# Patient Record
Sex: Female | Born: 1935
Health system: Southern US, Community
[De-identification: ages and names within clinical notes are randomized; demographics above are authoritative.]

## PROBLEM LIST (undated history)

## (undated) ENCOUNTER — Emergency Department (HOSPITAL_COMMUNITY): Admission: EM | Payer: Medicare Other | Source: Home / Self Care

## (undated) DIAGNOSIS — Z950 Presence of cardiac pacemaker: Secondary | ICD-10-CM

## (undated) DIAGNOSIS — I447 Left bundle-branch block, unspecified: Secondary | ICD-10-CM

## (undated) DIAGNOSIS — C50919 Malignant neoplasm of unspecified site of unspecified female breast: Secondary | ICD-10-CM

## (undated) DIAGNOSIS — I8390 Asymptomatic varicose veins of unspecified lower extremity: Secondary | ICD-10-CM

## (undated) DIAGNOSIS — H9190 Unspecified hearing loss, unspecified ear: Secondary | ICD-10-CM

## (undated) DIAGNOSIS — L0390 Cellulitis, unspecified: Secondary | ICD-10-CM

## (undated) DIAGNOSIS — R197 Diarrhea, unspecified: Secondary | ICD-10-CM

## (undated) DIAGNOSIS — E785 Hyperlipidemia, unspecified: Secondary | ICD-10-CM

## (undated) DIAGNOSIS — I509 Heart failure, unspecified: Secondary | ICD-10-CM

## (undated) DIAGNOSIS — I255 Ischemic cardiomyopathy: Secondary | ICD-10-CM

## (undated) DIAGNOSIS — I10 Essential (primary) hypertension: Secondary | ICD-10-CM

## (undated) DIAGNOSIS — R194 Change in bowel habit: Secondary | ICD-10-CM

## (undated) DIAGNOSIS — K635 Polyp of colon: Secondary | ICD-10-CM

## (undated) DIAGNOSIS — T82198A Other mechanical complication of other cardiac electronic device, initial encounter: Secondary | ICD-10-CM

## (undated) DIAGNOSIS — Z95810 Presence of automatic (implantable) cardiac defibrillator: Secondary | ICD-10-CM

## (undated) HISTORY — PX: COLONOSCOPY: SHX174

## (undated) HISTORY — DX: Malignant neoplasm of unspecified site of unspecified female breast: C50.919

## (undated) HISTORY — DX: Hyperlipidemia, unspecified: E78.5

## (undated) HISTORY — DX: Polyp of colon: K63.5

## (undated) HISTORY — DX: Presence of automatic (implantable) cardiac defibrillator: Z95.810

## (undated) HISTORY — DX: Cellulitis, unspecified: L03.90

## (undated) HISTORY — PX: ABDOMINAL HYSTERECTOMY: SHX81

## (undated) HISTORY — DX: Other mechanical complication of other cardiac electronic device, initial encounter: T82.198A

## (undated) HISTORY — PX: CHOLECYSTECTOMY: SHX55

## (undated) HISTORY — PX: TONSILLECTOMY AND ADENOIDECTOMY: SUR1326

## (undated) HISTORY — PX: OTHER SURGICAL HISTORY: SHX169

## (undated) HISTORY — DX: Diarrhea, unspecified: R19.7

## (undated) HISTORY — DX: Change in bowel habit: R19.4

## (undated) HISTORY — DX: Left bundle-branch block, unspecified: I44.7

## (undated) HISTORY — DX: Essential (primary) hypertension: I10

## (undated) HISTORY — DX: Ischemic cardiomyopathy: I25.5

---

## 1998-03-17 ENCOUNTER — Other Ambulatory Visit: Admission: RE | Admit: 1998-03-17 | Discharge: 1998-03-17 | Payer: Self-pay | Admitting: Cardiology

## 1998-10-03 ENCOUNTER — Ambulatory Visit (HOSPITAL_COMMUNITY): Admission: RE | Admit: 1998-10-03 | Discharge: 1998-10-03 | Payer: Self-pay | Admitting: Ophthalmology

## 1999-08-24 ENCOUNTER — Encounter (HOSPITAL_COMMUNITY): Admission: RE | Admit: 1999-08-24 | Discharge: 1999-11-22 | Payer: Self-pay | Admitting: Cardiology

## 1999-11-23 ENCOUNTER — Encounter (HOSPITAL_COMMUNITY): Admission: RE | Admit: 1999-11-23 | Discharge: 2000-02-21 | Payer: Self-pay | Admitting: Cardiology

## 2000-02-22 ENCOUNTER — Encounter (HOSPITAL_COMMUNITY): Admission: RE | Admit: 2000-02-22 | Discharge: 2000-05-22 | Payer: Self-pay | Admitting: Cardiology

## 2000-05-23 ENCOUNTER — Encounter (HOSPITAL_COMMUNITY): Admission: RE | Admit: 2000-05-23 | Discharge: 2000-08-21 | Payer: Self-pay | Admitting: Cardiology

## 2000-08-22 ENCOUNTER — Encounter (HOSPITAL_COMMUNITY): Admission: RE | Admit: 2000-08-22 | Discharge: 2000-11-20 | Payer: Self-pay | Admitting: Cardiology

## 2000-11-21 ENCOUNTER — Encounter (HOSPITAL_COMMUNITY): Admission: RE | Admit: 2000-11-21 | Discharge: 2001-02-19 | Payer: Self-pay | Admitting: Cardiology

## 2001-02-20 ENCOUNTER — Encounter (HOSPITAL_COMMUNITY): Admission: RE | Admit: 2001-02-20 | Discharge: 2001-05-21 | Payer: Self-pay | Admitting: Cardiology

## 2001-03-29 ENCOUNTER — Encounter: Admission: RE | Admit: 2001-03-29 | Discharge: 2001-06-27 | Payer: Self-pay | Admitting: Internal Medicine

## 2001-05-22 ENCOUNTER — Encounter (HOSPITAL_COMMUNITY): Admission: RE | Admit: 2001-05-22 | Discharge: 2001-08-20 | Payer: Self-pay | Admitting: Cardiology

## 2001-09-01 ENCOUNTER — Encounter (HOSPITAL_COMMUNITY): Admission: RE | Admit: 2001-09-01 | Discharge: 2001-11-30 | Payer: Self-pay | Admitting: Cardiology

## 2001-11-06 ENCOUNTER — Ambulatory Visit (HOSPITAL_COMMUNITY): Admission: RE | Admit: 2001-11-06 | Discharge: 2001-11-06 | Payer: Self-pay | Admitting: Ophthalmology

## 2001-11-06 ENCOUNTER — Encounter: Payer: Self-pay | Admitting: Ophthalmology

## 2001-12-02 ENCOUNTER — Encounter (HOSPITAL_COMMUNITY): Admission: RE | Admit: 2001-12-02 | Discharge: 2002-03-02 | Payer: Self-pay | Admitting: Cardiology

## 2002-03-03 ENCOUNTER — Encounter (HOSPITAL_COMMUNITY): Admission: RE | Admit: 2002-03-03 | Discharge: 2002-06-01 | Payer: Self-pay | Admitting: Cardiology

## 2002-08-07 ENCOUNTER — Ambulatory Visit (HOSPITAL_COMMUNITY): Admission: RE | Admit: 2002-08-07 | Discharge: 2002-08-08 | Payer: Self-pay | Admitting: Cardiology

## 2002-08-08 ENCOUNTER — Encounter: Payer: Self-pay | Admitting: Cardiology

## 2002-12-03 ENCOUNTER — Encounter: Payer: Self-pay | Admitting: Cardiology

## 2002-12-03 ENCOUNTER — Inpatient Hospital Stay (HOSPITAL_COMMUNITY): Admission: AD | Admit: 2002-12-03 | Discharge: 2002-12-12 | Payer: Self-pay | Admitting: Cardiology

## 2002-12-05 ENCOUNTER — Encounter: Payer: Self-pay | Admitting: Cardiology

## 2002-12-06 ENCOUNTER — Encounter: Payer: Self-pay | Admitting: Cardiology

## 2002-12-10 ENCOUNTER — Encounter: Payer: Self-pay | Admitting: Cardiology

## 2003-02-03 ENCOUNTER — Encounter: Payer: Self-pay | Admitting: Internal Medicine

## 2003-02-03 ENCOUNTER — Inpatient Hospital Stay (HOSPITAL_COMMUNITY): Admission: EM | Admit: 2003-02-03 | Discharge: 2003-02-05 | Payer: Self-pay | Admitting: Emergency Medicine

## 2004-10-06 ENCOUNTER — Ambulatory Visit: Payer: Self-pay | Admitting: Internal Medicine

## 2004-11-01 DIAGNOSIS — I428 Other cardiomyopathies: Secondary | ICD-10-CM

## 2004-11-01 HISTORY — DX: Other cardiomyopathies: I42.8

## 2004-11-11 ENCOUNTER — Inpatient Hospital Stay (HOSPITAL_BASED_OUTPATIENT_CLINIC_OR_DEPARTMENT_OTHER): Admission: RE | Admit: 2004-11-11 | Discharge: 2004-11-11 | Payer: Self-pay | Admitting: Cardiology

## 2004-11-25 ENCOUNTER — Ambulatory Visit: Payer: Self-pay | Admitting: Internal Medicine

## 2004-12-03 ENCOUNTER — Ambulatory Visit (HOSPITAL_COMMUNITY): Admission: RE | Admit: 2004-12-03 | Discharge: 2004-12-03 | Payer: Self-pay | Admitting: Internal Medicine

## 2004-12-22 ENCOUNTER — Ambulatory Visit: Payer: Self-pay | Admitting: Internal Medicine

## 2004-12-28 ENCOUNTER — Inpatient Hospital Stay (HOSPITAL_COMMUNITY): Admission: RE | Admit: 2004-12-28 | Discharge: 2004-12-29 | Payer: Self-pay | Admitting: Internal Medicine

## 2004-12-28 ENCOUNTER — Ambulatory Visit: Payer: Self-pay | Admitting: Internal Medicine

## 2004-12-30 ENCOUNTER — Ambulatory Visit: Payer: Self-pay

## 2005-01-01 ENCOUNTER — Ambulatory Visit: Payer: Self-pay | Admitting: Cardiology

## 2005-01-11 ENCOUNTER — Ambulatory Visit: Payer: Self-pay | Admitting: Internal Medicine

## 2005-01-11 ENCOUNTER — Ambulatory Visit: Payer: Self-pay

## 2005-02-03 ENCOUNTER — Ambulatory Visit: Payer: Self-pay

## 2005-02-16 ENCOUNTER — Ambulatory Visit: Payer: Self-pay | Admitting: Internal Medicine

## 2005-03-24 ENCOUNTER — Ambulatory Visit: Payer: Self-pay | Admitting: Internal Medicine

## 2005-04-30 ENCOUNTER — Ambulatory Visit: Payer: Self-pay | Admitting: Internal Medicine

## 2005-08-05 ENCOUNTER — Encounter (INDEPENDENT_AMBULATORY_CARE_PROVIDER_SITE_OTHER): Payer: Self-pay | Admitting: Specialist

## 2005-08-05 ENCOUNTER — Ambulatory Visit (HOSPITAL_COMMUNITY): Admission: RE | Admit: 2005-08-05 | Discharge: 2005-08-05 | Payer: Self-pay | Admitting: Gastroenterology

## 2005-12-10 ENCOUNTER — Ambulatory Visit: Payer: Self-pay | Admitting: Internal Medicine

## 2005-12-20 ENCOUNTER — Ambulatory Visit: Payer: Self-pay

## 2005-12-27 ENCOUNTER — Ambulatory Visit: Payer: Self-pay | Admitting: Internal Medicine

## 2005-12-27 ENCOUNTER — Inpatient Hospital Stay (HOSPITAL_COMMUNITY): Admission: RE | Admit: 2005-12-27 | Discharge: 2005-12-27 | Payer: Self-pay | Admitting: Internal Medicine

## 2006-01-05 ENCOUNTER — Ambulatory Visit: Payer: Self-pay

## 2006-05-10 ENCOUNTER — Ambulatory Visit: Payer: Self-pay | Admitting: Internal Medicine

## 2007-05-02 ENCOUNTER — Ambulatory Visit: Payer: Self-pay | Admitting: Internal Medicine

## 2007-08-10 ENCOUNTER — Ambulatory Visit: Payer: Self-pay | Admitting: Internal Medicine

## 2008-04-12 ENCOUNTER — Encounter: Payer: Self-pay | Admitting: Cardiology

## 2008-04-19 ENCOUNTER — Ambulatory Visit: Payer: Self-pay | Admitting: Internal Medicine

## 2008-04-19 LAB — CONVERTED CEMR LAB
BUN: 24 mg/dL — ABNORMAL HIGH (ref 6–23)
Basophils Relative: 0.3 % (ref 0.0–1.0)
CO2: 27 meq/L (ref 19–32)
Creatinine, Ser: 0.8 mg/dL (ref 0.4–1.2)
Eosinophils Relative: 3.8 % (ref 0.0–5.0)
GFR calc Af Amer: 89 mL/min
GFR calc non Af Amer: 74 mL/min
Glucose, Bld: 149 mg/dL — ABNORMAL HIGH (ref 70–99)
HCT: 37 % (ref 36.0–46.0)
Hemoglobin: 13 g/dL (ref 12.0–15.0)
MCHC: 35 g/dL (ref 30.0–36.0)
MCV: 92.7 fL (ref 78.0–100.0)
Monocytes Absolute: 0.4 10*3/uL (ref 0.1–1.0)
Neutrophils Relative %: 67.7 % (ref 43.0–77.0)
Platelets: 210 10*3/uL (ref 150–400)
aPTT: 29.3 s (ref 21.7–29.8)

## 2008-04-30 ENCOUNTER — Ambulatory Visit (HOSPITAL_COMMUNITY): Admission: AD | Admit: 2008-04-30 | Discharge: 2008-04-30 | Payer: Self-pay | Admitting: Internal Medicine

## 2008-04-30 ENCOUNTER — Ambulatory Visit: Payer: Self-pay | Admitting: Internal Medicine

## 2008-05-22 ENCOUNTER — Ambulatory Visit: Payer: Self-pay

## 2008-08-22 ENCOUNTER — Ambulatory Visit: Payer: Self-pay | Admitting: Internal Medicine

## 2008-08-22 HISTORY — PX: OTHER SURGICAL HISTORY: SHX169

## 2009-03-18 ENCOUNTER — Encounter: Payer: Self-pay | Admitting: Internal Medicine

## 2010-08-17 ENCOUNTER — Ambulatory Visit: Payer: Self-pay | Admitting: Cardiology

## 2010-11-20 ENCOUNTER — Ambulatory Visit: Payer: Self-pay | Admitting: Cardiology

## 2010-12-01 ENCOUNTER — Telehealth (INDEPENDENT_AMBULATORY_CARE_PROVIDER_SITE_OTHER): Payer: Self-pay | Admitting: *Deleted

## 2010-12-01 ENCOUNTER — Ambulatory Visit
Admission: RE | Admit: 2010-12-01 | Discharge: 2010-12-01 | Payer: Self-pay | Source: Home / Self Care | Attending: Internal Medicine | Admitting: Internal Medicine

## 2010-12-01 DIAGNOSIS — I5022 Chronic systolic (congestive) heart failure: Secondary | ICD-10-CM | POA: Insufficient documentation

## 2010-12-01 DIAGNOSIS — I428 Other cardiomyopathies: Secondary | ICD-10-CM | POA: Insufficient documentation

## 2010-12-09 NOTE — Progress Notes (Signed)
  Phone Note Call from Patient   Caller: Patient Call For: NURSE Summary of Call: PT CALLED AND CONFIRMED DOSAGE OF AMLODIPINE 5 MG 1 once daily AND STATED OTHER MEDS WERE LISTED CORRECTLY Initial call taken by: Scherrie Bateman, LPN,  December 01, 2010 10:42 AM

## 2010-12-09 NOTE — Assessment & Plan Note (Signed)
Summary: icd check/medtronic   Visit Type:  Follow-up  CC:  no complaints.  History of Present Illness:   [name]  is seen in followup for nonischemic cardiomyopathy complicated by congestive heart failure. She is status post CRT-D implantation was further compromised by a 6949-lead which failed prompting [place] to pirate  her previously implanted pacemaker lead  . This was done in 2007  She has had a modest change in her breathing, needing to take deep breaths and with a lump in her throat.    The patient denies SOB, chest pain, edema or palpitations   Current Medications (verified): 1)  Janumet 50-500 Mg Tabs (Sitagliptin-Metformin Hcl) .Marland Kitchen.. 1 Tab Twice Daily 2)  Klor-Con 10 10 Meq Cr-Tabs (Potassium Chloride) .... As Needed 3)  Lipitor 20 Mg Tabs (Atorvastatin Calcium) .... Daily 4)  Furosemide 20 Mg Tabs (Furosemide) .... As Needed 5)  Diovan 320 Mg Tabs (Valsartan) .... Daily 6)  Metoprolol Succinate 100 Mg Xr24h-Tab (Metoprolol Succinate) .Marland Kitchen.. 1 Tab By Mouth Twice Daily 7)  Amlodipine Besylate 5 Mg Tabs (Amlodipine Besylate) .... Daily 8)  Gemfibrozil 600 Mg Tabs (Gemfibrozil) .Marland Kitchen.. 1 Tab By Mouth Daily 9)  Mag-Tab Sr 84 Mg ( ) Cr-Tabs (Magnesium Lactate) .... Daily 10)  Aspirin 325 Mg Tabs (Aspirin) .... As Needed 11)  Centrum Silver  Tabs (Multiple Vitamins-Minerals) .... Daily 12)  Hyoscyamine Sulfate 0.125 Mg/73ml Elix (Hyoscyamine Sulfate) .... As Needed 13)  Cholestyramine  Powd (Cholestyramine) .... As Needed 14)  B12 .... Daily  Allergies (verified): 1)  ! Coreg 2)  ! * Antibiotics  Past History:  Past Medical History: Last updated: 12/05/2010 Ischemic Cardiomyopathy Functioning biventricular ICD Diabetes Hypertension Dyslipidemia Ulcerative Colitis Meniere's disease Chronic left bundle branch block  Past Surgical History: Last updated: Dec 05, 2010 Cardiac Catheterization Pacemaker Abdominal Hysterectomy T&A  Family History: Last updated:  12-05-2010 Father: died at age 60 with emphysema Mother: died at age 71 with cancer  Social History: Last updated: 12/05/10 Widowed, has 2 children Tobacco Use - Former. stopped smoking in 1970  Risk Factors: Smoking Status: quit (December 05, 2010)  Vital Signs:  Patient profile:   77 year old female Height:      65 inches Weight:      138.50 pounds BMI:     23.13 Pulse rate:   83 / minute BP sitting:   148 / 81  (right arm) Cuff size:   regular  Vitals Entered By: Micki Riley CNA (December 01, 2010 9:39 AM)  Physical Exam  General:  The patient was alert and oriented in no acute distress. HEENT Normal.  Neck veins were flat, carotids were brisk.  Lungs were clear.  Heart sounds were regular without murmurs or gallops.  Abdomen was soft with active bowel sounds. There is no clubbing cyanosis or edema. Skin Warm and dry     ICD Specifications Following MD:  Sherryl Manges, MD     Referring MD:  Roger Shelter, MD ICD Vendor:  Medtronic     ICD Model Number:  [id number]     ICD Serial Number:  [id number] ICD DOI:  04/30/2008     ICD Implanting MD:  Sherryl Manges, MD  Lead 1:    Location: RA     DOI: 08/07/2002     Model #: [id number]     Serial #: [id number]     Status: active Lead 2:    Location: RV     DOI: 12/28/2004     Model #: [id number]     Serial #:  [id number]     Status: active Lead 3:    Location: RV     DOI: 08/07/2002     Model #: 4470     Serial #: [id number]     Status: active Lead 4:    Location: LV     DOI: 08/07/2002     Model #: [id number]     Serial #: [id number]     Status: active  Indications::  NICM   ICD Follow Up Battery Voltage:  3.06 V     Charge Time:  9.3 seconds     Underlying rhythm:  SR @ 74   ICD Device Measurements Atrium:  Amplitude: 1.6 mV, Impedance: 552 ohms, Threshold: 1.0 V at 0.4 msec Right Ventricle:  Amplitude: 17.6 mV, Impedance: 408 ohms, Threshold: 1.0 V at 0.6 msec Left Ventricle:  Impedance: 456 ohms, Threshold: 1.0 V at 0.4 msec  Episodes MS  Episodes:  0     Shock:  0     ATP:  0     Nonsustained:  8     Atrial Therapies:  0 Atrial Pacing:  10%     Ventricular Pacing:  99.9%  Brady Parameters Mode DDDR     Lower Rate Limit:  60     Upper Rate Limit 120 PAV 130     Sensed AV Delay:  110  Tachy Zones VF:  >200     VT:  200-250     VT1:  130-200     Next Remote Date:  03/04/2011     Next Cardiology Appt Due:  11/02/2011 Tech Comments:  8 NST EPISODES.  NORMAL DEVICE FUNCTION.  CHANGED RV OUTPUT FROM 3.0 TO 2.5 V AND TURNED ON 1:1 SVT.  CHANGED MAX LEAD IMPEDANCES FOR LIA. OPTIVOL STABLE. DEMONSTRATED TONES FOR PT. CARELINK 03-04-11 AND ROV IN 12 MTHS W/SK. Vella Kohler  December 01, 2010 9:53 AM  Impression & Recommendations:  Problem # 1:  CARDIOMYOPATHY, PRIMARY, DILATED (ICD-425.4)  stable on current medications  Her updated medication list for this problem includes:    Furosemide 20 Mg Tabs (Furosemide) .Marland Kitchen... As needed    Diovan 320 Mg Tabs (Valsartan) .Marland Kitchen... Daily    Metoprolol Succinate 100 Mg Xr24h-tab (Metoprolol succinate) .Marland Kitchen... 1 tab by mouth twice daily    Amlodipine Besylate 5 Mg Tabs (Amlodipine besylate) .Marland Kitchen... Daily    Aspirin 325 Mg Tabs (Aspirin) .Marland Kitchen... As needed  Problem # 2:  SYSTOLIC HEART FAILURE, CHRONIC (ICD-428.22)  continue current medications; she is advised to let us know if the breathing issues get worse. There is a small increase in her optivol reading but has not crossed threshold. We will resume her p.r.n. diuretic in the event that it does so.  Her updated medication list for this problem includes:    Furosemide 20 Mg Tabs (Furosemide) .Marland Kitchen... As needed    Diovan 320 Mg Tabs (Valsartan) .Marland Kitchen... Daily    Metoprolol Succinate 100 Mg Xr24h-tab (Metoprolol succinate) .Marland Kitchen... 1 tab by mouth twice daily    Amlodipine Besylate 5 Mg Tabs (Amlodipine besylate) .Marland Kitchen... Daily    Aspirin 325 Mg Tabs (Aspirin) .Marland Kitchen... As needed  Problem # 3:  MEDTRONIC CRT (ICD-V45.02) Device parameters and data were reviewed and  no changes were made  Problem # 4:  6949 WITH PIRATED PM R/S LEAD; RETAINED HV COILS (ICD-996.04) she is reminded to alert [place] if her device starts beeping. The lead integrity alert will advise [place] if she has structural issues with the retained portiona of  high voltage system of her 6949-lead.  Patient Instructions: 1)  Your physician wants you to follow-up in:  YEAR WITH DR Graciela Husbands AND 03/04/11 WITH Melburn Hake You will receive a reminder letter in the mail two months in advance. If you don't receive a letter, please call our office to schedule the follow-up appointment.

## 2011-03-04 ENCOUNTER — Ambulatory Visit (INDEPENDENT_AMBULATORY_CARE_PROVIDER_SITE_OTHER): Payer: Medicare PPO | Admitting: *Deleted

## 2011-03-04 DIAGNOSIS — I5022 Chronic systolic (congestive) heart failure: Secondary | ICD-10-CM

## 2011-03-04 DIAGNOSIS — I428 Other cardiomyopathies: Secondary | ICD-10-CM

## 2011-03-07 ENCOUNTER — Other Ambulatory Visit: Payer: Self-pay

## 2011-03-07 ENCOUNTER — Encounter: Payer: Self-pay | Admitting: *Deleted

## 2011-03-11 NOTE — Progress Notes (Signed)
icd remote w/icm  

## 2011-03-16 NOTE — Letter (Signed)
April 19, 2008    Colleen Can. Deborah Chalk, M.D.  [address], [address]  [place], [place] [address]   RE:  Grace Robinson, Grace Robinson  MRN:  [id number]  /  DOB:  07-Nov-1925   Dear Duffy Rhody,   It was a pleasure to see Romonda Parker at your request as she has reached  ERI of her previously implanted ICD.   As you know, she has a remote history of a pacemaker implanted about 4-5  years ago.  She then underwent CRTD upgrade in 2003 with the insertion  of a 6949 Medtronic lead.  We then ended up having failure of the rate  sensed portion and I do not know that we appreciated this early on in  6949 history and we revised her system pivoting her previously implanted  right ventricular rate sense lead and she has done well with that since  then.  She remains class II.  She is able to get around and do most of  what she wants.  She has little bit of limitations of fatigue and  shortness of breath.  As you recall, she has a nonischemic myopathy.  She also has diabetes and hypertension.   Her medication list is legion and includes:  1. Lipitor 20.  2. Diovan 320.  3. Amlodipine 10.  4. Metoprolol succinate 100 b.i.d.  5. Metformin 500 b.i.d.  6. Furosemide.  7. Gemfibrozil.  8. Asacol.  9. Aspirin.   ALLERGIES:  VANCOMYCIN.   On examination, she is a stately elderly female appearing her stated age  of 72.  Her blood pressure is 135/65, her pulse is 68, her weight is  133, which is down a few pounds.  Her HEENT exam demonstrates no icterus  or xanthoma.  The neck veins were flat.  The carotids were brisk and  full bilaterally without bruits.  The back was without kyphosis or  scoliosis.  Lungs were clear.  Heart sounds were regular without murmurs  or gallops.  The abdomen was soft with active bowel sounds.  Extremities  were without edema.  Neurological exam was grossly normal.  The skin was  warm and dry.   Interrogation of her previously implanted Medtronic ICD demonstrates  presence of the  aforementioned Guidant leads of 4469 in the atrium and  4470 in the ventricle.  She has a 4193 CRT lead and a Medtronic InSync  Sentry [id number].  Her battery voltage is 2.62.   IMPRESSION:  1. Nonischemic cardiomyopathy.  2. Chronic systolic congestive heart failure.  3. Status post cardiac resynchronization therapy device and      defibrillator for the above with:      a.     Originally implanted dual-chamber pacemaker.      b.     Implantation of cardiac resynchronization therapy device and       defibrillator.      c.     Fracture of the rate sensed portion of the 6949 lead with       pivoting of the previously implanted RV pacing lead.      d.     Elective replacement indicator.  4. Hypertension.  5. Diabetes.  6. VANCOMYCIN - Redman reaction.   Weyman Croon, Ms. Abplanalp has reached ERI on her CRTD.  We have discussed the  potential benefits as well as potential risks of the device generator  explantation and reimplantation.  She understands these risks including  death, perforation, and the possibility of infection and is willing to  proceed.  We will plan at the time of device generator replacement to  extricate some of the scar tissue based on some information that was  presented at the [facility] meetings this last year.  She understands the  potential risk of infection may be as high as 45%.    Sincerely,      Duke Salvia, MD, [facility]  Electronically Signed    SCK/MedQ  DD: 04/19/2008  DT: 04/19/2008  Job #: [id number]

## 2011-03-16 NOTE — Assessment & Plan Note (Signed)
[facility]                         ELECTROPHYSIOLOGY OFFICE NOTE   CYNDIA, DEGRAFF                       MRN:          [id number]  DATE:05/02/2007                            DOB:          [birth year]    HISTORY:  Ms. Nilson is seen.  She is status post CRT implantation for  non-ischemic heart disease.  She continues to feel very well without  complaints of chest pain or shortness of breath.  She is able to do her  activities of daily living.   CURRENT MEDICATIONS:  1. Toprol 150 mg.  2. Amlodipine 2.5 mg.  3. Metformin 100 mg b.i.d.  4. Diovan 320 mg.  5. Lipitor.   PHYSICAL EXAMINATION:  VITAL SIGNS:  Blood pressure 139/79, pulse 62.  LUNGS:  Clear.  HEART:  Sounds regular.  EXTREMITIES:  Without edema.   Interrogation of her Medtronic Centry ICD demonstrates a P-wave of 1.7  with impedance of 496, threshold 1 volt at 0.2.  The R-wave 20.2,  impedance 360, threshold of 1.5 at 0.3.  LV impedance was 392, threshold  of 1 volt at 0.2.  Battery voltage 2.9.  There are no inter-current  therapies.  There are a number of non-sustained episodes.  She is 100%  ventricularly paced.   IMPRESSION:  1. Non-ischemic cardiomyopathy.  2. Class 3, now class 2 congestive heart failure.  3. Status post CRT implantable cardioverter defibrillator for number      one.  4. Exit block related to previously-implanted defibrillator rate sense      portion with pirating of her previously-implanted pacemaker lead.  5. Is #6949 lead.   Ms. Miles is doing well.  We have demonstrated for her, her tones for  alarm, in case her [phone] has a problem.  We will see her again in one  year's time.  She will continue trans-telephonic monitoring in the  interim.     Duke Salvia, MD, [facility]  Electronically Signed    SCK/MedQ  DD: 05/02/2007  DT: 05/02/2007  Job #: [id number]   cc:   Colleen Can. Deborah Chalk, M.D.

## 2011-03-16 NOTE — Assessment & Plan Note (Signed)
[facility]                         ELECTROPHYSIOLOGY OFFICE NOTE   TERESINA, BUGAJ                       MRN:          [id number]  DATE:08/22/2008                            DOB:          Nov 09, 1925    [name] is seen following CRT defibrillator generator replacement  accomplished in June.  She does have a pirated previously implanted  ventricular paced sense lead as her 6949 lead had problems about 3 years  ago.   She has no complaints of chest pain or shortness of breath.   Her medications are unchanged, except for the intercurrent addition of  med Carafate.   PHYSICAL EXAMINATION:  VITAL SIGNS:  Her blood pressure is 120/70, her  pulse was 70, and weight was 131, stable.  LUNGS:  Clear.  NECK:  Veins were flat.  HEART:  Sounds were regular.  Device pocket was well healed.  EXTREMITIES:  No edema.   Interrogation of her Medtronic device demonstrates a P-wave of 1.9 with  impedance of 40, the threshold 1 volt at 0.4 in the RA and LV, the RV  impedance was 384, the threshold 0.5 at 0.4, the LV impedance was 416,  and the R-wave was 19.  Battery voltage is 3.2.  There is no  intercurrent episodes.  There is 2 episodes of nonsustained ventricular  tachycardia identified.  No therapies were delivered.   IMPRESSION:  1. Nonischemic cardiomyopathy.  2. Congestive heart failure - chronic - systolic.  3. Status post cardiac resynchronization therapy device and      defibrillator for the above with recent generator replacement.  4. Previous pirating of the rate sense lead for the 6949 lead.   [name] is stable.  We will see her again at the request of Dr.  Reyes Robinson and Grace Robinson.  No followup will be scheduled.     Duke Salvia, MD, [facility]  Electronically Signed    SCK/MedQ  DD: 08/22/2008  DT: 08/22/2008  Job #: [id number]   cc:   Grace Robinson. Grace Robinson, M.D.

## 2011-03-19 NOTE — Op Note (Signed)
[place]  Patient:    Grace Robinson, Grace Robinson Visit Number: [id number] MRN: [id number]          Service Type: DSU Location: [facility] 2857 01 Attending Physician:  Ivor Messier Dictated by:   Guadelupe Sabin, M.D. Proc. Date: 11/07/01 Admit Date:  11/06/2001 Discharge Date: 11/06/2001   CC:         Gordy Savers, M.D. [facility] Deborah Chalk, M.D.   Operative Report  PREOPERATIVE DIAGNOSIS:  Senile nuclear cataract, right eye.  POSTOPERATIVE DIAGNOSIS:  Senile nuclear cataract, right eye.  OPERATIONS: 1. Planned extracapsular cataract extraction. 2. Phacoemulsification. 3. Primary insertion of posterior chamber intraocular lens implant.  SURGEON:  Guadelupe Sabin, M.D.  ASSISTANT:  Nurse.  ANESTHESIA:  Local 4% Xylocaine and 0.75 Marcaine.  Anesthesia standby required.  The patient was given sodium pentothal intravenously during the period of retrobulbar injection.  DESCRIPTION OF PROCEDURE:  After the patient was prepped and draped, a lid speculum was inserted in the left eye.  The eye was turned downward and a superior rectus traction suture placed.  Schiotz tonometry was recorded at 5-6 scale units with a 5.5 g weight.  A peritomy was performed adjacent to the limbus from the 11 to 1 oclock position.  The corneoscleral junction was cleaned and a corneoscleral groove made with a 45 degree Superblade.  The anterior chamber was then entered with a 2.5 mm diamond keratome at the 12 oclock position and the 15 degree blade at the 2:30 position.  Using a bent 26 gauge needle on a Healon syringe, a circular capsulorrhexis was begun and then completed with the Grabow forceps.  Hydrodissection and hydrodelineation were performed using 1% Xylocaine.  The 30 degree phacoemulsification tip was then inserted with slow controlled emulsification of the lens nucleus.  Total ultrasonic time 44 seconds.  Average power level 16%.  Total  amount of fluid 75 cc.  Following removal of the nucleus, the residual cortex was aspirated with the irrigation-aspiration tip.  The posterior capsule appeared intact with a brilliant red fundus reflex.  It was therefore elected to insert an [employer] Opt[employer] [id number] silicone three-piece posterior chamber intraocular lens implant with UV absorber, diopter strength +20.00.  This was inserted with the McDonald forceps into the anterior chamber and then centered into the capsular bag using the [facility] lens rotator.  The lens appeared to be well centered.  The Healon which had been using during the procedure was then aspirated and replaced with balanced salt solution and Miochol ophthalmic solution.  The operative incisions appeared to be self-sealing and no sutures were required.  Maxitrol ointment was instilled in the conjunctival cul-de-sac and a light patch and protective shield applied.  The duration of the procedure and anesthesia administration was 45 minutes.  The patient tolerated the procedure well in general and left the operating room for the recovery room in good condition. Dictated by:   Guadelupe Sabin, M.D. Attending Physician:  Ivor Messier DD:  11/07/01 TD:  11/07/01 Job: [id number]

## 2011-03-19 NOTE — H&P (Signed)
[place]  Patient:    Grace Robinson, Grace Robinson Visit Number: [id number] MRN: [id number]          Service Type: DSU Location: [facility] 2857 01 Attending Physician:  Ivor Messier Dictated by:   Guadelupe Sabin, M.D. Admit Date:  11/06/2001 Discharge Date: 11/06/2001   CC:         Gordy Savers, M.D. [facility]. Deborah Chalk, M.D.   History and Physical  HISTORY OF PRESENT ILLNESS:  This was a planned outpatient readmission of this 77 year old white female admitted for cataract implant surgery of the right eye.  This patient has noted deterioration of vision due to progressive cataract formation in both eyes.  She was previously admitted on October 03, 1989, and had an uncomplicated cataract implant procedure performed on the left eye. Recently, however, the patient has deteriorated in the right eye to 20/70 with best correction and the patient has elected to proceed with similar surgery of the right eye.  PAST MEDICAL HISTORY:  The patient is under the care of Gordy Savers, M.D., for her general medical health and Colleen Can. Deborah Chalk, M.D., for cardiology.  She has chronic congestive heart failure which is under control at the present time.  The patient also has Menieres disease with some residual vertigo.  The patient is a widow.  She lives alone.  She is a nonsmoker.  She is diabetic, non-insulin-dependent type, which was diagnosed in May of 2002.  CURRENT MEDICATIONS: 1. Aspirin for one week which she has discontinued for one week prior to    admission. 2. Lanoxin. 3. Zestril. 4. GlucoVance. 5. Lipitor. 6. Vitamins.  REVIEW OF SYSTEMS:  No current cardiorespiratory complaints.  PHYSICAL EXAMINATION:  Vital signs as recorded on admission, temperature 97.8 degrees, pulse 71, respirations 18, and blood pressure 137/73.  GENERAL APPEARANCE:  The patient is a pleasant, well-nourished, well-developed, white female in no  acute ocular distress.  HEENT:  Eyes:  Visual acuity as noted above.  Applanation tonometry 14 mm in each eye.  On external ocular and slit lamp examination, the eyes are white and clear with a clear cornea, deep and clear anterior chamber.  A nuclear sclerotic cataract is present in the right eye and a well-centered, clear posterior chamber intraocular lens implant in the left eye with an open posterior capsule following YAG laser capsulotomy.  Detailed fundus examination with indirect ophthalmoscopy reveals a clear vitreous and attached retina.  The blood vessels show no diabetic retinopathy, but the macula and optic nerve are normal.  CHEST:  Lungs clear to auscultation and percussion.  HEART:  Normal sinus rhythm.  No cardiomegaly.  No murmurs.  ABDOMEN:  Negative.  EXTREMITIES:  Negative.  ADMISSION DIAGNOSES: 1. Senile cataract, right eye. 2. Pseudophakia, left eye.  SURGICAL PLAN:  Cataract implant surgery, right eye, under local anesthesia. Dictated by:   Guadelupe Sabin, M.D. Attending Physician:  Ivor Messier DD:  11/07/01 TD:  11/07/01 Job: [id number]

## 2011-03-19 NOTE — Op Note (Signed)
NAMEDENELDA, AKERLEY                ACCOUNT NO.:  [id number]   MEDICAL RECORD NO.:  [id number]          PATIENT TYPE:  AMB   LOCATION:  ENDO                         FACILITY:  [facility]   PHYSICIAN:  James L. Malon Kindle., M.D.DATE OF BIRTH:  09-Jun-1926   DATE OF PROCEDURE:  08/05/2005  DATE OF DISCHARGE:                                 OPERATIVE REPORT   PROCEDURE:  Colonoscopy with biopsy.   MEDICATIONS:  Fentanyl 65 mcg, Versed 6 mg IV.   SCOPE:  Olympus pediatric colonoscope.   INDICATIONS:  A 77 year old woman with no previous history of colonic  screening.  She has had persistent diarrhea. This is to evaluate her colon.   DESCRIPTION OF PROCEDURE:  The procedure had been explained to the patient  and consent obtained.  She has an implantable defibrillator.  The  defibrillator was disabled by the repair prior to the procedure.  The scope  was inserted and advanced.  She had a long, tortuous colon.  The prep was  excellent.  Using multiple position changes including some supine and right  lateral decubitus positions and abdominal pressure, we were finally able to  reach the cecum.  The appendiceal orifice was seen and identified.  The  scope was withdrawn and the colon carefully examined. The mucosal pattern  throughout was totally normal with no ulceration and no gross colitis.  No  polyps were seen.  There was no diverticulosis.  She did have a quite long,  tortuous colon.  The scope was withdrawn. The patient tolerated the  procedure well. There were no immediate complications.   ASSESSMENT:  Diarrhea, probably functional. 674.5.   PLAN:  Will need to check pathology to rule out microscopic colitis.  Will  see back in the office in 2-4 weeks.  Go ahead and turn on the defibrillator  at this time.           ______________________________  Llana Aliment Malon Kindle., M.D.     Waldron Session  D:  08/05/2005  T:  08/05/2005  Job:  [id number]   cc:   Colleen Can. Deborah Chalk, M.D.  Fax:  [phone]

## 2011-03-19 NOTE — H&P (Signed)
NAME:  Grace Robinson, Grace Robinson                          ACCOUNT NO.:  [id number]   MEDICAL RECORD NO.:  [id number]                   PATIENT TYPE:  EMS   LOCATION:  ED                                   FACILITY:  [facility]   PHYSICIAN:  Valetta Mole. Swords, M.D. [facility]           DATE OF BIRTH:  08-11-1926   DATE OF ADMISSION:  02/03/2003  DATE OF DISCHARGE:                                HISTORY & PHYSICAL   CHIEF COMPLAINT:  Facial pain.   HISTORY OF PRESENT ILLNESS:  The patient is a 77 year old female with a  complicated medical history.  Three days ago she noted a sore area on her  left scalp, tenderness increased over the next few days.  She noticed a  pimple on her forehead yesterday, was [facility] and  apparently treated with Keflex.  Overnight the area of erythema and pain has  increased.  She went back to the Urgent Care Center and was referred to me  and I asked her to come to the emergency department.  She denies any fevers  or chills.  She denies any nasal pain, any eye pain.  She denies any change  in vision.  She denies any jaw pain, rhinorrhea, or cough.   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  She is on long-term prednisone because of that.  She is also a  type 2 diabetic.  She also has a history of pericarditis related to her  viral cardiomyopathy.   PAST SURGICAL HISTORY:  Includes cholecystectomy, T&A, permanent pacemaker  placement, mastoid surgery (some sort of shunt was placed into the inner  ear.   FAMILY HISTORY:  Noncontributory.   SOCIAL HISTORY:  She lives alone.  She is a nonsmoker, does not drink  alcohol.   CURRENT MEDICATIONS:  1. Prednisone 10 mg p.o. daily.  2. Toprol-XL 25 mg p.o. daily.  3. Lanoxin 0.125 mg p.o. daily (exact dose not quite known).  4. Gemfibrozil 600 mg p.o. b.i.d.  5. Glucovance 5/500 mg one p.o. b.i.d.  6. Aspirin one daily.  7. Diovan 150 mg p.o. daily.   REVIEW OF  SYSTEMS:  She has noted some facial swelling extending to the  periorbital area along with erythema.  She denies any other complaints in  the review of systems including chest pain, shortness of breath, PND,  abdominal pain.   PHYSICAL EXAMINATION:  VITAL SIGNS:  Temperature 98, pulse 70, respirations  14.  GENERAL:  She appears a well-developed, thin white female in no acute  distress.  HEENT:  Atraumatic, normocephalic.  She has a boil upper left forehead.  There is some spontaneous purulent drainage from the boil.  There is an area  fluctuant that appears approximately 2 cm around the boil.  Erythema extends  from mid scalp down to bilateral periorbital areas.  NECK:  Supple without lymphadenopathy,  thyromegaly, jugular venous  distention, or carotid bruits.  CHEST:  Clear to auscultation.  CARDIAC:  S1 & S2 are regular rate without gallop.  She does have a 2/6  holosystolic murmur at the apex.  ABDOMEN:  Active bowel sounds, soft, nontender.  There is no  hepatosplenomegaly.  EXTREMITIES:  There is no clubbing, cyanosis, or edema.  NEUROLOGICAL:  She is alert and oriented without any motor or sensory  deficits.   LABORATORY DATA:  None.   ASSESSMENT AND PLAN:  1. Complicated patient with recent onset boil left forehead refractory to 24     hours of oral antibiotics.  Area of erythema is much worse and I am     concerned that there is some fluctuance.  At the bedside, I did stick a     needle into the area after it was prepped and draped in a sterile     fashion.  Approximately 1 mL of purulent material is obtained and that     was sent for a culture in a culture tube.  She clearly needs further     evaluation.  I have talked with Carolan Shiver, M.D. of ENT.  He is going     to come see the patient.  She needs broad-spectrum antibiotics and I will     start those.  I am also concerned for the rapid spread of this cellulitis     and will obtain a CT of the head along with  paranasal sinuses.  2. Multiple other medical problems including long-term steroid use.  I will     increase prednisone to 20 mg p.o. daily.                                               Bruce Rexene Edison Swords, M.D. [facility]    BHS/MEDQ  D:  02/03/2003  T:  02/03/2003  Job:  [id number]   cc:   Gordy Savers, M.D. [facility]

## 2011-03-19 NOTE — Op Note (Signed)
NAME:  Grace Robinson, Grace Robinson                          ACCOUNT NO.:  [id number]   MEDICAL RECORD NO.:  [id number]                   PATIENT TYPE:  OIB   LOCATION:  2030                                 FACILITY:  [facility]   PHYSICIAN:  Colleen Can. Deborah Chalk, M.D.            DATE OF BIRTH:  [birth year]   DATE OF PROCEDURE:  08/07/2002  DATE OF DISCHARGE:  08/08/2002                                 OPERATIVE REPORT   INDICATIONS FOR PROCEDURE:  Left ventricular dysfunction, congestive heart  failure, wide QRS configuration.   PROCEDURE:  Implantation of a biventricular pacemaker with atrial,  ventricular and left ventricular coronary sinus lead under fluoroscopy.   DESCRIPTION OF PROCEDURE:  The left subclavicular area was prepped and  draped.  A subcutaneous pocket was created.  Three  punctures were made into the subclavian vein medial to the first rib.  Using  7 French Cook introducer, the right ventricular lead was introduced.  The  right ventricular lead was a Guidant, model 4470 active fixation lead,  serial [id number].  The coronary sinus catheter was then introduced and  positioned. We then returned and placed the right atrial lead. The right  atrial lead was a Guidant atrial active fixation lead, model 4469, serial  [id number].  We then returned to place the left ventricular lead.  Initially we had a lateral vein that appeared to be excellent in quality and  charter.  This location was felt to be superior.  We were able to pass guide  wires across this but the lead was unable to pass a particular point  approximately 5 cm from the termination of the vein into the coronary sinus  proper.  Using the guide wire that was in position, we passed a 2.5 x 20 mm  Maverick balloon with inflation up to 20 atmospheres. However, this did not  satisfactorily open the severe focal stenotic location.  As we attempted to  pass guide wires thereafter, it was noted that the vein had dissected with  thrombosis and was probably occluded.  We returned to a higher lateral vein.  Fortunately, we were able to pass the guide wire and the lead through this  vein. The positioning of the left ventricular lead required an excessive  amount of time considering multiple attempts at passing the lead into this  first lateral vein.  However, the second lateral vein was satisfactory and  thresholds were excellent and position was felt to be satisfactory as well.  The threshold were recorded an atrial lead at 0.9 volts to capture at 1.5 mA  current, 0.5 msec pulse width. Impedence were 670 ohms and P waves were 2.8  mV.  The right ventricle threshold 1.1 volts to capture, 1.4 mA current at  0.5 msec pulse width. Impedence was 909 ohms and R waves were 25.3 mV. The  left ventricular lead is 0.5 volts to capture at 0.9 mA current with 0.5  msec pulse width. Impedence was 548 ohms and R waves were 30 mV.  All the  leads were sutures in place and the wound was flushed with kanamycin  solution. The leads were connected to a Medtronic InSync 3 pulse generator,  model I078015, serial [id number].  The lead was sutured in place, the wound  was closed with 2.0 and subsequently 5-0 Dexon and Steri-Strips were  applied.  The patient tolerated the procedure well.  Total length of the  procedure four hours.                                                  Colleen Can. Deborah Chalk, M.D.    SNT/MEDQ  D:  08/07/2002  T:  08/09/2002  Job:  [id number]

## 2011-03-19 NOTE — Op Note (Signed)
NAMECATHIE, Grace Robinson NO.:  [id number]   MEDICAL RECORD NO.:  [id number]          PATIENT TYPE:  INP   LOCATION:  2899                         FACILITY:  [facility]   PHYSICIAN:  Duke Salvia, M.D.  DATE OF BIRTH:  01-11-1926   DATE OF PROCEDURE:  12/27/2005  DATE OF DISCHARGE:                                 OPERATIVE REPORT   PREOPERATIVE DIAGNOSIS:  Failure to rate sense portion of the previously  implanted defibrillator lead.   POSTOPERATIVE DIAGNOSIS:  Failure to rate sense portion of the previously  implanted defibrillator lead.   PROCEDURE PERFORMED:  Incorporated previously implanted RV lead that had  been capped at the time of ICD lead implantation into the defibrillator  system with intraoperative defibrillation threshold testing.   DESCRIPTION OF PROCEDURE:  Following the obtaining of informed consent, the  patient was brought to the electrophysiology laboratory and placed on the  fluoroscopic table in supine position.  In anticipation of the need of new  lead insertion, contrast venography was venography was undertaken of the  left subclavian vein demonstrating likely total occlusion of the left  subclavian venous system based on the significant collateralization noted  across the proximal and mid subclavian and innominate veins.  Thereafter  lidocaine was infiltrated along the line of the previous incision.  An  incision was made and carried down to the layer of the defibrillator pocket  using sharp dissection.  The pocket was opened, hemostasis was obtained. The  previously implanted defibrillator was explanted.  The RV portion of the  previously implanted Medtronic 69 49 cm defibrillator lead was removed from  the device.  The previously implanted guidance, model 4470, serial number  [id number] previously implanted RV lead that was part of the patient's pacemaker  system that had been capped about a year ago was uncapped and interrogated.  The R wave  was 30 mV with a pacing impedance of 457 ohms and threshold of  0.6 V at 0.5 msec and current at threshold was 1.5 mA.  With these  acceptable parameters recorded, this lead then attached to Medtronic InSync  Sentry 7299 ICD serial number [id number].  This was a previously implanted  CRT ICD from 2/06.  Through the device, the bipolar P wave was 1.6 mV with  pacing impedance of 536 ohms. The R wave was 20.3 mV with pacing impedance  of 400 ohms.  The LV impedance was 400 ohms.  RV threshold was 1 V at 0.4.  The high voltage impedance was 51 ohms.  The proximal coil impedance was 66  ohms.   At this point a 1 joule test shock was undertaken.  It demonstrates the  integrity of the shocking system.  The impedance was 51 ohms.   Hereafter ventricular fibrillation was induced via the T-wave shock.  After  a total duration of about 7 seconds, a 15 joule shock was delivered through  a measured resistance of 43 ohms terminating ventricular fibrillation and  restoring sinus rhythm.  At this point the device was implanted.  The pocket  was copiously irrigated with antibiotic  containing saline solution.  Hemostasis was assured.  There was some oozing along the incision and so  Surgicel was placed at the superior aspect of the wound and the wound was  then closed in three layers in normal fashion. The  wound was washed, dried and a benzoin and Steri-Strip dressing was applied  thereafter.  Sponge, needle and instrument counts were correct at the end of  the procedure according to staff.  The patient tolerated the procedure  without apparent complication.           ______________________________  Duke Salvia, M.D.     SCK/MEDQ  D:  12/27/2005  T:  12/27/2005  Job:  [id number]   cc:   Colleen Can. Deborah Chalk, M.D.  Fax: [phone]   [place] pacemaker cl   [facility]   Electrophys lab

## 2011-03-19 NOTE — Discharge Summary (Signed)
NAME:  Grace Robinson, Grace Robinson                          ACCOUNT NO.:  [id number]   MEDICAL RECORD NO.:  [id number]                   PATIENT TYPE:  INP   LOCATION:  0446                                 FACILITY:  [facility]   PHYSICIAN:  Rene Paci, M.D. [facility]          DATE OF BIRTH:  Jul 27, 1926   DATE OF ADMISSION:  02/03/2003  DATE OF DISCHARGE:  02/05/2003                                 DISCHARGE SUMMARY   DISCHARGE DIAGNOSES:  1. Facial cellulitis secondary to forehead abscess, status post irrigation     and debridement, improved.  2. History of viral cardiomyopathy/pericarditis, currently on steroids.  3. Type 2 diabetes.   DISCHARGE MEDICATIONS:  1. Cipro 750 mg p.o. b.i.d. x3 weeks.  2. Prednisone 10 mg p.o. daily.  3. Toprol XL 25 mg p.o. daily.  4. Lanoxin 0.25 mg p.o. daily.  5. Lopid 600 mg p.o. b.i.d.  6. Glucovance 5/500 p.o. b.i.d.  7. Aspirin p.o. daily.  8. Diovan 150 mg p.o. daily.   FOLLOWUP:  1. Scheduled with Dr. Dorma Russell of [facility] for later this week on 02/07/03     at 3 p.m., to re-evaluate the I&D site and continued progression on oral     antibiotics.  2. She is also to contact her primary care physician, Dr. Amador Cunas, for     routine followup as needed.   HISTORY OF PRESENT ILLNESS:  The patient is a 77 year old white female who  presented to the emergency department the day of admission secondary to  swelling and redness starting across her face.  She had been well until  three days prior when she noticed a painful boil over her left  forehead/scalp near the hairline.  It progressed over the following day, and  she was seen at urgent care on the Sunday one day prior to admission and  given an oral prednisone for Keflex t.i.d.  She took four tablets, and she  did not notice improvement and she sought evaluation in the emergency room.  Upon evaluation, it was felt the patient needed IV antibiotics as she had  failed outpatient oral therapy.  ENT  consult was also obtained.   HOSPITAL COURSE:  #1 -  FACIAL CELLULITIS SECONDARY TO ABSCESS:  The patient  was begun on IV Zosyn.  Her ENT consult with Dr. Dorma Russell was obtained.  He  performed a local I&D and placed a drain in this area for overnight  observation.  The patient's swelling and erythema quickly resolved on this  treatment.  After 36 hours, the drain was discontinued.  The patient was  instructed on how to maintain dressing changes.  Her IV antibiotics were  changed to oral Cipro.  The patient is now medically stable for discharge.  She is to continue the oral antibiotics for three weeks, and has followup as  scheduled above with ENT.  A head CT scan was done and showed no  intracranial  extension or sinus source for the original abscess.  #2 -  HISTORY OF RECENT PERICARDITIS/CARDIOMYOPATHY:  The patient was on  prednisone for this condition.  She was given a stress dose of twice her  usual prednisone.  After two days, she was returned to her usual prednisone  dosing.  No chest pain or other cardiac issues were addressed during this  hospitalization.  #3 -  TYPE 2 DIABETES:  While the patient was on increased dose of steroids,  she had slight exacerbation of her  CBG's.  She was maintained on sliding scale insulin in addition to her usual  medications.  Now at discharge, the patient is being returned to her usual  prednisone dosage and her usual return of diabetic control will return upon  discharge.                                               Rene Paci, M.D. [facility]    VL/MEDQ  D:  02/05/2003  T:  02/05/2003  Job:  [id number]   cc:   Carolan Shiver, M.D.  [address]  [place]  [place] [address]  Fax: [phone]   Gordy Savers, M.D. [facility]

## 2011-03-19 NOTE — H&P (Signed)
NAMEVALMAI, VANDENBERGHE                ACCOUNT NO.:  [id number]   MEDICAL RECORD NO.:  [id number]          PATIENT TYPE:  AMB   LOCATION:                               FACILITY:  [facility]   PHYSICIAN:  Colleen Can. Deborah Chalk, M.D.DATE OF BIRTH:  July 06, 1926   DATE OF ADMISSION:  11/11/2004  DATE OF DISCHARGE:                                HISTORY & PHYSICAL   CHIEF COMPLAINT:  Shortness of breath with exertion.   HISTORY OF PRESENT ILLNESS:  The patient is a very pleasant 77 year old  white female, who has multiple medical problems.  She has had a known non-  ischemic cardiomyopathy that dates back to 47.  She does have a  biventricular pacemaker in place, currently in the VVI mode that has been  implanted since October 2003.  She has had a recent Cardiolite study as part  of a follow-up evaluation for the possible referral for an upgrade to a  biventricular defibrillator unit.  She underwent an adenosine Cardiolite  study in November 2005, which showed an ejection fraction of 19%.  There was  global hypokinesia with reduced LV ejection fraction.  There was no ischemia  noted.  She has subsequently been referred for consideration for upgrade to  a biventricular/defibrillator pacemaker.  There has been concern for the  possibility of coronary ischemia.  Her last cardiac catheterization dates  back to 11 years ago, to 87.  Clinically she has had no complaints of  chest pain, but she is now referred for an elective cardiac catheterization.   PAST MEDICAL HISTORY:  1.  Cardiomyopathy, non-ischemic, diagnosed in 1994.  At that time she had      normal coronaries documented.  She has been intolerant to COREG in the      past.  2.  Left bundle branch block.  3.  Hypertensive heart disease.  4.  Hyperlipidemia.  5.  Diabetes, diagnosed in 2002.  6.  Functioning biventricular pacemaker implanted in October 2003.  7.  Past history of pericardial effusion that was associated with atrial  fibrillation in February 2004.  8.  Past history of cellulitis, requiring a hospitalization in May 2004.   ALLERGIES:  No known drug allergies.  Intolerant to COREG in the past.   CURRENT MEDICATIONS:  1.  Diovan 160 mg daily.  2.  Aspirin daily.  3.  Metformin 1000 mg b.i.d.  4.  Lipitor 10 mg daily.  5.  Toprol XL 25 mg daily.  6.  Glipizide 5 mg, 1/2 tab daily.  7.  Lopid 600 mg b.i.d.   PAST SURGICAL HISTORY:  1.  Childbirth.  2.  Total abdominal hysterectomy.  3.  Previous tonsillectomy.   FAMILY HISTORY:  Father died at age 66 with emphysema.  Mother died at age  55 with cancer.   SOCIAL HISTORY:  She is a retired Engineer, site.  She has a remote history  of tobacco use.  There is no alcohol use.   REVIEW OF SYSTEMS:  The review of systems is basically as noted above.  She  has had no complaints of chest  pain.  She does have shortness of breath if  she over-exerts or walks up a hill or incline.  Her blood pressure readings  at home have only been 140-150 systolic, but she is not sure if her cuff is  actually accurate.  She states her blood sugars have been basically  satisfactory.  She has had no lightheaded, no dizziness.  No chest pain  syndrome.  Otherwise the review of systems is as noted above and otherwise  unremarkable.   PHYSICAL EXAMINATION:  GENERAL:  She is a very pleasant female, who appears  younger than her stated age.  VITAL SIGNS:  Blood pressure 150/80 sitting, 150/90 standing, weight 136  pounds, heart rate 104, respirations 18.  She is afebrile.  SKIN:  Is warm and dry.  Color is unremarkable.  LUNGS:  Basically clear.  HEART:  A regular rhythm.  ABDOMEN:  Soft, positive bowel sounds, nontender.  EXTREMITIES:  Without edema.  NEUROLOGIC:  Intact.  No gross focal deficits.   LABORATORY DATA:  An electrocardiogram shows a paced rhythm.   PT and PTT are unremarkable.  CBC is normal.  Chemistries are normal.   OVERALL IMPRESSION:  1.  Shortness  of breath on exertion.  2.  Known severe left ventricular dysfunction.  3.  Past history of remote atrial fibrillation.  4.  Remote history of pericardial effusion.  5.  Diabetes mellitus.  6.  Hyperlipidemia.  7.  Non-ischemic cardiomyopathy with the last cardiac catheterization dating      back to 1994.  She is COREG intolerant.  8.  Functioning biventricular pacemaker.   PLAN:  We will proceed on with a repeat cardiac catheterization, to rule out  the possibility of ischemia.  Consideration will then be made for her to  proceed on with an upgrade to a biventricular/defibrillator unit.  The full  procedure has been explained to the patient, as well as to her son,  including the risks and benefits, and she is willing to proceed on  Wednesday, November 11, 2004.       ________________________________________  Sharlee Blew, N.P.  ___________________________________________  Colleen Can. Deborah Chalk, M.D.    LC/MEDQ  D:  11/09/2004  T:  11/09/2004  Job:  [id number]   cc:   Gordy Savers, M.D. [facility]   Duke Salvia, M.D.   Gwen Pounds, MD  Fax: [phone]

## 2011-03-19 NOTE — Assessment & Plan Note (Signed)
[facility]                           ELECTROPHYSIOLOGY OFFICE NOTE   DREAMA, KUNA                       MRN:          [id number]  DATE:05/10/2006                            DOB:          13-Oct-1926    REFERRING PHYSICIAN:  Duke Salvia   Grace Robinson is seen following device generator revision with incorporation  of her previously implanted pacemaker lead after she developed exit block of  her defibrillator lead in the context of her CRT.  She is doing quite well.  She was having a variety of spells prior to this where she felt like her  heart was in her throat and she could not breathe.  This has abated since  the device was revised.  I wonder whether it was related to left ventricular  pacing.  On examination her blood pressure is elevated at 166/83 not  withstanding her legion medicines.  Pulse is 74.  Lungs were clear.  Heart  sounds were regular.  The device pocket was well healed with a little bit of  atrophy on the superior aspect of the pocket.   Interrogation of her Medtronic Sentry device demonstrates a P-wave of 1.9  with an impedance of 528, threshold of 1 volt at 0.2.  The R-wave was 20.2  with impedance of 408, a threshold of 1 volt at 0.3.  The LV impedance was  460 with a threshold of 1 volt at 0.2.  Device was reprogrammed to maximize  longevity.   IMPRESSION:  1.  Non-ischemic cardiomyopathy.  2.  Class III congestive failure, now in class II.  3.  Status post CRT for the above.   Grace Robinson is stable.  Will plan to see her again in one year's time.  Her  other follow-up will be with Dr. Reyes Ivan at Dr. Ronnald Nian office.                                   Duke Salvia, MD, [facility]   SCK/MedQ  DD:  05/10/2006  DT:  05/10/2006  Job #:  [id number]

## 2011-03-19 NOTE — Op Note (Signed)
Grace Robinson, MIGNOGNA NO.:  [id number]   MEDICAL RECORD NO.:  [id number]          PATIENT TYPE:  INP   LOCATION:  4743                         FACILITY:  [facility]   PHYSICIAN:  Duke Salvia, M.D.  DATE OF BIRTH:  10-Jun-1926   DATE OF PROCEDURE:  12/28/2004  DATE OF DISCHARGE:                                 OPERATIVE REPORT   PREOPERATIVE DIAGNOSIS:  Nonischemic cardiomyopathy, previously implanted  CRT pacemaker, with persistent depression of left ventricular function, now  for defibrillator upgrade.   POSTOPERATIVE DIAGNOSIS:  Nonischemic cardiomyopathy, previously implanted  CRT pacemaker, with persistent depression of left ventricular function, now  for defibrillator upgrade.   PROCEDURE:  Dual-chamber pacemaker explantation, pocket revision, ICD  implantation, with intraoperative defibrillation threshold testing.   Following obtaining informed consent, the patient was brought to the  electrophysiology laboratory and placed on the fluoroscopic table in the  supine position.  After routine prep and drape of the left upper chest,  lidocaine was infiltrated over the line of the previous incision and carried  down to the pacemaker pocket using sharp dissection.  The pocket was only  half opened to allow access without exposing the device and the leads to the  external environment.   At this point, attention was turned to getting access to the extrathoracic  left subclavian vein which was accomplished without difficulty.  A glide  wire was placed.  However, there was not enough body over this wire to allow  for the passage of the [employer] sheath.  The 7 French sheath dilator was  then placed, and this passed easily.  We then placed a 7 French sheath, but  the Medtronic 6949, 65 cm dual coil active fixation defibrillator lead, with  serial [id number] lead would not pass past the innominate/IVC junction as  was partially anticipated.  Because of this, a long 7  [place] introducer  sheath was placed which passed easily past this stricture into the SVC and  through this was then passed the same Medtronic lead.  It was manipulated  into the right ventricular apex, where the bipolar R wave was 15.3  millivolts, with a pacing impedance of 849 ohms and a threshold of 0.5 volts  at 0.5 msec, current threshold of 0.8 MA, and there was no diaphragmatic  pacing at 10 volts.   The pocket was then opened to expose the previously implanted pacemaker.  The pacemaker was explanted, and the leads were freed up from the fibrous  tissue which had encased them below the pulse generator.  The pocket was  expanded to house a larger defibrillator.  The leads were then attached to a  Medtronic InSync Sentry [id number] ICD, serial number [id number].  Through the  device, the bipolar P wave was 1.7 millivolts, with a pacing impedance of  432 ohms, a threshold of 1 volt at 0.3 msec.  The R wave was 19 millivolts,  with a pacing impedance of 984 ohms, with a threshold of 1.5 volts at 0.2  msec, and 2 volts at 0.1 msec, and the LV impedance was 416.  It was an LV  unipolar lead.  The previously implanted atrial lead was a Guidant 4469  passive lead, serial no. [id number].  The previously implanted ventricular lead  was kept.   The leads and the pulse generator were then placed in the pocket, and DFT  testing was undertaken.  Ventricular fibrillation was induced through a T  wave shock.  After a total duration of 6 seconds, a 15.2-joule shock was  delivered through a measured impedance of 50 ohms, terminating ventricular  fibrillation and restoring sinus rhythm.  After a wait of five to six  minutes, ventricular fibrillation was reinduced via a T wave shock.  After a  total duration of 6 seconds, a 15.3-joule shock was delivered through a  measured resistance of 49 ohms, terminating ventricular fibrillation,  restoring sinus rhythm.  At this point, the device was implanted.  The   pocket was copiously irrigated with antibiotic-containing saline solution.  Hemostasis was ensured.  Surgicel was placed in the loose fatty tissue at  the new floor of the pocket as well as along the new ceiling of the pocket.  The leads and the pulse generator were placed in the pocket and were secured  to the prepectoral fascia.  The wound was then closed in three layers in the  normal fashion.  The wound was washed, dried, and Benzoin and Steri-Strip  dressing was applied.  Needle counts, sponge counts, and instrument counts  were correct at the end of the procedure, according to the staff.  The  patient tolerated the procedure without apparent complication.      SCK/MEDQ  D:  12/28/2004  T:  12/28/2004  Job:  [id number]   cc:   Colleen Can. Deborah Chalk, M.D.  Fax: [phone]   Electrophysiology Laboratory   [facility]

## 2011-03-19 NOTE — Discharge Summary (Signed)
   NAME:  Grace Robinson, Grace Robinson                          ACCOUNT NO.:  [id number]   MEDICAL RECORD NO.:  [id number]                   PATIENT TYPE:  OIB   LOCATION:  2030                                 FACILITY:  [facility]   PHYSICIAN:  Colleen Can. Deborah Chalk, M.D.            DATE OF BIRTH:  05-10-1926   DATE OF ADMISSION:  08/07/2002  DATE OF DISCHARGE:  08/08/2002                                 DISCHARGE SUMMARY   PRIMARY DISCHARGE DIAGNOSIS:  Class II congestive heart failure with  subsequent implantation of a biventricular DDDR [facility] 11914 pacemaker,  Serial Number [id number]).   SECONDARY DISCHARGE DIAGNOSES:  1. Cardiomyopathy.  2. Non-insulin-dependent diabetes mellitus.  3. Left bundle branch block.  4. Hyperlipidemia.  5. Hypertension.   HISTORY OF PRESENT ILLNESS:  The patient is a very pleasant 77 year old  female who has Class III congestive heart failure in the setting of severe  LV dysfunction and left bundle branch block.  She was referred on for  elective implantation of a biventricular pacemaker unit.  She has had no  frank syncope.  She has had weakness and fatigue with exertion.   Please see History and Physical for further patient presentation and  profile.   LABORATORY DATA:  On admission, CBC was normal.  Chemistries were  satisfactory.  PT and PTT were unremarkable.   HOSPITAL COURSE:  The patient was admitted from the short-stay area in order  to undergo implantation of a biventricular DDDR [facility] 86578 pacemaker,  Serial Number [id number].  The overall procedure was tolerated well.  Post  procedure, she was transferred to 2000, and today, on 08/08/2002, she is  doing well without complaints.  She is up and ambulatory.  Vital signs are  stable.  Physical exam was unremarkable.  She is currently pacing on  telemetry, and her pacemaker site itself is stable.  She is felt to be a  stable candidate for discharge today.   CONDITION ON DISCHARGE:  Improved.   DISCHARGE MEDICATIONS:  The patient will resume all of her previous  medicines.  She may use Tylenol for discomfort.    DISCHARGE INSTRUCTIONS:  Extensive written instructions are given regarding  pacemaker care.  We will see her back in the office in approximately 10 to  12 days for wound check, and she is to call if any problems would arise in  the interim.     Juanell Fairly C. Earl Gala, N.P.                 Colleen Can. Deborah Chalk, M.D.    LCO/MEDQ  D:  08/08/2002  T:  08/10/2002  Job:  [id number]   cc:   Gordy Savers, M.D. [facility]

## 2011-03-19 NOTE — Discharge Summary (Signed)
NAMEELIKA, Grace Robinson NO.:  [id number]   MEDICAL RECORD NO.:  [id number]          PATIENT TYPE:  INP   LOCATION:  4743                         FACILITY:  [facility]   PHYSICIAN:  Doylene Canning. Ladona Ridgel, M.D.  DATE OF BIRTH:  Mar 03, 1926   DATE OF ADMISSION:  12/28/2004  DATE OF DISCHARGE:  12/29/2004                           DISCHARGE SUMMARY - REFERRING   SUMMARY OF HISTORY:  Grace Robinson is a 77 year old female with a history of  dilated cardiomyopathy.  Catheterization on November 11, 2004, showed known  obstructive coronary artery disease with an EF of 20%, global hypokinesis  with a history of CHF, class II, left bundle branch block.  She underwent  Medtronic pacemaker on August 07, 2002.  She is being admitted for ICD.   PAST MEDICAL HISTORY:  1.  Meniere's disease.  2.  Hypertension.  3.  Dyslipidemia.  4.  Diabetes.   LABORATORY DATA:  Chest x-ray on December 28, 2004, showed mild airway  thickening suggesting bronchitis or reactive airway disease.  Preadmission  H&H was 13.7 and 39.9, normal indices, platelets 264, wbc 5.0.  PTT 27.3, PT  12.8.  Sodium 141, potassium 3.7, BUN 15, creatinine 0.9, glucose 149.   HOSPITAL COURSE:  Grace Robinson had a successful upgrade with a Medtronic  InSync Sentry ICD by Dr. Graciela Husbands on December 28, 2004.  Post procedure, Grace Robinson checked on the patient and noted that patient experienced a reaction  to IV vancomycin in the EP lab.  He described this as a Redman's syndrome  aborted with IV Benadryl and the patient slept all afternoon.  Her IV  vancomycin was changed to IV Ancef and was given a prescription for Keflex  for five days.  On December 29, 2004, Dr. Ladona Ridgel reviewed and after  interrogation, felt that the patient could be discharged home.   DISCHARGE DIAGNOSES:  1.  Status post implantation of Medtronic implantable cardioverter      defibrillator.  2.  History of nonischemic cardiomyopathy with ejection fraction 20%,  global      hypokinesis, left ventricular dyssynergy with left bundle branch block.      History as previously.   DISPOSITION:  She is discharged home.   DISCHARGE MEDICATIONS:  1.  Lipitor 10 mg q.h.s.  2.  Toprol XL 25 mg daily.  3.  Diovan 160 daily.  4.  Gemfibrozil 40 mg b.i.d.  5.  Coated aspirin 325 daily.  6.  She was asked to hold Metformin 1 g b.i.d. for the next two days.  7.  She was given a new prescription for Keflex 500 mg q.6h. x5 days.  8.  Tylenol for discomfort.   She was given an activity sheet in regards to progressive activities and  advised no showers for one week, to resume showers on Monday, January 04, 2005.  Maintain low salt, fat, cholesterol, diabetic diet.  If she has any problems  with her incisions she was asked to call.  She will be seen in the ICD  clinic on Monday, January 11, 2005, at 9 a.m. and Dr. Graciela Husbands in  approximately  three months and the office will call her with that appointment.      EW/MEDQ  D:  12/29/2004  T:  12/29/2004  Job:  [id number]   cc:   Colleen Can. Deborah Chalk, M.D.  Fax: [phone]   Gwen Pounds, MD  Fax: [phone]   Gordy Savers, M.D. [facility]

## 2011-03-19 NOTE — H&P (Signed)
NAMESHAMRA, Grace Robinson NO.:  [id number]   MEDICAL RECORD NO.:  [id number]          PATIENT TYPE:  INP   LOCATION:  2899                         FACILITY:  [facility]   PHYSICIAN:  Grace Robinson, M.D.  DATE OF BIRTH:  1926/03/15   DATE OF ADMISSION:  12/28/2004  DATE OF DISCHARGE:                                HISTORY & PHYSICAL   CARDIOLOGIST:  Dr. Roger Robinson.   ELECTROPHYSIOLOGIST:  Dr. Sherryl Robinson   PRIMARY CARE PHYSICIAN:  Dr. Timothy Robinson   ALLERGIES:  The patient has no known drug allergies.   PRESENTING CIRCUMSTANCE:  I am here for an ICD.   HISTORY OF PRESENT ILLNESS:  Grace Robinson is a 77 year old female with a known  history of non-ischemic cardiomyopathy.  She underwent left heart  catheterization November 11, 2004 at the urging of Dr. Deborah Robinson primarily  because her last catheterization was 10 years ago and consideration was  being given to implantation of cardioverter defibrillator.  Studies showed  essentially non-obstructive coronary artery disease with global hypokinesis,  ejection fraction 20%.  The patient's functional congestive heart failure  status is class II.  She sees no significant abridgment of her activities of  daily living.   PAST MEDICAL HISTORY:  1.  Meniere's disease.  2.  Dyslipidemia.  3.  Diabetes.   PAST SURGICAL HISTORY:  1.  Tonsillectomy.  2.  Hysterectomy.  3.  Status post cholecystectomy.  4.  Mastoid surgery in the setting of Meniere's disease.   Patient does have cardiac risk factors which include diabetes.  The patient  is currently just on Metformin.  She has been able to, by regulating diet,  discontinue Glyburide.   Patient gives no prior history of myocardial infarction, seizure, GI bleed,  pulmonary embolism, deep venous thrombosis, or stroke.  The patient did have  biventricular pacemaker placed about three years ago, a Medtronic.   MEDICATIONS:  1.  Lipitor 10 mg daily at bedtime.  2.  Toprol XL 25 mg  daily.  3.  Diovan 160 mg daily.  4.  Metformin 1 g b.i.d. currently on hold.  5.  Gemfibrozil 600 mg b.i.d.  6.  Aspirin 325 mg daily.   SOCIAL HISTORY:  The patient lives in [place] alone with my dog.  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  She does not  smoke.  Does not partake of alcoholic beverages and has no recreational drug  history.   FAMILY HISTORY:  Not solicited this admission.   REVIEW OF SYSTEMS:  The patient is not having fevers, chills, night sweats.  No abrupt or chronic weight change, either loss or gain.  HEENT:  No nasal  discharge.  No epistaxis.  Has had some mild sinusitis lately per patient.  No hoarseness.  She does have vertiginous symptoms when she is flat at times  but these are transient.  She is somewhat hard of hearing.  INTEGUMENT:  No  ulcerations or lesions or rashes.  CARDIOPULMONARY:  Patient does not  exhibit chest pain, shortness of breath, dyspnea on  exertion.  No orthopnea  or paroxysmal nocturnal dyspnea.  She says that after standing all day she  will have mild edema.  Before pacemaker was placed she would note that her  heart was skipping.  She has no history of presyncope or syncope.  No  claudication.  No wheezing.  UROGENITAL:  This system was not discussed.  NEURO/PSYCHIATRIC:  No ongoing weakness, numbness, depression, or anxiety.  GI:  Patient has no complaint of heartburn, gastroesophageal reflux.  No  history of bright red blood per rectum or melena.  No current experience of  nausea, vomiting, or diarrhea.  ENDOCRINE:  The patient does have diabetes  as mentioned above, somewhat diet controlled.  Her serum glucose with  laboratory studies done prior to this was 149.  She has no history of  thyroid disease.  All other systems are negative.   PHYSICAL EXAMINATION:  VITAL SIGNS:  Patient is afebrile.  Vital signs are  stable.  GENERAL:  Alert and oriented.  Extremely articulate and cheery patient.  HEENT:   Eyes:  Pupils are equal, round, reactive.  Extraocular movements are  intact.  Sclerae are clear.  Nares without discharge.  NECK:  Supple.  There are no carotid bruits auscultated.  No jugular venous  distention.  No cervical lymphadenopathy.  HEART:  Regular rate and rhythm.  S1, S2 auscultated.  No murmur.  LUNGS:  Clear to auscultation bilaterally.  ABDOMEN:  Soft, nondistended.  Bowel sounds are present.  Abdominal aorta is  non-pulsatile.  EXTREMITIES:  No evidence of gross edema, clubbing, cyanosis.  MUSCULOSKELETAL:  The patient exhibits no joint deformities or effusions.  NEUROLOGIC:  The patient exhibits no neurologic deficits.   LABORATORIES:  PT 12.8, INR 12.1, PTT 27.3.  Complete blood count:  White  cells are 5, hemoglobin 13.7, hematocrit 39.9, platelets 264.  Serum  electrolytes:  Sodium 141, potassium 3.7, chloride 104, carbonate 28, BUN  15, creatinine 0.9, glucose 149.   IMPRESSION:  1.  Admitted February 27 for cardioverter defibrillator implantation.  2.  Non-ischemic cardiomyopathy, ejection fraction 20% by catheterization      November 11, 2004.  3.  Class II congestive heart failure.  4.  Meniere's disease.  5.  Dyslipidemia.  6.  Diabetes mellitus.  7.  Past surgical history includes tonsillectomy, hysterectomy, status post      cholecystectomy, status post mastoid surgery.   PLAN:  Implantation of cardioverter defibrillator by Dr. Graciela Husbands.      GM/MEDQ  D:  12/28/2004  T:  12/28/2004  Job:  [id number]

## 2011-03-19 NOTE — Discharge Summary (Signed)
NAME:  Grace Robinson, Grace Robinson                          ACCOUNT NO.:  [id number]   MEDICAL RECORD NO.:  [id number]                   PATIENT TYPE:  INP   LOCATION:  3741                                 FACILITY:  [facility]   PHYSICIAN:  Colleen Can. Deborah Chalk, M.D.            DATE OF BIRTH:  08/26/1926   DATE OF ADMISSION:  12/03/2002  DATE OF DISCHARGE:  12/12/2002                                 DISCHARGE SUMMARY   DISCHARGE DIAGNOSES:  1. Pericardial effusion with subsequent improvement.  2. Acute sinusitis.  3. Paroxysmal atrial fibrillation.  4. Cardiomyopathy with severe left ventricular dysfunction.  5. Non-insulin dependent diabetes mellitus, currently with elevated     hemoglobin A1C.  6. History of left bundle branch block.  7. Hyperlipidemia.  8. History of hypertension.  9. Functioning biventricular Medtronic pacemaker.   HISTORY OF PRESENT ILLNESS:  The patient is a very pleasant, 77 year old  female who has multiple medical problems.  She presented to the office with  significant weakness and fatigue on the day of admission.  She was seen with  complaints of significant phlegm production as well as presyncope.  She was  not eating.  She continued to have bloody rhinorrhea.  She was subsequently  admitted to the hospital for further evaluation.  Please see the dictated  History and Physical for further patient presentation and profile.   LABORATORY DATA AND X-RAY FINDINGS:  White count 9000, hematocrit 33.  INR  was elevated at 1.6.  Digoxin level was 0.7.  Blood sugar on admission was  noted to be 490.   ASSESSMENT:  The patient was admitted to telemetry.  She was initially  hydrated with IV fluids.  Complete labs were checked.  We proceeded on with  CT scan of the chest as well as initiation of IV antibiotics.  Internal  medicine consultation was called for as well.   PROCEDURES:  1. Limited computed tomography of the sinuses showed small air fluid level     in the right  maxillary sinus consistent with acute sinusitis.  2. Computed tomography scan of the chest showed a small pericardial effusion     with cardiomegaly and left ventricular enlargement with no evidence of     acute pulmonary process.  3. Two-dimensional echocardiogram performed on December 06, 2002, continues     to demonstrate significant pericardial effusion.  A followup study was     performed on February 10, which shows improvement with only a trivial     amount of effusion remaining.   HOSPITAL COURSE:  The patient was admitted to telemetry.  She was placed on  IV hydration as well as IV antibiotics.  She did spike a fever on the night  of admission to 101.8.  She was also noted to have runs of atrial  fibrillation which was new.  She was subsequently started on subcutaneous  Lovenox with rate control management.  Extensive  evaluation occurred for  evaluation of the pericardial effusion.  A PPD was negative.  Sedimentation  rate was noted to be grossly elevated.  ANA was negative.  She remained  somewhat weak and washed out throughout the early portion of her  hospitalization.  She was noted to have a rub on cardiac exam consistent  with pericardial effusion.  This was felt to be probably viral in etiology.   By December 07, 2002, she was not as short of breath as she previously had  been.  She was back in sinus rhythm.  She was monitored over the course of  the weekend with continued therapy.  She was subsequently switched over to  oral antibiotics.  By December 11, 2002, the patient was doing considerably  better.  Followup 2D echocardiogram showed considerable improvement.  She  was noted to have an elevated hemoglobin A1C at 8.3 which will need to be  evaluated and treated appropriately on an outpatient basis.  She has had  some short runs of atrial fibrillation and is now tolerating low-dose beta-  blocker therapy.   Today, on December 12, 2002, she is doing well.  Her cardiac  standpoint has  greatly improved.  She currently has no rub noted on auscultation.  Her  rhythm has remained stable with beta-blocker and digoxin therapy.  She is  felt to be a stable candidate for discharge today for further evaluation to  occur on outpatient basis.   CONDITION ON DISCHARGE:  Improved.   DISCHARGE MEDICATIONS:  1. Toprol XL 25 mg a day.  2. Diovan 160 mg a day.  3. Lanoxin 0.25 mg daily.  4. Aspirin daily.  5. Lopid 600 mg b.i.d.  6. Glucovance 5/500 two times a day.  7. Lipitor 10 mg q.d.  8. Vioxx 25 mg x2 weeks.  9. Augmentin 875 mg b.i.d. x1 one week.   ACTIVITY:  As tolerated.   SPECIAL INSTRUCTIONS:  We will have her check her blood sugars one to two  times each day and record those.   FOLLOW UP:  We will have her follow up with Dr. Amador Cunas in one to two  weeks.  We will do a followup echocardiogram in our office on Tuesday, at 1  p.m.  Otherwise, we will plan on seeing her back in the office in  approximately 7-10 days and she is to call to schedule this appointment.     Juanell Fairly C. Earl Gala, N.P.                 Colleen Can. Deborah Chalk, M.D.    LCO/MEDQ  D:  12/12/2002  T:  12/12/2002  Job:  [id number]   cc:   Gordy Savers, M.D. [facility]  [address] [place]  [place] [address]  Fax: [phone]

## 2011-03-19 NOTE — H&P (Signed)
NAME:  Grace Robinson, Grace Robinson                          ACCOUNT NO.:  [id number]   MEDICAL RECORD NO.:  [id number]                   PATIENT TYPE:  INP   LOCATION:  3735                                 FACILITY:  [facility]   PHYSICIAN:  Colleen Can. Deborah Chalk, M.D.            DATE OF BIRTH:  1926-05-16   DATE OF ADMISSION:  12/03/2002  DATE OF DISCHARGE:                                HISTORY & PHYSICAL   CHIEF COMPLAINT:  Significant weakness and fatigue with associated shortness  of breath.   HISTORY OF PRESENT ILLNESS:  The patient is a 77 year old female who has  multiple medical problems which include nonischemic cardiomyopathy. She has  had previous biventricular pacemaker implanted in October 2003. Earlier this  year she was started on low dose ARB therapy, which was tolerated without  problems. She developed a sinus infection towards the mid part of January  and at that time was complaining of coughing up green sputum as well as  significant headache. She was treated with Zithromax. She was subsequently  seen back in the office one week ago with chest pain and had a small  pericardial effusion documented by 2-D echocardiogram. She continued to have  significant phlegm production. She was started on Tequin as well as Vioxx  and given Tussionex for a cough suppressant.   She presents back to our office for a follow-up visit today on 12/03/02. She  has basically continued to deteriorate with outpatient therapy. She has  become progressively weak. She is quite light-headed upon standing. She is  tremulous. She is not eating. She has been drinking a considerable amount of  juice and notes that she has been thirsty. She has had no appetite. She has  not been able to check her blood sugars since she ran out of strips. She  continues to cough with occasional production of green sputum that is now  blood tinged. She continues to have bloody rhinorrhea as well. She was seen  in the office. She  actually was unable to drive for safety reasons as well  as for further stabilization. She was admitted to the hospital for further  evaluation.   PAST MEDICAL HISTORY:  1. Cardiomyopathy with severe LV dysfunction with previous cardiac     catheterization dating back to 1994 revealing normal coronaries.  2. Non-insulin dependent diabetes.  3. Left bundle branch block.  4. Hyperlipidemia.  5. Hypertension.  6. History of Meniere's disease.  7. Recent implantation of a biventricular Medtronic pacemaker in October     2003.   ALLERGIES:  None.   CURRENT MEDICATIONS:  Diovan 160 mg a day, Lanoxin 0.25 daily, aspirin  daily, Lopid 600 b.i.d., Glucovance 2.5 b.i.d., vitamin E daily, Lipitor 10  mg a day, Vioxx 25 mg a day. She has just finished a seven day course of  Tequin 400 mg.   FAMILY HISTORY:  Noncontributory.   SOCIAL HISTORY:  She  is a widow. She lives alone. There is no smoking, no  alcohol use.   REVIEW OF SYMPTOMS:  As stated above and is otherwise unremarkable.   PHYSICAL EXAMINATION:  GENERAL:  She is quite pale and tremulous. She is  able to answer appropriately and does demonstrate some mild dyspnea with  even short sentences.  VITAL SIGNS:  Her weight is down to 120 pounds. Blood pressure 140/70  sitting, 140/80 standing, heart rate 76, respirations 22, O2 saturation on  room air was 94%.  SKIN:  Warm and dry. Her color was extremely sallow and pale.  LUNGS:  Basically clear to auscultate.  CARDIAC:  Regular rhythm. There was no rub that could be appreciated.  ABDOMEN:  Soft. Positive bowel sounds. Nontender.  EXTREMITIES:  Without edema.  NEUROLOGIC:  Intact. There are no gross focal deficits.   LABORATORY DATA:  Pending.   OVERALL IMPRESSION:  1. Significant weakness and fatigue.  2. Shortness of breath.  3. Presyncope.  4. Recent URI.  5. Recently documented small pericardial effusion currently on Vioxx.  6. Non-insulin dependent diabetes.  7.  Nonischemic cardiomyopathy.  8. Functioning biventricular pacemaker.   PLAN:  She will be admitted to the hospital. Will hydrate with IV fluids.  Will check complete lab work. Will proceed on with CT scan of the chest as  well as the initiation of IV antibiotics. The further plan is per Dr.  Ronnald Nian discretion.      Juanell Fairly C. Earl Gala, N.P.                 Colleen Can. Deborah Chalk, M.D.    LCO/MEDQ  D:  12/03/2002  T:  12/03/2002  Job:  [id number]   cc:   Gypsy Balsam  [address]  [place] [address]  Fax: [phone]

## 2011-03-19 NOTE — H&P (Signed)
NAME:  Grace Robinson, Grace Robinson NO.:  [id number]   MEDICAL RECORD NO.:  [id number]                    PATIENT TYPE:   LOCATION:                                       FACILITY:   PHYSICIAN:  Colleen Can. Deborah Chalk, M.D.            DATE OF BIRTH:  01-17-1926   DATE OF ADMISSION:  08/07/2002  DATE OF DISCHARGE:                                HISTORY & PHYSICAL   CHIEF COMPLAINT:  Exertional fatigue.   HISTORY OF PRESENT ILLNESS:  The patient is a very pleasant 77 year old  white female who has had long-standing history of cardiomyopathy in the  setting of a left bundle branch block. She has most recently undergone 2-D  echocardiogram which showed left ventricular dysfunction with global  hypokinesis which was left ventricular dyssynergy as well as anteroseptal  hypokinesia. There was also documented 2+ mitral regurgitation. Ejection  fraction was estimated to be 20 to 25%. She does have known left bundle  branch block. These findings would suggest that she would benefit from  resynchronization therapy with biventricular pacemaker implantation. From a  clinical standpoint, she does have weakness and fatigue with exertion. She  has had no chest pain. There has been no frank syncope.   PAST MEDICAL HISTORY:  1. History of cardiomyopathy with prior history of cardiac catheterization     in 1994 revealing normal coronaries with a dilated cardiomyopathy and     left ventricular dysfunction.  2. Noninsulin-dependent diabetes.  3. Left bundle branch block.  4. Hyperlipidemia.  5. Hypertension, currently controlled.  6. Previous cataract surgery.  7. Compensated congestive heart failure.  8. Past history of Meniere's' disease with residual vertigo.   ALLERGIES:  None.   CURRENT MEDICATIONS:  1. Lasix 40 mg p.r.n.  2. Lanoxin 0.25 mg daily.  3. Zestril 20 mg a day.  4. Potassium 10 mEq p.r.n. with Lasix.  5. Aspirin daily.  6. Lopid 600 b.i.d.  7. Mag-Tab  p.r.n.  8. Glucovance 2.5 b.i.d.  9. Vitamin E, multivitamin daily.  10.      Lipitor 10 mg a day.   FAMILY HISTORY:  Noncontributory.   SOCIAL HISTORY:  She is a widow. She lives alone. She is a nonsmoker.   REVIEW OF SYMPTOMS:  As stated above and is otherwise unremarkable.   PHYSICAL EXAMINATION:  GENERAL:  She is a very pleasant female who appears  younger than her stated age. Weight is 127 pounds, blood pressure 140/80  sitting-130/86 standing, heart rate is 88 and regular, respirations 18. She  is afebrile.  SKIN:  Warm and dry. Color is unremarkable.  NECK:  Supple.  LUNGS:  Clear.  HEART:  Shows a regular rhythm. There was no S3 that could be appreciated.  ABDOMEN:  Soft, positive bowel sounds, nontender without masses.  EXTREMITIES:  Show no evidence of edema.  NEUROLOGIC:  Appears to be intact. There are no gross  focal deficits.   LABORATORY DATA:  Pending.   IMPRESSION:  1. Cardiomyopathy with severe LV dysfunction.  2. Left bundle branch block.  3. Hypertension.  4. Diabetes.  5. Hyperlipidemia.   PLAN:  Will proceed on with implantation of a biventricular pacemaker  system. The procedure has been discussed in full detail, and she is willing  to proceed.     Juanell Fairly C. Earl Gala, N.P.                 Colleen Can. Deborah Chalk, M.D.    LCO/MEDQ  D:  07/30/2002  T:  08/01/2002  Job:  [id number]   cc:   Gordy Savers, M.D. [facility]

## 2011-03-19 NOTE — Cardiovascular Report (Signed)
Grace Robinson, Grace Robinson                ACCOUNT NO.:  [id number]   MEDICAL RECORD NO.:  [id number]          PATIENT TYPE:  OIB   LOCATION:  6501                         FACILITY:  [facility]   PHYSICIAN:  Colleen Can. Deborah Chalk, M.D.DATE OF BIRTH:  [birth year]   DATE OF PROCEDURE:  11/11/2004  DATE OF DISCHARGE:                              CARDIAC CATHETERIZATION   HISTORY:  Ms. Blatt is a 77 year old female with known congestive  cardiomyopathy.  It has been nonischemic many years ago at the time of  catheterization.  She subsequently has had a biventricular pacer placed  and, because of markedly reduced ejection fraction, she is now referred for  consideration for an ICD placement.   PROCEDURE:  Left heart catheterization with selective coronary angiography,  left ventricular angiography, biplane left ventricular angiography.   TYPE AND SITE OF INJECTION:  Percutaneous right femoral artery.   CATHETERS:  4-French Judkins right and left coronary catheters, 4-French  pigtail, ventriculogram catheter.   CONTRAST MATERIAL:  Omnipaque.   MEDICATIONS GIVEN PRIOR TO PROCEDURE:  Valium 10 mg p.o.   MEDICATIONS GIVEN DURING PROCEDURE:  Versed 2 mg IV.   COMMENTS:  The patient tolerated the procedure well, she was nice and  sedated with the Versed.   HEMODYNAMIC DATA:  The aortic pressure was 141/72, LV was 147/7-9.  There  was not felt to be any aortic valve gradient.   ANGIOGRAPHIC DATA:  Left ventricular angiograms were performed in the RAO  and LAO projections.  There was left ventricular enlargement and global  hypokinesia present.  There did not appear to be any significant mitral  regurgitation.  It appeared that contractility was reasonable well  synchronized in spite of what appeared to be a rather high position on the  anterior wall of the left ventricular lead relative to the ideal location on  the posterolateral wall.  In spite of this, it appeared that synchronization  was  satisfactory but there remained global left ventricular dysfunction.  The ejection fraction was estimated to be 20%.   1.  Right coronary artery.  The right coronary artery is a large dominant      vessel.  It is somewhat tortuous but essentially normal throughout the      main portion of the right coronary artery.  There is a distal branch of      the posterior descending vessel that seemingly fills late and certainly      is narrowed and would involve some small distal vessel disease.  This      would be approximately at the level of a less than 1-mm vessel.  The      right coronary artery does supply a large distribution.  2.  Left main coronary artery is normal.  3.  Left circumflex.  The left circumflex is relatively large and tortuous,      bifurcating the obtuse marginal vessel.  There are minor irregularities      in this vessel.  There is 30% narrowing and a very proximal small to      moderate sized obtuse marginal.  4.  Left anterior descending.  The left anterior descending is a reasonably      large vessel that extends to and around the apex.  There is a large      second diagonal vessel.  There is 20-30% narrowing in the distal      diagonal vessel.  There is no obstructive disease in the left anterior      descending.   OVERALL IMPRESSION:  1.  Severe left ventricular dysfunction with global hypokinesia with grossly-      appearing satisfactory resynchronization.  2.  Minimal coronary atherosclerosis with distal vessel disease in a small      branch of the distal right coronary artery, 20-30% narrowing in a high      obtuse marginal, and 20-30% narrowing in a second diagonal vessel.   DISCUSSION:  These findings are all consistent with a nonischemic  cardiomyopathy.  We will proceed on with ICD implantation.       SNT/MEDQ  D:  11/11/2004  T:  11/11/2004  Job:  [id number]   cc:   Duke Salvia, M.D.   Gordy Savers, M.D. [facility]   Gwen Pounds, MD  Fax:  [phone]

## 2011-04-02 ENCOUNTER — Telehealth: Payer: Self-pay | Admitting: Cardiology

## 2011-04-02 ENCOUNTER — Telehealth: Payer: Self-pay | Admitting: Internal Medicine

## 2011-04-02 NOTE — Telephone Encounter (Signed)
Called in wanting to speak about several things one of which was her last Defib reading. Please call back. I have pulled the chart.

## 2011-04-02 NOTE — Telephone Encounter (Signed)
Pt is former pt of Dr. Deborah Chalk.  Sent first transmission to our group 2-3 weeks ago.  She was having problem with phone lines at that time.  She wants to make sure this was received.  Cable un hooked  All lines and she is not sure if they were hooked up correctly.

## 2011-04-02 NOTE — Telephone Encounter (Signed)
Transmission received, patient aware. 

## 2011-04-02 NOTE — Telephone Encounter (Signed)
Pt would like to know if we received last defib check.  RN informed pt to f/u with Dr. Odessa Fleming office and was given his number.

## 2011-04-08 ENCOUNTER — Encounter: Payer: Self-pay | Admitting: *Deleted

## 2011-04-08 ENCOUNTER — Encounter: Payer: Self-pay | Admitting: Cardiology

## 2011-04-10 ENCOUNTER — Other Ambulatory Visit: Payer: Self-pay | Admitting: Cardiology

## 2011-04-12 ENCOUNTER — Other Ambulatory Visit: Payer: Self-pay | Admitting: *Deleted

## 2011-04-12 MED ORDER — GEMFIBROZIL 600 MG PO TABS: 600.0000 mg | ORAL_TABLET | Freq: Two times a day (BID) | ORAL | Status: DC

## 2011-04-12 NOTE — Telephone Encounter (Signed)
Fax received from pharmacy. Refill completed. Jodette Zaevion Parke RN  

## 2011-04-13 ENCOUNTER — Other Ambulatory Visit: Payer: Self-pay | Admitting: *Deleted

## 2011-04-13 NOTE — Telephone Encounter (Signed)
Error, duplicate order Alfonso Ramus RN

## 2011-05-13 ENCOUNTER — Encounter: Payer: Self-pay | Admitting: Cardiology

## 2011-05-19 ENCOUNTER — Encounter: Payer: Self-pay | Admitting: Cardiology

## 2011-05-19 ENCOUNTER — Ambulatory Visit (INDEPENDENT_AMBULATORY_CARE_PROVIDER_SITE_OTHER): Payer: Medicare PPO | Admitting: Cardiology

## 2011-05-19 DIAGNOSIS — I10 Essential (primary) hypertension: Secondary | ICD-10-CM | POA: Insufficient documentation

## 2011-05-19 DIAGNOSIS — I428 Other cardiomyopathies: Secondary | ICD-10-CM

## 2011-05-19 NOTE — Progress Notes (Signed)
HPI The patient presents for followup of her nonischemic cardiomyopathy. I reviewed all of her records from the tensor was made available. I reviewed multiple records including a catheterization and echo from the hospital. She's had a nonischemic cardiomyopathy with an ejection fraction of 25% in the past. This was documented in 2004 in 2006. However, by 2009 her ejection fraction was 55-60%. This was after ICD/CRT-D and revisions and aggressive medical therapy. She is now going to be followed in this office. She previously saw Dr. Deborah Chalk.  The patient denies any new symptoms such as chest discomfort, neck or arm discomfort. There has been no new shortness of breath, PND or orthopnea. There have been no reported palpitations, presyncope or syncope.  She is active and exercises.  She does have fatigue but her sleep is interrupted.    Allergies  Allergen Reactions  . Carvedilol     Current Outpatient Prescriptions  Medication Sig Dispense Refill  . amLODipine (NORVASC) 5 MG tablet Take 5 mg by mouth daily.        Marland Kitchen aspirin 325 MG tablet Take 325 mg by mouth as needed.        Marland Kitchen atorvastatin (LIPITOR) 20 MG tablet Take 20 mg by mouth daily.        . furosemide (LASIX) 20 MG tablet Take 20 mg by mouth as needed.        Marland Kitchen gemfibrozil (LOPID) 600 MG tablet Take 1 tablet (600 mg total) by mouth 2 (two) times daily.  60 tablet  5  . hyoscyamine (LEVSIN) 0.125 MG/5ML ELIX Take 125 mcg by mouth as needed.        . metoprolol (TOPROL-XL) 100 MG 24 hr tablet Take 100 mg by mouth 2 (two) times daily.        . Multiple Vitamins-Minerals (CENTRUM SILVER PO) Take by mouth daily.        . potassium chloride (KLOR-CON) 10 MEQ CR tablet Take 10 mEq by mouth as needed.        . sitaGLIPtan-metformin (JANUMET) 50-500 MG per tablet Take 1 tablet by mouth 2 (two) times daily.        . valsartan (DIOVAN) 320 MG tablet Take 320 mg by mouth daily.          Past Medical History  Diagnosis Date  . Ischemic  cardiomyopathy   . Biventricular ICD (implantable cardiac defibrillator) in place   . Diabetes mellitus   . HTN (hypertension)   . Dyslipidemia   . Ulcerative colitis   . Meniere's disease   . LBBB (left bundle branch block)     chronic    Past Surgical History  Procedure Date  . Cardiac catheterization   . Pacemaker insertion     Medtronic, remote- no.   . Abdominal hysterectomy   . Tonsillectomy and adenoidectomy     ROS:  As stated in the HPI and negative for all other systems.  PHYSICAL EXAM BP 162/88  Pulse 73  Resp 16  Ht 5\' 6"  (1.676 m)  Wt 132 lb (59.875 kg)  BMI 21.31 kg/m2 GENERAL:  Well appearing HEENT:  Pupils equal round and reactive, fundi not visualized, oral mucosa unremarkable NECK:  No jugular venous distention, waveform within normal limits, carotid upstroke brisk and symmetric, no bruits, no thyromegaly LYMPHATICS:  No cervical, inguinal adenopathy LUNGS:  Clear to auscultation bilaterally BACK:  No CVA tenderness CHEST:  ICD HEART:  PMI not displaced or sustained,S1 and S2 within normal limits, no S3, no S4,  no clicks, no rubs, no murmurs ABD:  Flat, positive bowel sounds normal in frequency in pitch, no bruits, no rebound, no guarding, no midline pulsatile mass, no hepatomegaly, no splenomegaly EXT:  2 plus pulses throughout, no edema, no cyanosis no clubbing SKIN:  No rashes no nodules NEURO:  Cranial nerves II through XII grossly intact, motor grossly intact throughout PSYCH:  Cognitively intact, oriented to person place and time  EKG:  NSR with vent pacing  ASSESSMENT AND PLAN

## 2011-05-19 NOTE — Patient Instructions (Signed)
Please continue all medications as ordered Follow up with Dr Antoine Poche in 06/2012

## 2011-05-19 NOTE — Assessment & Plan Note (Signed)
Interestingly her ejection fraction was normal when last assessed in 2010. It had been 25%. She has absolutely no symptoms. She should continue the meds as listed. I don't think further imaging is indicated.

## 2011-05-19 NOTE — Assessment & Plan Note (Signed)
The blood pressure continues to be high. I have instructed the patient to record a blood pressure diary and recording this. This will be presented for my review and pending these results I will make further suggestions about changes in therapy for optimal blood pressure control.  

## 2011-06-08 ENCOUNTER — Other Ambulatory Visit: Payer: Self-pay | Admitting: Dermatology

## 2011-06-10 ENCOUNTER — Encounter: Payer: Self-pay | Admitting: Internal Medicine

## 2011-06-10 ENCOUNTER — Other Ambulatory Visit: Payer: Self-pay | Admitting: Internal Medicine

## 2011-06-10 ENCOUNTER — Ambulatory Visit (INDEPENDENT_AMBULATORY_CARE_PROVIDER_SITE_OTHER): Payer: Medicare PPO | Admitting: *Deleted

## 2011-06-10 DIAGNOSIS — I5022 Chronic systolic (congestive) heart failure: Secondary | ICD-10-CM

## 2011-06-10 DIAGNOSIS — I428 Other cardiomyopathies: Secondary | ICD-10-CM

## 2011-06-10 LAB — REMOTE ICD DEVICE
ATRIAL PACING ICD: 12.03 pct
BATTERY VOLTAGE: 3.02 V
CHARGE TIME: 9.519 s
FVT: 0
LV LEAD IMPEDENCE ICD: 408 Ohm
PACEART VT: 0
RV LEAD IMPEDENCE ICD: 376 Ohm
TOT-0001: 1
TOT-0002: 0
TZAT-0001FASTVT: 1
TZAT-0002ATACH: NEGATIVE
TZAT-0002ATACH: NEGATIVE
TZAT-0004FASTVT: 8
TZAT-0004SLOWVT: 8
TZAT-0005SLOWVT: 88 pct
TZAT-0011SLOWVT: 10 ms
TZAT-0012ATACH: 150 ms
TZAT-0012FASTVT: 200 ms
TZAT-0012SLOWVT: 200 ms
TZAT-0013SLOWVT: 2
TZAT-0018ATACH: NEGATIVE
TZAT-0018SLOWVT: NEGATIVE
TZAT-0019ATACH: 6 V
TZAT-0019FASTVT: 8 V
TZAT-0019SLOWVT: 8 V
TZAT-0019SLOWVT: 8 V
TZAT-0020SLOWVT: 1.5 ms
TZON-0003ATACH: 350 ms
TZON-0003SLOWVT: 460 ms
TZON-0003VSLOWVT: 460 ms
TZON-0004SLOWVT: 16
TZON-0004VSLOWVT: 20
TZON-0005SLOWVT: 12
TZST-0001ATACH: 5
TZST-0001ATACH: 6
TZST-0001FASTVT: 3
TZST-0001FASTVT: 4
TZST-0001FASTVT: 6
TZST-0001SLOWVT: 3
TZST-0001SLOWVT: 6
TZST-0002ATACH: NEGATIVE
TZST-0003FASTVT: 35 J
TZST-0003FASTVT: 35 J
TZST-0003SLOWVT: 15 J
TZST-0003SLOWVT: 35 J
VENTRICULAR PACING ICD: 99.97 pct
VF: 0

## 2011-06-14 NOTE — Progress Notes (Signed)
ICD remote with ICM 

## 2011-06-23 ENCOUNTER — Encounter: Payer: Self-pay | Admitting: *Deleted

## 2011-07-19 ENCOUNTER — Telehealth: Payer: Self-pay | Admitting: *Deleted

## 2011-07-19 NOTE — Telephone Encounter (Signed)
Pt calling stating that her heart is beating so fast she thinks it is choking her. Pt said last time she had this choking feeling it was a problem with one of her leads. Pt said BP seems normal, put her pulse is all over the place, going down to the 40's. Pt said she has had these symptoms for 2 weeks and kept thinking it would just go away but it is really getting to be a problem for pt. Pt would like to be seen today or tomorrow to see MD. Please return pt call to discuss further.

## 2011-07-19 NOTE — Telephone Encounter (Signed)
I spoke with the patient. She states that for the last 2 weeks she has been noticing that she feels like she is choking. She states that when she has been feeling this way, she has checked her pulse and been getting readings in the 40's. She states that she has had problems with a lead in the past and she is very concerned. She will come in the morning at 8:30am for an appointment with Dr. Graciela Husbands.

## 2011-07-20 ENCOUNTER — Encounter: Payer: Self-pay | Admitting: Internal Medicine

## 2011-07-20 ENCOUNTER — Ambulatory Visit (INDEPENDENT_AMBULATORY_CARE_PROVIDER_SITE_OTHER): Payer: Medicare PPO | Admitting: Internal Medicine

## 2011-07-20 DIAGNOSIS — I428 Other cardiomyopathies: Secondary | ICD-10-CM

## 2011-07-20 DIAGNOSIS — I509 Heart failure, unspecified: Secondary | ICD-10-CM

## 2011-07-20 DIAGNOSIS — R002 Palpitations: Secondary | ICD-10-CM

## 2011-07-20 DIAGNOSIS — I10 Essential (primary) hypertension: Secondary | ICD-10-CM

## 2011-07-20 LAB — BASIC METABOLIC PANEL
CO2: 23 mEq/L (ref 19–32)
Glucose, Bld: 161 mg/dL — ABNORMAL HIGH (ref 70–99)
Sodium: 142 mEq/L (ref 135–145)

## 2011-07-20 LAB — TSH: TSH: 1.23 u[IU]/mL (ref 0.35–5.50)

## 2011-07-20 MED ORDER — FUROSEMIDE 20 MG PO TABS: ORAL_TABLET | ORAL | Status: DC

## 2011-07-20 MED ORDER — POTASSIUM CHLORIDE ER 10 MEQ PO TBCR: EXTENDED_RELEASE_TABLET | ORAL | Status: DC

## 2011-07-21 NOTE — Assessment & Plan Note (Addendum)
[facility]                        ELECTROPHYSIOLOGY OFFICE NOTE  RICHARD, HOLZ                       MRN:          [id number] DATE:07/20/2011                            DOB:          09/26/1926   HISTORY OF PRESENT ILLNESS:  Ms. Brophy is seen as an add-on today because of feeling of choking in her throat.  She has also noted that her blood pressure has been elevated.  When she recorded her blood pressure at home, her heart rates have been in the 40s.  She has a history of nonischemic cardiomyopathy with multiple device procedures.  As it is true today, the computer systems are not available for information recall.  She describes a nonischemic cardiomyopathy with initially pacemaker implanted, subsequent upgrade to a defibrillator and then CRT.  MEDICATIONS:  Amlodipine, aspirin, Lipitor, Lasix, Lopid, potassium, Toprol, Janumet, and Diovan.  I should note her Lasix is p.r.n.  PHYSICAL EXAMINATION:  VITAL SIGNS:  Her blood pressure is 161/83, her pulse is 75, and weight is 130. NECK:  Veins were flat. LUNGS:  Clear. HEART:  Sounds were regular without murmurs or gallops. ABDOMEN:  Soft. EXTREMITIES:  No edema. SKIN:  Warm and dry.  INTERROGATION:  Interrogation of her Concerto Medtronic device demonstrated a couple of episodes of nonsustained ventricular tachycardia.  Her detection is 18 intervals.  On device interrogation, ventricular pacing was associated with the recreation of her symptoms.  IMPRESSION: 1. Palpitations, likely related to ventricular tachycardia/premature     ventricular contractions and probably in the context of bigeminy. 2. Nonischemic cardiomyopathy. 3. Previously implanted CRT-D. 4. Hypertension.  PLAN:  We will plan to use Holter monitor to clarify the mechanism of her palpitations.  We will check potassium and magnesium level to see if there is an underlying electrical milleau that has prompted the  change in her situation.  We will also check her TSH.  We will increase her Lasix from p.r.n. to every other day to help with her blood pressure.  We will decrease her aspirin from 325 to 81 mg daily.  We will see her again as scheduled.    Duke Salvia, MD, [facility] Electronically Signed   SCK/MedQ  DD: 07/20/2011  DT: 07/20/2011  Job #: [id number]

## 2011-07-22 ENCOUNTER — Other Ambulatory Visit: Payer: Self-pay | Admitting: Cardiology

## 2011-07-26 ENCOUNTER — Encounter (INDEPENDENT_AMBULATORY_CARE_PROVIDER_SITE_OTHER): Payer: Medicare PPO

## 2011-07-26 DIAGNOSIS — R002 Palpitations: Secondary | ICD-10-CM

## 2011-07-27 ENCOUNTER — Telehealth: Payer: Self-pay | Admitting: Internal Medicine

## 2011-07-27 NOTE — Progress Notes (Signed)
dictated

## 2011-07-27 NOTE — Telephone Encounter (Signed)
Pt calling wanting to know results of pt lab work. Please return pt call to discuss further. Pt will in the office tomorrow to remove Holter monitor and pt didn't know if someone could discuss lab results with her tomorrow. Please return pt call.

## 2011-07-27 NOTE — Telephone Encounter (Signed)
The patient is aware of her results.  

## 2011-08-02 HISTORY — PX: CARDIAC CATHETERIZATION: SHX172

## 2011-08-05 ENCOUNTER — Telehealth: Payer: Self-pay | Admitting: *Deleted

## 2011-08-05 NOTE — Telephone Encounter (Signed)
I called the patient earlier this afternoon to let her know about her holter results & Dr. Odessa Fleming recommendations that we could d/c beta blockers and start calcium channel blockers. In talking with the patient, she is already on amlodipine 5mg  once daily. I reviewed this with Dr. Graciela Husbands and he states the next step would be antiarrhythmics. He states that since her symptoms are more of a nuisance to her than anything, he would not recommend antiarrhythmics at this time due to the risk vs. Benefit with these drugs. The patient was leaving her house tonight at 5:45pm to go to the funeral home. I will ask the message nurse to call her back tomorrow to followup on this since Dr. Graciela Husbands did not get to review until around 6pm. I have left her holter report on the message cart.

## 2011-08-06 ENCOUNTER — Telehealth: Payer: Self-pay | Admitting: *Deleted

## 2011-08-06 NOTE — Telephone Encounter (Signed)
10/5--phoned Grace Robinson and advised her that dr Graciela Husbands would rather her stay on present med regime as starting antiarrthymic may be more risk than benefit--pt agrees and states she comprehends--nt

## 2011-08-20 ENCOUNTER — Encounter: Payer: Self-pay | Admitting: Internal Medicine

## 2011-09-09 ENCOUNTER — Encounter: Payer: Medicare PPO | Admitting: *Deleted

## 2011-10-21 ENCOUNTER — Encounter: Payer: Self-pay | Admitting: Internal Medicine

## 2011-10-21 ENCOUNTER — Other Ambulatory Visit: Payer: Self-pay | Admitting: Internal Medicine

## 2011-10-21 ENCOUNTER — Ambulatory Visit (INDEPENDENT_AMBULATORY_CARE_PROVIDER_SITE_OTHER): Payer: Medicare HMO | Admitting: *Deleted

## 2011-10-21 DIAGNOSIS — I5022 Chronic systolic (congestive) heart failure: Secondary | ICD-10-CM

## 2011-10-21 DIAGNOSIS — I428 Other cardiomyopathies: Secondary | ICD-10-CM

## 2011-10-28 LAB — REMOTE ICD DEVICE
ATRIAL PACING ICD: 17.15 pct
BAMS-0001: 170 {beats}/min
BRDY-0003LV: 120 {beats}/min
CHARGE TIME: 9.519 s
FVT: 0
LV LEAD IMPEDENCE ICD: 400 Ohm
TOT-0001: 1
TOT-0002: 0
TZAT-0001ATACH: 1
TZAT-0001FASTVT: 1
TZAT-0002ATACH: NEGATIVE
TZAT-0004FASTVT: 8
TZAT-0004SLOWVT: 8
TZAT-0005SLOWVT: 88 pct
TZAT-0011FASTVT: 10 ms
TZAT-0011SLOWVT: 10 ms
TZAT-0011SLOWVT: 10 ms
TZAT-0012ATACH: 150 ms
TZAT-0012ATACH: 150 ms
TZAT-0012FASTVT: 200 ms
TZAT-0012SLOWVT: 200 ms
TZAT-0013SLOWVT: 2
TZAT-0018ATACH: NEGATIVE
TZAT-0018ATACH: NEGATIVE
TZAT-0018SLOWVT: NEGATIVE
TZAT-0019ATACH: 6 V
TZAT-0019ATACH: 6 V
TZAT-0019SLOWVT: 8 V
TZAT-0020FASTVT: 1.5 ms
TZAT-0020SLOWVT: 1.5 ms
TZON-0003ATACH: 350 ms
TZON-0003FASTVT: 240 ms
TZON-0003SLOWVT: 460 ms
TZON-0003VSLOWVT: 460 ms
TZON-0005SLOWVT: 12
TZST-0001ATACH: 4
TZST-0001ATACH: 6
TZST-0001FASTVT: 4
TZST-0001FASTVT: 5
TZST-0001SLOWVT: 6
TZST-0002ATACH: NEGATIVE
TZST-0002ATACH: NEGATIVE
TZST-0003FASTVT: 35 J
TZST-0003FASTVT: 35 J
TZST-0003FASTVT: 35 J
TZST-0003SLOWVT: 15 J
TZST-0003SLOWVT: 35 J
VENTRICULAR PACING ICD: 99.63 pct
VF: 0

## 2011-10-29 LAB — REMOTE ICD DEVICE
ATRIAL PACING ICD: 17.15 pct
BAMS-0001: 170 {beats}/min
BRDY-0002LV: 60 {beats}/min
DEV-0020ICD: NEGATIVE
FVT: 0
LV LEAD THRESHOLD: 1 V
PACEART VT: 0
TOT-0001: 1
TOT-0002: 0
TOT-0006: 20090630000000
TZAT-0001ATACH: 1
TZAT-0001ATACH: 2
TZAT-0001ATACH: 3
TZAT-0002ATACH: NEGATIVE
TZAT-0004SLOWVT: 8
TZAT-0005SLOWVT: 88 pct
TZAT-0011FASTVT: 10 ms
TZAT-0012ATACH: 150 ms
TZAT-0012ATACH: 150 ms
TZAT-0012FASTVT: 200 ms
TZAT-0012SLOWVT: 200 ms
TZAT-0013FASTVT: 1
TZAT-0013SLOWVT: 2
TZAT-0018ATACH: NEGATIVE
TZAT-0019ATACH: 6 V
TZAT-0019FASTVT: 8 V
TZAT-0019SLOWVT: 8 V
TZAT-0019SLOWVT: 8 V
TZAT-0020ATACH: 1.5 ms
TZAT-0020ATACH: 1.5 ms
TZAT-0020FASTVT: 1.5 ms
TZAT-0020SLOWVT: 1.5 ms
TZON-0003ATACH: 350 ms
TZON-0003SLOWVT: 460 ms
TZON-0003VSLOWVT: 460 ms
TZON-0005SLOWVT: 12
TZST-0001ATACH: 4
TZST-0001ATACH: 6
TZST-0001FASTVT: 2
TZST-0001FASTVT: 3
TZST-0001FASTVT: 4
TZST-0001FASTVT: 5
TZST-0001SLOWVT: 3
TZST-0001SLOWVT: 4
TZST-0001SLOWVT: 5
TZST-0002ATACH: NEGATIVE
TZST-0003FASTVT: 35 J
TZST-0003FASTVT: 35 J
TZST-0003SLOWVT: 15 J
TZST-0003SLOWVT: 35 J
VENTRICULAR PACING ICD: 99.63 pct
VF: 0

## 2011-10-29 NOTE — Progress Notes (Signed)
ICD remote with ICM 

## 2011-11-17 ENCOUNTER — Encounter: Payer: Self-pay | Admitting: *Deleted

## 2011-11-23 ENCOUNTER — Other Ambulatory Visit: Payer: Self-pay

## 2011-11-23 MED ORDER — GEMFIBROZIL 600 MG PO TABS: 600.0000 mg | ORAL_TABLET | Freq: Two times a day (BID) | ORAL | Status: DC

## 2011-11-23 NOTE — Telephone Encounter (Signed)
..   Requested Prescriptions   Signed Prescriptions Disp Refills  . gemfibrozil (LOPID) 600 MG tablet 60 tablet 5    Sig: Take 1 tablet (600 mg total) by mouth 2 (two) times daily.    Authorizing Provider: Rollene Rotunda    Ordering User: Christella Hartigan, Jhene Westmoreland Judie Petit

## 2011-12-06 ENCOUNTER — Encounter: Payer: Self-pay | Admitting: Internal Medicine

## 2011-12-08 ENCOUNTER — Ambulatory Visit (INDEPENDENT_AMBULATORY_CARE_PROVIDER_SITE_OTHER): Payer: Medicare Other | Admitting: Internal Medicine

## 2011-12-08 ENCOUNTER — Encounter: Payer: Self-pay | Admitting: Internal Medicine

## 2011-12-08 DIAGNOSIS — I10 Essential (primary) hypertension: Secondary | ICD-10-CM

## 2011-12-08 DIAGNOSIS — I5022 Chronic systolic (congestive) heart failure: Secondary | ICD-10-CM

## 2011-12-08 DIAGNOSIS — Z95810 Presence of automatic (implantable) cardiac defibrillator: Secondary | ICD-10-CM

## 2011-12-08 DIAGNOSIS — R609 Edema, unspecified: Secondary | ICD-10-CM

## 2011-12-08 DIAGNOSIS — I471 Supraventricular tachycardia: Secondary | ICD-10-CM | POA: Insufficient documentation

## 2011-12-08 DIAGNOSIS — I428 Other cardiomyopathies: Secondary | ICD-10-CM

## 2011-12-08 DIAGNOSIS — I498 Other specified cardiac arrhythmias: Secondary | ICD-10-CM

## 2011-12-08 LAB — ICD DEVICE OBSERVATION
AL AMPLITUDE: 1.7463 mv
AL IMPEDENCE ICD: 544 Ohm
AL THRESHOLD: 1 V
BATTERY VOLTAGE: 2.96 V
CHARGE TIME: 9.819 s
LV LEAD THRESHOLD: 1 V
PACEART VT: 1
RV LEAD IMPEDENCE ICD: 392 Ohm
RV LEAD THRESHOLD: 1 V
TOT-0002: 0
TZAT-0001ATACH: 1
TZAT-0001FASTVT: 1
TZAT-0001SLOWVT: 1
TZAT-0002ATACH: NEGATIVE
TZAT-0002ATACH: NEGATIVE
TZAT-0005SLOWVT: 88 pct
TZAT-0005SLOWVT: 91 pct
TZAT-0011FASTVT: 10 ms
TZAT-0011SLOWVT: 10 ms
TZAT-0011SLOWVT: 10 ms
TZAT-0012FASTVT: 200 ms
TZAT-0012SLOWVT: 200 ms
TZAT-0013SLOWVT: 2
TZAT-0018ATACH: NEGATIVE
TZAT-0018ATACH: NEGATIVE
TZAT-0018FASTVT: NEGATIVE
TZAT-0018SLOWVT: NEGATIVE
TZAT-0019ATACH: 6 V
TZAT-0019ATACH: 6 V
TZAT-0020ATACH: 1.5 ms
TZAT-0020ATACH: 1.5 ms
TZAT-0020FASTVT: 1.5 ms
TZAT-0020SLOWVT: 1.5 ms
TZAT-0020SLOWVT: 1.5 ms
TZON-0003VSLOWVT: 460 ms
TZON-0004SLOWVT: 16
TZST-0001ATACH: 4
TZST-0001ATACH: 5
TZST-0001ATACH: 6
TZST-0001FASTVT: 2
TZST-0001FASTVT: 6
TZST-0002ATACH: NEGATIVE
TZST-0002ATACH: NEGATIVE
TZST-0003FASTVT: 25 J
TZST-0003FASTVT: 35 J
TZST-0003FASTVT: 35 J
TZST-0003FASTVT: 35 J
TZST-0003SLOWVT: 15 J
TZST-0003SLOWVT: 35 J
TZST-0003SLOWVT: 35 J
TZST-0003SLOWVT: 35 J

## 2011-12-08 NOTE — Progress Notes (Signed)
   HPI  Grace Robinson is a 78 y.o. female is seen in followup for nonischemic cardiomyopathy complicated by congestive heart failure. She is status post CRT-D implantation was further compromised by a 6949-lead which failed prompting [place] to pirate her previously implanted pacemaker lead . This was done in 2007  The patient denies chest pain, shortness of breath, nocturnal dyspnea, orthopnea   There have been no palpitations, lightheadedness or syncope. She does have peripheral edema that is dependent in progressive throughout the day.    Past Medical History  Diagnosis Date  . Ischemic cardiomyopathy   . Biventricular ICD (implantable cardiac defibrillator) in place   . Diabetes mellitus   . HTN (hypertension)   . Dyslipidemia   . Ulcerative colitis   . Meniere's disease   . LBBB (left bundle branch block)     chronic  . Colon polyps   . Cellulitis     Past Surgical History  Procedure Date  . Cardiac catheterization 08/2011  . Pacemaker insertion     Medtronic, remote- no.   . Abdominal hysterectomy   . Tonsillectomy and adenoidectomy   . Ep study 08/22/08  . Abdominal hysterectomy   . Mastoid sugery     in the setting of Meniere's disease  . Pericardial effusion 12/2002    Current Outpatient Prescriptions  Medication Sig Dispense Refill  . amLODipine (NORVASC) 5 MG tablet Take 5 mg by mouth daily.        Marland Kitchen aspirin EC 81 MG tablet Take 1 tablet (81 mg total) by mouth daily.      Marland Kitchen atorvastatin (LIPITOR) 20 MG tablet Take 20 mg by mouth daily.        . furosemide (LASIX) 20 MG tablet Take one tablet by mouth every other day as directed.  30 tablet  6  . gemfibrozil (LOPID) 600 MG tablet Take 1 tablet (600 mg total) by mouth 2 (two) times daily.  60 tablet  5  . hyoscyamine (LEVSIN) 0.125 MG/5ML ELIX Take 125 mcg by mouth as needed.        . metoprolol (TOPROL-XL) 100 MG 24 hr tablet Take 100 mg by mouth 2 (two) times daily.        . Multiple Vitamins-Minerals (CENTRUM  SILVER PO) Take by mouth daily.        . potassium chloride (K-DUR) 10 MEQ tablet Take one tablet by mouth every other day as directed.  30 tablet  6  . sitaGLIPtan-metformin (JANUMET) 50-500 MG per tablet Take 1 tablet by mouth 2 (two) times daily.        . valsartan (DIOVAN) 320 MG tablet Take 320 mg by mouth daily.          Allergies  Allergen Reactions  . Carvedilol     Review of Systems negative except from HPI and PMH  Physical Exam BP 142/68  Pulse 69  Ht 5' 6.5" (1.689 m)  Wt 131 lb 6.4 oz (59.603 kg)  BMI 20.89 kg/m2 Well developed and well nourished in no acute distress HENT normal E scleral and icterus clear Neck Supple JVP flat; carotids brisk and full Clear to ausculation Regular rate and rhythm, no murmurs gallops or rub Soft with active bowel sounds No clubbing cyanosis none Edema Alert and oriented, grossly normal motor and sensory function Skin Warm and Dry   Assessment and  Plan

## 2011-12-08 NOTE — Assessment & Plan Note (Signed)
Adequately controlled at this point concern is withdrawal of amlodipine

## 2011-12-08 NOTE — Patient Instructions (Signed)
Remote monitoring is used to monitor your Pacemaker of ICD from home. This monitoring reduces the number of office visits required to check your device to one time per year. It allows [place] to keep an eye on the functioning of your device to ensure it is working properly. You are scheduled for a device check from home on Mar 09, 2012. You may send your transmission at any time that day. If you have a wireless device, the transmission will be sent automatically. After your physician reviews your transmission, you will receive a postcard with your next transmission date.

## 2011-12-08 NOTE — Assessment & Plan Note (Signed)
This may be related to heart failure; may also be related to amlodipine. We will undertake a two-week exclusion trial and she'll let us know how her edema does

## 2011-12-08 NOTE — Assessment & Plan Note (Signed)
The patient's device was interrogated.  The information was reviewed. No changes were made in the programming.    

## 2011-12-08 NOTE — Assessment & Plan Note (Signed)
As above.

## 2011-12-08 NOTE — Assessment & Plan Note (Signed)
Stable as above 

## 2011-12-08 NOTE — Assessment & Plan Note (Signed)
follow

## 2011-12-28 ENCOUNTER — Other Ambulatory Visit: Payer: Self-pay | Admitting: *Deleted

## 2011-12-28 MED ORDER — METOPROLOL SUCCINATE ER 100 MG PO TB24: 100.0000 mg | ORAL_TABLET | Freq: Two times a day (BID) | ORAL | Status: DC

## 2012-01-27 ENCOUNTER — Encounter: Payer: Medicare PPO | Admitting: *Deleted

## 2012-03-09 ENCOUNTER — Encounter: Payer: Self-pay | Admitting: Internal Medicine

## 2012-03-09 ENCOUNTER — Ambulatory Visit (INDEPENDENT_AMBULATORY_CARE_PROVIDER_SITE_OTHER): Payer: Medicare Other | Admitting: *Deleted

## 2012-03-09 DIAGNOSIS — I5022 Chronic systolic (congestive) heart failure: Secondary | ICD-10-CM

## 2012-03-09 DIAGNOSIS — I428 Other cardiomyopathies: Secondary | ICD-10-CM

## 2012-03-09 LAB — REMOTE ICD DEVICE
AL AMPLITUDE: 1.2 mv
ATRIAL PACING ICD: 11.91 pct
BATTERY VOLTAGE: 2.93 V
BRDY-0002LV: 60 {beats}/min
BRDY-0004LV: 120 {beats}/min
LV LEAD IMPEDENCE ICD: 416 Ohm
LV LEAD THRESHOLD: 1 V
RV LEAD IMPEDENCE ICD: 368 Ohm
TZAT-0001ATACH: 2
TZAT-0001SLOWVT: 1
TZAT-0002ATACH: NEGATIVE
TZAT-0002ATACH: NEGATIVE
TZAT-0004SLOWVT: 8
TZAT-0005SLOWVT: 91 pct
TZAT-0011FASTVT: 10 ms
TZAT-0011SLOWVT: 10 ms
TZAT-0012FASTVT: 200 ms
TZAT-0012SLOWVT: 200 ms
TZAT-0013FASTVT: 1
TZAT-0013SLOWVT: 2
TZAT-0018ATACH: NEGATIVE
TZAT-0018ATACH: NEGATIVE
TZAT-0019ATACH: 6 V
TZAT-0019ATACH: 6 V
TZAT-0019FASTVT: 8 V
TZAT-0020ATACH: 1.5 ms
TZAT-0020ATACH: 1.5 ms
TZAT-0020SLOWVT: 1.5 ms
TZAT-0020SLOWVT: 1.5 ms
TZON-0003ATACH: 350 ms
TZON-0004SLOWVT: 16
TZST-0001ATACH: 4
TZST-0001ATACH: 5
TZST-0001ATACH: 6
TZST-0001FASTVT: 5
TZST-0001FASTVT: 6
TZST-0001SLOWVT: 3
TZST-0001SLOWVT: 4
TZST-0002ATACH: NEGATIVE
TZST-0002ATACH: NEGATIVE
TZST-0003FASTVT: 35 J
TZST-0003FASTVT: 35 J
TZST-0003SLOWVT: 15 J
TZST-0003SLOWVT: 35 J
TZST-0003SLOWVT: 35 J
TZST-0003SLOWVT: 35 J

## 2012-03-17 ENCOUNTER — Encounter: Payer: Self-pay | Admitting: *Deleted

## 2012-03-24 NOTE — Progress Notes (Signed)
ICD remote with ICM 

## 2012-04-06 ENCOUNTER — Ambulatory Visit: Payer: Medicare Other | Attending: Internal Medicine | Admitting: *Deleted

## 2012-04-06 DIAGNOSIS — M6281 Muscle weakness (generalized): Secondary | ICD-10-CM | POA: Insufficient documentation

## 2012-04-06 DIAGNOSIS — R269 Unspecified abnormalities of gait and mobility: Secondary | ICD-10-CM | POA: Insufficient documentation

## 2012-04-06 DIAGNOSIS — IMO0001 Reserved for inherently not codable concepts without codable children: Secondary | ICD-10-CM | POA: Insufficient documentation

## 2012-04-07 ENCOUNTER — Encounter: Payer: Self-pay | Admitting: Cardiology

## 2012-04-12 ENCOUNTER — Ambulatory Visit: Payer: Medicare Other | Admitting: Physical Therapy

## 2012-04-13 ENCOUNTER — Ambulatory Visit (INDEPENDENT_AMBULATORY_CARE_PROVIDER_SITE_OTHER): Payer: Medicare Other | Admitting: *Deleted

## 2012-04-13 DIAGNOSIS — I5022 Chronic systolic (congestive) heart failure: Secondary | ICD-10-CM

## 2012-04-14 ENCOUNTER — Ambulatory Visit: Payer: Medicare Other | Admitting: Rehabilitative and Restorative Service Providers"

## 2012-04-17 ENCOUNTER — Ambulatory Visit: Payer: Medicare Other | Admitting: Physical Therapy

## 2012-04-19 ENCOUNTER — Ambulatory Visit: Payer: Medicare Other | Admitting: Physical Therapy

## 2012-04-24 ENCOUNTER — Encounter: Payer: Medicare Other | Admitting: Physical Therapy

## 2012-04-24 ENCOUNTER — Ambulatory Visit: Payer: Medicare Other | Admitting: Physical Therapy

## 2012-04-26 ENCOUNTER — Encounter: Payer: Medicare Other | Admitting: Physical Therapy

## 2012-04-28 ENCOUNTER — Ambulatory Visit: Payer: Medicare Other | Admitting: Physical Therapy

## 2012-05-01 ENCOUNTER — Encounter: Payer: Self-pay | Admitting: *Deleted

## 2012-05-01 ENCOUNTER — Ambulatory Visit: Payer: Medicare Other | Attending: Internal Medicine | Admitting: Physical Therapy

## 2012-05-01 DIAGNOSIS — R269 Unspecified abnormalities of gait and mobility: Secondary | ICD-10-CM | POA: Insufficient documentation

## 2012-05-01 DIAGNOSIS — IMO0001 Reserved for inherently not codable concepts without codable children: Secondary | ICD-10-CM | POA: Insufficient documentation

## 2012-05-01 DIAGNOSIS — M6281 Muscle weakness (generalized): Secondary | ICD-10-CM | POA: Insufficient documentation

## 2012-05-03 ENCOUNTER — Ambulatory Visit: Payer: Medicare Other | Admitting: Physical Therapy

## 2012-05-08 ENCOUNTER — Telehealth: Payer: Self-pay | Admitting: Internal Medicine

## 2012-05-08 ENCOUNTER — Ambulatory Visit: Payer: Medicare Other | Admitting: Physical Therapy

## 2012-05-08 NOTE — Telephone Encounter (Signed)
Please return call to patient at [phone] regarding trouble shooting remote device

## 2012-05-09 NOTE — Telephone Encounter (Signed)
New Problem:    Patient is calling back again wishing to speak with someone about the letter she received.  Please call back, please feel free to leave a message.

## 2012-05-09 NOTE — Telephone Encounter (Signed)
Spoke w/pt in regards to transmission. Pt to try and send transmission today and will show SK for optivol.

## 2012-05-10 ENCOUNTER — Ambulatory Visit: Payer: Medicare Other | Admitting: Physical Therapy

## 2012-05-15 ENCOUNTER — Encounter: Payer: Medicare Other | Admitting: Physical Therapy

## 2012-05-17 ENCOUNTER — Encounter: Payer: Medicare Other | Admitting: Physical Therapy

## 2012-05-22 ENCOUNTER — Ambulatory Visit: Payer: Medicare Other | Admitting: Physical Therapy

## 2012-05-24 ENCOUNTER — Ambulatory Visit: Payer: Medicare Other | Admitting: Physical Therapy

## 2012-05-29 ENCOUNTER — Encounter: Payer: Medicare Other | Admitting: Physical Therapy

## 2012-05-31 ENCOUNTER — Encounter: Payer: Medicare Other | Admitting: Physical Therapy

## 2012-06-05 ENCOUNTER — Ambulatory Visit: Payer: Medicare Other | Admitting: Physical Therapy

## 2012-06-07 ENCOUNTER — Ambulatory Visit: Payer: Medicare Other | Admitting: Physical Therapy

## 2012-06-08 ENCOUNTER — Ambulatory Visit (INDEPENDENT_AMBULATORY_CARE_PROVIDER_SITE_OTHER): Payer: Medicare Other | Admitting: Cardiology

## 2012-06-08 ENCOUNTER — Encounter: Payer: Self-pay | Admitting: Cardiology

## 2012-06-08 VITALS — BP 160/80 | HR 70 | Ht 66.0 in | Wt 129.8 lb

## 2012-06-08 DIAGNOSIS — I5022 Chronic systolic (congestive) heart failure: Secondary | ICD-10-CM

## 2012-06-08 DIAGNOSIS — I10 Essential (primary) hypertension: Secondary | ICD-10-CM

## 2012-06-08 MED ORDER — AMLODIPINE BESYLATE 2.5 MG PO TABS: 2.5000 mg | ORAL_TABLET | Freq: Every day | ORAL | Status: AC

## 2012-06-08 NOTE — Progress Notes (Signed)
HPI The patient presents for followup of her nonischemic cardiomyopathy. Since I last saw her she has done well.  She did have balance issues and has been in the balance clinic.  The patient denies any new symptoms such as chest discomfort, neck or arm discomfort. There has been no new shortness of breath, PND or orthopnea. There have been no reported palpitations, presyncope or syncope.  She did have some leg swelling when she saw Dr. Graciela Husbands earlier this year.  He stopped her Norvasc and increased the Lasix which she is now taking twice daily.  Her swelling is improved. However, her BP has been up by her report.  Allergies  Allergen Reactions  . Carvedilol   . Sudafed (Pseudoephedrine)   . Vancomycin     Current Outpatient Prescriptions  Medication Sig Dispense Refill  . aspirin EC 81 MG tablet Take 1 tablet (81 mg total) by mouth daily.      Marland Kitchen atorvastatin (LIPITOR) 20 MG tablet Take 20 mg by mouth daily.        . furosemide (LASIX) 20 MG tablet Take one tablet by mouth every other day as directed.  30 tablet  6  . gemfibrozil (LOPID) 600 MG tablet Take 1 tablet (600 mg total) by mouth 2 (two) times daily.  60 tablet  5  . hyoscyamine (LEVSIN) 0.125 MG/5ML ELIX Take 125 mcg by mouth as needed.        . mesalamine (ASACOL) 400 MG EC tablet Take 400 mg by mouth daily. 32 TABS      . mesalamine (LIALDA) 1.2 G EC tablet Take 1,200 mg by mouth daily with breakfast. 2 TAB      . metoprolol succinate (TOPROL-XL) 100 MG 24 hr tablet Take 1 tablet (100 mg total) by mouth 2 (two) times daily.  60 tablet  6  . potassium chloride (K-DUR) 10 MEQ tablet Take one tablet by mouth every other day as directed.  30 tablet  6  . Probiotic Product (PROBIOTIC DAILY PO) Take by mouth daily.      . sitaGLIPtan-metformin (JANUMET) 50-500 MG per tablet Take 1 tablet by mouth 2 (two) times daily.        . valsartan (DIOVAN) 320 MG tablet Take 320 mg by mouth daily.          Past Medical History  Diagnosis Date    . Ischemic cardiomyopathy   . Biventricular ICD (implantable cardiac defibrillator) in place   . Diabetes mellitus   . HTN (hypertension)   . Dyslipidemia   . Ulcerative colitis   . Meniere's disease   . LBBB (left bundle branch block)     chronic  . Colon polyps   . Cellulitis     Past Surgical History  Procedure Date  . Cardiac catheterization 08/2011  . Pacemaker insertion     Medtronic, remote- no.   . Abdominal hysterectomy   . Tonsillectomy and adenoidectomy   . Ep study 08/22/08  . Abdominal hysterectomy   . Mastoid sugery     in the setting of Meniere's disease  . Pericardial effusion 12/2002    ROS:  As stated in the HPI and negative for all other systems.  PHYSICAL EXAM BP 160/80  Pulse 70  Ht 5\' 6"  (1.676 m)  Wt 129 lb 12.8 oz (58.877 kg)  BMI 20.95 kg/m2 GENERAL:  Well appearing HEENT:  Pupils equal round and reactive, fundi not visualized, oral mucosa unremarkable NECK:  No jugular venous distention, waveform within  normal limits, carotid upstroke brisk and symmetric, no bruits, no thyromegaly LYMPHATICS:  No cervical, inguinal adenopathy LUNGS:  Clear to auscultation bilaterally BACK:  No CVA tenderness CHEST:  ICD HEART:  PMI not displaced or sustained,S1 and S2 within normal limits, no S3, no S4, no clicks, no rubs, no murmurs ABD:  Flat, positive bowel sounds normal in frequency in pitch, no bruits, no rebound, no guarding, no midline pulsatile mass, no hepatomegaly, no splenomegaly EXT:  2 plus pulses throughout, no edema, no cyanosis no clubbing SKIN:  No rashes no nodules NEURO:  Cranial nerves II through XII grossly intact, motor grossly intact throughout PSYCH:  Cognitively intact, oriented to person place and time  EKG:  NSR with vent pacing rate70  06/08/2012  ASSESSMENT AND PLAN  CARDIOMYOPATHY, PRIMARY, DILATED -  Interestingly her ejection fraction was normal when last assessed in 2010. It had been 25%. She has absolutely no symptoms.  She should continue the meds as listed. I don't think further imaging is indicated.  She will continue the meds as listed.  HTN (hypertension) -  I would like to restart her amlodipine at a lower dose as her systolics are above 140s consistently. She'll let me know if her swelling return.

## 2012-06-08 NOTE — Patient Instructions (Addendum)
Please start Amlodipine 2.5 mg daily  Continue all other medications as listed  Follow up in 1 year with Dr Antoine Poche.  You will receive a letter in the mail 2 months before you are due.  Please call us when you receive this letter to schedule your follow up appointment.

## 2012-06-15 ENCOUNTER — Ambulatory Visit (INDEPENDENT_AMBULATORY_CARE_PROVIDER_SITE_OTHER): Payer: Medicare Other | Admitting: *Deleted

## 2012-06-15 DIAGNOSIS — I428 Other cardiomyopathies: Secondary | ICD-10-CM

## 2012-06-15 DIAGNOSIS — I5022 Chronic systolic (congestive) heart failure: Secondary | ICD-10-CM

## 2012-06-16 LAB — REMOTE ICD DEVICE
AL AMPLITUDE: 1.9166 mv
AL IMPEDENCE ICD: 520 Ohm
BAMS-0001: 170 {beats}/min
BATTERY VOLTAGE: 2.86 V
BRDY-0002LV: 60 {beats}/min
DEV-0020ICD: NEGATIVE
LV LEAD IMPEDENCE ICD: 440 Ohm
LV LEAD THRESHOLD: 1 V
RV LEAD IMPEDENCE ICD: 408 Ohm
TOT-0002: 0
TOT-0006: 20090630000000
TZAT-0001ATACH: 1
TZAT-0002ATACH: NEGATIVE
TZAT-0002ATACH: NEGATIVE
TZAT-0005SLOWVT: 88 pct
TZAT-0005SLOWVT: 91 pct
TZAT-0011FASTVT: 10 ms
TZAT-0011SLOWVT: 10 ms
TZAT-0011SLOWVT: 10 ms
TZAT-0012FASTVT: 200 ms
TZAT-0012SLOWVT: 200 ms
TZAT-0012SLOWVT: 200 ms
TZAT-0013FASTVT: 1
TZAT-0013SLOWVT: 2
TZAT-0013SLOWVT: 2
TZAT-0018ATACH: NEGATIVE
TZAT-0018SLOWVT: NEGATIVE
TZAT-0018SLOWVT: NEGATIVE
TZAT-0019ATACH: 6 V
TZAT-0019ATACH: 6 V
TZAT-0019FASTVT: 8 V
TZAT-0020ATACH: 1.5 ms
TZON-0003SLOWVT: 460 ms
TZON-0004SLOWVT: 16
TZON-0005SLOWVT: 12
TZST-0001ATACH: 5
TZST-0001FASTVT: 3
TZST-0001SLOWVT: 4
TZST-0001SLOWVT: 6
TZST-0002ATACH: NEGATIVE
TZST-0003FASTVT: 25 J
TZST-0003FASTVT: 35 J
TZST-0003FASTVT: 35 J
TZST-0003SLOWVT: 35 J
VENTRICULAR PACING ICD: 99.98 pct
VF: 0

## 2012-07-04 ENCOUNTER — Encounter: Payer: Self-pay | Admitting: *Deleted

## 2012-07-05 ENCOUNTER — Encounter: Payer: Self-pay | Admitting: Internal Medicine

## 2012-08-22 ENCOUNTER — Other Ambulatory Visit: Payer: Self-pay | Admitting: Internal Medicine

## 2012-09-06 ENCOUNTER — Other Ambulatory Visit: Payer: Self-pay | Admitting: Cardiology

## 2012-09-18 ENCOUNTER — Ambulatory Visit (INDEPENDENT_AMBULATORY_CARE_PROVIDER_SITE_OTHER): Payer: Medicare Other | Admitting: *Deleted

## 2012-09-18 ENCOUNTER — Encounter: Payer: Self-pay | Admitting: Internal Medicine

## 2012-09-18 DIAGNOSIS — Z95810 Presence of automatic (implantable) cardiac defibrillator: Secondary | ICD-10-CM

## 2012-09-18 DIAGNOSIS — I428 Other cardiomyopathies: Secondary | ICD-10-CM

## 2012-09-18 DIAGNOSIS — I5022 Chronic systolic (congestive) heart failure: Secondary | ICD-10-CM

## 2012-09-22 LAB — REMOTE ICD DEVICE
BATTERY VOLTAGE: 2.79 V
DEV-0020ICD: NEGATIVE
LV LEAD IMPEDENCE ICD: 424 Ohm
LV LEAD THRESHOLD: 1 V
PACEART VT: 1
TOT-0006: 20090630000000
VENTRICULAR PACING ICD: 99.98 pct
VF: 0

## 2012-10-11 ENCOUNTER — Encounter: Payer: Self-pay | Admitting: *Deleted

## 2012-12-08 ENCOUNTER — Encounter: Payer: Medicare Other | Admitting: Internal Medicine

## 2012-12-13 ENCOUNTER — Ambulatory Visit (INDEPENDENT_AMBULATORY_CARE_PROVIDER_SITE_OTHER): Payer: Medicare Other | Admitting: Internal Medicine

## 2012-12-13 ENCOUNTER — Encounter: Payer: Self-pay | Admitting: Internal Medicine

## 2012-12-13 VITALS — BP 159/70 | HR 72 | Ht 66.5 in | Wt 130.8 lb

## 2012-12-13 DIAGNOSIS — Z95810 Presence of automatic (implantable) cardiac defibrillator: Secondary | ICD-10-CM

## 2012-12-13 DIAGNOSIS — I428 Other cardiomyopathies: Secondary | ICD-10-CM

## 2012-12-13 DIAGNOSIS — I472 Ventricular tachycardia, unspecified: Secondary | ICD-10-CM | POA: Insufficient documentation

## 2012-12-13 DIAGNOSIS — I471 Supraventricular tachycardia: Secondary | ICD-10-CM

## 2012-12-13 DIAGNOSIS — I5022 Chronic systolic (congestive) heart failure: Secondary | ICD-10-CM

## 2012-12-13 DIAGNOSIS — I4729 Other ventricular tachycardia: Secondary | ICD-10-CM | POA: Insufficient documentation

## 2012-12-13 DIAGNOSIS — I498 Other specified cardiac arrhythmias: Secondary | ICD-10-CM

## 2012-12-13 DIAGNOSIS — R609 Edema, unspecified: Secondary | ICD-10-CM

## 2012-12-13 LAB — ICD DEVICE OBSERVATION
AL AMPLITUDE: 1.4 mv
AL IMPEDENCE ICD: 512 Ohm
ATRIAL PACING ICD: 15.88 pct
BAMS-0001: 170 {beats}/min
CHARGE TIME: 11.02 s
FVT: 0
RV LEAD AMPLITUDE: 18.4 mv
RV LEAD IMPEDENCE ICD: 408 Ohm
RV LEAD THRESHOLD: 1 V
TOT-0001: 1
TOT-0002: 0
TZAT-0001ATACH: 1
TZAT-0001ATACH: 3
TZAT-0001SLOWVT: 1
TZAT-0001SLOWVT: 2
TZAT-0002ATACH: NEGATIVE
TZAT-0002ATACH: NEGATIVE
TZAT-0005SLOWVT: 88 pct
TZAT-0005SLOWVT: 91 pct
TZAT-0011FASTVT: 10 ms
TZAT-0012ATACH: 150 ms
TZAT-0012FASTVT: 200 ms
TZAT-0013SLOWVT: 2
TZAT-0013SLOWVT: 2
TZAT-0018ATACH: NEGATIVE
TZAT-0018ATACH: NEGATIVE
TZAT-0018FASTVT: NEGATIVE
TZAT-0018SLOWVT: NEGATIVE
TZAT-0018SLOWVT: NEGATIVE
TZAT-0019ATACH: 6 V
TZAT-0019ATACH: 6 V
TZAT-0019FASTVT: 8 V
TZAT-0019SLOWVT: 8 V
TZAT-0020FASTVT: 1.5 ms
TZON-0003FASTVT: 240 ms
TZON-0004SLOWVT: 32
TZON-0005SLOWVT: 12
TZST-0001ATACH: 5
TZST-0001ATACH: 6
TZST-0001FASTVT: 3
TZST-0001FASTVT: 4
TZST-0001FASTVT: 6
TZST-0001SLOWVT: 3
TZST-0001SLOWVT: 4
TZST-0001SLOWVT: 6
TZST-0002ATACH: NEGATIVE
TZST-0002ATACH: NEGATIVE
TZST-0003FASTVT: 35 J
TZST-0003FASTVT: 35 J
TZST-0003SLOWVT: 35 J

## 2012-12-13 NOTE — Assessment & Plan Note (Signed)
The patient's device was interrogated.  The information was reviewed. No changes were made in the programming.   We reviewed the optivol data demonstrating decreasing intrathoracic resistance concordant with her worsening symptoms and suggestive of congestive heart failure

## 2012-12-13 NOTE — Progress Notes (Signed)
HPI  Grace Robinson is a 79 y.o. female is seen in followup for nonischemic cardiomyopathy complicated by congestive heart failure. She is status post CRT-D implantation was further compromised by a 6949-lead which failed prompting [place] to pirate her previously implanted pacemaker lead . This was done in 2007 there have been intercurrent normalization of LV functionwith an echo 2009 demonstrating normal left ventricular function   Over the last 3-4 months she has had more problems with fatigue shortness of breath peripheral edema and nocturia. He's had no chest pain. She was noted at that time to have a blood sugar of over 600 hemoglobin A1c over 12. When the latter was rechecked in December it was over 7.   Past Medical History  Diagnosis Date  . Ischemic cardiomyopathy   . Biventricular ICD (implantable cardiac defibrillator) in place   . Diabetes mellitus   . HTN (hypertension)   . Dyslipidemia   . Ulcerative colitis   . Meniere's disease   . LBBB (left bundle branch block)     chronic  . Colon polyps   . Cellulitis     Past Surgical History  Procedure Laterality Date  . Cardiac catheterization  08/2011  . Pacemaker insertion      Medtronic, remote- no.   . Abdominal hysterectomy    . Tonsillectomy and adenoidectomy    . Ep study  08/22/08  . Abdominal hysterectomy    . Mastoid sugery      in the setting of Meniere's disease  . Pericardial effusion  12/2002    Current Outpatient Prescriptions  Medication Sig Dispense Refill  . amLODipine (NORVASC) 2.5 MG tablet Take 1 tablet (2.5 mg total) by mouth daily.  30 tablet  11  . aspirin EC 81 MG tablet Take 1 tablet (81 mg total) by mouth daily.      Marland Kitchen atorvastatin (LIPITOR) 20 MG tablet Take 20 mg by mouth daily.        . furosemide (LASIX) 20 MG tablet Take one tablet by mouth every other day as directed.  30 tablet  6  . gemfibrozil (LOPID) 600 MG tablet Take 1 tablet (600 mg total) by mouth 2 (two) times daily.  60  tablet  5  . hyoscyamine (LEVSIN) 0.125 MG/5ML ELIX Take 125 mcg by mouth as needed.        . insulin lispro protamine-insulin lispro (HUMALOG 75/25) (75-25) 100 UNIT/ML SUSP Inject 15 Units into the skin 2 (two) times daily.      Marland Kitchen KLOR-CON M10 10 MEQ tablet TAKE 1 TABLET EVERY OTHER DAY AS DIRECTED  30 tablet  1  . mesalamine (ASACOL) 400 MG EC tablet Take 400 mg by mouth daily. 32 TABS      . mesalamine (LIALDA) 1.2 G EC tablet Take 1,200 mg by mouth daily with breakfast. 2 TAB      . metoprolol succinate (TOPROL-XL) 100 MG 24 hr tablet TAKE 1 TABLET (100 MG TOTAL) BY MOUTH 2 (TWO) TIMES DAILY.  60 tablet  6  . Probiotic Product (PROBIOTIC DAILY PO) Take by mouth daily.      . sitaGLIPtan-metformin (JANUMET) 50-500 MG per tablet Take 1 tablet by mouth 2 (two) times daily.        . valsartan (DIOVAN) 320 MG tablet Take 320 mg by mouth daily.        . [DISCONTINUED] potassium chloride (K-DUR) 10 MEQ tablet Take one tablet by mouth every other day as directed.  30 tablet  6  No current facility-administered medications for this visit.    Allergies  Allergen Reactions  . Carvedilol   . Sudafed (Pseudoephedrine)   . Vancomycin     Review of Systems negative except from HPI and PMH  Physical Exam BP 159/70  Pulse 72  Ht 5' 6.5" (1.689 m)  Wt 130 lb 12.8 oz (59.33 kg)  BMI 20.8 kg/m2 Well developed and well nourished in no acute distress HENT normal E scleral and icterus clear Neck Supple JVP8-10 carotids brisk and full Clear to ausculation Regular rate and rhythm,  No murmurs noted\ Soft with active bowel sounds No clubbing cyanosis 1-2+ of Edema Alert and oriented, grossly normal motor and sensory function Skin Warm and Dry   Assessment and  Plan

## 2012-12-13 NOTE — Patient Instructions (Signed)
Your physician has recommended you make the following change in your medication:  1) take lasix (furosemide) 40 mg once daily alternating with 20 mg once daily.  Your physician has requested that you have an echocardiogram. Echocardiography is a painless test that uses sound waves to create images of your heart. It provides your doctor with information about the size and shape of your heart and how well your heart's chambers and valves are working. This procedure takes approximately one hour. There are no restrictions for this procedure.  Remote monitoring is used to monitor your Pacemaker of ICD from home. This monitoring reduces the number of office visits required to check your device to one time per year. It allows [place] to keep an eye on the functioning of your device to ensure it is working properly. You are scheduled for a device check from home on 01/08/13. You may send your transmission at any time that day. If you have a wireless device, the transmission will be sent automatically. After your physician reviews your transmission, you will receive a postcard with your next transmission date.   Your physician wants you to follow-up in: 6 months with Dr. Graciela Husbands. You will receive a reminder letter in the mail two months in advance. If you don't receive a letter, please call our office to schedule the follow-up appointment.

## 2012-12-13 NOTE — Assessment & Plan Note (Signed)
She has had some intercurrent supraventricular tachycardia terminated by antitachycardia pacing

## 2012-12-13 NOTE — Assessment & Plan Note (Signed)
As above.

## 2012-12-13 NOTE — Assessment & Plan Note (Signed)
ventricularTachycardia appropriately treated with antitachycardia pacing

## 2012-12-13 NOTE — Assessment & Plan Note (Signed)
We will reassess left ventricular function. Medications for blood pressure may be performed by this, i.e. The use of Aldactone. Her hemodynamic implanted monitor demonstrates increasing intrathoracic fluid so we will increase her Lasix from 20 every other day to 40/20 until she sees Dr. Timothy Lasso next week. He will be getting blood work at that time. I will defer to him the longer term diuretics but would expect that 20 mg daily hopefully will be sufficient

## 2012-12-19 ENCOUNTER — Other Ambulatory Visit (HOSPITAL_COMMUNITY): Payer: Self-pay | Admitting: Internal Medicine

## 2012-12-19 DIAGNOSIS — I509 Heart failure, unspecified: Secondary | ICD-10-CM

## 2012-12-19 DIAGNOSIS — I428 Other cardiomyopathies: Secondary | ICD-10-CM

## 2012-12-19 DIAGNOSIS — R609 Edema, unspecified: Secondary | ICD-10-CM

## 2012-12-20 ENCOUNTER — Other Ambulatory Visit: Payer: Self-pay

## 2012-12-20 ENCOUNTER — Ambulatory Visit (HOSPITAL_COMMUNITY): Payer: Medicare Other | Attending: Internal Medicine | Admitting: Radiology

## 2012-12-20 DIAGNOSIS — I509 Heart failure, unspecified: Secondary | ICD-10-CM | POA: Insufficient documentation

## 2012-12-20 DIAGNOSIS — E119 Type 2 diabetes mellitus without complications: Secondary | ICD-10-CM | POA: Insufficient documentation

## 2012-12-20 DIAGNOSIS — I428 Other cardiomyopathies: Secondary | ICD-10-CM | POA: Insufficient documentation

## 2012-12-20 DIAGNOSIS — R000 Tachycardia, unspecified: Secondary | ICD-10-CM | POA: Insufficient documentation

## 2012-12-20 DIAGNOSIS — R609 Edema, unspecified: Secondary | ICD-10-CM | POA: Insufficient documentation

## 2012-12-20 DIAGNOSIS — E785 Hyperlipidemia, unspecified: Secondary | ICD-10-CM | POA: Insufficient documentation

## 2012-12-20 DIAGNOSIS — I080 Rheumatic disorders of both mitral and aortic valves: Secondary | ICD-10-CM | POA: Insufficient documentation

## 2012-12-20 DIAGNOSIS — I10 Essential (primary) hypertension: Secondary | ICD-10-CM | POA: Insufficient documentation

## 2012-12-20 NOTE — Progress Notes (Signed)
Echocardiogram performed.  

## 2012-12-28 ENCOUNTER — Telehealth: Payer: Self-pay | Admitting: Internal Medicine

## 2012-12-28 NOTE — Telephone Encounter (Signed)
New problem   Pt had Echocardiogram 12/20/12 and need to know her results.

## 2012-12-28 NOTE — Telephone Encounter (Signed)
Spoke with pt and reviewed echo results with her.  

## 2013-01-04 ENCOUNTER — Other Ambulatory Visit: Payer: Self-pay | Admitting: Internal Medicine

## 2013-01-08 ENCOUNTER — Ambulatory Visit (INDEPENDENT_AMBULATORY_CARE_PROVIDER_SITE_OTHER): Payer: Medicare Other | Admitting: *Deleted

## 2013-01-08 ENCOUNTER — Encounter: Payer: Self-pay | Admitting: Internal Medicine

## 2013-01-08 ENCOUNTER — Other Ambulatory Visit: Payer: Self-pay

## 2013-01-08 DIAGNOSIS — I5022 Chronic systolic (congestive) heart failure: Secondary | ICD-10-CM

## 2013-01-08 DIAGNOSIS — Z95810 Presence of automatic (implantable) cardiac defibrillator: Secondary | ICD-10-CM

## 2013-01-21 LAB — REMOTE ICD DEVICE
AL IMPEDENCE ICD: 504 Ohm
BATTERY VOLTAGE: 2.66 V
BRDY-0002LV: 60 {beats}/min
BRDY-0003LV: 120 {beats}/min
BRDY-0004LV: 120 {beats}/min
FVT: 0
PACEART VT: 0
TOT-0002: 0
TZAT-0001FASTVT: 1
TZAT-0001SLOWVT: 1
TZAT-0001SLOWVT: 2
TZAT-0002ATACH: NEGATIVE
TZAT-0002ATACH: NEGATIVE
TZAT-0005SLOWVT: 88 pct
TZAT-0005SLOWVT: 91 pct
TZAT-0012ATACH: 150 ms
TZAT-0012ATACH: 150 ms
TZAT-0013SLOWVT: 2
TZAT-0013SLOWVT: 2
TZAT-0018FASTVT: NEGATIVE
TZAT-0018SLOWVT: NEGATIVE
TZAT-0018SLOWVT: NEGATIVE
TZAT-0019ATACH: 6 V
TZAT-0019FASTVT: 8 V
TZAT-0019SLOWVT: 8 V
TZAT-0020ATACH: 1.5 ms
TZAT-0020ATACH: 1.5 ms
TZAT-0020ATACH: 1.5 ms
TZAT-0020FASTVT: 1.5 ms
TZAT-0020SLOWVT: 1.5 ms
TZAT-0020SLOWVT: 1.5 ms
TZON-0003ATACH: 350 ms
TZON-0004SLOWVT: 32
TZON-0005SLOWVT: 12
TZST-0001ATACH: 4
TZST-0001FASTVT: 4
TZST-0001FASTVT: 6
TZST-0001SLOWVT: 3
TZST-0001SLOWVT: 5
TZST-0001SLOWVT: 6
TZST-0002ATACH: NEGATIVE
TZST-0003FASTVT: 25 J
TZST-0003FASTVT: 35 J
TZST-0003FASTVT: 35 J
TZST-0003SLOWVT: 35 J
TZST-0003SLOWVT: 35 J
VENTRICULAR PACING ICD: 99.92 pct

## 2013-02-01 ENCOUNTER — Encounter: Payer: Self-pay | Admitting: *Deleted

## 2013-03-12 ENCOUNTER — Encounter: Payer: Self-pay | Admitting: Internal Medicine

## 2013-03-12 ENCOUNTER — Ambulatory Visit (INDEPENDENT_AMBULATORY_CARE_PROVIDER_SITE_OTHER): Payer: Medicare Other | Admitting: *Deleted

## 2013-03-12 DIAGNOSIS — I5022 Chronic systolic (congestive) heart failure: Secondary | ICD-10-CM

## 2013-03-12 DIAGNOSIS — I428 Other cardiomyopathies: Secondary | ICD-10-CM

## 2013-03-12 DIAGNOSIS — Z95810 Presence of automatic (implantable) cardiac defibrillator: Secondary | ICD-10-CM

## 2013-03-19 LAB — REMOTE ICD DEVICE
AL AMPLITUDE: 1.3 mv
BAMS-0001: 170 {beats}/min
BATTERY VOLTAGE: 2.64 V
BRDY-0002LV: 60 {beats}/min
BRDY-0003LV: 120 {beats}/min
DEV-0020ICD: NEGATIVE
FVT: 0
LV LEAD THRESHOLD: 1 V
RV LEAD IMPEDENCE ICD: 416 Ohm
TOT-0006: 20090630000000
TZAT-0001FASTVT: 1
TZAT-0002ATACH: NEGATIVE
TZAT-0011SLOWVT: 10 ms
TZAT-0011SLOWVT: 10 ms
TZAT-0012ATACH: 150 ms
TZAT-0012ATACH: 150 ms
TZAT-0012FASTVT: 200 ms
TZAT-0012SLOWVT: 200 ms
TZAT-0012SLOWVT: 200 ms
TZAT-0013FASTVT: 1
TZAT-0013SLOWVT: 2
TZAT-0013SLOWVT: 2
TZAT-0018FASTVT: NEGATIVE
TZAT-0018SLOWVT: NEGATIVE
TZAT-0018SLOWVT: NEGATIVE
TZAT-0019ATACH: 6 V
TZAT-0019ATACH: 6 V
TZAT-0019SLOWVT: 8 V
TZAT-0019SLOWVT: 8 V
TZAT-0020ATACH: 1.5 ms
TZAT-0020ATACH: 1.5 ms
TZAT-0020FASTVT: 1.5 ms
TZAT-0020SLOWVT: 1.5 ms
TZAT-0020SLOWVT: 1.5 ms
TZON-0003ATACH: 350 ms
TZON-0003FASTVT: 240 ms
TZON-0003SLOWVT: 460 ms
TZON-0004SLOWVT: 32
TZON-0005SLOWVT: 12
TZST-0001ATACH: 4
TZST-0001ATACH: 5
TZST-0001FASTVT: 2
TZST-0001FASTVT: 4
TZST-0001FASTVT: 5
TZST-0001SLOWVT: 4
TZST-0002ATACH: NEGATIVE
TZST-0002ATACH: NEGATIVE
TZST-0003FASTVT: 35 J
TZST-0003FASTVT: 35 J
TZST-0003FASTVT: 35 J
TZST-0003SLOWVT: 15 J
TZST-0003SLOWVT: 35 J
TZST-0003SLOWVT: 35 J
VF: 0

## 2013-04-05 ENCOUNTER — Other Ambulatory Visit: Payer: Self-pay | Admitting: *Deleted

## 2013-04-05 MED ORDER — POTASSIUM CHLORIDE CRYS ER 10 MEQ PO TBCR: EXTENDED_RELEASE_TABLET | ORAL | Status: DC

## 2013-04-10 ENCOUNTER — Encounter: Payer: Self-pay | Admitting: *Deleted

## 2013-05-30 ENCOUNTER — Other Ambulatory Visit: Payer: Self-pay | Admitting: Cardiology

## 2013-06-11 ENCOUNTER — Encounter: Payer: Medicare Other | Admitting: *Deleted

## 2013-06-15 ENCOUNTER — Encounter: Payer: Self-pay | Admitting: *Deleted

## 2013-06-22 ENCOUNTER — Ambulatory Visit (INDEPENDENT_AMBULATORY_CARE_PROVIDER_SITE_OTHER): Payer: Medicare Other | Admitting: *Deleted

## 2013-06-22 DIAGNOSIS — I4729 Other ventricular tachycardia: Secondary | ICD-10-CM

## 2013-06-22 DIAGNOSIS — Z95810 Presence of automatic (implantable) cardiac defibrillator: Secondary | ICD-10-CM

## 2013-06-22 DIAGNOSIS — I472 Ventricular tachycardia: Secondary | ICD-10-CM

## 2013-06-27 LAB — REMOTE ICD DEVICE
AL AMPLITUDE: 1.2 mv
BAMS-0001: 170 {beats}/min
CHARGE TIME: 12.001 s
DEV-0020ICD: NEGATIVE
FVT: 0
LV LEAD IMPEDENCE ICD: 416 Ohm
LV LEAD THRESHOLD: 1 V
RV LEAD IMPEDENCE ICD: 400 Ohm
TOT-0001: 1
TOT-0002: 0
TOT-0006: 20090630000000
TZAT-0001ATACH: 3
TZAT-0001SLOWVT: 2
TZAT-0002ATACH: NEGATIVE
TZAT-0002ATACH: NEGATIVE
TZAT-0002ATACH: NEGATIVE
TZAT-0004FASTVT: 8
TZAT-0005FASTVT: 88 pct
TZAT-0005SLOWVT: 88 pct
TZAT-0011FASTVT: 10 ms
TZAT-0013FASTVT: 1
TZAT-0018ATACH: NEGATIVE
TZAT-0018FASTVT: NEGATIVE
TZAT-0018SLOWVT: NEGATIVE
TZAT-0018SLOWVT: NEGATIVE
TZAT-0019ATACH: 6 V
TZAT-0019ATACH: 6 V
TZAT-0019ATACH: 6 V
TZAT-0019SLOWVT: 8 V
TZAT-0019SLOWVT: 8 V
TZAT-0020SLOWVT: 1.5 ms
TZON-0003FASTVT: 240 ms
TZON-0003SLOWVT: 460 ms
TZON-0004SLOWVT: 32
TZON-0004VSLOWVT: 20
TZST-0001ATACH: 5
TZST-0001ATACH: 6
TZST-0001FASTVT: 3
TZST-0001FASTVT: 5
TZST-0001SLOWVT: 3
TZST-0002ATACH: NEGATIVE
TZST-0002ATACH: NEGATIVE
TZST-0003FASTVT: 35 J
TZST-0003FASTVT: 35 J
TZST-0003FASTVT: 35 J
TZST-0003SLOWVT: 35 J

## 2013-07-09 ENCOUNTER — Encounter: Payer: Self-pay | Admitting: Internal Medicine

## 2013-07-09 ENCOUNTER — Ambulatory Visit (INDEPENDENT_AMBULATORY_CARE_PROVIDER_SITE_OTHER): Payer: Medicare Other | Admitting: Internal Medicine

## 2013-07-09 VITALS — BP 175/79 | HR 71 | Ht 67.0 in | Wt 132.8 lb

## 2013-07-09 DIAGNOSIS — I428 Other cardiomyopathies: Secondary | ICD-10-CM

## 2013-07-09 DIAGNOSIS — I472 Ventricular tachycardia: Secondary | ICD-10-CM

## 2013-07-09 DIAGNOSIS — W19XXXA Unspecified fall, initial encounter: Secondary | ICD-10-CM

## 2013-07-09 DIAGNOSIS — Z95810 Presence of automatic (implantable) cardiac defibrillator: Secondary | ICD-10-CM

## 2013-07-09 DIAGNOSIS — I4729 Other ventricular tachycardia: Secondary | ICD-10-CM

## 2013-07-09 NOTE — Assessment & Plan Note (Signed)
No intercurrent ventricular tachycardia 

## 2013-07-09 NOTE — Assessment & Plan Note (Addendum)
The patient's device has reached ERI. We have discussed replacement of the device and its 2  components, i.e. CRT and high-voltage therapy. The former is clearly indicated. The latter we will pursue; this is her preference. This is I think what appropriate given the fact that she has had ventricular tachycardia ATP We have reviewed the benefits and risks of generator replacement.  These include but are not limited to lead fracture and infection.  The patient understands, agrees and is willing to proceed.    She also has a 6949 lead-in and high-voltage port. We discussed the potential for this fracture. Given the fact that she has a functioning pace sense lead, we'll not adding of the lead at the present time

## 2013-07-09 NOTE — Assessment & Plan Note (Signed)
Continue current medications. 

## 2013-07-09 NOTE — Patient Instructions (Addendum)
HAPPY BIRTHDAY!!!  Your physician has recommended that you have a pacemaker generator change on Aug 22, 2013.  We will send the instruction sheet to you in the mail for more information.  Your physician recommends that you continue on your current medications as directed. Please refer to the Current Medication list given to you today.

## 2013-07-09 NOTE — Progress Notes (Signed)
Patient Care Team: Gwen Pounds, MD as PCP - General (Internal Medicine)   HPI  Grace Robinson is a 79 y.o. female  is seen in followup for nonischemic cardiomyopathy complicated by congestive heart failure. She is status post CRT-D implantation was further compromised by a 6949-lead which failed prompting [place] to pirate her previously implanted pacemaker lead . This was done in 2007 there have been intercurrent normalization of LV functionwith an echo 2014 demonstrating near normal left ventricular function and mild AV valvular regurgitation  The patient denies chest pain, shortness of breath, nocturnal dyspnea, orthopnea or peripheral edema.  There have been no palpitations, lightheadedness or syncope.   She fell down yesterday and has a lot of pain in her hip     Past Medical History  Diagnosis Date  . Ischemic cardiomyopathy   . Biventricular ICD (implantable cardiac defibrillator) in place   . Diabetes mellitus   . HTN (hypertension)   . Dyslipidemia   . Ulcerative colitis   . Meniere's disease   . LBBB (left bundle branch block)     chronic  . Colon polyps   . Cellulitis     Past Surgical History  Procedure Laterality Date  . Cardiac catheterization  08/2011  . Pacemaker insertion      Medtronic, remote- no.   . Abdominal hysterectomy    . Tonsillectomy and adenoidectomy    . Ep study  08/22/08  . Abdominal hysterectomy    . Mastoid sugery      in the setting of Meniere's disease  . Pericardial effusion  12/2002    Current Outpatient Prescriptions  Medication Sig Dispense Refill  . aspirin EC 81 MG tablet Take 1 tablet (81 mg total) by mouth daily.      Marland Kitchen atorvastatin (LIPITOR) 20 MG tablet Take 20 mg by mouth daily.        . furosemide (LASIX) 20 MG tablet Take lasix (furosemide) 40 mg once daily alternating with 20 mg once daily.  45 tablet  4  . gemfibrozil (LOPID) 600 MG tablet Take 1 tablet (600 mg total) by mouth 2 (two) times daily.  60 tablet  5  .  losartan (COZAAR) 100 MG tablet Take 100 mg by mouth daily.      . potassium chloride (KLOR-CON M10) 10 MEQ tablet TAKE 1 TABLET EVERY OTHER DAY AS DIRECTED  30 tablet  1  . [DISCONTINUED] potassium chloride (K-DUR) 10 MEQ tablet Take one tablet by mouth every other day as directed.  30 tablet  6   No current facility-administered medications for this visit.    Allergies  Allergen Reactions  . Carvedilol   . Sudafed [Pseudoephedrine]   . Vancomycin     Review of Systems negative except from HPI and PMH  Physical Exam BP 175/79  Pulse 71  Ht 5\' 7"  (1.702 m)  Wt 132 lb 12.8 oz (60.238 kg)  BMI 20.79 kg/m2 Well developed and well nourished in no acute distress HENT normal E scleral and icterus clear Neck Supple JVP flat; carotids brisk and full Clear to ausculation The patient's device was interrogated.  The information was reviewed. No changes were made in the programming there is superficial atrophy.   Regular rate and rhythm, no murmurs gallops or rub Soft with active bowel sounds No clubbing cyanosis none Edema Alert and oriented, grossly normal motor and sensory function Skin Warm and Dry Walking with a limp  ECG demonstrates P. synchronous pacing with a left bundle branch  morphology  Assessment and  Plan

## 2013-07-10 DIAGNOSIS — W19XXXA Unspecified fall, initial encounter: Secondary | ICD-10-CM | POA: Insufficient documentation

## 2013-07-10 NOTE — Assessment & Plan Note (Signed)
Have asked her to followup with PCP if not better within the next few days

## 2013-07-13 ENCOUNTER — Other Ambulatory Visit: Payer: Self-pay | Admitting: *Deleted

## 2013-07-13 ENCOUNTER — Encounter: Payer: Self-pay | Admitting: *Deleted

## 2013-07-13 DIAGNOSIS — I428 Other cardiomyopathies: Secondary | ICD-10-CM

## 2013-07-30 ENCOUNTER — Telehealth: Payer: Self-pay | Admitting: Internal Medicine

## 2013-07-30 NOTE — Telephone Encounter (Signed)
New Problem  Will have surgery on October 22nd // was put on a new medication ( invokana 100 mg every other day) wanted to be sure that this medication was ok to take. Please advise

## 2013-07-30 NOTE — Telephone Encounter (Signed)
Pt informed this new med ok with other meds per Orma Flaming D. Pt told that she will also hold it the morning of her ICD battery change, Pt verbalized understanding.

## 2013-08-01 ENCOUNTER — Encounter: Payer: Self-pay | Admitting: Internal Medicine

## 2013-08-03 ENCOUNTER — Ambulatory Visit (INDEPENDENT_AMBULATORY_CARE_PROVIDER_SITE_OTHER): Payer: Medicare Other | Admitting: Cardiology

## 2013-08-03 ENCOUNTER — Encounter: Payer: Self-pay | Admitting: Cardiology

## 2013-08-03 VITALS — BP 140/80 | HR 74 | Ht 66.0 in | Wt 129.0 lb

## 2013-08-03 DIAGNOSIS — I471 Supraventricular tachycardia: Secondary | ICD-10-CM

## 2013-08-03 DIAGNOSIS — I472 Ventricular tachycardia: Secondary | ICD-10-CM

## 2013-08-03 DIAGNOSIS — I10 Essential (primary) hypertension: Secondary | ICD-10-CM

## 2013-08-03 DIAGNOSIS — I498 Other specified cardiac arrhythmias: Secondary | ICD-10-CM

## 2013-08-03 DIAGNOSIS — I4729 Other ventricular tachycardia: Secondary | ICD-10-CM

## 2013-08-03 DIAGNOSIS — I428 Other cardiomyopathies: Secondary | ICD-10-CM

## 2013-08-03 NOTE — Progress Notes (Signed)
HPI The patient presents for followup of her nonischemic cardiomyopathy. Since I last saw her she has done well.  She still has balance issues.  The patient denies any new symptoms such as chest discomfort, neck or arm discomfort. There has been no new shortness of breath, PND or orthopnea. There have been no reported palpitations, presyncope or syncope.  She is due to have her device replaced as it is ERI.  Dr. Graciela Husbands is planning to do this later this month.   Of note she does do her light housekeeping.  Allergies  Allergen Reactions  . Carvedilol   . Sudafed [Pseudoephedrine]   . Vancomycin     Current Outpatient Prescriptions  Medication Sig Dispense Refill  . aspirin EC 81 MG tablet Take 1 tablet (81 mg total) by mouth daily.      Marland Kitchen atorvastatin (LIPITOR) 20 MG tablet Take 20 mg by mouth daily.        . Canagliflozin (INVOKANA) 100 MG TABS Take by mouth.      . furosemide (LASIX) 20 MG tablet Take lasix (furosemide) 40 mg once daily alternating with 20 mg once daily.  45 tablet  4  . gemfibrozil (LOPID) 600 MG tablet Take 1 tablet (600 mg total) by mouth 2 (two) times daily.  60 tablet  5  . JANUVIA 100 MG tablet Take 1 tablet by mouth daily.      Marland Kitchen losartan (COZAAR) 100 MG tablet Take 100 mg by mouth daily.      . metoprolol succinate (TOPROL-XL) 100 MG 24 hr tablet Take 1 tablet by mouth 2 (two) times daily.      . potassium chloride (KLOR-CON M10) 10 MEQ tablet TAKE 1 TABLET EVERY OTHER DAY AS DIRECTED  30 tablet  1  . [DISCONTINUED] potassium chloride (K-DUR) 10 MEQ tablet Take one tablet by mouth every other day as directed.  30 tablet  6   No current facility-administered medications for this visit.    Past Medical History  Diagnosis Date  . Ischemic cardiomyopathy   . Biventricular ICD (implantable cardiac defibrillator) in place   . Diabetes mellitus   . HTN (hypertension)   . Dyslipidemia   . Ulcerative colitis   . Meniere's disease   . LBBB (left bundle branch  block)     chronic  . Colon polyps   . Cellulitis     Past Surgical History  Procedure Laterality Date  . Cardiac catheterization  08/2011  . Pacemaker insertion      Medtronic, remote- no.   . Abdominal hysterectomy    . Tonsillectomy and adenoidectomy    . Ep study  08/22/08  . Abdominal hysterectomy    . Mastoid sugery      in the setting of Meniere's disease  . Pericardial effusion  12/2002    ROS:  As stated in the HPI and negative for all other systems.  PHYSICAL EXAM BP 140/80  Pulse 74  Ht 5\' 6"  (1.676 m)  Wt 129 lb (58.514 kg)  BMI 20.83 kg/m2 GENERAL:  Well appearing NECK:  No jugular venous distention, waveform within normal limits, carotid upstroke brisk and symmetric, no bruits, no thyromegaly LUNGS:  Clear to auscultation bilaterally BACK:  No CVA tenderness CHEST:  ICD HEART:  PMI not displaced or sustained,S1 and S2 within normal limits, no S3, no S4, no clicks, no rubs, no murmurs ABD:  Flat, positive bowel sounds normal in frequency in pitch, no bruits, no rebound, no guarding, no midline  pulsatile mass, no hepatomegaly, no splenomegaly EXT:  2 plus pulses throughout, no edema, no cyanosis no clubbing  ASSESSMENT AND PLAN  CARDIOMYOPATHY, PRIMARY, DILATED -  Her EF was 55% in February. She has no symptoms. No change in therapy is indicated.  She will remain on the current dose of Lasix.  HTN (hypertension) -  Her blood pressure is well controlled.   No change in therapy is indicated.

## 2013-08-03 NOTE — Patient Instructions (Addendum)
The current medical regimen is effective;  continue present plan and medications.  Follow up in April 2015 with Norma Fredrickson, NP.

## 2013-08-13 ENCOUNTER — Encounter (INDEPENDENT_AMBULATORY_CARE_PROVIDER_SITE_OTHER): Payer: Self-pay

## 2013-08-13 ENCOUNTER — Other Ambulatory Visit (INDEPENDENT_AMBULATORY_CARE_PROVIDER_SITE_OTHER): Payer: Medicare Other

## 2013-08-13 DIAGNOSIS — I428 Other cardiomyopathies: Secondary | ICD-10-CM

## 2013-08-13 LAB — CBC WITH DIFFERENTIAL/PLATELET
Basophils Relative: 0.4 % (ref 0.0–3.0)
Eosinophils Relative: 3.2 % (ref 0.0–5.0)
HCT: 36.8 % (ref 36.0–46.0)
Hemoglobin: 12.5 g/dL (ref 12.0–15.0)
Lymphs Abs: 0.8 10*3/uL (ref 0.7–4.0)
MCV: 91.5 fl (ref 78.0–100.0)
Monocytes Absolute: 0.4 10*3/uL (ref 0.1–1.0)
Monocytes Relative: 9 % (ref 3.0–12.0)
Neutro Abs: 2.9 10*3/uL (ref 1.4–7.7)
Platelets: 150 10*3/uL (ref 150.0–400.0)
RDW: 14.1 % (ref 11.5–14.6)
WBC: 4.3 10*3/uL — ABNORMAL LOW (ref 4.5–10.5)

## 2013-08-13 LAB — BASIC METABOLIC PANEL
BUN: 20 mg/dL (ref 6–23)
CO2: 24 mEq/L (ref 19–32)
Chloride: 105 mEq/L (ref 96–112)
GFR: 63.23 mL/min (ref >60.00)
Glucose, Bld: 249 mg/dL — ABNORMAL HIGH (ref 70–99)
Potassium: 3.9 mEq/L (ref 3.5–5.1)
Sodium: 141 mEq/L (ref 135–145)

## 2013-08-15 ENCOUNTER — Telehealth: Payer: Self-pay | Admitting: Internal Medicine

## 2013-08-15 NOTE — Telephone Encounter (Signed)
Message copied by Burnell Blanks on Wed Aug 15, 2013  9:59 AM ------      Message from: Duke Salvia      Created: Tue Aug 14, 2013  2:31 PM       Please Inform Patient that mild abnormality is stable      Thanks       ------

## 2013-08-15 NOTE — Telephone Encounter (Signed)
Advised patient of lab results  

## 2013-08-15 NOTE — Telephone Encounter (Signed)
New message    Having surgery wed---someone from [facility] called her and told her to call Dr Odessa Fleming office and speak to a triage nurse. Please call before 10 or after 12:00.

## 2013-08-21 ENCOUNTER — Encounter (HOSPITAL_COMMUNITY): Payer: Self-pay | Admitting: Pharmacy Technician

## 2013-08-21 MED ORDER — CEFAZOLIN SODIUM-DEXTROSE 2-3 GM-% IV SOLR
2.0000 g | INTRAVENOUS | Status: DC
  Filled 2013-08-21: qty 50

## 2013-08-21 MED ORDER — SODIUM CHLORIDE 0.9 % IR SOLN
80.0000 mg | Status: DC
  Filled 2013-08-21: qty 2

## 2013-08-22 ENCOUNTER — Encounter (HOSPITAL_COMMUNITY)
Admission: RE | Disposition: A | Discharge status: Home / Self Care | Payer: Self-pay | Source: Ambulatory Visit | Attending: Internal Medicine

## 2013-08-22 ENCOUNTER — Ambulatory Visit (HOSPITAL_COMMUNITY)
Admission: RE | Admit: 2013-08-22 | Discharge: 2013-08-22 | Disposition: A | Discharge status: Home / Self Care | Payer: Medicare Other | Source: Ambulatory Visit | Attending: Internal Medicine | Admitting: Internal Medicine

## 2013-08-22 DIAGNOSIS — E785 Hyperlipidemia, unspecified: Secondary | ICD-10-CM | POA: Insufficient documentation

## 2013-08-22 DIAGNOSIS — K5190 Ulcerative colitis, unspecified, without complications: Secondary | ICD-10-CM | POA: Insufficient documentation

## 2013-08-22 DIAGNOSIS — Z860100 Personal history of colon polyps, unspecified: Secondary | ICD-10-CM | POA: Insufficient documentation

## 2013-08-22 DIAGNOSIS — T82198A Other mechanical complication of other cardiac electronic device, initial encounter: Secondary | ICD-10-CM

## 2013-08-22 DIAGNOSIS — I2589 Other forms of chronic ischemic heart disease: Secondary | ICD-10-CM | POA: Insufficient documentation

## 2013-08-22 DIAGNOSIS — Z4502 Encounter for adjustment and management of automatic implantable cardiac defibrillator: Secondary | ICD-10-CM | POA: Insufficient documentation

## 2013-08-22 DIAGNOSIS — I509 Heart failure, unspecified: Secondary | ICD-10-CM | POA: Insufficient documentation

## 2013-08-22 DIAGNOSIS — E119 Type 2 diabetes mellitus without complications: Secondary | ICD-10-CM | POA: Insufficient documentation

## 2013-08-22 DIAGNOSIS — Z01810 Encounter for preprocedural cardiovascular examination: Secondary | ICD-10-CM | POA: Insufficient documentation

## 2013-08-22 DIAGNOSIS — I428 Other cardiomyopathies: Secondary | ICD-10-CM

## 2013-08-22 DIAGNOSIS — I10 Essential (primary) hypertension: Secondary | ICD-10-CM | POA: Insufficient documentation

## 2013-08-22 DIAGNOSIS — Z79899 Other long term (current) drug therapy: Secondary | ICD-10-CM | POA: Insufficient documentation

## 2013-08-22 HISTORY — PX: IMPLANTABLE CARDIOVERTER DEFIBRILLATOR GENERATOR CHANGE: SHX5474

## 2013-08-22 LAB — GLUCOSE, CAPILLARY
Glucose-Capillary: 212 mg/dL — ABNORMAL HIGH (ref 70–99)
Glucose-Capillary: 183 mg/dL — ABNORMAL HIGH (ref 70–99)

## 2013-08-22 LAB — SURGICAL PCR SCREEN
MRSA, PCR: NEGATIVE
Staphylococcus aureus: NEGATIVE

## 2013-08-22 SURGERY — IMPLANTABLE CARDIOVERTER DEFIBRILLATOR GENERATOR CHANGE
Anesthesia: LOCAL

## 2013-08-22 MED ORDER — FENTANYL CITRATE 0.05 MG/ML IJ SOLN
INTRAMUSCULAR | Status: AC
  Filled 2013-08-22: qty 2

## 2013-08-22 MED ORDER — SODIUM CHLORIDE 0.9 % IV SOLN: INTRAVENOUS | Status: AC

## 2013-08-22 MED ORDER — LIDOCAINE HCL (PF) 1 % IJ SOLN
INTRAMUSCULAR | Status: AC
  Filled 2013-08-22: qty 60

## 2013-08-22 MED ORDER — MUPIROCIN 2 % EX OINT
TOPICAL_OINTMENT | CUTANEOUS | Status: AC
  Filled 2013-08-22: qty 22

## 2013-08-22 MED ORDER — CHLORHEXIDINE GLUCONATE 4 % EX LIQD: 60.0000 mL | Freq: Once | CUTANEOUS | Status: DC

## 2013-08-22 MED ORDER — MUPIROCIN 2 % EX OINT
TOPICAL_OINTMENT | Freq: Two times a day (BID) | CUTANEOUS | Status: DC
  Administered 2013-08-22: 1 via NASAL
  Filled 2013-08-22: qty 22

## 2013-08-22 MED ORDER — MIDAZOLAM HCL 5 MG/5ML IJ SOLN
INTRAMUSCULAR | Status: AC
  Filled 2013-08-22: qty 5

## 2013-08-22 MED ORDER — SODIUM CHLORIDE 0.9 % IV SOLN
INTRAVENOUS | Status: DC
  Administered 2013-08-22: 07:00:00 via INTRAVENOUS

## 2013-08-22 MED ORDER — ONDANSETRON HCL 4 MG/2ML IJ SOLN: 4.0000 mg | Freq: Four times a day (QID) | INTRAMUSCULAR | Status: DC | PRN

## 2013-08-22 MED ORDER — ACETAMINOPHEN 325 MG PO TABS
325.0000 mg | ORAL_TABLET | ORAL | Status: DC | PRN
  Filled 2013-08-22: qty 2

## 2013-08-22 NOTE — CV Procedure (Signed)
Preoperative diagnosis  NICM VT previous ICD-CRT now at [facility], prior failed 6949 R/S leas Postoperative diagnosis same/ insulation violation of LV lead  Procedure: Generator replacement  Lead repair  Following informed consent the patient was brought to the electrophysiology laboratory in place of the fluoroscopic table in the supine position after routine prep and drape lidocaine was infiltrated in the region of the previous incision and carried down to later the device pocket using sharp dissection and electrocautery. The pocket was opened the device was freed up and was explanted.  Interrogation of the previously implanted ICD ventricular lead guidant RV   demonstrated an R wave of >30  millivolts., and impedance of 510 ohms, and a pacing threshold of 0.8 volts at 0.6 msec.     Interrogation of the previously implanted Left ventricular lead Medtronic 4193  demonstrated an R wave of n/a  millivolts., and impedance of 516 ohms, and a pacing threshold of 0.5 volts at 0.5 msec.    The previously implanted atrial lead .bsx 4469 demonstrated a P-wave amplitude of 1.8 milllivolts  and impedance of   511ohms, and a pacing threshold of   volts at 0.5@ 0.65milliseconds.  The leads were inspected. Repair was needed. The leads were then attached to a Medtronic  pulse generator, serial number [id number].    Through the device the P-wave amplitude  Was  1.6 milllivolts and impedance of  456 ohms, and a pacing threshold of 0.75 volts at @ 0.4 milliseconds; the ICD ventricular lead demonstrated an R wave of >20  millivolts., and impedance of 456 ohms, and a pacing threshold of 0.5 volts at 0.6 msec and the  Left ventricular lead demonstrated an  impedance of 456 ohms, and a pacing threshold of 0.5 volts at 0.4 msec.    High voltage impedances were 65/23 ohms  The pocket was irrigated with antibiotic containing saline solution hemostasis was assured and the leads and the device were placed in the pocket. The  wound was then closed in2  layers in normal fashion. Dermabond was placed  The patient tolerated the procedure without apparent complication.  DFT testing was not  performed  Sherryl Manges   \

## 2013-08-22 NOTE — Interval H&P Note (Signed)
ICD Criteria  Current LVEF (within 6 months):50%  NYHA Functional Classification: Class II  Heart Failure History:  Yes, Duration of heart failure since onset is > 9 months  Non-Ischemic Dilated Cardiomyopathy History:  Yes, timeframe is > 9 months  Atrial Fibrillation/Atrial Flutter:  No.  Ventricular Tachycardia History:  No.  Cardiac Arrest History:  No  History of Syndromes with Risk of Sudden Death:  No.  Previous ICD:  Yes, ICD Type:  CRT-D, Reason for ICD:  Primary prevention.  20%  Electrophysiology Study: No.  Anticoagulation Therapy:  Patient is NOT on anticoagulation therapy.   Beta Blocker Therapy:  No, Reason not on Beta Blocker therapy: intolerant  Ace Inhibitor/ARB Therapy:  Yes. History and Physical Interval Note:  08/22/2013 7:30 AM  Grace Robinson  has presented today for surgery, with the diagnosis of cm  The various methods of treatment have been discussed with the patient and family. After consideration of risks, benefits and other options for treatment, the patient has consented to  Procedure(s): IMPLANTABLE CARDIOVERTER DEFIBRILLATOR GENERATOR CHANGE (N/A) as a surgical intervention .  The patient's history has been reviewed, patient examined, no change in status, stable for surgery.  I have reviewed the patient's chart and labs.  Questions were answered to the patient's satisfaction.     Sherryl Manges

## 2013-08-22 NOTE — H&P (Signed)
Patient Care Team: Gwen Pounds, MD as PCP - General (Internal Medicine)   HPI  Grace Robinson is a 79 y.o. female Admitted for CRTD generator replacement  She has  nonischemic cardiomyopathy complicated by congestive heart failure. She is status post CRT-D implantation was further compromised by a 6949-lead which failed prompting [place] to pirate her previously implanted pacemaker lead . This was done in 2007 there have been intercurrent normalization of LV function  with an echo 2014 demonstrating near normal left ventricular function and mild AV valvular regurgitation   The patient denies chest pain, shortness of breath, nocturnal dyspnea, orthopnea or peripheral edema. There have been no palpitations, lightheadedness or syncope.       Past Medical History  Diagnosis Date  . Ischemic cardiomyopathy   . Biventricular ICD (implantable cardiac defibrillator) in place   . Diabetes mellitus   . HTN (hypertension)   . Dyslipidemia   . Ulcerative colitis   . Meniere's disease   . LBBB (left bundle branch block)     chronic  . Colon polyps   . Cellulitis     Past Surgical History  Procedure Laterality Date  . Cardiac catheterization  08/2011  . Pacemaker insertion      Medtronic, remote- no.   . Abdominal hysterectomy    . Tonsillectomy and adenoidectomy    . Ep study  08/22/08  . Abdominal hysterectomy    . Mastoid sugery      in the setting of Meniere's disease  . Pericardial effusion  12/2002    Current Facility-Administered Medications  Medication Dose Route Frequency Provider Last Rate Last Dose  . 0.9 %  sodium chloride infusion   Intravenous Continuous Duke Salvia, MD 50 mL/hr at 08/22/13 [phone]    . ceFAZolin (ANCEF) IVPB 2 g/50 mL premix  2 g Intravenous On Call Duke Salvia, MD      . chlorhexidine (HIBICLENS) 4 % liquid 4 application  60 mL Topical Once Duke Salvia, MD      . gentamicin (GARAMYCIN) 80 mg in sodium chloride irrigation 0.9 % 500 mL  irrigation  80 mg Irrigation On Call Duke Salvia, MD      . mupirocin ointment (BACTROBAN) 2 %   Nasal BID Duke Salvia, MD   1 application at 08/22/13 9604    Allergies  Allergen Reactions  . Carvedilol   . Sudafed [Pseudoephedrine]     Makes skin feel like its crawling  . Vancomycin     Review of Systems negative except from HPI and PMH  Physical Exam BP 174/75  Pulse 70  Temp(Src) 97.8 F (36.6 C) (Oral)  Resp 18  Ht 5' 6.5" (1.689 m)  Wt 130 lb (58.968 kg)  BMI 20.67 kg/m2  SpO2 100% Well developed and well nourished in no acute distress HENT normal E scleral and icterus clear Neck Supple JVP flat; carotids brisk and full Clear to ausculation Device pocket well healed; without hematoma or erythema.  There is no tethering Regular rate and rhythm, no murmurs gallops or rub Soft with active bowel sounds No clubbing cyanosis none Edema Alert and oriented, grossly normal motor and sensory function Skin Warm and Dry   Assessment and  Plan CRT-D at ERI  For replacement NICM-resolved  AUc appropriate to replace We have reviewed the benefits and risks of generator replacement.  These include but are not limited to lead fracture and infection.  The patient understands, agrees  and is willing to proceed.

## 2013-09-03 ENCOUNTER — Ambulatory Visit: Payer: Medicare Other

## 2013-09-04 ENCOUNTER — Ambulatory Visit (INDEPENDENT_AMBULATORY_CARE_PROVIDER_SITE_OTHER): Payer: Medicare Other | Admitting: *Deleted

## 2013-09-04 DIAGNOSIS — I472 Ventricular tachycardia: Secondary | ICD-10-CM

## 2013-09-04 DIAGNOSIS — I4729 Other ventricular tachycardia: Secondary | ICD-10-CM

## 2013-09-04 DIAGNOSIS — I428 Other cardiomyopathies: Secondary | ICD-10-CM

## 2013-09-04 LAB — ICD DEVICE OBSERVATION
AL IMPEDENCE ICD: 475 Ohm
LV LEAD IMPEDENCE ICD: 475 Ohm
LV LEAD THRESHOLD: 0.75 V
RV LEAD AMPLITUDE: 20 mv
TZON-0003SLOWVT: 461.5 ms

## 2013-09-04 NOTE — Progress Notes (Signed)
Wound check appointment. Steri-strips removed. Wound without redness or edema. Incision edges approximated, wound well healed. Normal device function. Thresholds, sensing, and impedances consistent with implant measurements. Device programmed at 3.5V for extra safety margin until 3 month visit. Histogram distribution appropriate for patient and level of activity. No mode switches or ventricular arrhythmias noted. Patient educated about wound care, arm mobility, lifting restrictions, shock plan. [facility] 12/06/13.

## 2013-10-04 ENCOUNTER — Other Ambulatory Visit: Payer: Self-pay | Admitting: Internal Medicine

## 2013-10-11 ENCOUNTER — Encounter: Payer: Self-pay | Admitting: Cardiology

## 2013-12-05 ENCOUNTER — Encounter: Payer: Self-pay | Admitting: Internal Medicine

## 2013-12-06 ENCOUNTER — Ambulatory Visit (INDEPENDENT_AMBULATORY_CARE_PROVIDER_SITE_OTHER): Payer: Medicare Other | Admitting: *Deleted

## 2013-12-06 DIAGNOSIS — I428 Other cardiomyopathies: Secondary | ICD-10-CM

## 2013-12-06 LAB — MDC_IDC_ENUM_SESS_TYPE_REMOTE
Lead Channel Impedance Value: 456 Ohm
Lead Channel Impedance Value: 475 Ohm
Lead Channel Pacing Threshold Amplitude: 0.75 V
Lead Channel Pacing Threshold Pulse Width: 0.4 ms
Lead Channel Sensing Intrinsic Amplitude: 20 mV
Lead Channel Setting Pacing Amplitude: 2 V
Lead Channel Setting Pacing Amplitude: 2.5 V
Lead Channel Setting Pacing Pulse Width: 0.4 ms
Lead Channel Setting Sensing Sensitivity: 0.3 mV
MDC IDC MSMT LEADCHNL LV PACING THRESHOLD AMPLITUDE: 0.75 V
MDC IDC MSMT LEADCHNL LV PACING THRESHOLD PULSEWIDTH: 0.4 ms
MDC IDC MSMT LEADCHNL RA IMPEDANCE VALUE: 456 Ohm
MDC IDC MSMT LEADCHNL RA IMPEDANCE VALUE: 475 Ohm
MDC IDC MSMT LEADCHNL RA SENSING INTR AMPL: 1.3 mV
MDC IDC SET LEADCHNL LV PACING AMPLITUDE: 2.25 V
MDC IDC SET LEADCHNL LV PACING PULSEWIDTH: 0.4 ms
MDC IDC SET ZONE DETECTION INTERVAL: 300 ms
MDC IDC SET ZONE DETECTION INTERVAL: 350 ms
MDC IDC STAT BRADY AP VP PERCENT: 25.8 %
MDC IDC STAT BRADY AP VS PERCENT: 0.3 %
MDC IDC STAT BRADY AS VP PERCENT: 72.8 %
MDC IDC STAT BRADY AS VS PERCENT: 1.1 %
Zone Setting Detection Interval: 240 ms
Zone Setting Detection Interval: 450 ms
Zone Setting Detection Interval: 460 ms

## 2013-12-20 ENCOUNTER — Encounter: Payer: Medicare Other | Admitting: Internal Medicine

## 2013-12-26 ENCOUNTER — Encounter: Payer: Self-pay | Admitting: *Deleted

## 2014-01-10 ENCOUNTER — Encounter: Payer: Self-pay | Admitting: Internal Medicine

## 2014-01-17 ENCOUNTER — Encounter: Payer: Self-pay | Admitting: Internal Medicine

## 2014-01-17 ENCOUNTER — Ambulatory Visit (INDEPENDENT_AMBULATORY_CARE_PROVIDER_SITE_OTHER): Payer: Medicare Other | Admitting: Internal Medicine

## 2014-01-17 VITALS — BP 166/86 | HR 70 | Ht 66.0 in | Wt 132.0 lb

## 2014-01-17 DIAGNOSIS — I428 Other cardiomyopathies: Secondary | ICD-10-CM

## 2014-01-17 DIAGNOSIS — T82198A Other mechanical complication of other cardiac electronic device, initial encounter: Secondary | ICD-10-CM

## 2014-01-17 DIAGNOSIS — I472 Ventricular tachycardia, unspecified: Secondary | ICD-10-CM

## 2014-01-17 DIAGNOSIS — I4729 Other ventricular tachycardia: Secondary | ICD-10-CM

## 2014-01-17 DIAGNOSIS — Z95810 Presence of automatic (implantable) cardiac defibrillator: Secondary | ICD-10-CM

## 2014-01-17 HISTORY — DX: Other mechanical complication of other cardiac electronic device, initial encounter: T82.198A

## 2014-01-17 LAB — MDC_IDC_ENUM_SESS_TYPE_INCLINIC
Battery Remaining Longevity: 105 mo
Battery Voltage: 3.03 V
Brady Statistic AP VS Percent: 0.34 %
Brady Statistic RA Percent Paced: 26.33 %
Brady Statistic RV Percent Paced: 2.67 %
Date Time Interrogation Session: 20150319125343
HIGH POWER IMPEDANCE MEASURED VALUE: 78 Ohm
HighPow Impedance: 171 Ohm
HighPow Impedance: 53 Ohm
Lead Channel Impedance Value: 4047 Ohm
Lead Channel Impedance Value: 475 Ohm
Lead Channel Impedance Value: 475 Ohm
Lead Channel Impedance Value: 475 Ohm
Lead Channel Pacing Threshold Amplitude: 0.625 V
Lead Channel Pacing Threshold Pulse Width: 0.4 ms
Lead Channel Sensing Intrinsic Amplitude: 27.125 mV
Lead Channel Sensing Intrinsic Amplitude: 27.125 mV
Lead Channel Setting Pacing Amplitude: 2.5 V
Lead Channel Setting Sensing Sensitivity: 0.6 mV
MDC IDC MSMT LEADCHNL LV IMPEDANCE VALUE: 4047 Ohm
MDC IDC MSMT LEADCHNL LV PACING THRESHOLD AMPLITUDE: 0.75 V
MDC IDC MSMT LEADCHNL LV PACING THRESHOLD PULSEWIDTH: 0.4 ms
MDC IDC MSMT LEADCHNL RA PACING THRESHOLD PULSEWIDTH: 0.4 ms
MDC IDC MSMT LEADCHNL RA SENSING INTR AMPL: 1.375 mV
MDC IDC MSMT LEADCHNL RA SENSING INTR AMPL: 1.375 mV
MDC IDC MSMT LEADCHNL RV PACING THRESHOLD AMPLITUDE: 1.125 V
MDC IDC SET LEADCHNL LV PACING AMPLITUDE: 2.25 V
MDC IDC SET LEADCHNL LV PACING PULSEWIDTH: 0.4 ms
MDC IDC SET LEADCHNL RA PACING AMPLITUDE: 2 V
MDC IDC SET LEADCHNL RV PACING PULSEWIDTH: 0.4 ms
MDC IDC SET ZONE DETECTION INTERVAL: 300 ms
MDC IDC SET ZONE DETECTION INTERVAL: 460 ms
MDC IDC STAT BRADY AP VP PERCENT: 25.99 %
MDC IDC STAT BRADY AS VP PERCENT: 72.64 %
MDC IDC STAT BRADY AS VS PERCENT: 1.02 %
Zone Setting Detection Interval: 240 ms
Zone Setting Detection Interval: 350 ms
Zone Setting Detection Interval: 450 ms

## 2014-01-17 MED ORDER — FUROSEMIDE 20 MG PO TABS
ORAL_TABLET | ORAL | Status: DC
Start: 2014-01-17 — End: 2014-01-17

## 2014-01-17 MED ORDER — FUROSEMIDE 20 MG PO TABS: ORAL_TABLET | ORAL | Status: DC

## 2014-01-17 NOTE — Progress Notes (Signed)
Patient Care Team: Precious Reel, MD as PCP - General (Internal Medicine)   HPI  Grace Robinson is a 80 y.o. female is seen in followup for nonischemic cardiomyopathy complicated by congestive heart failure. She is status post CRT-D implantation was further compromised by a 6949-lead which failed prompting [place] to pirate her previously implanted pacemaker lead . This was done in 2007   There have been intercurrent normalization of LV functionwith an echo 2014 demonstrating near normal left ventricular function and mild AV valvular regurgitation   She underwent generator replacement 0ct 2014; she's had some tingling over the incision which is somewhat better but still persists. There has been no erythema.  The patient denies chest pain, There have been no palpitations, lightheadedness or syncope.  She has a cold recently with some edema as well as some dyspnea. She is trying to modulate her diuretic without getting impact.     Past Medical History  Diagnosis Date  . Ischemic cardiomyopathy   . Biventricular ICD (implantable cardiac defibrillator) in place   . Diabetes mellitus   . HTN (hypertension)   . Dyslipidemia   . Ulcerative colitis   . Meniere's disease   . LBBB (left bundle branch block)     chronic  . Colon polyps   . Cellulitis     Past Surgical History  Procedure Laterality Date  . Cardiac catheterization  08/2011  . Pacemaker insertion      Medtronic, remote- no.   . Abdominal hysterectomy    . Tonsillectomy and adenoidectomy    . Ep study  08/22/08  . Abdominal hysterectomy    . Mastoid sugery      in the setting of Meniere's disease  . Pericardial effusion  12/2002    Current Outpatient Prescriptions  Medication Sig Dispense Refill  . aspirin EC 81 MG tablet Take 1 tablet (81 mg total) by mouth daily.      Marland Kitchen atorvastatin (LIPITOR) 20 MG tablet Take 20 mg by mouth daily.        . furosemide (LASIX) 20 MG tablet Take 20 mg by mouth daily.       Marland Kitchen  gemfibrozil (LOPID) 600 MG tablet Take 600 mg by mouth 2 (two) times daily before a meal.      . insulin lispro (HUMALOG) 100 UNIT/ML injection Inject 10 Units into the skin 2 (two) times daily.      Marland Kitchen losartan (COZAAR) 100 MG tablet Take 100 mg by mouth daily.      . metoprolol succinate (TOPROL-XL) 100 MG 24 hr tablet Take 1 tablet by mouth 2 (two) times daily.      . naproxen sodium (ANAPROX) 220 MG tablet Take 220 mg by mouth 2 (two) times daily as needed (for knee pain).      . potassium chloride (K-DUR) 10 MEQ tablet Take 10 mEq by mouth daily.       . sitaGLIPtin (JANUVIA) 100 MG tablet Take 100 mg by mouth daily.      . vitamin C (ASCORBIC ACID) 500 MG tablet Take 1,000 mg by mouth daily.       No current facility-administered medications for this visit.    Allergies  Allergen Reactions  . Carvedilol   . Sudafed [Pseudoephedrine]     Makes skin feel like its crawling  . Vancomycin     Review of Systems negative except from HPI and PMH  Physical Exam BP 166/86  Pulse 70  Ht 5'  6" (1.676 m)  Wt 132 lb (59.875 kg)  BMI 21.32 kg/m2 Well developed and well nourished in no acute distress HENT normal E scleral and icterus clear Neck Supple JVP flat; carotids brisk and full Clear to ausculation Device pocket well healed; without hematoma or erythema.  There is no tethering  Regular rate and rhythm, no murmurs gallops or rub Soft with active bowel sounds No clubbing cyanosis Trace Edema Alert and oriented, grossly normal motor and sensory function Skin Warm and Dry    Assessment and  Plan  HFpEF  CRT-D  The patient's device was interrogated and the information was fully reviewed.  The device was reprogrammed to ventricular sensitivity  TWave Oversensing   HTN  Ischemic cardiomyopathy  Ventricular Tachycardia  No intercurrent Ventricular tachycardia   We will increase her diuretics to 20 alternating 40. She is to see could see in 2 weeks and we will assess her  metabolic profile at bedtime and see what we had made sufficient tach on her fluid status. Her optimal index is elevated today although not strikingly.  With the T-wave oversensing we have reprogrammed ventricular sensitivity 0.3--0.6 mV contacted Medtronic to see if there are alternative.  Hypertension is not controlled we'll check it again in a couple of weeks with the higher dose diuretics

## 2014-01-17 NOTE — Patient Instructions (Addendum)
Your physician has recommended you make the following change in your medication:  1) Change Lasix to alternate dosages - take 20 mg one day, take 40 mg next day, then keep repeating this patten.   Remote monitoring is used to monitor your Pacemaker of ICD from home. This monitoring reduces the number of office visits required to check your device to one time per year. It allows [place] to keep an eye on the functioning of your device to ensure it is working properly. You are scheduled for a device check from home on 04/22/14. You may send your transmission at any time that day. If you have a wireless device, the transmission will be sent automatically. After your physician reviews your transmission, you will receive a postcard with your next transmission date.  Your physician wants you to follow-up in: 6 months with Dr. Caryl Comes.  You will receive a reminder letter in the mail two months in advance. If you don't receive a letter, please call our office to schedule the follow-up appointment.

## 2014-01-24 ENCOUNTER — Encounter: Payer: Self-pay | Admitting: Internal Medicine

## 2014-01-30 ENCOUNTER — Ambulatory Visit (INDEPENDENT_AMBULATORY_CARE_PROVIDER_SITE_OTHER): Payer: Medicare Other | Admitting: Nurse Practitioner

## 2014-01-30 ENCOUNTER — Encounter: Payer: Self-pay | Admitting: Nurse Practitioner

## 2014-01-30 VITALS — BP 120/70 | HR 84 | Ht 66.0 in | Wt 131.1 lb

## 2014-01-30 DIAGNOSIS — I428 Other cardiomyopathies: Secondary | ICD-10-CM

## 2014-01-30 LAB — BASIC METABOLIC PANEL
BUN: 22 mg/dL (ref 6–23)
CO2: 28 mEq/L (ref 19–32)
Calcium: 9.1 mg/dL (ref 8.4–10.5)
Chloride: 104 mEq/L (ref 96–112)
Creatinine, Ser: 0.9 mg/dL (ref 0.4–1.2)
GFR: 65.68 mL/min (ref >60.00)
Glucose, Bld: 103 mg/dL — ABNORMAL HIGH (ref 70–99)
Potassium: 3.4 mEq/L — ABNORMAL LOW (ref 3.5–5.1)
Sodium: 140 mEq/L (ref 135–145)

## 2014-01-30 NOTE — Progress Notes (Signed)
Grace Robinson Date of Birth: [birth year] Medical Record #[id number]  History of Present Illness: Grace Robinson is seen back today for a follow up visit. Seen for Dr. Percival Spanish and Dr. Caryl Comes. She is an 80 year old female with a nonischemic CM with remote EF down to 25% that improved with aggressive medical therapy and CRT-D. She has CRT-D in place. Last generator change in 2014. Other issues include HLD, IDDM, ulcerative colitis, LBBB, Meniere's disease and advanced age. Last echo showed a normal EF in October of 2014.  She was last seen by Dr. Percival Spanish in October. Saw Dr. Caryl Comes just 2 weeks ago - BP was up. Diuretics were increased. Optivol index was elevated but not strikingly elevated.   Comes back today. Here alone. I have not seen her in several years. She is doing well. No chest pain. Not short of breath. No falls. Remains very active. No more swelling. Weight is stable. BP looks good. She is quite happy with how she is doing.   Current Outpatient Prescriptions  Medication Sig Dispense Refill  . aspirin EC 81 MG tablet Take 1 tablet (81 mg total) by mouth daily.      Marland Kitchen atorvastatin (LIPITOR) 20 MG tablet Take 20 mg by mouth daily.        . Biotin 1 MG CAPS Take 1,000 mg by mouth daily.      . furosemide (LASIX) 20 MG tablet Alternate taking 20 mg one day, take 40 mg the next day - repeating this pattern.  45 tablet  6  . gemfibrozil (LOPID) 600 MG tablet Take 600 mg by mouth 2 (two) times daily before a meal.      . insulin lispro (HUMALOG) 100 UNIT/ML injection Inject 10 Units into the skin 2 (two) times daily.      Marland Kitchen losartan (COZAAR) 100 MG tablet Take 100 mg by mouth daily.      . metoprolol succinate (TOPROL-XL) 100 MG 24 hr tablet Take 1 tablet by mouth 2 (two) times daily.      . Multiple Vitamins-Minerals (CENTRUM SILVER ADULT 50+ PO) Take by mouth daily.      . naproxen sodium (ANAPROX) 220 MG tablet Take 220 mg by mouth 2 (two) times daily as needed (for knee pain).      . potassium  chloride (K-DUR) 10 MEQ tablet Take 10 mEq by mouth daily.       . sitaGLIPtin (JANUVIA) 100 MG tablet Take 100 mg by mouth daily.      . vitamin C (ASCORBIC ACID) 500 MG tablet Take 1,000 mg by mouth daily.       No current facility-administered medications for this visit.    Allergies  Allergen Reactions  . Carvedilol   . Sudafed [Pseudoephedrine]     Makes skin feel like its crawling  . Vancomycin     Past Medical History  Diagnosis Date  . Ischemic cardiomyopathy   . Biventricular ICD (implantable cardiac defibrillator) in place   . Diabetes mellitus   . HTN (hypertension)   . Dyslipidemia   . Ulcerative colitis   . Meniere's disease   . LBBB (left bundle branch block)     chronic  . Colon polyps   . Cellulitis   . T wave oversensing 01/17/2014    Past Surgical History  Procedure Laterality Date  . Cardiac catheterization  08/2011  . Pacemaker insertion      Medtronic, remote- no.   . Abdominal hysterectomy    . Tonsillectomy  and adenoidectomy    . Ep study  08/22/08  . Abdominal hysterectomy    . Mastoid sugery      in the setting of Meniere's disease  . Pericardial effusion  12/2002    History  Smoking status  . Former Smoker  Smokeless tobacco  . Not on file    Comment: quit in 1970    History  Alcohol Use No    Family History  Problem Relation Age of Onset  . Emphysema Father 17  . Cancer Mother 16    Review of Systems: The review of systems is per the HPI.  All other systems were reviewed and are negative.  Physical Exam: BP 120/70  Pulse 84  Ht 5\' 6"  (1.676 m)  Wt 131 lb 1.9 oz (59.476 kg)  BMI 21.17 kg/m2  SpO2 100% Patient is very pleasant and in no acute distress. Skin is warm and dry. Color is normal.  HEENT is unremarkable. Normocephalic/atraumatic. PERRL. Sclera are nonicteric. Neck is supple. No masses. No JVD. Lungs are clear. Cardiac exam shows a regular rate and rhythm. Abdomen is soft. Extremities are without edema. Gait and  ROM are intact. No gross neurologic deficits noted.  Wt Readings from Last 3 Encounters:  01/30/14 131 lb 1.9 oz (59.476 kg)  01/17/14 132 lb (59.875 kg)  08/22/13 130 lb (58.968 kg)     LABORATORY DATA: BMET is pending  Lab Results  Component Value Date   WBC 4.3* 08/13/2013   HGB 12.5 08/13/2013   HCT 36.8 08/13/2013   PLT 150.0 08/13/2013   GLUCOSE 249* 08/13/2013   NA 141 08/13/2013   K 3.9 08/13/2013   CL 105 08/13/2013   CREATININE 0.9 08/13/2013   BUN 20 08/13/2013   CO2 24 08/13/2013   TSH 1.23 07/20/2011   INR 1.1* 08/13/2013   Echo Study Conclusions from February 2014  - Left ventricle: The cavity size was normal. Wall thickness was normal. Systolic function was normal. The estimated ejection fraction was in the range of 50% to 55%. Wall motion was normal; there were no regional wall motion abnormalities. Doppler parameters are consistent with abnormal left ventricular relaxation (grade 1 diastolic dysfunction). - Aortic valve: Mild regurgitation. - Mitral valve: Mild regurgitation. - Atrial septum: There was an atrial septal aneurysm.    Assessment / Plan: 1. NICM - with normalization of her EF - 50 to 55% by echo in February of 2014 - she is doing great clinically. I will see her back in 6 months. Recheck BMET today.   2. Underlying CRT-D - followed by Dr. Caryl Comes  3. HTN - BP is great. No change with her current regimen.   Patient is agreeable to this plan and will call if any problems develop in the interim.   Burtis Junes, RN, [facility] [address] [place], [place]  [id number] [phone]

## 2014-01-30 NOTE — Patient Instructions (Signed)
I think you are doing well  We will check lab today  I will see you in 6 months  Keep up the good work!  Call the [place] office at [phone] if you have any questions, problems or concerns.

## 2014-01-31 ENCOUNTER — Other Ambulatory Visit: Payer: Self-pay | Admitting: Internal Medicine

## 2014-01-31 ENCOUNTER — Other Ambulatory Visit: Payer: Self-pay | Admitting: *Deleted

## 2014-01-31 DIAGNOSIS — E876 Hypokalemia: Secondary | ICD-10-CM

## 2014-01-31 MED ORDER — POTASSIUM CHLORIDE ER 10 MEQ PO TBCR: 10.0000 meq | EXTENDED_RELEASE_TABLET | Freq: Two times a day (BID) | ORAL | Status: DC

## 2014-02-18 ENCOUNTER — Encounter: Payer: Self-pay | Admitting: *Deleted

## 2014-02-18 ENCOUNTER — Ambulatory Visit (INDEPENDENT_AMBULATORY_CARE_PROVIDER_SITE_OTHER): Payer: Medicare Other | Admitting: *Deleted

## 2014-02-18 DIAGNOSIS — I428 Other cardiomyopathies: Secondary | ICD-10-CM

## 2014-02-18 DIAGNOSIS — Z95810 Presence of automatic (implantable) cardiac defibrillator: Secondary | ICD-10-CM

## 2014-02-18 NOTE — Progress Notes (Signed)
EPIC Encounter for ICM Monitoring  Patient Name: Grace Robinson is a 80 y.o. female Date: 02/18/2014 Primary Care Physican: Precious Reel, MD Primary Cardiologist: Hochrein/ Cecille Rubin Gerhardt-NP Electrophysiologist: Allred Dry Weight: 129 lbs  Bi-V pacing: 98.3%   In the past month, have you:  1. Gained more than 2 pounds in a day or more than 5 pounds in a week? no  2. Had changes in your medications (with verification of current medications)? No (Dr. Caryl Comes started the patient on lasix 20 mg every other day alternating with 40 mg every other day on 01/17/14).  3. Had more shortness of breath than is usual for you? no  4. Limited your activity because of shortness of breath? no  5. Not been able to sleep because of shortness of breath? no  6. Had increased swelling in your feet or ankles? No. The patient does report dependent edema during the day that resolves during the night.   7. Had symptoms of dehydration (dizziness, dry mouth, increased thirst, decreased urine output) no  8. Had changes in sodium restriction? no  9. Been compliant with medication? Yes   ICM trend:   Follow-up plan: ICM clinic phone appointment: 03/21/14 (due for a repeat BMP this week for [facility]).  Copy of note sent to patient's primary care physician, primary cardiologist, and device following physician.  Emily Filbert, RN, BSN 02/18/2014 10:28 AM

## 2014-02-21 ENCOUNTER — Other Ambulatory Visit (INDEPENDENT_AMBULATORY_CARE_PROVIDER_SITE_OTHER): Payer: Medicare Other

## 2014-02-21 DIAGNOSIS — E876 Hypokalemia: Secondary | ICD-10-CM

## 2014-02-21 LAB — BASIC METABOLIC PANEL
BUN: 20 mg/dL (ref 6–23)
CO2: 29 mEq/L (ref 19–32)
Calcium: 9.1 mg/dL (ref 8.4–10.5)
Chloride: 103 mEq/L (ref 96–112)
Creatinine, Ser: 0.9 mg/dL (ref 0.4–1.2)
GFR: 66.56 mL/min (ref >60.00)
Glucose, Bld: 176 mg/dL — ABNORMAL HIGH (ref 70–99)
Potassium: 3.7 mEq/L (ref 3.5–5.1)
Sodium: 140 mEq/L (ref 135–145)

## 2014-02-22 ENCOUNTER — Telehealth: Payer: Self-pay | Admitting: Nurse Practitioner

## 2014-02-22 NOTE — Telephone Encounter (Signed)
Pt is aware of lab results.

## 2014-02-22 NOTE — Telephone Encounter (Signed)
New problem   Pt stated she received a call to call our office back.

## 2014-03-21 ENCOUNTER — Ambulatory Visit (INDEPENDENT_AMBULATORY_CARE_PROVIDER_SITE_OTHER): Payer: Medicare Other | Admitting: *Deleted

## 2014-03-21 DIAGNOSIS — Z95810 Presence of automatic (implantable) cardiac defibrillator: Secondary | ICD-10-CM

## 2014-03-21 DIAGNOSIS — I428 Other cardiomyopathies: Secondary | ICD-10-CM

## 2014-03-28 ENCOUNTER — Encounter: Payer: Self-pay | Admitting: *Deleted

## 2014-03-28 NOTE — Progress Notes (Signed)
EPIC Encounter for ICM Monitoring  Patient Name: Grace Robinson is a 80 y.o. female Date: 03/21/2014 Primary Care Physican: Precious Reel, MD Primary Cardiologist: Hochrein/ Truitt Merle- NP Electrophysiologist: Faustino Congress Weight: 129 lbs  Bi-V pacing: 98.5%       In the past month, have you:  1. Gained more than 2 pounds in a day or more than 5 pounds in a week? no  2. Had changes in your medications (with verification of current medications)? no  3. Had more shortness of breath than is usual for you? no  4. Limited your activity because of shortness of breath? no  5. Not been able to sleep because of shortness of breath? no  6. Had increased swelling in your feet or ankles? no  7. Had symptoms of dehydration (dizziness, dry mouth, increased thirst, decreased urine output) no  8. Had changes in sodium restriction? no  9. Been compliant with medication? Yes   ICM trend:   Follow-up plan: ICM clinic phone appointment: 04/22/14 (full transmission)  Copy of note sent to patient's primary care physician, primary cardiologist, and device following physician.  Emily Filbert, RN, BSN 03/28/2014 5:49 PM

## 2014-04-22 ENCOUNTER — Encounter: Payer: Self-pay | Admitting: *Deleted

## 2014-04-22 ENCOUNTER — Ambulatory Visit (INDEPENDENT_AMBULATORY_CARE_PROVIDER_SITE_OTHER): Payer: Medicare Other | Admitting: *Deleted

## 2014-04-22 ENCOUNTER — Encounter: Payer: Self-pay | Admitting: Internal Medicine

## 2014-04-22 DIAGNOSIS — I428 Other cardiomyopathies: Secondary | ICD-10-CM

## 2014-04-22 DIAGNOSIS — Z95810 Presence of automatic (implantable) cardiac defibrillator: Secondary | ICD-10-CM

## 2014-04-22 LAB — MDC_IDC_ENUM_SESS_TYPE_REMOTE
Battery Voltage: 3 V
Brady Statistic RA Percent Paced: 28.18 %
Brady Statistic RV Percent Paced: 0.52 %
Date Time Interrogation Session: 20150622101605
HighPow Impedance: 190 Ohm
HighPow Impedance: 47 Ohm
HighPow Impedance: 58 Ohm
Lead Channel Impedance Value: 4047 Ohm
Lead Channel Impedance Value: 418 Ohm
Lead Channel Impedance Value: 456 Ohm
Lead Channel Impedance Value: 456 Ohm
Lead Channel Pacing Threshold Amplitude: 0.625 V
Lead Channel Pacing Threshold Pulse Width: 0.4 ms
Lead Channel Pacing Threshold Pulse Width: 0.4 ms
Lead Channel Sensing Intrinsic Amplitude: 1.25 mV
Lead Channel Sensing Intrinsic Amplitude: 1.25 mV
Lead Channel Sensing Intrinsic Amplitude: 26.125 mV
Lead Channel Sensing Intrinsic Amplitude: 26.125 mV
Lead Channel Setting Pacing Amplitude: 2.25 V
Lead Channel Setting Pacing Pulse Width: 0.4 ms
MDC IDC MSMT BATTERY REMAINING LONGEVITY: 101 mo
MDC IDC MSMT LEADCHNL LV IMPEDANCE VALUE: 4047 Ohm
MDC IDC MSMT LEADCHNL LV PACING THRESHOLD AMPLITUDE: 0.625 V
MDC IDC MSMT LEADCHNL RA PACING THRESHOLD PULSEWIDTH: 0.4 ms
MDC IDC MSMT LEADCHNL RV PACING THRESHOLD AMPLITUDE: 1 V
MDC IDC SET LEADCHNL LV PACING PULSEWIDTH: 0.4 ms
MDC IDC SET LEADCHNL RA PACING AMPLITUDE: 2 V
MDC IDC SET LEADCHNL RV PACING AMPLITUDE: 2.5 V
MDC IDC SET LEADCHNL RV SENSING SENSITIVITY: 0.6 mV
MDC IDC SET ZONE DETECTION INTERVAL: 300 ms
MDC IDC STAT BRADY AP VP PERCENT: 27.8 %
MDC IDC STAT BRADY AP VS PERCENT: 0.37 %
MDC IDC STAT BRADY AS VP PERCENT: 70.79 %
MDC IDC STAT BRADY AS VS PERCENT: 1.03 %
Zone Setting Detection Interval: 240 ms
Zone Setting Detection Interval: 350 ms
Zone Setting Detection Interval: 450 ms
Zone Setting Detection Interval: 460 ms

## 2014-04-22 NOTE — Progress Notes (Signed)
EPIC Encounter for ICM Monitoring  Patient Name: Grace Robinson is a 80 y.o. female Date: 04/22/2014 Primary Care Physican: Precious Reel, MD Primary Cardiologist: Hochrein/ Truitt Merle, NP Electrophysiologist: Faustino Congress Weight: 129 lbs  Bi-V pacing: 98.4%       In the past month, have you:  1. Gained more than 2 pounds in a day or more than 5 pounds in a week? no  2. Had changes in your medications (with verification of current medications)? no  3. Had more shortness of breath than is usual for you? no  4. Limited your activity because of shortness of breath? no  5. Not been able to sleep because of shortness of breath? no  6. Had increased swelling in your feet or ankles? no  7. Had symptoms of dehydration (dizziness, dry mouth, increased thirst, decreased urine output) no  8. Had changes in sodium restriction? no  9. Been compliant with medication? Yes  ** The patient states she did have to have a hole made in her eardrum with an injection of prednisone directly in to the ear for her Meniere's disease, but otherwise, she is doing quite well **   ICM trend:   Follow-up plan: ICM clinic phone appointment: 05/23/14. The patient will be leaving for the beach on Saturday. I did advise that even though we are closely watching her fluid readings to make sure she is not retaining fluid, she should also stay carefully hydrated with the heat.  Copy of note sent to patient's primary care physician, primary cardiologist, and device following physician.  Alvis Lemmings, RN, BSN 04/22/2014 9:30 AM

## 2014-04-22 NOTE — Progress Notes (Signed)
Remote ICD transmission.   

## 2014-04-23 ENCOUNTER — Other Ambulatory Visit: Payer: Self-pay | Admitting: Internal Medicine

## 2014-05-07 ENCOUNTER — Encounter: Payer: Self-pay | Admitting: Cardiology

## 2014-05-23 ENCOUNTER — Encounter: Payer: Self-pay | Admitting: *Deleted

## 2014-05-23 ENCOUNTER — Ambulatory Visit (INDEPENDENT_AMBULATORY_CARE_PROVIDER_SITE_OTHER): Payer: Medicare Other | Admitting: *Deleted

## 2014-05-23 DIAGNOSIS — Z95810 Presence of automatic (implantable) cardiac defibrillator: Secondary | ICD-10-CM

## 2014-05-23 DIAGNOSIS — I428 Other cardiomyopathies: Secondary | ICD-10-CM

## 2014-05-23 NOTE — Progress Notes (Signed)
EPIC Encounter for ICM Monitoring  Patient Name: Grace Robinson is a 80 y.o. female Date: 05/23/2014 Primary Care Physican: Precious Reel, MD Primary Cardiologist: Hochrein/ Truitt Merle, NP Electrophysiologist: Faustino Congress Weight: 129 lbs  Bi-V pacing: 98.4%       In the past month, have you:  1. Gained more than 2 pounds in a day or more than 5 pounds in a week? no  2. Had changes in your medications (with verification of current medications)? no  3. Had more shortness of breath than is usual for you? no  4. Limited your activity because of shortness of breath? no  5. Not been able to sleep because of shortness of breath? no  6. Had increased swelling in your feet or ankles? no  7. Had symptoms of dehydration (dizziness, dry mouth, increased thirst, decreased urine output) no  8. Had changes in sodium restriction? no  9. Been compliant with medication? Yes   ICM trend:   Follow-up plan: ICM clinic phone appointment: 08/12/14. The patient has follow up scheduled with Dr. Caryl Comes on 07/09/14. I have advised the patient to call if she develops any symptoms prior to her appointment with Dr. Caryl Comes.  Copy of note sent to patient's primary care physician, primary cardiologist, and device following physician.  Alvis Lemmings, RN, BSN 05/23/2014 12:40 PM

## 2014-07-09 ENCOUNTER — Encounter: Payer: Self-pay | Admitting: Internal Medicine

## 2014-07-09 ENCOUNTER — Ambulatory Visit (INDEPENDENT_AMBULATORY_CARE_PROVIDER_SITE_OTHER): Payer: Medicare Other | Admitting: Internal Medicine

## 2014-07-09 ENCOUNTER — Ambulatory Visit: Payer: Medicare Other | Admitting: Nurse Practitioner

## 2014-07-09 VITALS — BP 180/74 | HR 87 | Ht 66.5 in | Wt 137.8 lb

## 2014-07-09 DIAGNOSIS — I471 Supraventricular tachycardia: Secondary | ICD-10-CM

## 2014-07-09 DIAGNOSIS — I428 Other cardiomyopathies: Secondary | ICD-10-CM

## 2014-07-09 DIAGNOSIS — I5022 Chronic systolic (congestive) heart failure: Secondary | ICD-10-CM

## 2014-07-09 DIAGNOSIS — I472 Ventricular tachycardia: Secondary | ICD-10-CM

## 2014-07-09 DIAGNOSIS — I4729 Other ventricular tachycardia: Secondary | ICD-10-CM

## 2014-07-09 DIAGNOSIS — I498 Other specified cardiac arrhythmias: Secondary | ICD-10-CM

## 2014-07-09 DIAGNOSIS — Z95810 Presence of automatic (implantable) cardiac defibrillator: Secondary | ICD-10-CM

## 2014-07-09 LAB — MDC_IDC_ENUM_SESS_TYPE_INCLINIC
Battery Remaining Longevity: 104 mo
Brady Statistic AP VS Percent: 0.3 %
Brady Statistic AS VS Percent: 1.08 %
Brady Statistic RA Percent Paced: 23.24 %
Brady Statistic RV Percent Paced: 0.65 %
Date Time Interrogation Session: 20150908152842
HighPow Impedance: 190 Ohm
HighPow Impedance: 48 Ohm
HighPow Impedance: 67 Ohm
Lead Channel Impedance Value: 4047 Ohm
Lead Channel Impedance Value: 475 Ohm
Lead Channel Pacing Threshold Amplitude: 0.625 V
Lead Channel Pacing Threshold Amplitude: 1.125 V
Lead Channel Sensing Intrinsic Amplitude: 1.5 mV
Lead Channel Sensing Intrinsic Amplitude: 25.625 mV
Lead Channel Setting Pacing Amplitude: 1.75 V
Lead Channel Setting Pacing Amplitude: 2.5 V
Lead Channel Setting Sensing Sensitivity: 0.6 mV
MDC IDC MSMT BATTERY VOLTAGE: 3.01 V
MDC IDC MSMT LEADCHNL LV IMPEDANCE VALUE: 4047 Ohm
MDC IDC MSMT LEADCHNL LV IMPEDANCE VALUE: 456 Ohm
MDC IDC MSMT LEADCHNL LV PACING THRESHOLD AMPLITUDE: 0.75 V
MDC IDC MSMT LEADCHNL LV PACING THRESHOLD PULSEWIDTH: 0.4 ms
MDC IDC MSMT LEADCHNL RA PACING THRESHOLD PULSEWIDTH: 0.4 ms
MDC IDC MSMT LEADCHNL RA SENSING INTR AMPL: 1.25 mV
MDC IDC MSMT LEADCHNL RV IMPEDANCE VALUE: 456 Ohm
MDC IDC MSMT LEADCHNL RV PACING THRESHOLD PULSEWIDTH: 0.4 ms
MDC IDC MSMT LEADCHNL RV SENSING INTR AMPL: 30.5 mV
MDC IDC SET LEADCHNL LV PACING PULSEWIDTH: 0.4 ms
MDC IDC SET LEADCHNL RA PACING AMPLITUDE: 2 V
MDC IDC SET LEADCHNL RV PACING PULSEWIDTH: 0.4 ms
MDC IDC SET ZONE DETECTION INTERVAL: 460 ms
MDC IDC STAT BRADY AP VP PERCENT: 22.93 %
MDC IDC STAT BRADY AS VP PERCENT: 75.68 %
Zone Setting Detection Interval: 240 ms
Zone Setting Detection Interval: 300 ms
Zone Setting Detection Interval: 350 ms
Zone Setting Detection Interval: 450 ms

## 2014-07-09 NOTE — Progress Notes (Signed)
Patient Care Team: Precious Reel, MD as PCP - General (Internal Medicine)   HPI  Grace Robinson is a 80 y.o. female is seen in followup for nonischemic cardiomyopathy complicated by congestive heart failure. She is status post CRT-D implantation was further compromised by a 6949-lead which failed prompting [place] to pirate her previously implanted pacemaker lead . This was done in 2007  There have been intercurrent normalization of LV functionwith an echo 2014 demonstrating near normal left ventricular function and mild AV valvular regurgitation  She underwent generator replacement 0ct 2014; she's had some tingling over the incision which is somewhat better but still persists. There has been no erythema.  The patient denies chest pain, There have been no palpitations, lightheadedness or syncop2e  She has noted that there has been increasing fatigue of late. This is not really company by shortness of breath but is accompanied by edema. There has been no chest discomfort.   Past Medical History  Diagnosis Date  . Ischemic cardiomyopathy   . Biventricular ICD (implantable cardiac defibrillator) in place   . Diabetes mellitus   . HTN (hypertension)   . Dyslipidemia   . Ulcerative colitis   . Meniere's disease   . LBBB (left bundle branch block)     chronic  . Colon polyps   . Cellulitis   . T wave oversensing 01/17/2014    Past Surgical History  Procedure Laterality Date  . Cardiac catheterization  08/2011  . Pacemaker insertion      Medtronic, remote- no.   . Abdominal hysterectomy    . Tonsillectomy and adenoidectomy    . Ep study  08/22/08  . Abdominal hysterectomy    . Mastoid sugery      in the setting of Meniere's disease  . Pericardial effusion  12/2002    Current Outpatient Prescriptions  Medication Sig Dispense Refill  . aspirin EC 81 MG tablet Take 1 tablet (81 mg total) by mouth daily.      Marland Kitchen atorvastatin (LIPITOR) 20 MG tablet Take 20 mg by mouth daily.         . Biotin 1 MG CAPS Take 1,000 mg by mouth daily.      . furosemide (LASIX) 20 MG tablet Alternate taking 20 mg one day, take 40 mg the next day - repeating this pattern.  45 tablet  6  . gemfibrozil (LOPID) 600 MG tablet Take 600 mg by mouth 2 (two) times daily before a meal.      . insulin lispro (HUMALOG) 100 UNIT/ML injection Inject 10 Units into the skin 2 (two) times daily.      Marland Kitchen KLOR-CON M10 10 MEQ tablet TAKE 1 TABLET (10 MEQ TOTAL) BY MOUTH 2 (TWO) TIMES DAILY.  60 tablet  3  . losartan (COZAAR) 100 MG tablet Take 100 mg by mouth daily.      . metoprolol succinate (TOPROL-XL) 100 MG 24 hr tablet Take 1 tablet by mouth 2 (two) times daily.      . Multiple Vitamins-Minerals (CENTRUM SILVER ADULT 50+ PO) Take by mouth daily.      . naproxen sodium (ANAPROX) 220 MG tablet Take 220 mg by mouth 2 (two) times daily as needed (for knee pain).      Marland Kitchen sitaGLIPtin (JANUVIA) 100 MG tablet Take 100 mg by mouth daily.       No current facility-administered medications for this visit.    Allergies  Allergen Reactions  . Carvedilol   .  Sudafed [Pseudoephedrine]     Makes skin feel like its crawling  . Vancomycin     Review of Systems negative except from HPI and PMH  Physical Exam BP 180/74  Pulse 87  Ht 5' 6.5" (1.689 m)  Wt 137 lb 12.8 oz (62.506 kg)  BMI 21.91 kg/m2 Well developed and well nourished in no acute distress HENT normal E scleral and icterus clear Neck Supple JVP 7-8; carotids brisk and full Clear to ausculation  Regular rate and rhythm, no murmurs gallops or rub Soft with active bowel sounds No clubbing cyanosis tr Edema Alert and oriented, grossly normal motor and sensory function Skin Warm and Dry  ECG demonstrates AV pacing  Assessment and  Plan  Nonischemic cardiomyopathy  Congestive heart failure ? HFpEF  CRT-D-Medtronic  Hypertension-poorly controlled  Mrs. Milana Obey is complaining of increasing fatigue of late. Cardiac perspective who suggested  hypertension may be contributing, I would have to worry about rate current deterioration of LV systolic function as well as the fact that there is a somewhat right she is on her heart excursion from her device.  For now I will ask her to increase her furosemide from 20--40 daily. She will concomitantly increased potassium.  She is to see Dr. Virgina Jock next week. If her blood pressure still elevated, might be reasonable to try her on amlodipine.  We'll obtain a 2-D echo. Will defer for it now reprogrammed her device

## 2014-07-09 NOTE — Patient Instructions (Addendum)
Your physician has recommended you make the following change in your medication:  1) INCREASE Furosemide to 40 mg daily  Your physician has requested that you have an echocardiogram. Echocardiography is a painless test that uses sound waves to create images of your heart. It provides your doctor with information about the size and shape of your heart and how well your heart's chambers and valves are working. This procedure takes approximately one hour. There are no restrictions for this procedure.  Remote monitoring is used to monitor your ICD from home. This monitoring reduces the number of office visits required to check your device to one time per year. It allows [place] to keep an eye on the functioning of your device to ensure it is working properly. You are scheduled for a device check from home on 10-10-2014. You may send your transmission at any time that day. If you have a wireless device, the transmission will be sent automatically. After your physician reviews your transmission, you will receive a postcard with your next transmission date.  Your physician recommends that you schedule a follow-up appointment in: 12 months with Dr.Klein

## 2014-07-10 ENCOUNTER — Ambulatory Visit (HOSPITAL_COMMUNITY): Payer: Medicare Other | Attending: Cardiology

## 2014-07-10 DIAGNOSIS — I359 Nonrheumatic aortic valve disorder, unspecified: Secondary | ICD-10-CM | POA: Diagnosis not present

## 2014-07-10 DIAGNOSIS — L0291 Cutaneous abscess, unspecified: Secondary | ICD-10-CM | POA: Insufficient documentation

## 2014-07-10 DIAGNOSIS — E785 Hyperlipidemia, unspecified: Secondary | ICD-10-CM | POA: Diagnosis not present

## 2014-07-10 DIAGNOSIS — H8109 Meniere's disease, unspecified ear: Secondary | ICD-10-CM | POA: Diagnosis not present

## 2014-07-10 DIAGNOSIS — I379 Nonrheumatic pulmonary valve disorder, unspecified: Secondary | ICD-10-CM | POA: Diagnosis not present

## 2014-07-10 DIAGNOSIS — I447 Left bundle-branch block, unspecified: Secondary | ICD-10-CM | POA: Diagnosis not present

## 2014-07-10 DIAGNOSIS — I428 Other cardiomyopathies: Secondary | ICD-10-CM | POA: Diagnosis present

## 2014-07-10 DIAGNOSIS — I5022 Chronic systolic (congestive) heart failure: Secondary | ICD-10-CM

## 2014-07-10 DIAGNOSIS — I471 Supraventricular tachycardia: Secondary | ICD-10-CM

## 2014-07-10 DIAGNOSIS — L0390 Cellulitis, unspecified: Secondary | ICD-10-CM

## 2014-07-10 DIAGNOSIS — E119 Type 2 diabetes mellitus without complications: Secondary | ICD-10-CM | POA: Insufficient documentation

## 2014-07-10 DIAGNOSIS — I10 Essential (primary) hypertension: Secondary | ICD-10-CM | POA: Diagnosis not present

## 2014-07-10 NOTE — Progress Notes (Signed)
2D Echo completed. 07/10/2014

## 2014-08-12 ENCOUNTER — Encounter: Payer: Self-pay | Admitting: *Deleted

## 2014-08-12 ENCOUNTER — Ambulatory Visit (INDEPENDENT_AMBULATORY_CARE_PROVIDER_SITE_OTHER): Payer: Medicare Other | Admitting: *Deleted

## 2014-08-12 DIAGNOSIS — I428 Other cardiomyopathies: Secondary | ICD-10-CM

## 2014-08-12 DIAGNOSIS — I429 Cardiomyopathy, unspecified: Secondary | ICD-10-CM

## 2014-08-12 DIAGNOSIS — Z95810 Presence of automatic (implantable) cardiac defibrillator: Secondary | ICD-10-CM

## 2014-08-12 NOTE — Progress Notes (Signed)
EPIC Encounter for ICM Monitoring  Patient Name: Grace Robinson is a 80 y.o. female Date: 08/12/2014 Primary Care Physican: Precious Reel, MD Primary Cardiologist: Hochrein/ Truitt Merle, NP Electrophysiologist: Faustino Congress Weight: 134 lbs       In the past month, have you:  1. Gained more than 2 pounds in a day or more than 5 pounds in a week? no  2. Had changes in your medications (with verification of current medications)? Yes. The patient saw Dr. Caryl Comes on 07/09/14 and lasix was increased to 40 mg daily and potassium increased to 20 meq daily.  3. Had more shortness of breath than is usual for you? no  4. Limited your activity because of shortness of breath? no  5. Not been able to sleep because of shortness of breath? no  6. Had increased swelling in your feet or ankles? Yes. The patient has had swelling to her lower extremities. She reported this to Dr. Caryl Comes at her last office visit, prompting the change in her lasix dose and a repeat echo. She has her echo done on 9/9 and her EF was 50-55%. Swelling does improve over night. The patient has support stockings at home and she will try wearing these during the day.   7. Had symptoms of dehydration (dizziness, dry mouth, increased thirst, decreased urine output) no  8. Had changes in sodium restriction? no  9. Been compliant with medication? Yes   ICM trend:   Follow-up plan: ICM clinic phone appointment: 09/12/14.  Copy of note sent to patient's primary care physician, primary cardiologist, and device following physician.  Alvis Lemmings, RN, BSN 08/12/2014 11:33 AM

## 2014-09-12 ENCOUNTER — Ambulatory Visit (INDEPENDENT_AMBULATORY_CARE_PROVIDER_SITE_OTHER): Payer: Medicare Other | Admitting: *Deleted

## 2014-09-12 DIAGNOSIS — Z95810 Presence of automatic (implantable) cardiac defibrillator: Secondary | ICD-10-CM

## 2014-09-12 DIAGNOSIS — I428 Other cardiomyopathies: Secondary | ICD-10-CM

## 2014-09-12 DIAGNOSIS — I429 Cardiomyopathy, unspecified: Secondary | ICD-10-CM

## 2014-09-13 ENCOUNTER — Encounter: Payer: Self-pay | Admitting: *Deleted

## 2014-09-13 NOTE — Progress Notes (Signed)
EPIC Encounter for ICM Monitoring  Patient Name: Grace Robinson is a 80 y.o. female Date: 09/13/2014 Primary Care Physican: Precious Reel, MD Primary Cardiologist: Hilbert Corrigan, NP Electrophysiologist: Faustino Congress Weight: 134 lbs       In the past month, have you:  1. Gained more than 2 pounds in a day or more than 5 pounds in a week? no  2. Had changes in your medications (with verification of current medications)? no  3. Had more shortness of breath than is usual for you? no  4. Limited your activity because of shortness of breath? no  5. Not been able to sleep because of shortness of breath? no  6. Had increased swelling in your feet or ankles? no  7. Had symptoms of dehydration (dizziness, dry mouth, increased thirst, decreased urine output) no  8. Had changes in sodium restriction? no  9. Been compliant with medication? Yes   ICM trend:   Follow-up plan: ICM clinic phone appointment: 10/17/14  Copy of note sent to patient's primary care physician, primary cardiologist, and device following physician.  Alvis Lemmings, RN, BSN 09/13/2014 5:23 PM

## 2014-10-02 ENCOUNTER — Ambulatory Visit (INDEPENDENT_AMBULATORY_CARE_PROVIDER_SITE_OTHER): Payer: Medicare Other | Admitting: Nurse Practitioner

## 2014-10-02 ENCOUNTER — Ambulatory Visit (INDEPENDENT_AMBULATORY_CARE_PROVIDER_SITE_OTHER): Payer: Medicare Other | Admitting: *Deleted

## 2014-10-02 VITALS — BP 150/80 | HR 85 | Ht 66.5 in | Wt 136.4 lb

## 2014-10-02 DIAGNOSIS — I428 Other cardiomyopathies: Secondary | ICD-10-CM

## 2014-10-02 DIAGNOSIS — Z23 Encounter for immunization: Secondary | ICD-10-CM

## 2014-10-02 DIAGNOSIS — I5022 Chronic systolic (congestive) heart failure: Secondary | ICD-10-CM

## 2014-10-02 DIAGNOSIS — I429 Cardiomyopathy, unspecified: Secondary | ICD-10-CM

## 2014-10-02 DIAGNOSIS — I10 Essential (primary) hypertension: Secondary | ICD-10-CM

## 2014-10-02 NOTE — Progress Notes (Signed)
Janeth Rase Maser Date of Birth: Nov 25, 1925 Medical Record #[id number]  History of Present Illness: Kadince is seen back today for a follow up visit. This is a 3 month check. Seen for Dr. Percival Spanish and Dr. Caryl Comes. She is an 80 year old female with a nonischemic CM with remote EF down to 25% that improved with aggressive medical therapy and CRT-D. She has CRT-D in place. Last generator change in 2014. Other issues include HLD, IDDM, ulcerative colitis, LBBB, Meniere's disease and advanced age. Last echo showed a normal EF in October of 2014.  She was last seen by Dr. Caryl Comes back in September - BP was up and her Lasix was increased along with potassium. Was to follow up with PCP. Echo updated.   Comes back today. Here alone. She has not chest pain. Not short of breath. She has been more fatigued. Does not "feel good". Lots of joint stiffness. Hoarse. She has had lots of stress with a daughter that she is estranged from. Still trying to deal with an alcoholic husband even though he has been deceased for 39 years. Very tearful. Admits she is sad.   Current Outpatient Prescriptions  Medication Sig Dispense Refill  . aspirin EC 81 MG tablet Take 1 tablet (81 mg total) by mouth daily.    Marland Kitchen atorvastatin (LIPITOR) 20 MG tablet Take 20 mg by mouth daily.      . Biotin 1 MG CAPS Take 1,000 mg by mouth daily.    . furosemide (LASIX) 20 MG tablet Alternate taking 20 mg one day, take 40 mg the next day - repeating this pattern. 45 tablet 6  . gemfibrozil (LOPID) 600 MG tablet Take 600 mg by mouth 2 (two) times daily before a meal.    . insulin lispro (HUMALOG) 100 UNIT/ML injection Inject 10 Units into the skin 2 (two) times daily.    Marland Kitchen KLOR-CON M10 10 MEQ tablet TAKE 1 TABLET (10 MEQ TOTAL) BY MOUTH 2 (TWO) TIMES DAILY. 60 tablet 3  . losartan (COZAAR) 100 MG tablet Take 100 mg by mouth daily.    . metoprolol succinate (TOPROL-XL) 100 MG 24 hr tablet Take 1 tablet by mouth 2 (two) times daily.    . Multiple  Vitamins-Minerals (CENTRUM SILVER ADULT 50+ PO) Take by mouth daily.    . naproxen sodium (ANAPROX) 220 MG tablet Take 220 mg by mouth 2 (two) times daily as needed (for knee pain).    Marland Kitchen sitaGLIPtin (JANUVIA) 100 MG tablet Take 100 mg by mouth daily.     No current facility-administered medications for this visit.    Allergies  Allergen Reactions  . Carvedilol   . Sudafed [Pseudoephedrine]     Makes skin feel like its crawling  . Vancomycin     Past Medical History  Diagnosis Date  . Ischemic cardiomyopathy   . Biventricular ICD (implantable cardiac defibrillator) in place   . Diabetes mellitus   . HTN (hypertension)   . Dyslipidemia   . Ulcerative colitis   . Meniere's disease   . LBBB (left bundle branch block)     chronic  . Colon polyps   . Cellulitis   . T wave oversensing 01/17/2014    Past Surgical History  Procedure Laterality Date  . Cardiac catheterization  08/2011  . Pacemaker insertion      Medtronic, remote- no.   . Abdominal hysterectomy    . Tonsillectomy and adenoidectomy    . Ep study  08/22/08  . Abdominal hysterectomy    .  Mastoid sugery      in the setting of Meniere's disease  . Pericardial effusion  12/2002    History  Smoking status  . Former Smoker  Smokeless tobacco  . Not on file    Comment: quit in 1970    History  Alcohol Use No    Family History  Problem Relation Age of Onset  . Emphysema Father 46  . Cancer Mother 33    Review of Systems: The review of systems is per the HPI.  All other systems were reviewed and are negative.  Physical Exam: BP 150/80 mmHg  Pulse 85  Ht 5' 6.5" (1.689 m)  Wt 136 lb 6.4 oz (61.871 kg)  BMI 21.69 kg/m2  SpO2 99% Patient is very pleasant and in no acute distress. Skin is warm and dry. Color is normal.  HEENT is unremarkable. Normocephalic/atraumatic. PERRL. Sclera are nonicteric. Neck is supple. No masses. No JVD. Lungs are clear. Cardiac exam shows a regular rate and rhythm. Abdomen is  soft. Extremities are without edema. Gait and ROM are intact. No gross neurologic deficits noted.  Wt Readings from Last 3 Encounters:  10/02/14 136 lb 6.4 oz (61.871 kg)  07/09/14 137 lb 12.8 oz (62.506 kg)  01/30/14 131 lb 1.9 oz (59.476 kg)    LABORATORY DATA/PROCEDURES:  Lab Results  Component Value Date   WBC 4.3* 08/13/2013   HGB 12.5 08/13/2013   HCT 36.8 08/13/2013   PLT 150.0 08/13/2013   GLUCOSE 176* 02/21/2014   NA 140 02/21/2014   K 3.7 02/21/2014   CL 103 02/21/2014   CREATININE 0.9 02/21/2014   BUN 20 02/21/2014   CO2 29 02/21/2014   TSH 1.23 07/20/2011   INR 1.1* 08/13/2013    BNP (last 3 results) No results for input(s): PROBNP in the last 8760 hours.  Echo Study Conclusions from September 2015  - Left ventricle: The cavity size was normal. Systolic function was normal. The estimated ejection fraction was in the range of 50% to 55%. There was an increased relative contribution of atrial contraction to ventricular filling. Doppler parameters are consistent with abnormal left ventricular relaxation (grade 1 diastolic dysfunction). - Ventricular septum: Septal motion showed paradox. These changes are consistent with right ventricular pacing. - Aortic valve: There was mild regurgitation. - Mitral valve: There is a calcified mobile density in the LV cavity most consistent with calcified redundant chordae tendinae. This was present on echo in 2014. There was mild regurgitation. - Pulmonic valve: There was trivial regurgitation.  Assessment / Plan: 1. NICM - with normalization of her EF - 50 to 55% by echo from September of 2015 - her cardiac status is ok.   2. Underlying CRT-D - followed by Dr. Caryl Comes  3. HTN - BP is up but she is pretty upset today - seeing PCP tomorrow.   4. Significant stress - discussed at length today.   I will see her back in 4 months. She is seeing PCP tomorrow. Flu shot today.   Patient is agreeable to this  plan and will call if any problems develop in the interim.   Burtis Junes, RN, [facility] [address] [place], [place]  [phone]

## 2014-10-02 NOTE — Patient Instructions (Signed)
Stay on your current medicines  See me in 4 months  Call the [place] office at [phone] if you have any questions, problems or concerns.

## 2014-10-10 ENCOUNTER — Encounter (HOSPITAL_COMMUNITY): Payer: Self-pay | Admitting: Internal Medicine

## 2014-10-17 ENCOUNTER — Ambulatory Visit (INDEPENDENT_AMBULATORY_CARE_PROVIDER_SITE_OTHER): Payer: Medicare Other | Admitting: *Deleted

## 2014-10-17 ENCOUNTER — Encounter: Payer: Self-pay | Admitting: *Deleted

## 2014-10-17 DIAGNOSIS — I428 Other cardiomyopathies: Secondary | ICD-10-CM

## 2014-10-17 DIAGNOSIS — I429 Cardiomyopathy, unspecified: Secondary | ICD-10-CM

## 2014-10-17 DIAGNOSIS — Z95810 Presence of automatic (implantable) cardiac defibrillator: Secondary | ICD-10-CM

## 2014-10-17 NOTE — Progress Notes (Signed)
EPIC Encounter for ICM Monitoring  Patient Name: Grace Robinson is a 80 y.o. female Date: 10/17/2014 Primary Care Physican: Precious Reel, MD Primary Cardiologist: Hochrein/ Truitt Merle, NP Electrophysiologist: Faustino Congress Weight: 134 lbs       In the past month, have you:  1. Gained more than 2 pounds in a day or more than 5 pounds in a week? no  2. Had changes in your medications (with verification of current medications)? no  3. Had more shortness of breath than is usual for you? no  4. Limited your activity because of shortness of breath? no  5. Not been able to sleep because of shortness of breath? no  6. Had increased swelling in your feet or ankles? no  7. Had symptoms of dehydration (dizziness, dry mouth, increased thirst, decreased urine output) no  8. Had changes in sodium restriction? no  9. Been compliant with medication? Yes   ICM trend:   Follow-up plan: ICM clinic phone appointment: 12/30/14. The patient is doing well. She will be going out of town around 11/17/14 to visit family in [place]. She hopes to be gone for about 3-4 weeks. I will follow back up with her in February. I encouraged her to call with any concerns. She voices understanding.   Copy of note sent to patient's primary care physician, primary cardiologist, and device following physician.  Alvis Lemmings, RN, BSN 10/17/2014 2:03 PM

## 2014-11-13 ENCOUNTER — Other Ambulatory Visit: Payer: Self-pay

## 2014-11-13 ENCOUNTER — Other Ambulatory Visit: Payer: Self-pay | Admitting: Cardiology

## 2014-12-30 ENCOUNTER — Ambulatory Visit (INDEPENDENT_AMBULATORY_CARE_PROVIDER_SITE_OTHER): Payer: Medicare Other | Admitting: *Deleted

## 2014-12-30 ENCOUNTER — Telehealth: Payer: Self-pay | Admitting: *Deleted

## 2014-12-30 DIAGNOSIS — I429 Cardiomyopathy, unspecified: Secondary | ICD-10-CM

## 2014-12-30 DIAGNOSIS — I428 Other cardiomyopathies: Secondary | ICD-10-CM

## 2014-12-30 DIAGNOSIS — Z95810 Presence of automatic (implantable) cardiac defibrillator: Secondary | ICD-10-CM

## 2014-12-30 NOTE — Telephone Encounter (Signed)
ICM transmission received. I left a message for the patient to call. 

## 2015-01-01 NOTE — Progress Notes (Signed)
EPIC Encounter for ICM Monitoring  Patient Name: Grace Robinson is a 81 y.o. female Date: 01/01/2015 Primary Care Physican: Precious Reel, MD Primary Cardiologist: Hochrein/ Truitt Merle, NP Electrophysiologist: Faustino Congress Weight: 134 lbs       In the past month, have you:  1. Gained more than 2 pounds in a day or more than 5 pounds in a week? no  2. Had changes in your medications (with verification of current medications)? no  3. Had more shortness of breath than is usual for you? no  4. Limited your activity because of shortness of breath? no  5. Not been able to sleep because of shortness of breath? no  6. Had increased swelling in your feet or ankles? no  7. Had symptoms of dehydration (dizziness, dry mouth, increased thirst, decreased urine output) no  8. Had changes in sodium restriction? no  9. Been compliant with medication? Yes   ICM trend:   Follow-up plan: ICM clinic phone appointment: 02/03/15. The patient has been doing well. She spent the last 5 weeks in [place] with her family. She was very active and enjoyed the warm weather!  Copy of note sent to patient's primary care physician, primary cardiologist, and device following physician.  Alvis Lemmings, RN, BSN 01/01/2015 3:31 PM

## 2015-01-01 NOTE — Telephone Encounter (Signed)
I spoke with the patient. 

## 2015-01-07 ENCOUNTER — Encounter: Payer: Self-pay | Admitting: *Deleted

## 2015-02-03 ENCOUNTER — Ambulatory Visit (INDEPENDENT_AMBULATORY_CARE_PROVIDER_SITE_OTHER): Payer: Medicare Other | Admitting: *Deleted

## 2015-02-03 DIAGNOSIS — I428 Other cardiomyopathies: Secondary | ICD-10-CM | POA: Diagnosis not present

## 2015-02-03 DIAGNOSIS — Z95810 Presence of automatic (implantable) cardiac defibrillator: Secondary | ICD-10-CM | POA: Diagnosis not present

## 2015-02-03 DIAGNOSIS — I429 Cardiomyopathy, unspecified: Secondary | ICD-10-CM

## 2015-02-05 ENCOUNTER — Telehealth: Payer: Self-pay | Admitting: *Deleted

## 2015-02-05 NOTE — Telephone Encounter (Signed)
ICM transmission received. I left a message for the patient to call. 

## 2015-02-06 ENCOUNTER — Encounter: Payer: Self-pay | Admitting: *Deleted

## 2015-02-06 NOTE — Progress Notes (Signed)
EPIC Encounter for ICM Monitoring  Patient Name: Grace Robinson is a 81 y.o. female Date: 02/06/2015 Primary Care Physican: Precious Reel, MD Primary Cardiologist: Hochrein/ Truitt Merle, NP Electrophysiologist: Faustino Congress Weight: 134 lbs       In the past month, have you:  1. Gained more than 2 pounds in a day or more than 5 pounds in a week? no  2. Had changes in your medications (with verification of current medications)? no  3. Had more shortness of breath than is usual for you? no  4. Limited your activity because of shortness of breath? no  5. Not been able to sleep because of shortness of breath? no  6. Had increased swelling in your feet or ankles? no  7. Had symptoms of dehydration (dizziness, dry mouth, increased thirst, decreased urine output) no  8. Had changes in sodium restriction? no  9. Been compliant with medication? Yes   ICM trend:   Follow-up plan: ICM clinic phone appointment : 03/10/15. The patient is doing well at this time. No changes made today.   Copy of note sent to patient's primary care physician, primary cardiologist, and device following physician.  Alvis Lemmings, RN, BSN 02/06/2015 5:40 PM

## 2015-02-06 NOTE — Telephone Encounter (Signed)
I spoke with the patient. 

## 2015-02-18 ENCOUNTER — Ambulatory Visit: Payer: Medicare Other | Admitting: Nurse Practitioner

## 2015-03-10 ENCOUNTER — Encounter: Payer: Self-pay | Admitting: Internal Medicine

## 2015-03-10 ENCOUNTER — Ambulatory Visit (INDEPENDENT_AMBULATORY_CARE_PROVIDER_SITE_OTHER): Payer: Medicare Other | Admitting: *Deleted

## 2015-03-10 DIAGNOSIS — I429 Cardiomyopathy, unspecified: Secondary | ICD-10-CM

## 2015-03-10 DIAGNOSIS — Z95810 Presence of automatic (implantable) cardiac defibrillator: Secondary | ICD-10-CM

## 2015-03-10 DIAGNOSIS — I428 Other cardiomyopathies: Secondary | ICD-10-CM

## 2015-03-10 NOTE — Progress Notes (Signed)
Remote ICD transmission.   

## 2015-03-12 ENCOUNTER — Encounter: Payer: Self-pay | Admitting: *Deleted

## 2015-03-12 LAB — CUP PACEART REMOTE DEVICE CHECK
Battery Remaining Longevity: 96 mo
Battery Voltage: 3 V
Brady Statistic AP VP Percent: 29.91 %
Brady Statistic RA Percent Paced: 30.27 %
Brady Statistic RV Percent Paced: 0.23 %
HighPow Impedance: 54 Ohm
HighPow Impedance: 73 Ohm
Lead Channel Impedance Value: 4047 Ohm
Lead Channel Impedance Value: 4047 Ohm
Lead Channel Impedance Value: 456 Ohm
Lead Channel Impedance Value: 475 Ohm
Lead Channel Impedance Value: 513 Ohm
Lead Channel Pacing Threshold Amplitude: 0.75 V
Lead Channel Pacing Threshold Pulse Width: 0.4 ms
Lead Channel Sensing Intrinsic Amplitude: 30.125 mV
Lead Channel Setting Pacing Amplitude: 2 V
Lead Channel Setting Pacing Amplitude: 2.5 V
Lead Channel Setting Pacing Pulse Width: 0.4 ms
Lead Channel Setting Pacing Pulse Width: 0.4 ms
Lead Channel Setting Sensing Sensitivity: 0.6 mV
MDC IDC MSMT LEADCHNL LV PACING THRESHOLD AMPLITUDE: 0.875 V
MDC IDC MSMT LEADCHNL LV PACING THRESHOLD PULSEWIDTH: 0.4 ms
MDC IDC MSMT LEADCHNL RA PACING THRESHOLD AMPLITUDE: 0.75 V
MDC IDC MSMT LEADCHNL RA PACING THRESHOLD PULSEWIDTH: 0.4 ms
MDC IDC MSMT LEADCHNL RA SENSING INTR AMPL: 1.375 mV
MDC IDC MSMT LEADCHNL RV IMPEDANCE VALUE: 361 Ohm
MDC IDC SESS DTM: 20160509175238
MDC IDC SET LEADCHNL RA PACING AMPLITUDE: 2 V
MDC IDC SET ZONE DETECTION INTERVAL: 300 ms
MDC IDC STAT BRADY AP VS PERCENT: 0.36 %
MDC IDC STAT BRADY AS VP PERCENT: 68.75 %
MDC IDC STAT BRADY AS VS PERCENT: 0.98 %
Zone Setting Detection Interval: 240 ms
Zone Setting Detection Interval: 350 ms
Zone Setting Detection Interval: 450 ms
Zone Setting Detection Interval: 460 ms

## 2015-03-12 NOTE — Addendum Note (Signed)
Addended by: Alvis Lemmings C on: 03/12/2015 12:52 PM   Modules accepted: Level of Service

## 2015-03-12 NOTE — Progress Notes (Signed)
EPIC Encounter for ICM Monitoring  Patient Name: Grace Robinson is a 81 y.o. female Date: 03/12/2015 Primary Care Physican: Precious Reel, MD Primary Cardiologist: Hochrein/ Truitt Merle, NP Electrophysiologist: Faustino Congress Weight: 134 lbs       In the past month, have you:  1. Gained more than 2 pounds in a day or more than 5 pounds in a week? Uncertain- she has not weighed recently.  2. Had changes in your medications (with verification of current medications)? no  3. Had more shortness of breath than is usual for you? no  4. Limited your activity because of shortness of breath? no  5. Not been able to sleep because of shortness of breath? no  6. Had increased swelling in your feet or ankles? no  7. Had symptoms of dehydration (dizziness, dry mouth, increased thirst, decreased urine output) no  8. Had changes in sodium restriction? no  9. Been compliant with medication? Yes   ICM trend:   Follow-up plan: ICM clinic phone appointment: 04/17/15 No changes made today. The patient states she will be leaving to fly to [place] tomorrow to see her 3rd grandchild receive their Masters degree!!  Copy of note sent to patient's primary care physician, primary cardiologist, and device following physician.  Alvis Lemmings, RN, BSN 03/12/2015 12:49 PM

## 2015-03-19 ENCOUNTER — Encounter: Payer: Self-pay | Admitting: Nurse Practitioner

## 2015-03-19 ENCOUNTER — Ambulatory Visit (INDEPENDENT_AMBULATORY_CARE_PROVIDER_SITE_OTHER): Payer: Medicare Other | Admitting: Nurse Practitioner

## 2015-03-19 VITALS — BP 180/100 | HR 87 | Ht 66.5 in | Wt 136.1 lb

## 2015-03-19 DIAGNOSIS — I10 Essential (primary) hypertension: Secondary | ICD-10-CM | POA: Diagnosis not present

## 2015-03-19 DIAGNOSIS — I428 Other cardiomyopathies: Secondary | ICD-10-CM

## 2015-03-19 DIAGNOSIS — Z95810 Presence of automatic (implantable) cardiac defibrillator: Secondary | ICD-10-CM

## 2015-03-19 DIAGNOSIS — I429 Cardiomyopathy, unspecified: Secondary | ICD-10-CM | POA: Diagnosis not present

## 2015-03-19 NOTE — Progress Notes (Signed)
CARDIOLOGY OFFICE NOTE  Date:  03/19/2015    Grace Robinson Date of Birth: 04/10/1926 Medical Record #[id number]  PCP:  Precious Reel, MD  Cardiologist:  [place]  Chief Complaint  Patient presents with  . FU for NICM    Seen for Dr. Caryl Comes & Hochrein    History of Present Illness: Grace Robinson is a 81 y.o. female who presents today for a follow up visit. Seen for Dr. Percival Spanish and Dr. Caryl Comes. She has an nonischemic CM with remote EF down to 25% that improved with aggressive medical therapy and CRT-D. Last generator change in 2014. Other issues include HLD, IDDM, ulcerative colitis, LBBB, Meniere's disease and advanced age. Last echo showed a normal EF in October of 2014.  She was last seen by Dr. Caryl Comes back in September of 2015 - BP was up and her Lasix was increased along with potassium. Was to follow up with PCP. Echo updated.   I last saw her in December - she was doing ok from our standpoint but having other medical issues and lots of family stress.   Comes back today. Here alone. She is doing well. Having more issues with her balance. She is not dizzy - just loses her balance. Trying to get into a study at [facility] for balance. No chest pain. Not short of breath. Weight stable. Has just traveled to see her youngest grand daughter graduate from [employer]. She has not taken her medicines today. She rushed to get here. BP has been much better at home.   Past Medical History  Diagnosis Date  . Ischemic cardiomyopathy   . Biventricular ICD (implantable cardiac defibrillator) in place   . Diabetes mellitus   . HTN (hypertension)   . Dyslipidemia   . Ulcerative colitis   . Meniere's disease   . LBBB (left bundle branch block)     chronic  . Colon polyps   . Cellulitis   . T wave oversensing 01/17/2014    Past Surgical History  Procedure Laterality Date  . Cardiac catheterization  08/2011  . Pacemaker insertion      Medtronic, remote- no.   . Abdominal  hysterectomy    . Tonsillectomy and adenoidectomy    . Ep study  08/22/08  . Abdominal hysterectomy    . Mastoid sugery      in the setting of Meniere's disease  . Pericardial effusion  12/2002  . Implantable cardioverter defibrillator generator change N/A 08/22/2013    Procedure: IMPLANTABLE CARDIOVERTER DEFIBRILLATOR GENERATOR CHANGE;  Surgeon: Deboraha Sprang, MD;  Location: [facility] CATH LAB;  Service: Cardiovascular;  Laterality: N/A;     Medications: Current Outpatient Prescriptions  Medication Sig Dispense Refill  . aspirin EC 81 MG tablet Take 1 tablet (81 mg total) by mouth daily.    Marland Kitchen atorvastatin (LIPITOR) 20 MG tablet Take 20 mg by mouth daily.      . Biotin 1 MG CAPS Take 1,000 mg by mouth daily.    . furosemide (LASIX) 20 MG tablet Alternate taking 20 mg one day, take 40 mg the next day - repeating this pattern. 45 tablet 6  . gemfibrozil (LOPID) 600 MG tablet Take 600 mg by mouth 2 (two) times daily before a meal.    . insulin lispro (HUMALOG) 100 UNIT/ML injection Inject 10 Units into the skin 2 (two) times daily.    Marland Kitchen KLOR-CON M10 10 MEQ tablet TAKE 1 TABLET (10 MEQ TOTAL) BY MOUTH 2 (TWO)  TIMES DAILY. 60 tablet 1  . losartan (COZAAR) 100 MG tablet Take 100 mg by mouth daily.    . metoprolol succinate (TOPROL-XL) 100 MG 24 hr tablet Take 1 tablet by mouth 2 (two) times daily.    . Multiple Vitamins-Minerals (CENTRUM SILVER ADULT 50+ PO) Take by mouth daily.    . naproxen sodium (ANAPROX) 220 MG tablet Take 220 mg by mouth 2 (two) times daily as needed (for knee pain).    Marland Kitchen sitaGLIPtin (JANUVIA) 100 MG tablet Take 100 mg by mouth daily.     No current facility-administered medications for this visit.    Allergies: Allergies  Allergen Reactions  . Carvedilol   . Sudafed [Pseudoephedrine]     Makes skin feel like its crawling  . Vancomycin     Social History: The patient  reports that she has quit smoking. She does not have any smokeless tobacco history on file. She  reports that she does not drink alcohol or use illicit drugs.   Family History: The patient's family history includes Cancer (age of onset: 12) in her mother; Emphysema (age of onset: 1) in her father.   Review of Systems: Please see the history of present illness.   Otherwise, the review of systems is positive for joint stiffness.   All other systems are reviewed and negative.   Physical Exam: VS:  BP 180/100 mmHg  Pulse 87  Ht 5' 6.5" (1.689 m)  Wt 136 lb 1.9 oz (61.744 kg)  BMI 21.64 kg/m2  SpO2 97% .  BMI Body mass index is 21.64 kg/(m^2).  Wt Readings from Last 3 Encounters:  03/19/15 136 lb 1.9 oz (61.744 kg)  10/02/14 136 lb 6.4 oz (61.871 kg)  07/09/14 137 lb 12.8 oz (62.506 kg)   Repeat BP is 160/90 by me.  General: Pleasant. She looks younger than her stated age. Well developed, well nourished and in no acute distress.  HEENT: Normal. Neck: Supple, no JVD, carotid bruits, or masses noted.  Cardiac: Regular rate and rhythm. No murmurs, rubs, or gallops. No edema.  Respiratory:  Lungs are clear to auscultation bilaterally with normal work of breathing.  GI: Soft and nontender.  MS: No deformity or atrophy. Gait and ROM intact. Skin: Warm and dry. Color is normal.  Neuro:  Strength and sensation are intact and no gross focal deficits noted.  Psych: Alert, appropriate and with normal affect.   LABORATORY DATA:  EKG:  EKG is not ordered today.  Lab Results  Component Value Date   WBC 4.3* 08/13/2013   HGB 12.5 08/13/2013   HCT 36.8 08/13/2013   PLT 150.0 08/13/2013   GLUCOSE 176* 02/21/2014   NA 140 02/21/2014   K 3.7 02/21/2014   CL 103 02/21/2014   CREATININE 0.9 02/21/2014   BUN 20 02/21/2014   CO2 29 02/21/2014   TSH 1.23 07/20/2011   INR 1.1* 08/13/2013    BNP (last 3 results) No results for input(s): BNP in the last 8760 hours.  ProBNP (last 3 results) No results for input(s): PROBNP in the last 8760 hours.   Other Studies Reviewed  Today:  Echo Study Conclusions from September 2015  - Left ventricle: The cavity size was normal. Systolic function was normal. The estimated ejection fraction was in the range of 50% to 55%. There was an increased relative contribution of atrial contraction to ventricular filling. Doppler parameters are consistent with abnormal left ventricular relaxation (grade 1 diastolic dysfunction). - Ventricular septum: Septal motion showed paradox. These  changes are consistent with right ventricular pacing. - Aortic valve: There was mild regurgitation. - Mitral valve: There is a calcified mobile density in the LV cavity most consistent with calcified redundant chordae tendinae. This was present on echo in 2014. There was mild regurgitation. - Pulmonic valve: There was trivial regurgitation.  Assessment / Plan: 1. NICM - with normalization of her EF - 50 to 55% by echo from September of 2015 - her cardiac status is ok. She is doing well clinically.   2. Underlying CRT-D - followed by Dr. Caryl Comes - will see her in September  3. HTN - BP is up - she has had better control as an outpatient. She has not had her medicines today - she will continue to monitor at home for me.   4. Significant stress - discussed previously.  Current medicines are reviewed with the patient today.  The patient does not have concerns regarding medicines other than what has been noted above.  The following changes have been made:  See above.  Labs/ tests ordered today include:   No orders of the defined types were placed in this encounter.     Disposition:   FU with me in 6 months.   Patient is agreeable to this plan and will call if any problems develop in the interim.   Signed: Burtis Junes, RN, ANP-C 03/19/2015 9:26 AM  [facility] [address] [place], [place]  [address] Phone: [phone] Fax: [phone]

## 2015-03-19 NOTE — Patient Instructions (Addendum)
We will be checking the following labs today - NONE   Medication Instructions:    Continue with your current medicines.     Testing/Procedures To Be Arranged:  N/A  Follow-Up:   I will see you back in 6 months  Needs recall with Dr. Caryl Comes for September    Other Special Instructions:   N/A  Call the [place] office at [phone] if you have any questions, problems or concerns.

## 2015-03-24 ENCOUNTER — Encounter: Payer: Self-pay | Admitting: Cardiology

## 2015-03-28 ENCOUNTER — Other Ambulatory Visit: Payer: Self-pay | Admitting: *Deleted

## 2015-03-28 ENCOUNTER — Telehealth: Payer: Self-pay | Admitting: Nurse Practitioner

## 2015-03-28 MED ORDER — AMLODIPINE BESYLATE 2.5 MG PO TABS: 2.5000 mg | ORAL_TABLET | Freq: Every day | ORAL | Status: DC

## 2015-03-28 NOTE — Telephone Encounter (Signed)
I would like to see BP a little lower.   Would she add Norvasc 2.5 mg each day?  Continue to monitor.

## 2015-03-28 NOTE — Telephone Encounter (Signed)
New message    Patient calling was advise call back regarding her blood pressure x 3 days.     Patient feels her blood pressure is fine    Pt c/o BP issue: STAT if pt c/o blurred vision, one-sided weakness or slurred speech  1. What are your last 5 BP readings? 5/26 167/82 , 5/25 169/83,  5/24 171/80   2. Are you having any other symptoms (ex. Dizziness, headache, blurred vision, passed out)? No   3. What is your BP issue? Doing fine.

## 2015-03-28 NOTE — Telephone Encounter (Signed)
S/w pt is agreeable to plan will start Norvasc ( 2.5 mg ) daily sent in today to pharmacy requested by pt.  [facility] [address].

## 2015-04-11 ENCOUNTER — Other Ambulatory Visit: Payer: Self-pay | Admitting: Cardiology

## 2015-04-17 ENCOUNTER — Encounter: Payer: Self-pay | Admitting: *Deleted

## 2015-04-17 ENCOUNTER — Ambulatory Visit (INDEPENDENT_AMBULATORY_CARE_PROVIDER_SITE_OTHER): Payer: Medicare Other | Admitting: *Deleted

## 2015-04-17 DIAGNOSIS — Z95810 Presence of automatic (implantable) cardiac defibrillator: Secondary | ICD-10-CM | POA: Diagnosis not present

## 2015-04-17 DIAGNOSIS — I420 Dilated cardiomyopathy: Secondary | ICD-10-CM

## 2015-04-17 NOTE — Progress Notes (Signed)
EPIC Encounter for ICM Monitoring  Patient Name: Grace Robinson is a 81 y.o. female Date: 04/17/2015 Primary Care Physican: Precious Reel, MD Primary Cardiologist: Hochrein/ Truitt Merle, NP Electrophysiologist: Faustino Congress Weight: 134 lbs       In the past month, have you:  1. Gained more than 2 pounds in a day or more than 5 pounds in a week? no  2. Had changes in your medications (with verification of current medications)? no  3. Had more shortness of breath than is usual for you? no  4. Limited your activity because of shortness of breath? no  5. Not been able to sleep because of shortness of breath? no  6. Had increased swelling in your feet or ankles? no  7. Had symptoms of dehydration (dizziness, dry mouth, increased thirst, decreased urine output) no  8. Had changes in sodium restriction? no  9. Been compliant with medication? Yes   ICM trend:   Follow-up plan: ICM clinic phone appointment: 05/22/15. The patient is doing well from a cardiac standpoint. She is currently taking lasix 20 mg once every other day alternating with 40 mg once every other day. Optivol readings slightly above baseline- with high heat today, I have advised her to take lasix 20 mg tomorrow (was due for 40 mg), 20 mg on Saturday, and resume normal dosing on Sunday. She verbalizes understanding. I have also advised her to stay hydrated with the hot temperatures.   Copy of note sent to patient's primary care physician, primary cardiologist, and device following physician.  Alvis Lemmings, RN, BSN 04/17/2015 1:48 PM

## 2015-05-04 ENCOUNTER — Encounter (HOSPITAL_COMMUNITY): Payer: Self-pay | Admitting: Emergency Medicine

## 2015-05-04 ENCOUNTER — Emergency Department (HOSPITAL_COMMUNITY)
Admission: EM | Admit: 2015-05-04 | Discharge: 2015-05-04 | Disposition: A | Discharge status: Home / Self Care | Payer: Medicare Other | Attending: Emergency Medicine | Admitting: Emergency Medicine

## 2015-05-04 DIAGNOSIS — I8391 Asymptomatic varicose veins of right lower extremity: Secondary | ICD-10-CM

## 2015-05-04 DIAGNOSIS — Z9889 Other specified postprocedural states: Secondary | ICD-10-CM | POA: Diagnosis not present

## 2015-05-04 DIAGNOSIS — Z86010 Personal history of colonic polyps: Secondary | ICD-10-CM | POA: Diagnosis not present

## 2015-05-04 DIAGNOSIS — Z95810 Presence of automatic (implantable) cardiac defibrillator: Secondary | ICD-10-CM | POA: Insufficient documentation

## 2015-05-04 DIAGNOSIS — Z794 Long term (current) use of insulin: Secondary | ICD-10-CM | POA: Diagnosis not present

## 2015-05-04 DIAGNOSIS — Z872 Personal history of diseases of the skin and subcutaneous tissue: Secondary | ICD-10-CM | POA: Insufficient documentation

## 2015-05-04 DIAGNOSIS — E785 Hyperlipidemia, unspecified: Secondary | ICD-10-CM | POA: Insufficient documentation

## 2015-05-04 DIAGNOSIS — I10 Essential (primary) hypertension: Secondary | ICD-10-CM | POA: Diagnosis not present

## 2015-05-04 DIAGNOSIS — D689 Coagulation defect, unspecified: Secondary | ICD-10-CM | POA: Diagnosis present

## 2015-05-04 DIAGNOSIS — Z87891 Personal history of nicotine dependence: Secondary | ICD-10-CM | POA: Insufficient documentation

## 2015-05-04 DIAGNOSIS — Z79899 Other long term (current) drug therapy: Secondary | ICD-10-CM | POA: Insufficient documentation

## 2015-05-04 DIAGNOSIS — E119 Type 2 diabetes mellitus without complications: Secondary | ICD-10-CM | POA: Insufficient documentation

## 2015-05-04 DIAGNOSIS — Z7982 Long term (current) use of aspirin: Secondary | ICD-10-CM | POA: Insufficient documentation

## 2015-05-04 DIAGNOSIS — Z8669 Personal history of other diseases of the nervous system and sense organs: Secondary | ICD-10-CM | POA: Insufficient documentation

## 2015-05-04 DIAGNOSIS — Z8719 Personal history of other diseases of the digestive system: Secondary | ICD-10-CM | POA: Diagnosis not present

## 2015-05-04 LAB — I-STAT CHEM 8, ED
BUN: 18 mg/dL (ref 6–20)
CALCIUM ION: 1.08 mmol/L — AB (ref 1.13–1.30)
CHLORIDE: 100 mmol/L — AB (ref 101–111)
CREATININE: 0.9 mg/dL (ref 0.44–1.00)
Glucose, Bld: 388 mg/dL — ABNORMAL HIGH (ref 65–99)
HCT: 32 % — ABNORMAL LOW (ref 36.0–46.0)
Hemoglobin: 10.9 g/dL — ABNORMAL LOW (ref 12.0–15.0)
Potassium: 3.7 mmol/L (ref 3.5–5.1)
Sodium: 138 mmol/L (ref 135–145)
TCO2: 21 mmol/L (ref 0–100)

## 2015-05-04 LAB — CBC
HCT: 31.7 % — ABNORMAL LOW (ref 36.0–46.0)
HEMOGLOBIN: 10.8 g/dL — AB (ref 12.0–15.0)
MCH: 31 pg (ref 26.0–34.0)
MCHC: 34.1 g/dL (ref 30.0–36.0)
MCV: 91.1 fL (ref 78.0–100.0)
Platelets: 149 10*3/uL — ABNORMAL LOW (ref 150–400)
RBC: 3.48 MIL/uL — ABNORMAL LOW (ref 3.87–5.11)
RDW: 13.3 % (ref 11.5–15.5)
WBC: 5.2 10*3/uL (ref 4.0–10.5)

## 2015-05-04 MED ORDER — LIDOCAINE-EPINEPHRINE 2 %-1:100000 IJ SOLN
20.0000 mL | Freq: Once | INTRAMUSCULAR | Status: DC
  Filled 2015-05-04: qty 20

## 2015-05-04 MED ORDER — SODIUM CHLORIDE 0.9 % IV BOLUS (SEPSIS)
1000.0000 mL | Freq: Once | INTRAVENOUS | Status: AC
  Administered 2015-05-04: 1000 mL via INTRAVENOUS

## 2015-05-04 MED ORDER — LIDOCAINE-EPINEPHRINE (PF) 2 %-1:200000 IJ SOLN
20.0000 mL | Freq: Once | INTRAMUSCULAR | Status: AC
  Administered 2015-05-04: 20 mL via INTRADERMAL
  Filled 2015-05-04: qty 20

## 2015-05-04 NOTE — ED Notes (Signed)
Noticed bleeding from right anterior leg varicose vein that began approx 0900 today.  Bleeding currently controlled by pressure dressing.

## 2015-05-04 NOTE — ED Provider Notes (Signed)
CSN: [id number]     Arrival date & time 05/04/15  1245 History   First MD Initiated Contact with Patient 05/04/15 1246     No chief complaint on file.    (Consider location/radiation/quality/duration/timing/severity/associated sxs/prior Treatment) [name] presenting with general illness.  Illness Location:  R ankle Quality:  Bleeding Severity:  Moderate Onset quality:  Gradual Duration:  1 hour Timing:  Constant Progression:  Unchanged Chronicity:  New Context:  Spontaneously had varicose vein on her R ankle pop Relieved by:  Nothing Worsened by:  Nothing Associated symptoms: no cough, no diarrhea, no fever and no shortness of breath     Past Medical History  Diagnosis Date  . Ischemic cardiomyopathy   . Biventricular ICD (implantable cardiac defibrillator) in place   . Diabetes mellitus   . HTN (hypertension)   . Dyslipidemia   . Ulcerative colitis   . Meniere's disease   . LBBB (left bundle branch block)     chronic  . Colon polyps   . Cellulitis   . T wave oversensing 01/17/2014   Past Surgical History  Procedure Laterality Date  . Cardiac catheterization  08/2011  . Pacemaker insertion      Medtronic, remote- no.   . Abdominal hysterectomy    . Tonsillectomy and adenoidectomy    . Ep study  08/22/08  . Abdominal hysterectomy    . Mastoid sugery      in the setting of Meniere's disease  . Pericardial effusion  12/2002  . Implantable cardioverter defibrillator generator change N/A 08/22/2013    Procedure: IMPLANTABLE CARDIOVERTER DEFIBRILLATOR GENERATOR CHANGE;  Surgeon: Deboraha Sprang, MD;  Location: [facility] CATH LAB;  Service: Cardiovascular;  Laterality: N/A;   Family History  Problem Relation Age of Onset  . Emphysema Father 57  . Cancer Mother 28   History  Substance Use Topics  . Smoking status: Former Research scientist (life sciences)  . Smokeless tobacco: Not on file     Comment: quit in 1970  . Alcohol Use: No   OB History    No data available      Review of Systems  Constitutional: Negative for fever and chills.  Respiratory: Negative for cough and shortness of breath.   Gastrointestinal: Negative for diarrhea.  All other systems reviewed and are negative.     Allergies  Carvedilol; Sudafed; and Vancomycin  Home Medications   Prior to Admission medications   Medication Sig Start Date End Date Taking? Authorizing Provider  amLODipine (NORVASC) 2.5 MG tablet Take 1 tablet (2.5 mg total) by mouth daily. 03/28/15   Burtis Junes, NP  aspirin EC 81 MG tablet Take 1 tablet (81 mg total) by mouth daily. 07/20/11   Deboraha Sprang, MD  atorvastatin (LIPITOR) 20 MG tablet Take 20 mg by mouth daily.      Historical Provider, MD  Biotin 1 MG CAPS Take 1,000 mg by mouth daily.    Historical Provider, MD  furosemide (LASIX) 20 MG tablet Alternate taking 20 mg one day, take 40 mg the next day - repeating this pattern. 01/17/14   Deboraha Sprang, MD  gemfibrozil (LOPID) 600 MG tablet Take 600 mg by mouth 2 (two) times daily before a meal.    Historical Provider, MD  insulin lispro (HUMALOG) 100 UNIT/ML injection Inject 10 Units into the skin 2 (two) times daily.    Historical Provider, MD  KLOR-CON M10 10 MEQ tablet TAKE 1 TABLET (10 MEQ TOTAL) BY MOUTH 2 (TWO)  TIMES DAILY. 04/11/15   Minus Breeding, MD  losartan (COZAAR) 100 MG tablet Take 100 mg by mouth daily.    Historical Provider, MD  metoprolol succinate (TOPROL-XL) 100 MG 24 hr tablet Take 1 tablet by mouth 2 (two) times daily. 07/04/13   Historical Provider, MD  Multiple Vitamins-Minerals (CENTRUM SILVER ADULT 50+ PO) Take by mouth daily.    Historical Provider, MD  naproxen sodium (ANAPROX) 220 MG tablet Take 220 mg by mouth 2 (two) times daily as needed (for knee pain).    Historical Provider, MD  sitaGLIPtin (JANUVIA) 100 MG tablet Take 100 mg by mouth daily.    Historical Provider, MD   There were no vitals taken for this visit. Physical Exam  Constitutional: She is oriented to  person, place, and time. She appears well-developed and well-nourished. No distress.  HENT:  Head: Normocephalic and atraumatic.  Mouth/Throat: Oropharynx is clear and moist.  Eyes: EOM are normal. Pupils are equal, round, and reactive to light.  Neck: Normal range of motion. Neck supple.  Cardiovascular: Normal rate and regular rhythm.  Exam reveals no friction rub.   No murmur heard. Pulmonary/Chest: Effort normal and breath sounds normal. No respiratory distress. She has no wheezes. She has no rales.  Abdominal: Soft. She exhibits no distension. There is no tenderness. There is no rebound.  Musculoskeletal: Normal range of motion. She exhibits no edema.       Feet:  Neurological: She is alert and oriented to person, place, and time.  Skin: She is not diaphoretic.  Nursing note and vitals reviewed.   ED Course  Wound repair Date/Time: 05/04/2015 4:17 PM Performed by: Evelina Bucy Authorized by: Evelina Bucy Consent: Verbal consent obtained. Comments: R lower leg with bleeding varicose vein.  IV tourniquet placed proximal to wound 2 cc of 1% lidocaine with epi applied to wound 1 3-0 ethilon suture placed - figure of 8 stitch   (including critical care time) Labs Review Labs Reviewed - No data to display  Imaging Review No results found.   EKG Interpretation None      MDM   Final diagnoses:  Varicose veins of right lower extremity    An 81 year-old female with history of CHF not on blood thinners presents with a varicose vein bleeding from her right anterior ankle. She was walking across the floor and then began to feel wetness. She did not have any trauma. It was wrapped hemostatic provided by fire services here. She has good pulses distally. Will anesthetize and put in a figure-of-eight suture. She's stating she is mildly dizzy. She is fairly anxious about the whole situation. We'll check a hemoglobin. No CP, SOB, N/V. Hgb 10.9, lower than 2 years ago. Doesn't  require transfusion today. BP mildly low, given fluids, no further dizziness, just feeling tired. Stable for discharge.      Evelina Bucy, MD 05/04/15 [phone]

## 2015-05-04 NOTE — ED Notes (Signed)
Pt blood spurting from right anterior foot medially once dressing removed.  Direct pressure held by physician, then EDT.  Dr Mingo Amber placed suture to stop venous bleeding.  Distal pulse present following procedure.

## 2015-05-04 NOTE — Discharge Instructions (Signed)
Varicose Veins Varicose veins are veins that have become enlarged and twisted. CAUSES This condition is the result of valves in the veins not working properly. Valves in the veins help return blood from the leg to the heart. If these valves are damaged, blood flows backwards and backs up into the veins in the leg near the skin. This causes the veins to become larger. People who are on their feet a lot, who are pregnant, or who are overweight are more likely to develop varicose veins. SYMPTOMS   Bulging, twisted-appearing, bluish veins, most commonly found on the legs.  Leg pain or a feeling of heaviness. These symptoms may be worse at the end of the day.  Leg swelling.  Skin color changes. DIAGNOSIS  Varicose veins can usually be diagnosed with an exam of your legs by your caregiver. He or she may recommend an ultrasound of your leg veins. TREATMENT  Most varicose veins can be treated at home.However, other treatments are available for people who have persistent symptoms or who want to treat the cosmetic appearance of the varicose veins. These include:  Laser treatment of very small varicose veins.  Medicine that is shot (injected) into the vein. This medicine hardens the walls of the vein and closes off the vein. This treatment is called sclerotherapy. Afterwards, you may need to wear clothing or bandages that apply pressure.  Surgery. HOME CARE INSTRUCTIONS   Do not stand or sit in one position for long periods of time. Do not sit with your legs crossed. Rest with your legs raised during the day.  Wear elastic stockings or support hose. Do not wear other tight, encircling garments around the legs, pelvis, or waist.  Walk as much as possible to increase blood flow.  Raise the foot of your bed at night with 2-inch blocks.  If you get a cut in the skin over the vein and the vein bleeds, lie down with your leg raised and press on it with a clean cloth until the bleeding stops. Then  place a bandage (dressing) on the cut. See your caregiver if it continues to bleed or needs stitches. SEEK MEDICAL CARE IF:   The skin around your ankle starts to break down.  You have pain, redness, tenderness, or hard swelling developing in your leg over a vein.  You are uncomfortable due to leg pain. Document Released: 07/28/2005 Document Revised: 01/10/2012 Document Reviewed: 12/14/2010 ExitCare Patient Information 2015 [employer]. This information is not intended to replace advice given to you by your health care provider. Make sure you discuss any questions you have with your health care provider.  

## 2015-05-04 NOTE — ED Notes (Signed)
Pt ambulated in hall without any problems 

## 2015-05-22 ENCOUNTER — Encounter: Payer: Medicare Other | Admitting: *Deleted

## 2015-05-23 ENCOUNTER — Telehealth: Payer: Self-pay | Admitting: *Deleted

## 2015-05-23 NOTE — Telephone Encounter (Signed)
ICM transmission received. I left a message for the patient to call. 

## 2015-07-10 ENCOUNTER — Encounter: Payer: Self-pay | Admitting: Internal Medicine

## 2015-07-10 ENCOUNTER — Ambulatory Visit (INDEPENDENT_AMBULATORY_CARE_PROVIDER_SITE_OTHER): Payer: Medicare Other | Admitting: Internal Medicine

## 2015-07-10 VITALS — BP 138/86 | HR 81 | Ht 66.5 in | Wt 139.4 lb

## 2015-07-10 DIAGNOSIS — I509 Heart failure, unspecified: Secondary | ICD-10-CM

## 2015-07-10 DIAGNOSIS — I472 Ventricular tachycardia: Secondary | ICD-10-CM

## 2015-07-10 DIAGNOSIS — Z95810 Presence of automatic (implantable) cardiac defibrillator: Secondary | ICD-10-CM

## 2015-07-10 DIAGNOSIS — I420 Dilated cardiomyopathy: Secondary | ICD-10-CM

## 2015-07-10 DIAGNOSIS — I4729 Other ventricular tachycardia: Secondary | ICD-10-CM

## 2015-07-10 NOTE — Patient Instructions (Signed)
Medication Instructions: - no changes  Labwork: - none  Procedures/Testing: - none  Follow-Up: - Remote monitoring is used to monitor your Pacemaker of ICD from home. This monitoring reduces the number of office visits required to check your device to one time per year. It allows [place] to keep an eye on the functioning of your device to ensure it is working properly. You are scheduled for a device check from home on 08/11/15 (fluid check). You may send your transmission at any time that day. If you have a wireless device, the transmission will be sent automatically. After your physician reviews your transmission, you will receive a postcard with your next transmission date.  - Your physician wants you to follow-up in: 1 year with Dr. Caryl Comes. You will receive a reminder letter in the mail two months in advance. If you don't receive a letter, please call our office to schedule the follow-up appointment.  Any Additional Special Instructions Will Be Listed Below (If Applicable). - none

## 2015-07-10 NOTE — Progress Notes (Signed)
Patient Care Team: Shon Baton, MD as PCP - General (Internal Medicine)   HPI  Grace Robinson is a 81 y.o. female is seen in followup for nonischemic cardiomyopathy complicated by congestive heart failure. She is status post CRT-D implantation was further compromised by a 6949-lead which failed prompting [place] to pirate her previously implanted pacemaker lead . This was done in 2007  There have been intercurrent normalization of LV functionwith an echo 2014 demonstrating near normal left ventricular function and mild AV valvular regurgitation  this was confirmed 9/15;  She underwent generator replacement 0ct 2014; she's had some tingling over the incision which is somewhat better but still persists. There has been no erythema.   The patient denies chest pain, There have been no palpitations, lightheadedness or syncope  She has had 2 interval problems with bleeding 1 from a venous anomaly in her leg and the other in her ear.      Past Medical History  Diagnosis Date  . Ischemic cardiomyopathy   . Biventricular ICD (implantable cardiac defibrillator) in place   . Diabetes mellitus   . HTN (hypertension)   . Dyslipidemia   . Ulcerative colitis   . Meniere's disease   . LBBB (left bundle branch block)     chronic  . Colon polyps   . Cellulitis   . T wave oversensing 01/17/2014    Past Surgical History  Procedure Laterality Date  . Cardiac catheterization  08/2011  . Pacemaker insertion      Medtronic, remote- no.   . Abdominal hysterectomy    . Tonsillectomy and adenoidectomy    . Ep study  08/22/08  . Abdominal hysterectomy    . Mastoid sugery      in the setting of Meniere's disease  . Pericardial effusion  12/2002  . Implantable cardioverter defibrillator generator change N/A 08/22/2013    Procedure: IMPLANTABLE CARDIOVERTER DEFIBRILLATOR GENERATOR CHANGE;  Surgeon: Deboraha Sprang, MD;  Location: [facility] CATH LAB;  Service: Cardiovascular;  Laterality: N/A;  .  Cholecystectomy      Current Outpatient Prescriptions  Medication Sig Dispense Refill  . amLODipine (NORVASC) 2.5 MG tablet Take 1 tablet (2.5 mg total) by mouth daily. 30 tablet 6  . aspirin EC 81 MG tablet Take 1 tablet (81 mg total) by mouth daily.    Marland Kitchen atorvastatin (LIPITOR) 40 MG tablet Take 40 mg by mouth daily.  3  . Biotin 1 MG CAPS Take 1,000 mg by mouth daily.    . furosemide (LASIX) 20 MG tablet Alternate taking 20 mg one day, take 40 mg the next day - repeating this pattern. 45 tablet 6  . HUMALOG MIX 75/25 (75-25) 100 UNIT/ML SUSP injection Inject 10 Units into the skin 2 (two) times daily.  1  . KLOR-CON M10 10 MEQ tablet TAKE 1 TABLET (10 MEQ TOTAL) BY MOUTH 2 (TWO) TIMES DAILY. 60 tablet 3  . losartan (COZAAR) 100 MG tablet Take 100 mg by mouth daily.    . metoprolol succinate (TOPROL-XL) 100 MG 24 hr tablet Take 1 tablet by mouth 2 (two) times daily.    . Multiple Vitamins-Minerals (CENTRUM SILVER ADULT 50+ PO) Take by mouth daily.    . naproxen sodium (ANAPROX) 220 MG tablet Take 220 mg by mouth 2 (two) times daily as needed (for knee pain).    Marland Kitchen sitaGLIPtin (JANUVIA) 100 MG tablet Take 100 mg by mouth daily.     No current facility-administered medications for this visit.  Allergies  Allergen Reactions  . Carvedilol   . Sudafed [Pseudoephedrine]     Makes skin feel like its crawling  . Vancomycin     Review of Systems negative except from HPI and PMH  Physical Exam BP 138/86 mmHg  Pulse 81  Ht 5' 6.5" (1.689 m)  Wt 139 lb 6.4 oz (63.231 kg)  BMI 22.17 kg/m2 Well developed and well nourished in no acute distress HENT normal E scleral and icterus clear Neck Supple JVP flat; carotids brisk and full Clear to ausculation  Regular rate and rhythm, no murmurs gallops or rub Soft with active bowel sounds No clubbing cyanosis   Edema Alert and oriented, grossly normal motor and sensory function Skin Warm and Dry  ECG demonstrates AV pacing  Assessment  and  Plan  Nonischemic cardiomyopathy -resolved  Congestive heart failure ? HFpEF  CRT-D-Medtronic  hyperension   She has done exceedingly well. We'll continue her on her current medications.  Blood pressure is much better.  Euvolemic continue current meds

## 2015-07-14 LAB — CUP PACEART INCLINIC DEVICE CHECK
Battery Remaining Longevity: 92 mo
Battery Voltage: 3 V
Brady Statistic AP VP Percent: 23.99 %
Brady Statistic AS VP Percent: 74.63 %
Brady Statistic AS VS Percent: 1.07 %
Brady Statistic RA Percent Paced: 24.3 %
Date Time Interrogation Session: 20160908144703
HIGH POWER IMPEDANCE MEASURED VALUE: 72 Ohm
HighPow Impedance: 190 Ohm
HighPow Impedance: 53 Ohm
Lead Channel Impedance Value: 4047 Ohm
Lead Channel Impedance Value: 4047 Ohm
Lead Channel Pacing Threshold Amplitude: 0.75 V
Lead Channel Pacing Threshold Pulse Width: 0.4 ms
Lead Channel Sensing Intrinsic Amplitude: 1.625 mV
Lead Channel Setting Pacing Amplitude: 2 V
Lead Channel Setting Pacing Amplitude: 2.5 V
Lead Channel Setting Pacing Pulse Width: 0.4 ms
Lead Channel Setting Pacing Pulse Width: 0.4 ms
MDC IDC MSMT LEADCHNL LV IMPEDANCE VALUE: 513 Ohm
MDC IDC MSMT LEADCHNL LV PACING THRESHOLD AMPLITUDE: 1 V
MDC IDC MSMT LEADCHNL LV PACING THRESHOLD PULSEWIDTH: 0.4 ms
MDC IDC MSMT LEADCHNL RA IMPEDANCE VALUE: 475 Ohm
MDC IDC MSMT LEADCHNL RA PACING THRESHOLD PULSEWIDTH: 0.4 ms
MDC IDC MSMT LEADCHNL RV IMPEDANCE VALUE: 456 Ohm
MDC IDC MSMT LEADCHNL RV PACING THRESHOLD AMPLITUDE: 1 V
MDC IDC MSMT LEADCHNL RV SENSING INTR AMPL: 31.625 mV
MDC IDC SET LEADCHNL LV PACING AMPLITUDE: 2 V
MDC IDC SET LEADCHNL RV SENSING SENSITIVITY: 0.6 mV
MDC IDC SET ZONE DETECTION INTERVAL: 300 ms
MDC IDC SET ZONE DETECTION INTERVAL: 350 ms
MDC IDC SET ZONE DETECTION INTERVAL: 450 ms
MDC IDC SET ZONE DETECTION INTERVAL: 460 ms
MDC IDC STAT BRADY AP VS PERCENT: 0.31 %
MDC IDC STAT BRADY RV PERCENT PACED: 0.6 %
Zone Setting Detection Interval: 240 ms

## 2015-08-11 ENCOUNTER — Ambulatory Visit (INDEPENDENT_AMBULATORY_CARE_PROVIDER_SITE_OTHER): Payer: Medicare Other

## 2015-08-11 ENCOUNTER — Telehealth: Payer: Self-pay | Admitting: Cardiology

## 2015-08-11 DIAGNOSIS — Z95810 Presence of automatic (implantable) cardiac defibrillator: Secondary | ICD-10-CM | POA: Diagnosis not present

## 2015-08-11 NOTE — Telephone Encounter (Signed)
LMOVM reminding pt to send remote transmission.   

## 2015-08-12 NOTE — Progress Notes (Signed)
EPIC Encounter for ICM Monitoring  Patient Name: Grace Robinson is a 81 y.o. female Date: 08/12/2015 Primary Care Physican: Precious Reel, MD Primary Cardiologist: Hilbert Corrigan, NP Electrophysiologist: Faustino Congress Weight: Does not weigh at home.        In the past month, have you:  1. Gained more than 2 pounds in a day or more than 5 pounds in a week? no  2. Had changes in your medications (with verification of current medications)? no  3. Had more shortness of breath than is usual for you? no  4. Limited your activity because of shortness of breath? no  5. Not been able to sleep because of shortness of breath? no  6. Had increased swelling in your feet or ankles? no  7. Had symptoms of dehydration (dizziness, dry mouth, increased thirst, decreased urine output) no  8. Had changes in sodium restriction? no  9. Been compliant with medication? Yes   ICM trend:   Follow-up plan: ICM clinic phone appointment on 09/12/2015.  Patient has appointment with Truitt Merle on 09/02/2015.  Optivol impedance below baseline starting~ 08/05/2015 and patient stated she was on vacation for the last week.  She stated some days she did not take Lasix due to difficulty finding a bathroom [place].  She also ate out in restaurants more than normal and explained the sodium of restaurant foods are higher.  She arrived home from vacation yesterday and is getting back on track taking meds and eating better.  She is asymptomatic.  Education given to take meds as prescribed to decrease risk of fluid retention and weigh daily.  No changes today.    Copy of note sent to patient's primary care physician, primary cardiologist, and device following physician.  [name], RN, CCM 08/12/2015 8:57 AM

## 2015-08-24 ENCOUNTER — Other Ambulatory Visit: Payer: Self-pay | Admitting: Cardiology

## 2015-09-02 ENCOUNTER — Ambulatory Visit: Payer: Medicare Other | Admitting: Nurse Practitioner

## 2015-09-12 ENCOUNTER — Ambulatory Visit (INDEPENDENT_AMBULATORY_CARE_PROVIDER_SITE_OTHER): Payer: Medicare Other

## 2015-09-12 ENCOUNTER — Telehealth: Payer: Self-pay

## 2015-09-12 DIAGNOSIS — I255 Ischemic cardiomyopathy: Secondary | ICD-10-CM

## 2015-09-12 DIAGNOSIS — Z95810 Presence of automatic (implantable) cardiac defibrillator: Secondary | ICD-10-CM | POA: Diagnosis not present

## 2015-09-12 NOTE — Telephone Encounter (Signed)
ICM transmission received.  Attempted patient call and left message for return call.  

## 2015-09-16 NOTE — Progress Notes (Signed)
EPIC Encounter for ICM Monitoring  Patient Name: Grace Robinson is a 81 y.o. female Date: 09/16/2015 Primary Care Physican: Precious Reel, MD Primary Cardiologist: Hilbert Corrigan, NP Electrophysiologist: Faustino Congress Weight: Does not weigh at home       Bi-V Pacing 97.9%  In the past month, have you:  1. Gained more than 2 pounds in a day or more than 5 pounds in a week? no  2. Had changes in your medications (with verification of current medications)? no  3. Had more shortness of breath than is usual for you? no  4. Limited your activity because of shortness of breath? no  5. Not been able to sleep because of shortness of breath? no  6. Had increased swelling in your feet or ankles? no  7. Had symptoms of dehydration (dizziness, dry mouth, increased thirst, decreased urine output) no  8. Had changes in sodium restriction? no  9. Been compliant with medication? Yes   ICM trend:  09/12/2015   Follow-up plan: ICM clinic phone appointment 10/20/2015. Appointment with Truitt Merle, NP on 09/23/2015.   Optivol thoracic impedance trending along baseline.  She denied any HF symptoms and doing well.  She will be traveling to [place] around 10/23/2015 to visit granddaughter for Christmas.  Encouraged her to call the office is she experiences any HF symptoms.    Copy of note sent to patient's primary care physician, primary cardiologist, and device following physician.  [name], RN, CCM 09/16/2015 8:54 AM

## 2015-09-18 NOTE — Telephone Encounter (Signed)
Spoke with patient.

## 2015-09-23 ENCOUNTER — Encounter: Payer: Self-pay | Admitting: Nurse Practitioner

## 2015-09-23 ENCOUNTER — Ambulatory Visit (INDEPENDENT_AMBULATORY_CARE_PROVIDER_SITE_OTHER): Payer: Medicare Other | Admitting: Nurse Practitioner

## 2015-09-23 VITALS — BP 150/88 | HR 73 | Ht 66.5 in | Wt 139.0 lb

## 2015-09-23 DIAGNOSIS — I429 Cardiomyopathy, unspecified: Secondary | ICD-10-CM

## 2015-09-23 DIAGNOSIS — I10 Essential (primary) hypertension: Secondary | ICD-10-CM

## 2015-09-23 DIAGNOSIS — I5022 Chronic systolic (congestive) heart failure: Secondary | ICD-10-CM | POA: Diagnosis not present

## 2015-09-23 DIAGNOSIS — I428 Other cardiomyopathies: Secondary | ICD-10-CM

## 2015-09-23 LAB — BASIC METABOLIC PANEL
BUN: 23 mg/dL (ref 7–25)
CO2: 25 mmol/L (ref 20–31)
Calcium: 9.6 mg/dL (ref 8.6–10.4)
Chloride: 101 mmol/L (ref 98–110)
Creat: 0.76 mg/dL (ref 0.60–0.88)
Glucose, Bld: 190 mg/dL — ABNORMAL HIGH (ref 65–99)
Potassium: 4.2 mmol/L (ref 3.5–5.3)
Sodium: 138 mmol/L (ref 135–146)

## 2015-09-23 LAB — CBC
HCT: 37.8 % (ref 36.0–46.0)
Hemoglobin: 12.3 g/dL (ref 12.0–15.0)
MCH: 29.2 pg (ref 26.0–34.0)
MCHC: 32.5 g/dL (ref 30.0–36.0)
MCV: 89.8 fL (ref 78.0–100.0)
MPV: 11 fL (ref 8.6–12.4)
Platelets: 162 10*3/uL (ref 150–400)
RBC: 4.21 MIL/uL (ref 3.87–5.11)
RDW: 15.5 % (ref 11.5–15.5)
WBC: 6.6 10*3/uL (ref 4.0–10.5)

## 2015-09-23 NOTE — Patient Instructions (Addendum)
We will be checking the following labs today - BMET and CBC   Medication Instructions:    Continue with your current medicines.     Testing/Procedures To Be Arranged:  N/A  Follow-Up:   See me in 6 months    Other Special Instructions:   Can try some Tylenol Arthritis for your knees    If you need a refill on your cardiac medications before your next appointment, please call your pharmacy.   Call the [place] office at [phone] if you have any questions, problems or concerns.

## 2015-09-23 NOTE — Progress Notes (Signed)
CARDIOLOGY OFFICE NOTE  Date:  09/23/2015    Grace Robinson Date of Birth: May 08, 1926 Medical Record [id number]  PCP:  Precious Reel, MD  Cardiologist:  Klein/Hochrein    Chief Complaint  Patient presents with  . Congestive Heart Failure    2 month check - seen for Dr. Tylene Fantasia    History of Present Illness: Grace Robinson is a 81 y.o. female who presents today for a follow up visit. Seen for Dr. Percival Spanish and Dr. Caryl Comes. She has an nonischemic CM with remote EF down to 25% that has improved with aggressive medical therapy and CRT-D. Last generator change in 2014. Other issues include HLD, IDDM, ulcerative colitis, LBBB, Meniere's disease and advanced age. Last echo showed a normal EF in September of 2015 (EF 50 to 55%).   Saw Dr. Caryl Comes back in September - she was doing well. I last saw her in May - she was having more issues then with balance.   Comes back today. Here alone. She is doing well. She remains pretty active - did a lot of things yesterday and now a little tired today. Having more knee issues - using Aleve - getting worse. She was tripped in an airport a few years ago and landed on her knees. She says her heart is doing ok. Not short of breath. No chest pain. Does have some stinging at times over her device but that typically occurs for her. No exertional symptoms. Did have a spontaneous rupture of a varicose vein - said she bled out a quart - went to the ER - now being seen at [place] and having laser surgery on her legs. She is using support stockings. No shocks. Cardiac status seems stable.   Past Medical History  Diagnosis Date  . Ischemic cardiomyopathy   . Biventricular ICD (implantable cardiac defibrillator) in place   . Diabetes mellitus   . HTN (hypertension)   . Dyslipidemia   . Ulcerative colitis   . Meniere's disease   . LBBB (left bundle branch block)     chronic  . Colon polyps   . Cellulitis   . T wave oversensing 01/17/2014    Past  Surgical History  Procedure Laterality Date  . Cardiac catheterization  08/2011  . Pacemaker insertion      Medtronic, remote- no.   . Abdominal hysterectomy    . Tonsillectomy and adenoidectomy    . Ep study  08/22/08  . Abdominal hysterectomy    . Mastoid sugery      in the setting of Meniere's disease  . Pericardial effusion  12/2002  . Implantable cardioverter defibrillator generator change N/A 08/22/2013    Procedure: IMPLANTABLE CARDIOVERTER DEFIBRILLATOR GENERATOR CHANGE;  Surgeon: Deboraha Sprang, MD;  Location: [facility] CATH LAB;  Service: Cardiovascular;  Laterality: N/A;  . Cholecystectomy       Medications: Current Outpatient Prescriptions  Medication Sig Dispense Refill  . amLODipine (NORVASC) 2.5 MG tablet Take 1 tablet (2.5 mg total) by mouth daily. 30 tablet 6  . aspirin EC 81 MG tablet Take 1 tablet (81 mg total) by mouth daily.    Marland Kitchen atorvastatin (LIPITOR) 40 MG tablet Take 40 mg by mouth daily.  3  . Biotin 1 MG CAPS Take 1,000 mg by mouth daily.    . furosemide (LASIX) 20 MG tablet Alternate taking 20 mg one day, take 40 mg the next day - repeating this pattern. 45 tablet 6  . HUMALOG MIX 75/25 (75-25)  100 UNIT/ML SUSP injection Inject 15 Units into the skin 2 (two) times daily.   1  . KLOR-CON M10 10 MEQ tablet TAKE 1 TABLET (10 MEQ TOTAL) BY MOUTH 2 (TWO) TIMES DAILY. 60 tablet 3  . losartan (COZAAR) 100 MG tablet Take 100 mg by mouth daily.    . metoprolol succinate (TOPROL-XL) 100 MG 24 hr tablet Take 1 tablet by mouth 2 (two) times daily.    . Multiple Vitamins-Minerals (CENTRUM SILVER ADULT 50+ PO) Take by mouth daily.    . naproxen sodium (ANAPROX) 220 MG tablet Take 220 mg by mouth 2 (two) times daily as needed (for knee pain).    Marland Kitchen sitaGLIPtin (JANUVIA) 100 MG tablet Take 100 mg by mouth daily.     No current facility-administered medications for this visit.    Allergies: Allergies  Allergen Reactions  . Carvedilol   . Sudafed [Pseudoephedrine]     Makes  skin feel like its crawling  . Vancomycin     Social History: The patient  reports that she has quit smoking. She does not have any smokeless tobacco history on file. She reports that she does not drink alcohol or use illicit drugs.   Family History: The patient's family history includes Cancer (age of onset: 28) in her mother; Emphysema (age of onset: 38) in her father.   Review of Systems: Please see the history of present illness.   Otherwise, the review of systems is positive for none.   All other systems are reviewed and negative.   Physical Exam: VS:  BP 150/88 mmHg  Pulse 73  Ht 5' 6.5" (1.689 m)  Wt 139 lb (63.05 kg)  BMI 22.10 kg/m2  SpO2 98% .  BMI Body mass index is 22.1 kg/(m^2).  Wt Readings from Last 3 Encounters:  09/23/15 139 lb (63.05 kg)  07/10/15 139 lb 6.4 oz (63.231 kg)  05/04/15 135 lb (61.236 kg)    General: Pleasant. Well developed, well nourished and in no acute distress.  HEENT: Normal. Neck: Supple, no JVD, carotid bruits, or masses noted.  Cardiac: Regular rate and rhythm. No murmurs, rubs, or gallops. No edema.  Respiratory:  Lungs are clear to auscultation bilaterally with normal work of breathing.  GI: Soft and nontender.  MS: No deformity or atrophy. Gait and ROM intact. Skin: Warm and dry. Color is normal.  Neuro:  Strength and sensation are intact and no gross focal deficits noted.  Psych: Alert, appropriate and with normal affect.   LABORATORY DATA:  EKG:  EKG is not ordered today.  Lab Results  Component Value Date   WBC 5.2 05/04/2015   HGB 10.9* 05/04/2015   HCT 32.0* 05/04/2015   PLT 149* 05/04/2015   GLUCOSE 388* 05/04/2015   NA 138 05/04/2015   K 3.7 05/04/2015   CL 100* 05/04/2015   CREATININE 0.90 05/04/2015   BUN 18 05/04/2015   CO2 29 02/21/2014   TSH 1.23 07/20/2011   INR 1.1* 08/13/2013    BNP (last 3 results) No results for input(s): BNP in the last 8760 hours.  ProBNP (last 3 results) No results for  input(s): PROBNP in the last 8760 hours.   Other Studies Reviewed Today:  Echo Study Conclusions from September 2015  - Left ventricle: The cavity size was normal. Systolic function was normal. The estimated ejection fraction was in the range of 50% to 55%. There was an increased relative contribution of atrial contraction to ventricular filling. Doppler parameters are consistent with abnormal left  ventricular relaxation (grade 1 diastolic dysfunction). - Ventricular septum: Septal motion showed paradox. These changes are consistent with right ventricular pacing. - Aortic valve: There was mild regurgitation. - Mitral valve: There is a calcified mobile density in the LV cavity most consistent with calcified redundant chordae tendinae. This was present on echo in 2014. There was mild regurgitation. - Pulmonic valve: There was trivial regurgitation.  Assessment / Plan: 1. NICM - with normalization of her EF - 50 to 55% by echo from September of 2015 - her cardiac status is ok. She is doing well clinically from our standpoint.   2. Underlying CRT-D - followed by Dr. Caryl Comes   3. HTN - BP better at home - she has tended to have higher blood pressures in the office.   4. Varicose veins - recheck her lab today.   5. Advancing age.  6. Knee pain - I suggested Tylenol arthritis and not NSAID routinely.   Current medicines are reviewed with the patient today.  The patient does not have concerns regarding medicines other than what has been noted above.  The following changes have been made:  See above.  Labs/ tests ordered today include:    Orders Placed This Encounter  Procedures  . Basic metabolic panel  . CBC     Disposition:   FU with me in 6 months.   Patient is agreeable to this plan and will call if any problems develop in the interim.   Signed: Burtis Junes, RN, ANP-C 09/23/2015 12:05 PM  [facility] [address] [place], [place]  [address] Phone: [phone] Fax: [phone]

## 2015-10-20 ENCOUNTER — Ambulatory Visit (INDEPENDENT_AMBULATORY_CARE_PROVIDER_SITE_OTHER): Payer: Medicare Other | Admitting: *Deleted

## 2015-10-20 DIAGNOSIS — Z95810 Presence of automatic (implantable) cardiac defibrillator: Secondary | ICD-10-CM

## 2015-10-20 DIAGNOSIS — I428 Other cardiomyopathies: Secondary | ICD-10-CM

## 2015-10-20 DIAGNOSIS — I429 Cardiomyopathy, unspecified: Secondary | ICD-10-CM | POA: Diagnosis not present

## 2015-10-20 NOTE — Progress Notes (Signed)
Remote ICD transmission.   

## 2015-10-21 NOTE — Progress Notes (Signed)
EPIC Encounter for ICM Monitoring  Patient Name: Grace Robinson is a 81 y.o. female Date: 10/21/2015 Primary Care Physican: Precious Reel, MD Primary Cardiologist: Hilbert Corrigan, NP Electrophysiologist: Faustino Congress Weight: Does not weigh at home.        In the past month, have you:  1. Gained more than 2 pounds in a day or more than 5 pounds in a week? Unknown  2. Had changes in your medications (with verification of current medications)? no  3. Had more shortness of breath than is usual for you? no  4. Limited your activity because of shortness of breath? no  5. Not been able to sleep because of shortness of breath? no  6. Had increased swelling in your feet or ankles? no  7. Had symptoms of dehydration (dizziness, dry mouth, increased thirst, decreased urine output) no  8. Had changes in sodium restriction? no  9. Been compliant with medication? Yes   ICM trend:   Follow-up plan: ICM clinic phone appointment on 11/25/2015.  Optivol thoracic impedance below baseline since 10/13/2015 to 10/20/2015 suggesting fluid retention.  She denied any HF symptoms and reported she may have missed the 2nd dosages of her Lasix during that time. Reminded her of the importance of taking all the dosages.  She stated she will be flying on Friday and may have to wait and take the medication when she lands.  Reminded her to try to choose low sodium foods.  Encouraged her to call if she begins to have any HF symptoms.  No changes today.   Copy of note sent to patient's primary care physician, primary cardiologist, and device following physician.  [name], RN, CCM 10/21/2015 3:14 PM

## 2015-10-22 LAB — CUP PACEART REMOTE DEVICE CHECK
Brady Statistic AP VS Percent: 0.37 %
Brady Statistic AS VP Percent: 69.23 %
Brady Statistic AS VS Percent: 1.01 %
Brady Statistic RA Percent Paced: 29.75 %
Brady Statistic RV Percent Paced: 0.52 %
Date Time Interrogation Session: 20161219052204
HIGH POWER IMPEDANCE MEASURED VALUE: 75 Ohm
HighPow Impedance: 56 Ohm
Implantable Lead Implant Date: 20031007
Implantable Lead Implant Date: 20060227
Implantable Lead Location: 753858
Implantable Lead Location: 753860
Implantable Lead Model: 4193
Implantable Lead Model: 4469
Implantable Lead Serial Number: 361236
Lead Channel Impedance Value: 361 Ohm
Lead Channel Impedance Value: 4047 Ohm
Lead Channel Impedance Value: 475 Ohm
Lead Channel Impedance Value: 532 Ohm
Lead Channel Pacing Threshold Amplitude: 0.875 V
Lead Channel Pacing Threshold Amplitude: 0.875 V
Lead Channel Pacing Threshold Pulse Width: 0.4 ms
Lead Channel Pacing Threshold Pulse Width: 0.4 ms
Lead Channel Pacing Threshold Pulse Width: 0.4 ms
Lead Channel Sensing Intrinsic Amplitude: 1.5 mV
Lead Channel Sensing Intrinsic Amplitude: 29.625 mV
Lead Channel Sensing Intrinsic Amplitude: 29.625 mV
Lead Channel Setting Pacing Amplitude: 2 V
Lead Channel Setting Pacing Pulse Width: 0.4 ms
Lead Channel Setting Sensing Sensitivity: 0.6 mV
MDC IDC LEAD IMPLANT DT: 20031007
MDC IDC LEAD IMPLANT DT: 20031007
MDC IDC LEAD LOCATION: 753859
MDC IDC LEAD LOCATION: 753860
MDC IDC LEAD MODEL: 4470
MDC IDC LEAD MODEL: 6949
MDC IDC LEAD SERIAL: 337988
MDC IDC MSMT BATTERY REMAINING LONGEVITY: 89 mo
MDC IDC MSMT BATTERY VOLTAGE: 2.98 V
MDC IDC MSMT LEADCHNL LV IMPEDANCE VALUE: 4047 Ohm
MDC IDC MSMT LEADCHNL RA IMPEDANCE VALUE: 475 Ohm
MDC IDC MSMT LEADCHNL RA PACING THRESHOLD AMPLITUDE: 0.75 V
MDC IDC MSMT LEADCHNL RA SENSING INTR AMPL: 1.5 mV
MDC IDC SET LEADCHNL RA PACING AMPLITUDE: 2 V
MDC IDC STAT BRADY AP VP PERCENT: 29.38 %

## 2015-10-23 ENCOUNTER — Encounter: Payer: Self-pay | Admitting: Cardiology

## 2015-11-25 ENCOUNTER — Ambulatory Visit (INDEPENDENT_AMBULATORY_CARE_PROVIDER_SITE_OTHER): Payer: Medicare Other

## 2015-11-25 DIAGNOSIS — Z95810 Presence of automatic (implantable) cardiac defibrillator: Secondary | ICD-10-CM

## 2015-11-25 DIAGNOSIS — I5022 Chronic systolic (congestive) heart failure: Secondary | ICD-10-CM

## 2015-11-26 NOTE — Progress Notes (Signed)
EPIC Encounter for ICM Monitoring  Patient Name: Grace Robinson is a 82 y.o. female Date: 11/26/2015 Primary Care Physican: Precious Reel, MD Primary Cardiologist: Hilbert Corrigan, NP Electrophysiologist: Allred Dry Weight: Unknown  Bi-V Pacing 98.3%       In the past month, have you:  1. Gained more than 2 pounds in a day or more than 5 pounds in a week? unknown  2. Had changes in your medications (with verification of current medications)? no  3. Had more shortness of breath than is usual for you? no  4. Limited your activity because of shortness of breath? no  5. Not been able to sleep because of shortness of breath? no  6. Had increased swelling in your feet or ankles? no  7. Had symptoms of dehydration (dizziness, dry mouth, increased thirst, decreased urine output) no  8. Had changes in sodium restriction? no  9. Been compliant with medication? Yes   ICM trend: 3 month view for 11/25/2015    ICM trend: 1 year view for 11/25/2015   Follow-up plan: ICM clinic phone appointment on 12/29/2015.  Optivol thoracic impedance trending along reference line since 11/08/2015.  She denied any HF symptoms and stated she is doing fine at this time.  No changes today.   Copy of note sent to patient's primary care physician, primary cardiologist, and device following physician.  [name], RN, CCM 11/26/2015 1:25 PM

## 2015-12-09 DIAGNOSIS — H90A21 Sensorineural hearing loss, unilateral, right ear, with restricted hearing on the contralateral side: Secondary | ICD-10-CM | POA: Diagnosis not present

## 2015-12-09 DIAGNOSIS — H90A22 Sensorineural hearing loss, unilateral, left ear, with restricted hearing on the contralateral side: Secondary | ICD-10-CM | POA: Diagnosis not present

## 2015-12-09 DIAGNOSIS — Z974 Presence of external hearing-aid: Secondary | ICD-10-CM | POA: Diagnosis not present

## 2015-12-09 DIAGNOSIS — H8102 Meniere's disease, left ear: Secondary | ICD-10-CM | POA: Diagnosis not present

## 2015-12-17 DIAGNOSIS — H5203 Hypermetropia, bilateral: Secondary | ICD-10-CM | POA: Diagnosis not present

## 2015-12-17 DIAGNOSIS — H04123 Dry eye syndrome of bilateral lacrimal glands: Secondary | ICD-10-CM | POA: Diagnosis not present

## 2015-12-17 DIAGNOSIS — H52203 Unspecified astigmatism, bilateral: Secondary | ICD-10-CM | POA: Diagnosis not present

## 2015-12-17 DIAGNOSIS — Z961 Presence of intraocular lens: Secondary | ICD-10-CM | POA: Diagnosis not present

## 2015-12-17 DIAGNOSIS — E119 Type 2 diabetes mellitus without complications: Secondary | ICD-10-CM | POA: Diagnosis not present

## 2015-12-24 DIAGNOSIS — M25562 Pain in left knee: Secondary | ICD-10-CM | POA: Diagnosis not present

## 2015-12-29 ENCOUNTER — Ambulatory Visit (INDEPENDENT_AMBULATORY_CARE_PROVIDER_SITE_OTHER): Payer: Medicare Other

## 2015-12-29 DIAGNOSIS — Z95810 Presence of automatic (implantable) cardiac defibrillator: Secondary | ICD-10-CM

## 2015-12-29 DIAGNOSIS — I5022 Chronic systolic (congestive) heart failure: Secondary | ICD-10-CM | POA: Diagnosis not present

## 2016-01-01 NOTE — Progress Notes (Signed)
EPIC Encounter for ICM Monitoring  Patient Name: Grace Robinson is a 82 y.o. female Date: 01/01/2016 Primary Care Physican: Precious Reel, MD Primary Cardiologist: Hilbert Corrigan, NP Electrophysiologist: Faustino Congress Weight: Does not weigh at home  Bi-V Pacing 98.4%       In the past month, have you:  1. Gained more than 2 pounds in a day or more than 5 pounds in a week? Unsure  2. Had changes in your medications (with verification of current medications)? no  3. Had more shortness of breath than is usual for you? no  4. Limited your activity because of shortness of breath? no  5. Not been able to sleep because of shortness of breath? no  6. Had increased swelling in your feet or ankles? no  7. Had symptoms of dehydration (dizziness, dry mouth, increased thirst, decreased urine output) no  8. Had changes in sodium restriction? no  9. Been compliant with medication? Yes   ICM trend: 3 month view for 12/29/2015   ICM trend: 1 year view for 12/29/2015   Follow-up plan: ICM clinic phone appointment on 02/02/2016.  Optivol thoracic impedance above reference line 12/27/2015 to 12/28/2015 suggesting dryness.  Encouraged her to drink adequate amount of fluids.  She stated she is feeling good and denied any problems at this time.   No changes today.     Copy of note sent to patient's primary care physician, primary cardiologist, and device following physician.  [name], RN, CCM 01/01/2016 10:13 AM

## 2016-01-06 ENCOUNTER — Encounter: Payer: Self-pay | Admitting: Nurse Practitioner

## 2016-02-02 ENCOUNTER — Ambulatory Visit (INDEPENDENT_AMBULATORY_CARE_PROVIDER_SITE_OTHER): Payer: Medicare Other | Admitting: *Deleted

## 2016-02-02 ENCOUNTER — Telehealth: Payer: Self-pay

## 2016-02-02 DIAGNOSIS — I5022 Chronic systolic (congestive) heart failure: Secondary | ICD-10-CM

## 2016-02-02 DIAGNOSIS — I429 Cardiomyopathy, unspecified: Secondary | ICD-10-CM

## 2016-02-02 DIAGNOSIS — Z95810 Presence of automatic (implantable) cardiac defibrillator: Secondary | ICD-10-CM

## 2016-02-02 DIAGNOSIS — I428 Other cardiomyopathies: Secondary | ICD-10-CM

## 2016-02-02 NOTE — Progress Notes (Signed)
EPIC Encounter for ICM Monitoring  Patient Name: Grace Robinson is a 82 y.o. female Date: 02/02/2016 Primary Care Physican: Precious Reel, MD Primary Cardiologist: Hilbert Corrigan, NP Electrophysiologist: Faustino Congress Weight: Does not weigh   Bi-V Pacing 98.4%      In the past month, have you:  1. Gained more than 2 pounds in a day or more than 5 pounds in a week? no  2. Had changes in your medications (with verification of current medications)? no  3. Had more shortness of breath than is usual for you? no  4. Limited your activity because of shortness of breath? no  5. Not been able to sleep because of shortness of breath? no  6. Had increased swelling in your feet or ankles? no  7. Had symptoms of dehydration (dizziness, dry mouth, increased thirst, decreased urine output) no  8. Had changes in sodium restriction? no  9. Been compliant with medication? Yes   ICM trend: 3 month view for 02/02/2016   ICM trend: 1 year view for 4/3/20174   Follow-up plan: ICM clinic phone appointment on 03/05/2016.  Thoracic impedance trending along reference line suggesting stable fluid levels.  Patient denied fluid symptoms.   She stated she is having problems with one of her legs after having laser surgery for varicose veins.  She has a follow up appointment to check the leg.  Encouraged to call for any fluid symptoms.  No changes today.    [name], RN, CCM 02/02/2016 12:42 PM

## 2016-02-02 NOTE — Progress Notes (Signed)
Remote ICD transmission.   

## 2016-02-02 NOTE — Telephone Encounter (Signed)
Remote ICM transmission received.  Attempted patient call and left message for return call.   

## 2016-02-03 DIAGNOSIS — E1051 Type 1 diabetes mellitus with diabetic peripheral angiopathy without gangrene: Secondary | ICD-10-CM | POA: Diagnosis not present

## 2016-02-03 DIAGNOSIS — L603 Nail dystrophy: Secondary | ICD-10-CM | POA: Diagnosis not present

## 2016-02-03 DIAGNOSIS — I739 Peripheral vascular disease, unspecified: Secondary | ICD-10-CM | POA: Diagnosis not present

## 2016-02-19 DIAGNOSIS — M25562 Pain in left knee: Secondary | ICD-10-CM | POA: Diagnosis not present

## 2016-03-05 ENCOUNTER — Ambulatory Visit (INDEPENDENT_AMBULATORY_CARE_PROVIDER_SITE_OTHER): Payer: Medicare Other

## 2016-03-05 DIAGNOSIS — Z794 Long term (current) use of insulin: Secondary | ICD-10-CM | POA: Diagnosis not present

## 2016-03-05 DIAGNOSIS — E784 Other hyperlipidemia: Secondary | ICD-10-CM | POA: Diagnosis not present

## 2016-03-05 DIAGNOSIS — E1122 Type 2 diabetes mellitus with diabetic chronic kidney disease: Secondary | ICD-10-CM | POA: Diagnosis not present

## 2016-03-05 DIAGNOSIS — I130 Hypertensive heart and chronic kidney disease with heart failure and stage 1 through stage 4 chronic kidney disease, or unspecified chronic kidney disease: Secondary | ICD-10-CM | POA: Diagnosis not present

## 2016-03-05 DIAGNOSIS — I420 Dilated cardiomyopathy: Secondary | ICD-10-CM | POA: Diagnosis not present

## 2016-03-05 DIAGNOSIS — Z95810 Presence of automatic (implantable) cardiac defibrillator: Secondary | ICD-10-CM | POA: Diagnosis not present

## 2016-03-05 DIAGNOSIS — I5022 Chronic systolic (congestive) heart failure: Secondary | ICD-10-CM

## 2016-03-05 DIAGNOSIS — Z6822 Body mass index (BMI) 22.0-22.9, adult: Secondary | ICD-10-CM | POA: Diagnosis not present

## 2016-03-05 DIAGNOSIS — Z950 Presence of cardiac pacemaker: Secondary | ICD-10-CM | POA: Diagnosis not present

## 2016-03-05 DIAGNOSIS — I10 Essential (primary) hypertension: Secondary | ICD-10-CM | POA: Diagnosis not present

## 2016-03-05 LAB — CUP PACEART REMOTE DEVICE CHECK
Brady Statistic AP VP Percent: 34.83 %
Brady Statistic AP VS Percent: 0.48 %
Brady Statistic AS VP Percent: 63.74 %
Brady Statistic AS VS Percent: 0.95 %
Brady Statistic RA Percent Paced: 35.31 %
Brady Statistic RV Percent Paced: 0.58 %
Date Time Interrogation Session: 20170403041703
HighPow Impedance: 54 Ohm
HighPow Impedance: 71 Ohm
Implantable Lead Implant Date: 20031007
Implantable Lead Implant Date: 20031007
Implantable Lead Implant Date: 20060227
Implantable Lead Location: 753858
Implantable Lead Location: 753859
Implantable Lead Location: 753860
Implantable Lead Model: 4193
Implantable Lead Model: 4470
Implantable Lead Model: 6949
Implantable Lead Serial Number: 337988
Lead Channel Impedance Value: 342 Ohm
Lead Channel Impedance Value: 4047 Ohm
Lead Channel Impedance Value: 4047 Ohm
Lead Channel Impedance Value: 456 Ohm
Lead Channel Impedance Value: 475 Ohm
Lead Channel Impedance Value: 513 Ohm
Lead Channel Pacing Threshold Amplitude: 0.75 V
Lead Channel Pacing Threshold Amplitude: 1 V
Lead Channel Pacing Threshold Amplitude: 1 V
Lead Channel Pacing Threshold Pulse Width: 0.4 ms
Lead Channel Pacing Threshold Pulse Width: 0.4 ms
Lead Channel Sensing Intrinsic Amplitude: 1.25 mV
Lead Channel Sensing Intrinsic Amplitude: 1.25 mV
Lead Channel Setting Pacing Amplitude: 2 V
Lead Channel Setting Pacing Amplitude: 2 V
Lead Channel Setting Sensing Sensitivity: 0.6 mV
MDC IDC LEAD IMPLANT DT: 20031007
MDC IDC LEAD LOCATION: 753860
MDC IDC LEAD MODEL: 4469
MDC IDC LEAD SERIAL: 361236
MDC IDC MSMT BATTERY REMAINING LONGEVITY: 83 mo
MDC IDC MSMT BATTERY VOLTAGE: 2.99 V
MDC IDC MSMT LEADCHNL RV PACING THRESHOLD PULSEWIDTH: 0.4 ms
MDC IDC MSMT LEADCHNL RV SENSING INTR AMPL: 28.375 mV
MDC IDC MSMT LEADCHNL RV SENSING INTR AMPL: 28.375 mV
MDC IDC SET LEADCHNL LV PACING PULSEWIDTH: 0.4 ms

## 2016-03-05 NOTE — Progress Notes (Signed)
EPIC Encounter for ICM Monitoring  Patient Name: Grace Robinson is a 82 y.o. female Date: 03/05/2016 Primary Care Physican: Precious Reel, MD Primary Cardiologist: Hilbert Corrigan, NP Electrophysiologist: Faustino Congress Weight: unknown   Bi-V Pacing 98.4%       In the past month, have you:  1. Gained more than 2 pounds in a day or more than 5 pounds in a week? unknown  2. Had changes in your medications (with verification of current medications)? no  3. Had more shortness of breath than is usual for you? no  4. Limited your activity because of shortness of breath? no  5. Not been able to sleep because of shortness of breath? no  6. Had increased swelling in your feet, ankles, legs or stomach area? no  7. Had symptoms of dehydration (dizziness, dry mouth, increased thirst, decreased urine output) no  8. Had changes in sodium restriction? no  9. Been compliant with medication? Yes  ICM trend: 3 month view for 03/05/2016  ICM trend: 1 year view for 03/05/2016   Follow-up plan: ICM clinic phone appointment 04/08/2016.  Office appointment with Truitt Merle, NP on 03/24/2016.  FLUID LEVELS: Corvue thoracic impedance trending along baseline suggesting stable fluid levels.   SYMPTOMS:  None.  Denied any symptoms such as weight gain, SOB and/or lower extremity swelling. Encouraged to call for any fluid symptoms.  No changes today.     [name], RN, CCM 03/05/2016 3:09 PM

## 2016-03-10 ENCOUNTER — Encounter: Payer: Self-pay | Admitting: Cardiology

## 2016-03-11 DIAGNOSIS — M11262 Other chondrocalcinosis, left knee: Secondary | ICD-10-CM | POA: Diagnosis not present

## 2016-03-11 DIAGNOSIS — M25562 Pain in left knee: Secondary | ICD-10-CM | POA: Diagnosis not present

## 2016-03-17 ENCOUNTER — Ambulatory Visit: Payer: Medicare Other | Admitting: Nurse Practitioner

## 2016-03-24 ENCOUNTER — Encounter: Payer: Self-pay | Admitting: Nurse Practitioner

## 2016-03-24 ENCOUNTER — Ambulatory Visit (INDEPENDENT_AMBULATORY_CARE_PROVIDER_SITE_OTHER): Payer: Medicare Other | Admitting: Nurse Practitioner

## 2016-03-24 VITALS — BP 110/60 | HR 72 | Ht 66.5 in | Wt 139.0 lb

## 2016-03-24 DIAGNOSIS — I429 Cardiomyopathy, unspecified: Secondary | ICD-10-CM

## 2016-03-24 DIAGNOSIS — I5022 Chronic systolic (congestive) heart failure: Secondary | ICD-10-CM | POA: Diagnosis not present

## 2016-03-24 DIAGNOSIS — I10 Essential (primary) hypertension: Secondary | ICD-10-CM | POA: Diagnosis not present

## 2016-03-24 DIAGNOSIS — I428 Other cardiomyopathies: Secondary | ICD-10-CM

## 2016-03-24 NOTE — Patient Instructions (Addendum)
We will be checking the following labs today - NONE   Medication Instructions:    Continue with your current medicines.     Testing/Procedures To Be Arranged:  N/A  Follow-Up:   See Dr. Caryl Comes in September  See me back in December    Other Special Instructions:   N/A    If you need a refill on your cardiac medications before your next appointment, please call your pharmacy.   Call the [place] office at [phone] if you have any questions, problems or concerns.

## 2016-03-24 NOTE — Progress Notes (Addendum)
CARDIOLOGY OFFICE NOTE  Date:  03/24/2016    Grace Robinson Date of Birth: 10/03/1926 Medical Record [id number]  PCP:  Precious Reel, MD  Cardiologist:  Hochrein/Klein    Chief Complaint  Patient presents with  . Congestive Heart Failure  . Cardiomyopathy  . Hyperlipidemia    Seen for Dr. Caryl Comes    History of Present Illness: Grace Robinson is a 82 y.o. female who presents today for a follow up visit. Seen for Dr. Percival Spanish and Dr. Caryl Comes. She has an nonischemic CM with remote EF down to 25% that has improved with aggressive medical therapy and CRT-D. Last generator change in 2014. Other issues include HLD, IDDM, ulcerative colitis, LBBB, Meniere's disease and advanced age. Last echo showed a normal EF in September of 2015 (EF 50 to 55%).   Saw Dr. Caryl Comes back in September - she was doing well. I last saw her in November and she was doing ok from our standpoint.   Comes back today. Here alone. She is doing well. She is mostly limited by knee pain - she [place] the airport [place] 4 [place] - has just started bothering her in the past few months - has had a few shots of cortisone - this has made her A1C go over 10. No falls but she is having more railings placed at her home. No chest pain. Breathing is good. She feels ok on her medicines. Weight is stable. Followed closely by PCP.   Past Medical History  Diagnosis Date  . Ischemic cardiomyopathy   . Biventricular ICD (implantable cardiac defibrillator) in place   . Diabetes mellitus   . HTN (hypertension)   . Dyslipidemia   . Ulcerative colitis   . Meniere's disease   . LBBB (left bundle branch block)     chronic  . Colon polyps   . Cellulitis   . T wave oversensing 01/17/2014    Past Surgical History  Procedure Laterality Date  . Cardiac catheterization  08/2011  . Pacemaker insertion      Medtronic, remote- no.   . Abdominal hysterectomy    . Tonsillectomy and adenoidectomy    . Ep study  08/22/08    . Abdominal hysterectomy    . Mastoid sugery      in the setting of Meniere's disease  . Pericardial effusion  12/2002  . Implantable cardioverter defibrillator generator change N/A 08/22/2013    Procedure: IMPLANTABLE CARDIOVERTER DEFIBRILLATOR GENERATOR CHANGE;  Surgeon: Deboraha Sprang, MD;  Location: [facility] CATH LAB;  Service: Cardiovascular;  Laterality: N/A;  . Cholecystectomy       Medications: Current Outpatient Prescriptions  Medication Sig Dispense Refill  . amLODipine (NORVASC) 5 MG tablet Take 5 mg by mouth daily. (5mg  ) four days a week (10mg  ) three days a week    . aspirin EC 81 MG tablet Take 1 tablet (81 mg total) by mouth daily.    Marland Kitchen atorvastatin (LIPITOR) 40 MG tablet Take 40 mg by mouth daily.  3  . Biotin 1 MG CAPS Take 1,000 mg by mouth daily.    . Cholecalciferol (VITAMIN D3) 1000 units CAPS Take 2 capsules by mouth daily.    . furosemide (LASIX) 20 MG tablet Alternate taking 20 mg one day, take 40 mg the next day - repeating this pattern. 45 tablet 6  . losartan (COZAAR) 100 MG tablet Take 100 mg by mouth daily.    . metoprolol succinate (TOPROL-XL) 100  MG 24 hr tablet Take 1 tablet by mouth 2 (two) times daily.    . potassium chloride (K-DUR) 10 MEQ tablet Take 10 mEq by mouth daily.    . sitaGLIPtin (JANUVIA) 100 MG tablet Take 100 mg by mouth daily.    Marland Kitchen HUMALOG MIX 75/25 (75-25) 100 UNIT/ML SUSP injection Inject 15 Units into the skin 2 (two) times daily with a meal. Over 200 take (45 units) break up in three part to take 3 times daily  1   No current facility-administered medications for this visit.    Allergies: Allergies  Allergen Reactions  . Carvedilol   . Sudafed [Pseudoephedrine]     Makes skin feel like its crawling  . Vancomycin     Social History: The patient  reports that she has quit smoking. She does not have any smokeless tobacco history on file. She reports that she does not drink alcohol or use illicit drugs.   Family History: The  patient's family history includes Cancer (age of onset: 61) in her mother; Emphysema (age of onset: 41) in her father.   Review of Systems: Please see the history of present illness.   Otherwise, the review of systems is positive for none.   All other systems are reviewed and negative.   Physical Exam: VS:  BP 110/60 mmHg  Pulse 72  Ht 5' 6.5" (1.689 m)  Wt 139 lb (63.05 kg)  BMI 22.10 kg/m2 .  BMI Body mass index is 22.1 kg/(m^2).  Wt Readings from Last 3 Encounters:  03/24/16 139 lb (63.05 kg)  09/23/15 139 lb (63.05 kg)  07/10/15 139 lb 6.4 oz (63.231 kg)    General: Pleasant. She looks younger than her stated age. She is alert and in no acute distress.  HEENT: Normal. Neck: Supple, no JVD, carotid bruits, or masses noted.  Cardiac: Regular rate and rhythm. Heart tones are distant.  No edema.  Respiratory:  Lungs are clear to auscultation bilaterally with normal work of breathing.  GI: Soft and nontender.  MS: No deformity or atrophy. Gait and ROM intact. Skin: Warm and dry. Color is normal.  Neuro:  Strength and sensation are intact and no gross focal deficits noted.  Psych: Alert, appropriate and with normal affect.   LABORATORY DATA:  EKG:  EKG is not ordered today.  Lab Results  Component Value Date   WBC 6.6 09/23/2015   HGB 12.3 09/23/2015   HCT 37.8 09/23/2015   PLT 162 09/23/2015   GLUCOSE 190* 09/23/2015   NA 138 09/23/2015   K 4.2 09/23/2015   CL 101 09/23/2015   CREATININE 0.76 09/23/2015   BUN 23 09/23/2015   CO2 25 09/23/2015   TSH 1.23 07/20/2011   INR 1.1* 08/13/2013    BNP (last 3 results) No results for input(s): BNP in the last 8760 hours.  ProBNP (last 3 results) No results for input(s): PROBNP in the last 8760 hours.   Other Studies Reviewed Today:  Echo Study Conclusions from September 2015  - Left ventricle: The cavity size was normal. Systolic function was normal. The estimated ejection fraction was in the range of 50% to  55%. There was an increased relative contribution of atrial contraction to ventricular filling. Doppler parameters are consistent with abnormal left ventricular relaxation (grade 1 diastolic dysfunction). - Ventricular septum: Septal motion showed paradox. These changes are consistent with right ventricular pacing. - Aortic valve: There was mild regurgitation. - Mitral valve: There is a calcified mobile density in the LV  cavity most consistent with calcified redundant chordae tendinae. This was present on echo in 2014. There was mild regurgitation. - Pulmonic valve: There was trivial regurgitation.  Assessment / Plan: 1. NICM - with normalization of her EF - 50 to 55% by echo from September of 2015 - her cardiac status is ok. She continues to do well clinically from our standpoint.   2. Underlying CRT-D - followed by Dr. Caryl Comes - to see him in September for device check.   3. HTN - BP looks good on her current regimen.   4. Advanced age - turning 76 later this year - she does remarkably well. Encouraged her to stay safe and active.   Current medicines are reviewed with the patient today.  The patient does not have concerns regarding medicines other than what has been noted above.  The following changes have been made:  See above.  Labs/ tests ordered today include:   No orders of the defined types were placed in this encounter.     Disposition:   FU with me in 6 months.    Patient is agreeable to this plan and will call if any problems develop in the interim.   Signed: Burtis Junes, RN, ANP-C 03/24/2016 3:20 PM  [facility] [address] [place], [place]  [address] Phone: [phone] Fax: [phone]        Addendum: 04/05/2016 Received request for medical clearance for left knee arthroscopy - date to be determined by Dr. Berenice Primas.   She is felt to be an acceptable candidate for this procedure. She does have  BiV/ICD in place (Medtronic).   Will be available as needed.   Burtis Junes, RN, [facility] [address] [place], [place]  [address] [phone]

## 2016-03-26 DIAGNOSIS — E1140 Type 2 diabetes mellitus with diabetic neuropathy, unspecified: Secondary | ICD-10-CM | POA: Diagnosis not present

## 2016-03-30 DIAGNOSIS — M25562 Pain in left knee: Secondary | ICD-10-CM | POA: Diagnosis not present

## 2016-04-05 NOTE — Addendum Note (Signed)
Addended by: Burtis Junes on: 04/05/2016 12:45 PM   Modules accepted: Miquel Dunn

## 2016-04-06 ENCOUNTER — Telehealth: Payer: Self-pay | Admitting: Internal Medicine

## 2016-04-06 NOTE — Telephone Encounter (Signed)
Follow-up       The office had faxed over a surgery form on June 1st and the office is re-faxing the form today   Request for surgical clearance:  1. What type of surgery is being performed? Lt knee surgery  When is this surgery scheduled? Pending  Are there any medications that need to be held prior to surgery and how long? The office did not provide information  2. Name of physician performing surgery? Dr. Dorna Leitz  3. What is your office phone and fax number? Fax number [phone]/office number [phone]

## 2016-04-06 NOTE — Telephone Encounter (Signed)
Clearance form not received from 6/1. I did receive the form faxed today. The patient last saw Dr. Caryl Comes in 07/2015. She just saw Truitt Merle, NP- 03/24/16. Form placed in [place] box for her to review clearance.

## 2016-04-07 NOTE — Telephone Encounter (Signed)
This clearance was faxed over to Dr. Berenice Primas office on June 5

## 2016-04-08 ENCOUNTER — Ambulatory Visit (INDEPENDENT_AMBULATORY_CARE_PROVIDER_SITE_OTHER): Payer: Medicare Other

## 2016-04-08 DIAGNOSIS — Z95810 Presence of automatic (implantable) cardiac defibrillator: Secondary | ICD-10-CM

## 2016-04-08 DIAGNOSIS — I5022 Chronic systolic (congestive) heart failure: Secondary | ICD-10-CM

## 2016-04-08 NOTE — Progress Notes (Signed)
EPIC Encounter for ICM Monitoring  Patient Name: Grace Robinson is a 82 y.o. female Date: 04/08/2016 Primary Care Physican: Precious Reel, MD Primary Cardiologist: Hilbert Corrigan, NP Electrophysiologist: Faustino Congress Weight: unknown  Bi-V Pacing 98.2%      In the past month, have you:  1. Gained more than 2 pounds in a day or more than 5 pounds in a week? no  2. Had changes in your medications (with verification of current medications)? no  3. Had more shortness of breath than is usual for you? no  4. Limited your activity because of shortness of breath? no  5. Not been able to sleep because of shortness of breath? no  6. Had increased swelling in your feet, ankles, legs or stomach area? no  7. Had symptoms of dehydration (dizziness, dry mouth, increased thirst, decreased urine output) no  8. Had changes in sodium restriction? no  9. Been compliant with medication? Yes  ICM trend: 3 month view for 04/08/2016  ICM trend: 1 year view for 04/08/2016   Follow-up plan: ICM clinic phone appointment 05/13/2016.    FLUID LEVELS: Optivol thoracic impedance trending along baseline suggesting stable fluid levels.    SYMPTOMS:  She is have knee arthroscopy in the next week.  Denied any symptoms such as weight gain of 3 pounds overnight or 5 pounds within a week, SOB and/or lower extremity swelling.  Encouraged to call for any fluid symptoms.    RECOMMENDATIONS: No changes today.     [name], RN, CCM 04/08/2016 12:31 PM

## 2016-04-09 NOTE — Telephone Encounter (Signed)
Follow-up     The surgical center for the pt is calling the faxed back clearance form did not state how long the pt needed to be off the Aspirin, so Elmyra Ricks is asking if you would put something in "Epic" so she can let the "Surgical Center" know. Elmyra Ricks stated usually to be off Aspirin for 3 days prior for procedure     The pt is having the procedure on June 14th.

## 2016-04-09 NOTE — Telephone Encounter (Signed)
Per lori gerhardt np, pt is clear to be off aspirin for 3 days prior to procedure.

## 2016-04-12 ENCOUNTER — Telehealth: Payer: Self-pay | Admitting: Internal Medicine

## 2016-04-12 DIAGNOSIS — E1122 Type 2 diabetes mellitus with diabetic chronic kidney disease: Secondary | ICD-10-CM | POA: Diagnosis not present

## 2016-04-12 NOTE — Telephone Encounter (Signed)
Follow-up     The office is calling back concerning the call on Friday for the pt to be off their Aspirin prior to surgery 3 days prior to procedure. The office states no-one called or faxed anything back.  The pt is scheduled on June 14 th.

## 2016-04-12 NOTE — Telephone Encounter (Signed)
Cecille Rubin was handling this clearance. Will forward to her for review. There is no phone call documented in her chart from Friday.

## 2016-04-12 NOTE — Telephone Encounter (Signed)
There is a telephone note listed in pt's chart that was noted on 6/6 that pt is suppose to be off ASA 3 days prior to surgery.  Elmyra Ricks from [facility] [phone] stated that is all she needed and reviewed the telephone note in epic. I stated appreciation.

## 2016-04-13 ENCOUNTER — Encounter (HOSPITAL_COMMUNITY): Payer: Self-pay | Admitting: *Deleted

## 2016-04-13 ENCOUNTER — Other Ambulatory Visit: Payer: Self-pay | Admitting: Orthopedic Surgery

## 2016-04-13 NOTE — Progress Notes (Signed)
Grace Robinson denies chest pain or shortness of breath.  "I feel wonderful, jut this knee is a problem."  Grace Robinson reports that CBG was 175 this am and that CBG is never less than 70.  Patient does not have any clear juice or soda in the house.  I instructed her to call Pre- OP and I gave her the number.

## 2016-04-14 ENCOUNTER — Encounter (HOSPITAL_COMMUNITY)
Admission: RE | Disposition: A | Discharge status: Home / Self Care | Payer: Self-pay | Source: Ambulatory Visit | Attending: Orthopedic Surgery

## 2016-04-14 ENCOUNTER — Ambulatory Visit (HOSPITAL_COMMUNITY): Payer: Medicare Other | Admitting: Certified Registered"

## 2016-04-14 ENCOUNTER — Encounter (HOSPITAL_COMMUNITY): Payer: Self-pay | Admitting: Certified Registered"

## 2016-04-14 ENCOUNTER — Ambulatory Visit (HOSPITAL_COMMUNITY)
Admission: RE | Admit: 2016-04-14 | Discharge: 2016-04-14 | Disposition: A | Discharge status: Home / Self Care | Payer: Medicare Other | Source: Ambulatory Visit | Attending: Orthopedic Surgery | Admitting: Orthopedic Surgery

## 2016-04-14 DIAGNOSIS — S83282A Other tear of lateral meniscus, current injury, left knee, initial encounter: Secondary | ICD-10-CM | POA: Diagnosis not present

## 2016-04-14 DIAGNOSIS — I509 Heart failure, unspecified: Secondary | ICD-10-CM | POA: Diagnosis not present

## 2016-04-14 DIAGNOSIS — M23322 Other meniscus derangements, posterior horn of medial meniscus, left knee: Secondary | ICD-10-CM | POA: Diagnosis present

## 2016-04-14 DIAGNOSIS — M6752 Plica syndrome, left knee: Secondary | ICD-10-CM | POA: Insufficient documentation

## 2016-04-14 DIAGNOSIS — S83242A Other tear of medial meniscus, current injury, left knee, initial encounter: Secondary | ICD-10-CM | POA: Diagnosis not present

## 2016-04-14 DIAGNOSIS — M23362 Other meniscus derangements, other lateral meniscus, left knee: Secondary | ICD-10-CM | POA: Diagnosis not present

## 2016-04-14 HISTORY — DX: Asymptomatic varicose veins of unspecified lower extremity: I83.90

## 2016-04-14 HISTORY — DX: Unspecified hearing loss, unspecified ear: H91.90

## 2016-04-14 HISTORY — DX: Heart failure, unspecified: I50.9

## 2016-04-14 HISTORY — PX: KNEE ARTHROSCOPY: SHX127

## 2016-04-14 LAB — BASIC METABOLIC PANEL
Anion gap: 9 (ref 5–15)
BUN: 11 mg/dL (ref 6–20)
CO2: 26 mmol/L (ref 22–32)
Calcium: 9.3 mg/dL (ref 8.9–10.3)
Chloride: 105 mmol/L (ref 101–111)
Creatinine, Ser: 0.81 mg/dL (ref 0.44–1.00)
GFR calc Af Amer: >60 mL/min (ref >60)
GFR calc non Af Amer: >60 mL/min (ref >60)
GLUCOSE: 174 mg/dL — AB (ref 65–99)
Potassium: 3.4 mmol/L — ABNORMAL LOW (ref 3.5–5.1)
Sodium: 140 mmol/L (ref 135–145)

## 2016-04-14 LAB — CBC
HCT: 38.4 % (ref 36.0–46.0)
HEMOGLOBIN: 12.3 g/dL (ref 12.0–15.0)
MCH: 30.7 pg (ref 26.0–34.0)
MCHC: 32 g/dL (ref 30.0–36.0)
MCV: 95.8 fL (ref 78.0–100.0)
Platelets: 149 10*3/uL — ABNORMAL LOW (ref 150–400)
RBC: 4.01 MIL/uL (ref 3.87–5.11)
RDW: 13.7 % (ref 11.5–15.5)
WBC: 6.3 10*3/uL (ref 4.0–10.5)

## 2016-04-14 LAB — GLUCOSE, CAPILLARY
Glucose-Capillary: 175 mg/dL — ABNORMAL HIGH (ref 65–99)
Glucose-Capillary: 205 mg/dL — ABNORMAL HIGH (ref 65–99)

## 2016-04-14 SURGERY — ARTHROSCOPY, KNEE
Anesthesia: General | Site: Knee | Laterality: Left

## 2016-04-14 MED ORDER — OXYCODONE HCL 5 MG PO TABS
ORAL_TABLET | ORAL | Status: AC
  Administered 2016-04-14: 5 mg
  Filled 2016-04-14: qty 1

## 2016-04-14 MED ORDER — ONDANSETRON HCL 4 MG/2ML IJ SOLN: 4.0000 mg | Freq: Four times a day (QID) | INTRAMUSCULAR | Status: DC | PRN

## 2016-04-14 MED ORDER — BUPIVACAINE HCL (PF) 0.5 % IJ SOLN
INTRAMUSCULAR | Status: DC | PRN
  Administered 2016-04-14: 20 mL

## 2016-04-14 MED ORDER — PROPOFOL 10 MG/ML IV BOLUS
INTRAVENOUS | Status: AC
  Filled 2016-04-14: qty 20

## 2016-04-14 MED ORDER — PROPOFOL 10 MG/ML IV BOLUS
INTRAVENOUS | Status: DC | PRN
  Administered 2016-04-14: 120 mg via INTRAVENOUS

## 2016-04-14 MED ORDER — FENTANYL CITRATE (PF) 250 MCG/5ML IJ SOLN
INTRAMUSCULAR | Status: DC | PRN
  Administered 2016-04-14 (×2): 50 ug via INTRAVENOUS

## 2016-04-14 MED ORDER — OXYCODONE HCL 5 MG/5ML PO SOLN: 5.0000 mg | Freq: Once | ORAL | Status: AC | PRN

## 2016-04-14 MED ORDER — FENTANYL CITRATE (PF) 250 MCG/5ML IJ SOLN
INTRAMUSCULAR | Status: AC
Start: 2016-04-14 — End: 2016-04-14
  Filled 2016-04-14: qty 5

## 2016-04-14 MED ORDER — CEFAZOLIN SODIUM-DEXTROSE 2-4 GM/100ML-% IV SOLN
2.0000 g | INTRAVENOUS | Status: AC
  Administered 2016-04-14: 2 g via INTRAVENOUS

## 2016-04-14 MED ORDER — HYDROCODONE-ACETAMINOPHEN 5-325 MG PO TABS: 1.0000 | ORAL_TABLET | Freq: Four times a day (QID) | ORAL | Status: DC | PRN

## 2016-04-14 MED ORDER — BUPIVACAINE HCL (PF) 0.5 % IJ SOLN
INTRAMUSCULAR | Status: AC
  Filled 2016-04-14: qty 30

## 2016-04-14 MED ORDER — LIDOCAINE 2% (20 MG/ML) 5 ML SYRINGE
INTRAMUSCULAR | Status: DC | PRN
  Administered 2016-04-14: 60 mg via INTRAVENOUS

## 2016-04-14 MED ORDER — LACTATED RINGERS IV SOLN
INTRAVENOUS | Status: DC | PRN
  Administered 2016-04-14: 08:00:00 via INTRAVENOUS

## 2016-04-14 MED ORDER — OXYCODONE HCL 5 MG PO TABS
5.0000 mg | ORAL_TABLET | Freq: Once | ORAL | Status: AC | PRN
  Administered 2016-04-14: 5 mg via ORAL

## 2016-04-14 MED ORDER — FENTANYL CITRATE (PF) 100 MCG/2ML IJ SOLN
25.0000 ug | INTRAMUSCULAR | Status: DC | PRN
  Administered 2016-04-14: 25 ug via INTRAVENOUS

## 2016-04-14 MED ORDER — EPINEPHRINE HCL 1 MG/ML IJ SOLN
INTRAMUSCULAR | Status: DC | PRN
  Administered 2016-04-14: 1 mg

## 2016-04-14 MED ORDER — CEFAZOLIN SODIUM-DEXTROSE 2-4 GM/100ML-% IV SOLN
INTRAVENOUS | Status: AC
  Filled 2016-04-14: qty 100

## 2016-04-14 MED ORDER — CHLORHEXIDINE GLUCONATE 4 % EX LIQD
60.0000 mL | Freq: Once | CUTANEOUS | Status: DC
Start: 2016-04-14 — End: 2016-04-14

## 2016-04-14 MED ORDER — EPINEPHRINE HCL 1 MG/ML IJ SOLN
INTRAMUSCULAR | Status: AC
  Filled 2016-04-14: qty 1

## 2016-04-14 MED ORDER — FENTANYL CITRATE (PF) 100 MCG/2ML IJ SOLN
INTRAMUSCULAR | Status: DC
Start: 2016-04-14 — End: 2016-04-14
  Filled 2016-04-14: qty 2

## 2016-04-14 MED ORDER — SODIUM CHLORIDE 0.9 % IR SOLN
Status: DC | PRN
  Administered 2016-04-14: 3000 mL

## 2016-04-14 MED ORDER — BUPIVACAINE HCL (PF) 0.25 % IJ SOLN
INTRAMUSCULAR | Status: AC
  Filled 2016-04-14: qty 30

## 2016-04-14 SURGICAL SUPPLY — 37 items
BANDAGE ELASTIC 6 VELCRO ST LF (GAUZE/BANDAGES/DRESSINGS) ×2 IMPLANT
BLADE CUDA 5.5 (BLADE) IMPLANT
BLADE CUTTER GATOR 3.5 (BLADE) IMPLANT
BLADE GREAT WHITE 4.2 (BLADE) ×2 IMPLANT
BNDG GAUZE ELAST 4 BULKY (GAUZE/BANDAGES/DRESSINGS) ×1 IMPLANT
CUFF TOURNIQUET SINGLE 34IN LL (TOURNIQUET CUFF) IMPLANT
CUFF TOURNIQUET SINGLE 44IN (TOURNIQUET CUFF) IMPLANT
DRAPE ARTHROSCOPY W/POUCH 114 (DRAPES) ×2 IMPLANT
DRAPE PROXIMA HALF (DRAPES) ×2 IMPLANT
DRAPE U-SHAPE 47X51 STRL (DRAPES) ×2 IMPLANT
DRSG EMULSION OIL 3X3 NADH (GAUZE/BANDAGES/DRESSINGS) ×2 IMPLANT
DRSG PAD ABDOMINAL 8X10 ST (GAUZE/BANDAGES/DRESSINGS) ×1 IMPLANT
DURAPREP 26ML APPLICATOR (WOUND CARE) ×2 IMPLANT
FILTER STRAW FLUID ASPIR (MISCELLANEOUS) ×2 IMPLANT
GAUZE SPONGE 4X4 12PLY STRL (GAUZE/BANDAGES/DRESSINGS) ×2 IMPLANT
GLOVE BIOGEL PI IND STRL 8 (GLOVE) ×2 IMPLANT
GLOVE BIOGEL PI INDICATOR 8 (GLOVE) ×2
GLOVE ECLIPSE 7.5 STRL STRAW (GLOVE) ×4 IMPLANT
GOWN STRL REUS W/ TWL LRG LVL3 (GOWN DISPOSABLE) ×1 IMPLANT
GOWN STRL REUS W/ TWL XL LVL3 (GOWN DISPOSABLE) ×2 IMPLANT
GOWN STRL REUS W/TWL LRG LVL3 (GOWN DISPOSABLE) ×2
GOWN STRL REUS W/TWL XL LVL3 (GOWN DISPOSABLE) ×4
KIT BASIN OR (CUSTOM PROCEDURE TRAY) ×2 IMPLANT
KIT ROOM TURNOVER OR (KITS) ×2 IMPLANT
NDL 18GX1X1/2 (RX/OR ONLY) (NEEDLE) ×1 IMPLANT
NEEDLE 18GX1X1/2 (RX/OR ONLY) (NEEDLE) IMPLANT
PACK ARTHROSCOPY DSU (CUSTOM PROCEDURE TRAY) ×2 IMPLANT
PAD ARMBOARD 7.5X6 YLW CONV (MISCELLANEOUS) ×3 IMPLANT
PAD CAST 4YDX4 CTTN HI CHSV (CAST SUPPLIES) ×2 IMPLANT
PADDING CAST COTTON 4X4 STRL (CAST SUPPLIES)
SET ARTHROSCOPY TUBING (MISCELLANEOUS) ×2
SET ARTHROSCOPY TUBING LN (MISCELLANEOUS) ×1 IMPLANT
SUT ETHILON 4 0 PS 2 18 (SUTURE) ×1 IMPLANT
SYR 5ML LL (SYRINGE) ×2 IMPLANT
TOWEL OR 17X24 6PK STRL BLUE (TOWEL DISPOSABLE) ×2 IMPLANT
TOWEL OR 17X26 10 PK STRL BLUE (TOWEL DISPOSABLE) ×2 IMPLANT
WRAP KNEE MAXI GEL POST OP (GAUZE/BANDAGES/DRESSINGS) ×2 IMPLANT

## 2016-04-14 NOTE — Anesthesia Preprocedure Evaluation (Signed)
Anesthesia Evaluation  Patient identified by MRN, date of birth, ID band Patient awake    Reviewed: Allergy & Precautions, H&P , NPO status , Patient's Chart, lab work & pertinent test results  Airway Mallampati: II   Neck ROM: full    Dental   Pulmonary former smoker,    breath sounds clear to auscultation       Cardiovascular hypertension, +CHF  + dysrhythmias + Cardiac Defibrillator  Rhythm:regular Rate:Normal  Cleared for surgery by cardiology.  EF has normalized based on the most recent TTE (EF 50%).   Neuro/Psych    GI/Hepatic   Endo/Other  diabetes, Type 2  Renal/GU      Musculoskeletal   Abdominal   Peds  Hematology   Anesthesia Other Findings   Reproductive/Obstetrics                             Anesthesia Physical Anesthesia Plan  ASA: III  Anesthesia Plan: General   Post-op Pain Management:    Induction: Intravenous  Airway Management Planned: LMA  Additional Equipment:   Intra-op Plan:   Post-operative Plan:   Informed Consent: I have reviewed the patients History and Physical, chart, labs and discussed the procedure including the risks, benefits and alternatives for the proposed anesthesia with the patient or authorized representative who has indicated his/her understanding and acceptance.     Plan Discussed with: CRNA, Anesthesiologist and Surgeon  Anesthesia Plan Comments:         Anesthesia Quick Evaluation

## 2016-04-14 NOTE — Brief Op Note (Signed)
04/14/2016  9:04 AM  PATIENT:  Grace Robinson  82 y.o. female  PRE-OPERATIVE DIAGNOSIS:  LEFT KNEE MEDIAL MENISCUS TEAR  POST-OPERATIVE DIAGNOSIS:  LEFT KNEE MEDIAL MENISCUS TEAR  PROCEDURE:  Procedure(s): LEFT KNEE ARTHROSCOPY, PARTIAL MEDIAL AND PARTIAL LATERAL MENISCECTOMY, MEDIAL PLICA (Left)  SURGEON:  Surgeon(s) and Role:    * Dorna Leitz, MD - Primary  PHYSICIAN ASSISTANT:   ASSISTANTS: bethune   ANESTHESIA:   general  EBL:     BLOOD ADMINISTERED:none  DRAINS: none   LOCAL MEDICATIONS USED:  MARCAINE     SPECIMEN:  No Specimen  DISPOSITION OF SPECIMEN:  N/A  COUNTS:  YES  TOURNIQUET:  * No tourniquets in log *  DICTATION: .Other Dictation: Dictation Number [id number]  PLAN OF CARE: Discharge to home after PACU  PATIENT DISPOSITION:  PACU - hemodynamically stable.   Delay start of Pharmacological VTE agent (>24hrs) due to surgical blood loss or risk of bleeding: no

## 2016-04-14 NOTE — Op Note (Signed)
NAMELIOR, BURBANK NO.:  [id number]  MEDICAL RECORD NO.:  [id number]  LOCATION:  [facility]                         FACILITY:  [facility]  PHYSICIAN:  Alta Corning, M.D.   DATE OF BIRTH:  Aug 31, 1926  DATE OF PROCEDURE:  04/14/2016 DATE OF DISCHARGE:                              OPERATIVE REPORT   PREOPERATIVE DIAGNOSIS:  Medial meniscal tear.  POSTOPERATIVE DIAGNOSES: 1. Medial meniscal tear. 2. Lateral meniscal tear. 3. Large medial shelf plica.  PROCEDURES: 1. Partial medial and partial lateral meniscectomies on the medial     side.  It was probably 25% of the posterior horn of the medial     meniscus on the lateral side with a small radial tear. 2. Medial plica excision of a large medial draping plica.  SURGEON:  Alta Corning, M.D.  ASSISTANT:  Gary Fleet, P.A.  ANESTHESIA:  General.  BRIEF HISTORY:  Grace Robinson deep in an 82 year old female with a long history of significant complaints of left knee pain.  She has been treated conservatively for prolonged period of time and after failure of conservative care, she was taken to the operating room for left knee arthroscopy.  DESCRIPTION OF PROCEDURE:  The patient was taken to the operating room. After adequate anesthesia was obtained with general anesthetic, the patient was placed supine on the operating room table.  Left leg was prepped and draped in usual sterile fashion.  Following this, routine arthroscopic examination of the left knee was evaluated.  There was a large draping medial shelf plica, which blocked access into the medial compartment.  We had to make a portal line and resect the medial plica to gain access into the medial compartment.  Once this was resected, we could see the patellofemoral joint had no significant area of chondromalacia.  Medial femoral condyle had mild chondromalacia, but there was a large flap on the posterior horn of the medial meniscus. This was debrided back to a  smooth and stable rim.  The ACL was normal. Attention was turned lateral.  There was a small radial tear, which was debrided.  Lateral femoral condyle had significant chondromalacia.  At this point, the knee was copiously and thoroughly lavaged and suctioned dry.  Final check was made.  Medial meniscus was now stable and no evidence of instability.  At this point, the knee was irrigated, suctioned dry.  The portals were closed with a bandage.  Sterile compressive dressing was applied after 20 mL of 0.25% Marcaine was instilled in the knee for postoperative anesthesia.     Alta Corning, M.D.     Grace Robinson  D:  04/14/2016  T:  04/14/2016  Job:  [id number]

## 2016-04-14 NOTE — Transfer of Care (Signed)
Immediate Anesthesia Transfer of Care Note  Patient: Grace Robinson  Procedure(s) Performed: Procedure(s): LEFT KNEE ARTHROSCOPY, PARTIAL MEDIAL AND PARTIAL LATERAL MENISCECTOMY, MEDIAL PLICA (Left)  Patient Location: PACU  Anesthesia Type:General  Level of Consciousness: awake, alert  and oriented  Airway & Oxygen Therapy: Patient Spontanous Breathing  Post-op Assessment: Report given to RN and Post -op Vital signs reviewed and stable  Post vital signs: Reviewed and stable  Last Vitals:  Filed Vitals:   04/14/16 0710  BP: 171/72  Pulse: 66  Temp: 36.9 C  Resp: 20    Last Pain: There were no vitals filed for this visit.    Patients Stated Pain Goal: 3 ([phone])  Complications: No apparent anesthesia complications

## 2016-04-14 NOTE — H&P (Signed)
PREOPERATIVE H&P  Chief Complaint: left knee pain  HPI: Grace Robinson is a 82 y.o. female who presents for evaluation of  left knee pain. It has been present for several months and has been worsening. She has catching, locking, and grabbing pain.  She has difficulty getting in and out of a car.She has failed conservative measures. Pain is rated as moderate.  Past Medical History  Diagnosis Date  . Ischemic cardiomyopathy   . Biventricular ICD (implantable cardiac defibrillator) in place   . HTN (hypertension)   . Dyslipidemia   . Ulcerative colitis   . Meniere's disease   . LBBB (left bundle branch block)     chronic  . Colon polyps   . Cellulitis   . T wave oversensing 01/17/2014  . Diabetes mellitus     Type II  . CHF (congestive heart failure) ([place])   . Varicose veins     4 lazer  treatment each leg  . HOH (hard of hearing)     left   Past Surgical History  Procedure Laterality Date  . Cardiac catheterization  08/2011  . Pacemaker insertion      Medtronic, remote- no.   . Abdominal hysterectomy    . Tonsillectomy and adenoidectomy    . Ep study  08/22/08  . Abdominal hysterectomy    . Mastoid sugery      in the setting of Meniere's disease  . Pericardial effusion  12/2002  . Implantable cardioverter defibrillator generator change N/A 08/22/2013    Procedure: IMPLANTABLE CARDIOVERTER DEFIBRILLATOR GENERATOR CHANGE;  Surgeon: Deboraha Sprang, MD;  Location: [facility] CATH LAB;  Service: Cardiovascular;  Laterality: N/A;  . Cholecystectomy    . Colonoscopy     Social History   Social History  . Marital Status: Single    Spouse Name: N/A  . Number of Children: N/A  . Years of Education: N/A   Social History Main Topics  . Smoking status: Former Smoker -- 1 years  . Smokeless tobacco: None     Comment: quit in 1970  . Alcohol Use: No  . Drug Use: No  . Sexual Activity: Not Currently   Other Topics Concern  . None   Social History Narrative   Widowed, has 2  children.    Family History  Problem Relation Age of Onset  . Emphysema Father 18  . Cancer Mother 11   Allergies  Allergen Reactions  . Carvedilol   . Sudafed [Pseudoephedrine]     Makes skin feel like its crawling  . Vancomycin    Prior to Admission medications   Medication Sig Start Date End Date Taking? Authorizing Provider  amLODipine (NORVASC) 5 MG tablet Take 5-10 mg by mouth daily. (5mg  ) four days a week (10mg  ) three days a week   Yes Historical Provider, MD  aspirin EC 81 MG tablet Take 1 tablet (81 mg total) by mouth daily. 07/20/11  Yes Deboraha Sprang, MD  atorvastatin (LIPITOR) 40 MG tablet Take 40 mg by mouth daily. 04/21/15  Yes Historical Provider, MD  Biotin 1 MG CAPS Take 1,000 mg by mouth daily.   Yes Historical Provider, MD  Cholecalciferol (VITAMIN D3) 1000 units CAPS Take 2 capsules by mouth daily.   Yes Historical Provider, MD  furosemide (LASIX) 20 MG tablet Alternate taking 20 mg one day, take 40 mg the next day - repeating this pattern. 01/17/14  Yes Deboraha Sprang, MD  HUMALOG MIX 75/25 (75-25) 100 UNIT/ML SUSP injection  Inject 15 Units into the skin 2 (two) times daily with a meal. Over 200 take (45 units) break up in three part to take 3 times daily 04/21/15  Yes Historical Provider, MD  losartan (COZAAR) 100 MG tablet Take 100 mg by mouth daily.   Yes Historical Provider, MD  metoprolol succinate (TOPROL-XL) 100 MG 24 hr tablet Take 1 tablet by mouth 2 (two) times daily. 07/04/13  Yes Historical Provider, MD  potassium chloride (K-DUR) 10 MEQ tablet Take 10 mEq by mouth daily.   Yes Historical Provider, MD  sitaGLIPtin (JANUVIA) 100 MG tablet Take 100 mg by mouth daily.   Yes Historical Provider, MD     Positive ROS: none  All other systems have been reviewed and were otherwise negative with the exception of those mentioned in the HPI and as above.  Physical Exam: There were no vitals filed for this visit.  General: Alert, no acute  distress Cardiovascular: No pedal edema Respiratory: No cyanosis, no use of accessory musculature GI: No organomegaly, abdomen is soft and non-tender Skin: No lesions in the area of chief complaint Neurologic: Sensation intact distally Psychiatric: Patient is competent for consent with normal mood and affect Lymphatic: No axillary or cervical lymphadenopathy  MUSCULOSKELETAL: left knee: Positive McMurray.  No instability.  Trace effusion.  Significant pain with flexion extension and palpation over the medial joint line.  MRI: MRI shows medial meniscal tear and moderate chondromalacia.  Assessment/Plan: LEFT KNEE MEDIAL MENISCUS TEAR Plan for Procedure(s): ARTHROSCOPY KNEE  The risks benefits and alternatives were discussed with the patient including but not limited to the risks of nonoperative treatment, versus surgical intervention including infection, bleeding, nerve injury, malunion, nonunion, hardware prominence, hardware failure, need for hardware removal, blood clots, cardiopulmonary complications, morbidity, mortality, among others, and they were willing to proceed.  Predicted outcome is good, although there will be at least a six to nine month expected recovery.  Mackay Hanauer L, MD 04/14/2016 7:05 AM

## 2016-04-14 NOTE — Discharge Instructions (Signed)
POST-OP KNEE ARTHROSCOPY INSTRUCTIONS  °Dr. John Graves/Jim Kathlean Cinco PA-C ° °Pain °You will be expected to have a moderate amount of pain in the affected knee for approximately two weeks. However, the first two days will be the most severe pain. A prescription has been provided to take as needed for the pain. The pain can be reduced by applying ice packs to the knee for the first 1-2 weeks post surgery. Also, keeping the leg elevated on pillows will help alleviate the pain. If you develop any acute pain or swelling in your calf muscle, please call the doctor. ° °Activity °It is preferred that you stay at bed rest for approximately 24 hours. However, you may go to the bathroom with help. Weight bearing as tolerated. You may begin the knee exercises the day of surgery. Discontinue crutches as the knee pain resolves. ° °Dressing °Keep the dressing dry. If the ace bandage should wrinkle or roll up, this can be rewrapped to prevent ridges in the bandage. You may remove all dressings in 48 hours,  apply bandaids to each wound. You may shower on the 4th day after surgery but no tub bath. ° °Symptoms to report to your doctor °Extreme pain °Extreme swelling °Temperature above 101 degrees °Change in the feeling, color, or movement of your toes °Redness, heat, or swelling at your incision ° °Exercise °If is preferred that as soon as possible you try to do a straight leg raise without bending the knee and concentrate on bringing the heel of your foot off the bed up to approximately 45 degrees and hold for the count of 10 seconds. Repeat this at least 10 times three or four times per day. Additional exercises are provided below. ° °You are encouraged to bend the knee as tolerated. ° °Follow-Up °Call to schedule a follow-up appointment in 5-7 days. Our office # is [phone]. ° °POST-OP EXERCISES ° °Short Arc Quads ° °1. Lie on back with legs straight. Place towel roll under thigh, just above knee. °2. Tighten thigh muscles to  straighten knee and lift heel off bed. °3. Hold for slow count of five, then lower. °4. Do three sets of ten ° ° ° °Straight Leg Raises ° °1. Lie on back with operative leg straight and other leg bent. °2. Keeping operative leg completely straight, slowly lift operative leg so foot is 5 inches off bed. °3. Hold for slow count of five, then lower. °4. Do three sets of ten. ° ° ° °DO BOTH EXERCISES 2 TIMES A DAY ° °Ankle Pumps ° °Work/move the operative ankle and foot up and down 10 times every hour while awake. °

## 2016-04-14 NOTE — Anesthesia Procedure Notes (Signed)
Procedure Name: LMA Insertion Date/Time: 04/14/2016 8:35 AM Performed by: Manuela Schwartz B Pre-anesthesia Checklist: Patient identified, Emergency Drugs available, Suction available, Patient being monitored and Timeout performed Patient Re-evaluated:Patient Re-evaluated prior to inductionOxygen Delivery Method: Circle system utilized Preoxygenation: Pre-oxygenation with 100% oxygen Intubation Type: IV induction LMA: LMA inserted LMA Size: 4.0 Number of attempts: 1 Placement Confirmation: positive ETCO2 and breath sounds checked- equal and bilateral Tube secured with: Tape Dental Injury: Teeth and Oropharynx as per pre-operative assessment

## 2016-04-15 ENCOUNTER — Encounter (HOSPITAL_COMMUNITY): Payer: Self-pay | Admitting: Orthopedic Surgery

## 2016-04-20 NOTE — Anesthesia Postprocedure Evaluation (Signed)
Anesthesia Post Note  Patient: Grace Robinson  Procedure(s) Performed: Procedure(s) (LRB): LEFT KNEE ARTHROSCOPY, PARTIAL MEDIAL AND PARTIAL LATERAL MENISCECTOMY, MEDIAL PLICA (Left)  Patient location during evaluation: PACU Anesthesia Type: General Level of consciousness: awake and alert and patient cooperative Pain management: pain level controlled Vital Signs Assessment: post-procedure vital signs reviewed and stable Respiratory status: spontaneous breathing and respiratory function stable Cardiovascular status: stable Anesthetic complications: no    Last Vitals:  Filed Vitals:   04/14/16 0939 04/14/16 0955  BP: 136/70 156/56  Pulse: 60 57  Temp:    Resp: 10 18    Last Pain:  Filed Vitals:   04/16/16 1406  PainSc: 8                  Jamiere Gulas S

## 2016-04-27 DIAGNOSIS — M25562 Pain in left knee: Secondary | ICD-10-CM | POA: Diagnosis not present

## 2016-04-27 DIAGNOSIS — M25662 Stiffness of left knee, not elsewhere classified: Secondary | ICD-10-CM | POA: Diagnosis not present

## 2016-04-28 DIAGNOSIS — M25562 Pain in left knee: Secondary | ICD-10-CM | POA: Diagnosis not present

## 2016-04-28 DIAGNOSIS — M25662 Stiffness of left knee, not elsewhere classified: Secondary | ICD-10-CM | POA: Diagnosis not present

## 2016-04-30 DIAGNOSIS — E1151 Type 2 diabetes mellitus with diabetic peripheral angiopathy without gangrene: Secondary | ICD-10-CM | POA: Diagnosis not present

## 2016-04-30 DIAGNOSIS — L84 Corns and callosities: Secondary | ICD-10-CM | POA: Diagnosis not present

## 2016-04-30 DIAGNOSIS — I739 Peripheral vascular disease, unspecified: Secondary | ICD-10-CM | POA: Diagnosis not present

## 2016-04-30 DIAGNOSIS — L603 Nail dystrophy: Secondary | ICD-10-CM | POA: Diagnosis not present

## 2016-05-03 DIAGNOSIS — M25662 Stiffness of left knee, not elsewhere classified: Secondary | ICD-10-CM | POA: Diagnosis not present

## 2016-05-03 DIAGNOSIS — M25562 Pain in left knee: Secondary | ICD-10-CM | POA: Diagnosis not present

## 2016-05-06 DIAGNOSIS — M25662 Stiffness of left knee, not elsewhere classified: Secondary | ICD-10-CM | POA: Diagnosis not present

## 2016-05-06 DIAGNOSIS — M25562 Pain in left knee: Secondary | ICD-10-CM | POA: Diagnosis not present

## 2016-05-10 DIAGNOSIS — M25562 Pain in left knee: Secondary | ICD-10-CM | POA: Diagnosis not present

## 2016-05-10 DIAGNOSIS — M25662 Stiffness of left knee, not elsewhere classified: Secondary | ICD-10-CM | POA: Diagnosis not present

## 2016-05-12 DIAGNOSIS — M25662 Stiffness of left knee, not elsewhere classified: Secondary | ICD-10-CM | POA: Diagnosis not present

## 2016-05-12 DIAGNOSIS — M25562 Pain in left knee: Secondary | ICD-10-CM | POA: Diagnosis not present

## 2016-05-13 ENCOUNTER — Ambulatory Visit (INDEPENDENT_AMBULATORY_CARE_PROVIDER_SITE_OTHER): Payer: Medicare Other | Admitting: *Deleted

## 2016-05-13 ENCOUNTER — Ambulatory Visit: Payer: Medicare Other

## 2016-05-13 DIAGNOSIS — I428 Other cardiomyopathies: Secondary | ICD-10-CM

## 2016-05-13 DIAGNOSIS — I5022 Chronic systolic (congestive) heart failure: Secondary | ICD-10-CM

## 2016-05-13 DIAGNOSIS — Z95810 Presence of automatic (implantable) cardiac defibrillator: Secondary | ICD-10-CM | POA: Diagnosis not present

## 2016-05-13 DIAGNOSIS — I429 Cardiomyopathy, unspecified: Secondary | ICD-10-CM | POA: Diagnosis not present

## 2016-05-13 NOTE — Progress Notes (Signed)
Remote ICD transmission.   

## 2016-05-14 LAB — CUP PACEART REMOTE DEVICE CHECK
Brady Statistic AP VS Percent: 0.4 %
Brady Statistic AS VS Percent: 1.05 %
Date Time Interrogation Session: 20170713071606
HIGH POWER IMPEDANCE MEASURED VALUE: 67 Ohm
HighPow Impedance: 52 Ohm
Implantable Lead Implant Date: 20031007
Implantable Lead Location: 753858
Implantable Lead Location: 753859
Implantable Lead Model: 4193
Implantable Lead Model: 4469
Implantable Lead Serial Number: 337988
Lead Channel Impedance Value: 342 Ohm
Lead Channel Impedance Value: 456 Ohm
Lead Channel Impedance Value: 456 Ohm
Lead Channel Impedance Value: 475 Ohm
Lead Channel Pacing Threshold Amplitude: 0.625 V
Lead Channel Pacing Threshold Pulse Width: 0.4 ms
Lead Channel Sensing Intrinsic Amplitude: 1.625 mV
Lead Channel Sensing Intrinsic Amplitude: 25.25 mV
MDC IDC LEAD IMPLANT DT: 20031007
MDC IDC LEAD IMPLANT DT: 20031007
MDC IDC LEAD IMPLANT DT: 20060227
MDC IDC LEAD LOCATION: 753860
MDC IDC LEAD LOCATION: 753860
MDC IDC LEAD MODEL: 4470
MDC IDC LEAD MODEL: 6949
MDC IDC LEAD SERIAL: 361236
MDC IDC MSMT BATTERY REMAINING LONGEVITY: 81 mo
MDC IDC MSMT BATTERY VOLTAGE: 2.99 V
MDC IDC MSMT LEADCHNL LV IMPEDANCE VALUE: 4047 Ohm
MDC IDC MSMT LEADCHNL LV IMPEDANCE VALUE: 4047 Ohm
MDC IDC MSMT LEADCHNL RA PACING THRESHOLD AMPLITUDE: 0.75 V
MDC IDC MSMT LEADCHNL RA PACING THRESHOLD PULSEWIDTH: 0.4 ms
MDC IDC MSMT LEADCHNL RA SENSING INTR AMPL: 1.625 mV
MDC IDC MSMT LEADCHNL RV PACING THRESHOLD AMPLITUDE: 1 V
MDC IDC MSMT LEADCHNL RV PACING THRESHOLD PULSEWIDTH: 0.4 ms
MDC IDC MSMT LEADCHNL RV SENSING INTR AMPL: 25.25 mV
MDC IDC SET LEADCHNL LV PACING AMPLITUDE: 1.75 V
MDC IDC SET LEADCHNL LV PACING PULSEWIDTH: 0.4 ms
MDC IDC SET LEADCHNL RA PACING AMPLITUDE: 2 V
MDC IDC SET LEADCHNL RV SENSING SENSITIVITY: 0.6 mV
MDC IDC STAT BRADY AP VP PERCENT: 24.24 %
MDC IDC STAT BRADY AS VP PERCENT: 74.31 %
MDC IDC STAT BRADY RA PERCENT PACED: 24.64 %
MDC IDC STAT BRADY RV PERCENT PACED: 0.36 %

## 2016-05-14 NOTE — Progress Notes (Addendum)
EPIC Encounter for ICM Monitoring  Patient Name: Grace Robinson is a 82 y.o. female Date: 05/14/2016 Primary Care Physican: Precious Reel, MD Primary Cardiologist: Truitt Merle, NP Electrophysiologist: Faustino Congress Weight: unknown Bi-V Pacing:  98.3%       Heart Failure questions reviewed, pt symptomatic with leg swelling following knee surgery on 04/14/2016.  She stated her foot was very swollen last night and unable to get her shoe on.    Thoracic impedence decreased 04/14/2016 to 05/13/2016 suggesting fluid accumulation.  Fluid accumulation stated at the time of knee surgery. She was prescribed oral prednisone after surgery and still taking it.   Recommendations: Take Lasix 40 mg daily x 4 days then return to prescribed dosage of alternating 40mg  every other day with 20 mg every other day.  Increase Potassium 10 meq to bid x 4 days and then return to prescribed dosage of 10 meq daily.   Advised to call if she has any dizziness, lightheadedness, weakness or if fluid symptoms persist.    ICM trend: 05/13/2016    Follow-up plan: ICM clinic phone appointment on 05/31/2016. Office appointment with Dr Caryl Comes 06/30/2016.  Copy of ICM check sent to device physician for review and any further recommendations.   [name], RN 05/14/2016 11:21 AM

## 2016-05-17 DIAGNOSIS — M25662 Stiffness of left knee, not elsewhere classified: Secondary | ICD-10-CM | POA: Diagnosis not present

## 2016-05-17 DIAGNOSIS — M25562 Pain in left knee: Secondary | ICD-10-CM | POA: Diagnosis not present

## 2016-05-19 ENCOUNTER — Encounter: Payer: Self-pay | Admitting: Cardiology

## 2016-05-19 DIAGNOSIS — M25562 Pain in left knee: Secondary | ICD-10-CM | POA: Diagnosis not present

## 2016-05-19 DIAGNOSIS — M25662 Stiffness of left knee, not elsewhere classified: Secondary | ICD-10-CM | POA: Diagnosis not present

## 2016-05-24 DIAGNOSIS — M25662 Stiffness of left knee, not elsewhere classified: Secondary | ICD-10-CM | POA: Diagnosis not present

## 2016-05-24 DIAGNOSIS — M25562 Pain in left knee: Secondary | ICD-10-CM | POA: Diagnosis not present

## 2016-05-31 ENCOUNTER — Telehealth: Payer: Self-pay

## 2016-05-31 ENCOUNTER — Ambulatory Visit (INDEPENDENT_AMBULATORY_CARE_PROVIDER_SITE_OTHER): Payer: Medicare Other

## 2016-05-31 DIAGNOSIS — Z95810 Presence of automatic (implantable) cardiac defibrillator: Secondary | ICD-10-CM

## 2016-05-31 DIAGNOSIS — I509 Heart failure, unspecified: Secondary | ICD-10-CM

## 2016-05-31 NOTE — Telephone Encounter (Signed)
Remote ICM transmission received.  Attempted patient call and left message for return call.   

## 2016-05-31 NOTE — Progress Notes (Signed)
EPIC Encounter for ICM Monitoring  Patient Name: Grace Robinson is a 82 y.o. female Date: 05/31/2016 Primary Care Physican: Precious Reel, MD Primary Cardiologist: Hilbert Corrigan, NP Electrophysiologist: Faustino Congress Weight: unknown Bi-V Pacing:  98.4%       Attempted patient call and unable to reach.  Transmission reviewed.   Repeat transmission on 06/09/2016.  Thoracic impedance decreased suggesting fluid accumulation 04/16/2016 to 05/11/2016 and fluid index > threshold.  Impedance below reference line 05/13/2016 to 05/31/2016 with 2 days at baseline.   ICM trend: 05/31/2016     Follow-up plan: ICM clinic phone appointment on 06/09/2016.  Copy of ICM check sent to device physician.   [name], RN 05/31/2016 11:26 AM

## 2016-06-03 ENCOUNTER — Telehealth: Payer: Self-pay | Admitting: Internal Medicine

## 2016-06-03 NOTE — Telephone Encounter (Signed)
F/u Message   Pt returning RN call. Please call back to disucss

## 2016-06-03 NOTE — Progress Notes (Signed)
Patient returned call.   Heart Failure questions reviewed, pt asymptomatic.  She is feeling well  Reviewed thoracic impedance which suggested some fluid accumulation but is close to baseline.    Recommendations: No changes.  Advised to monitor salt intake and limit to 2000 mg a day.  She stated she has been eating out more and explained restaurant food is very high in sodium.  Encouraged to call if she experiences any fluid symptoms.

## 2016-06-03 NOTE — Telephone Encounter (Signed)
Spoke with patient.

## 2016-06-09 ENCOUNTER — Ambulatory Visit (INDEPENDENT_AMBULATORY_CARE_PROVIDER_SITE_OTHER): Payer: Medicare Other

## 2016-06-09 DIAGNOSIS — I509 Heart failure, unspecified: Secondary | ICD-10-CM

## 2016-06-09 DIAGNOSIS — Z95810 Presence of automatic (implantable) cardiac defibrillator: Secondary | ICD-10-CM

## 2016-06-09 NOTE — Progress Notes (Signed)
EPIC Encounter for ICM Monitoring  Patient Name: Grace Robinson is a 82 y.o. female Date: 06/09/2016 Primary Care Physican: Precious Reel, MD Primary Cardiologist: Hilbert Corrigan, NP Electrophysiologist: Faustino Congress Weight: unknown Bi-V Pacing:  98.4%       Heart Failure questions reviewed, pt asymptomatic   Thoracic impedance abnormal suggesting fluid accumulation since 06/05/2016.  Recommendations:  No changes.  She stated she does not always follow low sodium diet.    ICM trend: 06/09/2016           Follow-up plan: ICM clinic phone appointment on 07/07/2016.  She reported she needs to reschedule office appointment that she has with Dr Caryl Comes on 06/30/2016.  Message sent to scheduler to call patient to reschedule.   Copy of ICM check sent to device physician.   [name], RN 06/09/2016 2:52 PM

## 2016-06-25 DIAGNOSIS — I130 Hypertensive heart and chronic kidney disease with heart failure and stage 1 through stage 4 chronic kidney disease, or unspecified chronic kidney disease: Secondary | ICD-10-CM | POA: Diagnosis not present

## 2016-06-25 DIAGNOSIS — Z794 Long term (current) use of insulin: Secondary | ICD-10-CM | POA: Diagnosis not present

## 2016-06-25 DIAGNOSIS — M25561 Pain in right knee: Secondary | ICD-10-CM | POA: Diagnosis not present

## 2016-06-25 DIAGNOSIS — Z950 Presence of cardiac pacemaker: Secondary | ICD-10-CM | POA: Diagnosis not present

## 2016-06-25 DIAGNOSIS — M25562 Pain in left knee: Secondary | ICD-10-CM | POA: Diagnosis not present

## 2016-06-25 DIAGNOSIS — Z6821 Body mass index (BMI) 21.0-21.9, adult: Secondary | ICD-10-CM | POA: Diagnosis not present

## 2016-06-25 DIAGNOSIS — E1122 Type 2 diabetes mellitus with diabetic chronic kidney disease: Secondary | ICD-10-CM | POA: Diagnosis not present

## 2016-06-25 DIAGNOSIS — Z1389 Encounter for screening for other disorder: Secondary | ICD-10-CM | POA: Diagnosis not present

## 2016-06-25 DIAGNOSIS — R6889 Other general symptoms and signs: Secondary | ICD-10-CM | POA: Diagnosis not present

## 2016-06-25 DIAGNOSIS — K529 Noninfective gastroenteritis and colitis, unspecified: Secondary | ICD-10-CM | POA: Diagnosis not present

## 2016-06-30 ENCOUNTER — Encounter: Payer: Medicare Other | Admitting: Internal Medicine

## 2016-07-07 ENCOUNTER — Ambulatory Visit (INDEPENDENT_AMBULATORY_CARE_PROVIDER_SITE_OTHER): Payer: Medicare Other

## 2016-07-07 DIAGNOSIS — Z95810 Presence of automatic (implantable) cardiac defibrillator: Secondary | ICD-10-CM

## 2016-07-07 DIAGNOSIS — I509 Heart failure, unspecified: Secondary | ICD-10-CM | POA: Diagnosis not present

## 2016-07-07 NOTE — Progress Notes (Signed)
EPIC Encounter for ICM Monitoring  Patient Name: Grace Robinson is a 82 y.o. female Date: 07/07/2016 Primary Care Physican: Precious Reel, MD Primary Cardiologist: Hilbert Corrigan, NP Electrophysiologist: Faustino Congress Weight: unknown Bi-V Pacing:  98.4%       Heart Failure questions reviewed, pt symptomatic with ankle/feet swelling.  She reported completion of labs at Dr Virgina Jock in the last week.    Thoracic impedance abnormal suggesting fluid accumulation.  Recommendations: Increase Furosemide to 40 mg daily x 4 days then return to prescribed dosage of Furosemide 20 mg - 2 tablets every other day alternating with 20 mg.  Advised to increase Potassium 10 mEq 3 tablets x 4 days and then return to prescribed dosage of 2 tablets daily.  Dr Virgina Jock increase her Potassium from 1 to 2 tablets in the past week.    Follow-up plan: ICM clinic phone appointment on 09/14/2016.  Office appointment with Dr Caryl Comes 08/11/2016.  Copy of ICM check sent to primary cardiologist and device physician.   ICM trend: 07/07/2016      Grace Billings, RN 07/07/2016 9:04 AM

## 2016-07-08 ENCOUNTER — Telehealth: Payer: Self-pay

## 2016-07-08 NOTE — Telephone Encounter (Signed)
Call to Dr Keane Police office and spoke with medical records.  Will fax latest lab results and provided fax number.

## 2016-07-15 ENCOUNTER — Ambulatory Visit (INDEPENDENT_AMBULATORY_CARE_PROVIDER_SITE_OTHER): Payer: Medicare Other

## 2016-07-15 ENCOUNTER — Telehealth: Payer: Self-pay

## 2016-07-15 DIAGNOSIS — Z95810 Presence of automatic (implantable) cardiac defibrillator: Secondary | ICD-10-CM

## 2016-07-15 DIAGNOSIS — I509 Heart failure, unspecified: Secondary | ICD-10-CM

## 2016-07-15 NOTE — Telephone Encounter (Signed)
Remote ICM transmission received.  Attempted patient call and left message for return call.   

## 2016-07-15 NOTE — Progress Notes (Signed)
EPIC Encounter for ICM Monitoring  Patient Name: Grace Robinson is a 82 y.o. female Date: 07/15/2016 Primary Care Physican: Precious Reel, MD Primary Cardiologist:Hochrein/Lori Servando Snare, NP Electrophysiologist: Faustino Congress Weight:  unknown Bi-V Pacing:  98.5%       Attempted ICM call and unable to reach.  Transmission reviewed.   Thoracic impedance returned to normal after increase in Furosemide x 3 days.  Follow-up plan: ICM clinic phone appointment on 09/14/2016.  Office appointment with Dr Caryl Comes 08/11/2016  Copy of ICM check sent to primary cardiologist and device physician.   ICM trend: 07/15/2016       [name], RN 07/15/2016 3:15 PM

## 2016-07-29 ENCOUNTER — Ambulatory Visit (INDEPENDENT_AMBULATORY_CARE_PROVIDER_SITE_OTHER): Payer: Medicare Other

## 2016-07-29 ENCOUNTER — Telehealth: Payer: Self-pay

## 2016-07-29 DIAGNOSIS — Z95810 Presence of automatic (implantable) cardiac defibrillator: Secondary | ICD-10-CM

## 2016-07-29 DIAGNOSIS — I509 Heart failure, unspecified: Secondary | ICD-10-CM

## 2016-07-29 NOTE — Telephone Encounter (Signed)
Received call from patient.  She reported leg and feet swelling for the last month even after doubling furosemide for the last several weeks.  She reported sometimes the legs are so swollen she cannot get her shoes on comfortably and leg look shiny.  Advise to send remote transmission.  See ICM note.

## 2016-07-29 NOTE — Telephone Encounter (Signed)
error 

## 2016-07-29 NOTE — Progress Notes (Signed)
EPIC Encounter for ICM Monitoring  Patient Name: Grace Robinson is a 82 y.o. female Date: 07/29/2016 Primary Care Physican: Precious Reel, MD Primary Cardiologist:Hochrein/Lori Servando Snare, NP Electrophysiologist: Caryl Comes Dry Weight: unknown Bi-V Pacing:  98.4%       Received call from patient.  She has been symptomatic with swelling in her legs.  She wakes up with them swollen and then gets progressively worse throughout the day.  Normally the swelling would resolve after sleeping but now they are always swollen.  She has increased the Furosemide by 1 to 2 tablets a day for the last 3-4 weeks and swelling has not improved.    Thoracic impedance abnormal suggesting fluid accumulation.  Recommendations:  Patient agreed to appointment with Truitt Merle, NP for 07/30/2016 at 8:00AM.  Will recheck fluid levels on 08/04/2016.    Follow-up plan: ICM clinic phone appointment on 08/04/2016.  Copy of ICM check sent to NP, primary cardiologist and device physician.   ICM trend: 07/29/2016       [name], RN 07/29/2016 11:44 AM

## 2016-07-30 ENCOUNTER — Encounter (INDEPENDENT_AMBULATORY_CARE_PROVIDER_SITE_OTHER): Payer: Self-pay

## 2016-07-30 ENCOUNTER — Encounter: Payer: Self-pay | Admitting: Nurse Practitioner

## 2016-07-30 ENCOUNTER — Ambulatory Visit (INDEPENDENT_AMBULATORY_CARE_PROVIDER_SITE_OTHER): Payer: Medicare Other | Admitting: Nurse Practitioner

## 2016-07-30 VITALS — BP 118/70 | HR 70 | Ht 66.5 in | Wt 138.8 lb

## 2016-07-30 DIAGNOSIS — I429 Cardiomyopathy, unspecified: Secondary | ICD-10-CM | POA: Diagnosis not present

## 2016-07-30 DIAGNOSIS — I739 Peripheral vascular disease, unspecified: Secondary | ICD-10-CM | POA: Diagnosis not present

## 2016-07-30 DIAGNOSIS — L84 Corns and callosities: Secondary | ICD-10-CM | POA: Diagnosis not present

## 2016-07-30 DIAGNOSIS — I428 Other cardiomyopathies: Secondary | ICD-10-CM

## 2016-07-30 DIAGNOSIS — L603 Nail dystrophy: Secondary | ICD-10-CM | POA: Diagnosis not present

## 2016-07-30 DIAGNOSIS — E1151 Type 2 diabetes mellitus with diabetic peripheral angiopathy without gangrene: Secondary | ICD-10-CM | POA: Diagnosis not present

## 2016-07-30 DIAGNOSIS — I5022 Chronic systolic (congestive) heart failure: Secondary | ICD-10-CM | POA: Diagnosis not present

## 2016-07-30 DIAGNOSIS — I509 Heart failure, unspecified: Secondary | ICD-10-CM

## 2016-07-30 DIAGNOSIS — Z95810 Presence of automatic (implantable) cardiac defibrillator: Secondary | ICD-10-CM | POA: Diagnosis not present

## 2016-07-30 LAB — CBC
HCT: 39.2 % (ref 35.0–45.0)
Hemoglobin: 13.1 g/dL (ref 11.7–15.5)
MCH: 30.8 pg (ref 27.0–33.0)
MCHC: 33.4 g/dL (ref 32.0–36.0)
MCV: 92 fL (ref 80.0–100.0)
MPV: 11.1 fL (ref 7.5–12.5)
Platelets: 156 10*3/uL (ref 140–400)
RBC: 4.26 MIL/uL (ref 3.80–5.10)
RDW: 13.9 % (ref 11.0–15.0)
WBC: 6.2 10*3/uL (ref 3.8–10.8)

## 2016-07-30 LAB — HEPATIC FUNCTION PANEL
ALT: 19 U/L (ref 6–29)
AST: 22 U/L (ref 10–35)
Albumin: 4.4 g/dL (ref 3.6–5.1)
Alkaline Phosphatase: 73 U/L (ref 33–130)
Bilirubin, Direct: 0.2 mg/dL (ref <=0.2)
Indirect Bilirubin: 0.6 mg/dL (ref 0.2–1.2)
Total Bilirubin: 0.8 mg/dL (ref 0.2–1.2)
Total Protein: 6.8 g/dL (ref 6.1–8.1)

## 2016-07-30 LAB — BASIC METABOLIC PANEL
BUN: 17 mg/dL (ref 7–25)
CO2: 27 mmol/L (ref 20–31)
Calcium: 9.3 mg/dL (ref 8.6–10.4)
Chloride: 103 mmol/L (ref 98–110)
Creat: 0.81 mg/dL (ref 0.60–0.88)
Glucose, Bld: 237 mg/dL — ABNORMAL HIGH (ref 65–99)
Potassium: 3.9 mmol/L (ref 3.5–5.3)
Sodium: 142 mmol/L (ref 135–146)

## 2016-07-30 LAB — BRAIN NATRIURETIC PEPTIDE: Brain Natriuretic Peptide: 119.8 pg/mL — ABNORMAL HIGH (ref <100)

## 2016-07-30 NOTE — Patient Instructions (Addendum)
We will be checking the following labs today - BMET, BNP, HPF and CBC   Medication Instructions:    Continue with your current medicines. BUT  I am cutting the Norvasc back to just 1 tablet every day  I am increasing the Lasix to 60 mg daily for one week and then may go back to 40 mg a day    Testing/Procedures To Be Arranged:  N/A  Follow-Up:   See me in about 10 days    Other Special Instructions:   Knee high support stockings - wear every day    If you need a refill on your cardiac medications before your next appointment, please call your pharmacy.   Call the [place] office at [phone] if you have any questions, problems or concerns.

## 2016-07-30 NOTE — Progress Notes (Signed)
CARDIOLOGY OFFICE NOTE  Date:  07/30/2016    Grace Robinson Date of Birth: 01/09/1926 Medical Record [id number]  PCP:  Precious Reel, MD  Cardiologist:  Servando Snare & Hochrein/Klein    Chief Complaint  Patient presents with  . Congestive Heart Failure  . Edema    Work in visit - seen for Dr. Lorelee Cover    History of Present Illness: Grace Robinson is a 82 y.o. female who presents today for a work in visit. Seen for Dr. Percival Spanish and Dr. Caryl Comes.   She has an nonischemic CM with remote EF down to 25% that has improved with aggressive medical therapy and CRT-D. Last generator change in 2014. Other issues include HLD, IDDM, ulcerative colitis, LBBB, Meniere's disease and advanced age. Last echo back in 07/2014 showed her EF had normalized at 50 to 55%.   I last saw her back in May - she was doing well - but limited by knee pain from remote fall/accident - [place] had [place]pped [place] in the airport. Cardiac status was ok.   Called earlier this week with swelling - had doubled her Lasix with no response - transmitted her device - noted fluid accumulation - thus added to my schedule for today.   Comes back today. Here alone. She notes she has had more swelling over the past several weeks - really tries to restrict her salt. On more Norvasc - alternating doses. Hard to get her shoes on. Breathing seems about the same. Her weight is stable. No chest pain. She is on 40 mg of Lasix.    Past Medical History:  Diagnosis Date  . Biventricular ICD (implantable cardiac defibrillator) in place   . Cellulitis   . CHF (congestive heart failure) ([place])   . Colon polyps   . Diabetes mellitus    Type II  . Dyslipidemia   . HOH (hard of hearing)    left  . HTN (hypertension)   . Ischemic cardiomyopathy   . LBBB (left bundle branch block)    chronic  . Meniere's disease   . T wave oversensing 01/17/2014  . Ulcerative colitis   . Varicose veins    4 lazer  treatment each leg    Past Surgical  History:  Procedure Laterality Date  . ABDOMINAL HYSTERECTOMY    . ABDOMINAL HYSTERECTOMY    . CARDIAC CATHETERIZATION  08/2011  . CHOLECYSTECTOMY    . COLONOSCOPY    . EP Study  08/22/08  . IMPLANTABLE CARDIOVERTER DEFIBRILLATOR GENERATOR CHANGE N/A 08/22/2013   Procedure: IMPLANTABLE CARDIOVERTER DEFIBRILLATOR GENERATOR CHANGE;  Surgeon: Deboraha Sprang, MD;  [facility] C[facility] LAB;  Service: Cardiovascular;  Laterality: N/A;  . KNEE ARTHROSCOPY Left 04/14/2016   Procedure: LEFT KNEE ARTHROSCOPY, PARTIAL MEDIAL AND PARTIAL LATERAL MENISCECTOMY, MEDIAL PLICA;  Surgeon: Dorna Leitz, MD;  Location: [place];  Service: Orthopedics;  Laterality: Left;  . mastoid sugery     in the setting of Meniere's disease  . PACEMAKER INSERTION     Medtronic, remote- no.   . pericardial effusion  12/2002  . TONSILLECTOMY AND ADENOIDECTOMY       Medications: Current Outpatient Prescriptions  Medication Sig Dispense Refill  . amLODipine (NORVASC) 5 MG tablet Take 5 mg by mouth daily.    Marland Kitchen aspirin EC 81 MG tablet Take 1 tablet (81 mg total) by mouth daily.    Marland Kitchen atorvastatin (LIPITOR) 40 MG tablet Take 40 mg by mouth daily.  3  . B Complex-C-Folic Acid (EQL  SUPER B COMPLEX/VITAMIN C PO) Take 1 tablet by mouth daily.    . Biotin (BIOTIN 5000) 5 MG CAPS Take 1 capsule by mouth daily.    Marland Kitchen CALCIUM-MAG-VIT C-VIT D PO Take 1 tablet by mouth daily.    . furosemide (LASIX) 40 MG tablet Take 40 mg by mouth daily. 60 mg for one week - then back to 40 mg a day    . HUMALOG MIX 75/25 (75-25) 100 UNIT/ML SUSP injection Inject 15 Units into the skin 2 (two) times daily with a meal. Over 200 take (45 units) break up in three part to take 3 times daily  1  . losartan (COZAAR) 100 MG tablet Take 100 mg by mouth daily.    . metoprolol succinate (TOPROL-XL) 100 MG 24 hr tablet Take 1 tablet by mouth 2 (two) times daily.    . potassium chloride (K-DUR) 10 MEQ tablet Take 10 mEq by mouth. 2 tablets daily (20 mEq total)    .  sitaGLIPtin (JANUVIA) 100 MG tablet Take 100 mg by mouth daily.     No current facility-administered medications for this visit.     Allergies: Allergies  Allergen Reactions  . Carvedilol Other (See Comments)  . Vancomycin Other (See Comments)  . Sudafed [Pseudoephedrine] Other (See Comments)    Makes skin feel like its crawling    Social History: The patient  reports that she has quit smoking. She quit after 1.00 year of use. She does not have any smokeless tobacco history on file. She reports that she does not drink alcohol or use drugs.   Family History: The patient's family history includes Cancer (age of onset: 62) in her mother; Emphysema (age of onset: 46) in her father.   Review of Systems: Please see the history of present illness.   Otherwise, the review of systems is positive for none.   All other systems are reviewed and negative.   Physical Exam: VS:  BP 118/70   Pulse 70   Ht 5' 6.5" (1.689 m)   Wt 138 lb 12.8 oz (63 kg)   SpO2 100% Comment: at rest  BMI 22.07 kg/m  .  BMI Body mass index is 22.07 kg/m.  Wt Readings from Last 3 Encounters:  07/30/16 138 lb 12.8 oz (63 kg)  04/14/16 139 lb (63 kg)  03/24/16 139 lb (63 kg)    General: Pleasant. Elderly female who is alert and in no acute distress.   HEENT: Normal.  Neck: Supple, no JVD, carotid bruits, or masses noted.  Cardiac: Regular rate and rhythm. No murmurs, rubs, or gallops. +1 edema.  Respiratory:  Lungs are clear to auscultation bilaterally with normal work of breathing.  GI: Soft and nontender.  MS: No deformity or atrophy. Gait and ROM intact.  Skin: Warm and dry. Color is normal.  Neuro:  Strength and sensation are intact and no gross focal deficits noted.  Psych: Alert, appropriate and with normal affect.   LABORATORY DATA:  EKG:  EKG is not ordered today.  Lab Results  Component Value Date   WBC 6.3 04/14/2016   HGB 12.3 04/14/2016   HCT 38.4 04/14/2016   PLT 149 (L) 04/14/2016    GLUCOSE 174 (H) 04/14/2016   NA 140 04/14/2016   K 3.4 (L) 04/14/2016   CL 105 04/14/2016   CREATININE 0.81 04/14/2016   BUN 11 04/14/2016   CO2 26 04/14/2016   TSH 1.23 07/20/2011   INR 1.1 (H) 08/13/2013    BNP (last  3 results) No results for input(s): BNP in the last 8760 hours.  ProBNP (last 3 results) No results for input(s): PROBNP in the last 8760 hours.   Other Studies Reviewed Today:  Echo Study Conclusions from 07/2014  - Left ventricle: The cavity size was normal. Systolic function was normal. The estimated ejection fraction was in the range of 50% to 55%. There was an increased relative contribution of atrial contraction to ventricular filling. Doppler parameters are consistent with abnormal left ventricular relaxation (grade 1 diastolic dysfunction). - Ventricular septum: Septal motion showed paradox. These changes are consistent with right ventricular pacing. - Aortic valve: There was mild regurgitation. - Mitral valve: There is a calcified mobile density in the LV cavity most consistent with calcified redundant chordae tendinae. This was present on echo in 2014. There was mild regurgitation. - Pulmonic valve: There was trivial regurgitation.  Assessment / Plan: 1. Volume overload - she has more swelling but weight is stable. Check lab today to include BNP. Cutting Norvasc back to one every day. Increase Lasix to 60 mg for one week - then back to 40 mg a day. Support stockings. May need to consider updating her echo but will see how she responds with medicine changes first.   2. NICM - with normalization of her EF - 50 to 55% by echo from September of 2015   3. Underlying CRT-D - followed by Dr. Caryl Comes  4. HTN - BP looks good on her current regimen. Cutting Norvasc back today  5. Advanced age - overall, she does remarkably well.   Current medicines are reviewed with the patient today.  The patient does not have concerns regarding medicines  other than what has been noted above.  The following changes have been made:  See above.  Labs/ tests ordered today include:    Orders Placed This Encounter  Procedures  . Basic metabolic panel  . CBC  . Hepatic function panel  . Brain natriuretic peptide     Disposition:   FU with me in about 10 days.   Patient is agreeable to this plan and will call if any problems develop in the interim.   Signed: Burtis Junes, RN, ANP-C 07/30/2016 8:28 AM  [facility] [address] [place], [place]  [address] Phone: [phone] Fax: [phone]

## 2016-08-04 ENCOUNTER — Ambulatory Visit (INDEPENDENT_AMBULATORY_CARE_PROVIDER_SITE_OTHER): Payer: Medicare Other

## 2016-08-04 ENCOUNTER — Telehealth: Payer: Self-pay

## 2016-08-04 DIAGNOSIS — Z95810 Presence of automatic (implantable) cardiac defibrillator: Secondary | ICD-10-CM

## 2016-08-04 DIAGNOSIS — I509 Heart failure, unspecified: Secondary | ICD-10-CM

## 2016-08-04 NOTE — Telephone Encounter (Signed)
Remote ICM transmission received.  Attempted patient call and left message to return call.   

## 2016-08-04 NOTE — Progress Notes (Signed)
EPIC Encounter for ICM Monitoring  Patient Name: Grace Robinson is a 82 y.o. female Date: 08/04/2016 Primary Care Physican: Precious Reel, MD Primary Cardiologist:Hochrein/Lori Servando Snare, NP Electrophysiologist: Caryl Comes Dry Weight: unknown Bi-V Pacing:  98.4%       Attempted ICM call and unable to reach.  Transmission reviewed.   Thoracic impedance returned to normal after Lasix increase to 60 mg for one week as recommended 07/30/2016.  Follow-up plan: ICM clinic phone appointment on 08/10/2016 to recheck fluid levels.  Copy of ICM check sent to primary cardiologist and device physician.   ICM trend: 08/04/2016       Grace Billings, RN 08/04/2016 2:40 PM

## 2016-08-06 NOTE — Progress Notes (Signed)
Patient returned call.  She stated the leg and feet swelling have improved but her feet are still puffy when getting out of bed in the mornings.  As the day progresses so does the swelling in her legs.  She c/o  bilateral knee pain and which has been ongoing for the last couple of months.  She stated she feels very fatigued. Novasc was decreased to one a day at the office visit with Truitt Merle, NP on 07/30/2016 to see if that would decrease the leg swelling but patient does not think it has made much of a difference.   Reviewed transmission and fluid levels appears normal.  She has a follow up appointment with Truitt Merle, NP on 08/11/2016.   She stated the appointment with Dr Caryl Comes was canceled but she is unsure why.  Advised the appointment with Dr Caryl Comes is in January.

## 2016-08-10 ENCOUNTER — Ambulatory Visit: Payer: Medicare Other | Admitting: Nurse Practitioner

## 2016-08-10 ENCOUNTER — Ambulatory Visit (INDEPENDENT_AMBULATORY_CARE_PROVIDER_SITE_OTHER): Payer: Medicare Other

## 2016-08-10 DIAGNOSIS — I509 Heart failure, unspecified: Secondary | ICD-10-CM

## 2016-08-10 DIAGNOSIS — Z95810 Presence of automatic (implantable) cardiac defibrillator: Secondary | ICD-10-CM

## 2016-08-10 NOTE — Progress Notes (Signed)
CARDIOLOGY OFFICE NOTE  Date:  08/11/2016    Grace Robinson Date of Birth: [birth year] Medical Record [id number]  PCP:  Precious Reel, MD  Cardiologist:  [place]    Chief Complaint  Patient presents with  . Edema    Follow up visit - seen for Dr. Tylene Fantasia    History of Present Illness: Grace Robinson is a 82 y.o. female who presents today for a follow up visit. Seen for Dr. Percival Spanish and Dr. Caryl Comes.   She has an nonischemic CM with remote EF down to 25% that has improved with aggressive medical therapy and CRT-D. Last generator change in 2014. Other issues include HLD, IDDM, ulcerative colitis, LBBB, Meniere's disease and advanced age. Last echo back in 07/2014 showed her EF had normalized at 50 to 55%.   I last saw her back in May - she was doing well - but limited by knee pain from remote fall/accident - someone had tripped her in the airport. Cardiac status was ok.   I saw her about 2 weeks ago for a work in visit - having more swelling - was on more Norvasc. I changed her medicines around. BNP not all that impressive.   Comes back today. Here alone. She has stopped her Norvasc altogether - did this last Thursday - her swelling is now totally resolved. She says she did not like how it made her feel. BP now up some here today. She has not been checking her BP at home. She has been running around today and feels like it is because she thought she was going to be late. No chest pain. Breathing is good. She does feel like she is more fatigued than she should be. She is now 57.   Past Medical History:  Diagnosis Date  . Biventricular ICD (implantable cardiac defibrillator) in place   . Cellulitis   . CHF (congestive heart failure) ([place])   . Colon polyps   . Diabetes mellitus    Type II  . Dyslipidemia   . HOH (hard of hearing)    left  . HTN (hypertension)   . Ischemic cardiomyopathy   . LBBB (left bundle branch block)    chronic  . Meniere's  disease   . T wave oversensing 01/17/2014  . Ulcerative colitis   . Varicose veins    4 lazer  treatment each leg    Past Surgical History:  Procedure Laterality Date  . ABDOMINAL HYSTERECTOMY    . ABDOMINAL HYSTERECTOMY    . CARDIAC CATHETERIZATION  08/2011  . CHOLECYSTECTOMY    . COLONOSCOPY    . EP Study  08/22/08  . IMPLANTABLE CARDIOVERTER DEFIBRILLATOR GENERATOR CHANGE N/A 08/22/2013   Procedure: IMPLANTABLE CARDIOVERTER DEFIBRILLATOR GENERATOR CHANGE;  Surgeon: Deboraha Sprang, MD;  Location: [facility] CATH LAB;  Service: Cardiovascular;  Laterality: N/A;  . KNEE ARTHROSCOPY Left 04/14/2016   Procedure: LEFT KNEE ARTHROSCOPY, PARTIAL MEDIAL AND PARTIAL LATERAL MENISCECTOMY, MEDIAL PLICA;  Surgeon: Dorna Leitz, MD;  Location: [place];  Service: Orthopedics;  Laterality: Left;  . mastoid sugery     in the setting of Meniere's disease  . PACEMAKER INSERTION     Medtronic, remote- no.   . pericardial effusion  12/2002  . TONSILLECTOMY AND ADENOIDECTOMY       Medications: Current Outpatient Prescriptions  Medication Sig Dispense Refill  . aspirin EC 81 MG tablet Take 1 tablet (81 mg total) by mouth daily.    Marland Kitchen atorvastatin (LIPITOR) 40  MG tablet Take 40 mg by mouth daily.  3  . B Complex-C-Folic Acid (EQL SUPER B COMPLEX/VITAMIN C PO) Take 1 tablet by mouth daily.    . Biotin (BIOTIN 5000) 5 MG CAPS Take 1 capsule by mouth daily.    Marland Kitchen CALCIUM-MAG-VIT C-VIT D PO Take 1 tablet by mouth daily.    . furosemide (LASIX) 40 MG tablet Take 40 mg by mouth daily. 60 mg for one week - then back to 40 mg a day    . HUMALOG MIX 75/25 (75-25) 100 UNIT/ML SUSP injection Inject 15 Units into the skin 2 (two) times daily with a meal. Over 200 take (45 units) break up in three part to take 3 times daily  1  . losartan (COZAAR) 100 MG tablet Take 100 mg by mouth daily.    . metoprolol succinate (TOPROL-XL) 100 MG 24 hr tablet Take 1 tablet by mouth 2 (two) times daily.    . potassium chloride (K-DUR) 10  MEQ tablet Take 10 mEq by mouth. 2 tablets daily (20 mEq total)    . sitaGLIPtin (JANUVIA) 100 MG tablet Take 100 mg by mouth daily.    Marland Kitchen amLODipine (NORVASC) 5 MG tablet Take 5 mg by mouth daily.     No current facility-administered medications for this visit.     Allergies: Allergies  Allergen Reactions  . Carvedilol Other (See Comments)  . Vancomycin Other (See Comments)  . Sudafed [Pseudoephedrine] Other (See Comments)    Makes skin feel like its crawling    Social History: The patient  reports that she has quit smoking. She quit after 1.00 year of use. She does not have any smokeless tobacco history on file. She reports that she does not drink alcohol or use drugs.   Family History: The patient's family history includes Cancer (age of onset: 85) in her mother; Emphysema (age of onset: 51) in her father.   Review of Systems: Please see the history of present illness.   Otherwise, the review of systems is positive for none.   All other systems are reviewed and negative.   Physical Exam: VS:  BP (!) 160/90   Pulse 86   Ht 5' 6.5" (1.689 m)   Wt 136 lb (61.7 kg)   SpO2 100% Comment: at rest  BMI 21.62 kg/m  .  BMI Body mass index is 21.62 kg/m.  Wt Readings from Last 3 Encounters:  08/11/16 136 lb (61.7 kg)  07/30/16 138 lb 12.8 oz (63 kg)  04/14/16 139 lb (63 kg)    General: Pleasant. Well developed, well nourished and in no acute distress. Weight is down 2 pounds.  She looks younger than her stated age.  HEENT: Normal.  Neck: Supple, no JVD, carotid bruits, or masses noted.  Cardiac: Regular rate and rhythm. No murmurs, rubs, or gallops. No edema. She has her support stockings in place. Respiratory:  Lungs are clear to auscultation bilaterally with normal work of breathing.  GI: Soft and nontender.  MS: No deformity or atrophy. Gait and ROM intact.  Skin: Warm and dry. Color is normal.  Neuro:  Strength and sensation are intact and no gross focal deficits noted.    Psych: Alert, appropriate and with normal affect.   LABORATORY DATA:  EKG:  EKG is not ordered today.  Lab Results  Component Value Date   WBC 6.2 07/30/2016   HGB 13.1 07/30/2016   HCT 39.2 07/30/2016   PLT 156 07/30/2016   GLUCOSE 237 (H) 07/30/2016  ALT 19 07/30/2016   AST 22 07/30/2016   NA 142 07/30/2016   K 3.9 07/30/2016   CL 103 07/30/2016   CREATININE 0.81 07/30/2016   BUN 17 07/30/2016   CO2 27 07/30/2016   TSH 1.23 07/20/2011   INR 1.1 (H) 08/13/2013    BNP (last 3 results)  Recent Labs  07/30/16 0841  BNP 119.8*    ProBNP (last 3 results) No results for input(s): PROBNP in the last 8760 hours.   Other Studies Reviewed Today:  Echo Study Conclusions from 07/2014  - Left ventricle: The cavity size was normal. Systolic function was normal. The estimated ejection fraction was in the range of 50% to 55%. There was an increased relative contribution of atrial contraction to ventricular filling. Doppler parameters are consistent with abnormal left ventricular relaxation (grade 1 diastolic dysfunction). - Ventricular septum: Septal motion showed paradox. These changes are consistent with right ventricular pacing. - Aortic valve: There was mild regurgitation. - Mitral valve: There is a calcified mobile density in the LV cavity most consistent with calcified redundant chordae tendinae. This was present on echo in 2014. There was mild regurgitation. - Pulmonic valve: There was trivial regurgitation.  Assessment / Plan: 1. Volume overload - this has now resolved with cessation of her Norvasc.   2. NICM - with normalization of her EF - 50 to 55% by echo from September of 2015 - will hold on repeating her echo for now.   3. Underlying CRT-D - followed by Dr. Caryl Comes  4. HTN - BP now up - recheck is 160/80 by me. She is agreeable to checking at home and calling me with an update in 2 weeks. May need to consider adding low dose Aldactone  and stopping her potassium  5. Advanced age - overall, she does remarkably well.   Current medicines are reviewed with the patient today.  The patient does not have concerns regarding medicines other than what has been noted above.  The following changes have been made:  See above.  Labs/ tests ordered today include:   No orders of the defined types were placed in this encounter.    Disposition:   FU with me as planned.    Patient is agreeable to this plan and will call if any problems develop in the interim.   Signed: Burtis Junes, RN, ANP-C 08/11/2016 3:46 PM  [facility] [address] [place], [place]  [address] Phone: [phone] Fax: [phone]

## 2016-08-10 NOTE — Progress Notes (Addendum)
EPIC Encounter for ICM Monitoring  Patient Name: Grace Robinson is a 82 y.o. female Date: 08/10/2016 Primary Care Physican: Precious Reel, MD Primary Cardiologist: Truitt Merle, NP Electrophysiologist: Faustino Congress Weight:    unknown Bi-V Pacing:  98.4%       Heart Failure questions reviewed, pt reported leg, feet and ankle swelling are very minimal now and she is feeling much better.  She stated she stopped taking Amlodopine and thinks that is what was making her legs hurt and swell.  Truitt Merle, NP had decreased frequency of Amlodopine at last office visit but patient decided to stop it all together.  She will discuss with Cecille Rubin at [place] office visit  Thoracic impedance still slightly abnormal suggesting fluid accumulation.    Recommendations: No changes.  She has office visit with Truitt Merle, NP tomorrow, 08/11/2016 at 3:30 pm.    Follow-up plan: ICM clinic phone appointment on 09/14/2016.  Copy of ICM check sent to primary cardiologist and device physician.   ICM trend: 08/10/2016       [name], RN 08/10/2016 10:52 AM

## 2016-08-11 ENCOUNTER — Encounter (INDEPENDENT_AMBULATORY_CARE_PROVIDER_SITE_OTHER): Payer: Self-pay

## 2016-08-11 ENCOUNTER — Encounter: Payer: Medicare Other | Admitting: Internal Medicine

## 2016-08-11 ENCOUNTER — Encounter: Payer: Self-pay | Admitting: Nurse Practitioner

## 2016-08-11 ENCOUNTER — Ambulatory Visit (INDEPENDENT_AMBULATORY_CARE_PROVIDER_SITE_OTHER): Payer: Medicare Other | Admitting: Nurse Practitioner

## 2016-08-11 VITALS — BP 160/90 | HR 86 | Ht 66.5 in | Wt 136.0 lb

## 2016-08-11 DIAGNOSIS — I10 Essential (primary) hypertension: Secondary | ICD-10-CM

## 2016-08-11 DIAGNOSIS — I5022 Chronic systolic (congestive) heart failure: Secondary | ICD-10-CM

## 2016-08-11 NOTE — Patient Instructions (Addendum)
We will be checking the following labs today - NONE   Medication Instructions:    Continue with your current medicines.     Testing/Procedures To Be Arranged:  N/A  Follow-Up:   See me as planned in December    Other Special Instructions:   Monitor your blood pressure at home - call us with an update in 2 weeks    If you need a refill on your cardiac medications before your next appointment, please call your pharmacy.   Call the [place] office at [phone] if you have any questions, problems or concerns.

## 2016-08-25 ENCOUNTER — Telehealth: Payer: Self-pay | Admitting: *Deleted

## 2016-08-25 ENCOUNTER — Other Ambulatory Visit: Payer: Self-pay | Admitting: *Deleted

## 2016-08-25 ENCOUNTER — Encounter: Payer: Self-pay | Admitting: Physician Assistant

## 2016-08-25 DIAGNOSIS — M1711 Unilateral primary osteoarthritis, right knee: Secondary | ICD-10-CM | POA: Diagnosis not present

## 2016-08-25 DIAGNOSIS — S46912A Strain of unspecified muscle, fascia and tendon at shoulder and upper arm level, left arm, initial encounter: Secondary | ICD-10-CM | POA: Diagnosis not present

## 2016-08-25 DIAGNOSIS — K580 Irritable bowel syndrome with diarrhea: Secondary | ICD-10-CM

## 2016-08-25 NOTE — Telephone Encounter (Signed)
LVM to monitor bp for two more weeks and call with update and a referral was placed in system. Call back if any questions.

## 2016-08-25 NOTE — Telephone Encounter (Signed)
Let's just monitor for a little longer.   Call me with update in 2 weeks.

## 2016-08-25 NOTE — Telephone Encounter (Signed)
S/w pt would like a GI referral for IBS with diarrhea pt has had for two weeks. Referral placed in system and added to comments pt to see Nicoletta Ba, PA, per Truitt Merle, NP.  Also, pt called in to report bp readings:  Oct 8  146/82 /no meds Pt was sick than; Oct 13  150/79 w/ meds Oct 14 142/70  Oct 15 135/72 no med Oct 17 141/99 w/meds Oct 18 140/74 Oct 21 149 82 no med Oct 22 163/81 w/med Oct 23 178/86 w/med Oct 24 157/73 w/med Oct 25 189/72   Will send to [place] to [place].

## 2016-08-27 ENCOUNTER — Encounter (HOSPITAL_COMMUNITY): Payer: Self-pay | Admitting: Emergency Medicine

## 2016-08-27 ENCOUNTER — Emergency Department (HOSPITAL_COMMUNITY): Payer: Medicare Other

## 2016-08-27 ENCOUNTER — Emergency Department (HOSPITAL_COMMUNITY)
Admission: EM | Admit: 2016-08-27 | Discharge: 2016-08-27 | Disposition: A | Discharge status: Home / Self Care | Payer: Medicare Other | Attending: Emergency Medicine | Admitting: Emergency Medicine

## 2016-08-27 DIAGNOSIS — E119 Type 2 diabetes mellitus without complications: Secondary | ICD-10-CM | POA: Insufficient documentation

## 2016-08-27 DIAGNOSIS — M542 Cervicalgia: Secondary | ICD-10-CM | POA: Diagnosis not present

## 2016-08-27 DIAGNOSIS — M436 Torticollis: Secondary | ICD-10-CM | POA: Insufficient documentation

## 2016-08-27 DIAGNOSIS — Z79899 Other long term (current) drug therapy: Secondary | ICD-10-CM | POA: Diagnosis not present

## 2016-08-27 DIAGNOSIS — I110 Hypertensive heart disease with heart failure: Secondary | ICD-10-CM | POA: Diagnosis not present

## 2016-08-27 DIAGNOSIS — Z87891 Personal history of nicotine dependence: Secondary | ICD-10-CM | POA: Insufficient documentation

## 2016-08-27 DIAGNOSIS — I509 Heart failure, unspecified: Secondary | ICD-10-CM | POA: Diagnosis not present

## 2016-08-27 DIAGNOSIS — Z7982 Long term (current) use of aspirin: Secondary | ICD-10-CM | POA: Diagnosis not present

## 2016-08-27 MED ORDER — MORPHINE SULFATE (PF) 2 MG/ML IV SOLN: 4.0000 mg | Freq: Once | INTRAVENOUS | Status: DC

## 2016-08-27 MED ORDER — HYDROCODONE-ACETAMINOPHEN 5-325 MG PO TABS
1.0000 | ORAL_TABLET | Freq: Once | ORAL | Status: AC
  Administered 2016-08-27: 1 via ORAL
  Filled 2016-08-27: qty 1

## 2016-08-27 MED ORDER — DIAZEPAM 5 MG/ML IJ SOLN
5.0000 mg | Freq: Once | INTRAMUSCULAR | Status: DC
  Filled 2016-08-27: qty 2

## 2016-08-27 MED ORDER — DIAZEPAM 5 MG/ML IJ SOLN
5.0000 mg | Freq: Once | INTRAMUSCULAR | Status: AC
  Administered 2016-08-27: 5 mg via INTRAMUSCULAR

## 2016-08-27 MED ORDER — DIAZEPAM 5 MG PO TABS: 2.5000 mg | ORAL_TABLET | Freq: Two times a day (BID) | ORAL | 0 refills | Status: DC

## 2016-08-27 MED ORDER — DIAZEPAM 5 MG/ML IJ SOLN: 2.5000 mg | Freq: Once | INTRAMUSCULAR | Status: DC

## 2016-08-27 NOTE — ED Triage Notes (Signed)
Patient reports pain in neck which began Tuesday morning.  Reports that the pain radiates up her neck.  States that she was seen at urgent care and given robaxin which she takes at night without relief.

## 2016-08-27 NOTE — Discharge Instructions (Signed)
Please use heat on urinary. She may take the Valium as needed for muscle spasms. This will make you sleepy. Please do not drive with this medication. You can follow-up with her primary care doctor if symptoms persist. Please return to the ED if your symptoms worsen or for any other reason.

## 2016-08-27 NOTE — ED Notes (Signed)
Bed: WTR9 Expected date:  Expected time:  Means of arrival:  Comments: 

## 2016-08-27 NOTE — ED Provider Notes (Signed)
[place] DEPT Provider Note   CSN: [id number] Arrival date & time: 08/27/16  0915     History   Chief Complaint Chief Complaint  Patient presents with  . Neck Pain    HPI Grace Robinson is a 82 y.o. female.  82 year old Caucasian female past medical history significant for hypertension, diabetes, CHF presents to the ED today with neck pain. Patient states that she woke up Tuesday morning with what she thought was a crick in her neck. States she went to urgent care yesterday and they gave her a muscle relaxer. She states she's been taking this at night without any relief. Patient states the pain are spasms that are intermittent, but come every 30 seconds. Patient has not tried anything else for the pain. Patient states that the pain was on the left side of her neck and now has spread to the right side and down into her upper back. Patient states moving makes the pain worse. Nothing made the pain better. She denies any fever, chills, headache, vision changes, chest pain, shortness of breath, abdominal pain, nausea, emesis, urinary symptoms, change in bowel habits, numbness/tingling.  Patient states she had a similar episode of this a few years ago in her lower back that she takes muscle relaxers help. States this pain is worse.      Past Medical History:  Diagnosis Date  . Biventricular ICD (implantable cardiac defibrillator) in place   . Cellulitis   . CHF (congestive heart failure) ([place])   . Colon polyps   . Diabetes mellitus    Type II  . Dyslipidemia   . HOH (hard of hearing)    left  . HTN (hypertension)   . Ischemic cardiomyopathy   . LBBB (left bundle branch block)    chronic  . Meniere's disease   . T wave oversensing 01/17/2014  . Ulcerative colitis   . Varicose veins    4 lazer  treatment each leg    Patient Active Problem List   Diagnosis Date Noted  . Degenerative tear of posterior horn of medial meniscus of left knee 04/14/2016  . Tear of lateral  meniscus of left knee 04/14/2016  . Synovial plica of left knee [id number]  . T wave oversensing 01/17/2014  . Fall 07/10/2013  . Paroxysmal ventricular tachycardia ([place]) 12/13/2012  . Biventricular implantable cardioverter-defibrillator -Medtronic 12/08/2011  . Atrial tachycardia ([place]) 12/08/2011  . HTN (hypertension) 05/19/2011  . CARDIOMYOPATHY, PRIMARY, DILATED 12/01/2010    Past Surgical History:  Procedure Laterality Date  . ABDOMINAL HYSTERECTOMY    . ABDOMINAL HYSTERECTOMY    . CARDIAC CATHETERIZATION  08/2011  . CHOLECYSTECTOMY    . COLONOSCOPY    . EP Study  08/22/08  . IMPLANTABLE CARDIOVERTER DEFIBRILLATOR GENERATOR CHANGE N/A 08/22/2013   Procedure: IMPLANTABLE CARDIOVERTER DEFIBRILLATOR GENERATOR CHANGE;  Surgeon: Deboraha Sprang, MD;  Location[facility] LAB;  Service: Cardiovascular;  Laterality: N/A;  . KNEE ARTHROSCOPY Left 04/14/2016   Procedure: LEFT KNEE ARTHROSCOPY, PARTIAL MEDIAL AND PARTIAL LATERAL MENISCECTOMY, MEDIAL PLICA;  Surgeon: Dorna Leitz, MD;  Location: [place];  Service: Orthopedics;  Laterality: Left;  . mastoid sugery     in the setting of Meniere's disease  . PACEMAKER INSERTION     Medtronic, remote- no.   . pericardial effusion  12/2002  . TONSILLECTOMY AND ADENOIDECTOMY      OB History    No data available       Home Medications    Prior to Admission medications  Medication Sig Start Date End Date Taking? Authorizing Provider  aspirin EC 81 MG tablet Take 1 tablet (81 mg total) by mouth daily. 07/20/11   Deboraha Sprang, MD  atorvastatin (LIPITOR) 40 MG tablet Take 40 mg by mouth daily. 04/21/15   Historical Provider, MD  B Complex-C-Folic Acid (EQL SUPER B COMPLEX/VITAMIN C PO) Take 1 tablet by mouth daily.    Historical Provider, MD  Biotin (BIOTIN 5000) 5 MG CAPS Take 1 capsule by mouth daily.    Historical Provider, MD  CALCIUM-MAG-VIT C-VIT D PO Take 1 tablet by mouth daily.    Historical Provider, MD  furosemide (LASIX) 40 MG tablet  Take 40 mg by mouth daily. 60 mg for one week - then back to 40 mg a day    Historical Provider, MD  HUMALOG MIX 75/25 (75-25) 100 UNIT/ML SUSP injection Inject 15 Units into the skin 2 (two) times daily with a meal. Over 200 take (45 units) break up in three part to take 3 times daily 04/21/15   Historical Provider, MD  losartan (COZAAR) 100 MG tablet Take 100 mg by mouth daily.    Historical Provider, MD  metoprolol succinate (TOPROL-XL) 100 MG 24 hr tablet Take 1 tablet by mouth 2 (two) times daily. 07/04/13   Historical Provider, MD  potassium chloride (K-DUR) 10 MEQ tablet Take 10 mEq by mouth. 2 tablets daily (20 mEq total)    Historical Provider, MD  sitaGLIPtin (JANUVIA) 100 MG tablet Take 100 mg by mouth daily.    Historical Provider, MD    Family History Family History  Problem Relation Age of Onset  . Emphysema Father 85  . Cancer Mother 58    Social History Social History  Substance Use Topics  . Smoking status: Former Smoker    Years: 1.00  . Smokeless tobacco: Never Used     Comment: quit in 1970  . Alcohol use No     Allergies   Carvedilol; Vancomycin; and Sudafed [pseudoephedrine]   Review of Systems Review of Systems  Constitutional: Negative for chills and fever.  HENT: Negative for congestion and rhinorrhea.   Eyes: Negative for visual disturbance.  Respiratory: Negative for cough and shortness of breath.   Cardiovascular: Negative for chest pain and palpitations.  Gastrointestinal: Negative for abdominal pain, diarrhea, nausea and vomiting.  Musculoskeletal: Positive for neck pain and neck stiffness. Negative for gait problem.  Skin: Negative.   Neurological: Negative for dizziness, syncope, weakness, light-headedness, numbness and headaches.  All other systems reviewed and are negative.    Physical Exam Updated Vital Signs BP 175/84 (BP Location: Right Arm)   Pulse 93   Temp 97.3 F (36.3 C) (Oral)   Resp 15   Ht 5' 6.5" (1.689 m)   Wt 61.2 kg    SpO2 100%   BMI 21.46 kg/m   Physical Exam  Constitutional: She is oriented to person, place, and time. She appears well-developed and well-nourished. No distress.  HENT:  Head: Normocephalic and atraumatic.  Right Ear: Tympanic membrane, external ear and ear canal normal.  Left Ear: Tympanic membrane, external ear and ear canal normal.  Nose: Nose normal.  Mouth/Throat: Uvula is midline, oropharynx is clear and moist and mucous membranes are normal.  Eyes: EOM are normal. Pupils are equal, round, and reactive to light. Right eye exhibits no discharge. Left eye exhibits no discharge. No scleral icterus.  Neck: Neck supple. Muscular tenderness present. No spinous process tenderness present. No neck rigidity.  Significant tenderness  to palpation of the bilateral neck muscle that radiates down into the upper trapezius. She has no midline tenderness. Patient has limited range of motion of her neck due to the spasms. Patient is tilting her head to the right to help with the pain. Active spasms noted.   Pulmonary/Chest: No respiratory distress.  Musculoskeletal: Normal range of motion.  Lymphadenopathy:    She has no cervical adenopathy.  Neurological: She is alert and oriented to person, place, and time. GCS eye subscore is 4. GCS verbal subscore is 5. GCS motor subscore is 6.  The patient is alert, attentive, and oriented x 3. Speech is clear. Cranial nerve II-VII grossly intact. Negative pronator drift. Sensation intact. Strength 5/5 in all extremities. Reflexes 2+ and symmetric at biceps, triceps, knees, and ankles. Rapid alternating movement and fine finger movements intact. Romberg is absent. Posture and gait normal.   Skin: Skin is warm and dry. Capillary refill takes less than 2 seconds. No pallor.  Nursing note and vitals reviewed.    ED Treatments / Results  Labs (all labs ordered are listed, but only abnormal results are displayed) Labs Reviewed - No data to display  EKG  EKG  Interpretation None       Radiology No results found.  Procedures Procedures (including critical care time)  Medications Ordered in ED Medications  HYDROcodone-acetaminophen (NORCO/VICODIN) 5-325 MG per tablet 1 tablet (1 tablet Oral Given 08/27/16 1215)  diazepam (VALIUM) injection 5 mg (5 mg Intramuscular Given 08/27/16 1216)     Initial Impression / Assessment and Plan / ED Course  I have reviewed the triage vital signs and the nursing notes.  Pertinent labs & imaging results that were available during my care of the patient were reviewed by me and considered in my medical decision making (see chart for details).  Clinical Course  Patient presented for right sided neck spasm and pain. She has been trying robaxin at home with little relief. She was given IV valium and norco in ED with significant improvement. She was also given heating packs that helped. Due to history of cancer will get Ct scan of cervical spine. CT without acute changes. Chronic changes noted and patient informed to follow up with PCP. Patient was given rx for valium to take as needed for spasm. She was encouraged to follow up with PCP on Monday if symptoms persists. Patient was seen and examined by Dr. Alvino Chapel who agrees with the above plan. Patient is hemodynamically stable and will be discharged home in NAD with stable vs. Patient verbalizes understanding with plan. Strict return precautions given.  Final Clinical Impressions(s) / ED Diagnoses   Final diagnoses:  Torticollis    New Prescriptions Discharge Medication List as of 08/27/2016  2:03 PM    START taking these medications   Details  diazepam (VALIUM) 5 MG tablet Take 0.5 tablets (2.5 mg total) by mouth 2 (two) times daily., Starting Fri 08/27/2016, Print         Doristine Devoid, PA-C 08/29/16 2040    Davonna Belling, MD 09/01/16 [phone]

## 2016-08-30 DIAGNOSIS — E1122 Type 2 diabetes mellitus with diabetic chronic kidney disease: Secondary | ICD-10-CM | POA: Diagnosis not present

## 2016-09-06 ENCOUNTER — Ambulatory Visit (INDEPENDENT_AMBULATORY_CARE_PROVIDER_SITE_OTHER): Payer: Medicare Other | Admitting: Physician Assistant

## 2016-09-06 ENCOUNTER — Other Ambulatory Visit (INDEPENDENT_AMBULATORY_CARE_PROVIDER_SITE_OTHER): Payer: Medicare Other

## 2016-09-06 ENCOUNTER — Encounter (INDEPENDENT_AMBULATORY_CARE_PROVIDER_SITE_OTHER): Payer: Self-pay

## 2016-09-06 ENCOUNTER — Encounter: Payer: Self-pay | Admitting: Physician Assistant

## 2016-09-06 VITALS — BP 114/74 | HR 76 | Ht 66.5 in | Wt 131.1 lb

## 2016-09-06 DIAGNOSIS — R197 Diarrhea, unspecified: Secondary | ICD-10-CM

## 2016-09-06 DIAGNOSIS — R159 Full incontinence of feces: Secondary | ICD-10-CM

## 2016-09-06 DIAGNOSIS — R152 Fecal urgency: Secondary | ICD-10-CM | POA: Diagnosis not present

## 2016-09-06 LAB — CBC WITH DIFFERENTIAL/PLATELET
BASOS ABS: 0 10*3/uL (ref 0.0–0.1)
Basophils Relative: 0.4 % (ref 0.0–3.0)
EOS PCT: 2.9 % (ref 0.0–5.0)
Eosinophils Absolute: 0.2 10*3/uL (ref 0.0–0.7)
HCT: 39.7 % (ref 36.0–46.0)
Hemoglobin: 13.6 g/dL (ref 12.0–15.0)
LYMPHS ABS: 1.3 10*3/uL (ref 0.7–4.0)
Lymphocytes Relative: 22.3 % (ref 12.0–46.0)
MCHC: 34.2 g/dL (ref 30.0–36.0)
MCV: 91 fl (ref 78.0–100.0)
MONO ABS: 0.6 10*3/uL (ref 0.1–1.0)
Monocytes Relative: 10.4 % (ref 3.0–12.0)
NEUTROS PCT: 64 % (ref 43.0–77.0)
Neutro Abs: 3.7 10*3/uL (ref 1.4–7.7)
Platelets: 220 10*3/uL (ref 150.0–400.0)
RBC: 4.36 Mil/uL (ref 3.87–5.11)
RDW: 13.9 % (ref 11.5–15.5)
WBC: 5.8 10*3/uL (ref 4.0–10.5)

## 2016-09-06 LAB — C-REACTIVE PROTEIN: CRP: 0.1 mg/dL — AB (ref 0.5–20.0)

## 2016-09-06 LAB — SEDIMENTATION RATE: SED RATE: 48 mm/h — AB (ref 0–30)

## 2016-09-06 MED ORDER — DICYCLOMINE HCL 10 MG PO CAPS: ORAL_CAPSULE | ORAL | 6 refills | Status: DC

## 2016-09-06 MED ORDER — SACCHAROMYCES BOULARDII 250 MG PO CAPS: 250.0000 mg | ORAL_CAPSULE | Freq: Two times a day (BID) | ORAL | 1 refills | Status: DC

## 2016-09-06 NOTE — Patient Instructions (Addendum)
Please go to the basement level to have your labs drawn and stool study.  We sent prescriptions to [facility] [address]  1. Florastor probiotic.  2. Bentyl ( Dicyclomine) 10 mg.

## 2016-09-06 NOTE — Progress Notes (Signed)
Subjective:    Patient ID: Grace Robinson, female    DOB: 1925-12-11, 82 y.o.   MRN: [id number]  HPI Grace Robinson is a very nice 82 year old white female, new to GI today referred by Truitt Merle NP/cardiology for evaluation of chronic diarrhea. Patient states that she had a colonoscopy about 15 years ago per Dr. Oletta Lamas and that this was a negative exam. She remembers having diarrhea as far back as 15 years ago. Other medical problems include congestive heart failure, nonischemic cardiomyopathy with last EF measured at 50-55%. She is status post pacemaker/ICD. Also has history of adult-onset diabetes mellitus, and Mnire's disease. Patient says she has had long-term issues with episodic diarrhea and urgency. She has had periodic episodes of incontinence. Over the past 3 weeks she has had an ongoing episode of diarrhea and has not had any normal stools. He says it seems to be a little bit better over the past few days since she started eating a high-fiber cereal. She is lactose intolerant and does not consume lactose-containing products. Over the past month or so she feels that she's lost 89 pounds. Appetite is fine no nausea or vomiting no complaints of abdominal pain or cramping no fever or chills. She says she gets extreme urgency at times, has absolutely no warning and then incontinence. She says generally is that these episodes start with a normal bowel movement followed in quick succession by several episodes of progressively more loose stool and then she gets into the diarrhea which generally will last for a few days but this last episode again over 3 weeks. No new medications no recent antibiotics. She has tried Kaopectate Imodium Pepto-Bismol all without much benefit and was given a course of metronidazole 1 week by her PCP Dr. Virgina Jock again without any benefit. She says she does use Imodium if she no she's going out that sometimes even this doesn't work.  Review of Systems Pertinent positive and  negative review of systems were noted in the above HPI section.  All other review of systems was otherwise negative.  Outpatient Encounter Prescriptions as of 09/06/2016  Medication Sig  . acetaminophen (TYLENOL) 650 MG CR tablet Take 650 mg by mouth as needed for pain.  Marland Kitchen aspirin EC 81 MG tablet Take 1 tablet (81 mg total) by mouth daily.  Marland Kitchen atorvastatin (LIPITOR) 40 MG tablet Take 40 mg by mouth daily.  . B Complex-C-Folic Acid (EQL SUPER B COMPLEX/VITAMIN C PO) Take 1 tablet by mouth daily.  . Biotin (BIOTIN 5000) 5 MG CAPS Take 1 capsule by mouth daily.  Marland Kitchen CALCIUM-MAG-VIT C-VIT D PO Take 1 tablet by mouth daily.  . furosemide (LASIX) 40 MG tablet Take 40 mg by mouth daily. 60 mg for one week - then back to 40 mg a day  . HUMALOG MIX 75/25 (75-25) 100 UNIT/ML SUSP injection Inject 15 Units into the skin 2 (two) times daily with a meal. Over 200 take (45 units) break up in three part to take 3 times daily  . losartan (COZAAR) 100 MG tablet Take 100 mg by mouth daily.  . metoprolol succinate (TOPROL-XL) 100 MG 24 hr tablet Take 1 tablet by mouth 2 (two) times daily.  . potassium chloride (K-DUR) 10 MEQ tablet Take 10 mEq by mouth. 2 tablets daily (20 mEq total)  . sitaGLIPtin (JANUVIA) 100 MG tablet Take 100 mg by mouth daily.  Marland Kitchen dicyclomine (BENTYL) 10 MG capsule Take 1 tab 30 min before meals  . saccharomyces boulardii (FLORASTOR) 250  MG capsule Take 1 capsule (250 mg total) by mouth 2 (two) times daily.  . [DISCONTINUED] diazepam (VALIUM) 5 MG tablet Take 0.5 tablets (2.5 mg total) by mouth 2 (two) times daily.   No facility-administered encounter medications on file as of 09/06/2016.    Allergies  Allergen Reactions  . Carvedilol Other (See Comments)  . Vancomycin Other (See Comments)  . Sudafed [Pseudoephedrine] Other (See Comments)    Makes skin feel like its crawling   Patient Active Problem List   Diagnosis Date Noted  . Degenerative tear of posterior horn of medial meniscus of  left knee 04/14/2016  . Tear of lateral meniscus of left knee 04/14/2016  . Synovial plica of left knee [id number]  . T wave oversensing 01/17/2014  . Fall 07/10/2013  . Paroxysmal ventricular tachycardia ([place]) 12/13/2012  . Biventricular implantable cardioverter-defibrillator -Medtronic 12/08/2011  . Atrial tachycardia ([place]) 12/08/2011  . HTN (hypertension) 05/19/2011  . CARDIOMYOPATHY, PRIMARY, DILATED 12/01/2010   Social History   Social History  . Marital status: Single    Spouse name: N/A  . Number of children: 2  . Years of education: N/A   Occupational History  . retired    Social History Main Topics  . Smoking status: Former Smoker    Years: 1.00  . Smokeless tobacco: Never Used     Comment: quit in 1970  . Alcohol use No  . Drug use: No  . Sexual activity: Not Currently    Birth control/ protection: Post-menopausal   Other Topics Concern  . Not on file   Social History Narrative   Widowed, has 2 children.     Ms. Razza family history includes Breast cancer in her mother; Emphysema (age of onset: 14) in her father; Heart disease in her father; Lung cancer in her mother.      Objective:    Vitals:   09/06/16 0951  BP: 114/74  Pulse: 76    Physical Exam well-developed elderly white female in no acute distress, very pleasant blood pressure 114/74 pulse 76, Height 5 foot 6 weight 131 BMI of 20.8. HEENT; nontraumatic, cephalic EOMI PERRLA sclera anicteric, Cardiovascular; regular rate and rhythm with S1-S2 no murmur or gallop, Pulmonary; clear bilaterally, Abdomen ;soft nontender nondistended bowel sounds are active there is no palpable mass or hepatosplenomegaly, Rectal; exam not done, Ext; no clubbing cyanosis or edema skin warm and dry, Neuropsych; mood and affect appropriate       Assessment & Plan:   #6 82 year old white female with chronic intermittent diarrhea-most consistent with IBS/D with a prolonged episode of diarrhea over the past 3  weeks Consider infectious etiology, consider underlying microscopic colitis, consider diabetic this role neuropathy #2 congestive heart failure # 3 status post pacemaker ICD #4 adult onset diabetes mellitus  Plan; we'll check CBC with differential, CRP, sedimentation rate, GI pathogen panel, stool for lactoferrin, fecal elastase, Start Florastor  by mouth twice a day Start Bentyl 10 mg one half hour before meals She may continue to use Imodium on a when necessary basis, especially prior to going out to eat etc. Patient will need follow-up with Dr. Silverio Decamp or myself in about a month.  Mehran Guderian Genia Harold PA-C 09/06/2016   Cc: Shon Baton, MD

## 2016-09-07 ENCOUNTER — Other Ambulatory Visit: Payer: Medicare Other

## 2016-09-07 DIAGNOSIS — R159 Full incontinence of feces: Secondary | ICD-10-CM | POA: Diagnosis not present

## 2016-09-07 DIAGNOSIS — R197 Diarrhea, unspecified: Secondary | ICD-10-CM | POA: Diagnosis not present

## 2016-09-07 DIAGNOSIS — R152 Fecal urgency: Secondary | ICD-10-CM | POA: Diagnosis not present

## 2016-09-07 NOTE — Progress Notes (Signed)
Reviewed and agree with documentation and assessment and plan. K. Veena Runell Kovich , MD   

## 2016-09-08 ENCOUNTER — Telehealth: Payer: Self-pay | Admitting: Nurse Practitioner

## 2016-09-08 LAB — FECAL LACTOFERRIN, QUANT: LACTOFERRIN: NEGATIVE

## 2016-09-08 NOTE — Telephone Encounter (Signed)
Grace Robinson is wanting to speak with you about her blood pressure . Please call

## 2016-09-08 NOTE — Telephone Encounter (Signed)
Pt calling in BP's as follows:  Oct 26  141/72 Oct 27  132/82 Oct 28  161/78 Oct 29  153/77 Oct 30  149/99 Oct 31  152/81  Nov 1  160/80 Nov 3  163/80 Nov 4  140/73 Nov 5  159/73 Nov 6  114/74 Nov 7  140/73 Nov 8  142/76  Will send to [place] to advise

## 2016-09-08 NOTE — Telephone Encounter (Signed)
Continue with her current regimen. No changes for now with her medicines.

## 2016-09-09 LAB — GASTROINTESTINAL PATHOGEN PANEL PCR
C. difficile Tox A/B, PCR: NOT DETECTED
CRYPTOSPORIDIUM, PCR: NOT DETECTED
Campylobacter, PCR: NOT DETECTED
E coli (ETEC) LT/ST PCR: NOT DETECTED
E coli (STEC) stx1/stx2, PCR: NOT DETECTED
E coli 0157, PCR: NOT DETECTED
GIARDIA LAMBLIA, PCR: NOT DETECTED
NOROVIRUS, PCR: NOT DETECTED
ROTAVIRUS, PCR: NOT DETECTED
Salmonella, PCR: NOT DETECTED
Shigella, PCR: NOT DETECTED

## 2016-09-13 ENCOUNTER — Telehealth: Payer: Self-pay | Admitting: Physician Assistant

## 2016-09-13 NOTE — Telephone Encounter (Signed)
Patient reports her bowel movements are unformed loose, but not watery. She is on Florastor BID and Bentyl 10 mg ac meals. Any other changes she could make. We did talk about Imodium PRN outings. She wants formed stool.

## 2016-09-14 ENCOUNTER — Ambulatory Visit (INDEPENDENT_AMBULATORY_CARE_PROVIDER_SITE_OTHER): Payer: Medicare Other

## 2016-09-14 DIAGNOSIS — I5022 Chronic systolic (congestive) heart failure: Secondary | ICD-10-CM

## 2016-09-14 DIAGNOSIS — Z95810 Presence of automatic (implantable) cardiac defibrillator: Secondary | ICD-10-CM | POA: Diagnosis not present

## 2016-09-14 NOTE — Telephone Encounter (Signed)
We could try questran 4 gm once daily in juice or water - needs to be at least 2 hours away from any of her meds.. Then keep her follow up appt which I believe is with Nandigam

## 2016-09-14 NOTE — Progress Notes (Signed)
EPIC Encounter for ICM Monitoring  Patient Name: Grace Robinson is a 82 y.o. female Date: 09/14/2016 Primary Care Physican: Precious Reel, MD Primary Cardiologist: Truitt Merle, NP Electrophysiologist: Faustino Congress Weight:130 lbs Bi-V Pacing: 98.4%                                               Heart Failure questions reviewed, pt asymptomatic   Thoracic impedance normal   Recommendations:  No changes.  Advised to limit salt intake to 2000 mg daily.  Encouraged to call for fluid symptoms and provided ICM number.    Follow-up plan: ICM clinic phone appointment on 10/15/2016.  Office visit with Truitt Merle, NP on 10/13/2016  Copy of ICM check sent to primary cardiologist and device physician.   ICM trend: 09/14/2016      [name], RN 09/14/2016 12:35 PM

## 2016-09-15 ENCOUNTER — Other Ambulatory Visit: Payer: Self-pay

## 2016-09-15 DIAGNOSIS — L603 Nail dystrophy: Secondary | ICD-10-CM | POA: Diagnosis not present

## 2016-09-15 DIAGNOSIS — I739 Peripheral vascular disease, unspecified: Secondary | ICD-10-CM | POA: Diagnosis not present

## 2016-09-15 DIAGNOSIS — L84 Corns and callosities: Secondary | ICD-10-CM | POA: Diagnosis not present

## 2016-09-15 DIAGNOSIS — E1151 Type 2 diabetes mellitus with diabetic peripheral angiopathy without gangrene: Secondary | ICD-10-CM | POA: Diagnosis not present

## 2016-09-15 MED ORDER — CHOLESTYRAMINE 4 G PO PACK: 4.0000 g | PACK | Freq: Every day | ORAL | 12 refills | Status: DC

## 2016-09-15 NOTE — Telephone Encounter (Signed)
Spoke with the patient and discussed. She agrees with this plan. Explained how to take the medication and what it's original use was.

## 2016-09-16 LAB — PANCREATIC ELASTASE, FECAL: Pancreatic Elastase-1, Stool: 276 mcg/g

## 2016-10-06 DIAGNOSIS — Z23 Encounter for immunization: Secondary | ICD-10-CM | POA: Diagnosis not present

## 2016-10-12 NOTE — Progress Notes (Signed)
CARDIOLOGY OFFICE NOTE  Date:  10/13/2016    [name] Date of Birth: [birth year] Medical Record [id number]  PCP:  Precious Reel, MD  Cardiologist:  [facility]och[facility]   Chief Complaint  Patient presents with  . Hypertension  . Cardiomyopathy    Follow up visit - seen for Dr. Tylene Fantasia    History of Present Illness: AUBRIE Robinson is a 82 y.o. female who presents today for a follow up visit. Seen for Dr. Percival Spanish and Dr. Caryl Comes.   She has an nonischemic CM with remote EF down to 25% that has improved with aggressive medical therapy and CRT-D. Last generator change in 2014. Other issues include HLD, IDDM, ulcerative colitis, LBBB, Meniere's disease and advanced age. Last echo back in 07/2014 showed her EF had normalized at 50 to 55%.   I last saw her back in May - she was doing well - but limited by knee pain from remote fall/accident - someone had [place]pped [place] in the airport. Cardiac status was ok.   I saw her back in September - having more swelling - was on more Norvasc. I changed her medicines around. BNP not all that impressive. Swelling resolved. Doing much better on return visit back in October. BP was up at her last visit but did trend back down with monitoring at home.   Comes back today. Here alone. Very happy with her visit with [name] with GI. Diarrhea totally resolved. She can be more active and social now without fear. Asking about follow up with GI. Cardiac status is ok. BP is good. No chest pain. Breathing is good. She will tire easily at times but stops and rests and does well.   Past Medical History:  Diagnosis Date  . Biventricular ICD (implantable cardiac defibrillator) in place   . Cellulitis   . Change in bowel habits   . CHF (congestive heart failure) ([place])   . Colon polyps   . Diabetes mellitus    Type II  . Diarrhea   . Dyslipidemia   . HOH (hard of hearing)    left  . HTN (hypertension)   . Ischemic cardiomyopathy   .  LBBB (left bundle branch block)    chronic  . Meniere's disease   . T wave oversensing 01/17/2014  . Ulcerative colitis   . Varicose veins    4 lazer  treatment each leg    Past Surgical History:  Procedure Laterality Date  . ABDOMINAL HYSTERECTOMY    . ABDOMINAL HYSTERECTOMY    . CARDIAC CATHETERIZATION  08/2011  . CHOLECYSTECTOMY    . COLONOSCOPY    . EP Study  08/22/08  . IMPLANTABLE CARDIOVERTER DEFIBRILLATOR GENERATOR CHANGE N/A 08/22/2013   Procedure: IMPLANTABLE CARDIOVERTER DEFIBRILLATOR GENERATOR CHANGE;  Surgeon: Deboraha Sprang, MD;  Location: [facility] CATH LAB;  Service: Cardiovascular;  Laterality: N/A;  . KNEE ARTHROSCOPY Left 04/14/2016   Procedure: LEFT KNEE ARTHROSCOPY, PARTIAL MEDIAL AND PARTIAL LATERAL MENISCECTOMY, MEDIAL PLICA;  Surgeon: Dorna Leitz, MD;  Location: [place];  Service: Orthopedics;  Laterality: Left;  . mastoid sugery     in the setting of Meniere's disease  . PACEMAKER INSERTION     Medtronic, remote- no.   . pericardial effusion  12/2002  . TONSILLECTOMY AND ADENOIDECTOMY       Medications: Current Outpatient Prescriptions  Medication Sig Dispense Refill  . acetaminophen (TYLENOL) 650 MG CR tablet Take 650 mg by mouth as needed for pain.    Marland Kitchen  aspirin EC 81 MG tablet Take 1 tablet (81 mg total) by mouth daily.    Marland Kitchen atorvastatin (LIPITOR) 40 MG tablet Take 40 mg by mouth daily.  3  . B Complex-C-Folic Acid (EQL SUPER B COMPLEX/VITAMIN C PO) Take 1 tablet by mouth daily.    . Biotin (BIOTIN 5000) 5 MG CAPS Take 1 capsule by mouth daily.    Marland Kitchen CALCIUM-MAG-VIT C-VIT D PO Take 1 tablet by mouth daily.    . cholestyramine (QUESTRAN) 4 g packet Take 1 packet (4 g total) by mouth daily. Do not take within 2 hours of other medication 60 each 12  . dicyclomine (BENTYL) 10 MG capsule Take 1 tab 30 min before meals 90 capsule 6  . furosemide (LASIX) 40 MG tablet Take 40 mg by mouth daily. 60 mg for one week - then back to 40 mg a day    . HUMALOG MIX 75/25  (75-25) 100 UNIT/ML SUSP injection Inject 15 Units into the skin 2 (two) times daily with a meal. Over 200 take (45 units) break up in three part to take 3 times daily  1  . losartan (COZAAR) 100 MG tablet Take 100 mg by mouth daily.    . metoprolol succinate (TOPROL-XL) 100 MG 24 hr tablet Take 1 tablet by mouth 2 (two) times daily.    . potassium chloride (K-DUR) 10 MEQ tablet Take 10 mEq by mouth. 2 tablets daily (20 mEq total)    . saccharomyces boulardii (FLORASTOR) 250 MG capsule Take 1 capsule (250 mg total) by mouth 2 (two) times daily. 60 capsule 1  . sitaGLIPtin (JANUVIA) 100 MG tablet Take 100 mg by mouth daily.     No current facility-administered medications for this visit.     Allergies: Allergies  Allergen Reactions  . Carvedilol Other (See Comments)  . Vancomycin Other (See Comments)  . Sudafed [Pseudoephedrine] Other (See Comments)    Makes skin feel like its crawling    Social History: The patient  reports that she has quit smoking. She quit after 1.00 year of use. She has never used smokeless tobacco. She reports that she does not drink alcohol or use drugs.   Family History: The patient's family history includes Breast cancer in her mother; Emphysema (age of onset: 75) in her father; Heart disease in her father; Lung cancer in her mother.   Review of Systems: Please see the history of present illness.   Otherwise, the review of systems is positive for none.   All other systems are reviewed and negative.   Physical Exam: VS:  BP 140/80   Pulse 73   Ht 5' 6.5" (1.689 m)   Wt 132 lb 6.4 oz (60.1 kg)   SpO2 99%   BMI 21.05 kg/m  .  BMI Body mass index is 21.05 kg/m.  Wt Readings from Last 3 Encounters:  10/13/16 132 lb 6.4 oz (60.1 kg)  09/06/16 131 lb 2 oz (59.5 kg)  08/27/16 135 lb (61.2 kg)    General: Pleasant. Well developed, well nourished and in no acute distress. She looks younger than her stated age.   HEENT: Normal.  Neck: Supple, no JVD,  carotid bruits, or masses noted.  Cardiac: Regular rate and rhythm. No murmurs, rubs, or gallops. No edema.  Respiratory:  Lungs are clear to auscultation bilaterally with normal work of breathing.  GI: Soft and nontender.  MS: No deformity or atrophy. Gait and ROM intact.  Skin: Warm and dry. Color is normal.  Neuro:  Strength and sensation are intact and no gross focal deficits noted.  Psych: Alert, appropriate and with normal affect.   LABORATORY DATA:  EKG:  EKG is not ordered today.  Lab Results  Component Value Date   WBC 5.8 09/06/2016   HGB 13.6 09/06/2016   HCT 39.7 09/06/2016   PLT 220.0 09/06/2016   GLUCOSE 237 (H) 07/30/2016   ALT 19 07/30/2016   AST 22 07/30/2016   NA 142 07/30/2016   K 3.9 07/30/2016   CL 103 07/30/2016   CREATININE 0.81 07/30/2016   BUN 17 07/30/2016   CO2 27 07/30/2016   TSH 1.23 07/20/2011   INR 1.1 (H) 08/13/2013    BNP (last 3 results)  Recent Labs  07/30/16 0841  BNP 119.8*    ProBNP (last 3 results) No results for input(s): PROBNP in the last 8760 hours.   Other Studies Reviewed Today:  Echo Study Conclusions from 07/2014  - Left ventricle: The cavity size was normal. Systolic function was normal. The estimated ejection fraction was in the range of 50% to 55%. There was an increased relative contribution of atrial contraction to ventricular filling. Doppler parameters are consistent with abnormal left ventricular relaxation (grade 1 diastolic dysfunction). - Ventricular septum: Septal motion showed paradox. These changes are consistent with right ventricular pacing. - Aortic valve: There was mild regurgitation. - Mitral valve: There is a calcified mobile density in the LV cavity most consistent with calcified redundant chordae tendinae. This was present on echo in 2014. There was mild regurgitation. - Pulmonic valve: There was trivial regurgitation.  Assessment / Plan: 1. Volume overload - this has  now resolved with cessation of her Norvasc.   2. NICM - with normalization of her EF - 50 to 55% by echo from September of 2015 - would continue to hold on repeating her echo for now. She is doing very well from our standpoint.   3. Underlying CRT-D - followed by Dr. Caryl Comes  4. HTN -  BP ok at this time. No change with her current regimen.   5. Advanced age - overall, she does remarkably well.   Current medicines are reviewed with the patient today.  The patient does not have concerns regarding medicines other than what has been noted above.  The following changes have been made:  See above.  Labs/ tests ordered today include:   No orders of the defined types were placed in this encounter.     Disposition:   FU with me in 4 months.   Patient is agreeable to this plan and will call if any problems develop in the interim.   Signed: Burtis Junes, RN, ANP-C 10/13/2016 2:05 PM  [facility] [address] [place], [place]  [address] Phone: [phone] Fax: [phone]

## 2016-10-13 ENCOUNTER — Encounter (INDEPENDENT_AMBULATORY_CARE_PROVIDER_SITE_OTHER): Payer: Self-pay

## 2016-10-13 ENCOUNTER — Encounter: Payer: Self-pay | Admitting: Nurse Practitioner

## 2016-10-13 ENCOUNTER — Ambulatory Visit (INDEPENDENT_AMBULATORY_CARE_PROVIDER_SITE_OTHER): Payer: Medicare Other | Admitting: Nurse Practitioner

## 2016-10-13 VITALS — BP 140/80 | HR 73 | Ht 66.5 in | Wt 132.4 lb

## 2016-10-13 DIAGNOSIS — Z95810 Presence of automatic (implantable) cardiac defibrillator: Secondary | ICD-10-CM

## 2016-10-13 DIAGNOSIS — I10 Essential (primary) hypertension: Secondary | ICD-10-CM | POA: Diagnosis not present

## 2016-10-13 DIAGNOSIS — I5022 Chronic systolic (congestive) heart failure: Secondary | ICD-10-CM | POA: Diagnosis not present

## 2016-10-13 NOTE — Patient Instructions (Addendum)
We will be checking the following labs today - NONE   Medication Instructions:    Continue with your current medicines.     Testing/Procedures To Be Arranged:  N/A  Follow-Up:   See me in 4 months    Other Special Instructions:   N/A    If you need a refill on your cardiac medications before your next appointment, please call your pharmacy.   Call the [facility] office at [phone] if you have any questions, problems or concerns.      

## 2016-10-15 ENCOUNTER — Ambulatory Visit (INDEPENDENT_AMBULATORY_CARE_PROVIDER_SITE_OTHER): Payer: Medicare Other

## 2016-10-15 DIAGNOSIS — I5022 Chronic systolic (congestive) heart failure: Secondary | ICD-10-CM

## 2016-10-15 DIAGNOSIS — Z95810 Presence of automatic (implantable) cardiac defibrillator: Secondary | ICD-10-CM | POA: Diagnosis not present

## 2016-10-15 NOTE — Progress Notes (Signed)
EPIC Encounter for ICM Monitoring  Patient Name: Grace Robinson is a 82 y.o. female Date: 10/15/2016 Primary Care Physican: Precious Reel, MD Primary Cardiologist: Truitt Merle, NP Electrophysiologist: Faustino Congress Weight:130 lbs Bi-V Pacing: 98.5%       Heart Failure questions reviewed, pt asymptomatic   Thoracic impedance abnormal suggesting fluid accumulation.  Recommendations:  She takes Furosemide 40 mg every other day alternating with 20 mg and Potassium 10 meq 1 tablet bid.  Recommended to increase Furosemide 40 mg to 1 tablet daily for x 5 days and Potassium 10 meq 2 tablets in am and 1 tablet in pm x 5 days.  Return to prior dosages of Furosemide and Potassium after 5th day.    Follow-up plan: ICM clinic phone appointment on 10/21/2016 to recheck fluid levels.  Office appointment with Dr Caryl Comes 11/16/2016.    Copy of ICM check sent to primary cardiologist and device physician.   ICM trend: 10/15/2016        [name], RN 10/15/2016 4:26 PM

## 2016-10-21 ENCOUNTER — Telehealth: Payer: Self-pay | Admitting: Cardiology

## 2016-10-21 ENCOUNTER — Ambulatory Visit (INDEPENDENT_AMBULATORY_CARE_PROVIDER_SITE_OTHER): Payer: Medicare Other

## 2016-10-21 DIAGNOSIS — I5022 Chronic systolic (congestive) heart failure: Secondary | ICD-10-CM

## 2016-10-21 DIAGNOSIS — Z95810 Presence of automatic (implantable) cardiac defibrillator: Secondary | ICD-10-CM

## 2016-10-21 NOTE — Progress Notes (Signed)
EPIC Encounter for ICM Monitoring  Patient Name: Grace Robinson is a 82 y.o. female Date: 10/21/2016 Primary Care Physican: Precious Reel, MD Primary Cardiologist: Truitt Merle, NP Electrophysiologist: Faustino Congress Weight:130 lbs Bi-V Pacing: 98.5%          Heart Failure questions reviewed, pt asymptomatic   Thoracic impedance returned to normal after taking increase in Furosemide as recommended 12/15.  Recommendations: No changes.  Reinforced to limit low salt food choices to 2000 mg day and limiting fluid intake to < 2 liters per day. Encouraged to call for fluid symptoms.    Follow-up plan: ICM clinic phone appointment on 12/17/2016.  Office appt with Dr Caryl Comes 11/16/2016.  Copy of ICM check sent to device physician.   ICM trend: 10/22/2016       [name], RN 10/21/2016 6:01 PM

## 2016-10-21 NOTE — Telephone Encounter (Signed)
Spoke with pt and reminded pt of remote transmission that is due today. Pt verbalized understanding.   

## 2016-10-28 ENCOUNTER — Encounter: Payer: Self-pay | Admitting: Physician Assistant

## 2016-10-28 ENCOUNTER — Ambulatory Visit (INDEPENDENT_AMBULATORY_CARE_PROVIDER_SITE_OTHER): Payer: Medicare Other | Admitting: Physician Assistant

## 2016-10-28 VITALS — BP 126/70 | HR 68 | Ht 66.5 in | Wt 132.4 lb

## 2016-10-28 DIAGNOSIS — K580 Irritable bowel syndrome with diarrhea: Secondary | ICD-10-CM | POA: Diagnosis not present

## 2016-10-28 MED ORDER — SACCHAROMYCES BOULARDII 250 MG PO CAPS: ORAL_CAPSULE | ORAL | 11 refills | Status: DC

## 2016-10-28 MED ORDER — DICYCLOMINE HCL 10 MG PO CAPS: ORAL_CAPSULE | ORAL | 11 refills | Status: DC

## 2016-10-28 NOTE — Patient Instructions (Addendum)
We sent refills to your pharmacy,  [facility] [place].  1. FLorastor  2. Bentyl ( Dicyclomine) 10 mg.   Follow up with [name] PA-C in one year or sooner if needed.

## 2016-10-28 NOTE — Progress Notes (Signed)
Subjective:    Patient ID: Grace Robinson, female    DOB: 10/09/1926, 82 y.o.   MRN: [id number]  HPI Grace Robinson is a very nice 82 year old white female who was initially seen by myself on 09/06/2016 for chronic diarrhea. She comes back in today for follow-up. Patient had history of long-term issues with episodic diarrhea and fecal urgency, as well as periodic episodes of incontinence. At the time of that office visit she had been having ongoing problems with diarrhea for about 3 weeks. She had been given a course of metronidazole by her PCP without any improvement. She had had prior colonoscopy probably 15 years ago which was negative. GI pathogen panel was done which was negative, stool for lactoferrin negative fecal elastase negative. CRP was normal and sedimentation rate was elevated at 48. She was started on a course of Bentyl 10 mg before meals and Florastor once daily. She has done extremely well and is very pleased with her progress. She says she hasn't felt this well for years and is not having any episodes of diarrhea or urgency. He feels that her quality of life is much improved because she can go out and eat etc. She has no current complaints of abdominal pain or cramping and her appetite is good.  Review of Systems Pertinent positive and negative review of systems were noted in the above HPI section.  All other review of systems was otherwise negative.  Outpatient Encounter Prescriptions as of 10/28/2016  Medication Sig  . acetaminophen (TYLENOL) 650 MG CR tablet Take 650 mg by mouth as needed for pain.  Marland Kitchen aspirin EC 81 MG tablet Take 1 tablet (81 mg total) by mouth daily.  Marland Kitchen atorvastatin (LIPITOR) 40 MG tablet Take 40 mg by mouth daily.  . Biotin (BIOTIN 5000) 5 MG CAPS Take 1 capsule by mouth daily.  . cholestyramine (QUESTRAN) 4 g packet Take 1 packet (4 g total) by mouth daily. Do not take within 2 hours of other medication  . dicyclomine (BENTYL) 10 MG capsule Take 1 tab 30 min before  meals  . furosemide (LASIX) 40 MG tablet Take 40 mg by mouth daily. 20mg  tablets - pt takes two in the a.m. And one q p,m  . HUMALOG MIX 75/25 (75-25) 100 UNIT/ML SUSP injection Inject 15 Units into the skin 2 (two) times daily with a meal. Over 200 take (45 units) break up in three part to take 3 times daily  . losartan (COZAAR) 100 MG tablet Take 100 mg by mouth daily.  . metoprolol succinate (TOPROL-XL) 100 MG 24 hr tablet Take 1 tablet by mouth 2 (two) times daily.  . potassium chloride (K-DUR) 10 MEQ tablet Take 10 mEq by mouth. 3 tablets daily (30 mEq total)  . saccharomyces boulardii (FLORASTOR) 250 MG capsule Take 1 tab once daily.  . sitaGLIPtin (JANUVIA) 100 MG tablet Take 100 mg by mouth daily.  . [DISCONTINUED] dicyclomine (BENTYL) 10 MG capsule Take 1 tab 30 min before meals  . [DISCONTINUED] saccharomyces boulardii (FLORASTOR) 250 MG capsule Take 1 capsule (250 mg total) by mouth 2 (two) times daily.  . [DISCONTINUED] B Complex-C-Folic Acid (EQL SUPER B COMPLEX/VITAMIN C PO) Take 1 tablet by mouth daily.  . [DISCONTINUED] CALCIUM-MAG-VIT C-VIT D PO Take 1 tablet by mouth daily.   No facility-administered encounter medications on file as of 10/28/2016.    Allergies  Allergen Reactions  . Carvedilol Other (See Comments)  . Vancomycin Other (See Comments)  . Sudafed [Pseudoephedrine] Other (See  Comments)    Makes skin feel like its crawling   Patient Active Problem List   Diagnosis Date Noted  . Degenerative tear of posterior horn of medial meniscus of left knee 04/14/2016  . Tear of lateral meniscus of left knee 04/14/2016  . Synovial plica of left knee [id number]  . T wave oversensing 01/17/2014  . Fall 07/10/2013  . Paroxysmal ventricular tachycardia ([place]) 12/13/2012  . Biventricular implantable cardioverter-defibrillator -Medtronic 12/08/2011  . Atrial tachycardia ([place]) 12/08/2011  . HTN (hypertension) 05/19/2011  . CARDIOMYOPATHY, PRIMARY, DILATED 12/01/2010    Social History   Social History  . Marital status: Single    Spouse name: N/A  . Number of children: 2  . Years of education: N/A   Occupational History  . retired    Social History Main Topics  . Smoking status: Former Smoker    Years: 1.00  . Smokeless tobacco: Never Used     Comment: quit in 1970  . Alcohol use No  . Drug use: No  . Sexual activity: Not Currently    Birth control/ protection: Post-menopausal   Other Topics Concern  . Not on file   Social History Narrative   Widowed, has 2 children.     Ms. Brinkmann family history includes Breast cancer in her mother; Emphysema (age of onset: 48) in her father; Heart disease in her father; Lung cancer in her mother.      Objective:    Vitals:   10/28/16 1355  BP: 126/70  Pulse: 68    Physical Exam well-developed elderly white female in no acute distress, very pleasant blood pressure 126/70 pulse 68, BMI 21. HEENT nontraumatic normocephalic EOMI PERRLA sclera anicteric,, Extremities no clubbing cyanosis or edema skin warm dry, Neuropsych mood and affect appropriate not further examined today       Assessment & Plan:   #82 82 year old white female with chronic periodic diarrhea and fecal urgency. Symptoms are much improved and very well controlled with Bentyl and Florastor. I suspect she has IBS/D. Infectious workup negative. She did have a mildly elevated sedimentation rate and so also would consider an underlying microscopic colitis. #2 history of cardiomyopathy status post ICD #3 hypertension #4 adult-onset diabetes mellitus  Plan; patient is doing well so we'll continue Bentyl 10 mg 30 minutes before meals. She is reassured that she may stay on this medication Continue Florastor 1 by mouth daily Will plan to follow up on an as-needed basis. Due to her advanced age will not pursue colonoscopy at this time unless she develops refractory diarrhea.   [name] Genia Harold PA-C 10/28/2016   Cc: Shon Baton,  MD

## 2016-10-28 NOTE — Progress Notes (Signed)
Reviewed and agree with documentation and assessment and plan. K. Veena Nandigam , MD   

## 2016-11-09 DIAGNOSIS — I10 Essential (primary) hypertension: Secondary | ICD-10-CM | POA: Diagnosis not present

## 2016-11-09 DIAGNOSIS — M859 Disorder of bone density and structure, unspecified: Secondary | ICD-10-CM | POA: Diagnosis not present

## 2016-11-09 DIAGNOSIS — E1122 Type 2 diabetes mellitus with diabetic chronic kidney disease: Secondary | ICD-10-CM | POA: Diagnosis not present

## 2016-11-09 DIAGNOSIS — E784 Other hyperlipidemia: Secondary | ICD-10-CM | POA: Diagnosis not present

## 2016-11-16 ENCOUNTER — Ambulatory Visit (INDEPENDENT_AMBULATORY_CARE_PROVIDER_SITE_OTHER): Payer: Medicare Other | Admitting: Internal Medicine

## 2016-11-16 ENCOUNTER — Encounter: Payer: Self-pay | Admitting: Internal Medicine

## 2016-11-16 VITALS — BP 120/64 | HR 93 | Ht 66.0 in | Wt 131.8 lb

## 2016-11-16 DIAGNOSIS — Z794 Long term (current) use of insulin: Secondary | ICD-10-CM | POA: Diagnosis not present

## 2016-11-16 DIAGNOSIS — Z95810 Presence of automatic (implantable) cardiac defibrillator: Secondary | ICD-10-CM

## 2016-11-16 DIAGNOSIS — N182 Chronic kidney disease, stage 2 (mild): Secondary | ICD-10-CM | POA: Diagnosis not present

## 2016-11-16 DIAGNOSIS — I472 Ventricular tachycardia: Secondary | ICD-10-CM

## 2016-11-16 DIAGNOSIS — E1122 Type 2 diabetes mellitus with diabetic chronic kidney disease: Secondary | ICD-10-CM | POA: Diagnosis not present

## 2016-11-16 DIAGNOSIS — I4729 Other ventricular tachycardia: Secondary | ICD-10-CM

## 2016-11-16 DIAGNOSIS — I5022 Chronic systolic (congestive) heart failure: Secondary | ICD-10-CM

## 2016-11-16 DIAGNOSIS — Z950 Presence of cardiac pacemaker: Secondary | ICD-10-CM | POA: Diagnosis not present

## 2016-11-16 DIAGNOSIS — I428 Other cardiomyopathies: Secondary | ICD-10-CM

## 2016-11-16 DIAGNOSIS — I130 Hypertensive heart and chronic kidney disease with heart failure and stage 1 through stage 4 chronic kidney disease, or unspecified chronic kidney disease: Secondary | ICD-10-CM | POA: Diagnosis not present

## 2016-11-16 DIAGNOSIS — Z0000 Encounter for general adult medical examination without abnormal findings: Secondary | ICD-10-CM | POA: Diagnosis not present

## 2016-11-16 DIAGNOSIS — I2510 Atherosclerotic heart disease of native coronary artery without angina pectoris: Secondary | ICD-10-CM | POA: Diagnosis not present

## 2016-11-16 DIAGNOSIS — K5289 Other specified noninfective gastroenteritis and colitis: Secondary | ICD-10-CM | POA: Diagnosis not present

## 2016-11-16 DIAGNOSIS — Z6821 Body mass index (BMI) 21.0-21.9, adult: Secondary | ICD-10-CM | POA: Diagnosis not present

## 2016-11-16 DIAGNOSIS — I420 Dilated cardiomyopathy: Secondary | ICD-10-CM | POA: Diagnosis not present

## 2016-11-16 NOTE — Progress Notes (Signed)
D     Patient Care Team: Shon Baton, MD as PCP - General (Internal Medicine)   HPI  Grace Robinson is a 83 y.o. female is seen in followup for nonischemic cardiomyopathy complicated by congestive heart failure. She is status post CRT-D implantation was further compromised by a 6949-lead which failed prompting [place] to pirate her previously implanted pacemaker lead . This was done in 2007  There have been intercurrent normalization of LV functionwith an echo 2014 demonstrating near normal left ventricular function and mild AV valvular regurgitation  this was confirmed 9/15;  She underwent generator replacement 0ct 2014; she's had some tingling over the incision which is somewhat better but still persists. There has been no erythema.   The patient denies chest pain, There have been no palpitations, lightheadedness or syncope        Past Medical History:  Diagnosis Date  . Biventricular ICD (implantable cardiac defibrillator) in place   . Cellulitis   . Change in bowel habits   . CHF (congestive heart failure) ([place])   . Colon polyps   . Diabetes mellitus    Type II  . Diarrhea   . Dyslipidemia   . HOH (hard of hearing)    left  . HTN (hypertension)   . Ischemic cardiomyopathy   . LBBB (left bundle branch block)    chronic  . Meniere's disease   . T wave oversensing 01/17/2014  . Ulcerative colitis   . Varicose veins    4 lazer  treatment each leg    Past Surgical History:  Procedure Laterality Date  . ABDOMINAL HYSTERECTOMY    . ABDOMINAL HYSTERECTOMY    . CARDIAC CATHETERIZATION  08/2011  . CHOLECYSTECTOMY    . COLONOSCOPY    . EP Study  08/22/08  . IMPLANTABLE CARDIOVERTER DEFIBRILLATOR GENERATOR CHANGE N/A 08/22/2013   Procedure: IMPLANTABLE CARDIOVERTER DEFIBRILLATOR GENERATOR CHANGE;  Surgeon: Deboraha Sprang, MD;  [facility] C[facility] LAB;  Service: Cardiovascular;  Laterality: N/A;  . KNEE ARTHROSCOPY Left 04/14/2016   Procedure: LEFT KNEE ARTHROSCOPY,  PARTIAL MEDIAL AND PARTIAL LATERAL MENISCECTOMY, MEDIAL PLICA;  Surgeon: Dorna Leitz, MD;  Location: [place];  Service: Orthopedics;  Laterality: Left;  . mastoid sugery     in the setting of Meniere's disease  . PACEMAKER INSERTION     Medtronic, remote- no.   . pericardial effusion  12/2002  . TONSILLECTOMY AND ADENOIDECTOMY      Current Outpatient Prescriptions  Medication Sig Dispense Refill  . acetaminophen (TYLENOL) 650 MG CR tablet Take 650 mg by mouth as needed for pain.    Marland Kitchen aspirin EC 81 MG tablet Take 1 tablet (81 mg total) by mouth daily.    Marland Kitchen atorvastatin (LIPITOR) 40 MG tablet Take 40 mg by mouth daily.  3  . cholestyramine (QUESTRAN) 4 g packet Take 1 packet (4 g total) by mouth daily. Do not take within 2 hours of other medication 60 each 12  . dicyclomine (BENTYL) 10 MG capsule Take 1 tab 30 min before meals 90 capsule 11  . furosemide (LASIX) 40 MG tablet Take 40 mg by mouth daily. 20mg  tablets - pt takes two in the a.m. And one q p,m    . HUMALOG MIX 75/25 (75-25) 100 UNIT/ML SUSP injection Inject 15 Units into the skin 2 (two) times daily with a meal. Over 200 take (45 units) break up in three part to take 3 times daily  1  . losartan (COZAAR) 100 MG  tablet Take 100 mg by mouth daily.    . metoprolol succinate (TOPROL-XL) 100 MG 24 hr tablet Take 1 tablet by mouth 2 (two) times daily.    . potassium chloride (K-DUR) 10 MEQ tablet Take 10 mEq by mouth. 3 tablets daily (30 mEq total)    . saccharomyces boulardii (FLORASTOR) 250 MG capsule Take 1 tab once daily. 30 capsule 11  . sitaGLIPtin (JANUVIA) 100 MG tablet Take 100 mg by mouth daily.     No current facility-administered medications for this visit.     Allergies  Allergen Reactions  . Carvedilol Other (See Comments)  . Vancomycin Other (See Comments)  . Sudafed [Pseudoephedrine] Other (See Comments)    Makes skin feel like its crawling    Review of Systems negative except from HPI and PMH  Physical Exam BP  120/64   Pulse 93   Ht 5\' 6"  (1.676 m)   Wt 131 lb 12.8 oz (59.8 kg)   SpO2 98%   BMI 21.27 kg/m  Well developed and well nourished in no acute distress HENT normal E scleral and icterus clear Neck Supple JVP flat; carotids brisk and full Clear to ausculation  Regular rate and rhythm, no murmurs gallops or rub Soft with active bowel sounds No clubbing cyanosis   Edema Alert and oriented, grossly normal motor and sensory function Skin Warm and Dry  ECG demonstrates AV pacing  Assessment and  Plan  Nonischemic cardiomyopathy -resolved  Congestive heart failure ? HFpEF  CRT-D-Medtronic  hyperension   Ventricular tachycardia  2 episodes of intercurrent ventricular tachycardia treated with antitachycardia pacing  She has done exceedingly well. We'll continue her on her current medications.  Blood pressure is much better.  Euvolemic continue current meds  We'll get labs from Dr. Keane Police office

## 2016-11-16 NOTE — Patient Instructions (Signed)
Medication Instructions: - Your physician recommends that you continue on your current medications as directed. Please refer to the Current Medication list given to you today.  Labwork: - none ordered  Procedures/Testing: - none ordered  Follow-Up: - Remote monitoring is used to monitor your Pacemaker of ICD from home. This monitoring reduces the number of office visits required to check your device to one time per year. It allows [place] to keep an eye on the functioning of your device to ensure it is working properly. You are scheduled for a device check from home on 02/15/17. You may send your transmission at any time that day. If you have a wireless device, the transmission will be sent automatically. After your physician reviews your transmission, you will receive a postcard with your next transmission date.  - Your physician wants you to follow-up in: 1 year with Chanetta Marshall, NP for Dr. Caryl Comes. You will receive a reminder letter in the mail two months in advance. If you don't receive a letter, please call our office to schedule the follow-up appointment.  Any Additional Special Instructions Will Be Listed Below (If Applicable).     If you need a refill on your cardiac medications before your next appointment, please call your pharmacy.

## 2016-11-19 LAB — CUP PACEART INCLINIC DEVICE CHECK
Battery Remaining Longevity: 71 mo
Brady Statistic AP VS Percent: 0.37 %
Brady Statistic AS VS Percent: 1.04 %
Brady Statistic RV Percent Paced: 0.55 %
Date Time Interrogation Session: 20180116202222
HIGH POWER IMPEDANCE MEASURED VALUE: 54 Ohm
HighPow Impedance: 78 Ohm
Implantable Lead Implant Date: 20060227
Implantable Lead Location: 753859
Implantable Lead Location: 753860
Implantable Lead Model: 4193
Implantable Lead Model: 4469
Implantable Lead Model: 4470
Implantable Lead Serial Number: 337988
Lead Channel Impedance Value: 361 Ohm
Lead Channel Impedance Value: 4047 Ohm
Lead Channel Impedance Value: 475 Ohm
Lead Channel Pacing Threshold Amplitude: 0.75 V
Lead Channel Pacing Threshold Amplitude: 0.75 V
Lead Channel Pacing Threshold Pulse Width: 0.4 ms
Lead Channel Sensing Intrinsic Amplitude: 27.875 mV
Lead Channel Sensing Intrinsic Amplitude: 31.625 mV
Lead Channel Setting Pacing Amplitude: 2 V
Lead Channel Setting Pacing Amplitude: 2.5 V
Lead Channel Setting Pacing Pulse Width: 0.4 ms
Lead Channel Setting Pacing Pulse Width: 0.4 ms
MDC IDC LEAD IMPLANT DT: 20031007
MDC IDC LEAD IMPLANT DT: 20031007
MDC IDC LEAD IMPLANT DT: 20031007
MDC IDC LEAD LOCATION: 753858
MDC IDC LEAD LOCATION: 753860
MDC IDC LEAD SERIAL: 361236
MDC IDC MSMT BATTERY VOLTAGE: 2.99 V
MDC IDC MSMT LEADCHNL LV IMPEDANCE VALUE: 4047 Ohm
MDC IDC MSMT LEADCHNL LV IMPEDANCE VALUE: 513 Ohm
MDC IDC MSMT LEADCHNL RA IMPEDANCE VALUE: 475 Ohm
MDC IDC MSMT LEADCHNL RA PACING THRESHOLD AMPLITUDE: 0.75 V
MDC IDC MSMT LEADCHNL RA PACING THRESHOLD PULSEWIDTH: 0.4 ms
MDC IDC MSMT LEADCHNL RA SENSING INTR AMPL: 1.375 mV
MDC IDC MSMT LEADCHNL RA SENSING INTR AMPL: 2.125 mV
MDC IDC MSMT LEADCHNL RV PACING THRESHOLD PULSEWIDTH: 0.4 ms
MDC IDC PG IMPLANT DT: 20141022
MDC IDC SET LEADCHNL RA PACING AMPLITUDE: 2 V
MDC IDC SET LEADCHNL RV SENSING SENSITIVITY: 0.6 mV
MDC IDC STAT BRADY AP VP PERCENT: 26.76 %
MDC IDC STAT BRADY AS VP PERCENT: 71.82 %
MDC IDC STAT BRADY RA PERCENT PACED: 27.09 %

## 2016-11-22 DIAGNOSIS — I739 Peripheral vascular disease, unspecified: Secondary | ICD-10-CM | POA: Diagnosis not present

## 2016-11-22 DIAGNOSIS — L603 Nail dystrophy: Secondary | ICD-10-CM | POA: Diagnosis not present

## 2016-11-22 DIAGNOSIS — L84 Corns and callosities: Secondary | ICD-10-CM | POA: Diagnosis not present

## 2016-11-22 DIAGNOSIS — E1151 Type 2 diabetes mellitus with diabetic peripheral angiopathy without gangrene: Secondary | ICD-10-CM | POA: Diagnosis not present

## 2016-12-17 ENCOUNTER — Ambulatory Visit (INDEPENDENT_AMBULATORY_CARE_PROVIDER_SITE_OTHER): Payer: Medicare Other

## 2016-12-17 DIAGNOSIS — I5022 Chronic systolic (congestive) heart failure: Secondary | ICD-10-CM | POA: Diagnosis not present

## 2016-12-17 DIAGNOSIS — Z95810 Presence of automatic (implantable) cardiac defibrillator: Secondary | ICD-10-CM | POA: Diagnosis not present

## 2016-12-17 NOTE — Progress Notes (Signed)
EPIC Encounter for ICM Monitoring  Patient Name: CELYNA ONA is a 83 y.o. female Date: 12/17/2016 Primary Care Physican: Precious Reel, MD Primary Cardiologist: Truitt Merle, NP Electrophysiologist: Caryl Comes Dry Weight:130 lbs Bi-V Pacing: 98.4%      Heart Failure questions reviewed, pt asymptomatic.    Thoracic impedance normal.   Current prescribed dose of Furosemide 20 mg 2 tablets (40 mg total) and 1 tablet (20 mg) pm.  Recommendations: No changes. Reminded to limit dietary salt intake to 2000 mg/day and fluid intake to < 2 liters/day. Encouraged to call for fluid symptoms.  Follow-up plan: ICM clinic phone appointment on 01/17/2017.  Copy of ICM check sent to device physician.   3 month ICM trend: 12/17/2016   1 Year ICM trend:      [name], RN 12/17/2016 10:00 AM

## 2016-12-27 DIAGNOSIS — E1151 Type 2 diabetes mellitus with diabetic peripheral angiopathy without gangrene: Secondary | ICD-10-CM | POA: Diagnosis not present

## 2016-12-27 DIAGNOSIS — L603 Nail dystrophy: Secondary | ICD-10-CM | POA: Diagnosis not present

## 2016-12-27 DIAGNOSIS — I739 Peripheral vascular disease, unspecified: Secondary | ICD-10-CM | POA: Diagnosis not present

## 2017-01-17 ENCOUNTER — Ambulatory Visit (INDEPENDENT_AMBULATORY_CARE_PROVIDER_SITE_OTHER): Payer: Medicare Other

## 2017-01-17 DIAGNOSIS — I5022 Chronic systolic (congestive) heart failure: Secondary | ICD-10-CM

## 2017-01-17 DIAGNOSIS — Z95810 Presence of automatic (implantable) cardiac defibrillator: Secondary | ICD-10-CM

## 2017-01-17 NOTE — Progress Notes (Signed)
EPIC Encounter for ICM Monitoring  Patient Name: Grace Robinson is a 83 y.o. female Date: 01/17/2017 Primary Care Physican: Precious Reel, MD Primary Cardiologist: Truitt Merle, NP Electrophysiologist: Caryl Comes Dry Weight:130 lbs Bi-V Pacing: 98.4%      Heart Failure questions reviewed, pt asymptomatic.   Thoracic impedance normal.  Current prescribed dose of Furosemide 20 mg 2 tablets (40 mg total) and 1 tablet (20 mg) pm.  Potassium 10 mEq 3 tablets daily  Recommendations: No changes. Reminded to limit dietary salt intake to 2000 mg/day and fluid intake to < 2 liters/day. Encouraged to call for fluid symptoms.  Follow-up plan: ICM clinic phone appointment on 02/17/2017.  Office appointment with Truitt Merle, NP on 02/08/2017  Copy of ICM check sent to primary cardiologist and device physician.   3 month ICM trend: 01/17/2017   1 Year ICM trend:      [name], RN 01/17/2017 10:39 AM

## 2017-01-26 ENCOUNTER — Encounter: Payer: Self-pay | Admitting: Nurse Practitioner

## 2017-02-07 NOTE — Progress Notes (Signed)
CARDIOLOGY OFFICE NOTE  Date:  02/08/2017    Grace Robinson Date of Birth: 1926/05/04 Medical Record #[id number]  PCP:  Precious Reel, MD   Cardiologist:  Servando [facility] Hochrein[facility]Klein   Chief Complaint  Patient presents with  . Cardiomyopathy    Follow up visit - seen for Dr. Tylene Fantasia    History of Present Illness: Grace Robinson is a 83 y.o. female who presents today for a 4 month check. Seen for Dr. Percival Spanish and Dr. Caryl Comes. She is a former patient of Dr. Susa Simmonds. Primarily follows with me.   She has an nonischemic CM with remote EF down to 25% that has improved with aggressive medical therapy and CRT-D. Last generator change in 2014. Other issues include HLD, IDDM, ulcerative colitis, LBBB, Meniere's disease and advanced age. Last echo back in 07/2014 showed her EF had normalized at 50 to 55%.   I have seen her back several times. She has basically done pretty well from our standpoint. Back in September - having more swelling - was on more Norvasc. I changed her medicines around. BNP not all that impressive. Swelling resolved. Doing much better on return visit back in October. BP was up at her last visit but did trend back down with monitoring at home. I last saw her back in December. She had been to GI for chronic diarrhea and was happy with her treatment from [facility]. Saw Dr. Caryl Comes for her device check in January.   Comes back today. Here alone. She has been doing very well. She has been on trip to [place] since I saw her - she flew without any issue. Going to the beach in June for a family vacation. No chest pain. Breathing is good. Not dizzy. No more swelling in her feet. Her diarrhea is in check - she is very happy about this and this has allowed her to be more social. She has no real concerns today. Her dose of lasix is somewhat questionable - she will call back the actual dose she is taking.   Past Medical History:  Diagnosis Date  . Biventricular ICD (implantable  cardiac defibrillator) in place   . Cellulitis   . Change in bowel habits   . CHF (congestive heart failure) ([place])   . Colon polyps   . Diabetes mellitus    Type II  . Diarrhea   . Dyslipidemia   . HOH (hard of hearing)    left  . HTN (hypertension)   . Ischemic cardiomyopathy   . LBBB (left bundle branch block)    chronic  . Meniere's disease   . T wave oversensing 01/17/2014  . Ulcerative colitis   . Varicose veins    4 lazer  treatment each leg    Past Surgical History:  Procedure Laterality Date  . ABDOMINAL HYSTERECTOMY    . ABDOMINAL HYSTERECTOMY    . CARDIAC CATHETERIZATION  08/2011  . CHOLECYSTECTOMY    . COLONOSCOPY    . EP Study  08/22/08  . IMPLANTABLE CARDIOVERTER DEFIBRILLATOR GENERATOR CHANGE N/A 08/22/2013   Procedure: IMPLANTABLE CARDIOVERTER DEFIBRILLATOR GENERATOR CHANGE;  Surgeon: Deboraha Sprang, MD;  Location[facility] CATH LAB;  Service: Cardiovascular;  Laterality: N/A;  . KNEE ARTHROSCOPY Left 04/14/2016   Procedure: LEFT KNEE ARTHROSCOPY, PARTIAL MEDIAL AND PARTIAL LATERAL MENISCECTOMY, MEDIAL PLICA;  Surgeon: Dorna Leitz, MD;  Location: [place];  Service: Orthopedics;  Laterality: Left;  . mastoid sugery     in the setting of Meniere's disease  .  PACEMAKER INSERTION     Medtronic, remote- no.   . pericardial effusion  12/2002  . TONSILLECTOMY AND ADENOIDECTOMY       Medications: Current Outpatient Prescriptions  Medication Sig Dispense Refill  . acetaminophen (TYLENOL) 650 MG CR tablet Take 650 mg by mouth as needed for pain.    Marland Kitchen aspirin EC 81 MG tablet Take 1 tablet (81 mg total) by mouth daily.    Marland Kitchen atorvastatin (LIPITOR) 40 MG tablet Take 40 mg by mouth daily.  3  . cholestyramine (QUESTRAN) 4 g packet Take 1 packet (4 g total) by mouth daily. Do not take within 2 hours of other medication 60 each 12  . dicyclomine (BENTYL) 10 MG capsule Take 1 tab 30 min before meals 90 capsule 11  . furosemide (LASIX) 40 MG tablet Take 40 mg by mouth daily. 20mg   tablets - pt takes two in the a.m. And one q p,m    . HUMALOG MIX 75/25 (75-25) 100 UNIT/ML SUSP injection Inject 15 Units into the skin 2 (two) times daily with a meal. Over 200 take (45 units) break up in three part to take 3 times daily  1  . losartan (COZAAR) 100 MG tablet Take 1 tablet (100 mg total) by mouth daily. 90 tablet 3  . metoprolol succinate (TOPROL-XL) 100 MG 24 hr tablet Take 1 tablet by mouth 2 (two) times daily.    . potassium chloride (K-DUR) 10 MEQ tablet Take 10 mEq by mouth. 3 tablets daily (30 mEq total)    . saccharomyces boulardii (FLORASTOR) 250 MG capsule Take 1 tab once daily. 30 capsule 11  . sitaGLIPtin (JANUVIA) 100 MG tablet Take 100 mg by mouth daily.     No current facility-administered medications for this visit.     Allergies: Allergies  Allergen Reactions  . Carvedilol Other (See Comments)  . Vancomycin Other (See Comments)  . Sudafed [Pseudoephedrine] Other (See Comments)    Makes skin feel like its crawling    Social History: The patient  reports that she has quit smoking. She quit after 1.00 year of use. She has never used smokeless tobacco. She reports that she does not drink alcohol or use drugs.   Family History: The patient's family history includes Breast cancer in her mother; Emphysema (age of onset: 59) in her father; Heart disease in her father; Lung cancer in her mother.   Review of Systems: Please see the history of present illness.   Otherwise, the review of systems is positive for none.   All other systems are reviewed and negative.   Physical Exam: VS:  BP (!) 142/80   Pulse 82   Ht 5' 6.5" (1.689 m)   Wt 129 lb 6.4 oz (58.7 kg)   SpO2 96% Comment: at rest  BMI 20.57 kg/m  .  BMI Body mass index is 20.57 kg/m.  Wt Readings from Last 3 Encounters:  02/08/17 129 lb 6.4 oz (58.7 kg)  11/16/16 131 lb 12.8 oz (59.8 kg)  10/28/16 132 lb 6 oz (60 kg)    General: Pleasant. Elderly female who is alert and in no acute distress.  She looks younger than her stated age. Weight is down 2 pounds.   HEENT: Normal.  Neck: Supple, no JVD, carotid bruits, or masses noted.  Cardiac: Regular rate and rhythm. No murmurs, rubs, or gallops. No edema.  Respiratory:  Lungs are clear to auscultation bilaterally with normal work of breathing.  GI: Soft and nontender.  MS: No  deformity or atrophy. Gait and ROM intact.  Skin: Warm and dry. Color is normal.  Neuro:  Strength and sensation are intact and no gross focal deficits noted.  Psych: Alert, appropriate and with normal affect.   LABORATORY DATA:  EKG:  EKG is not ordered today.  Lab Results  Component Value Date   WBC 5.8 09/06/2016   HGB 13.6 09/06/2016   HCT 39.7 09/06/2016   PLT 220.0 09/06/2016   GLUCOSE 237 (H) 07/30/2016   ALT 19 07/30/2016   AST 22 07/30/2016   NA 142 07/30/2016   K 3.9 07/30/2016   CL 103 07/30/2016   CREATININE 0.81 07/30/2016   BUN 17 07/30/2016   CO2 27 07/30/2016   TSH 1.23 07/20/2011   INR 1.1 (H) 08/13/2013    BNP (last 3 results)  Recent Labs  07/30/16 0841  BNP 119.8*    ProBNP (last 3 results) No results for input(s): PROBNP in the last 8760 hours.   Other Studies Reviewed Today:  Echo Study Conclusions from 07/2014  - Left ventricle: The cavity size was normal. Systolic function was normal. The estimated ejection fraction was in the range of 50% to 55%. There was an increased relative contribution of atrial contraction to ventricular filling. Doppler parameters are consistent with abnormal left ventricular relaxation (grade 1 diastolic dysfunction). - Ventricular septum: Septal motion showed paradox. These changes are consistent with right ventricular pacing. - Aortic valve: There was mild regurgitation. - Mitral valve: There is a calcified mobile density in the LV cavity most consistent with calcified redundant chordae tendinae. This was present on echo in 2014. There was mild  regurgitation. - Pulmonic valve: There was trivial regurgitation.  Assessment / Plan: 1. Volume overload - this has now resolved with cessation of her Norvasc.   2. NICM - with normalization of her EF - 50 to 55% by echo from September of 2015 - would continue to hold on repeating her echo for now. She is doing very well from our standpoint. Reminded her about restricting her salt (going to [employer] after visit here).   3. Underlying CRT-D - followed by Dr. Caryl Comes  4. HTN -  BP ok at this time. No change with her current regimen.   5. Advanced age - overall, she does remarkably well.   Current medicines are reviewed with the patient today.  The patient does not have concerns regarding medicines other than what has been noted above.  The following changes have been made:  See above.  Labs/ tests ordered today include:   No orders of the defined types were placed in this encounter.    Disposition:   FU with me in 4 months.   Patient is agreeable to this plan and will call if any problems develop in the interim.   SignedTruitt Merle, NP  02/08/2017 1:50 PM  [facility] [address] [place], [place]  [address] Phone: [phone] Fax: [phone]

## 2017-02-08 ENCOUNTER — Other Ambulatory Visit: Payer: Self-pay | Admitting: *Deleted

## 2017-02-08 ENCOUNTER — Encounter (INDEPENDENT_AMBULATORY_CARE_PROVIDER_SITE_OTHER): Payer: Self-pay

## 2017-02-08 ENCOUNTER — Ambulatory Visit (INDEPENDENT_AMBULATORY_CARE_PROVIDER_SITE_OTHER): Payer: Medicare Other | Admitting: Nurse Practitioner

## 2017-02-08 ENCOUNTER — Encounter: Payer: Self-pay | Admitting: Nurse Practitioner

## 2017-02-08 VITALS — BP 142/80 | HR 82 | Ht 66.5 in | Wt 129.4 lb

## 2017-02-08 DIAGNOSIS — I428 Other cardiomyopathies: Secondary | ICD-10-CM

## 2017-02-08 DIAGNOSIS — I5022 Chronic systolic (congestive) heart failure: Secondary | ICD-10-CM | POA: Diagnosis not present

## 2017-02-08 DIAGNOSIS — Z95810 Presence of automatic (implantable) cardiac defibrillator: Secondary | ICD-10-CM

## 2017-02-08 DIAGNOSIS — I10 Essential (primary) hypertension: Secondary | ICD-10-CM | POA: Diagnosis not present

## 2017-02-08 MED ORDER — FUROSEMIDE 20 MG PO TABS: 20.0000 mg | ORAL_TABLET | Freq: Every day | ORAL | 11 refills | Status: DC

## 2017-02-08 MED ORDER — LOSARTAN POTASSIUM 100 MG PO TABS: 100.0000 mg | ORAL_TABLET | Freq: Every day | ORAL | 3 refills | Status: DC

## 2017-02-08 NOTE — Patient Instructions (Addendum)
We will be checking the following labs today - NONE   Medication Instructions:    Continue with your current medicines. BUT  Call me with the actual dosage of your Lasix (Furosemide)    Testing/Procedures To Be Arranged:  N/A  Follow-Up:   See me in 4 months    Other Special Instructions:   Watch the salt for me.   Have a good time at the beach.     If you need a refill on your cardiac medications before your next appointment, please call your pharmacy.   Call the [place] office at [phone] if you have any questions, problems or concerns.

## 2017-02-09 DIAGNOSIS — E1151 Type 2 diabetes mellitus with diabetic peripheral angiopathy without gangrene: Secondary | ICD-10-CM | POA: Diagnosis not present

## 2017-02-09 DIAGNOSIS — I739 Peripheral vascular disease, unspecified: Secondary | ICD-10-CM | POA: Diagnosis not present

## 2017-02-09 DIAGNOSIS — L603 Nail dystrophy: Secondary | ICD-10-CM | POA: Diagnosis not present

## 2017-02-14 DIAGNOSIS — D044 Carcinoma in situ of skin of scalp and neck: Secondary | ICD-10-CM | POA: Diagnosis not present

## 2017-02-14 DIAGNOSIS — D485 Neoplasm of uncertain behavior of skin: Secondary | ICD-10-CM | POA: Diagnosis not present

## 2017-02-14 DIAGNOSIS — Z85828 Personal history of other malignant neoplasm of skin: Secondary | ICD-10-CM | POA: Diagnosis not present

## 2017-02-14 DIAGNOSIS — L218 Other seborrheic dermatitis: Secondary | ICD-10-CM | POA: Diagnosis not present

## 2017-02-14 DIAGNOSIS — L570 Actinic keratosis: Secondary | ICD-10-CM | POA: Diagnosis not present

## 2017-02-17 ENCOUNTER — Ambulatory Visit (INDEPENDENT_AMBULATORY_CARE_PROVIDER_SITE_OTHER): Payer: Medicare Other | Admitting: *Deleted

## 2017-02-17 DIAGNOSIS — I428 Other cardiomyopathies: Secondary | ICD-10-CM

## 2017-02-17 DIAGNOSIS — Z95810 Presence of automatic (implantable) cardiac defibrillator: Secondary | ICD-10-CM

## 2017-02-17 DIAGNOSIS — I5022 Chronic systolic (congestive) heart failure: Secondary | ICD-10-CM

## 2017-02-17 NOTE — Progress Notes (Signed)
Remote ICD transmission.   

## 2017-02-18 ENCOUNTER — Encounter: Payer: Self-pay | Admitting: Cardiology

## 2017-02-18 LAB — CUP PACEART REMOTE DEVICE CHECK
Brady Statistic AP VP Percent: 27.16 %
Brady Statistic AP VS Percent: 0.36 %
Brady Statistic AS VP Percent: 71.45 %
Brady Statistic AS VS Percent: 1.03 %
Brady Statistic RV Percent Paced: 0.49 %
HIGH POWER IMPEDANCE MEASURED VALUE: 49 Ohm
HIGH POWER IMPEDANCE MEASURED VALUE: 67 Ohm
Implantable Lead Implant Date: 20031007
Implantable Lead Implant Date: 20031007
Implantable Lead Location: 753860
Implantable Lead Model: 4193
Implantable Lead Model: 4469
Implantable Lead Serial Number: 337988
Implantable Pulse Generator Implant Date: 20141022
Lead Channel Impedance Value: 304 Ohm
Lead Channel Impedance Value: 4047 Ohm
Lead Channel Impedance Value: 4047 Ohm
Lead Channel Impedance Value: 456 Ohm
Lead Channel Pacing Threshold Amplitude: 0.75 V
Lead Channel Pacing Threshold Amplitude: 0.75 V
Lead Channel Pacing Threshold Pulse Width: 0.4 ms
Lead Channel Pacing Threshold Pulse Width: 0.4 ms
Lead Channel Sensing Intrinsic Amplitude: 28.375 mV
Lead Channel Sensing Intrinsic Amplitude: 28.375 mV
Lead Channel Setting Pacing Amplitude: 1.75 V
Lead Channel Setting Pacing Amplitude: 2 V
Lead Channel Setting Pacing Amplitude: 2.5 V
Lead Channel Setting Pacing Pulse Width: 0.4 ms
Lead Channel Setting Sensing Sensitivity: 0.6 mV
MDC IDC LEAD IMPLANT DT: 20031007
MDC IDC LEAD IMPLANT DT: 20060227
MDC IDC LEAD LOCATION: 753858
MDC IDC LEAD LOCATION: 753859
MDC IDC LEAD LOCATION: 753860
MDC IDC LEAD SERIAL: 361236
MDC IDC MSMT BATTERY REMAINING LONGEVITY: 68 mo
MDC IDC MSMT BATTERY VOLTAGE: 2.99 V
MDC IDC MSMT LEADCHNL RA IMPEDANCE VALUE: 456 Ohm
MDC IDC MSMT LEADCHNL RA SENSING INTR AMPL: 1.375 mV
MDC IDC MSMT LEADCHNL RA SENSING INTR AMPL: 1.375 mV
MDC IDC MSMT LEADCHNL RV IMPEDANCE VALUE: 418 Ohm
MDC IDC MSMT LEADCHNL RV PACING THRESHOLD AMPLITUDE: 1 V
MDC IDC MSMT LEADCHNL RV PACING THRESHOLD PULSEWIDTH: 0.4 ms
MDC IDC SESS DTM: 20180419041703
MDC IDC SET LEADCHNL RV PACING PULSEWIDTH: 0.4 ms
MDC IDC STAT BRADY RA PERCENT PACED: 27.48 %

## 2017-02-18 NOTE — Progress Notes (Signed)
EPIC Encounter for ICM Monitoring  Patient Name: Grace Robinson is a 83 y.o. female Date: 02/18/2017 Primary Care Physican: Precious Reel, MD Primary Cardiologist: Truitt Merle, NP Electrophysiologist: Faustino Congress Weight:127  lbs Bi-V Pacing: 98.4%       Heart Failure questions reviewed, pt asymptomatic    Thoracic impedance abnormal suggesting fluid accumulation since 02/08/2017.  Current prescribed dose of Furosemide 1 tablet (20 mg total) by mouth daily. alternating (40 mg) every other day.  Potassium 10 mEq 3 tablets daily  Labs: 07/30/2017 Creatinine 0.81, BUN 17, Potassium 3.9, Sodium 142  Recommendations: Advised to take 2 tablets (40 mg) daily x 3 days and then return to 1 tablet (20 mg total) by mouth daily. alternating (40 mg) every other day.    Follow-up plan: ICM clinic phone appointment on 02/24/2017 to recheck fluid levels.    Copy of ICM check sent to primary cardiologist and device physician.   3 month ICM trend: 02/17/2017   1 Year ICM trend:      [name], RN 02/18/2017 12:55 PM

## 2017-02-24 ENCOUNTER — Telehealth: Payer: Self-pay | Admitting: Cardiology

## 2017-02-24 ENCOUNTER — Ambulatory Visit (INDEPENDENT_AMBULATORY_CARE_PROVIDER_SITE_OTHER): Payer: Self-pay

## 2017-02-24 DIAGNOSIS — I5022 Chronic systolic (congestive) heart failure: Secondary | ICD-10-CM

## 2017-02-24 DIAGNOSIS — Z95810 Presence of automatic (implantable) cardiac defibrillator: Secondary | ICD-10-CM

## 2017-02-24 NOTE — Progress Notes (Signed)
EPIC Encounter for ICM Monitoring  Patient Name: Grace Robinson is a 83 y.o. female Date: 02/24/2017 Primary Care Physican: Precious Reel, MD Primary Cardiologist: Truitt Merle, NP Electrophysiologist: Faustino Congress Weight:127  lbs Bi-V Pacing: 98.2%           Heart Failure questions reviewed, pt asymptomatic.   Thoracic impedance returned to normal after taking 3 days of increased Furosemide.  Current prescribed dose of Furosemide 1 tablet (20 mg total) by mouth daily. alternating (40 mg) every other day. Potassium 10 mEq 3 tablets daily  Labs: 07/30/2017 Creatinine 0.81, BUN 17, Potassium 3.9, Sodium 142  Recommendations: No changes.  Encouraged to call for fluid symptoms.  Follow-up plan: ICM clinic phone appointment on 03/24/2017.    Copy of ICM check sent to primary cardiologist and device physician.   3 month ICM trend: 02/24/2017   1 Year ICM trend:      [name], RN 02/24/2017 4:29 PM

## 2017-02-24 NOTE — Telephone Encounter (Signed)
LMOVM reminding pt to send remote transmission.   

## 2017-03-14 DIAGNOSIS — Z961 Presence of intraocular lens: Secondary | ICD-10-CM | POA: Diagnosis not present

## 2017-03-14 DIAGNOSIS — Z794 Long term (current) use of insulin: Secondary | ICD-10-CM | POA: Diagnosis not present

## 2017-03-14 DIAGNOSIS — E119 Type 2 diabetes mellitus without complications: Secondary | ICD-10-CM | POA: Diagnosis not present

## 2017-03-18 DIAGNOSIS — E1122 Type 2 diabetes mellitus with diabetic chronic kidney disease: Secondary | ICD-10-CM | POA: Diagnosis not present

## 2017-03-18 DIAGNOSIS — I130 Hypertensive heart and chronic kidney disease with heart failure and stage 1 through stage 4 chronic kidney disease, or unspecified chronic kidney disease: Secondary | ICD-10-CM | POA: Diagnosis not present

## 2017-03-18 DIAGNOSIS — Z794 Long term (current) use of insulin: Secondary | ICD-10-CM | POA: Diagnosis not present

## 2017-03-18 DIAGNOSIS — M25561 Pain in right knee: Secondary | ICD-10-CM | POA: Diagnosis not present

## 2017-03-24 ENCOUNTER — Ambulatory Visit (INDEPENDENT_AMBULATORY_CARE_PROVIDER_SITE_OTHER): Payer: Medicare Other

## 2017-03-24 DIAGNOSIS — Z95810 Presence of automatic (implantable) cardiac defibrillator: Secondary | ICD-10-CM | POA: Diagnosis not present

## 2017-03-24 DIAGNOSIS — I5022 Chronic systolic (congestive) heart failure: Secondary | ICD-10-CM

## 2017-03-24 NOTE — Progress Notes (Signed)
EPIC Encounter for ICM Monitoring  Patient Name: LAYTON TAPPAN is a 83 y.o. female Date: 03/24/2017 Primary Care Physican: Shon Baton, MD Primary Cardiologist: Truitt Merle, NP Electrophysiologist: Faustino Congress Weight:126 lbs Bi-V Pacing: 98.4%       Heart Failure questions reviewed, pt asymptomatic    Thoracic impedance normal.  Current prescribed dose of Furosemide 1 tablet (20 mg total) by mouth daily. alternating (40 mg) every other day.Potassium 10 mEq 3 tablets daily  Labs: 07/30/2017 Creatinine 0.81, BUN 17, Potassium 3.9, Sodium 142  Recommendations:  No changes. Discussed to limit salt intake to 2000 mg/day and fluid intake to < 2 liters/day.  Encouraged to call for fluid symptoms or use local ER for any urgent symptoms.  Follow-up plan: ICM clinic phone appointment on 04/26/2017.    Copy of ICM check sent to device physician.   3 month ICM trend: 03/24/2017   1 Year ICM trend:      [name], RN 03/24/2017 2:10 PM

## 2017-04-11 DIAGNOSIS — Z85828 Personal history of other malignant neoplasm of skin: Secondary | ICD-10-CM | POA: Diagnosis not present

## 2017-04-11 DIAGNOSIS — D485 Neoplasm of uncertain behavior of skin: Secondary | ICD-10-CM | POA: Diagnosis not present

## 2017-04-11 DIAGNOSIS — C44729 Squamous cell carcinoma of skin of left lower limb, including hip: Secondary | ICD-10-CM | POA: Diagnosis not present

## 2017-04-13 ENCOUNTER — Other Ambulatory Visit: Payer: Self-pay | Admitting: Physician Assistant

## 2017-04-14 ENCOUNTER — Telehealth: Payer: Self-pay | Admitting: *Deleted

## 2017-04-14 NOTE — Telephone Encounter (Signed)
Per Nicoletta Ba PA, OK to send prescription for Bentyl 10 mg . [name] last saw the patient on 10-28-2017.

## 2017-04-26 ENCOUNTER — Ambulatory Visit (INDEPENDENT_AMBULATORY_CARE_PROVIDER_SITE_OTHER): Payer: Medicare Other

## 2017-04-26 ENCOUNTER — Telehealth: Payer: Self-pay | Admitting: Cardiology

## 2017-04-26 DIAGNOSIS — I5022 Chronic systolic (congestive) heart failure: Secondary | ICD-10-CM | POA: Diagnosis not present

## 2017-04-26 DIAGNOSIS — Z95810 Presence of automatic (implantable) cardiac defibrillator: Secondary | ICD-10-CM | POA: Diagnosis not present

## 2017-04-26 NOTE — Progress Notes (Signed)
EPIC Encounter for ICM Monitoring  Patient Name: Grace Robinson is a 83 y.o. female Date: 04/26/2017 Primary Care Physican: Shon Baton, MD Primary Cardiologist: Truitt Merle, NP Electrophysiologist: Caryl Comes Dry Weight:126 lbs Bi-V Pacing: 98.4%      Heart Failure questions reviewed, pt asymptomatic.   Thoracic impedance normal  Prescribed dosage: Furosemide 1 tablet (20 mg total) by mouth daily. alternating (40 mg) every other day.Potassium 10 mEq 3 tablets daily  Labs: 07/30/2016 Creatinine 0.81, BUN 17, Potassium 3.9, Sodium 142  Recommendations: No changes.  Advised to limit salt intake to 2000 mg/day and fluid intake to < 2 liters/day.  Encouraged to call for fluid symptoms.  Follow-up plan: ICM clinic phone appointment on 05/27/2017.  Office appointment scheduled 06/15/2017 with Truitt Merle, NP.  Copy of ICM check sent to device physician.   3 month ICM trend: 04/26/2017   1 Year ICM trend:      [name], RN 04/26/2017 3:50 PM

## 2017-04-26 NOTE — Telephone Encounter (Signed)
Spoke with pt and reminded pt of remote transmission that is due today. Pt verbalized understanding.   

## 2017-05-02 DIAGNOSIS — Z85828 Personal history of other malignant neoplasm of skin: Secondary | ICD-10-CM | POA: Diagnosis not present

## 2017-05-02 DIAGNOSIS — C44729 Squamous cell carcinoma of skin of left lower limb, including hip: Secondary | ICD-10-CM | POA: Diagnosis not present

## 2017-05-18 DIAGNOSIS — I739 Peripheral vascular disease, unspecified: Secondary | ICD-10-CM | POA: Diagnosis not present

## 2017-05-18 DIAGNOSIS — L84 Corns and callosities: Secondary | ICD-10-CM | POA: Diagnosis not present

## 2017-05-18 DIAGNOSIS — E1151 Type 2 diabetes mellitus with diabetic peripheral angiopathy without gangrene: Secondary | ICD-10-CM | POA: Diagnosis not present

## 2017-05-18 DIAGNOSIS — L603 Nail dystrophy: Secondary | ICD-10-CM | POA: Diagnosis not present

## 2017-05-30 ENCOUNTER — Ambulatory Visit (INDEPENDENT_AMBULATORY_CARE_PROVIDER_SITE_OTHER): Payer: Medicare Other | Admitting: *Deleted

## 2017-05-30 DIAGNOSIS — Z95810 Presence of automatic (implantable) cardiac defibrillator: Secondary | ICD-10-CM | POA: Diagnosis not present

## 2017-05-30 DIAGNOSIS — I428 Other cardiomyopathies: Secondary | ICD-10-CM

## 2017-05-30 DIAGNOSIS — I5022 Chronic systolic (congestive) heart failure: Secondary | ICD-10-CM | POA: Diagnosis not present

## 2017-05-30 NOTE — Progress Notes (Signed)
Remote ICD transmission.   

## 2017-05-31 NOTE — Progress Notes (Signed)
EPIC Encounter for ICM Monitoring  Patient Name: Grace Robinson is a 83 y.o. female Date: 05/31/2017 Primary Care Physican: Shon Baton, MD Primary Cardiologist: Truitt Merle, NP Electrophysiologist: Faustino Congress Weight:Last office visit 126lbs Bi-V Pacing: 98.3%      Heart Failure questions reviewed, pt asymptomatic.   Thoracic impedance normal.  Prescribed dosage: Furosemide 1 tablet (20 mg total) by mouth daily. alternating (40 mg) every other day.Potassium 10 mEq 3 tablets daily  Labs: 07/30/2016 Creatinine 0.81, BUN 17, Potassium 3.9, Sodium 142  Recommendations: No changes.   Encouraged to call for fluid symptoms.  Follow-up plan: ICM clinic phone appointment on 07/01/2017.  Office appointment scheduled 06/15/2017 with Truitt Merle, NP.  Copy of ICM check sent to device physician.   3 month ICM trend: 05/31/2017   1 Year ICM trend:      [name], RN 05/31/2017 2:39 PM

## 2017-06-02 LAB — CUP PACEART REMOTE DEVICE CHECK
Battery Remaining Longevity: 63 mo
Battery Voltage: 2.98 V
Brady Statistic AP VS Percent: 0.31 %
Brady Statistic AS VP Percent: 78.42 %
Date Time Interrogation Session: 20180730073425
HIGH POWER IMPEDANCE MEASURED VALUE: 49 Ohm
HIGH POWER IMPEDANCE MEASURED VALUE: 65 Ohm
Implantable Lead Implant Date: 20031007
Implantable Lead Implant Date: 20031007
Implantable Lead Implant Date: 20060227
Implantable Lead Location: 753858
Implantable Lead Location: 753860
Implantable Lead Model: 4193
Implantable Lead Model: 4470
Implantable Pulse Generator Implant Date: 20141022
Lead Channel Impedance Value: 304 Ohm
Lead Channel Impedance Value: 456 Ohm
Lead Channel Impedance Value: 456 Ohm
Lead Channel Pacing Threshold Amplitude: 0.75 V
Lead Channel Pacing Threshold Amplitude: 0.875 V
Lead Channel Pacing Threshold Pulse Width: 0.4 ms
Lead Channel Pacing Threshold Pulse Width: 0.4 ms
Lead Channel Sensing Intrinsic Amplitude: 1.375 mV
Lead Channel Sensing Intrinsic Amplitude: 26.5 mV
Lead Channel Sensing Intrinsic Amplitude: 26.5 mV
Lead Channel Setting Pacing Amplitude: 1.75 V
Lead Channel Setting Pacing Amplitude: 2.5 V
Lead Channel Setting Pacing Pulse Width: 0.4 ms
MDC IDC LEAD IMPLANT DT: 20031007
MDC IDC LEAD LOCATION: 753859
MDC IDC LEAD LOCATION: 753860
MDC IDC LEAD SERIAL: 337988
MDC IDC LEAD SERIAL: 361236
MDC IDC MSMT LEADCHNL LV IMPEDANCE VALUE: 4047 Ohm
MDC IDC MSMT LEADCHNL LV IMPEDANCE VALUE: 4047 Ohm
MDC IDC MSMT LEADCHNL RA PACING THRESHOLD AMPLITUDE: 0.75 V
MDC IDC MSMT LEADCHNL RA PACING THRESHOLD PULSEWIDTH: 0.4 ms
MDC IDC MSMT LEADCHNL RA SENSING INTR AMPL: 1.375 mV
MDC IDC MSMT LEADCHNL RV IMPEDANCE VALUE: 418 Ohm
MDC IDC SET LEADCHNL RA PACING AMPLITUDE: 2 V
MDC IDC SET LEADCHNL RV PACING PULSEWIDTH: 0.4 ms
MDC IDC SET LEADCHNL RV SENSING SENSITIVITY: 0.6 mV
MDC IDC STAT BRADY AP VP PERCENT: 20.17 %
MDC IDC STAT BRADY AS VS PERCENT: 1.1 %
MDC IDC STAT BRADY RA PERCENT PACED: 20.45 %
MDC IDC STAT BRADY RV PERCENT PACED: 0.69 %

## 2017-06-03 ENCOUNTER — Encounter: Payer: Self-pay | Admitting: Cardiology

## 2017-06-14 ENCOUNTER — Telehealth: Payer: Self-pay | Admitting: Physician Assistant

## 2017-06-14 NOTE — Telephone Encounter (Signed)
Called the pharmacy and advised I made an error on the Bentyl script. The patient is to take 1 tab 30 min before meals, (TID).  #90 with 3 refills. Patient informed she can pick the script up at [facility], [address]

## 2017-06-15 ENCOUNTER — Ambulatory Visit (INDEPENDENT_AMBULATORY_CARE_PROVIDER_SITE_OTHER): Payer: Medicare Other | Admitting: Nurse Practitioner

## 2017-06-15 ENCOUNTER — Encounter: Payer: Self-pay | Admitting: Nurse Practitioner

## 2017-06-15 VITALS — BP 118/60 | HR 70 | Ht 66.5 in | Wt 132.2 lb

## 2017-06-15 DIAGNOSIS — I10 Essential (primary) hypertension: Secondary | ICD-10-CM | POA: Diagnosis not present

## 2017-06-15 DIAGNOSIS — I5022 Chronic systolic (congestive) heart failure: Secondary | ICD-10-CM

## 2017-06-15 DIAGNOSIS — I428 Other cardiomyopathies: Secondary | ICD-10-CM

## 2017-06-15 NOTE — Progress Notes (Signed)
CARDIOLOGY OFFICE NOTE  Date:  06/15/2017    Grace Robinson Date of Birth: Sep 17, 1926 Medical Record #[id number]  PCP:  Shon Baton, MD  Cardiologist:  [facility]   Chief Complaint  Patient presents with  . Cardiomyopathy    Follow up visit - seen for Dr. Caryl Comes    History of Present Illness: Grace Robinson is a 83 y.o. female who presents today for a follow up visit.  Seen for Dr. Percival Spanish and Dr. Caryl Comes. She is a former patient of Dr. Susa Simmonds. Primarily follows with me.   She has an nonischemic CM with remote EF down to 25% that has improved with aggressive medical therapy and CRT-D. Last generator change in 2014. Other issues include HLD, IDDM, ulcerative colitis, LBBB, Meniere's disease and advanced age. Last echo back in 07/2014 showed her EF had normalized at 50 to 55%.   I have seen her back several times. She has basically done pretty well from our standpoint. Back in September- having more swelling - was on more Norvasc. I changed her medicines around. BNP not all that impressive. Swelling resolved. Doing much better on return visit back in October. BP was up at her last visit but did trend back down with monitoring at home. I last saw her back in December. She had been to GI for chronic diarrhea and was happy with her treatment from [facility]. Saw Dr. Caryl Comes for her device check in January. I last saw her back in April and she was doing well. Still traveling. No real concerns.   Comes back today. Here alone. She is doing well. She has had lots of company for the past 10 days - she has been cooking and hosting. She is quite tired but other than that - she feels good. But she is doing well. Breathing is good. No chest pain. Had a skin cancer removed from her left leg - it is healing - not infected.  BP good at home. She remains very happy that her diarrhea is resolved. She has had some muscle spasms of the neck - this resolved with some left over medicine  from when she had it back in October. Seeing PCP soon with labs. Other labs from this year noted. Really has no concerns.   Past Medical History:  Diagnosis Date  . Biventricular ICD (implantable cardiac defibrillator) in place   . Cellulitis   . Change in bowel habits   . CHF (congestive heart failure) ([place])   . Colon polyps   . Diabetes mellitus    Type II  . Diarrhea   . Dyslipidemia   . HOH (hard of hearing)    left  . HTN (hypertension)   . Ischemic cardiomyopathy   . LBBB (left bundle branch block)    chronic  . Meniere's disease   . T wave oversensing 01/17/2014  . Ulcerative colitis   . Varicose veins    4 lazer  treatment each leg    Past Surgical History:  Procedure Laterality Date  . ABDOMINAL HYSTERECTOMY    . ABDOMINAL HYSTERECTOMY    . CARDIAC CATHETERIZATION  08/2011  . CHOLECYSTECTOMY    . COLONOSCOPY    . EP Study  08/22/08  . IMPLANTABLE CARDIOVERTER DEFIBRILLATOR GENERATOR CHANGE N/A 08/22/2013   Procedure: IMPLANTABLE CARDIOVERTER DEFIBRILLATOR GENERATOR CHANGE;  Surgeon: Deboraha Sprang, MD;  Location: [facility] CATH LAB;  Service: Cardiovascular;  Laterality: N/A;  . KNEE ARTHROSCOPY Left 04/14/2016   Procedure: LEFT  KNEE ARTHROSCOPY, PARTIAL MEDIAL AND PARTIAL LATERAL MENISCECTOMY, MEDIAL PLICA;  Surgeon: Dorna Leitz, MD;  Location: [facility];  Service: Orthopedics;  Laterality: Left;  . mastoid sugery     in the setting of Meniere's disease  . PACEMAKER INSERTION     Medtronic, remote- no.   . pericardial effusion  12/2002  . TONSILLECTOMY AND ADENOIDECTOMY       Medications: Current Meds  Medication Sig  . acetaminophen (TYLENOL) 650 MG CR tablet Take 650 mg by mouth as needed for pain.  Marland Kitchen aspirin EC 81 MG tablet Take 1 tablet (81 mg total) by mouth daily.  Marland Kitchen atorvastatin (LIPITOR) 40 MG tablet Take 40 mg by mouth daily.  . cholestyramine (QUESTRAN) 4 g packet Take 1 packet (4 g total) by mouth daily. Do not take within 2 hours of other medication  .  dicyclomine (BENTYL) 10 MG capsule Take 1 tab 30 min before meals  . furosemide (LASIX) 20 MG tablet Take 1 tablet (20 mg total) by mouth daily. alternating (40 mg) every other day.  Marland Kitchen HUMALOG MIX 75/25 (75-25) 100 UNIT/ML SUSP injection Inject 20 Units into the skin 2 (two) times daily with a meal. Over 200 take (45 units) break up in three part to take 3 times daily  . losartan (COZAAR) 100 MG tablet Take 1 tablet (100 mg total) by mouth daily.  . metoprolol succinate (TOPROL-XL) 100 MG 24 hr tablet Take 1 tablet by mouth 2 (two) times daily.  . potassium chloride (K-DUR) 10 MEQ tablet Take 10 mEq by mouth. 3 tablets daily (30 mEq total)  . sitaGLIPtin (JANUVIA) 100 MG tablet Take 100 mg by mouth daily.  . [DISCONTINUED] dicyclomine (BENTYL) 10 MG capsule TAKE ONE CAPSULE BY MOUTH ONCE DAILY 30  MINUTES  BEFORE  MEALS  . [DISCONTINUED] saccharomyces boulardii (FLORASTOR) 250 MG capsule Take 1 tab once daily.     Allergies: Allergies  Allergen Reactions  . Carvedilol Other (See Comments)  . Vancomycin Other (See Comments)  . Sudafed [Pseudoephedrine] Other (See Comments)    Makes skin feel like its crawling    Social History: The patient  reports that she has quit smoking. She quit after 1.00 year of use. She has never used smokeless tobacco. She reports that she does not drink alcohol or use drugs.   Family History: The patient's family history includes Breast cancer in her mother; Emphysema (age of onset: 38) in her father; Heart disease in her father; Lung cancer in her mother.   Review of Systems: Please see the history of present illness.   Otherwise, the review of systems is positive for none.   All other systems are reviewed and negative.   Physical Exam: VS:  BP 118/60 (BP Location: Left Arm, Patient Position: Sitting, Cuff Size: Normal)   Pulse 70   Ht 5' 6.5" (1.689 m)   Wt 132 lb 3.2 oz (60 kg)   SpO2 99%   BMI 21.02 kg/m  .  BMI Body mass index is 21.02 kg/m.  Wt  Readings from Last 3 Encounters:  06/15/17 132 lb 3.2 oz (60 kg)  02/08/17 129 lb 6.4 oz (58.7 kg)  11/16/16 131 lb 12.8 oz (59.8 kg)    General: Pleasant. She looks much younger than her stated age. She is alert and in no acute distress.   HEENT: Normal.  Neck: Supple, no JVD, carotid bruits, or masses noted.  Cardiac: Regular rate and rhythm. No murmurs, rubs, or gallops. No edema.  Respiratory:  Lungs are clear to auscultation bilaterally with normal work of breathing.  GI: Soft and nontender.  MS: No deformity or atrophy. Gait and ROM intact.  Skin: Warm and dry. Color is normal.  Neuro:  Strength and sensation are intact and no gross focal deficits noted.  Psych: Alert, appropriate and with normal affect.   LABORATORY DATA:  EKG:  EKG is not ordered today.  Lab Results  Component Value Date   WBC 5.8 09/06/2016   HGB 13.6 09/06/2016   HCT 39.7 09/06/2016   PLT 220.0 09/06/2016   GLUCOSE 237 (H) 07/30/2016   ALT 19 07/30/2016   AST 22 07/30/2016   NA 142 07/30/2016   K 3.9 07/30/2016   CL 103 07/30/2016   CREATININE 0.81 07/30/2016   BUN 17 07/30/2016   CO2 27 07/30/2016   TSH 1.23 07/20/2011   INR 1.1 (H) 08/13/2013       BNP (last 3 results)  Recent Labs  07/30/16 0841  BNP 119.8*    ProBNP (last 3 results) No results for input(s): PROBNP in the last 8760 hours.   Other Studies Reviewed Today:  Echo Study Conclusions from 07/2014  - Left ventricle: The cavity size was normal. Systolic function was normal. The estimated ejection fraction was in the range of 50% to 55%. There was an increased relative contribution of atrial contraction to ventricular filling. Doppler parameters are consistent with abnormal left ventricular relaxation (grade 1 diastolic dysfunction). - Ventricular septum: Septal motion showed paradox. These changes are consistent with right ventricular pacing. - Aortic valve: There was mild regurgitation. - Mitral  valve: There is a calcified mobile density in the LV cavity most consistent with calcified redundant chordae tendinae. This was present on echo in 2014. There was mild regurgitation. - Pulmonic valve: There was trivial regurgitation.  Assessment / Plan: 1. Volume overload - this has now resolved with cessation of her Norvasc.   2. NICM - with normalization of her EF - 50 to 55% by echo from September of 2015 - would continue tohold on repeating her echo for now. She is doing very well from our standpoint. Reminded her about restricting her salt (going to [employer] after visit here).   3. Underlying CRT-D - followed by Dr. Caryl Comes - to follow up with EP in January of 2019  4. HTN - BP looks great. No change with her current regimen.   5. Advanced age - overall, she does remarkably well.   6. Diarrhea - basically resolved.  7. Neck spasms   Current medicines are reviewed with the patient today.  The patient does not have concerns regarding medicines other than what has been noted above.  The following changes have been made:  See above.  Labs/ tests ordered today include:   No orders of the defined types were placed in this encounter.    Disposition:   FU with EP in 4 months - I will see back next spring unless she has issues in the interim.   Patient is agreeable to this plan and will call if any problems develop in the interim.   SignedTruitt Merle, NP  06/15/2017 2:07 PM  [facility] [address] [place], [place]  [address] Phone: [phone] Fax: [phone]

## 2017-06-15 NOTE — Patient Instructions (Addendum)
We will be checking the following labs today - NONE   Medication Instructions:    Continue with your current medicines.     Testing/Procedures To Be Arranged:  N/A  Follow-Up:   See me next April or May    Other Special Instructions:   N/A    If you need a refill on your cardiac medications before your next appointment, please call your pharmacy.   Call the [place] office at [phone] if you have any questions, problems or concerns.

## 2017-07-01 ENCOUNTER — Ambulatory Visit (INDEPENDENT_AMBULATORY_CARE_PROVIDER_SITE_OTHER): Payer: Medicare Other

## 2017-07-01 DIAGNOSIS — Z95810 Presence of automatic (implantable) cardiac defibrillator: Secondary | ICD-10-CM | POA: Diagnosis not present

## 2017-07-01 DIAGNOSIS — I5022 Chronic systolic (congestive) heart failure: Secondary | ICD-10-CM

## 2017-07-01 NOTE — Progress Notes (Signed)
EPIC Encounter for ICM Monitoring  Patient Name: Grace Robinson is a 83 y.o. female Date: 07/01/2017 Primary Care Physican: Shon Baton, MD Primary Cardiologist: Truitt Merle, NP Electrophysiologist: Faustino Congress Weight:Last office visit 132lbs Bi-V Pacing: 98.3%     Clinical Status Since 30-May-2017 VT-NS (>4 beats, >130 bpm) 3       Heart Failure questions reviewed, pt asymptomatic.   Thoracic impedance trending just below baseline normal.  Prescribed dosage: Furosemide 1 tablet (20 mg total) by mouth daily. alternating (40 mg) every other day.Potassium 10 mEq 3 tablets daily  Labs: 09/29/2017Creatinine 0.81, BUN 17, Potassium 3.9, Sodium 142  Recommendations: No changes.    Encouraged to call for fluid symptoms.  Follow-up plan: ICM clinic phone appointment on 08/08/2017.    Copy of ICM check sent to Dr. Caryl Comes.   3 month ICM trend: 07/01/2017   1 Year ICM trend:            [name], RN 07/01/2017 11:51 AM

## 2017-07-13 DIAGNOSIS — Z85828 Personal history of other malignant neoplasm of skin: Secondary | ICD-10-CM | POA: Diagnosis not present

## 2017-07-13 DIAGNOSIS — C4442 Squamous cell carcinoma of skin of scalp and neck: Secondary | ICD-10-CM | POA: Diagnosis not present

## 2017-07-13 DIAGNOSIS — D485 Neoplasm of uncertain behavior of skin: Secondary | ICD-10-CM | POA: Diagnosis not present

## 2017-07-19 DIAGNOSIS — I130 Hypertensive heart and chronic kidney disease with heart failure and stage 1 through stage 4 chronic kidney disease, or unspecified chronic kidney disease: Secondary | ICD-10-CM | POA: Diagnosis not present

## 2017-07-19 DIAGNOSIS — C4492 Squamous cell carcinoma of skin, unspecified: Secondary | ICD-10-CM | POA: Diagnosis not present

## 2017-07-19 DIAGNOSIS — M436 Torticollis: Secondary | ICD-10-CM | POA: Diagnosis not present

## 2017-07-19 DIAGNOSIS — E1122 Type 2 diabetes mellitus with diabetic chronic kidney disease: Secondary | ICD-10-CM | POA: Diagnosis not present

## 2017-07-27 DIAGNOSIS — C4442 Squamous cell carcinoma of skin of scalp and neck: Secondary | ICD-10-CM | POA: Diagnosis not present

## 2017-07-27 DIAGNOSIS — Z85828 Personal history of other malignant neoplasm of skin: Secondary | ICD-10-CM | POA: Diagnosis not present

## 2017-08-01 ENCOUNTER — Encounter: Payer: Self-pay | Admitting: *Deleted

## 2017-08-01 ENCOUNTER — Telehealth: Payer: Self-pay | Admitting: Physician Assistant

## 2017-08-01 NOTE — Telephone Encounter (Signed)
Advised. She agrees to this plan.

## 2017-08-01 NOTE — Telephone Encounter (Signed)
Having frequent urgent water bowel movements past 3 days. More than 4 daily. Becoming very incontinent with this. She is taking her Bentyl. Asks if she can take Imodium. Do you want stool studies first?

## 2017-08-01 NOTE — Telephone Encounter (Signed)
She has chronic periodic diarrhea- make sure she has refills on bentyl - she can take the bentyl 10 mg po QID , and yes of to use Imodium up to 4 daily prn for diarrhea - if no better in next 48 hours then she should come in for GI path panel

## 2017-08-08 ENCOUNTER — Ambulatory Visit (INDEPENDENT_AMBULATORY_CARE_PROVIDER_SITE_OTHER): Payer: Medicare Other

## 2017-08-08 ENCOUNTER — Telehealth: Payer: Self-pay

## 2017-08-08 DIAGNOSIS — I5022 Chronic systolic (congestive) heart failure: Secondary | ICD-10-CM

## 2017-08-08 DIAGNOSIS — Z95810 Presence of automatic (implantable) cardiac defibrillator: Secondary | ICD-10-CM

## 2017-08-08 NOTE — Progress Notes (Signed)
EPIC Encounter for ICM Monitoring  Patient Name: Grace Robinson is a 83 y.o. female Date: 08/08/2017 Primary Care Physican: Shon Baton, MD Primary Cardiologist: Truitt Merle, NP Electrophysiologist: Faustino Congress Weight:Last office visit 132lbs Bi-V Pacing: 98.3%  Since 01-Jul-2017 VT-NS (>4 beats, >130 bpm) --- 5      Heart Failure questions reviewed, pt asymptomatic.   Thoracic impedance abnormal suggesting fluid accumulation for the last few days.  Prescribed dosage: Furosemide 1 tablet (20 mg total) by mouth daily alternating (40 mg) every other day.Potassium 10 mEq 3 tablets daily  Labs: 09/29/2017Creatinine 0.81, BUN 17, Potassium 3.9, Sodium 142  Recommendations:  She reported drinking a lot of fluids in the last 3 days and advised to keep fluids limited to 64 oz daily.    Follow-up plan: ICM clinic phone appointment on 08/16/2017 to recheck fluid levels.  Copy of ICM check sent to Dr. Caryl Comes.   3 month ICM trend: 08/08/2017   1 Year ICM trend:      [name], RN 08/08/2017 3:29 PM

## 2017-08-08 NOTE — Telephone Encounter (Signed)
Spoke with pt and reminded pt of remote transmission that is due today. Pt verbalized understanding.   

## 2017-08-15 DIAGNOSIS — L603 Nail dystrophy: Secondary | ICD-10-CM | POA: Diagnosis not present

## 2017-08-15 DIAGNOSIS — E1151 Type 2 diabetes mellitus with diabetic peripheral angiopathy without gangrene: Secondary | ICD-10-CM | POA: Diagnosis not present

## 2017-08-15 DIAGNOSIS — I739 Peripheral vascular disease, unspecified: Secondary | ICD-10-CM | POA: Diagnosis not present

## 2017-08-15 DIAGNOSIS — L84 Corns and callosities: Secondary | ICD-10-CM | POA: Diagnosis not present

## 2017-08-16 ENCOUNTER — Ambulatory Visit (INDEPENDENT_AMBULATORY_CARE_PROVIDER_SITE_OTHER): Payer: Self-pay

## 2017-08-16 DIAGNOSIS — I5022 Chronic systolic (congestive) heart failure: Secondary | ICD-10-CM

## 2017-08-16 DIAGNOSIS — Z95810 Presence of automatic (implantable) cardiac defibrillator: Secondary | ICD-10-CM

## 2017-08-16 NOTE — Progress Notes (Signed)
EPIC Encounter for ICM Monitoring  Patient Name: Grace Robinson is a 83 y.o. female Date: 08/16/2017 Primary Care Physican: Shon Baton, MD Primary Cardiologist: Truitt Merle, NP Electrophysiologist: Faustino Congress Weight:Last office visit 132lbs Bi-V Pacing: 98.2%      Heart Failure questions reviewed, pt asymptomatic.   Thoracic impedance normal.  Prescribed dosage: Furosemide 1 tablet (20 mg total) by mouth daily alternating (40 mg) every other day.Potassium 10 mEq 3 tablets daily  Labs: 09/29/2017Creatinine 0.81, BUN 17, Potassium 3.9, Sodium 142  Recommendations: No changes.    Encouraged to call for fluid symptoms.  Follow-up plan: ICM clinic phone appointment on 09/08/2017.    Copy of ICM check sent to Dr. Caryl Comes.   3 month ICM trend: 08/16/2017   1 Year ICM trend:      [name], RN 08/16/2017 11:34 AM

## 2017-08-30 ENCOUNTER — Encounter: Payer: Self-pay | Admitting: Neurology

## 2017-09-08 ENCOUNTER — Ambulatory Visit (INDEPENDENT_AMBULATORY_CARE_PROVIDER_SITE_OTHER): Payer: Medicare Other | Admitting: *Deleted

## 2017-09-08 DIAGNOSIS — Z95810 Presence of automatic (implantable) cardiac defibrillator: Secondary | ICD-10-CM

## 2017-09-08 DIAGNOSIS — I428 Other cardiomyopathies: Secondary | ICD-10-CM

## 2017-09-08 DIAGNOSIS — I5022 Chronic systolic (congestive) heart failure: Secondary | ICD-10-CM

## 2017-09-08 NOTE — Progress Notes (Signed)
Remote ICD transmission.   

## 2017-09-08 NOTE — Progress Notes (Signed)
EPIC Encounter for ICM Monitoring  Patient Name: Grace Robinson is a 83 y.o. female Date: 09/08/2017 Primary Care Physican: Shon Baton, MD Primary Cardiologist: Truitt Merle, NP Electrophysiologist: Caryl Comes Dry Weight:130lbs Bi-V Pacing: 98.0%       Heart Failure questions reviewed, pt asymptomatic.   Optivol: Thoracic impedance normal.  Prescribed dosage: Furosemide 1 tablet (20 mg total) by mouth daily alternating (40 mg) every other day.Potassium 10 mEq 3 tablets daily  Labs: 09/29/2017Creatinine 0.81, BUN 17, Potassium 3.9, Sodium 142  Recommendations: No changes.   Encouraged to call for fluid symptoms.  Follow-up plan: ICM clinic phone appointment on 10/11/2017.    Copy of ICM check sent to Dr. Caryl Comes.   3 month ICM trend: 09/08/2017    1 Year ICM trend:       [name], RN 09/08/2017 4:32 PM

## 2017-09-09 ENCOUNTER — Encounter: Payer: Self-pay | Admitting: Cardiology

## 2017-09-13 LAB — CUP PACEART REMOTE DEVICE CHECK
Battery Remaining Longevity: 56 mo
Brady Statistic AP VS Percent: 0.42 %
Brady Statistic AS VP Percent: 70.74 %
Brady Statistic AS VS Percent: 1.04 %
Brady Statistic RA Percent Paced: 28.11 %
Brady Statistic RV Percent Paced: 0.41 %
HighPow Impedance: 51 Ohm
HighPow Impedance: 66 Ohm
Implantable Lead Implant Date: 20031007
Implantable Lead Implant Date: 20060227
Implantable Lead Location: 753859
Implantable Lead Location: 753860
Implantable Lead Model: 4193
Implantable Lead Model: 4469
Implantable Lead Model: 4470
Implantable Lead Model: 6949
Implantable Lead Serial Number: 337988
Lead Channel Impedance Value: 342 Ohm
Lead Channel Impedance Value: 4047 Ohm
Lead Channel Impedance Value: 456 Ohm
Lead Channel Impedance Value: 475 Ohm
Lead Channel Pacing Threshold Amplitude: 0.875 V
Lead Channel Pacing Threshold Amplitude: 1.125 V
Lead Channel Pacing Threshold Pulse Width: 0.4 ms
Lead Channel Pacing Threshold Pulse Width: 0.4 ms
Lead Channel Sensing Intrinsic Amplitude: 1.25 mV
Lead Channel Sensing Intrinsic Amplitude: 28.5 mV
Lead Channel Setting Pacing Amplitude: 2 V
Lead Channel Setting Pacing Amplitude: 2 V
Lead Channel Setting Pacing Amplitude: 2.5 V
Lead Channel Setting Pacing Pulse Width: 0.4 ms
MDC IDC LEAD IMPLANT DT: 20031007
MDC IDC LEAD IMPLANT DT: 20031007
MDC IDC LEAD LOCATION: 753858
MDC IDC LEAD LOCATION: 753860
MDC IDC LEAD SERIAL: 361236
MDC IDC MSMT BATTERY VOLTAGE: 2.98 V
MDC IDC MSMT LEADCHNL LV IMPEDANCE VALUE: 4047 Ohm
MDC IDC MSMT LEADCHNL LV PACING THRESHOLD PULSEWIDTH: 0.4 ms
MDC IDC MSMT LEADCHNL RA IMPEDANCE VALUE: 456 Ohm
MDC IDC MSMT LEADCHNL RA PACING THRESHOLD AMPLITUDE: 0.875 V
MDC IDC MSMT LEADCHNL RA SENSING INTR AMPL: 1.25 mV
MDC IDC MSMT LEADCHNL RV SENSING INTR AMPL: 28.5 mV
MDC IDC PG IMPLANT DT: 20141022
MDC IDC SESS DTM: 20181108072823
MDC IDC SET LEADCHNL LV PACING PULSEWIDTH: 0.4 ms
MDC IDC SET LEADCHNL RV SENSING SENSITIVITY: 0.6 mV
MDC IDC STAT BRADY AP VP PERCENT: 27.8 %

## 2017-09-15 NOTE — Progress Notes (Signed)
Grace Robinson was seen today in the movement disorders clinic for neurologic consultation at the request of Shon Baton, MD.  The consultation is for the evaluation of tremor.  The records that were made available to me were reviewed.  Tremor: Yes.     How long has it been going on? 1 year - 1.5 years  At rest or with activation?  Mostly at rest, with hand half way between pronation and supination.  Only in the L hand.  It has gotten a little more frequent but not a lot.  Only may happen one time every 2 days  Fam hx of tremor?  No.  Affected by caffeine:  unknown (1 cup tea per day; some caffeineted soda)  Affected by alcohol:  Does not drink alcohol  Affected by stress:  Yes.    Spills soup if on spoon:  No., she is R hand dominant  Spills glass of liquid if full:  No.  Specific Symptoms:  Voice: more "gravely" Sleep: sleeps well  Vivid Dreams:  No.  Acting out dreams:  No. Wet Pillows: No. Postural symptoms:  Yes.  , relates some to menieres  Falls?  No. Bradykinesia symptoms: difficulty getting out of a chair Loss of smell:  No. Loss of taste:  No. Urinary Incontinence:  No. but has urinary frequency Difficulty Swallowing:  No. Handwriting, micrographia: No. Depression:  No. Memory changes:  No. Hallucinations:  No.  visual distortions: No. N/V:  No. but does have chronic diarrhea with IBS Lightheaded:  No.  Syncope: No. Diplopia:  No. Dyskinesia:  No.  Neuroimaging of the brain has not previously been performed in the recent years.  She had a CT brain in 2004.  It was normal   ALLERGIES:   Allergies  Allergen Reactions  . Carvedilol Other (See Comments)  . Vancomycin Other (See Comments)  . Sudafed [Pseudoephedrine] Other (See Comments)    Makes skin feel like its crawling    CURRENT MEDICATIONS:  Outpatient Encounter Medications as of 09/19/2017  Medication Sig  . acetaminophen (TYLENOL) 650 MG CR tablet Take 650 mg by mouth as needed for pain.  Marland Kitchen  aspirin EC 81 MG tablet Take 1 tablet (81 mg total) by mouth daily.  Marland Kitchen atorvastatin (LIPITOR) 40 MG tablet Take 40 mg by mouth daily.  . cholecalciferol (VITAMIN D) 1000 units tablet Take 2,500 Units daily by mouth.  . cholestyramine (QUESTRAN) 4 g packet Take 1 packet (4 g total) by mouth daily. Do not take within 2 hours of other medication  . dicyclomine (BENTYL) 10 MG capsule Take 1 tab 30 min before meals  . furosemide (LASIX) 20 MG tablet Take 1 tablet (20 mg total) by mouth daily. alternating (40 mg) every other day.  Marland Kitchen HUMALOG MIX 75/25 (75-25) 100 UNIT/ML SUSP injection Inject 20 Units into the skin 2 (two) times daily with a meal. Over 200 take (45 units) break up in three part to take 3 times daily  . losartan (COZAAR) 100 MG tablet Take 1 tablet (100 mg total) by mouth daily.  . metoprolol succinate (TOPROL-XL) 100 MG 24 hr tablet Take 1 tablet by mouth 2 (two) times daily.  . mupirocin ointment (BACTROBAN) 2 %   . potassium chloride (K-DUR) 10 MEQ tablet Take 10 mEq by mouth. 3 tablets daily (30 mEq total)  . Probiotic Product (PROBIOTIC-10 PO) Take daily by mouth.  . sitaGLIPtin (JANUVIA) 100 MG tablet Take 100 mg by mouth daily.  . vitamin  B-12 (CYANOCOBALAMIN) 1000 MCG tablet Take 1,000 mcg daily by mouth.  . vitamin E 100 UNIT capsule Take daily by mouth.   No facility-administered encounter medications on file as of 09/19/2017.     PAST MEDICAL HISTORY:   Past Medical History:  Diagnosis Date  . Biventricular ICD (implantable cardiac defibrillator) in place   . Cellulitis   . Change in bowel habits   . CHF (congestive heart failure) ([place])   . Colon polyps   . Diabetes mellitus    Type II  . Diarrhea   . Dyslipidemia   . HOH (hard of hearing)    left  . HTN (hypertension)   . Ischemic cardiomyopathy   . LBBB (left bundle branch block)    chronic  . Meniere's disease   . T wave oversensing 01/17/2014  . Ulcerative colitis   . Varicose veins    4 lazer   treatment each leg    PAST SURGICAL HISTORY:   Past Surgical History:  Procedure Laterality Date  . ABDOMINAL HYSTERECTOMY    . CARDIAC CATHETERIZATION  08/2011  . CHOLECYSTECTOMY    . COLONOSCOPY    . EP Study  08/22/08  . IMPLANTABLE CARDIOVERTER DEFIBRILLATOR GENERATOR CHANGE N/A 08/22/2013   Performed by Deboraha Sprang, MD at [facility] CATH LAB  . LEFT KNEE ARTHROSCOPY, PARTIAL MEDIAL AND PARTIAL LATERAL MENISCECTOMY, MEDIAL PLICA Left 2/83/1517   Performed by Dorna Leitz, MD at [facility]  . mastoid sugery     in the setting of Meniere's disease  . PACEMAKER INSERTION     Medtronic, remote- no.   . pericardial effusion  12/2002  . TONSILLECTOMY AND ADENOIDECTOMY      SOCIAL HISTORY:   Social History   Socioeconomic History  . Marital status: Single    Spouse name: Not on file  . Number of children: 2  . Years of education: Not on file  . Highest education level: Not on file  Social Needs  . Financial resource strain: Not on file  . Food insecurity - worry: Not on file  . Food insecurity - inability: Not on file  . Transportation needs - medical: Not on file  . Transportation needs - non-medical: Not on file  Occupational History  . Occupation: retired  Tobacco Use  . Smoking status: Former Smoker    Years: 1.00  . Smokeless tobacco: Never Used  . Tobacco comment: quit in 1970  Substance and Sexual Activity  . Alcohol use: No  . Drug use: No  . Sexual activity: Not Currently    Birth control/protection: Post-menopausal  Other Topics Concern  . Not on file  Social History Narrative   Widowed, has 2 children.     FAMILY HISTORY:   Family Status  Relation Name Status  . Father  Deceased at age 82       emphysema  . Mother  Deceased at age 10       CANCER  . Child x2 Alive  . Neg Hx  (Not Specified)    ROS:  A complete 10 system review of systems was obtained and was unremarkable apart from what is mentioned above.  PHYSICAL EXAMINATION:    VITALS:     Vitals:   09/19/17 0901  BP: (!) 142/92  Pulse: 88  SpO2: 96%  Weight: 134 lb (60.8 kg)  Height: 5' 6.75" (1.695 m)    GEN:  The patient appears stated age and is in NAD. HEENT:  Normocephalic, atraumatic.  The  mucous membranes are moist. The superficial temporal arteries are without ropiness or tenderness. CV:  RRR Lungs:  CTAB Neck/HEME:  There are no carotid bruits bilaterally.  Neurological examination:  Orientation: The patient is alert and oriented x3. Fund of knowledge is appropriate.  Recent and remote memory are intact.  Attention and concentration are normal.    Able to name objects and repeat phrases. Cranial nerves: There is good facial symmetry. Pupils are equal round and reactive to light bilaterally. Fundoscopic exam is attempted but the disc margins are not well visualized bilaterally. Extraocular muscles are intact. The visual fields are full to confrontational testing. The speech is fluent and clear. Soft palate rises symmetrically and there is no tongue deviation. Hearing is intact to conversational tone. Sensation: Sensation is intact to light and pinprick throughout (facial, trunk, extremities). Vibration is intact at the bilateral big toe. There is no extinction with double simultaneous stimulation. There is no sensory dermatomal level identified. Motor: Strength is 5/5 in the bilateral upper and lower extremities.   Shoulder shrug is equal and symmetric.  There is no pronator drift. Deep tendon reflexes: Deep tendon reflexes are 2/4 at the bilateral biceps, triceps, brachioradialis, patella and achilles. Plantar responses are downgoing bilaterally.  Movement examination: Tone: There is normal tone in the bilateral upper extremities.  The tone in the lower extremities is normal.  Abnormal movements: there is no tremor at rest, even with distraction.  There is no postural tremor.  There is very little difficulty with archimedes spirals.  There is some mild tremor on  the L with pouring water.   Coordination:  There is no decremation with RAM's, with any form of RAMS, including alternating supination and pronation of the forearm, hand opening and closing, finger taps, heel taps and toe taps. Gait and Station: The patient has no difficulty arising out of a deep-seated chair without the use of the hands. The patient's stride length is normal with a slight wide based.    ASSESSMENT/PLAN:  1.   Essential Tremor.  -I reassured her that I saw no evidence of a neurodegenerative process.  She likely has asymmetric ET.  This can happen 30% of the time.  If not already done so, recommend TSH and chem be done (likely already been completed).   We discussed medications and surgical interventions.   Much greater than 50% of this visit was spent in counseling and coordinating care.  Total face to face time:  45 min

## 2017-09-19 ENCOUNTER — Ambulatory Visit: Payer: Medicare Other | Admitting: Neurology

## 2017-09-19 ENCOUNTER — Encounter: Payer: Self-pay | Admitting: Neurology

## 2017-09-19 VITALS — BP 142/92 | HR 88 | Ht 66.75 in | Wt 134.0 lb

## 2017-09-19 DIAGNOSIS — G250 Essential tremor: Secondary | ICD-10-CM | POA: Diagnosis not present

## 2017-09-20 ENCOUNTER — Other Ambulatory Visit: Payer: Self-pay | Admitting: Physician Assistant

## 2017-09-29 ENCOUNTER — Ambulatory Visit: Payer: Medicare Other | Admitting: Physician Assistant

## 2017-09-29 ENCOUNTER — Encounter: Payer: Self-pay | Admitting: Physician Assistant

## 2017-09-29 VITALS — BP 144/64 | HR 80 | Ht 66.5 in | Wt 136.8 lb

## 2017-09-29 DIAGNOSIS — K529 Noninfective gastroenteritis and colitis, unspecified: Secondary | ICD-10-CM | POA: Diagnosis not present

## 2017-09-29 DIAGNOSIS — K580 Irritable bowel syndrome with diarrhea: Secondary | ICD-10-CM

## 2017-09-29 MED ORDER — CHOLESTYRAMINE 4 G PO PACK: PACK | ORAL | 12 refills | Status: DC

## 2017-09-29 MED ORDER — DICYCLOMINE HCL 10 MG PO CAPS: ORAL_CAPSULE | ORAL | 3 refills | Status: DC

## 2017-09-29 NOTE — Patient Instructions (Addendum)
We have sent the following medications to your pharmacy for you to pick up at your convenience: [facility] [place]. 1. Questran packets 2. Bentyl ( Dicyclomine) 10 mg  Follow up with Nicoletta Ba PA as needed.   Happy you are doing well!! It was a pleasure to see you.

## 2017-09-29 NOTE — Progress Notes (Signed)
Reviewed and agree with documentation and assessment and plan. K. Veena Irine Heminger , MD   

## 2017-09-29 NOTE — Progress Notes (Signed)
Subjective:    Patient ID: ELLIANNA Robinson, female    DOB: 1926-05-21, 83 y.o.   MRN: [id number]  HPI Grace Robinson is a very nice 83 year old white female known to Dr. Silverio Decamp  and myself.  She was last seen in December 2017.  Patient is followed here for chronic diarrhea felt secondary to IBS D.  He has a very long history of episodic diarrhea and urgency. Other medical problems include a nonischemic cardiomyopathy, history of V. tach, status post AICD, hypertension and adult onset diabetes mellitus. Patient has been doing very well over the past year and says that starting on the medications for her GI symptoms has been "life changing".  She does very well taking Bentyl 10 mg p.o. 3 times daily, and has been using Questran 4 g daily.  She very occasionally has a day of diarrhea refractory to that regimen and will use Imodium with good success. Says generally she is having 1-2 formed bowel movements per day, she has no complaints of abdominal pain cramping bloating or distention.  Her appetite has been good and generally she has been feeling well.  She needs refills on meds.  Review of Systems Pertinent positive and negative review of systems were noted in the above HPI section.  All other review of systems was otherwise negative.   Allergies  Allergen Reactions  . Carvedilol Other (See Comments)  . Vancomycin Other (See Comments)  . Sudafed [Pseudoephedrine] Other (See Comments)    Makes skin feel like its crawling   Patient Active Problem List   Diagnosis Date Noted  . Degenerative tear of posterior horn of medial meniscus of left knee 04/14/2016  . Tear of lateral meniscus of left knee 04/14/2016  . Synovial plica of left knee 02/63/7858  . T wave oversensing 01/17/2014  . Fall 07/10/2013  . Paroxysmal ventricular tachycardia ([place]) 12/13/2012  . Biventricular implantable cardioverter-defibrillator -Medtronic 12/08/2011  . Atrial tachycardia ([place]) 12/08/2011  . HTN (hypertension)  05/19/2011  . CARDIOMYOPATHY, PRIMARY, DILATED 12/01/2010   Social History   Socioeconomic History  . Marital status: Single    Spouse name: Not on file  . Number of children: 2  . Years of education: Not on file  . Highest education level: Not on file  Social Needs  . Financial resource strain: Not on file  . Food insecurity - worry: Not on file  . Food insecurity - inability: Not on file  . Transportation needs - medical: Not on file  . Transportation needs - non-medical: Not on file  Occupational History  . 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 Smoking status: Former Smoker    Years: 1.00  . Smokeless tobacco: Never Used  . Tobacco comment: quit in 1970  Substance and Sexual Activity  . Alcohol use: No  . Drug use: No  . Sexual activity: Not Currently    Birth control/protection: Post-menopausal  Other Topics Concern  . Not on file  Social History Narrative   Widowed, has 2 children.     Grace Robinson family history includes Breast cancer in her mother; Emphysema (age of onset: 53) in her father; Heart disease in her father; Lung cancer in her mother.      Objective:    Vitals:   09/29/17 1034  BP: (!) 144/64  Pulse: 80    Physical Exam well-developed elderly white female in no acute distress, very pleasant.  Blood pressure 144/64, pulse 80, height 5 foot  6, BMI 21.7.  Not further examined today discussion only       Assessment & Plan:   #80 83 year old white female with long history of chronic diarrhea felt secondary to IBS D.  She is doing very well on her current regimen and comes in today for routine follow-up for med refills. 2.  Nonischemic cardiomyopathy 3.  History of V. tach status post defibrillator placement 3.  Hypertension 4.  Adult onset diabetes mellitus  Plan; Patient will continue her current regimen, will refill Bentyl 10 mg p.o. 3 times daily with 90-day supply Refill Questran 4 g in 6 ounces of water or  juice daily to be taken 2 hours away from other medications, she may take a second dose if needed. Imodium AD as needed Patient will follow up in 1 year, and happy to see her as needed if she is having any problems. Greater than 50% of visit spent in counseling and management of GI issues.  [facility] Genia Harold PA-C 09/29/2017   Cc: Shon Baton, MD

## 2017-10-05 DIAGNOSIS — Z23 Encounter for immunization: Secondary | ICD-10-CM | POA: Diagnosis not present

## 2017-10-11 ENCOUNTER — Ambulatory Visit (INDEPENDENT_AMBULATORY_CARE_PROVIDER_SITE_OTHER): Payer: Medicare Other

## 2017-10-11 DIAGNOSIS — Z95810 Presence of automatic (implantable) cardiac defibrillator: Secondary | ICD-10-CM | POA: Diagnosis not present

## 2017-10-11 DIAGNOSIS — I5022 Chronic systolic (congestive) heart failure: Secondary | ICD-10-CM

## 2017-10-13 NOTE — Progress Notes (Signed)
EPIC Encounter for ICM Monitoring  Patient Name: Grace Robinson is a 83 y.o. female Date: 10/13/2017 Primary Care Physican: Shon Baton, MD Primary Cardiologist: Truitt Merle, NP Electrophysiologist: Faustino Congress Weight:134lbs (at office visit) Bi-V Pacing: 98.5%       Heart Failure questions reviewed, pt asymptomatic.   Thoracic impedance abnormal suggesting fluid accumulation for the last 2 days.  She has been eating foods with high salt content than ususal.  Prescribed dosage: Furosemide 1 tablet (20 mg total) by mouth daily alternating (40 mg) every other day.Potassium 10 mEq 3 tablets daily  Labs: 09/29/2017Creatinine 0.81, BUN 17, Potassium 3.9, Sodium 142  Recommendations: No changes.  She will cut back on salt intake.   Encouraged to call for fluid symptoms.  Follow-up plan: ICM clinic phone appointment on 11/14/2017.  Office appointment scheduled 11/23/2017 with Chanetta Marshall, NP.  Copy of ICM check sent to Dr. Caryl Comes.   3 month ICM trend: 10/11/2017    1 Year ICM trend:       [name], RN 10/13/2017 3:24 PM

## 2017-11-10 DIAGNOSIS — I10 Essential (primary) hypertension: Secondary | ICD-10-CM | POA: Diagnosis not present

## 2017-11-10 DIAGNOSIS — E1122 Type 2 diabetes mellitus with diabetic chronic kidney disease: Secondary | ICD-10-CM | POA: Diagnosis not present

## 2017-11-10 DIAGNOSIS — M859 Disorder of bone density and structure, unspecified: Secondary | ICD-10-CM | POA: Diagnosis not present

## 2017-11-10 DIAGNOSIS — E7849 Other hyperlipidemia: Secondary | ICD-10-CM | POA: Diagnosis not present

## 2017-11-14 ENCOUNTER — Ambulatory Visit (INDEPENDENT_AMBULATORY_CARE_PROVIDER_SITE_OTHER): Payer: Medicare Other

## 2017-11-14 DIAGNOSIS — Z95810 Presence of automatic (implantable) cardiac defibrillator: Secondary | ICD-10-CM

## 2017-11-14 DIAGNOSIS — I5022 Chronic systolic (congestive) heart failure: Secondary | ICD-10-CM

## 2017-11-15 NOTE — Progress Notes (Signed)
EPIC Encounter for ICM Monitoring  Patient Name: VENNESSA AFFINITO is a 84 y.o. female Date: 11/15/2017 Primary Care Physican: Shon Baton, MD Primary Cardiologist: Truitt Merle, NP Electrophysiologist: Faustino Congress Weight:134lbs (at office visit) Bi-V Pacing: 98.4%       Heart Failure questions reviewed, pt asymptomatic.   Thoracic impedance slightly abnormal suggesting fluid accumulation but close to baseline.  Prescribed dosage: Furosemide 1 tablet (20 mg total) by mouth daily alternating (40 mg) every other day.Potassium 10 mEq 3 tablets daily  Labs: 09/29/2017Creatinine 0.81, BUN 17, Potassium 3.9, Sodium 142  Recommendations: No changes.    Encouraged to call for fluid symptoms.  Follow-up plan: ICM clinic phone appointment on 12/26/2017.  Office appointment scheduled 11/23/2017 with Chanetta Marshall, NP.  Copy of ICM check sent to Dr. Caryl Comes.   3 month ICM trend: 11/14/2017    1 Year ICM trend:       [name], RN 11/15/2017 11:26 AM

## 2017-11-16 DIAGNOSIS — L84 Corns and callosities: Secondary | ICD-10-CM | POA: Diagnosis not present

## 2017-11-16 DIAGNOSIS — I739 Peripheral vascular disease, unspecified: Secondary | ICD-10-CM | POA: Diagnosis not present

## 2017-11-16 DIAGNOSIS — E1151 Type 2 diabetes mellitus with diabetic peripheral angiopathy without gangrene: Secondary | ICD-10-CM | POA: Diagnosis not present

## 2017-11-16 DIAGNOSIS — L603 Nail dystrophy: Secondary | ICD-10-CM | POA: Diagnosis not present

## 2017-11-18 NOTE — Progress Notes (Deleted)
Electrophysiology Office Note Date: 11/18/2017  ID:  Grace Robinson, DOB Aug 18, 1926, MRN [id number]  PCP: Shon Baton, MD Primary Cardiologist: [place] Electrophysiologist: Caryl Comes  CC: Routine ICD follow-up  Grace Robinson is a 84 y.o. female seen today for Dr Caryl Comes.  She presents today for routine electrophysiology followup.  Since last being seen in our clinic, the patient reports doing very well. She denies chest pain, palpitations, dyspnea, PND, orthopnea, nausea, vomiting, dizziness, syncope, edema, weight gain, or early satiety.  She has not had ICD shocks.   Device History: MDT CRTTD implanted 2003 for CHF; gen change with new RV lead 2006; gen change 2009; gen change 2014 History of appropriate therapy: Yes - ATP History of AAD therapy: No   Past Medical History:  Diagnosis Date  . Biventricular ICD (implantable cardiac defibrillator) in place   . Cellulitis   . Change in bowel habits   . CHF (congestive heart failure) ([place])   . Colon polyps   . Diabetes mellitus    Type II  . Diarrhea   . Dyslipidemia   . HOH (hard of hearing)    left  . HTN (hypertension)   . Ischemic cardiomyopathy   . LBBB (left bundle branch block)    chronic  . Meniere's disease   . T wave oversensing 01/17/2014  . Ulcerative colitis   . Varicose veins    4 lazer  treatment each leg   Past Surgical History:  Procedure Laterality Date  . ABDOMINAL HYSTERECTOMY    . CARDIAC CATHETERIZATION  08/2011  . CHOLECYSTECTOMY    . COLONOSCOPY    . EP Study  08/22/08  . IMPLANTABLE CARDIOVERTER DEFIBRILLATOR GENERATOR CHANGE N/A 08/22/2013   Procedure: IMPLANTABLE CARDIOVERTER DEFIBRILLATOR GENERATOR CHANGE;  Surgeon: Deboraha Sprang, MD;  Location[facility] C[facility] LAB;  Service: Cardiovascular;  Laterality: N/A;  . KNEE ARTHROSCOPY Left 04/14/2016   Procedure: LEFT KNEE ARTHROSCOPY, PARTIAL MEDIAL AND PARTIAL LATERAL MENISCECTOMY, MEDIAL PLICA;  Surgeon: Dorna Leitz, MD;  Location: [facility];  Service:  Orthopedics;  Laterality: Left;  . mastoid sugery     in the setting of Meniere's disease  . TONSILLECTOMY AND ADENOIDECTOMY      Current Outpatient Medications  Medication Sig Dispense Refill  . acetaminophen (TYLENOL) 650 MG CR tablet Take 650 mg by mouth as needed for pain.    Marland Kitchen aspirin EC 81 MG tablet Take 1 tablet (81 mg total) by mouth daily.    Marland Kitchen atorvastatin (LIPITOR) 40 MG tablet Take 40 mg by mouth daily.  3  . cholecalciferol (VITAMIN D) 1000 units tablet Take 2,500 Units daily by mouth.    . cholestyramine (QUESTRAN) 4 g packet Take 1 dose daily, may take a second dose daily as needed. 45 each 12  . dicyclomine (BENTYL) 10 MG capsule Take 1 tab 30 min before meals 270 capsule 3  . furosemide (LASIX) 20 MG tablet Take 1 tablet (20 mg total) by mouth daily. alternating (40 mg) every other day. 60 tablet 11  . HUMALOG MIX 75/25 (75-25) 100 UNIT/ML SUSP injection Inject 15 Units into the skin 2 (two) times daily with a meal. Over 200 take (45 units) break up in three part to take 3 times daily  1  . losartan (COZAAR) 100 MG tablet Take 1 tablet (100 mg total) by mouth daily. 90 tablet 3  . metoprolol succinate (TOPROL-XL) 100 MG 24 hr tablet Take 1 tablet by mouth 2 (two) times daily.    Marland Kitchen  Probiotic Product (PROBIOTIC-10 PO) Take daily by mouth.    . sitaGLIPtin (JANUVIA) 100 MG tablet Take 100 mg by mouth daily.    . vitamin B-12 (CYANOCOBALAMIN) 1000 MCG tablet Take 1,000 mcg daily by mouth.    . vitamin E 100 UNIT capsule Take daily by mouth.     No current facility-administered medications for this visit.     Allergies:   Carvedilol; Vancomycin; and Sudafed [pseudoephedrine]   Social History: Social History   Socioeconomic History  . Marital status: Single    Spouse name: Not on file  . Number of children: 2  . Years of education: Not on file  . Highest education level: Not on file  Social Needs  . Financial resource strain: Not on file  . Food insecurity - worry:  Not on file  . Food insecurity - inability: Not on file  . Transportation needs - medical: Not on file  . Transportation needs - non-medical: Not on file  Occupational History  . 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 Smoking status: Former Smoker    Years: 1.00  . Smokeless tobacco: Never Used  . Tobacco comment: quit in 1970  Substance and Sexual Activity  . Alcohol use: No  . Drug use: No  . Sexual activity: Not Currently    Birth control/protection: Post-menopausal  Other Topics Concern  . Not on file  Social History Narrative   Widowed, has 2 children.     Family History: Family History  Problem Relation Age of Onset  . Emphysema Father 43  . Heart disease Father   . Breast cancer Mother   . Lung cancer Mother   . Colon cancer Neg Hx   . Rectal cancer Neg Hx   . Stomach cancer Neg Hx   . Liver cancer Neg Hx   . Esophageal cancer Neg Hx     Review of Systems: All other systems reviewed and are otherwise negative except as noted above.   Physical Exam: VS:  There were no vitals taken for this visit. , BMI There is no height or weight on file to calculate BMI.  GEN- The patient is well appearing, alert and oriented x 3 today.   HEENT: normocephalic, atraumatic; sclera clear, conjunctiva pink; hearing intact; oropharynx clear; neck supple, no JVP Lymph- no cervical lymphadenopathy Lungs- Clear to ausculation bilaterally, normal work of breathing.  No wheezes, rales, rhonchi Heart- Regular rate and rhythm, no murmurs, rubs or gallops, PMI not laterally displaced GI- soft, non-tender, non-distended, bowel sounds present, no hepatosplenomegaly Extremities- no clubbing, cyanosis, or edema; DP/PT/radial pulses 2+ bilaterally MS- no significant deformity or atrophy Skin- warm and dry, no rash or lesion; ICD pocket well healed Psych- euthymic mood, full affect Neuro- strength and sensation are intact  ICD interrogation- reviewed in  detail today,  See PACEART report  EKG:  EKG is not ordered today.  Recent Labs: No results found for requested labs within last 8760 hours.   Wt Readings from Last 3 Encounters:  09/29/17 136 lb 12.8 oz (62.1 kg)  09/19/17 134 lb (60.8 kg)  06/15/17 132 lb 3.2 oz (60 kg)     Other studies Reviewed: Additional studies/ records that were reviewed today include: Dr Caryl Comes and [place] office notes   Assessment and Plan:  1.  Chronic systolic dysfunction euvolemic today Stable on an appropriate medical regimen Normal ICD function See P[facility] Art report No changes today  2.  HTN  Stable No change required today  3.  Ventricular tachycardia No recent recurrence   Current medicines are reviewed at length with the patient today.   The patient does not have concerns regarding her medicines.  The following changes were made today:  none  Labs/ tests ordered today include: none No orders of the defined types were placed in this encounter.    Disposition:   Follow up with [facility], Dr Caryl Comes 1 year, Grace Robinson as scheduled     Signed, Chanetta Marshall, NP 11/18/2017 7:19 PM  [place] [address] [phone] (office) [phone] (fax)

## 2017-11-21 DIAGNOSIS — L821 Other seborrheic keratosis: Secondary | ICD-10-CM | POA: Diagnosis not present

## 2017-11-21 DIAGNOSIS — Z85828 Personal history of other malignant neoplasm of skin: Secondary | ICD-10-CM | POA: Diagnosis not present

## 2017-11-21 DIAGNOSIS — L820 Inflamed seborrheic keratosis: Secondary | ICD-10-CM | POA: Diagnosis not present

## 2017-11-21 DIAGNOSIS — D044 Carcinoma in situ of skin of scalp and neck: Secondary | ICD-10-CM | POA: Diagnosis not present

## 2017-11-21 DIAGNOSIS — D485 Neoplasm of uncertain behavior of skin: Secondary | ICD-10-CM | POA: Diagnosis not present

## 2017-11-22 DIAGNOSIS — R251 Tremor, unspecified: Secondary | ICD-10-CM | POA: Diagnosis not present

## 2017-11-22 DIAGNOSIS — Z0000 Encounter for general adult medical examination without abnormal findings: Secondary | ICD-10-CM | POA: Diagnosis not present

## 2017-11-22 DIAGNOSIS — C4492 Squamous cell carcinoma of skin, unspecified: Secondary | ICD-10-CM | POA: Diagnosis not present

## 2017-11-22 DIAGNOSIS — E1122 Type 2 diabetes mellitus with diabetic chronic kidney disease: Secondary | ICD-10-CM | POA: Diagnosis not present

## 2017-11-23 ENCOUNTER — Encounter: Payer: Medicare Other | Admitting: Nurse Practitioner

## 2017-11-29 DIAGNOSIS — Z1212 Encounter for screening for malignant neoplasm of rectum: Secondary | ICD-10-CM | POA: Diagnosis not present

## 2017-12-08 ENCOUNTER — Ambulatory Visit (INDEPENDENT_AMBULATORY_CARE_PROVIDER_SITE_OTHER): Payer: Medicare Other | Admitting: *Deleted

## 2017-12-08 ENCOUNTER — Telehealth: Payer: Self-pay | Admitting: Cardiology

## 2017-12-08 DIAGNOSIS — I428 Other cardiomyopathies: Secondary | ICD-10-CM | POA: Diagnosis not present

## 2017-12-08 LAB — CUP PACEART REMOTE DEVICE CHECK
Battery Remaining Longevity: 53 mo
Brady Statistic AS VP Percent: 75.43 %
Brady Statistic AS VS Percent: 1.07 %
Date Time Interrogation Session: 20190207195936
HIGH POWER IMPEDANCE MEASURED VALUE: 53 Ohm
HighPow Impedance: 76 Ohm
Implantable Lead Implant Date: 20031007
Implantable Lead Implant Date: 20060227
Implantable Lead Location: 753858
Implantable Lead Location: 753859
Implantable Lead Location: 753860
Implantable Lead Model: 4470
Implantable Lead Model: 6949
Implantable Lead Serial Number: 337988
Implantable Lead Serial Number: 361236
Lead Channel Impedance Value: 342 Ohm
Lead Channel Impedance Value: 475 Ohm
Lead Channel Pacing Threshold Amplitude: 0.75 V
Lead Channel Pacing Threshold Amplitude: 1.125 V
Lead Channel Pacing Threshold Pulse Width: 0.4 ms
Lead Channel Sensing Intrinsic Amplitude: 1.5 mV
Lead Channel Sensing Intrinsic Amplitude: 31.625 mV
Lead Channel Setting Pacing Amplitude: 2.5 V
MDC IDC LEAD IMPLANT DT: 20031007
MDC IDC LEAD IMPLANT DT: 20031007
MDC IDC LEAD LOCATION: 753860
MDC IDC MSMT BATTERY VOLTAGE: 2.97 V
MDC IDC MSMT LEADCHNL LV IMPEDANCE VALUE: 4047 Ohm
MDC IDC MSMT LEADCHNL LV IMPEDANCE VALUE: 4047 Ohm
MDC IDC MSMT LEADCHNL RA IMPEDANCE VALUE: 456 Ohm
MDC IDC MSMT LEADCHNL RA PACING THRESHOLD AMPLITUDE: 0.875 V
MDC IDC MSMT LEADCHNL RA PACING THRESHOLD PULSEWIDTH: 0.4 ms
MDC IDC MSMT LEADCHNL RA SENSING INTR AMPL: 1.5 mV
MDC IDC MSMT LEADCHNL RV IMPEDANCE VALUE: 456 Ohm
MDC IDC MSMT LEADCHNL RV PACING THRESHOLD PULSEWIDTH: 0.4 ms
MDC IDC MSMT LEADCHNL RV SENSING INTR AMPL: 31.625 mV
MDC IDC PG IMPLANT DT: 20141022
MDC IDC SET LEADCHNL LV PACING AMPLITUDE: 1.75 V
MDC IDC SET LEADCHNL LV PACING PULSEWIDTH: 0.4 ms
MDC IDC SET LEADCHNL RA PACING AMPLITUDE: 2 V
MDC IDC SET LEADCHNL RV PACING PULSEWIDTH: 0.4 ms
MDC IDC SET LEADCHNL RV SENSING SENSITIVITY: 0.6 mV
MDC IDC STAT BRADY AP VP PERCENT: 23.19 %
MDC IDC STAT BRADY AP VS PERCENT: 0.31 %
MDC IDC STAT BRADY RA PERCENT PACED: 23.45 %
MDC IDC STAT BRADY RV PERCENT PACED: 0.77 %

## 2017-12-08 NOTE — Telephone Encounter (Signed)
Spoke with pt and reminded pt of remote transmission that is due today. Pt verbalized understanding.   

## 2017-12-08 NOTE — Progress Notes (Signed)
Remote ICD transmission.   

## 2017-12-09 ENCOUNTER — Encounter (INDEPENDENT_AMBULATORY_CARE_PROVIDER_SITE_OTHER): Payer: Self-pay

## 2017-12-09 ENCOUNTER — Ambulatory Visit: Payer: Medicare Other | Admitting: Nurse Practitioner

## 2017-12-09 ENCOUNTER — Encounter: Payer: Self-pay | Admitting: Nurse Practitioner

## 2017-12-09 VITALS — BP 122/80 | HR 84 | Ht 66.5 in | Wt 128.0 lb

## 2017-12-09 DIAGNOSIS — I5022 Chronic systolic (congestive) heart failure: Secondary | ICD-10-CM | POA: Diagnosis not present

## 2017-12-09 DIAGNOSIS — I10 Essential (primary) hypertension: Secondary | ICD-10-CM

## 2017-12-09 DIAGNOSIS — I472 Ventricular tachycardia, unspecified: Secondary | ICD-10-CM

## 2017-12-09 NOTE — Progress Notes (Signed)
Electrophysiology Office Note Date: 12/09/2017  ID:  Skyrah, Krupp [birth year], MRN [id number]  PCP: Shon Baton, MD Primary Cardiologist: [place] Electrophysiologist: Caryl Comes  CC: Routine ICD follow-up  Grace Robinson is a 84 y.o. female seen today for Dr Caryl Comes.  She presents today for routine electrophysiology followup.  Since last being seen in our clinic, the patient reports doing very well. She has chronic dependent edema. She denies chest pain, palpitations, dyspnea, PND, orthopnea, nausea, vomiting, dizziness, syncope, weight gain, or early satiety.  She has not had ICD shocks.   Device History: MDT CRTD implanted 2003 for CHF; gen change with new RV lead 2006; gen change 2009; gen change 2014 History of appropriate therapy: Yes - ATP History of AAD therapy: No   Past Medical History:  Diagnosis Date  . Biventricular ICD (implantable cardiac defibrillator) in place   . Cellulitis   . Change in bowel habits   . CHF (congestive heart failure) ([place])   . Colon polyps   . Diabetes mellitus    Type II  . Diarrhea   . Dyslipidemia   . HOH (hard of hearing)    left  . HTN (hypertension)   . Ischemic cardiomyopathy   . LBBB (left bundle branch block)    chronic  . Meniere's disease   . T wave oversensing 01/17/2014  . Ulcerative colitis   . Varicose veins    4 lazer  treatment each leg   Past Surgical History:  Procedure Laterality Date  . ABDOMINAL HYSTERECTOMY    . CARDIAC CATHETERIZATION  08/2011  . CHOLECYSTECTOMY    . COLONOSCOPY    . EP Study  08/22/08  . IMPLANTABLE CARDIOVERTER DEFIBRILLATOR GENERATOR CHANGE N/A 08/22/2013   Procedure: IMPLANTABLE CARDIOVERTER DEFIBRILLATOR GENERATOR CHANGE;  Surgeon: Deboraha Sprang, MD;  Location: [facility] CATH LAB;  Service: Cardiovascular;  Laterality: N/A;  . KNEE ARTHROSCOPY Left 04/14/2016   Procedure: LEFT KNEE ARTHROSCOPY, PARTIAL MEDIAL AND PARTIAL LATERAL MENISCECTOMY, MEDIAL PLICA;  Surgeon: Dorna Leitz, MD;   Location: [place];  Service: Orthopedics;  Laterality: Left;  . mastoid sugery     in the setting of Meniere's disease  . TONSILLECTOMY AND ADENOIDECTOMY      Current Outpatient Medications  Medication Sig Dispense Refill  . acetaminophen (TYLENOL) 650 MG CR tablet Take 650 mg by mouth as needed for pain.    . Ascorbic Acid (VITAMIN C) 100 MG tablet Take 100 mg by mouth daily.    Marland Kitchen aspirin EC 81 MG tablet Take 1 tablet (81 mg total) by mouth daily.    Marland Kitchen atorvastatin (LIPITOR) 40 MG tablet Take 40 mg by mouth daily.  3  . cholecalciferol (VITAMIN D) 1000 units tablet Take 2,500 Units daily by mouth.    . cholestyramine (QUESTRAN) 4 g packet Take 1 dose daily, may take a second dose daily as needed. 45 each 12  . dicyclomine (BENTYL) 10 MG capsule Take 1 tab 30 min before meals 270 capsule 3  . furosemide (LASIX) 20 MG tablet Take 1 tablet (20 mg total) by mouth daily. alternating (40 mg) every other day. 60 tablet 11  . HUMALOG MIX 75/25 (75-25) 100 UNIT/ML SUSP injection Inject 22 Units into the skin 2 (two) times daily with a meal. Over 200 take (45 units) break up in three part to take 3 times daily  1  . losartan (COZAAR) 100 MG tablet Take 1 tablet (100 mg total) by mouth daily. 90 tablet 3  .  metoprolol succinate (TOPROL-XL) 100 MG 24 hr tablet Take 1 tablet by mouth 2 (two) times daily.    . potassium chloride (K-DUR) 10 MEQ tablet Take 10 mEq by mouth daily.    . Probiotic Product (PROBIOTIC-10 PO) Take daily by mouth.    . sitaGLIPtin (JANUVIA) 100 MG tablet Take 100 mg by mouth daily.    . vitamin B-12 (CYANOCOBALAMIN) 1000 MCG tablet Take 1,000 mcg daily by mouth.    . vitamin E 100 UNIT capsule Take daily by mouth.     No current facility-administered medications for this visit.     Allergies:   Carvedilol; Vancomycin; and Sudafed [pseudoephedrine]   Social History: Social History   Socioeconomic History  . Marital status: Single    Spouse name: Not on file  . Number of  children: 2  . Years of education: Not on file  . Highest education level: Not on file  Social Needs  . Financial resource strain: Not on file  . Food insecurity - worry: Not on file  . Food insecurity - inability: Not on file  . Transportation needs - medical: Not on file  . Transportation needs - non-medical: Not on file  Occupational History  . 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 Smoking status: Former Smoker    Years: 1.00  . Smokeless tobacco: Never Used  . Tobacco comment: quit in 1970  Substance and Sexual Activity  . Alcohol use: No  . Drug use: No  . Sexual activity: Not Currently    Birth control/protection: Post-menopausal  Other Topics Concern  . Not on file  Social History Narrative   Widowed, has 2 children.     Family History: Family History  Problem Relation Age of Onset  . Emphysema Father 34  . Heart disease Father   . Breast cancer Mother   . Lung cancer Mother   . Colon cancer Neg Hx   . Rectal cancer Neg Hx   . Stomach cancer Neg Hx   . Liver cancer Neg Hx   . Esophageal cancer Neg Hx     Review of Systems: All other systems reviewed and are otherwise negative except as noted above.   Physical Exam: VS:  BP 122/80   Pulse 84   Ht 5' 6.5" (1.689 m)   Wt 128 lb (58.1 kg)   BMI 20.35 kg/m  , BMI Body mass index is 20.35 kg/m.  GEN- The patient is elderly appearing, alert and oriented x 3 today.   HEENT: normocephalic, atraumatic; sclera clear, conjunctiva pink; hearing intact; oropharynx clear; neck supple  Lungs- Clear to ausculation bilaterally, normal work of breathing.  No wheezes, rales, rhonchi Heart- Regular rate and rhythm(paced) GI- soft, non-tender, non-distended, bowel sounds present  Extremities- no clubbing, cyanosis, or edema MS- no significant deformity or atrophy Skin- warm and dry, no rash or lesion; ICD pocket well healed Psych- euthymic mood, full affect Neuro- strength and sensation  are intact  ICD interrogation- reviewed in detail today,  See PACEART report  EKG:  EKG is not ordered today.  Recent Labs: No results found for requested labs within last 8760 hours.   Wt Readings from Last 3 Encounters:  12/09/17 128 lb (58.1 kg)  09/29/17 136 lb 12.8 oz (62.1 kg)  09/19/17 134 lb (60.8 kg)     Other studies Reviewed: Additional studies/ records that were reviewed today include: Dr Caryl Comes and [place] office notes   Assessment and  Plan:  1.  Chronic systolic dysfunction euvolemic today EF normalized post CRT Stable on an appropriate medical regimen Normal ICD function See Pace Art report No changes today  2.  HTN Stable No change required today  3.  Ventricular tachycardia No recent recurrence   Current medicines are reviewed at length with the patient today.   The patient does not have concerns regarding her medicines.  The following changes were made today:  none  Labs/ tests ordered today include: none Orders Placed This Encounter  Procedures  . EKG 12-Lead     Disposition:   Follow up with [facility], Dr Caryl Comes 1 year, Cecille Rubin as scheduled     Signed, Chanetta Marshall, NP 12/09/2017 3:13 PM  [place] [address] [place] [address] [phone] (office) [phone] (fax)

## 2017-12-09 NOTE — Patient Instructions (Addendum)
  Medication Instructions:   Your physician recommends that you continue on your current medications as directed. Please refer to the Current Medication list given to you today.   If you need a refill on your cardiac medications before your next appointment, please call your pharmacy.  Labwork: NONE ORDERED  TODAY    Testing/Procedures:  NONE ORDERED  TODAY    Follow-Up:  Your physician wants you to follow-up in: [place] will receive a reminder letter in the mail two months in advance. If you don't receive a letter, please call our office to schedule the follow-up appointment.   Remote monitoring is used to monitor your Pacemaker of ICD from home. This monitoring reduces the number of office visits required to check your device to one time per year. It allows [place] to keep an eye on the functioning of your device to ensure it is working properly. You are scheduled for a device check from home on .  03-09-2018 You may send your transmission at any time that day. If you have a wireless device, the transmission will be sent automatically. After your physician reviews your transmission, you will receive a postcard with your next transmission date.   Any Other Special Instructions Will Be Listed Below (If Applicable).                                                                                                                                      Melbeta BP FOR A COLD

## 2017-12-14 ENCOUNTER — Encounter: Payer: Self-pay | Admitting: Cardiology

## 2017-12-26 ENCOUNTER — Ambulatory Visit (INDEPENDENT_AMBULATORY_CARE_PROVIDER_SITE_OTHER): Payer: Self-pay

## 2017-12-26 ENCOUNTER — Telehealth: Payer: Self-pay

## 2017-12-26 DIAGNOSIS — I5022 Chronic systolic (congestive) heart failure: Secondary | ICD-10-CM

## 2017-12-26 DIAGNOSIS — Z95810 Presence of automatic (implantable) cardiac defibrillator: Secondary | ICD-10-CM

## 2017-12-26 NOTE — Telephone Encounter (Signed)
Remote ICM transmission received.  Attempted call to patient and left detailed message per DPR regarding transmission and next ICM scheduled for 01/09/2018.  Advised to return call for any fluid symptoms or questions.

## 2017-12-26 NOTE — Progress Notes (Signed)
EPIC Encounter for ICM Monitoring  Patient Name: Grace Robinson is a 84 y.o. female Date: 12/26/2017 Primary Care Physican: Shon Baton, MD Primary Cardiologist: Truitt Merle, NP Electrophysiologist: Faustino Congress Weight:Previouse weight 134lbs (at office visit) Bi-V Pacing: 98.4%        Attempted call to patient and unable to reach.  Left detailed message regarding transmission.  Transmission reviewed.    Thoracic impedance close to baseline normal.  Prescribed dosage: Furosemide 1 tablet (20 mg total) by mouth daily alternating (40 mg) every other day.Potassium 10 mEq 3 tablets daily  Labs: 09/29/2017Creatinine 0.81, BUN 17, Potassium 3.9, Sodium 142  Recommendations: Left voice mail with ICM number and encouraged to call if experiencing any fluid symptoms.  Follow-up plan: ICM clinic phone appointment on 01/09/2018.   Copy of ICM check sent to Dr. Caryl Comes.   3 month ICM trend: 12/26/2017    1 Year ICM trend:       [name], RN 12/26/2017 10:46 AM

## 2018-01-09 ENCOUNTER — Ambulatory Visit (INDEPENDENT_AMBULATORY_CARE_PROVIDER_SITE_OTHER): Payer: Medicare Other

## 2018-01-09 DIAGNOSIS — I5022 Chronic systolic (congestive) heart failure: Secondary | ICD-10-CM

## 2018-01-09 DIAGNOSIS — Z95810 Presence of automatic (implantable) cardiac defibrillator: Secondary | ICD-10-CM

## 2018-01-09 NOTE — Progress Notes (Signed)
EPIC Encounter for ICM Monitoring  Patient Name: Grace Robinson is a 84 y.o. female Date: 01/09/2018 Primary Care Physican: Grace Baton, MD Primary Cardiologist: Grace Merle, NP Electrophysiologist: Grace Robinson Weight: 134lbs (at office visit-she does not weigh at home) Bi-V Pacing: 98.3%       Heart Failure questions reviewed, pt asymptomatic.   Thoracic impedance normal.  Prescribed dosage: Furosemide 1 tablet (20 mg total) by mouth daily alternating (40 mg) every other day.Potassium 10 mEq 3 tablets daily  Labs: 09/29/2017Creatinine 0.81, BUN 17, Potassium 3.9, Sodium 142  Recommendations: No changes.   Encouraged to call for fluid symptoms.  Follow-up plan: ICM clinic phone appointment on 02/09/2018.  Office appointment scheduled 02/07/2018 with Grace Merle, NP.  Copy of ICM check sent to Dr. Caryl Robinson.   3 month ICM trend: 01/09/2018    1 Year ICM trend:       Grace Billings, RN 01/09/2018 10:35 AM

## 2018-01-20 DIAGNOSIS — E1122 Type 2 diabetes mellitus with diabetic chronic kidney disease: Secondary | ICD-10-CM | POA: Diagnosis not present

## 2018-01-23 ENCOUNTER — Telehealth: Payer: Self-pay | Admitting: Physician Assistant

## 2018-01-23 ENCOUNTER — Telehealth: Payer: Self-pay | Admitting: *Deleted

## 2018-01-23 NOTE — Telephone Encounter (Signed)
I informed the patient that Nicoletta Ba PA is not at all concerned about her being on this Dicyclomine 10 mg medication.  [name] has prescribed this for her in the past.  Also I called [facility].  They have a prescription from [facility], # 90 with refills on hold.  They cannot fill it until 02-04-2018.  The patient does have enough of this medication to get her through until April.  She thanked me for talking to [name] and checking this out with the pharmacy.

## 2018-01-24 NOTE — Telephone Encounter (Signed)
See Phone note from 01-23-2018 at 4:20 PM.

## 2018-02-02 DIAGNOSIS — E1151 Type 2 diabetes mellitus with diabetic peripheral angiopathy without gangrene: Secondary | ICD-10-CM | POA: Diagnosis not present

## 2018-02-02 DIAGNOSIS — L603 Nail dystrophy: Secondary | ICD-10-CM | POA: Diagnosis not present

## 2018-02-02 DIAGNOSIS — L84 Corns and callosities: Secondary | ICD-10-CM | POA: Diagnosis not present

## 2018-02-02 DIAGNOSIS — I739 Peripheral vascular disease, unspecified: Secondary | ICD-10-CM | POA: Diagnosis not present

## 2018-02-06 ENCOUNTER — Telehealth: Payer: Self-pay | Admitting: *Deleted

## 2018-02-06 NOTE — Telephone Encounter (Signed)
I advised the patient that the # 90 with 11 refills cost for her is $32.53 a month.  I explained that the insurance company wanted [place] to do a prior authorization. I told her that the other medications in this family of drugs are more expensive and she hasn't tried anything else, which is a question the insurance company would ask me.  She told me she was very satisfied with the Dicyclomine 10 mg, 1 tab with meals. It works well for her. She doesn't want to try another medication.  The cost is no problem. She asked me how many refills are there and I told her 11 and she said that is great. I told her the pharmacist is getting the prescription ready for her. She thanked me for taking care of this.

## 2018-02-07 ENCOUNTER — Encounter (INDEPENDENT_AMBULATORY_CARE_PROVIDER_SITE_OTHER): Payer: Self-pay

## 2018-02-07 ENCOUNTER — Encounter: Payer: Self-pay | Admitting: Nurse Practitioner

## 2018-02-07 ENCOUNTER — Ambulatory Visit: Payer: Medicare Other | Admitting: Nurse Practitioner

## 2018-02-07 VITALS — BP 140/80 | HR 78 | Ht 66.75 in | Wt 140.8 lb

## 2018-02-07 DIAGNOSIS — I5022 Chronic systolic (congestive) heart failure: Secondary | ICD-10-CM | POA: Diagnosis not present

## 2018-02-07 DIAGNOSIS — I10 Essential (primary) hypertension: Secondary | ICD-10-CM

## 2018-02-07 DIAGNOSIS — Z95810 Presence of automatic (implantable) cardiac defibrillator: Secondary | ICD-10-CM | POA: Diagnosis not present

## 2018-02-07 NOTE — Progress Notes (Signed)
CARDIOLOGY OFFICE NOTE  Date:  02/07/2018    Grace Robinson Date of Birth: 1926/05/29 Medical Record #[id number]  PCP:  Grace Baton, MD  Cardiologist:  Grace Robinson/Grace Robinson    Chief Complaint  Patient presents with  . Congestive Heart Failure    8 month check    History of Present Illness: Grace Robinson is a 84 y.o. female who presents today for a 8 month check. Seen for Grace Robinson and Grace Robinson. She is a former patient of Grace Robinson. Primarily follows with me.   She has an nonischemic CM with remote EF down to 25% that has improved with aggressive medical therapy and CRT-D. Last generator change in 2014. Other issues include HLD, IDDM, ulcerative colitis, LBBB, Meniere's disease and advanced age. Last echo back in 07/2014 showed her EF had normalized at 50 to 55%.   I have seen her back several times. She has basically done pretty well from our standpoint. Some occasional issues with swelling/elevated BP but for the most part has done ok. I sent her to see Grace Robinson with GI who helped her tremendously with her diarrhea.   Last seen by me back in August. Saw Grace Robinson in February.   Robinson back today. Here alone. She just looks amazing. Doing very well. She has not been as active due to the weather but feels good. No chest pain. Her breathing is good. She will have some swelling that progresses over the course of the day - goes down overnight. She had labs back in January with PCP - this looked good - KPN noted. She has had more issues with a skin cancer on the top of her head.   Past Medical History:  Diagnosis Date  . Biventricular ICD (implantable cardiac defibrillator) in place   . Cellulitis   . Change in bowel habits   . CHF (congestive heart failure) ([place])   . Colon polyps   . Diabetes mellitus    Type II  . Diarrhea   . Dyslipidemia   . HOH (hard of hearing)    left  . HTN (hypertension)   . Ischemic cardiomyopathy   . LBBB (left bundle branch block)    chronic  . Meniere's disease   . T wave oversensing 01/17/2014  . Ulcerative colitis   . Varicose veins    4 lazer  treatment each leg    Past Surgical History:  Procedure Laterality Date  . ABDOMINAL HYSTERECTOMY    . CARDIAC CATHETERIZATION  08/2011  . CHOLECYSTECTOMY    . COLONOSCOPY    . EP Study  08/22/08  . IMPLANTABLE CARDIOVERTER DEFIBRILLATOR GENERATOR CHANGE N/A 08/22/2013   Procedure: IMPLANTABLE CARDIOVERTER DEFIBRILLATOR GENERATOR CHANGE;  Surgeon: Grace Sprang, MD;  Location: [facility] CATH LAB;  Service: Cardiovascular;  Laterality: N/A;  . KNEE ARTHROSCOPY Left 04/14/2016   Procedure: LEFT KNEE ARTHROSCOPY, PARTIAL MEDIAL AND PARTIAL LATERAL MENISCECTOMY, MEDIAL PLICA;  Surgeon: Grace Leitz, MD;  Location: [place];  Service: Orthopedics;  Laterality: Left;  . mastoid sugery     in the setting of Meniere's disease  . TONSILLECTOMY AND ADENOIDECTOMY       Medications: Current Meds  Medication Sig  . acetaminophen (TYLENOL) 650 MG CR tablet Take 650 mg by mouth as needed for pain.  . Ascorbic Acid (VITAMIN C) 100 MG tablet Take 100 mg by mouth daily.  Marland Kitchen aspirin EC 81 MG tablet Take 1 tablet (81 mg total) by mouth daily.  Marland Kitchen atorvastatin (  LIPITOR) 40 MG tablet Take 40 mg by mouth daily.  . cholecalciferol (VITAMIN D) 1000 units tablet Take 2,500 Units daily by mouth.  . cholestyramine (QUESTRAN) 4 g packet Take 1 dose daily, may take a second dose daily as needed.  . dicyclomine (BENTYL) 10 MG capsule Take 1 tab 30 min before meals  . furosemide (LASIX) 20 MG tablet Take 1 tablet (20 mg total) by mouth daily. alternating (40 mg) every other day.  Marland Kitchen HUMALOG MIX 75/25 (75-25) 100 UNIT/ML SUSP injection Inject 22 Units into the skin 2 (two) times daily with a meal. Over 200 take (45 units) break up in three part to take 3 times daily  . metoprolol succinate (TOPROL-XL) 100 MG 24 hr tablet Take 1 tablet by mouth 2 (two) times daily.  Marland Kitchen olmesartan (BENICAR) 40 MG tablet Take 40 mg  by mouth daily.  . potassium chloride (K-DUR) 10 MEQ tablet Take 10 mEq by mouth daily.  . Probiotic Product (PROBIOTIC-10 PO) Take daily by mouth.  . sitaGLIPtin (JANUVIA) 100 MG tablet Take 100 mg by mouth daily.  . vitamin B-12 (CYANOCOBALAMIN) 1000 MCG tablet Take 1,000 mcg daily by mouth.  . vitamin E 100 UNIT capsule Take daily by mouth.  . [DISCONTINUED] losartan (COZAAR) 100 MG tablet Take 1 tablet (100 mg total) by mouth daily.     Allergies: Allergies  Allergen Reactions  . Carvedilol Other (See Comments)  . Vancomycin Other (See Comments)  . Sudafed [Pseudoephedrine] Other (See Comments)    Makes skin feel like its crawling    Social History: The patient  reports that she has quit smoking. She quit after 1.00 year of use. She has never used smokeless tobacco. She reports that she does not drink alcohol or use drugs.   Family History: The patient's family history includes Breast cancer in her mother; Emphysema (age of onset: 36) in her father; Heart disease in her father; Lung cancer in her mother.   Review of Systems: Please see the history of present illness.   Otherwise, the review of systems is positive for none.   All other systems are reviewed and negative.   Physical Exam: VS:  BP 140/80 (BP Location: Left Arm, Patient Position: Sitting, Cuff Size: Normal)   Pulse 78   Ht 5' 6.75" (1.695 m)   Wt 140 lb 12.8 oz (63.9 kg)   SpO2 98% Comment: at rest  BMI 22.22 kg/m  .  BMI Body mass index is 22.22 kg/m.  Wt Readings from Last 3 Encounters:  02/07/18 140 lb 12.8 oz (63.9 kg)  12/09/17 128 lb (58.1 kg)  09/29/17 136 lb 12.8 oz (62.1 kg)    General: Pleasant. Looks younger than her stated age. Alert and in no acute distress.   HEENT: Normal.  Neck: Supple, no JVD, carotid bruits, or masses noted.  Cardiac: Regular rate and rhythm. No murmurs, rubs, or gallops. No edema.  Respiratory:  Lungs are clear to auscultation bilaterally with normal work of  breathing.  GI: Soft and nontender.  MS: No deformity or atrophy. Gait and ROM intact.  Skin: Warm and dry. Color is normal.  Neuro:  Strength and sensation are intact and no gross focal deficits noted.  Psych: Alert, appropriate and with normal affect.   LABORATORY DATA:  EKG:  EKG is not ordered today.  Lab Results  Component Value Date   WBC 5.8 09/06/2016   HGB 13.6 09/06/2016   HCT 39.7 09/06/2016   PLT 220.0 09/06/2016  GLUCOSE 237 (H) 07/30/2016   ALT 19 07/30/2016   AST 22 07/30/2016   NA 142 07/30/2016   K 3.9 07/30/2016   CL 103 07/30/2016   CREATININE 0.81 07/30/2016   BUN 17 07/30/2016   CO2 27 07/30/2016   TSH 1.23 07/20/2011   INR 1.1 (H) 08/13/2013       BNP (last 3 results) No results for input(s): BNP in the last 8760 hours.  ProBNP (last 3 results) No results for input(s): PROBNP in the last 8760 hours.   Other Studies Reviewed Today:  Echo Study Conclusions from 07/2014  - Left ventricle: The cavity size was normal. Systolic function was normal. The estimated ejection fraction was in the range of 50% to 55%. There was an increased relative contribution of atrial contraction to ventricular filling. Doppler parameters are consistent with abnormal left ventricular relaxation (grade 1 diastolic dysfunction). - Ventricular septum: Septal motion showed paradox. These changes are consistent with right ventricular pacing. - Aortic valve: There was mild regurgitation. - Mitral valve: There is a calcified mobile density in the LV cavity most consistent with calcified redundant chordae tendinae. This was present on echo in 2014. There was mild regurgitation. - Pulmonic valve: There was trivial regurgitation.  Assessment / Plan: 1. NICM - with normalization of her EF - 50 to 55% by echo from September of 2015 - she is doing just great. No changes made today - did suggest support stockings.   3. Underlying CRT-D - followed by Dr.  Caryl Robinson - to follow up with EP in 12/2018  4. HTN - BP looks fine - no changes made today  5. Advanced age - overall, she continues to do remarkably well.   6. Diarrhea - basically resolved. Not discussed today.   7. Advanced age - encouraged her to stay safe. Stay active.   Current medicines are reviewed with the patient today.  The patient does not have concerns regarding medicines other than what has been noted above.  The following changes have been made:  See above.  Labs/ tests ordered today include:   No orders of the defined types were placed in this encounter.    Disposition:   FU with me in 6 months.   Patient is agreeable to this plan and will call if any problems develop in the interim.   SignedTruitt Merle, NP  02/07/2018 2:06 PM  [place] [facility] [address] [place], [place]  [address] Phone: [phone] Fax: [phone]

## 2018-02-07 NOTE — Patient Instructions (Addendum)
We will be checking the following labs today - NONE   Medication Instructions:    Continue with your current medicines.     Testing/Procedures To Be Arranged:  N/A  Follow-Up:   See me in 6 months    Other Special Instructions:   [facility] [phone] - Dr. Paulla Dolly    If you need a refill on your cardiac medications before your next appointment, please call your pharmacy.   Call the [place] office at [phone] if you have any questions, problems or concerns.

## 2018-02-09 ENCOUNTER — Ambulatory Visit (INDEPENDENT_AMBULATORY_CARE_PROVIDER_SITE_OTHER): Payer: Medicare Other

## 2018-02-09 DIAGNOSIS — I5022 Chronic systolic (congestive) heart failure: Secondary | ICD-10-CM | POA: Diagnosis not present

## 2018-02-09 DIAGNOSIS — Z95810 Presence of automatic (implantable) cardiac defibrillator: Secondary | ICD-10-CM

## 2018-02-10 ENCOUNTER — Telehealth: Payer: Self-pay

## 2018-02-10 NOTE — Progress Notes (Signed)
EPIC Encounter for ICM Monitoring  Patient Name: Grace Robinson is a 84 y.o. female Date: 02/10/2018 Primary Care Physican: Shon Baton, MD Primary Cardiologist: Truitt Merle, NP Electrophysiologist: Faustino Congress Weight:140lbs (at office visit 4/9-does not weigh at home) Bi-V Pacing: 98.4%        Attempted call to patient and unable to reach.  Left detailed message regarding transmission.  Transmission reviewed.    Thoracic impedance normal.  Prescribed dosage: Furosemide 1 tablet (20 mg total) by mouth daily alternating (40 mg) every other day.Potassium 10 mEq 3 tablets daily  Labs: 09/29/2017Creatinine 0.81, BUN 17, Potassium 3.9, Sodium 142  Recommendations: Left voice mail with ICM number and encouraged to call if experiencing any fluid symptoms.  Follow-up plan: ICM clinic phone appointment on 03/13/2018.    Copy of ICM check sent to Dr. Caryl Comes.   3 month ICM trend: 02/10/2018    1 Year ICM trend:       [name], RN 02/10/2018 8:52 AM

## 2018-02-10 NOTE — Telephone Encounter (Signed)
Remote ICM transmission received.  Attempted call to patient and left detailed message per DPR regarding transmission and next ICM scheduled for 03/13/2018.  Advised to return call for any fluid symptoms or questions.    

## 2018-02-13 ENCOUNTER — Ambulatory Visit: Payer: Medicare Other | Admitting: Nurse Practitioner

## 2018-02-14 ENCOUNTER — Ambulatory Visit: Payer: Medicare Other | Admitting: Nurse Practitioner

## 2018-03-13 ENCOUNTER — Ambulatory Visit (INDEPENDENT_AMBULATORY_CARE_PROVIDER_SITE_OTHER): Payer: Medicare Other | Admitting: *Deleted

## 2018-03-13 DIAGNOSIS — I5022 Chronic systolic (congestive) heart failure: Secondary | ICD-10-CM

## 2018-03-13 DIAGNOSIS — Z95810 Presence of automatic (implantable) cardiac defibrillator: Secondary | ICD-10-CM | POA: Diagnosis not present

## 2018-03-13 NOTE — Progress Notes (Signed)
EPIC Encounter for ICM Monitoring  Patient Name: Grace Robinson is a 84 y.o. female Date: 03/13/2018 Primary Care Physican: Shon Baton, MD Primary Cardiologist: Truitt Merle, NP Electrophysiologist: Faustino Congress Weight:140lbs (at office visit 4/9-does not weigh at home) Bi-V Pacing: 98.4%        Heart Failure questions reviewed, pt asymptomatic.    Thoracic impedance normal but was abnormal suggesting fluid accumulation from 02/15/2018 - 03/05/2018.  Prescribed dosage: Furosemide 1 tablet (20 mg total) by mouth daily alternating (40 mg) every other day.Potassium 10 mEq 3 tablets daily  Labs: 09/29/2017Creatinine 0.81, BUN 17, Potassium 3.9, Sodium 142  Recommendations: No changes.  Encouraged to call for fluid symptoms.  Follow-up plan: ICM clinic phone appointment on 04/13/2018.    Copy of ICM check sent to Dr. Caryl Comes.   3 month ICM trend: 03/13/2018    1 Year ICM trend:       [name], RN 03/13/2018 11:49 AM

## 2018-03-13 NOTE — Progress Notes (Signed)
Remote ICD transmission.   

## 2018-03-15 ENCOUNTER — Encounter: Payer: Self-pay | Admitting: Cardiology

## 2018-03-15 LAB — CUP PACEART REMOTE DEVICE CHECK
Battery Remaining Longevity: 45 mo
Battery Voltage: 2.97 V
Brady Statistic AS VP Percent: 72.42 %
Brady Statistic RA Percent Paced: 26.49 %
HIGH POWER IMPEDANCE MEASURED VALUE: 49 Ohm
HIGH POWER IMPEDANCE MEASURED VALUE: 67 Ohm
Implantable Lead Implant Date: 20031007
Implantable Lead Implant Date: 20031007
Implantable Lead Implant Date: 20060227
Implantable Lead Location: 753859
Implantable Lead Model: 4193
Implantable Lead Model: 4469
Implantable Lead Model: 4470
Implantable Pulse Generator Implant Date: 20141022
Lead Channel Impedance Value: 456 Ohm
Lead Channel Impedance Value: 475 Ohm
Lead Channel Impedance Value: 475 Ohm
Lead Channel Pacing Threshold Amplitude: 1.125 V
Lead Channel Pacing Threshold Pulse Width: 0.4 ms
Lead Channel Pacing Threshold Pulse Width: 0.4 ms
Lead Channel Sensing Intrinsic Amplitude: 31.25 mV
Lead Channel Sensing Intrinsic Amplitude: 31.25 mV
Lead Channel Setting Pacing Amplitude: 2 V
Lead Channel Setting Pacing Pulse Width: 0.4 ms
Lead Channel Setting Pacing Pulse Width: 0.4 ms
Lead Channel Setting Sensing Sensitivity: 0.6 mV
MDC IDC LEAD IMPLANT DT: 20031007
MDC IDC LEAD LOCATION: 753858
MDC IDC LEAD LOCATION: 753860
MDC IDC LEAD LOCATION: 753860
MDC IDC LEAD SERIAL: 337988
MDC IDC LEAD SERIAL: 361236
MDC IDC MSMT LEADCHNL LV IMPEDANCE VALUE: 4047 Ohm
MDC IDC MSMT LEADCHNL LV IMPEDANCE VALUE: 4047 Ohm
MDC IDC MSMT LEADCHNL LV PACING THRESHOLD AMPLITUDE: 0.875 V
MDC IDC MSMT LEADCHNL RA PACING THRESHOLD AMPLITUDE: 0.75 V
MDC IDC MSMT LEADCHNL RA PACING THRESHOLD PULSEWIDTH: 0.4 ms
MDC IDC MSMT LEADCHNL RA SENSING INTR AMPL: 1.25 mV
MDC IDC MSMT LEADCHNL RA SENSING INTR AMPL: 1.25 mV
MDC IDC MSMT LEADCHNL RV IMPEDANCE VALUE: 361 Ohm
MDC IDC SESS DTM: 20190513052503
MDC IDC SET LEADCHNL RA PACING AMPLITUDE: 2 V
MDC IDC SET LEADCHNL RV PACING AMPLITUDE: 2.5 V
MDC IDC STAT BRADY AP VP PERCENT: 26.17 %
MDC IDC STAT BRADY AP VS PERCENT: 0.35 %
MDC IDC STAT BRADY AS VS PERCENT: 1.06 %
MDC IDC STAT BRADY RV PERCENT PACED: 0.65 %

## 2018-03-22 DIAGNOSIS — D485 Neoplasm of uncertain behavior of skin: Secondary | ICD-10-CM | POA: Diagnosis not present

## 2018-03-22 DIAGNOSIS — Z85828 Personal history of other malignant neoplasm of skin: Secondary | ICD-10-CM | POA: Diagnosis not present

## 2018-03-22 DIAGNOSIS — L738 Other specified follicular disorders: Secondary | ICD-10-CM | POA: Diagnosis not present

## 2018-03-22 DIAGNOSIS — L570 Actinic keratosis: Secondary | ICD-10-CM | POA: Diagnosis not present

## 2018-03-30 DIAGNOSIS — C4492 Squamous cell carcinoma of skin, unspecified: Secondary | ICD-10-CM | POA: Diagnosis not present

## 2018-03-30 DIAGNOSIS — I420 Dilated cardiomyopathy: Secondary | ICD-10-CM | POA: Diagnosis not present

## 2018-03-30 DIAGNOSIS — E1122 Type 2 diabetes mellitus with diabetic chronic kidney disease: Secondary | ICD-10-CM | POA: Diagnosis not present

## 2018-03-30 DIAGNOSIS — G609 Hereditary and idiopathic neuropathy, unspecified: Secondary | ICD-10-CM | POA: Diagnosis not present

## 2018-04-13 ENCOUNTER — Ambulatory Visit (INDEPENDENT_AMBULATORY_CARE_PROVIDER_SITE_OTHER): Payer: Medicare Other

## 2018-04-13 DIAGNOSIS — Z95810 Presence of automatic (implantable) cardiac defibrillator: Secondary | ICD-10-CM | POA: Diagnosis not present

## 2018-04-13 DIAGNOSIS — I5022 Chronic systolic (congestive) heart failure: Secondary | ICD-10-CM

## 2018-04-13 NOTE — Progress Notes (Signed)
EPIC Encounter for ICM Monitoring  Patient Name: Grace Robinson is a 84 y.o. female Date: 04/13/2018 Primary Care Physican: Shon Baton, MD Primary Cardiologist: Truitt Merle, NP Electrophysiologist: Faustino Congress Weight:140lbs (at office visit 4/9-does not weigh at home) Bi-V Pacing: 98.4%      Heart Failure questions reviewed, pt asymptomatic.   Thoracic impedance normal.  Prescribed dosage: Furosemide 1 tablet (20 mg total) by mouth daily alternating (40 mg) every other day.Potassium 10 mEq 3 tablets daily  Labs: 09/29/2017Creatinine 0.81, BUN 17, Potassium 3.9, Sodium 142  Recommendations: No changes.   Encouraged to call for fluid symptoms.  Follow-up plan: ICM clinic phone appointment on 05/15/2018.  Office appointment scheduled 06/12/2018 with Dr. Caryl Comes and 08/08/2018 with Truitt Merle, NP.  Copy of ICM check sent to Dr. Caryl Comes.   3 month ICM trend: 04/13/2018    1 Year ICM trend:       [name], RN 04/13/2018 1:30 PM

## 2018-04-17 DIAGNOSIS — Z794 Long term (current) use of insulin: Secondary | ICD-10-CM | POA: Diagnosis not present

## 2018-04-17 DIAGNOSIS — E119 Type 2 diabetes mellitus without complications: Secondary | ICD-10-CM | POA: Diagnosis not present

## 2018-04-17 DIAGNOSIS — Z961 Presence of intraocular lens: Secondary | ICD-10-CM | POA: Diagnosis not present

## 2018-04-17 DIAGNOSIS — H5203 Hypermetropia, bilateral: Secondary | ICD-10-CM | POA: Diagnosis not present

## 2018-04-19 DIAGNOSIS — E1122 Type 2 diabetes mellitus with diabetic chronic kidney disease: Secondary | ICD-10-CM | POA: Diagnosis not present

## 2018-04-28 DIAGNOSIS — E1151 Type 2 diabetes mellitus with diabetic peripheral angiopathy without gangrene: Secondary | ICD-10-CM | POA: Diagnosis not present

## 2018-04-28 DIAGNOSIS — L84 Corns and callosities: Secondary | ICD-10-CM | POA: Diagnosis not present

## 2018-04-28 DIAGNOSIS — I739 Peripheral vascular disease, unspecified: Secondary | ICD-10-CM | POA: Diagnosis not present

## 2018-04-28 DIAGNOSIS — L603 Nail dystrophy: Secondary | ICD-10-CM | POA: Diagnosis not present

## 2018-05-15 ENCOUNTER — Ambulatory Visit (INDEPENDENT_AMBULATORY_CARE_PROVIDER_SITE_OTHER): Payer: Medicare Other

## 2018-05-15 DIAGNOSIS — Z95810 Presence of automatic (implantable) cardiac defibrillator: Secondary | ICD-10-CM

## 2018-05-15 DIAGNOSIS — I5022 Chronic systolic (congestive) heart failure: Secondary | ICD-10-CM | POA: Diagnosis not present

## 2018-05-15 NOTE — Progress Notes (Signed)
EPIC Encounter for ICM Monitoring  Patient Name: Grace Robinson is a 84 y.o. female Date: 05/15/2018 Primary Care Physican: Shon Baton, MD Primary Cardiologist: Truitt Merle, NP Electrophysiologist: Caryl Comes Dry Weight:140lbs Bi-V Pacing: 98.4%       Heart Failure questions reviewed, pt asymptomatic.   Thoracic impedance normal.  Prescribed dosage: Furosemide 1 tablet (20 mg total) by mouth daily alternating (40 mg) every other day.Potassium 10 mEq 3 tablets daily  Recommendations: No changes.  Encouraged to call for fluid symptoms.  Follow-up plan: ICM clinic phone appointment on 06/15/2018.    Copy of ICM check sent to Dr. Caryl Comes.   3 month ICM trend: 05/15/2018    1 Year ICM trend:       [name], RN 05/15/2018 2:45 PM

## 2018-06-15 ENCOUNTER — Ambulatory Visit (INDEPENDENT_AMBULATORY_CARE_PROVIDER_SITE_OTHER): Payer: Medicare Other | Admitting: *Deleted

## 2018-06-15 ENCOUNTER — Ambulatory Visit (INDEPENDENT_AMBULATORY_CARE_PROVIDER_SITE_OTHER): Payer: Medicare Other

## 2018-06-15 DIAGNOSIS — I428 Other cardiomyopathies: Secondary | ICD-10-CM | POA: Diagnosis not present

## 2018-06-15 DIAGNOSIS — Z95810 Presence of automatic (implantable) cardiac defibrillator: Secondary | ICD-10-CM | POA: Diagnosis not present

## 2018-06-15 DIAGNOSIS — I5022 Chronic systolic (congestive) heart failure: Secondary | ICD-10-CM | POA: Diagnosis not present

## 2018-06-15 NOTE — Progress Notes (Signed)
EPIC Encounter for ICM Monitoring  Patient Name: Grace Robinson is a 84 y.o. female Date: 06/15/2018 Primary Care Physican: Shon Baton, MD Primary Cardiologist: Truitt Merle, NP Electrophysiologist: Caryl Comes Dry Weight:140lbs Bi-V Pacing: 98.3%      Heart Failure questions reviewed, pt reported having in the last 3 weeks she has had episodes of an aching that builds in her chest for a minute and then goes away.  In the same time frame she had a couple of episodes a week of a choking feeling with the heeart beating in her throat followed by lightheadedness that lasts about 30-40 seconds.  She has not had any episodes in the past 5 days. She has had some stress lately and wonders if it can be that.   She does not think it is heart related.  She asked when would her next echocardiogram be done and advised her to discuss with Cecille Rubin at next 08/08/2018 visit.    Thoracic impedance normal.  Prescribed dosage: Furosemide 1 tablet (20 mg total) by mouth daily alternating (40 mg) every other day.Potassium 10 mEq 3 tablets daily  Recommendations: No changes.  Advised if she feels she needs an earlier appointment with Truitt Merle, NP to call the office or use ER if needed.   Encouraged to call for fluid symptoms.  Follow-up plan: ICM clinic phone appointment on 07/17/2018.  Office visit with Truitt Merle, NP 08/08/2018.  Copy of ICM check sent to Dr. Caryl Comes and Truitt Merle, NP.   3 month ICM trend: 06/15/2018    1 Year ICM trend:       [name], RN 06/15/2018 5:16 PM

## 2018-06-15 NOTE — Progress Notes (Signed)
Remote ICD transmission.   

## 2018-06-19 ENCOUNTER — Telehealth: Payer: Self-pay | Admitting: *Deleted

## 2018-06-19 NOTE — Telephone Encounter (Signed)
Pt calling in today regarding earlier message that was left.  Pt is doing fine, watching great grandchildren today, will keep appt in October,  if something changes pt has number and will call.  Will send to [place] to [place].

## 2018-06-19 NOTE — Telephone Encounter (Signed)
-----   Message from Burtis Junes, NP sent at 06/16/2018  3:21 PM EDT ----- Let's check on her and see if we need to move her OV up.Grace Robinson ----- Message ----- From: [name], RN Sent: 06/16/2018   3:18 PM EDT To: Burtis Junes, NP

## 2018-06-19 NOTE — Telephone Encounter (Signed)
Pt calling in today is doing fine watching great grandchildren and will keep appt in October.  Will call if needs sooner appt.

## 2018-06-19 NOTE — Telephone Encounter (Signed)
lvm to see if pt wants a sooner appt.

## 2018-06-22 DIAGNOSIS — L570 Actinic keratosis: Secondary | ICD-10-CM | POA: Diagnosis not present

## 2018-06-22 DIAGNOSIS — Z85828 Personal history of other malignant neoplasm of skin: Secondary | ICD-10-CM | POA: Diagnosis not present

## 2018-06-22 DIAGNOSIS — L821 Other seborrheic keratosis: Secondary | ICD-10-CM | POA: Diagnosis not present

## 2018-06-22 DIAGNOSIS — D485 Neoplasm of uncertain behavior of skin: Secondary | ICD-10-CM | POA: Diagnosis not present

## 2018-07-10 DIAGNOSIS — E1151 Type 2 diabetes mellitus with diabetic peripheral angiopathy without gangrene: Secondary | ICD-10-CM | POA: Diagnosis not present

## 2018-07-10 DIAGNOSIS — I739 Peripheral vascular disease, unspecified: Secondary | ICD-10-CM | POA: Diagnosis not present

## 2018-07-10 DIAGNOSIS — L84 Corns and callosities: Secondary | ICD-10-CM | POA: Diagnosis not present

## 2018-07-10 DIAGNOSIS — L603 Nail dystrophy: Secondary | ICD-10-CM | POA: Diagnosis not present

## 2018-07-17 ENCOUNTER — Telehealth: Payer: Self-pay | Admitting: *Deleted

## 2018-07-17 ENCOUNTER — Ambulatory Visit (INDEPENDENT_AMBULATORY_CARE_PROVIDER_SITE_OTHER): Payer: Medicare Other

## 2018-07-17 DIAGNOSIS — I5022 Chronic systolic (congestive) heart failure: Secondary | ICD-10-CM

## 2018-07-17 DIAGNOSIS — Z794 Long term (current) use of insulin: Secondary | ICD-10-CM | POA: Diagnosis not present

## 2018-07-17 DIAGNOSIS — E1140 Type 2 diabetes mellitus with diabetic neuropathy, unspecified: Secondary | ICD-10-CM | POA: Diagnosis not present

## 2018-07-17 DIAGNOSIS — Z95810 Presence of automatic (implantable) cardiac defibrillator: Secondary | ICD-10-CM | POA: Diagnosis not present

## 2018-07-17 DIAGNOSIS — E1122 Type 2 diabetes mellitus with diabetic chronic kidney disease: Secondary | ICD-10-CM | POA: Diagnosis not present

## 2018-07-17 DIAGNOSIS — R251 Tremor, unspecified: Secondary | ICD-10-CM | POA: Diagnosis not present

## 2018-07-17 NOTE — Telephone Encounter (Signed)
Grace Robinson denies feeling any symptoms at the time of the episode but admits that it was a Grace Robinson time ago and she has trouble remembering that far back. She looked on the calendar and realized that it was the night before she got her new car and she was anxious. I made her aware of [place] driving restrictions x 6 months. I will call her back with any recommendations from Dr. Caryl Comes. She is appreciative.

## 2018-07-17 NOTE — Progress Notes (Signed)
EPIC Encounter for ICM Monitoring  Patient Name: Grace Robinson is a 84 y.o. female Date: 07/17/2018 Primary Care Physican: Shon Baton, MD Primary Cardiologist: Truitt Merle, NP Electrophysiologist: Caryl Comes Dry Weight:140lbs Bi-V Pacing: 98.3%            Heart Failure questions reviewed, pt asymptomatic.   Thoracic impedance normal as far as fluid levels are concerned.  1 episode of pace terminated VT on 06/19/2018 and being addressed by device clinic nurse.   Prescribed: Furosemide 1 tablet (20 mg total) by mouth daily alternating (40 mg) every other day.Potassium 10 mEq 3 tablets daily  Recommendations: No changes.  Encouraged to call for fluid symptoms.  Follow-up plan: ICM clinic phone appointment on 08/17/2018.  Office visit with Truitt Merle, NP 08/08/2018.  Copy of ICM check sent to Dr. Caryl Comes and Truitt Merle, NP.   3 month ICM trend: 07/17/2018    1 Year ICM trend:       [name], RN 07/17/2018 12:17 PM

## 2018-07-17 NOTE — Telephone Encounter (Signed)
ICD remote transmission received this morning. VT with ATP on 06/19/18.   LMOM to return call to [facility].

## 2018-07-24 LAB — CUP PACEART REMOTE DEVICE CHECK
Battery Voltage: 2.97 V
Brady Statistic AP VS Percent: 0.35 %
Brady Statistic AS VP Percent: 71.42 %
Brady Statistic RA Percent Paced: 27.46 %
HIGH POWER IMPEDANCE MEASURED VALUE: 51 Ohm
HIGH POWER IMPEDANCE MEASURED VALUE: 69 Ohm
Implantable Lead Implant Date: 20031007
Implantable Lead Location: 753859
Implantable Lead Model: 4193
Implantable Lead Serial Number: 361236
Implantable Pulse Generator Implant Date: 20141022
Lead Channel Impedance Value: 342 Ohm
Lead Channel Impedance Value: 4047 Ohm
Lead Channel Impedance Value: 456 Ohm
Lead Channel Impedance Value: 475 Ohm
Lead Channel Pacing Threshold Pulse Width: 0.4 ms
Lead Channel Pacing Threshold Pulse Width: 0.4 ms
Lead Channel Pacing Threshold Pulse Width: 0.4 ms
Lead Channel Sensing Intrinsic Amplitude: 30.25 mV
Lead Channel Sensing Intrinsic Amplitude: 30.25 mV
Lead Channel Setting Pacing Amplitude: 1.75 V
Lead Channel Setting Pacing Amplitude: 2 V
Lead Channel Setting Pacing Pulse Width: 0.4 ms
Lead Channel Setting Pacing Pulse Width: 0.4 ms
MDC IDC LEAD IMPLANT DT: 20031007
MDC IDC LEAD IMPLANT DT: 20031007
MDC IDC LEAD IMPLANT DT: 20060227
MDC IDC LEAD LOCATION: 753858
MDC IDC LEAD LOCATION: 753860
MDC IDC LEAD LOCATION: 753860
MDC IDC LEAD SERIAL: 337988
MDC IDC MSMT BATTERY REMAINING LONGEVITY: 39 mo
MDC IDC MSMT LEADCHNL LV IMPEDANCE VALUE: 4047 Ohm
MDC IDC MSMT LEADCHNL LV PACING THRESHOLD AMPLITUDE: 0.75 V
MDC IDC MSMT LEADCHNL RA IMPEDANCE VALUE: 475 Ohm
MDC IDC MSMT LEADCHNL RA PACING THRESHOLD AMPLITUDE: 0.75 V
MDC IDC MSMT LEADCHNL RA SENSING INTR AMPL: 1.25 mV
MDC IDC MSMT LEADCHNL RA SENSING INTR AMPL: 1.25 mV
MDC IDC MSMT LEADCHNL RV PACING THRESHOLD AMPLITUDE: 1.125 V
MDC IDC SESS DTM: 20190815062825
MDC IDC SET LEADCHNL RV PACING AMPLITUDE: 2.5 V
MDC IDC SET LEADCHNL RV SENSING SENSITIVITY: 0.6 mV
MDC IDC STAT BRADY AP VP PERCENT: 27.18 %
MDC IDC STAT BRADY AS VS PERCENT: 1.05 %
MDC IDC STAT BRADY RV PERCENT PACED: 0.32 %

## 2018-07-25 ENCOUNTER — Encounter: Payer: Self-pay | Admitting: Nurse Practitioner

## 2018-08-08 ENCOUNTER — Ambulatory Visit: Payer: Medicare Other | Admitting: Nurse Practitioner

## 2018-08-08 ENCOUNTER — Encounter: Payer: Self-pay | Admitting: Nurse Practitioner

## 2018-08-08 VITALS — BP 148/90 | HR 68 | Ht 66.75 in | Wt 134.9 lb

## 2018-08-08 DIAGNOSIS — I428 Other cardiomyopathies: Secondary | ICD-10-CM

## 2018-08-08 DIAGNOSIS — Z95810 Presence of automatic (implantable) cardiac defibrillator: Secondary | ICD-10-CM | POA: Diagnosis not present

## 2018-08-08 DIAGNOSIS — I10 Essential (primary) hypertension: Secondary | ICD-10-CM

## 2018-08-08 DIAGNOSIS — I5022 Chronic systolic (congestive) heart failure: Secondary | ICD-10-CM

## 2018-08-08 NOTE — Progress Notes (Signed)
CARDIOLOGY OFFICE NOTE  Date:  08/08/2018    Grace Robinson Date of Birth: [birth year] Medical Record #[id number]  PCP:  Shon Baton, MD  Cardiologist:  [facility]Hochrein  Chief Complaint  Patient presents with  . Cardiomyopathy    Seen for Dr. Caryl Comes & Hochrein    History of Present Illness: Grace Robinson is a 84 y.o. female who presents today for a follow up visit. Seen for Dr. Percival Spanish and Dr. Caryl Comes. She is a former patient of Dr. Susa Simmonds. Primarily follows with me.   She has an nonischemic CM with remote EF down to 25% that has improved with aggressive medical therapy and CRT-D. Last generator change in 2014. Other issues include HLD, IDDM, ulcerative colitis, LBBB, Meniere's disease and advanced age. Last echo back in 07/2014 showed her EF had normalized at 50 to 55%.   I have seen her over the past several years. She has basically done pretty well from our standpoint. Some occasional issues with swelling/elevated BP but for the most part has done ok. I sent her to see [name] with GI who helped her tremendously with her diarrhea.   Last seen by me back in April and she was doing well.    Comes in today. Here alone. She turned 89 last month. . She did have a spell of VT which was treated with ATP appropriately. This was back in August. She does not remember anything out of the ordinary.  She has had no chest pain. Breathing is fine. She is not interested in having any testing to see if EF has changed. She feels like she is ok. She was told about no driving for 6 months - this was reiterated again today. Her balance is her main issue. No recent falls. She feels like she is doing ok. Has her physical in a few weeks coming up.   Past Medical History:  Diagnosis Date  . Biventricular ICD (implantable cardiac defibrillator) in place   . Cellulitis   . Change in bowel habits   . CHF (congestive heart failure) ([place])   . Colon polyps   . Diabetes mellitus    Type II  .  Diarrhea   . Dyslipidemia   . HOH (hard of hearing)    left  . HTN (hypertension)   . Ischemic cardiomyopathy   . LBBB (left bundle branch block)    chronic  . Meniere's disease   . T wave oversensing 01/17/2014  . Ulcerative colitis   . Varicose veins    4 lazer  treatment each leg    Past Surgical History:  Procedure Laterality Date  . ABDOMINAL HYSTERECTOMY    . CARDIAC CATHETERIZATION  08/2011  . CHOLECYSTECTOMY    . COLONOSCOPY    . EP Study  08/22/08  . IMPLANTABLE CARDIOVERTER DEFIBRILLATOR GENERATOR CHANGE N/A 08/22/2013   Procedure: IMPLANTABLE CARDIOVERTER DEFIBRILLATOR GENERATOR CHANGE;  Surgeon: Deboraha Sprang, MD;  Location: [facility] CATH LAB;  Service: Cardiovascular;  Laterality: N/A;  . KNEE ARTHROSCOPY Left 04/14/2016   Procedure: LEFT KNEE ARTHROSCOPY, PARTIAL MEDIAL AND PARTIAL LATERAL MENISCECTOMY, MEDIAL PLICA;  Surgeon: Dorna Leitz, MD;  Location: [facility];  Service: Orthopedics;  Laterality: Left;  . mastoid sugery     in the setting of Meniere's disease  . TONSILLECTOMY AND ADENOIDECTOMY       Medications: Current Meds  Medication Sig  . acetaminophen (TYLENOL) 650 MG CR tablet Take 650 mg by mouth as needed for pain.  Marland Kitchen  aspirin EC 81 MG tablet Take 1 tablet (81 mg total) by mouth daily.  Marland Kitchen atorvastatin (LIPITOR) 40 MG tablet Take 40 mg by mouth daily.  . cholestyramine (QUESTRAN) 4 g packet Take 1 dose daily, may take a second dose daily as needed.  . dicyclomine (BENTYL) 10 MG capsule Take 1 tab 30 min before meals  . furosemide (LASIX) 20 MG tablet Take 1 tablet (20 mg total) by mouth daily. alternating (40 mg) every other day.  Marland Kitchen HUMALOG MIX 75/25 (75-25) 100 UNIT/ML SUSP injection Inject 22 Units into the skin 2 (two) times daily with a meal.   . metoprolol succinate (TOPROL-XL) 100 MG 24 hr tablet Take 1 tablet by mouth 2 (two) times daily.  Marland Kitchen olmesartan (BENICAR) 40 MG tablet Take 40 mg by mouth daily.  . potassium chloride (K-DUR) 10 MEQ tablet Take 10  mEq by mouth daily.  . [DISCONTINUED] Ascorbic Acid (VITAMIN C) 100 MG tablet Take 100 mg by mouth daily.  . [DISCONTINUED] cholecalciferol (VITAMIN D) 1000 units tablet Take 2,500 Units daily by mouth.  . [DISCONTINUED] Probiotic Product (PROBIOTIC-10 PO) Take daily by mouth.  . [DISCONTINUED] sitaGLIPtin (JANUVIA) 100 MG tablet Take 100 mg by mouth daily.  . [DISCONTINUED] vitamin B-12 (CYANOCOBALAMIN) 1000 MCG tablet Take 1,000 mcg daily by mouth.  . [DISCONTINUED] vitamin E 100 UNIT capsule Take daily by mouth.     Allergies: Allergies  Allergen Reactions  . Carvedilol Other (See Comments)  . Vancomycin Other (See Comments)  . Sudafed [Pseudoephedrine] Other (See Comments)    Makes skin feel like its crawling    Social History: The patient  reports that she has quit smoking. She quit after 1.00 year of use. She has never used smokeless tobacco. She reports that she does not drink alcohol or use drugs.   Family History: The patient's family history includes Breast cancer in her mother; Emphysema (age of onset: 66) in her father; Heart disease in her father; Lung cancer in her mother.   Review of Systems: Please see the history of present illness.   Otherwise, the review of systems is positive for none.   All other systems are reviewed and negative.   Physical Exam: VS:  BP (!) 148/90   Pulse 68   Ht 5' 6.75" (1.695 m)   Wt 134 lb 14.2 oz (61.2 kg)   SpO2 98%   BMI 21.29 kg/m  .  BMI Body mass index is 21.29 kg/m.  Wt Readings from Last 3 Encounters:  08/08/18 134 lb 14.2 oz (61.2 kg)  02/07/18 140 lb 12.8 oz (63.9 kg)  12/09/17 128 lb (58.1 kg)    General: Pleasant. She looks younger than her stated age. She is alert and in no acute distress.   HEENT: Normal.  Neck: Supple, no JVD, carotid bruits, or masses noted.  Cardiac: Regular rate and rhythm. No murmurs, rubs, or gallops. No edema.  Respiratory:  Lungs are clear to auscultation bilaterally with normal work of  breathing.  GI: Soft and nontender.  MS: No deformity or atrophy. Gait and ROM intact.  Skin: Warm and dry. Color is normal.  Neuro:  Strength and sensation are intact and no gross focal deficits noted.  Psych: Alert, appropriate and with normal affect.   LABORATORY DATA:  EKG:  EKG is not ordered today.  Lab Results  Component Value Date   WBC 5.8 09/06/2016   HGB 13.6 09/06/2016   HCT 39.7 09/06/2016   PLT 220.0 09/06/2016  GLUCOSE 237 (H) 07/30/2016   ALT 19 07/30/2016   AST 22 07/30/2016   NA 142 07/30/2016   K 3.9 07/30/2016   CL 103 07/30/2016   CREATININE 0.81 07/30/2016   BUN 17 07/30/2016   CO2 27 07/30/2016   TSH 1.23 07/20/2011   INR 1.1 (H) 08/13/2013       BNP (last 3 results) No results for input(s): BNP in the last 8760 hours.  ProBNP (last 3 results) No results for input(s): PROBNP in the last 8760 hours.   Other Studies Reviewed Today:  Echo Study Conclusions from 07/2014  - Left ventricle: The cavity size was normal. Systolic function was normal. The estimated ejection fraction was in the range of 50% to 55%. There was an increased relative contribution of atrial contraction to ventricular filling. Doppler parameters are consistent with abnormal left ventricular relaxation (grade 1 diastolic dysfunction). - Ventricular septum: Septal motion showed paradox. These changes are consistent with right ventricular pacing. - Aortic valve: There was mild regurgitation. - Mitral valve: There is a calcified mobile density in the LV cavity most consistent with calcified redundant chordae tendinae. This was present on echo in 2014. There was mild regurgitation. - Pulmonic valve: There was trivial regurgitation.  Assessment / Plan:  1. NICM - she had recovery of her EF per echo back in 2015. She has not wished to have repeated. She is felt to be NYHA II/III at this time. She is happy with how she is doing. No changes made today.   2.  Underlying CRT-D - had VT back in August with appropriate ATP. [place] lab today. She does not wish for further testing at this time.   3. HTN - BP higher here today - she has much better control at home. She will continue to monitor.   4. Advanced age -still does surprisingly well. She has put her name on the [facility] at [facility].   Current medicines are reviewed with the patient today.  The patient does not have concerns regarding medicines other than what has been noted above.  The following changes have been made:  See above.  Labs/ tests ordered today include:    Orders Placed This Encounter  Procedures  . Basic metabolic panel  . Magnesium  . CBC     Disposition:   FU with me in April - she is seeing Dr. Caryl Comes in February of 2020.    Patient is agreeable to this plan and will call if any problems develop in the interim.   SignedTruitt Merle, NP  08/08/2018 2:23 PM  [facility] [address] [place], [place]  [address] Phone: [phone] Fax: [phone]

## 2018-08-08 NOTE — Patient Instructions (Signed)
We will be checking the following labs today - BMET, CBC, Mg  If you have labs (blood work) drawn today and your tests are completely normal, you will receive your results only by: Marland Kitchen MyChart Message (if you have MyChart) OR . A paper copy in the mail If you have any lab test that is abnormal or we need to change your treatment, we will call you to review the results.   Medication Instructions:    Continue with your current medicines.    If you need a refill on your cardiac medications before your next appointment, please call your pharmacy.     Testing/Procedures To Be Arranged:  N/A  Follow-Up:   See me in April  See Dr. Caryl Comes in February    At [facility], you and your health needs are our priority.  As part of our continuing mission to provide you with exceptional heart care, we have created designated Provider Care Teams.  These Care Teams include your primary Cardiologist (physician) and Advanced Practice Providers (APPs -  Physician Assistants and Nurse Practitioners) who all work together to provide you with the care you need, when you need it.  Special Instructions:  . None  Call the [facility] office at [phone] if you have any questions, problems or concerns.

## 2018-08-09 LAB — BASIC METABOLIC PANEL WITH GFR
BUN/Creatinine Ratio: 16 (ref 12–28)
BUN: 13 mg/dL (ref 10–36)
CO2: 22 mmol/L (ref 20–29)
Calcium: 9.4 mg/dL (ref 8.7–10.3)
Chloride: 106 mmol/L (ref 96–106)
Creatinine, Ser: 0.83 mg/dL (ref 0.57–1.00)
GFR calc Af Amer: 72 mL/min/1.73
GFR calc non Af Amer: 62 mL/min/1.73
Glucose: 112 mg/dL — ABNORMAL HIGH (ref 65–99)
Potassium: 3.8 mmol/L (ref 3.5–5.2)
Sodium: 146 mmol/L — ABNORMAL HIGH (ref 134–144)

## 2018-08-09 LAB — CBC
Hematocrit: 39 % (ref 34.0–46.6)
Hemoglobin: 13.2 g/dL (ref 11.1–15.9)
MCH: 31 pg (ref 26.6–33.0)
MCHC: 33.8 g/dL (ref 31.5–35.7)
MCV: 92 fL (ref 79–97)
Platelets: 152 10*3/uL (ref 150–450)
RBC: 4.26 x10E6/uL (ref 3.77–5.28)
RDW: 13.2 % (ref 12.3–15.4)
WBC: 6.3 10*3/uL (ref 3.4–10.8)

## 2018-08-09 LAB — MAGNESIUM: Magnesium: 2.1 mg/dL (ref 1.6–2.3)

## 2018-08-17 ENCOUNTER — Ambulatory Visit (INDEPENDENT_AMBULATORY_CARE_PROVIDER_SITE_OTHER): Payer: Medicare Other

## 2018-08-17 DIAGNOSIS — I5022 Chronic systolic (congestive) heart failure: Secondary | ICD-10-CM | POA: Diagnosis not present

## 2018-08-17 DIAGNOSIS — Z95810 Presence of automatic (implantable) cardiac defibrillator: Secondary | ICD-10-CM | POA: Diagnosis not present

## 2018-08-17 NOTE — Progress Notes (Signed)
EPIC Encounter for ICM Monitoring  Patient Name: SCHERRIE SENECA is a 84 y.o. female Date: 08/17/2018 Primary Care Physican: Shon Baton, MD Primary Cardiologist: Truitt Merle, NP Electrophysiologist: Caryl Comes Dry Weight:132lbs Bi-V Pacing: 98.1%      Heart Failure questions reviewed, pt asymptomatic.  She reported she has probably been drinking more fluids including low sodium vegetable juice.  Advised regular vegetable juice has a lot of sodium.     Thoracic impedance trending back to baseline.   Prescribed: Furosemide 1 tablet (20 mg total) by mouth daily alternating (40 mg) every other day.Potassium 10 mEq 3 tablets daily  Recommendations: No changes.    Encouraged to call for fluid symptoms.  Follow-up plan: ICM clinic phone appointment on 09/18/2018.     Copy of ICM check sent to Dr. Caryl Comes.   3 month ICM trend: 08/17/2018    1 Year ICM trend:       [name], RN 08/17/2018 4:53 PM

## 2018-08-29 NOTE — Progress Notes (Addendum)
Grace Robinson was seen today in the movement disorders clinic for neurologic c onsultation at the request of Shon Baton, MD.  The consultation is for the evaluation of tremor.  The records that were made available to me were reviewed.  Tremor: Yes.     How long has it been going on? 1 year - 1.5 years  At rest or with activation?  Mostly at rest, with hand half way between pronation and supination.  Only in the L hand.  It has gotten a little more frequent but not a lot.  Only may happen one time every 2 days  Fam hx of tremor?  No.  Affected by caffeine:  unknown (1 cup tea per day; some caffeineted soda)  Affected by alcohol:  Does not drink alcohol  Affected by stress:  Yes.    Spills soup if on spoon:  No., she is R hand dominant  Spills glass of liquid if full:  No.  Specific Symptoms:  Voice: more "gravely" Sleep: sleeps well  Vivid Dreams:  No.  Acting out dreams:  No. Wet Pillows: No. Postural symptoms:  Yes.  , relates some to menieres  Falls?  No. Bradykinesia symptoms: difficulty getting out of a chair Loss of smell:  No. Loss of taste:  No. Urinary Incontinence:  No. but has urinary frequency Difficulty Swallowing:  No. Handwriting, micrographia: No. Depression:  No. Memory changes:  No. Hallucinations:  No.  visual distortions: No. N/V:  No. but does have chronic diarrhea with IBS Lightheaded:  No.  Syncope: No. Diplopia:  No. Dyskinesia:  No.  Neuroimaging of the brain has not previously been performed in the recent years.  She had a CT brain in 2004.  It was normal  08/30/18 update: Patient is seen today in follow-up for essential tremor.  I have not seen her in almost a year.  Pt states that tremor is increasing, now both L and R hand.  Trouble getting backs on the earrings.  Cannot button buttons because of the tremor.  Able to eat soup.  Shakes more in AM than in the evening.  Shakes more with activation than at rest.  No coffee but drinks some tea/soda  and that doesn't change tremor.  Doesn't drink EtOH much.     ALLERGIES:   Allergies  Allergen Reactions  . Carvedilol Other (See Comments)  . Vancomycin Other (See Comments)  . Sudafed [Pseudoephedrine] Other (See Comments)    Makes skin feel like its crawling    CURRENT MEDICATIONS:  Outpatient Encounter Medications as of 08/30/2018  Medication Sig  . acetaminophen (TYLENOL) 650 MG CR tablet Take 650 mg by mouth as needed for pain.  Marland Kitchen aspirin EC 81 MG tablet Take 1 tablet (81 mg total) by mouth daily.  Marland Kitchen atorvastatin (LIPITOR) 40 MG tablet Take 40 mg by mouth daily.  . cholestyramine (QUESTRAN) 4 g packet Take 1 dose daily, may take a second dose daily as needed.  . dicyclomine (BENTYL) 10 MG capsule Take 1 tab 30 min before meals  . furosemide (LASIX) 20 MG tablet Take 1 tablet (20 mg total) by mouth daily. alternating (40 mg) every other day.  Marland Kitchen HUMALOG MIX 75/25 (75-25) 100 UNIT/ML SUSP injection Inject 22 Units into the skin 2 (two) times daily with a meal.   . metoprolol succinate (TOPROL-XL) 100 MG 24 hr tablet Take 1 tablet by mouth 2 (two) times daily.  Marland Kitchen olmesartan (BENICAR) 40 MG tablet Take 40 mg by  mouth daily.  . potassium chloride (K-DUR) 10 MEQ tablet Take 10 mEq by mouth daily.  . primidone (MYSOLINE) 50 MG tablet Take 1 tablet (50 mg total) by mouth at bedtime.   No facility-administered encounter medications on file as of 08/30/2018.     PAST MEDICAL HISTORY:   Past Medical History:  Diagnosis Date  . Biventricular ICD (implantable cardiac defibrillator) in place   . Cellulitis   . Change in bowel habits   . CHF (congestive heart failure) ([place])   . Colon polyps   . Diabetes mellitus    Type II  . Diarrhea   . Dyslipidemia   . HOH (hard of hearing)    left  . HTN (hypertension)   . Ischemic cardiomyopathy   . LBBB (left bundle branch block)    chronic  . Meniere's disease   . T wave oversensing 01/17/2014  . Ulcerative colitis   . Varicose veins      4 lazer  treatment each leg    PAST SURGICAL HISTORY:   Past Surgical History:  Procedure Laterality Date  . ABDOMINAL HYSTERECTOMY    . CARDIAC CATHETERIZATION  08/2011  . CHOLECYSTECTOMY    . COLONOSCOPY    . EP Study  08/22/08  . IMPLANTABLE CARDIOVERTER DEFIBRILLATOR GENERATOR CHANGE N/A 08/22/2013   Procedure: IMPLANTABLE CARDIOVERTER DEFIBRILLATOR GENERATOR CHANGE;  Surgeon: Deboraha Sprang, MD;  Location: [facility] CATH LAB;  Service: Cardiovascular;  Laterality: N/A;  . KNEE ARTHROSCOPY Left 04/14/2016   Procedure: LEFT KNEE ARTHROSCOPY, PARTIAL MEDIAL AND PARTIAL LATERAL MENISCECTOMY, MEDIAL PLICA;  Surgeon: Dorna Leitz, MD;  Location: [place];  Service: Orthopedics;  Laterality: Left;  . mastoid sugery     in the setting of Meniere's disease  . TONSILLECTOMY AND ADENOIDECTOMY      SOCIAL HISTORY:   Social History   Socioeconomic History  . Marital status: Single    Spouse name: Not on file  . Number of children: 2  . Years of education: Not on file  . Highest education level: Not on file  Occupational History  . 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 Financial resource strain: Not on file  . Food insecurity:    Worry: Not on file    Inability: Not on file  . Transportation needs:    Medical: Not on file    Non-medical: Not on file  Tobacco Use  . Smoking status: Former Smoker    Years: 1.00  . Smokeless tobacco: Never Used  . Tobacco comment: quit in 1970  Substance and Sexual Activity  . Alcohol use: No  . Drug use: No  . Sexual activity: Not Currently    Birth control/protection: Post-menopausal  Lifestyle  . Physical activity:    Days per week: Not on file    Minutes per session: Not on file  . Stress: Not on file  Relationships  . Social connections:    Talks on phone: Not on file    Gets together: Not on file    Attends religious service: Not on file    Active member of club or organization: Not on file    Attends  meetings of clubs or organizations: Not on file    Relationship status: Not on file  . Intimate partner violence:    Fear of current or ex partner: Not on file    Emotionally abused: Not on file    Physically abused: Not on file  Forced sexual activity: Not on file  Other Topics Concern  . Not on file  Social History Narrative   Widowed, has 2 children.     FAMILY HISTORY:   Family Status  Relation Name Status  . Father  Deceased at age 35       emphysema  . Mother  Deceased at age 2       CANCER  . Child x2 Alive  . Neg Hx  (Not Specified)    ROS:  Review of Systems  Constitutional: Negative.   HENT: Negative.   Eyes: Negative.   Respiratory: Negative.   Cardiovascular: Negative.   Gastrointestinal: Negative.   Genitourinary: Negative.   Musculoskeletal: Negative.   Skin: Negative.      PHYSICAL EXAMINATION:    VITALS:   Vitals:   08/30/18 1057  BP: (!) 144/80  Pulse: 86  SpO2: 98%  Weight: 134 lb (60.8 kg)  Height: 5\' 6"  (1.676 m)    GEN:  The patient appears stated age and is in NAD. HEENT:  Normocephalic, atraumatic.  The mucous membranes are moist. The superficial temporal arteries are without ropiness or tenderness. CV:  RRR Lungs:  CTAB Neck/HEME:  There are no carotid bruits bilaterally.  Neurological examination:  Orientation: The patient is alert and oriented x3. Cranial nerves: There is good facial symmetry. The speech is fluent and clear. Soft palate rises symmetrically and there is no tongue deviation. Hearing is intact to conversational tone. Sensation: Sensation is intact to light touch throughout Motor: Strength is 5/5 in the bilateral upper and lower extremities.   Shoulder shrug is equal and symmetric.  There is no pronator drift.  Movement examination: Tone: There is normal tone in the upper and lower extremities. Abnormal movements: No rest tremor.  No significant postural tremor.  She does have some intention tremor, slightly  worse when given a weight.  No significant trouble with Archimedes spirals.  Some mild trouble when pouring water from one glass to another. Coordination: She is slow with all forms of rapid alternating movements, but no significant decremation.   Gait and Station: The patient has no trouble arising out of the chair without the use of her hands.  She has just slightly wide-based, but otherwise stride length is good.    Chemistry      Component Value Date/Time   NA 146 (H) 08/08/2018 1435   K 3.8 08/08/2018 1435   CL 106 08/08/2018 1435   CO2 22 08/08/2018 1435   BUN 13 08/08/2018 1435   CREATININE 0.83 08/08/2018 1435   CREATININE 0.81 07/30/2016 0841      Component Value Date/Time   CALCIUM 9.4 08/08/2018 1435   ALKPHOS 73 07/30/2016 0841   AST 22 07/30/2016 0841   ALT 19 07/30/2016 0841   BILITOT 0.8 07/30/2016 0841     Lab Results  Component Value Date   TSH 1.23 07/20/2011   Lab Results  Component Value Date   WBC 6.3 08/08/2018   HGB 13.2 08/08/2018   HCT 39.0 08/08/2018   MCV 92 08/08/2018   PLT 152 08/08/2018   Addendum labs: Labs are received.  Labs were dated November 10, 2017.  Sodium was 141, potassium 4.0, chloride 108, CO2 26, BUN 18, creatinine 1.0.  White blood cells were 4.9, hemoglobin 12.7, hematocrit 38.4 and platelets 115.  TSH was 1.38  ASSESSMENT/PLAN:  1.   Essential Tremor.  -I reassured her again that she does not meet criteria for Parkinson's disease.  I  still think she has asymmetric essential tremor.  She would like to try medication.  Started her on low-dose primidone.  Discussed extensively risks, benefits, and side effects.  She asked about nature and pathophysiology and discussed this.  Gave her patient literature as well.  Discussed safety.    -We will try to get a copy of lab work including TSH from primary care.    -We will see the patient back in 4 months, sooner should new neurologic issues arise.  Much greater than 50% of this visit was  spent in counseling and coordinating care.  Total face to face time:  25 min

## 2018-08-30 ENCOUNTER — Other Ambulatory Visit: Payer: Self-pay

## 2018-08-30 ENCOUNTER — Encounter: Payer: Self-pay | Admitting: Neurology

## 2018-08-30 ENCOUNTER — Ambulatory Visit: Payer: Medicare Other | Admitting: Neurology

## 2018-08-30 VITALS — BP 144/80 | HR 86 | Ht 66.0 in | Wt 134.0 lb

## 2018-08-30 DIAGNOSIS — G250 Essential tremor: Secondary | ICD-10-CM | POA: Diagnosis not present

## 2018-08-30 MED ORDER — PRIMIDONE 50 MG PO TABS: 50.0000 mg | ORAL_TABLET | Freq: Every day | ORAL | 1 refills | Status: DC

## 2018-08-30 NOTE — Patient Instructions (Addendum)
Your diagnosis is essential tremor  Start primidone - 50 mg - 1/2 tablet at night for 4 nights and then take 1 tablet at night thereafter

## 2018-09-18 ENCOUNTER — Ambulatory Visit (INDEPENDENT_AMBULATORY_CARE_PROVIDER_SITE_OTHER): Payer: Medicare Other

## 2018-09-18 DIAGNOSIS — Z95810 Presence of automatic (implantable) cardiac defibrillator: Secondary | ICD-10-CM

## 2018-09-18 DIAGNOSIS — I5022 Chronic systolic (congestive) heart failure: Secondary | ICD-10-CM | POA: Diagnosis not present

## 2018-09-18 NOTE — Progress Notes (Signed)
EPIC Encounter for ICM Monitoring  Patient Name: Grace Robinson is a 84 y.o. female Date: 09/18/2018 Primary Care Physican: Shon Baton, MD Primary Cardiologist: Truitt Merle, NP Electrophysiologist: Vergie Living Pacing: 98.3%  Last Weight: 132lbs       Heart Failure questions reviewed, pt asymptomatic.  Leaving December 26th to go to [place] with her family for 10 days.    Thoracic impedance normal.   Prescribed: Furosemide 1 tablet (20 mg total) by mouth daily alternating (40 mg) every other day.Potassium 10 mEq 3 tablets daily  Recommendations: No changes.  Encouraged to call for fluid symptoms.  Follow-up plan: ICM clinic phone appointment on 10/19/2018.       Copy of ICM check sent to Dr. Caryl Comes.   3 month ICM trend: 09/18/2018    1 Year ICM trend:       Grace Billings, RN 09/18/2018 12:31 PM

## 2018-10-06 ENCOUNTER — Telehealth: Payer: Self-pay | Admitting: Physician Assistant

## 2018-10-06 ENCOUNTER — Telehealth: Payer: Self-pay | Admitting: Neurology

## 2018-10-06 NOTE — Telephone Encounter (Signed)
I will call patient in a couple weeks and see how she is doing, see if symptoms resolved, and confirm if she wants to keep follow up appt.

## 2018-10-06 NOTE — Telephone Encounter (Signed)
Okay.  She can cancel March f/u if she doesn't want to consider further tx.  I'm sorry she didn't call before this if she felt so bad!

## 2018-10-06 NOTE — Telephone Encounter (Signed)
Patient is calling in about primidone issues. She said that she cant hardly walk, balance is not good, upset stomach and is very sleepy. She wants to know how to get off the med. Please call her back at [phone]. Thanks!

## 2018-10-06 NOTE — Telephone Encounter (Signed)
Patient is instructed. She will let us know if she fails to improve.

## 2018-10-06 NOTE — Telephone Encounter (Signed)
Spoke with patient and she states symptoms started right after starting on medication in October. She kept thinking that it would get better, but it hasn't. She feels unsteady, nauseated, and sleepy. She is only taking Primidone 50 mg - one tablet at bedtime. I advised patient she can go ahead and stop medication. She was worried about seizures, but I assured her that was for large doses. She is not interested in starting anything else for tremors at this time. She is going to make sure these symptoms go away. She expressed appreciation.  Dr. Carles Collet Juluis Rainier.

## 2018-10-06 NOTE — Telephone Encounter (Signed)
Yes , she can take Imodium - take 2 now , then if needed , can take one this evening. If diarrhea continuing tomorrow - take 2 in am , then one q 6 hours as needed. Her episodes generally last a couple days.'

## 2018-10-06 NOTE — Telephone Encounter (Signed)
Patient calls today with concerns about recurrent diarrhea. She takes once daily Questran and 3 times daily Bently. She has had 2 water stools today. Diarrhea started yesterday. She is only eating starchy foods. She asks if she can take Imodium and when she should take it. She wants [name] to advise her. Thanks

## 2018-10-09 DIAGNOSIS — I739 Peripheral vascular disease, unspecified: Secondary | ICD-10-CM | POA: Diagnosis not present

## 2018-10-09 DIAGNOSIS — L603 Nail dystrophy: Secondary | ICD-10-CM | POA: Diagnosis not present

## 2018-10-09 DIAGNOSIS — L84 Corns and callosities: Secondary | ICD-10-CM | POA: Diagnosis not present

## 2018-10-09 DIAGNOSIS — E1151 Type 2 diabetes mellitus with diabetic peripheral angiopathy without gangrene: Secondary | ICD-10-CM | POA: Diagnosis not present

## 2018-10-10 ENCOUNTER — Other Ambulatory Visit: Payer: Self-pay | Admitting: *Deleted

## 2018-10-10 ENCOUNTER — Telehealth: Payer: Self-pay | Admitting: Physician Assistant

## 2018-10-10 MED ORDER — DICYCLOMINE HCL 10 MG PO CAPS: ORAL_CAPSULE | ORAL | 3 refills | Status: DC

## 2018-10-10 NOTE — Telephone Encounter (Signed)
Sent the Bentyl 10 mg to [facility].

## 2018-10-10 NOTE — Telephone Encounter (Signed)
Patient wants refills for dicyclomine sent to to [facility]

## 2018-10-17 ENCOUNTER — Telehealth: Payer: Self-pay | Admitting: Neurology

## 2018-10-17 NOTE — Telephone Encounter (Signed)
Spoke with patient. She states that when she called a couple weeks ago with multiple side effects from Primidone (upset stomach, balance issues, sleepiness) that she had a cold and sore throat and that her symptoms were elevated. She states all she feels is a little sleepiness on medication, but since stopping her tremors are excessively worse. She is asking to restart Primidone. She was on 50 mg at bedtime. I spoke about how miserable she states she was on medication, but she is insistent that she wants to be on medication again. Please advise.

## 2018-10-17 NOTE — Telephone Encounter (Signed)
Just 25 mg and hold it there this time and see how she does.  Make sure still has a f/u

## 2018-10-17 NOTE — Telephone Encounter (Signed)
Patient made aware. She will keep follow up in March.

## 2018-10-17 NOTE — Telephone Encounter (Signed)
Patient is calling in needing to speak with you about getting back on the primidone medication. Please call her back at [phone]. Thanks!

## 2018-10-19 ENCOUNTER — Ambulatory Visit (INDEPENDENT_AMBULATORY_CARE_PROVIDER_SITE_OTHER): Payer: Medicare Other

## 2018-10-19 DIAGNOSIS — I5022 Chronic systolic (congestive) heart failure: Secondary | ICD-10-CM | POA: Diagnosis not present

## 2018-10-19 DIAGNOSIS — Z95810 Presence of automatic (implantable) cardiac defibrillator: Secondary | ICD-10-CM | POA: Diagnosis not present

## 2018-10-20 NOTE — Progress Notes (Signed)
EPIC Encounter for ICM Monitoring  Patient Name: Grace Robinson is a 84 y.o. female Date: 10/20/2018 Primary Care Physican: Shon Baton, MD Primary Cardiologist: Truitt Merle, NP Electrophysiologist: Vergie Living Pacing: 98%  Last Weight: 132lbs                                                   Transmission reviewed Leaving December 26th to go to [facility] with her family for 10 days.    Thoracic impedance normal.   Prescribed: Furosemide 1 tablet (20 mg total) by mouth daily alternating (40 mg) every other day.Potassium 10 mEq 3 tablets daily  Recommendations: None  Follow-up plan: ICM clinic phone appointment on 11/23/2018.       Copy of ICM check sent to Dr. Caryl Comes.   3 month ICM trend: 10/19/2018    1 Year ICM trend:       [name], RN 10/20/2018 5:34 PM

## 2018-11-17 DIAGNOSIS — I10 Essential (primary) hypertension: Secondary | ICD-10-CM | POA: Diagnosis not present

## 2018-11-17 DIAGNOSIS — R82998 Other abnormal findings in urine: Secondary | ICD-10-CM | POA: Diagnosis not present

## 2018-11-17 DIAGNOSIS — E1122 Type 2 diabetes mellitus with diabetic chronic kidney disease: Secondary | ICD-10-CM | POA: Diagnosis not present

## 2018-11-17 DIAGNOSIS — M859 Disorder of bone density and structure, unspecified: Secondary | ICD-10-CM | POA: Diagnosis not present

## 2018-11-23 ENCOUNTER — Ambulatory Visit (INDEPENDENT_AMBULATORY_CARE_PROVIDER_SITE_OTHER): Payer: Medicare Other

## 2018-11-23 DIAGNOSIS — Z95810 Presence of automatic (implantable) cardiac defibrillator: Secondary | ICD-10-CM

## 2018-11-23 DIAGNOSIS — I5022 Chronic systolic (congestive) heart failure: Secondary | ICD-10-CM | POA: Diagnosis not present

## 2018-11-23 NOTE — Progress Notes (Signed)
EPIC Encounter for ICM Monitoring  Patient Name: Grace Robinson is a 85 y.o. female Date: 11/23/2018 Primary Care Physican: Shon Baton, MD Primary Cardiologist: Truitt Merle, NP Electrophysiologist: Vergie Living Pacing: 97.9% Last Weight:132lbs  Heart failure questions reviewed and she feels fine at this time.   Thoracic impedance normal but was abnormal from 11/07/2018 to 11/16/2018.   Prescribed:Furosemide 1 tablet (20 mg total) by mouth daily alternating (40 mg) every other day.Potassium 10 mEq 3 tablets daily  Recommendations: No changes.   Encouraged to call for fluid symptoms.  Follow-up plan: ICM clinic phone appointment on 12/26/2018.    Copy of ICM check sent to Dr. Caryl Comes.   3 month ICM trend: 11/23/2018    1 Year ICM trend:       [name], RN 11/23/2018 4:33 PM

## 2018-11-24 DIAGNOSIS — Z0000 Encounter for general adult medical examination without abnormal findings: Secondary | ICD-10-CM | POA: Diagnosis not present

## 2018-11-24 DIAGNOSIS — E1122 Type 2 diabetes mellitus with diabetic chronic kidney disease: Secondary | ICD-10-CM | POA: Diagnosis not present

## 2018-11-24 DIAGNOSIS — E1140 Type 2 diabetes mellitus with diabetic neuropathy, unspecified: Secondary | ICD-10-CM | POA: Diagnosis not present

## 2018-11-24 DIAGNOSIS — N182 Chronic kidney disease, stage 2 (mild): Secondary | ICD-10-CM | POA: Diagnosis not present

## 2018-12-18 ENCOUNTER — Ambulatory Visit: Payer: Medicare Other | Admitting: Nurse Practitioner

## 2018-12-18 ENCOUNTER — Ambulatory Visit: Payer: Medicare Other | Admitting: Physician Assistant

## 2018-12-18 ENCOUNTER — Encounter: Payer: Self-pay | Admitting: Physician Assistant

## 2018-12-18 VITALS — BP 170/70 | HR 74 | Ht 65.0 in | Wt 130.0 lb

## 2018-12-18 DIAGNOSIS — K580 Irritable bowel syndrome with diarrhea: Secondary | ICD-10-CM | POA: Diagnosis not present

## 2018-12-18 MED ORDER — CHOLESTYRAMINE 4 G PO PACK: PACK | ORAL | 11 refills | Status: DC

## 2018-12-18 MED ORDER — DICYCLOMINE HCL 10 MG PO CAPS: ORAL_CAPSULE | ORAL | 3 refills | Status: DC

## 2018-12-18 NOTE — Patient Instructions (Signed)
If you are age 83 or older, your body mass index should be between 23-30. Your Body mass index is 21.63 kg/m. If this is out of the aforementioned range listed, please consider follow up with your Primary Care Provider.  If you are age 14 or younger, your body mass index should be between 19-25. Your Body mass index is 21.63 kg/m. If this is out of the aformentioned range listed, please consider follow up with your Primary Care Provider.   We have sent the following medications to your pharmacy for you to pick up at your convenience: Questran Bentyl  Use Phayzme as needed. Use Imodium as needed - up to 4 daily.  Follow up as needed.  Thank you for choosing me and [facility].   [name], PA-C

## 2018-12-19 ENCOUNTER — Encounter: Payer: Self-pay | Admitting: Physician Assistant

## 2018-12-19 NOTE — Progress Notes (Signed)
Subjective:    Patient ID: Grace Robinson, female    DOB: 01-Jul-1926, 85 y.o.   MRN: [id number]  HPI Kadedra is a delightful 85 year old white female, established with Dr. Silverio Decamp, and known to myself.  She comes in today for 1 year follow-up, and also had a recent exacerbation of diarrhea. She has diagnosis of chronic IBS D.  She is currently being maintained on a regimen of Questran 4 g once daily, 2 hours away from other meds, and dicyclomine 10 mg p.o. 3 times daily.  She says she will occasionally skip a dose of dicyclomine if she has not had any diarrhea.  She uses Imodium on a as needed basis. Continues to feel that this regimen has been "a miracle" Most days she does very well and says the above regimen controls her symptoms very well.  She occasionally will have some gas and asks whether Phazyme AC is okay to use PRN. She had called in a couple of weeks ago with a 3 to 4-day episode of loose stools despite taking her meds.  We added extra Imodium and symptoms resolved. She says she is been anxious over the past couple of weeks because she is trying to locate 2 friends home and wants to qualify for independent living. Other medical issues include congestive heart failure, dilated cardiomyopathy with EF of 5 to 50% as of 2015, insulin-dependent diabetes mellitus, Mnire's disease, hypertension and history of paroxysmal V. tach.  She is status post defibrillator.  Review of Systems Pertinent positive and negative review of systems were noted in the above HPI section.  All other review of systems was otherwise negative.  Outpatient Encounter Medications as of 12/18/2018  Medication Sig  . acetaminophen (TYLENOL) 650 MG CR tablet Take 650 mg by mouth as needed for pain.  Marland Kitchen aspirin EC 81 MG tablet Take 1 tablet (81 mg total) by mouth daily.  Marland Kitchen atorvastatin (LIPITOR) 40 MG tablet Take 40 mg by mouth daily.  . cholestyramine (QUESTRAN) 4 g packet Take 1 dose daily, may take a second dose daily as  needed.  . dicyclomine (BENTYL) 10 MG capsule Take 1 tab 30 min before meals  . furosemide (LASIX) 20 MG tablet Take 1 tablet (20 mg total) by mouth daily. alternating (40 mg) every other day.  Marland Kitchen HUMALOG MIX 75/25 (75-25) 100 UNIT/ML SUSP injection Inject 22 Units into the skin 2 (two) times daily with a meal.   . metoprolol succinate (TOPROL-XL) 100 MG 24 hr tablet Take 1 tablet by mouth 2 (two) times daily.  Marland Kitchen olmesartan (BENICAR) 40 MG tablet Take 40 mg by mouth daily.  . potassium chloride (K-DUR) 10 MEQ tablet Take 10 mEq by mouth daily.  Marland Kitchen PRIMIDONE PO Take 25 mg by mouth at bedtime.  . [DISCONTINUED] cholestyramine (QUESTRAN) 4 g packet Take 1 dose daily, may take a second dose daily as needed.  . [DISCONTINUED] dicyclomine (BENTYL) 10 MG capsule Take 1 tab 30 min before meals  . [DISCONTINUED] primidone (MYSOLINE) 50 MG tablet Take 1 tablet (50 mg total) by mouth at bedtime.   No facility-administered encounter medications on file as of 12/18/2018.    Allergies  Allergen Reactions  . Carvedilol Other (See Comments)  . Vancomycin Other (See Comments)  . Sudafed [Pseudoephedrine] Other (See Comments)    Makes skin feel like its crawling   Patient Active Problem List   Diagnosis Date Noted  . Degenerative tear of posterior horn of medial meniscus of left knee 04/14/2016  .  Tear of lateral meniscus of left knee 04/14/2016  . Synovial plica of left knee 16/08/9603  . T wave oversensing 01/17/2014  . Fall 07/10/2013  . Paroxysmal ventricular tachycardia ([place]) 12/13/2012  . Biventricular implantable cardioverter-defibrillator -Medtronic 12/08/2011  . Atrial tachycardia ([place]) 12/08/2011  . HTN (hypertension) 05/19/2011  . CARDIOMYOPATHY, PRIMARY, DILATED 12/01/2010   Social History   Socioeconomic History  . Marital status: Single    Spouse name: Not on file  . Number of children: 2  . Years of education: Not on file  . Highest education level: Not on file  Occupational  History  . 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 Financial resource strain: Not on file  . Food insecurity:    Worry: Not on file    Inability: Not on file  . Transportation needs:    Medical: Not on file    Non-medical: Not on file  Tobacco Use  . Smoking status: Former Smoker    Years: 1.00  . Smokeless tobacco: Never Used  . Tobacco comment: quit in 1970  Substance and Sexual Activity  . Alcohol use: No  . Drug use: No  . Sexual activity: Not Currently    Birth control/protection: Post-menopausal  Lifestyle  . Physical activity:    Days per week: Not on file    Minutes per session: Not on file  . Stress: Not on file  Relationships  . Social connections:    Talks on phone: Not on file    Gets together: Not on file    Attends religious service: Not on file    Active member of club or organization: Not on file    Attends meetings of clubs or organizations: Not on file    Relationship status: Not on file  . Intimate partner violence:    Fear of current or ex partner: Not on file    Emotionally abused: Not on file    Physically abused: Not on file    Forced sexual activity: Not on file  Other Topics Concern  . Not on file  Social History Narrative   Widowed, has 2 children.     Ms. Leckrone family history includes Breast cancer in her mother; Emphysema (age of onset: 17) in her father; Heart disease in her father; Lung cancer in her mother.      Objective:    Vitals:   12/18/18 1428  BP: (!) 170/70  Pulse: 74    Physical Exam; well-developed elderly white female in no acute distress, very pleasant.  Height 5 foot 5, weight 130, BMI 21.6.  HEENT ;nontraumatic normocephalic EOMI PERRLA sclera anicteric oral mucosa moist, Cardiovascular; regular rate and rhythm with S1-S2 no murmur rub or gallop,, AICD in chest wall Pulmonary; clear bilaterally.  Abdomen ;soft, nondistended nontender bowel sounds are active no palpable mass or  hepatosplenomegaly, Rectal; exam not done, Extremities ;no clubbing cyanosis or edema skin warm and dry, Neuropsych ;alert and oriented, grossly nonfocal mood and affect appropriate       Assessment & Plan:   #40 85 year old white female with chronic IBS D who comes in today for routine follow-up.  Symptoms are well controlled with current regimen. #2 insulin-dependent diabetes mellitus #3.  Congestive heart failure #4.  Dilated cardiomyopathy with EF 50% 2015 #5.  Hypertension #6.  History of paroxysmal V. tach status post defibrillator  Plan; Continue Questran 4 g daily, which she generally takes in the early evening and she is  aware to take this at least 2 hours away from other meds. Refill dicyclomine 10 mg p.o. 3 times daily AC.  Patient has been on this and has tolerated very well. Continue OTC Imodium as needed. Phazyme AC meals as needed for gas Patient will follow-up with Dr. Elmore Guise myself on an as-needed basis   Cray Monnin Genia Harold PA-C 12/19/2018   Cc: Shon Baton, MD

## 2018-12-25 NOTE — Progress Notes (Signed)
Reviewed and agree with documentation and assessment and plan. K. Veena Chasya Zenz , MD   

## 2018-12-26 ENCOUNTER — Ambulatory Visit (INDEPENDENT_AMBULATORY_CARE_PROVIDER_SITE_OTHER): Payer: Medicare Other

## 2018-12-26 DIAGNOSIS — I5022 Chronic systolic (congestive) heart failure: Secondary | ICD-10-CM

## 2018-12-26 DIAGNOSIS — L570 Actinic keratosis: Secondary | ICD-10-CM | POA: Diagnosis not present

## 2018-12-26 DIAGNOSIS — Z95810 Presence of automatic (implantable) cardiac defibrillator: Secondary | ICD-10-CM

## 2018-12-26 DIAGNOSIS — L218 Other seborrheic dermatitis: Secondary | ICD-10-CM | POA: Diagnosis not present

## 2018-12-26 DIAGNOSIS — Z85828 Personal history of other malignant neoplasm of skin: Secondary | ICD-10-CM | POA: Diagnosis not present

## 2018-12-27 NOTE — Progress Notes (Signed)
EPIC Encounter for ICM Monitoring  Patient Name: Grace Robinson is a 85 y.o. female Date: 12/27/2018 Primary Care Physican: Shon Baton, MD Primary Cardiologist: Truitt Merle, NP Electrophysiologist: Vergie Living Pacing: 98.2% Last Weight:132lbs Today's Weight: 130 lbs  Heart failure questions reviewed and she is asymptomatic. She has not been consistent in taking 40 mg on the alternate days.    Thoracic impedance normal but was abnormal since 11/07/2018.   Prescribed:Furosemide 1 tablet (20 mg total) by mouth daily alternating (40 mg) every other day.Potassium 10 mEq 3 tablets daily  Labs: 08/08/2018 Creatinine 0.83, BUN 13, Potassium 3.8, Sodium 146, GFR 62-72  Recommendations:  Advised to take Furosemide 40 mg daily x 4 days and then return to alternating 20 mg every other day with 40 mg.  Advised to limit salt intake.   Follow-up plan: ICM clinic phone appointment on 01/01/2019 to recheck fluid levels.  Office visit scheduled with Truitt Merle, NP 02/12/2019  Copy of ICM check sent to Dr. Caryl Comes and Truitt Merle, NP for review and further recommendations if needed.    3 month ICM trend: 12/26/2018    1 Year ICM trend:       [name], RN 12/27/2018 8:33 AM

## 2019-01-01 ENCOUNTER — Encounter: Payer: Self-pay | Admitting: Internal Medicine

## 2019-01-01 ENCOUNTER — Ambulatory Visit (INDEPENDENT_AMBULATORY_CARE_PROVIDER_SITE_OTHER): Payer: Medicare Other

## 2019-01-01 DIAGNOSIS — I5022 Chronic systolic (congestive) heart failure: Secondary | ICD-10-CM

## 2019-01-01 DIAGNOSIS — Z95810 Presence of automatic (implantable) cardiac defibrillator: Secondary | ICD-10-CM

## 2019-01-01 NOTE — Progress Notes (Signed)
EPIC Encounter for ICM Monitoring  Patient Name: Grace Robinson is a 85 y.o. female Date: 01/01/2019 Primary Care Physican: Shon Baton, MD Primary Cardiologist: Truitt Merle, NP Electrophysiologist: Vergie Living Pacing: 97.8% Last Weight:132lbs Today's Weight: unknown  Clinical Status (26-Dec-2018 to 01-Jan-2019) Treated VT/VF 1 episode  Heart failure questions reviewed and she is asymptomatic. She is in the process of moving to [facility] after selling her house that was on the market for 2 days.  She said she is tired but otherwise feeling fine.  Grand daughter's wedding May 10th  Thoracic impedance returned to baseline normal.  Pace terminated VT episode addressed by Chanetta Marshall, NP.   Prescribed:Furosemide 1 tablet (20 mg total) by mouth daily alternating (40 mg) every other day.Potassium 10 mEq 3 tablets daily.  Labs: 08/08/2018 Creatinine 0.83, BUN 13, Potassium 3.8, Sodium 146, GFR 62-72  Recommendations:  Advised her to continue with alternating dose of Furosemide as prescribed.    Follow-up plan: ICM clinic phone appointment on3/30/2020. She has recall 12/09/2018 to make appt with Dr Caryl Comes and forgets to call for appt.  Advised will have scheduler call her to schedule appt. She prefers the appointment after 02/08/2019.  Office visit scheduled with Truitt Merle, NP 02/12/2019  Copy of ICM check sent to [place].    3 month ICM trend: 01/01/2019    1 Year ICM trend:       [name], RN 01/01/2019 3:40 PM

## 2019-01-01 NOTE — Progress Notes (Unsigned)
CareAlert for treated VT. Episode occurred 12/31/18 at 7:54AM. Called and spoke with patient, she was asymptomatic at the time. She is in the process of selling her home and moving to [facility]. Will route to Dr Caryl Comes for review.

## 2019-01-03 NOTE — Progress Notes (Signed)
Had an episode last summer;  With successful ATP in the absence of symptoms would do nothing  Thanks

## 2019-01-10 DIAGNOSIS — I739 Peripheral vascular disease, unspecified: Secondary | ICD-10-CM | POA: Diagnosis not present

## 2019-01-10 DIAGNOSIS — E1151 Type 2 diabetes mellitus with diabetic peripheral angiopathy without gangrene: Secondary | ICD-10-CM | POA: Diagnosis not present

## 2019-01-10 DIAGNOSIS — L84 Corns and callosities: Secondary | ICD-10-CM | POA: Diagnosis not present

## 2019-01-10 DIAGNOSIS — L603 Nail dystrophy: Secondary | ICD-10-CM | POA: Diagnosis not present

## 2019-01-22 ENCOUNTER — Telehealth: Payer: Self-pay | Admitting: Neurology

## 2019-01-22 NOTE — Telephone Encounter (Signed)
Grace Robinson spoke with patient on 01/19/2019 who states she is moving and will call back to r/s appt scheduled 01/29/2019 or schedule an e-visit. Will await call back from patient. Visit cancelled.

## 2019-01-29 ENCOUNTER — Ambulatory Visit (INDEPENDENT_AMBULATORY_CARE_PROVIDER_SITE_OTHER): Payer: Medicare Other

## 2019-01-29 ENCOUNTER — Other Ambulatory Visit: Payer: Self-pay

## 2019-01-29 ENCOUNTER — Ambulatory Visit: Payer: Medicare Other | Admitting: Neurology

## 2019-01-29 DIAGNOSIS — Z95810 Presence of automatic (implantable) cardiac defibrillator: Secondary | ICD-10-CM | POA: Diagnosis not present

## 2019-01-29 DIAGNOSIS — I5022 Chronic systolic (congestive) heart failure: Secondary | ICD-10-CM

## 2019-01-30 NOTE — Progress Notes (Signed)
EPIC Encounter for ICM Monitoring  Patient Name: Grace Robinson is a 85 y.o. female Date: 01/30/2019 Primary Care Physican: Shon Baton, MD Primary Cardiologist: Truitt Merle, NP Electrophysiologist: Vergie Living Pacing: 97.8% Last Weight:132lbs 01/30/2019 Weight: unknown   Heart failure questions reviewed and sheis asymptomatic. She is to move to [facility] in 1 week but she is concerned about moving during the COVID 19 virus.   Thoracic impedance normal.    Prescribed:Furosemide 1 tablet (20 mg total) by mouth daily alternating (40 mg) every other day.Potassium 10 mEq 3 tablets daily.  Labs: 08/08/2018 Creatinine 0.83, BUN 13, Potassium 3.8, Sodium 146, GFR 62-72  Recommendations: No changes and encouraged to call for fluid symptoms.  She is unsure if she will keep her land line when she moves.  She has a cell phone but rarely uses it.    Follow-up plan: ICM clinic phone appointment on5/01/2019.Office visit scheduled with Truitt Merle, NP 02/12/2019 and Dr Caryl Comes 03/01/2019.  Copy of ICM check sent to [place].  3 month ICM trend: 01/29/2019    1 Year ICM trend:       [name], RN 01/30/2019 8:57 AM

## 2019-02-06 ENCOUNTER — Telehealth: Payer: Self-pay | Admitting: Nurse Practitioner

## 2019-02-06 NOTE — Telephone Encounter (Signed)
Spoke with Grace Robinson this morning - noted she had moved her appointment and I wanted to check in and make sure she was doing ok. She has actually sold her house, she is moving to [facility] later this week - she will require quarantine due to moving to a facility. She is feeling well - has no cardiac complaints. Her visit has already been moved to August. Will be available as needed. She is hoping to keep both her land line phone and her mobile numbers.   Burtis Junes, RN, [place] [address] [place], [place]  [phone]

## 2019-02-12 ENCOUNTER — Ambulatory Visit: Payer: Medicare Other | Admitting: Nurse Practitioner

## 2019-03-01 ENCOUNTER — Encounter: Payer: Medicare Other | Admitting: Internal Medicine

## 2019-03-01 ENCOUNTER — Ambulatory Visit (INDEPENDENT_AMBULATORY_CARE_PROVIDER_SITE_OTHER): Payer: Medicare Other | Admitting: *Deleted

## 2019-03-01 DIAGNOSIS — I428 Other cardiomyopathies: Secondary | ICD-10-CM

## 2019-03-01 DIAGNOSIS — I5022 Chronic systolic (congestive) heart failure: Secondary | ICD-10-CM

## 2019-03-01 LAB — CUP PACEART REMOTE DEVICE CHECK
Battery Remaining Longevity: 32 mo
Battery Voltage: 2.95 V
Brady Statistic AP VP Percent: 59.93 %
Brady Statistic AP VS Percent: 0.93 %
Brady Statistic AS VP Percent: 38.54 %
Brady Statistic AS VS Percent: 0.6 %
Brady Statistic RA Percent Paced: 60.57 %
Brady Statistic RV Percent Paced: 0.21 %
Date Time Interrogation Session: 20200430042204
HighPow Impedance: 55 Ohm
HighPow Impedance: 77 Ohm
Implantable Lead Implant Date: 20031007
Implantable Lead Implant Date: 20031007
Implantable Lead Implant Date: 20031007
Implantable Lead Implant Date: 20060227
Implantable Lead Location: 753858
Implantable Lead Location: 753859
Implantable Lead Location: 753860
Implantable Lead Location: 753860
Implantable Lead Model: 4193
Implantable Lead Model: 4469
Implantable Lead Model: 4470
Implantable Lead Model: 6949
Implantable Lead Serial Number: 337988
Implantable Lead Serial Number: 361236
Implantable Pulse Generator Implant Date: 20141022
Lead Channel Impedance Value: 342 Ohm
Lead Channel Impedance Value: 4047 Ohm
Lead Channel Impedance Value: 4047 Ohm
Lead Channel Impedance Value: 456 Ohm
Lead Channel Impedance Value: 475 Ohm
Lead Channel Impedance Value: 475 Ohm
Lead Channel Pacing Threshold Amplitude: 0.75 V
Lead Channel Pacing Threshold Amplitude: 0.875 V
Lead Channel Pacing Threshold Amplitude: 1 V
Lead Channel Pacing Threshold Pulse Width: 0.4 ms
Lead Channel Pacing Threshold Pulse Width: 0.4 ms
Lead Channel Pacing Threshold Pulse Width: 0.4 ms
Lead Channel Sensing Intrinsic Amplitude: 1.125 mV
Lead Channel Sensing Intrinsic Amplitude: 28.375 mV
Lead Channel Setting Pacing Amplitude: 2 V
Lead Channel Setting Pacing Amplitude: 2 V
Lead Channel Setting Pacing Amplitude: 2.5 V
Lead Channel Setting Pacing Pulse Width: 0.4 ms
Lead Channel Setting Pacing Pulse Width: 0.4 ms
Lead Channel Setting Sensing Sensitivity: 0.6 mV

## 2019-03-02 ENCOUNTER — Other Ambulatory Visit: Payer: Self-pay

## 2019-03-05 ENCOUNTER — Ambulatory Visit (INDEPENDENT_AMBULATORY_CARE_PROVIDER_SITE_OTHER): Payer: Medicare Other

## 2019-03-05 ENCOUNTER — Telehealth: Payer: Self-pay

## 2019-03-05 ENCOUNTER — Other Ambulatory Visit: Payer: Self-pay

## 2019-03-05 DIAGNOSIS — I5022 Chronic systolic (congestive) heart failure: Secondary | ICD-10-CM

## 2019-03-05 DIAGNOSIS — Z95810 Presence of automatic (implantable) cardiac defibrillator: Secondary | ICD-10-CM | POA: Diagnosis not present

## 2019-03-05 NOTE — Progress Notes (Signed)
EPIC Encounter for ICM Monitoring  Patient Name: Grace Robinson is a 85 y.o. female Date: 03/05/2019 Primary Care Physican: Shon Baton, MD Primary Cardiologist: Truitt Merle, NP Electrophysiologist: Vergie Living Pacing: 98.2% Last Weight:132lbs 03/05/2019 Weight:unknown   Attempted call to patient and unable to reach.  Left message to return call. Transmission reviewed.   Optivol Thoracic impedanceabnormal suggesting fluid accumulation since 02/09/2019.  Prescribed:Furosemide 1 tablet (20 mg total) by mouth daily alternating (40 mg) every other day.Potassium 10 mEq 3 tablets daily.  Labs: 08/08/2018 Creatinine 0.83, BUN 13, Potassium 3.8, Sodium 146, GFR 62-72  Recommendations: Unable to reach.   Follow-up plan: ICM clinic phone appointment on5/09/2019 to recheck fluid levels.  Copy of ICM check sent to Dr.Klein and Truitt Merle, NP.  3 month ICM trend: 03/05/2019    1 Year ICM trend:       [name], RN 03/05/2019 1:59 PM

## 2019-03-05 NOTE — Progress Notes (Signed)
Patient returned call.  She is asymptomatic.  She has moved into [facility] and is now eating their meals which have more salt.  Her foods are limited since she is unable to get groceries for her apartment due to the [place] shopping limitations.  It is lonely for her since the facility is not having any activities due to [place].   Advised to do the best she can with limiting salt intake.  Advised to take Furosemide 40 mg daily x 3 days and then back to alternating 40 mg every other day with 20 mg every other day.

## 2019-03-05 NOTE — Telephone Encounter (Signed)
Remote ICM transmission received.  Attempted call to patient regarding ICM remote transmission and left message, per DPR, to return call.    

## 2019-03-05 NOTE — Progress Notes (Signed)
Noted  

## 2019-03-06 DIAGNOSIS — B351 Tinea unguium: Secondary | ICD-10-CM | POA: Diagnosis not present

## 2019-03-06 DIAGNOSIS — M79671 Pain in right foot: Secondary | ICD-10-CM | POA: Diagnosis not present

## 2019-03-06 DIAGNOSIS — Q6689 Other  specified congenital deformities of feet: Secondary | ICD-10-CM | POA: Diagnosis not present

## 2019-03-06 DIAGNOSIS — M79672 Pain in left foot: Secondary | ICD-10-CM | POA: Diagnosis not present

## 2019-03-09 NOTE — Progress Notes (Signed)
Remote ICD transmission.   

## 2019-03-12 ENCOUNTER — Other Ambulatory Visit: Payer: Self-pay

## 2019-03-12 ENCOUNTER — Ambulatory Visit (INDEPENDENT_AMBULATORY_CARE_PROVIDER_SITE_OTHER): Payer: Medicare Other

## 2019-03-12 DIAGNOSIS — Z95810 Presence of automatic (implantable) cardiac defibrillator: Secondary | ICD-10-CM

## 2019-03-12 DIAGNOSIS — I5022 Chronic systolic (congestive) heart failure: Secondary | ICD-10-CM

## 2019-03-13 NOTE — Progress Notes (Signed)
EPIC Encounter for ICM Monitoring  Patient Name: Grace Robinson is a 85 y.o. female Date: 03/13/2019 Primary Care Physican: Shon Baton, MD Primary Cardiologist: Truitt Merle, NP Electrophysiologist: Vergie Living Pacing: 98.2% Last Weight:132lbs 5/12/2020Weight:unknown   Transmission reviewed. Spoke with patient.  She said she is feeling fine and is asymptomatic.  Optivol Thoracic impedance returned to normal after taking Furosemide 40 mg daily x 3 days and then back to alternating 40 mg every other day with 20 mg every other day.    Prescribed:Furosemide 1 tablet (20 mg total) by mouth daily alternating (40 mg) every other day.Potassium 10 mEq 3 tablets daily.  Labs: 08/08/2018 Creatinine 0.83, BUN 13, Potassium 3.8, Sodium 146, GFR 62-72  Recommendations: Encouraged to call for any fluid symptoms.  Follow-up plan: ICM clinic phone appointment on6/06/2019.  Copy of ICM check sent to [place].   3 month ICM trend: 03/12/2019    1 Year ICM trend:       [name], RN 03/13/2019 4:15 PM

## 2019-03-27 DIAGNOSIS — N182 Chronic kidney disease, stage 2 (mild): Secondary | ICD-10-CM | POA: Diagnosis not present

## 2019-03-27 DIAGNOSIS — I130 Hypertensive heart and chronic kidney disease with heart failure and stage 1 through stage 4 chronic kidney disease, or unspecified chronic kidney disease: Secondary | ICD-10-CM | POA: Diagnosis not present

## 2019-03-27 DIAGNOSIS — E1122 Type 2 diabetes mellitus with diabetic chronic kidney disease: Secondary | ICD-10-CM | POA: Diagnosis not present

## 2019-03-27 DIAGNOSIS — I420 Dilated cardiomyopathy: Secondary | ICD-10-CM | POA: Diagnosis not present

## 2019-04-09 ENCOUNTER — Ambulatory Visit (INDEPENDENT_AMBULATORY_CARE_PROVIDER_SITE_OTHER): Payer: Medicare Other

## 2019-04-09 DIAGNOSIS — I5022 Chronic systolic (congestive) heart failure: Secondary | ICD-10-CM | POA: Diagnosis not present

## 2019-04-09 DIAGNOSIS — Z95810 Presence of automatic (implantable) cardiac defibrillator: Secondary | ICD-10-CM | POA: Diagnosis not present

## 2019-04-10 NOTE — Progress Notes (Signed)
EPIC Encounter for ICM Monitoring  Patient Name: JUSTA HATCHELL is a 85 y.o. female Date: 04/10/2019 Primary Care Physican: Shon Baton, MD Primary Cardiologist: Truitt Merle, NP Electrophysiologist: Vergie Living Pacing: 97.9% Last Weight:132lbs 6/9/2020Weight:131 lbs   Transmission reviewed. Spoke with patient.  She said she is feeling fine and is asymptomatic.  She is living in Assisted living facility that supplies her with meals which are not low sodium.  OptivolThoracic impedance abnormalfor the last 2 weeks.   Prescribed:Furosemide 20 mg take 1 tablet (20 mg total) by mouth daily alternating (40 mg) every other day.Potassium 10 mEq 3 tablets daily.  Labs: 08/08/2018 Creatinine 0.83, BUN 13, Potassium 3.8, Sodium 146, GFR 62-72  Recommendations:Advised to take Furosemide 40 mg daily x 3 days and then return to prescribed dosage.  Follow-up plan: ICM clinic phone appointment on6/10/2019 to recheck fluids.  Copy of ICM check sent to Dr.Klein and Truitt Merle, NP.   3 month ICM trend: 04/09/2019    1 Year ICM trend:       [name], RN 04/10/2019 11:38 AM

## 2019-04-10 NOTE — Progress Notes (Signed)
Noted  

## 2019-04-13 ENCOUNTER — Ambulatory Visit: Payer: Medicare Other

## 2019-04-13 DIAGNOSIS — I5022 Chronic systolic (congestive) heart failure: Secondary | ICD-10-CM

## 2019-04-13 DIAGNOSIS — Z95810 Presence of automatic (implantable) cardiac defibrillator: Secondary | ICD-10-CM

## 2019-04-13 NOTE — Progress Notes (Signed)
EPIC Encounter for ICM Monitoring  Patient Name: GWENDOLINE JUDY is a 85 y.o. female Date: 04/13/2019 Primary Care Physican: Shon Baton, MD Primary Cardiologist: Truitt Merle, NP Electrophysiologist: Vergie Living Pacing: 97.9% Last Weight:132lbs 6/9/2020Weight:131 lbs   Transmission reviewed. Spoke with patient. She said she is feeling fine and is asymptomatic.  She is living in Assisted living facility that supplies her with meals which are not low sodium.  OptivolThoracic impedancereturned close to normal.  Prescribed:Furosemide 20 mg take 1 tablet (20 mg total) by mouth daily alternating (40 mg) every other day.Potassium 10 mEq 3 tablets daily.  Labs: 08/08/2018 Creatinine 0.83, BUN 13, Potassium 3.8, Sodium 146, GFR 62-72  Recommendations:Advised to continue to limit salt intake  Follow-up plan: ICM clinic phone appointment on7/13/2020.  Copy of ICM check sent to [place].  3 month ICM trend: 04/13/2019    1 Year ICM trend:       [name], RN 04/13/2019 3:02 PM

## 2019-05-14 ENCOUNTER — Ambulatory Visit (INDEPENDENT_AMBULATORY_CARE_PROVIDER_SITE_OTHER): Payer: Medicare Other

## 2019-05-14 DIAGNOSIS — I5022 Chronic systolic (congestive) heart failure: Secondary | ICD-10-CM

## 2019-05-14 DIAGNOSIS — Z95810 Presence of automatic (implantable) cardiac defibrillator: Secondary | ICD-10-CM

## 2019-05-15 NOTE — Progress Notes (Signed)
EPIC Encounter for ICM Monitoring  Patient Name: DAPHANIE OQUENDO is a 85 y.o. female Date: 05/15/2019 Primary Care Physican: Shon Baton, MD Primary Cardiologist: Truitt Merle, NP Electrophysiologist: Vergie Living Pacing: 98.2% Last Weight:132lbs 6/9/2020Weight:131 lbs   Transmission reviewed. Spoke with patient. She said she is feeling fine and is asymptomatic.  OptivolThoracic impedancenormal.  Prescribed:Furosemide20 mg take1 tablet (20 mg total) by mouth daily alternating (40 mg) every other day.Potassium 10 mEq 3 tablets daily.  Labs: 08/08/2018 Creatinine 0.83, BUN 13, Potassium 3.8, Sodium 146, GFR 62-72  Recommendations:No changes and encouraged to call if experiencing any fluid symptoms.  Follow-up plan: ICM clinic phone appointment on9/21/2020.Office visit with Truitt Merle, NP on 06/11/2019 and Dr Caryl Comes on 06/18/2019.  Copy of ICM check sent to [place].  3 month ICM trend: 05/14/2019    1 Year ICM trend:       [name], RN 05/15/2019 4:50 PM

## 2019-05-31 ENCOUNTER — Ambulatory Visit (INDEPENDENT_AMBULATORY_CARE_PROVIDER_SITE_OTHER): Payer: Medicare Other | Admitting: *Deleted

## 2019-05-31 DIAGNOSIS — I5022 Chronic systolic (congestive) heart failure: Secondary | ICD-10-CM

## 2019-05-31 DIAGNOSIS — I428 Other cardiomyopathies: Secondary | ICD-10-CM

## 2019-05-31 LAB — CUP PACEART REMOTE DEVICE CHECK
Battery Remaining Longevity: 29 mo
Battery Voltage: 2.95 V
Brady Statistic AP VP Percent: 54.65 %
Brady Statistic AP VS Percent: 0.83 %
Brady Statistic AS VP Percent: 43.82 %
Brady Statistic AS VS Percent: 0.69 %
Brady Statistic RA Percent Paced: 55.3 %
Brady Statistic RV Percent Paced: 0.1 %
Date Time Interrogation Session: 20200730062603
HighPow Impedance: 52 Ohm
HighPow Impedance: 72 Ohm
Implantable Lead Implant Date: 20031007
Implantable Lead Implant Date: 20031007
Implantable Lead Implant Date: 20031007
Implantable Lead Implant Date: 20060227
Implantable Lead Location: 753858
Implantable Lead Location: 753859
Implantable Lead Location: 753860
Implantable Lead Location: 753860
Implantable Lead Model: 4193
Implantable Lead Model: 4469
Implantable Lead Model: 4470
Implantable Lead Model: 6949
Implantable Lead Serial Number: 337988
Implantable Lead Serial Number: 361236
Implantable Pulse Generator Implant Date: 20141022
Lead Channel Impedance Value: 342 Ohm
Lead Channel Impedance Value: 4047 Ohm
Lead Channel Impedance Value: 4047 Ohm
Lead Channel Impedance Value: 475 Ohm
Lead Channel Impedance Value: 475 Ohm
Lead Channel Impedance Value: 475 Ohm
Lead Channel Pacing Threshold Amplitude: 0.75 V
Lead Channel Pacing Threshold Amplitude: 0.875 V
Lead Channel Pacing Threshold Amplitude: 1.125 V
Lead Channel Pacing Threshold Pulse Width: 0.4 ms
Lead Channel Pacing Threshold Pulse Width: 0.4 ms
Lead Channel Pacing Threshold Pulse Width: 0.4 ms
Lead Channel Sensing Intrinsic Amplitude: 1.375 mV
Lead Channel Sensing Intrinsic Amplitude: 31.375 mV
Lead Channel Setting Pacing Amplitude: 2 V
Lead Channel Setting Pacing Amplitude: 2 V
Lead Channel Setting Pacing Amplitude: 2.5 V
Lead Channel Setting Pacing Pulse Width: 0.4 ms
Lead Channel Setting Pacing Pulse Width: 0.4 ms
Lead Channel Setting Sensing Sensitivity: 0.6 mV

## 2019-06-06 ENCOUNTER — Telehealth: Payer: Self-pay | Admitting: *Deleted

## 2019-06-06 NOTE — Telephone Encounter (Signed)
Noted  

## 2019-06-06 NOTE — Telephone Encounter (Signed)
Virtual Visit Pre-Appointment Phone Call  "(Name), I am calling you today to discuss your upcoming appointment. We are currently trying to limit exposure to the virus that causes COVID-19 by seeing patients at home rather than in the office."  1. "What is the BEST phone number to call the day of the visit?" - include this in appointment notes  2. "Do you have or have access to (through a family member/friend) a smartphone with video capability that we can use for your visit?" a. If yes - list this number in appt notes as "cell" (if different from BEST phone #) and list the appointment type as a VIDEO visit in appointment notes b. If no - list the appointment type as a PHONE visit in appointment notes  3. Confirm consent - "In the setting of the current Covid19 crisis, you are scheduled for a (phone) visit with your provider on (Sept 1) at (9:00 am).  Just as we do with many in-office visits, in order for you to participate in this visit, we must obtain consent.  If you'd like, I can send this to your mychart (if signed up) or email for you to review.  Otherwise, I can obtain your verbal consent now.  All virtual visits are billed to your insurance company just like a normal visit would be.  By agreeing to a virtual visit, we'd like you to understand that the technology does not allow for your provider to perform an examination, and thus may limit your provider's ability to fully assess your condition. If your provider identifies any concerns that need to be evaluated in person, we will make arrangements to do so.  Finally, though the technology is pretty good, we cannot assure that it will always work on either your or our end, and in the setting of a video visit, we may have to convert it to a phone-only visit.  In either situation, we cannot ensure that we have a secure connection.  Are you willing to proceed?" STAFF: Did the patient verbally acknowledge consent to telehealth visit? Document YES/NO  here: YES  4. Advise patient to be prepared - "Two hours prior to your appointment, go ahead and check your blood pressure, pulse, oxygen saturation, and your weight (if you have the equipment to check those) and write them all down. When your visit starts, your provider will ask you for this information. If you have an Apple Watch or Kardia device, please plan to have heart rate information ready on the day of your appointment. Please have a pen and paper handy nearby the day of the visit as well."  5. Give patient instructions for MyChart download to smartphone OR Doximity/Doxy.me as below if video visit (depending on what platform provider is using)  6. Inform patient they will receive a phone call 15 minutes prior to their appointment time (may be from unknown caller ID) so they should be prepared to answer    TELEPHONE CALL NOTE  Grace Robinson has been deemed a candidate for a follow-up tele-health visit to limit community exposure during the Covid-19 pandemic. I spoke with the patient via phone to ensure availability of phone/video source, confirm preferred email & phone number, and discuss instructions and expectations.  I reminded Grace Robinson to be prepared with any vital sign and/or heart rhythm information that could potentially be obtained via home monitoring, at the time of her visit. I reminded Grace Robinson to expect a phone call prior to  her visit.  Grace Robinson Grace Robinson 06/06/2019 4:48 PM   INSTRUCTIONS FOR DOWNLOADING THE MYCHART APP TO SMARTPHONE  - The patient must first make sure to have activated MyChart and know their login information - If Apple, go to [employer] and type in MyChart in the search bar and download the app. If Android, ask patient to go to [employer] and type in [place] in the search bar and download the app. The app is free but as with any other app downloads, their phone may require them to verify saved payment information or Apple/Android  password.  - The patient will need to then log into the app with their MyChart username and password, and select [facility] as their healthcare provider to link the account. When it is time for your visit, go to the MyChart app, find appointments, and click Begin Video Visit. Be sure to Select Allow for your device to access the Microphone and Camera for your visit. You will then be connected, and your provider will be with you shortly.  **If they have any issues connecting, or need assistance please contact MyChart service desk [phone])**  **If using a computer, in order to ensure the best quality for their visit they will need to use either of the following Internet Browsers: [employer], or Google Chrome**  IF USING DOXIMITY or DOXY.ME - The patient will receive a link just prior to their visit by text.     FULL LENGTH CONSENT FOR TELE-HEALTH VISIT   I hereby voluntarily request, consent and authorize [facility] and its employed or contracted physicians, physician assistants, nurse practitioners or other licensed health care professionals (the Practitioner), to provide me with telemedicine health care services (the "Services") as deemed necessary by the treating Practitioner. I acknowledge and consent to receive the Services by the Practitioner via telemedicine. I understand that the telemedicine visit will involve communicating with the Practitioner through live audiovisual communication technology and the disclosure of certain medical information by electronic transmission. I acknowledge that I have been given the opportunity to request an in-person assessment or other available alternative prior to the telemedicine visit and am voluntarily participating in the telemedicine visit.  I understand that I have the right to withhold or withdraw my consent to the use of telemedicine in the course of my care at any time, without affecting my right to future care or treatment,  and that the Practitioner or I may terminate the telemedicine visit at any time. I understand that I have the right to inspect all information obtained and/or recorded in the course of the telemedicine visit and may receive copies of available information for a reasonable fee.  I understand that some of the potential risks of receiving the Services via telemedicine include:  Marland Kitchen Delay or interruption in medical evaluation due to technological equipment failure or disruption; . Information transmitted may not be sufficient (e.g. poor resolution of images) to allow for appropriate medical decision making by the Practitioner; and/or  . In rare instances, security protocols could fail, causing a breach of personal health information.  Furthermore, I acknowledge that it is my responsibility to provide information about my medical history, conditions and care that is complete and accurate to the best of my ability. I acknowledge that Practitioner's advice, recommendations, and/or decision may be based on factors not within their control, such as incomplete or inaccurate data provided by me or distortions of diagnostic images or specimens that may result from electronic transmissions. I  understand that the practice of medicine is not an exact science and that Practitioner makes no warranties or guarantees regarding treatment outcomes. I acknowledge that I will receive a copy of this consent concurrently upon execution via email to the email address I last provided but may also request a printed copy by calling the office of [facility].    I understand that my insurance will be billed for this visit.   I have read or had this consent read to me. . I understand the contents of this consent, which adequately explains the benefits and risks of the Services being provided via telemedicine.  . I have been provided ample opportunity to ask questions regarding this consent and the Services and have had my questions  answered to my satisfaction. . I give my informed consent for the services to be provided through the use of telemedicine in my medical care  By participating in this telemedicine visit I agree to the above.

## 2019-06-06 NOTE — Telephone Encounter (Signed)
Pt calling in due to does not feel safe coming to office.  Made pt a telehealth visit for Sept 1.  Pt would like Cecille Rubin advise on 8/17 appt with Dr.Klein, can pt cancel.  Pt is doing well with no complaints. Will send to [place] to [place].

## 2019-06-08 ENCOUNTER — Encounter: Payer: Self-pay | Admitting: Cardiology

## 2019-06-08 NOTE — Progress Notes (Signed)
Remote ICD transmission.   

## 2019-06-11 ENCOUNTER — Ambulatory Visit: Payer: Medicare Other | Admitting: Nurse Practitioner

## 2019-06-11 NOTE — Telephone Encounter (Signed)
S/w pt does not feel comfortable coming to office for upcoming appt with Dr. Caryl Comes.  Appt changed in system to telehealth visit.  Staff message sent to Dr. Olin Pia nurse.

## 2019-06-18 ENCOUNTER — Encounter: Payer: Self-pay | Admitting: Internal Medicine

## 2019-06-18 ENCOUNTER — Other Ambulatory Visit: Payer: Self-pay

## 2019-06-18 ENCOUNTER — Telehealth (INDEPENDENT_AMBULATORY_CARE_PROVIDER_SITE_OTHER): Payer: Medicare Other | Admitting: Internal Medicine

## 2019-06-18 ENCOUNTER — Ambulatory Visit (INDEPENDENT_AMBULATORY_CARE_PROVIDER_SITE_OTHER): Payer: Medicare Other

## 2019-06-18 VITALS — Ht 65.0 in | Wt 130.0 lb

## 2019-06-18 DIAGNOSIS — I428 Other cardiomyopathies: Secondary | ICD-10-CM

## 2019-06-18 DIAGNOSIS — I5022 Chronic systolic (congestive) heart failure: Secondary | ICD-10-CM

## 2019-06-18 DIAGNOSIS — I472 Ventricular tachycardia, unspecified: Secondary | ICD-10-CM

## 2019-06-18 DIAGNOSIS — Z95810 Presence of automatic (implantable) cardiac defibrillator: Secondary | ICD-10-CM

## 2019-06-18 NOTE — Progress Notes (Signed)
Electrophysiology TeleHealth Note   Due to national recommendations of social distancing due to COVID 19, an audio/video telehealth visit is felt to be most appropriate for this patient at this time.  See MyChart message from today for the patient's consent to telehealth for [facility]   Date:  06/18/2019   ID:  Grace Robinson, DOB 01-25-1926, MRN [id number]  Location: patient's home  Provider location: [address], [place]  Evaluation Performed: Follow-up visit  PCP:  Shon Baton, MD  Cardiologist:  LG Electrophysiologist:  SK   Chief Complaint:   VT  History of Present Illness:    Grace Robinson is a 85 y.o. female who presents via audio/video conferencing for a telehealth visit today.  The patient did not have access to video technology/had technical difficulties with video requiring transitioning to audio format only (telephone).  All issues noted in this document were discussed and addressed.  No physical exam could be performed with this format.    Since last being seen in our clinic for resolved NICM and VT s/p CRT-D with interval ATP, the patient reports she moved into her friend's home [place] and has been isolated ever since  The patient denies chest pain, shortness of breath, nocturnal dyspnea, orthopnea or peripheral edema.  There have been no palpitations, lightheadedness or syncope.    Date Cr K Hgb  10/19 0.83 3.8 13.2         DATE TEST EF   6/09 Echo   55-65 %   9/15 Echo   55-65 %           The patient denies symptoms of fevers, chills, cough, or new SOB worrisome for COVID 19.    Past Medical History:  Diagnosis Date  . Biventricular ICD (implantable cardiac defibrillator) in place   . Cellulitis   . Change in bowel habits   . CHF (congestive heart failure) ([place])   . Colon polyps   . Diabetes mellitus    Type II  . Diarrhea   . Dyslipidemia   . HOH (hard of hearing)    left  . HTN (hypertension)   . Ischemic cardiomyopathy    . LBBB (left bundle branch block)    chronic  . Meniere's disease   . T wave oversensing 01/17/2014  . Ulcerative colitis   . Varicose veins    4 lazer  treatment each leg    Past Surgical History:  Procedure Laterality Date  . ABDOMINAL HYSTERECTOMY    . CARDIAC CATHETERIZATION  08/2011  . CHOLECYSTECTOMY    . COLONOSCOPY    . EP Study  08/22/08  . IMPLANTABLE CARDIOVERTER DEFIBRILLATOR GENERATOR CHANGE N/A 08/22/2013   Procedure: IMPLANTABLE CARDIOVERTER DEFIBRILLATOR GENERATOR CHANGE;  Surgeon: Deboraha Sprang, MD;  Location: [facility] CATH LAB;  Service: Cardiovascular;  Laterality: N/A;  . KNEE ARTHROSCOPY Left 04/14/2016   Procedure: LEFT KNEE ARTHROSCOPY, PARTIAL MEDIAL AND PARTIAL LATERAL MENISCECTOMY, MEDIAL PLICA;  Surgeon: Dorna Leitz, MD;  Location: [facility];  Service: Orthopedics;  Laterality: Left;  . mastoid sugery     in the setting of Meniere's disease  . TONSILLECTOMY AND ADENOIDECTOMY      Current Outpatient Medications  Medication Sig Dispense Refill  . aspirin EC 81 MG tablet Take 1 tablet (81 mg total) by mouth daily.    Marland Kitchen atorvastatin (LIPITOR) 40 MG tablet Take 40 mg by mouth daily.  3  . Biotin 2500 MCG CAPS Take 1 tablet by mouth  daily.    . Cholecalciferol (VITAMIN D-3) 25 MCG (1000 UT) CAPS Take 1 capsule by mouth daily.    Marland Kitchen dicyclomine (BENTYL) 10 MG capsule Take 1 tab 30 min before meals 270 capsule 3  . furosemide (LASIX) 20 MG tablet Take 1 tablet (20 mg total) by mouth daily. alternating (40 mg) every other day. 60 tablet 11  . HUMALOG MIX 75/25 (75-25) 100 UNIT/ML SUSP injection Inject 22 Units into the skin 2 (two) times daily with a meal.   1  . metoprolol succinate (TOPROL-XL) 100 MG 24 hr tablet Take 1 tablet by mouth 2 (two) times daily.    Marland Kitchen olmesartan (BENICAR) 40 MG tablet Take 40 mg by mouth daily.  3  . potassium chloride (K-DUR) 10 MEQ tablet Take 1 tablet by mouth 2 (two) times daily.    Marland Kitchen PRIMIDONE PO Take 12.5 mg by mouth at bedtime.     .  vitamin B-12 (CYANOCOBALAMIN) 1000 MCG tablet Take 1,000 mcg by mouth daily.     No current facility-administered medications for this visit.     Allergies:   Carvedilol, Vancomycin, and Sudafed [pseudoephedrine]   Social History:  The patient  reports that she has quit smoking. She quit after 1.00 year of use. She has never used smokeless tobacco. She reports that she does not drink alcohol or use drugs.   Family History:  The patient's   family history includes Breast cancer in her mother; Emphysema (age of onset: 14) in her father; Heart disease in her father; Lung cancer in her mother.   ROS:  Please see the history of present illness.   All other systems are personally reviewed and negative.    Exam:    Vital Signs:  Ht 5\' 5"  (1.651 m)   Wt 130 lb (59 kg)   BMI 21.63 kg/m     Labs/Other Tests and Data Reviewed:    Recent Labs: 08/08/2018: BUN 13; Creatinine, Ser 0.83; Hemoglobin 13.2; Magnesium 2.1; Platelets 152; Potassium 3.8; Sodium 146   Wt Readings from Last 3 Encounters:  06/18/19 130 lb (59 kg)  12/18/18 130 lb (59 kg)  08/30/18 134 lb (60.8 kg)     Other studies personally reviewed: Additional studies/ records that were reviewed today include: As above   R  Last device remote is reviewed from [facility] PDF dated 7/20 which reveals normal device function,   arrhythmias - none     ASSESSMENT & PLAN:    Nonischemic cardiomyopathy -resolved  Congestive heart failure ? HFpEF  CRT-D-[employer]  hyperension   Ventricular tachycardia  2019/20 Rx with ATP   VT as noted  No chf symptoms or fluid  Continue current meds    COVID 19 screen The patient denies symptoms of COVID 19 at this time.  The importance of social distancing was discussed today.  Follow-up:  12 m SK  LGerhardt 42m Next remote: As Scheduled  Current medicines are reviewed at length with the patient today.   The patient does not have concerns regarding her medicines.  The following  changes were made today:  none  Labs/ tests ordered today include:   No orders of the defined types were placed in this encounter.   Future tests ( post COVID )    thsPatient Risk:  after full review of this patients clinical status, I feel that they are at moderate risk at this time.  Today, I have spent 8 minutes with the patient with telehealth technology discussing the above.  Signed, Virl Axe, MD  06/18/2019 2:24 PM     [facility] [address] [place] [address] [phone] (office) [phone] (fax)

## 2019-06-19 NOTE — Progress Notes (Signed)
EPIC Encounter for ICM Monitoring  Patient Name: Grace Robinson is a 85 y.o. female Date: 06/19/2019 Primary Care Physican: Shon Baton, MD Primary Cardiologist: Truitt Merle, NP Electrophysiologist: Vergie Living Pacing: 98.2% 6/9/2020Weight:131 lbs   Attempted call to patient and unable to reach.  Left detailed message per DPR regarding transmission. Transmission reviewed.   OptivolThoracic impedancenormal.  Prescribed:Furosemide20 mg take1 tablet (20 mg total) by mouth daily alternating (40 mg) every other day.Potassium 10 mEq 1 tablet twice a day.  Labs: 08/08/2018 Creatinine 0.83, BUN 13, Potassium 3.8, Sodium 146, GFR 62-72  Recommendations: Left voice mail with ICM number and encouraged to call if experiencing any fluid symptoms.  Follow-up plan: ICM clinic phone appointment on10/03/2019.Office visit with Truitt Merle, NP on 07/03/2019.  Copy of ICM check sent to [place].   3 month ICM trend: 06/18/2019    1 Year ICM trend:       [name], RN 06/19/2019 2:55 PM

## 2019-06-28 ENCOUNTER — Telehealth: Payer: Self-pay | Admitting: *Deleted

## 2019-06-28 NOTE — Telephone Encounter (Signed)
lvm with instructions for upcoming telehealth visit.

## 2019-06-29 NOTE — Progress Notes (Signed)
Telehealth Visit     Virtual Visit via Video Note   This visit type was conducted due to national recommendations for restrictions regarding the COVID-19 Pandemic (e.g. social distancing) in an effort to limit this patient's exposure and mitigate transmission in our community.  Due to her co-morbid illnesses, this patient is at least at moderate risk for complications without adequate follow up.  This format is felt to be most appropriate for this patient at this time.  All issues noted in this document were discussed and addressed.  A limited physical exam was performed with this format.  Please refer to the patient's chart for her consent to telehealth for [facility].   Evaluation Performed:  Follow-up visit  This visit type was conducted due to national recommendations for restrictions regarding the COVID-19 Pandemic (e.g. social distancing).  This format is felt to be most appropriate for this patient at this time.  All issues noted in this document were discussed and addressed.  No physical exam was performed (except for noted visual exam findings with Video Visits).  Please refer to the patient's chart (MyChart message for video visits and phone note for telephone visits) for the patient's consent to telehealth for [facility].  Date:  07/03/2019   ID:  Grace Robinson, DOB November 08, 1925, MRN [id number]  Patient Location:  Home  Provider location:   Home  PCP:  Shon Baton, MD  Cardiologist:  Servando Snare & No primary care provider on file.  Electrophysiologist:  None   Chief Complaint:  Follow up   History of Present Illness:    Grace Robinson is a 85 y.o. female who presents via audio/video conferencing for a telehealth visit today.  Seen for Dr. Percival Spanish and Dr. Caryl Comes. She is a former patient of Dr. Susa Simmonds. Primarily follows with me.   She has an nonischemic CM with remote EF down to 25% that has improved with aggressive medical therapy and CRT-D. Last generator change in 2014.  Other issues include HLD, IDDM, ulcerative colitis, LBBB, Meniere's disease and advanced age. Last echo back in 07/2014 showed her EF had normalized at 50 to 55%.   I have seen her over the past several years. She has basically done pretty well from our standpoint.Some occasional issues with swelling/elevated BP but for the most part has done ok. I sent her to see [name] with GI who helped her tremendously with her diarrhea.   I last saw her in October - she was making plans to move to [facility]. She had had a spell of VT that was treated with ATP back last August. She was not interested in having any testing to see if EF has changed. She was to not drive for 6 months.   She had a telehealth visit back in August with Dr. Caryl Comes - was doing ok.   The patient does not have symptoms concerning for COVID-19 infection (fever, chills, cough, or new shortness of breath).   Seen today by a telephone conversation. She has consented for this visit. She moved to [facility] just as the shut down occurred. She is still very isolated. She cannot have her family come in yet - this has been quite hard for her. She was not able to take her dog - this has been very hard for her - she misses "[name]" so much. She is able to go out and walk around the grounds. She is reading a lot. She denies any chest pain. Her breathing is stable. She  will take extra Lasix as needed. Overall, she feels like she is doing the best she can. She is still driving a little.   Past Medical History:  Diagnosis Date  . Biventricular ICD (implantable cardiac defibrillator) in place   . Cellulitis   . Change in bowel habits   . CHF (congestive heart failure) ([place])   . Colon polyps   . Diabetes mellitus    Type II  . Diarrhea   . Dyslipidemia   . HOH (hard of hearing)    left  . HTN (hypertension)   . Ischemic cardiomyopathy   . LBBB (left bundle branch block)    chronic  . Meniere's disease   . T wave oversensing 01/17/2014  .  Ulcerative colitis   . Varicose veins    4 lazer  treatment each leg   Past Surgical History:  Procedure Laterality Date  . ABDOMINAL HYSTERECTOMY    . CARDIAC CATHETERIZATION  08/2011  . CHOLECYSTECTOMY    . COLONOSCOPY    . EP Study  08/22/08  . IMPLANTABLE CARDIOVERTER DEFIBRILLATOR GENERATOR CHANGE N/A 08/22/2013   Procedure: IMPLANTABLE CARDIOVERTER DEFIBRILLATOR GENERATOR CHANGE;  Surgeon: Deboraha Sprang, MD;  Location: [facility] CATH LAB;  Service: Cardiovascular;  Laterality: N/A;  . KNEE ARTHROSCOPY Left 04/14/2016   Procedure: LEFT KNEE ARTHROSCOPY, PARTIAL MEDIAL AND PARTIAL LATERAL MENISCECTOMY, MEDIAL PLICA;  Surgeon: Dorna Leitz, MD;  Location: [facility];  Service: Orthopedics;  Laterality: Left;  . mastoid sugery     in the setting of Meniere's disease  . TONSILLECTOMY AND ADENOIDECTOMY       Current Meds  Medication Sig  . aspirin EC 81 MG tablet Take 1 tablet (81 mg total) by mouth daily.  Marland Kitchen atorvastatin (LIPITOR) 40 MG tablet Take 40 mg by mouth daily.  . Biotin 5 MG CAPS Take 1 capsule by mouth daily.  . Cholecalciferol (VITAMIN D-3) 25 MCG (1000 UT) CAPS Take 1 capsule by mouth daily.  Marland Kitchen dicyclomine (BENTYL) 10 MG capsule Take 1 tab 30 min before meals  . furosemide (LASIX) 20 MG tablet Take 1 tablet (20 mg total) by mouth daily. alternating (40 mg) every other day.  Marland Kitchen HUMALOG MIX 75/25 (75-25) 100 UNIT/ML SUSP injection Inject 22 Units into the skin 2 (two) times daily with a meal.   . metoprolol succinate (TOPROL-XL) 100 MG 24 hr tablet Take 1 tablet by mouth 2 (two) times daily.  Marland Kitchen olmesartan (BENICAR) 40 MG tablet Take 40 mg by mouth daily.  . potassium chloride (K-DUR) 10 MEQ tablet Take 1 tablet by mouth 2 (two) times daily.  Marland Kitchen PRIMIDONE PO Take 12.5 mg by mouth at bedtime.   . vitamin B-12 (CYANOCOBALAMIN) 1000 MCG tablet Take 1,000 mcg by mouth daily.  . [DISCONTINUED] Biotin 2500 MCG CAPS Take 1 tablet by mouth daily.     Allergies:   Carvedilol, Vancomycin, and  Sudafed [pseudoephedrine]   Social History   Tobacco Use  . Smoking status: Former Smoker    Years: 1.00  . Smokeless tobacco: Never Used  . Tobacco comment: quit in 1970  Substance Use Topics  . Alcohol use: No  . Drug use: No     Family Hx: The patient's family history includes Breast cancer in her mother; Emphysema (age of onset: 32) in her father; Heart disease in her father; Lung cancer in her mother. There is no history of Colon cancer, Rectal cancer, Stomach cancer, Liver cancer, or Esophageal cancer.  ROS:   Please see the  history of present illness.   All other systems reviewed are negative.    Objective:    Vital Signs:  BP (!) 143/92   Pulse 62   Wt 133 lb (60.3 kg)   BMI 22.13 kg/m    Wt Readings from Last 3 Encounters:  07/03/19 133 lb (60.3 kg)  06/18/19 130 lb (59 kg)  12/18/18 130 lb (59 kg)    Alert female in no acute distress. She sounds good. Not short of breath with conversation.    Labs/Other Tests and Data Reviewed:    Lab Results  Component Value Date   WBC 6.3 08/08/2018   HGB 13.2 08/08/2018   HCT 39.0 08/08/2018   PLT 152 08/08/2018   GLUCOSE 112 (H) 08/08/2018   ALT 19 07/30/2016   AST 22 07/30/2016   NA 146 (H) 08/08/2018   K 3.8 08/08/2018   CL 106 08/08/2018   CREATININE 0.83 08/08/2018   BUN 13 08/08/2018   CO2 22 08/08/2018   TSH 1.23 07/20/2011   INR 1.1 (H) 08/13/2013        BNP (last 3 results) No results for input(s): BNP in the last 8760 hours.  ProBNP (last 3 results) No results for input(s): PROBNP in the last 8760 hours.    Prior CV studies:    The following studies were reviewed today:  Echo Study Conclusions from 07/2014  - Left ventricle: The cavity size was normal. Systolic function was normal. The estimated ejection fraction was in the range of 50% to 55%. There was an increased relative contribution of atrial contraction to ventricular filling. Doppler parameters are consistent with  abnormal left ventricular relaxation (grade 1 diastolic dysfunction). - Ventricular septum: Septal motion showed paradox. These changes are consistent with right ventricular pacing. - Aortic valve: There was mild regurgitation. - Mitral valve: There is a calcified mobile density in the LV cavity most consistent with calcified redundant chordae tendinae. This was present on echo in 2014. There was mild regurgitation. - Pulmonic valve: There was trivial regurgitation.     ASSESSMENT & PLAN:     1. NICM - she had recovery of her EF per echo back in 2015. We have followed her clinically since this time. She is doing well despite the shutdown/stay at home/lockdown situation. She does need lab - she is going to try and get thru Dr. Virgina Jock since he has a drive thru option for her. No changes made today.   2. Underlying CRT-D - she had recent telehealth visit with Dr. Caryl Comes earlier this year.   3. HTN - BP is fair and given her age, I have left her on her current regimen.   4. Advanced age - turning 43 next week.   5. COVID-19 Education: The signs and symptoms of COVID-19 were discussed with the patient and how to seek care for testing (follow up with PCP or arrange E-visit).  The importance of social distancing, staying at home, hand hygiene and wearing a mask when out in public were discussed today.  Patient Risk:   After full review of this patient's clinical status, I feel that they are at least moderate risk at this time.  Time:   Today, I have spent 12 minutes with the patient with telehealth technology discussing the above issues.     Medication Adjustments/Labs and Tests Ordered: Current medicines are reviewed at length with the patient today.  Concerns regarding medicines are outlined above.   Tests Ordered: No orders of the defined types  were placed in this encounter.   Medication Changes: No orders of the defined types were placed in this encounter.    Disposition:  FU with me in 4 months.   Patient is agreeable to this plan and will call if any problems develop in the interim.   [name], NP  07/03/2019 10:03 AM    [place]

## 2019-07-03 ENCOUNTER — Encounter: Payer: Self-pay | Admitting: Nurse Practitioner

## 2019-07-03 ENCOUNTER — Telehealth (INDEPENDENT_AMBULATORY_CARE_PROVIDER_SITE_OTHER): Payer: Medicare Other | Admitting: Nurse Practitioner

## 2019-07-03 ENCOUNTER — Other Ambulatory Visit: Payer: Self-pay

## 2019-07-03 VITALS — BP 143/92 | HR 62 | Wt 133.0 lb

## 2019-07-03 DIAGNOSIS — Z95810 Presence of automatic (implantable) cardiac defibrillator: Secondary | ICD-10-CM

## 2019-07-03 DIAGNOSIS — I5022 Chronic systolic (congestive) heart failure: Secondary | ICD-10-CM | POA: Diagnosis not present

## 2019-07-03 DIAGNOSIS — I428 Other cardiomyopathies: Secondary | ICD-10-CM

## 2019-07-03 DIAGNOSIS — I10 Essential (primary) hypertension: Secondary | ICD-10-CM

## 2019-07-03 DIAGNOSIS — Z7189 Other specified counseling: Secondary | ICD-10-CM

## 2019-07-03 NOTE — Patient Instructions (Addendum)
After Visit Summary:  We will be checking the following labs today - NONE  See if you can get your labs done thru the drive thru at Dr. Keane Police and ask him to send me a copy.   Medication Instructions:    Continue with your current medicines.    If you need a refill on your cardiac medications before your next appointment, please call your pharmacy.     Testing/Procedures To Be Arranged:  N/A  Follow-Up:   See me in 4 months - will try for in the office but can change to a phone visit if needed.     At [facility], you and your health needs are our priority.  As part of our continuing mission to provide you with exceptional heart care, we have created designated Provider Care Teams.  These Care Teams include your primary Cardiologist (physician) and Advanced Practice Providers (APPs -  Physician Assistants and Nurse Practitioners) who all work together to provide you with the care you need, when you need it.  Special Instructions:  . Stay safe, stay home, wash your hands for at least 20 seconds and wear a mask when out in public.  . It was good to talk with you today.  Marland Kitchen Happy Birthday!! . Keep your spirits up!   Call the [place] office at [phone] if you have any questions, problems or concerns.

## 2019-07-04 DIAGNOSIS — E1122 Type 2 diabetes mellitus with diabetic chronic kidney disease: Secondary | ICD-10-CM | POA: Diagnosis not present

## 2019-07-04 DIAGNOSIS — M859 Disorder of bone density and structure, unspecified: Secondary | ICD-10-CM | POA: Diagnosis not present

## 2019-07-23 ENCOUNTER — Encounter: Payer: Medicare Other | Admitting: *Deleted

## 2019-07-24 DIAGNOSIS — I420 Dilated cardiomyopathy: Secondary | ICD-10-CM | POA: Diagnosis not present

## 2019-07-24 DIAGNOSIS — N182 Chronic kidney disease, stage 2 (mild): Secondary | ICD-10-CM | POA: Diagnosis not present

## 2019-07-24 DIAGNOSIS — R251 Tremor, unspecified: Secondary | ICD-10-CM | POA: Diagnosis not present

## 2019-07-24 DIAGNOSIS — R627 Adult failure to thrive: Secondary | ICD-10-CM | POA: Diagnosis not present

## 2019-07-25 ENCOUNTER — Other Ambulatory Visit: Payer: Self-pay

## 2019-07-25 MED ORDER — PRIMIDONE 50 MG PO TABS: 50.0000 mg | ORAL_TABLET | Freq: Every day | ORAL | 1 refills | Status: DC

## 2019-07-25 NOTE — Telephone Encounter (Signed)
Requested Prescriptions   Pending Prescriptions Disp Refills  . primidone (MYSOLINE) 50 MG tablet 90 tablet 1    Sig: Take 1 tablet (50 mg total) by mouth at bedtime.   Rx last filled:08/30/18 #90 1 refill  Pt last seen: 08/30/18  Follow up appt scheduled:08/06/19

## 2019-07-26 ENCOUNTER — Telehealth: Payer: Self-pay | Admitting: Neurology

## 2019-07-26 MED ORDER — PRIMIDONE 50 MG PO TABS: 50.0000 mg | ORAL_TABLET | Freq: Every day | ORAL | 1 refills | Status: DC

## 2019-07-26 NOTE — Telephone Encounter (Signed)
Requested Prescriptions   Pending Prescriptions Disp Refills  . primidone (MYSOLINE) 50 MG tablet 90 tablet 1    Sig: Take 1 tablet (50 mg total) by mouth at bedtime.   Rx last filled:yesterday sent to different pharmacy  Pt last seen:08/30/18  Follow up appt scheduled:08/06/19  Resent to pharmacy of patient request

## 2019-07-26 NOTE — Telephone Encounter (Signed)
Patient called regarding her needing a refill on her Primidone medication 50 MG/ RX #  [id number]. She uses [facility] on [address]. She is completely out of the medication .The Pharmacy had told her they would contact our office on 07/23/19. Thanks

## 2019-08-01 DIAGNOSIS — L570 Actinic keratosis: Secondary | ICD-10-CM | POA: Diagnosis not present

## 2019-08-01 DIAGNOSIS — L821 Other seborrheic keratosis: Secondary | ICD-10-CM | POA: Diagnosis not present

## 2019-08-01 DIAGNOSIS — E1122 Type 2 diabetes mellitus with diabetic chronic kidney disease: Secondary | ICD-10-CM | POA: Diagnosis not present

## 2019-08-01 DIAGNOSIS — L905 Scar conditions and fibrosis of skin: Secondary | ICD-10-CM | POA: Diagnosis not present

## 2019-08-01 DIAGNOSIS — Z85828 Personal history of other malignant neoplasm of skin: Secondary | ICD-10-CM | POA: Diagnosis not present

## 2019-08-02 NOTE — Progress Notes (Signed)
Grace Robinson was seen today in the movement disorders clinic for neurologic c onsultation at the request of Shon Baton, MD.  The consultation is for the evaluation of tremor.  The records that were made available to me were reviewed.  Tremor: Yes.     How long has it been going on? 1 year - 1.5 years  At rest or with activation?  Mostly at rest, with hand half way between pronation and supination.  Only in the L hand.  It has gotten a little more frequent but not a lot.  Only may happen one time every 2 days  Fam hx of tremor?  No.  Affected by caffeine:  unknown (1 cup tea per day; some caffeineted soda)  Affected by alcohol:  Does not drink alcohol  Affected by stress:  Yes.    Spills soup if on spoon:  No., she is R hand dominant  Spills glass of liquid if full:  No.  Specific Symptoms:  Voice: more "gravely" Sleep: sleeps well  Vivid Dreams:  No.  Acting out dreams:  No. Wet Pillows: No. Postural symptoms:  Yes.  , relates some to menieres  Falls?  No. Bradykinesia symptoms: difficulty getting out of a chair Loss of smell:  No. Loss of taste:  No. Urinary Incontinence:  No. but has urinary frequency Difficulty Swallowing:  No. Handwriting, micrographia: No. Depression:  No. Memory changes:  No. Hallucinations:  No.  visual distortions: No. N/V:  No. but does have chronic diarrhea with IBS Lightheaded:  No.  Syncope: No. Diplopia:  No. Dyskinesia:  No.  Neuroimaging of the brain has not previously been performed in the recent years.  She had a CT brain in 2004.  It was normal  08/30/18 update: Patient is seen today in follow-up for essential tremor.  I have not seen her in almost a year.  Pt states that tremor is increasing, now both L and R hand.  Trouble getting backs on the earrings.  Cannot button buttons because of the tremor.  Able to eat soup.  Shakes more in AM than in the evening.  Shakes more with activation than at rest.  No coffee but drinks some tea/soda  and that doesn't change tremor.  Doesn't drink EtOH much.    08/06/2019 update: Patient seen today in follow-up for essential tremor.  Have not seen her in a year.  At that point in time, we started her on primidone.  Unfortunately, she was not able to tolerate the medication, even at 50 mg.  She did take it for about 2 months before she even called me to tell me that she was not feeling right.  She felt unsteady, nauseated and sleepy (patient denies any nausea today with the full dose previously).  She did not want to try anything else, she did not want to stay on the medication, she just wanted to go off of it at that time, which was okay with me.  She then called about 10 days after going off of the medication stating that she wanted to restart the medication because her tremor was so much worse.  We reminded her how miserable she told us she was on the medication, and she stated that she wanted to go back on it.  I told her to only go back on 25 mg this time.  She was to make an appointment with me in March, but she stated that she was moving and could not make a  visit.  She moved to [facility].  That has been a bit lonely since they are locked down during the pandemic.  Today, she reports that she is taking 25 mg and "I'm doing all right.  The only time I notice my hands shaking is if i'm stressed."  She has not had any falls.   ALLERGIES:   Allergies  Allergen Reactions  . Carvedilol Other (See Comments)  . Vancomycin Other (See Comments)  . Sudafed [Pseudoephedrine] Other (See Comments)    Makes skin feel like its crawling    CURRENT MEDICATIONS:  Outpatient Encounter Medications as of 08/06/2019  Medication Sig  . aspirin EC 81 MG tablet Take 1 tablet (81 mg total) by mouth daily.  Marland Kitchen atorvastatin (LIPITOR) 40 MG tablet Take 40 mg by mouth daily.  . Biotin 5 MG CAPS Take 1 capsule by mouth daily.  . Cholecalciferol (VITAMIN D-3) 25 MCG (1000 UT) CAPS Take 1 capsule by mouth daily.  Marland Kitchen  dicyclomine (BENTYL) 10 MG capsule Take 1 tab 30 min before meals  . furosemide (LASIX) 20 MG tablet Take 1 tablet (20 mg total) by mouth daily. alternating (40 mg) every other day.  Marland Kitchen HUMALOG MIX 75/25 (75-25) 100 UNIT/ML SUSP injection Inject 22 Units into the skin 2 (two) times daily with a meal.   . metoprolol succinate (TOPROL-XL) 100 MG 24 hr tablet Take 1 tablet by mouth 2 (two) times daily.  Marland Kitchen olmesartan (BENICAR) 40 MG tablet Take 40 mg by mouth daily.  . potassium chloride (K-DUR) 10 MEQ tablet Take 1 tablet by mouth 2 (two) times daily.  . primidone (MYSOLINE) 50 MG tablet Take 1 tablet (50 mg total) by mouth at bedtime.  . vitamin B-12 (CYANOCOBALAMIN) 1000 MCG tablet Take 1,000 mcg by mouth daily.   No facility-administered encounter medications on file as of 08/06/2019.     PAST MEDICAL HISTORY:   Past Medical History:  Diagnosis Date  . Biventricular ICD (implantable cardiac defibrillator) in place   . Cellulitis   . Change in bowel habits   . CHF (congestive heart failure) ([place])   . Colon polyps   . Diabetes mellitus    Type II  . Diarrhea   . Dyslipidemia   . HOH (hard of hearing)    left  . HTN (hypertension)   . Ischemic cardiomyopathy   . LBBB (left bundle branch block)    chronic  . Meniere's disease   . T wave oversensing 01/17/2014  . Ulcerative colitis   . Varicose veins    4 lazer  treatment each leg    PAST SURGICAL HISTORY:   Past Surgical History:  Procedure Laterality Date  . ABDOMINAL HYSTERECTOMY    . CARDIAC CATHETERIZATION  08/2011  . CHOLECYSTECTOMY    . COLONOSCOPY    . EP Study  08/22/08  . IMPLANTABLE CARDIOVERTER DEFIBRILLATOR GENERATOR CHANGE N/A 08/22/2013   Procedure: IMPLANTABLE CARDIOVERTER DEFIBRILLATOR GENERATOR CHANGE;  Surgeon: Deboraha Sprang, MD;  Location: [facility] CATH LAB;  Service: Cardiovascular;  Laterality: N/A;  . KNEE ARTHROSCOPY Left 04/14/2016   Procedure: LEFT KNEE ARTHROSCOPY, PARTIAL MEDIAL AND PARTIAL LATERAL  MENISCECTOMY, MEDIAL PLICA;  Surgeon: Dorna Leitz, MD;  Location: [place];  Service: Orthopedics;  Laterality: Left;  . mastoid sugery     in the setting of Meniere's disease  . TONSILLECTOMY AND ADENOIDECTOMY      SOCIAL HISTORY:   Social History   Socioeconomic History  . Marital status: Single    Spouse  name: Not on file  . Number of children: 2  . Years of education: Not on file  . Highest education level: Bachelor's degree (e.g., BA, AB, BS)  Occupational History  . 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 Financial resource strain: Not on file  . Food insecurity    Worry: Not on file    Inability: Not on file  . Transportation needs    Medical: Not on file    Non-medical: Not on file  Tobacco Use  . Smoking status: Former Smoker    Years: 1.00  . Smokeless tobacco: Never Used  . Tobacco comment: quit in 1970  Substance and Sexual Activity  . Alcohol use: No  . Drug use: No  . Sexual activity: Not Currently    Birth control/protection: Post-menopausal  Lifestyle  . Physical activity    Days per week: Not on file    Minutes per session: Not on file  . Stress: Not on file  Relationships  . Social Herbalist on phone: Not on file    Gets together: Not on file    Attends religious service: Not on file    Active member of club or organization: Not on file    Attends meetings of clubs or organizations: Not on file    Relationship status: Not on file  . Intimate partner violence    Fear of current or ex partner: Not on file    Emotionally abused: Not on file    Physically abused: Not on file    Forced sexual activity: Not on file  Other Topics Concern  . Not on file  Social History Narrative   Widowed, has 2 children.     FAMILY HISTORY:   Family Status  Relation Name Status  . Father  Deceased at age 35       emphysema  . Mother  Deceased at age 21       CANCER  . Child x2 Alive  . Neg Hx  (Not Specified)     ROS:  Review of Systems  Constitutional: Negative.   HENT: Negative.   Eyes: Negative.   Respiratory: Negative.   Cardiovascular: Negative.   Gastrointestinal: Negative.   Genitourinary: Negative.   Skin: Negative.      PHYSICAL EXAMINATION:    VITALS:   Vitals:   08/06/19 1347  BP: (!) 160/88  Pulse: 82  SpO2: 97%  Weight: 133 lb 14.4 oz (60.7 kg)  Height: 5' 6.5" (1.689 m)    GEN:  The patient appears stated age and is in NAD. HEENT:  Normocephalic, atraumatic.  The mucous membranes are moist. The superficial temporal arteries are without ropiness or tenderness. CV:  RRR Lungs:  CTAB Neck/HEME:  There are no carotid bruits bilaterally.  Neurological examination:  Orientation: The patient is alert and oriented x3. Cranial nerves: There is good facial symmetry. The speech is fluent and clear. Soft palate rises symmetrically and there is no tongue deviation. Hearing is intact to conversational tone. Sensation: Sensation is intact to light touch throughout Motor: Strength is 5/5 in the bilateral upper and lower extremities.   Shoulder shrug is equal and symmetric.  There is no pronator drift.  Movement examination: Tone: There is normal tone in the upper and lower extremities. Abnormal movements: There is no rest tremor.  There really is very mild intention tremor.  Archimedes spirals are drawn much better than 1 year ago, especially  much improved on the left. Coordination:  There is no decremation with RAM's Gait and Station: The patient ambulates well in the hall.   Last lab work was done in July 04, 2019.  I have reviewed this.  Sodium is 139, potassium 4.3, chloride 101, CO2 26, AST 27, ALT 32, alkaline phosphatase 83.  White blood cells are 6.3, hemoglobin 13.4, hematocrit 41.7 and platelets 133.  ASSESSMENT/PLAN:  1.   Essential Tremor.  -Doing well on very low-dose primidone, 25 mg daily.  She just had a new prescription sent by our office, and should have  enough for the year.  She will let me know if any concerns or questions arise before our next visit.  I will plan on seeing her back in 1 year, sooner should new neurologic issues arise.

## 2019-08-06 ENCOUNTER — Other Ambulatory Visit: Payer: Self-pay

## 2019-08-06 ENCOUNTER — Ambulatory Visit: Payer: Medicare Other | Admitting: Neurology

## 2019-08-06 ENCOUNTER — Encounter: Payer: Self-pay | Admitting: Neurology

## 2019-08-06 ENCOUNTER — Ambulatory Visit (INDEPENDENT_AMBULATORY_CARE_PROVIDER_SITE_OTHER): Payer: Medicare Other

## 2019-08-06 VITALS — BP 160/88 | HR 82 | Ht 66.5 in | Wt 133.9 lb

## 2019-08-06 DIAGNOSIS — G250 Essential tremor: Secondary | ICD-10-CM | POA: Diagnosis not present

## 2019-08-06 DIAGNOSIS — I5022 Chronic systolic (congestive) heart failure: Secondary | ICD-10-CM

## 2019-08-06 DIAGNOSIS — Z95810 Presence of automatic (implantable) cardiac defibrillator: Secondary | ICD-10-CM | POA: Diagnosis not present

## 2019-08-06 NOTE — Patient Instructions (Signed)
Good to see you today!  Continue primidone 50 mg - 1/2 tablet at night  The physicians and staff at [facility] are committed to providing excellent care. You may receive a survey requesting feedback about your experience at our office. We strive to receive "very good" responses to the survey questions. If you feel that your experience would prevent you from giving the office a "very good " response, please contact our office to try to remedy the situation. We may be reached at [phone]. Thank you for taking the time out of your busy day to complete the survey.

## 2019-08-07 ENCOUNTER — Telehealth: Payer: Self-pay

## 2019-08-07 NOTE — Telephone Encounter (Signed)
Remote ICM transmission received.  Attempted call to patient regarding ICM remote transmission and left detailed message per DPR.  Advised to return call for any fluid symptoms or questions. Next ICM remote transmission scheduled 09/14/2019.

## 2019-08-07 NOTE — Progress Notes (Signed)
EPIC Encounter for ICM Monitoring  Patient Name: Grace Robinson is a 85 y.o. female Date: 08/07/2019 Primary Care Physican: Shon Baton, MD Primary Cardiologist: Truitt Merle, NP Electrophysiologist: Vergie Living Pacing: 98.2% 9/1/2020Weight:133 lbs   Attempted call to patient and unable to reach.  Left detailed message per DPR regarding transmission. Transmission reviewed.   OptivolThoracic impedancenormal.  Prescribed:Furosemide20 mg take1 tablet (20 mg total) by mouth daily alternating (40 mg) every other day.Potassium 10 mEq 1 tablet twice a day.  Labs: 07/04/2019 Creatinine 1.0, BUN 14, Potassium 4.3, Sodium 139, GFR 52.1-63.0  Recommendations: Left voice mail with ICM number and encouraged to call if experiencing any fluid symptoms.  Follow-up plan: ICM clinic phone appointment on 09/14/2019.   91 day device clinic remote transmission 08/30/2019.  Office appt 10/17/2019 with Truitt Merle, NP.    Copy of ICM check sent to Dr. Caryl Comes.   3 month ICM trend: 08/06/2019    1 Year ICM trend:       [name], RN 08/07/2019 9:55 AM

## 2019-08-30 ENCOUNTER — Ambulatory Visit (INDEPENDENT_AMBULATORY_CARE_PROVIDER_SITE_OTHER): Payer: Medicare Other | Admitting: *Deleted

## 2019-08-30 DIAGNOSIS — I472 Ventricular tachycardia: Secondary | ICD-10-CM

## 2019-08-30 DIAGNOSIS — I471 Supraventricular tachycardia: Secondary | ICD-10-CM

## 2019-08-30 DIAGNOSIS — I4729 Other ventricular tachycardia: Secondary | ICD-10-CM

## 2019-08-30 LAB — CUP PACEART REMOTE DEVICE CHECK
Battery Remaining Longevity: 27 mo
Battery Voltage: 2.95 V
Brady Statistic AP VP Percent: 38.38 %
Brady Statistic AP VS Percent: 0.57 %
Brady Statistic AS VP Percent: 60.11 %
Brady Statistic AS VS Percent: 0.94 %
Brady Statistic RA Percent Paced: 38.83 %
Brady Statistic RV Percent Paced: 0.12 %
Date Time Interrogation Session: 20201029052302
HighPow Impedance: 53 Ohm
HighPow Impedance: 75 Ohm
Implantable Lead Implant Date: 20031007
Implantable Lead Implant Date: 20031007
Implantable Lead Implant Date: 20031007
Implantable Lead Implant Date: 20060227
Implantable Lead Location: 753858
Implantable Lead Location: 753859
Implantable Lead Location: 753860
Implantable Lead Location: 753860
Implantable Lead Model: 4193
Implantable Lead Model: 4469
Implantable Lead Model: 4470
Implantable Lead Model: 6949
Implantable Lead Serial Number: 337988
Implantable Lead Serial Number: 361236
Implantable Pulse Generator Implant Date: 20141022
Lead Channel Impedance Value: 361 Ohm
Lead Channel Impedance Value: 4047 Ohm
Lead Channel Impedance Value: 4047 Ohm
Lead Channel Impedance Value: 456 Ohm
Lead Channel Impedance Value: 475 Ohm
Lead Channel Impedance Value: 513 Ohm
Lead Channel Pacing Threshold Amplitude: 0.75 V
Lead Channel Pacing Threshold Amplitude: 0.75 V
Lead Channel Pacing Threshold Amplitude: 1 V
Lead Channel Pacing Threshold Pulse Width: 0.4 ms
Lead Channel Pacing Threshold Pulse Width: 0.4 ms
Lead Channel Pacing Threshold Pulse Width: 0.4 ms
Lead Channel Sensing Intrinsic Amplitude: 1.25 mV
Lead Channel Sensing Intrinsic Amplitude: 31.375 mV
Lead Channel Setting Pacing Amplitude: 1.75 V
Lead Channel Setting Pacing Amplitude: 2 V
Lead Channel Setting Pacing Amplitude: 2.5 V
Lead Channel Setting Pacing Pulse Width: 0.4 ms
Lead Channel Setting Pacing Pulse Width: 0.4 ms
Lead Channel Setting Sensing Sensitivity: 0.6 mV

## 2019-09-14 ENCOUNTER — Ambulatory Visit (INDEPENDENT_AMBULATORY_CARE_PROVIDER_SITE_OTHER): Payer: Medicare Other

## 2019-09-14 DIAGNOSIS — Z95810 Presence of automatic (implantable) cardiac defibrillator: Secondary | ICD-10-CM | POA: Diagnosis not present

## 2019-09-14 DIAGNOSIS — I5022 Chronic systolic (congestive) heart failure: Secondary | ICD-10-CM | POA: Diagnosis not present

## 2019-09-14 NOTE — Progress Notes (Signed)
EPIC Encounter for ICM Monitoring  Patient Name: MOO HOLZMANN is a 85 y.o. female Date: 09/14/2019 Primary Care Physican: Shon Baton, MD Primary Cardiologist: Truitt Merle, NP Electrophysiologist: Vergie Living Pacing: 98.2% LastWeight:133 lbs   Spoke with patient.  She said she is doing fine.   OptivolThoracic impedancenormal.  Prescribed:Furosemide20 mg take1 tablet (20 mg total) by mouth daily alternating (40 mg) every other day.Potassium 10 mEq1tablet twice a day.  Labs: 07/04/2019 Creatinine 1.0, BUN 14, Potassium 4.3, Sodium 139, GFR 52.1-63.0  Recommendations:  No changes and encouraged to call if experiencing any fluid symptoms.  Follow-up plan: ICM clinic phone appointment on 10/15/2019.   91 day device clinic remote transmission 11/29/2019.  Office appt 10/17/2019 with Truitt Merle, NP.    Copy of ICM check sent to Dr. Caryl Comes.   3 month ICM trend: 09/14/2019    1 Year ICM trend:       [name], RN 09/14/2019 3:47 PM

## 2019-09-21 NOTE — Progress Notes (Signed)
Remote ICD transmission.   

## 2019-10-04 DIAGNOSIS — E119 Type 2 diabetes mellitus without complications: Secondary | ICD-10-CM | POA: Diagnosis not present

## 2019-10-13 NOTE — Progress Notes (Addendum)
Telehealth Visit     Virtual Visit via Video Note   This visit type was conducted due to national recommendations for restrictions regarding the COVID-19 Pandemic (e.g. social distancing) in an effort to limit this patient's exposure and mitigate transmission in our community.  Due to her co-morbid illnesses, this patient is at least at moderate risk for complications without adequate follow up.  This format is felt to be most appropriate for this patient at this time.  All issues noted in this document were discussed and addressed.  A limited physical exam was performed with this format.  Please refer to the patient's chart for her consent to telehealth for [facility].   Evaluation Performed:  Follow-up visit  This visit type was conducted due to national recommendations for restrictions regarding the COVID-19 Pandemic (e.g. social distancing).  This format is felt to be most appropriate for this patient at this time.  All issues noted in this document were discussed and addressed.  No physical exam was performed (except for noted visual exam findings with Video Visits).  Please refer to the patient's chart (MyChart message for video visits and phone note for telephone visits) for the patient's consent to telehealth for [facility].  Date:  10/17/2019   ID:  Grace Robinson, DOB [birth year], MRN [id number]  Patient Location:  [place]  Provider location:   [facility] Office  PCP:  Shon Baton, MD  Cardiologist:  Servando Snare & No primary care provider on file.  Electrophysiologist:  None   Chief Complaint:  Follow up visit  History of Present Illness:    Grace Robinson is a 85 y.o. female who presents via audio/video conferencing for a telehealth visit today.  Seen for Dr. Percival Spanish and Dr. Caryl Comes. She is a former patient of Dr. Susa Simmonds. Primarily follows with me.   She has an nonischemic CM with remote EF down to 25% that has improved with aggressive medical therapy  and CRT-D. Last generator change in 2014. Other issues include HLD, IDDM, ulcerative colitis, LBBB, Meniere's disease and advanced age. Last echo back in 07/2014 showed her EF had normalized at 50 to 55%.   I have seen herover the past several years. She has basically done pretty well from our standpoint.Some occasional issues with swelling/elevated BP but for the most part has done ok. I sent her to see [name] with GI who helped her tremendously with her diarrhea.   I last saw her in the office back in October of 2019 - she was making plans to move to [facility]. She had had a spell of VT that was treated with ATP back last August. She was not interested in having any testing to see if EF has changed. She was to not drive for 6 months.  She had a telehealth visit back in August with Dr. Caryl Comes and then again with me back in early September. She had moved - got shutdown and it was quite an adjustment for her. Cardiac status felt to be ok. She was back driving some - mainly to see her dog "[name]" that she had to give up - fortunately, family took her.   The patient does not have symptoms concerning for COVID-19 infection (fever, chills, cough, or new shortness of breath).   Seen today by a telephone call. She has previously consented for this visit. She does not have Internet. She says she is doing ok but "dying of loneliness". She has had a fall - that happened  about 3 weeks ago - was in the hallway. Not sure if she passed out or not.  Really does not know why she fell - notes she was walking - then she was down in the floor. She had some headaches afterwards - she was advised by the staff to go to the ER - she did not wish to do this - she has had Meniere's - which she feels took her balance away. BP was pretty high right after this fall - over [id number] systolic and then about 30 minutes later it came down and was ok. She notes her headaches are now improving. She is not nauseated. Very much arousable and  clearly she says she is improving. No chest pain. Breathing is ok. Her balance is getting worse and she is using a cane - now using a walker. There is construction around her and the walkways are very uneven. She is not walking now as much due to this along with the cold weather. She dropped her BP cuff today - not able to check her BP today but she notes the nurse is coming out on Friday and will check her.  Past Medical History:  Diagnosis Date  . Biventricular ICD (implantable cardiac defibrillator) in place   . Cellulitis   . Change in bowel habits   . CHF (congestive heart failure) ([place])   . Colon polyps   . Diabetes mellitus    Type II  . Diarrhea   . Dyslipidemia   . HOH (hard of hearing)    left  . HTN (hypertension)   . Ischemic cardiomyopathy   . LBBB (left bundle branch block)    chronic  . Meniere's disease   . T wave oversensing 01/17/2014  . Ulcerative colitis   . Varicose veins    4 lazer  treatment each leg   Past Surgical History:  Procedure Laterality Date  . ABDOMINAL HYSTERECTOMY    . CARDIAC CATHETERIZATION  08/2011  . CHOLECYSTECTOMY    . COLONOSCOPY    . EP Study  08/22/08  . IMPLANTABLE CARDIOVERTER DEFIBRILLATOR GENERATOR CHANGE N/A 08/22/2013   Procedure: IMPLANTABLE CARDIOVERTER DEFIBRILLATOR GENERATOR CHANGE;  Surgeon: Deboraha Sprang, MD;  Location: [facility] CATH LAB;  Service: Cardiovascular;  Laterality: N/A;  . KNEE ARTHROSCOPY Left 04/14/2016   Procedure: LEFT KNEE ARTHROSCOPY, PARTIAL MEDIAL AND PARTIAL LATERAL MENISCECTOMY, MEDIAL PLICA;  Surgeon: Dorna Leitz, MD;  Location: [place];  Service: Orthopedics;  Laterality: Left;  . mastoid sugery     in the setting of Meniere's disease  . TONSILLECTOMY AND ADENOIDECTOMY       Current Meds  Medication Sig  . aspirin EC 81 MG tablet Take 1 tablet (81 mg total) by mouth daily.  Marland Kitchen atorvastatin (LIPITOR) 40 MG tablet Take 40 mg by mouth daily.  . Biotin 5000 MCG CAPS Take 1 capsule by mouth daily.  .  Cholecalciferol (VITAMIN D-3) 25 MCG (1000 UT) CAPS Take 2 capsules by mouth daily.   Marland Kitchen dicyclomine (BENTYL) 10 MG capsule Take 1 tab 30 min before meals  . furosemide (LASIX) 20 MG tablet Take 1 tablet (20 mg total) by mouth daily. alternating (40 mg) every other day.  Marland Kitchen HUMALOG MIX 75/25 (75-25) 100 UNIT/ML SUSP injection Inject 22 Units into the skin 2 (two) times daily with a meal.   . metoprolol succinate (TOPROL-XL) 100 MG 24 hr tablet Take 1 tablet by mouth 2 (two) times daily.  Marland Kitchen olmesartan (BENICAR) 40 MG tablet Take 40 mg  by mouth daily.  . potassium chloride (K-DUR) 10 MEQ tablet Take 1 tablet by mouth 2 (two) times daily.  . primidone (MYSOLINE) 50 MG tablet Take 1 tablet (50 mg total) by mouth at bedtime. (Patient taking differently: Take 25 mg by mouth at bedtime. )  . vitamin B-12 (CYANOCOBALAMIN) 1000 MCG tablet Take 1,000 mcg by mouth daily.  . vitamin E 400 UNIT capsule Take 400 Units by mouth daily.     Allergies:   Carvedilol, Vancomycin, and Sudafed [pseudoephedrine]   Social History   Tobacco Use  . Smoking status: Former Smoker    Years: 1.00  . Smokeless tobacco: Never Used  . Tobacco comment: quit in 1970  Substance Use Topics  . Alcohol use: No  . Drug use: No     Family Hx: The patient's family history includes Breast cancer in her mother; Emphysema (age of onset: 5) in her father; Healthy in her child; Heart disease in her father; Lung cancer in her mother. There is no history of Colon cancer, Rectal cancer, Stomach cancer, Liver cancer, or Esophageal cancer.  ROS:   Please see the history of present illness.   All other systems reviewed are negative.    Objective:    Vital Signs:  There were no vitals taken for this visit.   Wt Readings from Last 3 Encounters:  08/06/19 133 lb 14.4 oz (60.7 kg)  07/03/19 133 lb (60.3 kg)  06/18/19 130 lb (59 kg)    Alert female in no acute distress. She sounds good - very appropriate. Not short of breath with  conversation.    Labs/Other Tests and Data Reviewed:    Lab Results  Component Value Date   WBC 6.3 08/08/2018   HGB 13.2 08/08/2018   HCT 39.0 08/08/2018   PLT 152 08/08/2018   GLUCOSE 112 (H) 08/08/2018   ALT 19 07/30/2016   AST 22 07/30/2016   NA 146 (H) 08/08/2018   K 3.8 08/08/2018   CL 106 08/08/2018   CREATININE 0.83 08/08/2018   BUN 13 08/08/2018   CO2 22 08/08/2018   TSH 1.23 07/20/2011   INR 1.1 (H) 08/13/2013        BNP (last 3 results) No results for input(s): BNP in the last 8760 hours.  ProBNP (last 3 results) No results for input(s): PROBNP in the last 8760 hours.    Prior CV studies:    The following studies were reviewed today:  Echo Study Conclusions from 07/2014  - Left ventricle: The cavity size was normal. Systolic function was normal. The estimated ejection fraction was in the range of 50% to 55%. There was an increased relative contribution of atrial contraction to ventricular filling. Doppler parameters are consistent with abnormal left ventricular relaxation (grade 1 diastolic dysfunction). - Ventricular septum: Septal motion showed paradox. These changes are consistent with right ventricular pacing. - Aortic valve: There was mild regurgitation. - Mitral valve: There is a calcified mobile density in the LV cavity most consistent with calcified redundant chordae tendinae. This was present on echo in 2014. There was mild regurgitation. - Pulmonic valve: There was trivial regurgitation.     ASSESSMENT & PLAN:     1. Recent fall - probably had a concussion from her description - she has had a recent remote check - nothing worrisome noted. She did not wish to seek medical attention. She is improving.   2. NICM - EF recovered by echo from 2015 - sounds like cardiac status is ok.  3. Underlying CRT-D - followed by EP  4. HTN - she is going to see if the nurse coming out on Friday will check this for her.   5.  Advanced age - she is now 40.   6. Situational stress with the pandemic - this has been quite hard for her.   7. COVID-19 Education: The signs and symptoms of COVID-19 were discussed with the patient and how to seek care for testing (follow up with PCP or arrange E-visit).  The importance of social distancing, staying at home, hand hygiene and wearing a mask when out in public were discussed today.  Patient Risk:   After full review of this patient's clinical status, I feel that they are at least moderate risk at this time.  Time:   Today, I have spent 10 minutes with the patient with telehealth technology discussing the above issues.     Medication Adjustments/Labs and Tests Ordered: Current medicines are reviewed at length with the patient today.  Concerns regarding medicines are outlined above.   Tests Ordered: No orders of the defined types were placed in this encounter.   Medication Changes: No orders of the defined types were placed in this encounter.   Disposition:  FU with me in 4 months.   Patient is agreeable to this plan and will call if any problems develop in the interim.   [name], NP  10/17/2019 1:50 PM     [facility]   Addendum: Jenyfer called back later this afternoon - BP is 130/74 by the nurse.   Burtis Junes, RN, [place] [address] [place], [place]  [address] [phone]

## 2019-10-15 ENCOUNTER — Ambulatory Visit (INDEPENDENT_AMBULATORY_CARE_PROVIDER_SITE_OTHER): Payer: Medicare Other

## 2019-10-15 ENCOUNTER — Telehealth: Payer: Self-pay

## 2019-10-15 DIAGNOSIS — I5022 Chronic systolic (congestive) heart failure: Secondary | ICD-10-CM | POA: Diagnosis not present

## 2019-10-15 DIAGNOSIS — Z95810 Presence of automatic (implantable) cardiac defibrillator: Secondary | ICD-10-CM

## 2019-10-15 NOTE — Telephone Encounter (Signed)
Remote ICM transmission received.  Attempted call to patient regarding ICM remote transmission and left detailed message per DPR.  Advised to return call for any fluid symptoms or questions. Next ICM remote transmission scheduled 09/08/2020.     

## 2019-10-15 NOTE — Progress Notes (Signed)
EPIC Encounter for ICM Monitoring  Patient Name: Grace Robinson is a 85 y.o. female Date: 10/15/2019 Primary Care Physican: Shon Baton, MD Primary Cardiologist: Truitt Merle, NP Electrophysiologist: Vergie Living Pacing: 98.2% LastWeight:133lbs  Since 14-Sep-2019 VT-NS (>4 beats, >130 bpm)  4 AT/AF  0 Time in AT/AF  <0.1 hr/day (<0.1%)    Attempted call to patient and unable to reach.  Left detailed message per DPR regarding transmission. Transmission reviewed.   OptivolThoracic impedancenormal.  Prescribed:Furosemide20 mg take1 tablet (20 mg total) by mouth daily alternating (40 mg) every other day.Potassium 10 mEq1tablet twice a day.  Labs: 07/04/2019 Creatinine 1.0, BUN 14, Potassium 4.3, Sodium 139, GFR 52.1-63.0  Recommendations: Left voice mail with ICM number and encouraged to call if experiencing any fluid symptoms.  Follow-up plan: ICM clinic phone appointment on1/18/2021. 91 day device clinic remote transmission 11/29/2019. Office appt 10/17/2019 withLori Gerhardt,NP.      Copy of ICM check sent to Dr. Caryl Comes.   3 month ICM trend: 10/15/2019    1 Year ICM trend:       [name], RN 10/15/2019 4:20 PM

## 2019-10-17 ENCOUNTER — Telehealth (INDEPENDENT_AMBULATORY_CARE_PROVIDER_SITE_OTHER): Payer: Medicare Other | Admitting: Nurse Practitioner

## 2019-10-17 ENCOUNTER — Encounter: Payer: Self-pay | Admitting: Nurse Practitioner

## 2019-10-17 ENCOUNTER — Other Ambulatory Visit: Payer: Self-pay

## 2019-10-17 VITALS — BP 130/74

## 2019-10-17 DIAGNOSIS — W19XXXD Unspecified fall, subsequent encounter: Secondary | ICD-10-CM

## 2019-10-17 DIAGNOSIS — I5022 Chronic systolic (congestive) heart failure: Secondary | ICD-10-CM | POA: Diagnosis not present

## 2019-10-17 DIAGNOSIS — I428 Other cardiomyopathies: Secondary | ICD-10-CM

## 2019-10-17 DIAGNOSIS — Z7189 Other specified counseling: Secondary | ICD-10-CM

## 2019-10-17 DIAGNOSIS — Z95810 Presence of automatic (implantable) cardiac defibrillator: Secondary | ICD-10-CM

## 2019-10-17 NOTE — Patient Instructions (Addendum)
After Visit Summary:  We will be checking the following labs today - NONE  Medication Instructions:    Continue with your current medicines.    If you need a refill on your cardiac medications before your next appointment, please call your pharmacy.     Testing/Procedures To Be Arranged:  N/A  Follow-Up:   See me in 4 months.     At [facility], you and your health needs are our priority.  As part of our continuing mission to provide you with exceptional heart care, we have created designated Provider Care Teams.  These Care Teams include your primary Cardiologist (physician) and Advanced Practice Providers (APPs -  Physician Assistants and Nurse Practitioners) who all work together to provide you with the care you need, when you need it.  Special Instructions:  . Stay safe, stay home, wash your hands for at least 20 seconds and wear a mask when out in public.  . It was good to talk with you today.  . See if the nurse will check your BP for me sometime.    Call the [place] office at [phone] if you have any questions, problems or concerns.

## 2019-11-19 ENCOUNTER — Ambulatory Visit (INDEPENDENT_AMBULATORY_CARE_PROVIDER_SITE_OTHER): Payer: Medicare Other

## 2019-11-19 DIAGNOSIS — E1140 Type 2 diabetes mellitus with diabetic neuropathy, unspecified: Secondary | ICD-10-CM | POA: Diagnosis not present

## 2019-11-19 DIAGNOSIS — Z95810 Presence of automatic (implantable) cardiac defibrillator: Secondary | ICD-10-CM

## 2019-11-19 DIAGNOSIS — M859 Disorder of bone density and structure, unspecified: Secondary | ICD-10-CM | POA: Diagnosis not present

## 2019-11-19 DIAGNOSIS — E7849 Other hyperlipidemia: Secondary | ICD-10-CM | POA: Diagnosis not present

## 2019-11-19 DIAGNOSIS — I5022 Chronic systolic (congestive) heart failure: Secondary | ICD-10-CM

## 2019-11-20 ENCOUNTER — Telehealth: Payer: Self-pay

## 2019-11-20 NOTE — Telephone Encounter (Signed)
Remote ICM transmission received.  Attempted call to patient regarding ICM remote transmission and left detailed message per DPR.  Advised to return call for any fluid symptoms or questions. Next ICM remote transmission scheduled 12/24/2019.

## 2019-11-20 NOTE — Progress Notes (Signed)
EPIC Encounter for ICM Monitoring  Patient Name: KHADIJHA GARRAWAY is a 86 y.o. female Date: 11/20/2019 Primary Care Physican: Shon Baton, MD Primary Cardiologist: Truitt Merle, NP Electrophysiologist: Vergie Living Pacing: 98.1% LastWeight:133lbs    Attempted call to patient and unable to reach.  Left detailed message per DPR regarding transmission. Transmission reviewed.   OptivolThoracic impedancenormal.  Prescribed:Furosemide20 mg take1 tablet (20 mg total) by mouth daily alternating (40 mg) every other day.Potassium 10 mEq1tablet twice a day.  Labs: 07/04/2019 Creatinine 1.0, BUN 14, Potassium 4.3, Sodium 139, GFR 52.1-63.0  Recommendations:Left voice mail with ICM number and encouraged to call if experiencing any fluid symptoms.  Follow-up plan: ICM clinic phone appointment on2/22/2021. 91 day device clinic remote transmission1/28/2021.     Copy of ICM check sent to Dr. Caryl Comes.   3 month ICM trend: 11/19/2019    1 Year ICM trend:       [name], RN 11/20/2019 4:34 PM

## 2019-11-22 DIAGNOSIS — R82998 Other abnormal findings in urine: Secondary | ICD-10-CM | POA: Diagnosis not present

## 2019-11-22 DIAGNOSIS — I130 Hypertensive heart and chronic kidney disease with heart failure and stage 1 through stage 4 chronic kidney disease, or unspecified chronic kidney disease: Secondary | ICD-10-CM | POA: Diagnosis not present

## 2019-11-26 DIAGNOSIS — Z0000 Encounter for general adult medical examination without abnormal findings: Secondary | ICD-10-CM | POA: Diagnosis not present

## 2019-11-26 DIAGNOSIS — Z9189 Other specified personal risk factors, not elsewhere classified: Secondary | ICD-10-CM | POA: Diagnosis not present

## 2019-11-26 DIAGNOSIS — E559 Vitamin D deficiency, unspecified: Secondary | ICD-10-CM | POA: Diagnosis not present

## 2019-11-26 DIAGNOSIS — Z1331 Encounter for screening for depression: Secondary | ICD-10-CM | POA: Diagnosis not present

## 2019-11-26 DIAGNOSIS — D696 Thrombocytopenia, unspecified: Secondary | ICD-10-CM | POA: Diagnosis not present

## 2019-11-28 DIAGNOSIS — Z1212 Encounter for screening for malignant neoplasm of rectum: Secondary | ICD-10-CM | POA: Diagnosis not present

## 2019-11-29 ENCOUNTER — Ambulatory Visit (INDEPENDENT_AMBULATORY_CARE_PROVIDER_SITE_OTHER): Payer: Medicare Other | Admitting: *Deleted

## 2019-11-29 DIAGNOSIS — I472 Ventricular tachycardia: Secondary | ICD-10-CM | POA: Diagnosis not present

## 2019-11-29 DIAGNOSIS — I4729 Other ventricular tachycardia: Secondary | ICD-10-CM

## 2019-11-29 LAB — CUP PACEART REMOTE DEVICE CHECK
Battery Remaining Longevity: 27 mo
Battery Voltage: 2.94 V
Brady Statistic AP VP Percent: 40.63 %
Brady Statistic AP VS Percent: 0.6 %
Brady Statistic AS VP Percent: 57.87 %
Brady Statistic AS VS Percent: 0.91 %
Brady Statistic RA Percent Paced: 40.89 %
Brady Statistic RV Percent Paced: 0.42 %
Date Time Interrogation Session: 20210128012503
HighPow Impedance: 58 Ohm
HighPow Impedance: 79 Ohm
Implantable Lead Implant Date: 20031007
Implantable Lead Implant Date: 20031007
Implantable Lead Implant Date: 20031007
Implantable Lead Implant Date: 20060227
Implantable Lead Location: 753858
Implantable Lead Location: 753859
Implantable Lead Location: 753860
Implantable Lead Location: 753860
Implantable Lead Model: 4193
Implantable Lead Model: 4469
Implantable Lead Model: 4470
Implantable Lead Model: 6949
Implantable Lead Serial Number: 337988
Implantable Lead Serial Number: 361236
Implantable Pulse Generator Implant Date: 20141022
Lead Channel Impedance Value: 361 Ohm
Lead Channel Impedance Value: 4047 Ohm
Lead Channel Impedance Value: 4047 Ohm
Lead Channel Impedance Value: 475 Ohm
Lead Channel Impedance Value: 475 Ohm
Lead Channel Impedance Value: 475 Ohm
Lead Channel Pacing Threshold Amplitude: 0.75 V
Lead Channel Pacing Threshold Amplitude: 0.875 V
Lead Channel Pacing Threshold Amplitude: 1.125 V
Lead Channel Pacing Threshold Pulse Width: 0.4 ms
Lead Channel Pacing Threshold Pulse Width: 0.4 ms
Lead Channel Pacing Threshold Pulse Width: 0.4 ms
Lead Channel Sensing Intrinsic Amplitude: 1.25 mV
Lead Channel Sensing Intrinsic Amplitude: 1.25 mV
Lead Channel Sensing Intrinsic Amplitude: 31.25 mV
Lead Channel Sensing Intrinsic Amplitude: 31.25 mV
Lead Channel Setting Pacing Amplitude: 2 V
Lead Channel Setting Pacing Amplitude: 2 V
Lead Channel Setting Pacing Amplitude: 2.5 V
Lead Channel Setting Pacing Pulse Width: 0.4 ms
Lead Channel Setting Pacing Pulse Width: 0.4 ms
Lead Channel Setting Sensing Sensitivity: 0.6 mV

## 2019-11-29 NOTE — Progress Notes (Signed)
ICD Remote  

## 2019-12-24 ENCOUNTER — Ambulatory Visit (INDEPENDENT_AMBULATORY_CARE_PROVIDER_SITE_OTHER): Payer: Medicare Other

## 2019-12-24 DIAGNOSIS — Z95810 Presence of automatic (implantable) cardiac defibrillator: Secondary | ICD-10-CM

## 2019-12-24 DIAGNOSIS — I5022 Chronic systolic (congestive) heart failure: Secondary | ICD-10-CM | POA: Diagnosis not present

## 2019-12-26 ENCOUNTER — Telehealth: Payer: Self-pay

## 2019-12-26 NOTE — Telephone Encounter (Signed)
Remote ICM transmission received.  Attempted call to patient regarding ICM remote transmission and left detailed message per DPR.  Advised to return call for any fluid symptoms or questions. Next ICM remote transmission scheduled 01/28/2020.

## 2019-12-26 NOTE — Progress Notes (Signed)
EPIC Encounter for ICM Monitoring  Patient Name: Grace Robinson is a 86 y.o. female Date: 12/26/2019 Primary Care Physican: Shon Baton, MD Primary Cardiologist: Truitt Merle, NP Electrophysiologist: Vergie Living Pacing: 98.2% LastWeight:133lbs       Attempted call to patient and unable to reach. Left detailed message per DPR regarding transmission. Transmission reviewed.  OptivolThoracic impedancenormal.  Prescribed:Furosemide20 mg take1 tablet (20 mg total) by mouth daily alternating (40 mg) every other day.Potassium 10 mEq1tablet twice a day.  Labs: 07/04/2019 Creatinine 1.0, BUN 14, Potassium 4.3, Sodium 139, GFR 52.1-63.0  Recommendations:Left voice mail with ICM number and encouraged to call if experiencing any fluid symptoms.  Follow-up plan: ICM clinic phone appointment on3/29/2021. 91 day device clinic remote transmission4/29/2021.   Office visit with Truitt Merle, NP 02/06/2020.  3 month ICM trend: 12/24/2019    1 Year ICM trend:       [name], RN 12/26/2019 1:27 PM

## 2020-01-25 ENCOUNTER — Telehealth: Payer: Self-pay

## 2020-01-25 NOTE — Telephone Encounter (Signed)
Returned call to patient as requested by voice mail message. She reported her right hand went number and unable to use for 10-15 minutes on Wednesday, 3/24 between 5-5:30.  She was unable to hold anything her hand and she expressed that it felt "dead".  The use of her hand returned after the 15 minutes and no further occurrences.  She wonder if it could be related to her heart.  Assisted in sending remote transmission and report shows 1 NSVT episode on 3/11 and 1 NSVT on 2/25.  No other abnormalities or alerts noted.  Advised her to call PCP to discuss and if any further episodes to use ER if needed.  She reports she feels fine at this time and will call PCP today or Monday.  Report suggest fluid levels are normal.  She has office visit with Truitt Merle, NP on 02/06/2020.  She was appreciative of the call back.

## 2020-01-28 ENCOUNTER — Ambulatory Visit (INDEPENDENT_AMBULATORY_CARE_PROVIDER_SITE_OTHER): Payer: Medicare Other

## 2020-01-28 DIAGNOSIS — I5022 Chronic systolic (congestive) heart failure: Secondary | ICD-10-CM | POA: Diagnosis not present

## 2020-01-28 DIAGNOSIS — Z95810 Presence of automatic (implantable) cardiac defibrillator: Secondary | ICD-10-CM | POA: Diagnosis not present

## 2020-01-29 NOTE — Progress Notes (Addendum)
EPIC Encounter for ICM Monitoring  Patient Name: Grace Robinson is a 86 y.o. female Date: 01/29/2020 Primary Care Physican: Shon Baton, MD Primary Cardiologist: Truitt Merle, NP Electrophysiologist: Vergie Living Pacing: 98% LastWeight:133lbs        Transmission reviewed. Patient called on 3/26 and reported numbness in right hand that lasted for 10-15 and was not able to hold anything in her hand during that time.                                                                                                                                Full function of hand returned and no further episodes were reported at time of call.   She has not had any fluid symptoms in last month.  At time of call advised to use ER if needed and she planned to follow up with PCP regarding incident  OptivolThoracic impedancenormal.  Prescribed:Furosemide20 mg take1 tablet (20 mg total) by mouth daily alternating (40 mg) every other day.Potassium 10 mEq1tablet twice a day.  Labs: 07/04/2019 Creatinine 1.0, BUN 14, Potassium 4.3, Sodium 139, GFR 52.1-63.0   Recommendations:None  Follow-up plan: ICM clinic phone appointment on5/12/2019. 91 day device clinic remote transmission4/29/2021.Office visit with Truitt Merle, NP 02/06/2020.  3 month ICM trend: 01/28/2020    1 Year ICM trend:       Grace Billings, RN 01/29/2020 12:35 PM

## 2020-01-30 NOTE — Progress Notes (Signed)
CARDIOLOGY OFFICE NOTE  Date:  02/06/2020    [name] Date of Birth: Sep 23, 1926 Medical Record [id number]  PCP:  Shon Baton, MD  Cardiologist:  Ree Shay   Chief Complaint  Patient presents with  . Follow-up    Seen for Dr. Caryl Comes    History of Present Illness: Grace Robinson is a 86 y.o. female who presents today for a follow up visit. Seen forDr. Percival Spanish and Dr. Caryl Comes. She is a former patient of Dr. Susa Simmonds. Primarily follows with me.   She has an nonischemic CM with remote EF down to 25% that has improved with aggressive medical therapy and CRT-D. Last generator change in 2014. Other issues include HLD, IDDM, ulcerative colitis, LBBB, Meniere's disease and advanced age. Last echo back in 07/2014 showed her EF had normalized at 50 to 55%.   I have seen herover the past several years. She has basically done pretty well from our standpoint.Some occasional issues with swelling/elevated BP but for the most part has done ok. I sent her to see [name] with GI who helped her tremendously with her diarrhea.  I last saw her in the office back in October of 2019 - she was making plans to move to [facility]. She had had a spell of VT that was treated with ATP in August of 2019. Shewasnot interested in having any testing to see if EF has changed. She was to not drive for 6 months.  She had a telehealth visit back in August with Dr. Caryl Comes and then again with me back in early September. She had moved - got shutdown and it was quite an adjustment for her. Cardiac status felt to be ok. She was back driving some - mainly to see her dog "[name]" that she had to give up - fortunately, family took her. We last did a telehealth visit in December - "dying of loneliness". She had had a fall - unclear as to how/why. She did not wish to go to the ER. Not walking as much due to the weather and construction at [facility].   The patient does not have symptoms concerning for COVID-19  infection (fever, chills, cough, or new shortness of breath).   Comes in today. Here alone.  She was so thrilled to come to this visit. Restrictions are being lifted at [facility]. Seeing PCP later this month. She was pretty lonely - now starting to meet other people. She was pretty frazzled with coming here today - the transportation was late getting her - so she was late - BP is up - she does not check BP at home - had a cuff but dropped it on her floor - has not replaced. No chest pain. Breathing is ok. More limited by her knees - worried that she will fall. Thinking about seeing ortho for consideration of injections. She felt like she was doing ok - however towards the end of the visit - she then tells me that on March 24 - she had a spell of where her right hand was "lifeless" and "numb" - she dropped everything she tried to pick up - a cup, a pen, etc. No other symptoms. Her legs were fine. It lasted 10 minutes - has not recurred - she thought she was having a stroke. Since it had not recurred - she was thinking of not telling me. She is only on baby aspirin.   Past Medical History:  Diagnosis Date  . Biventricular  ICD (implantable cardiac defibrillator) in place   . Cellulitis   . Change in bowel habits   . CHF (congestive heart failure) ([place])   . Colon polyps   . Diabetes mellitus    Type II  . Diarrhea   . Dyslipidemia   . HOH (hard of hearing)    left  . HTN (hypertension)   . Ischemic cardiomyopathy   . LBBB (left bundle branch block)    chronic  . Meniere's disease   . T wave oversensing 01/17/2014  . Ulcerative colitis   . Varicose veins    4 lazer  treatment each leg    Past Surgical History:  Procedure Laterality Date  . ABDOMINAL HYSTERECTOMY    . CARDIAC CATHETERIZATION  08/2011  . CHOLECYSTECTOMY    . COLONOSCOPY    . EP Study  08/22/08  . IMPLANTABLE CARDIOVERTER DEFIBRILLATOR GENERATOR CHANGE N/A 08/22/2013   Procedure: IMPLANTABLE CARDIOVERTER DEFIBRILLATOR  GENERATOR CHANGE;  Surgeon: Deboraha Sprang, MD;  Location: [facility] CATH LAB;  Service: Cardiovascular;  Laterality: N/A;  . KNEE ARTHROSCOPY Left 04/14/2016   Procedure: LEFT KNEE ARTHROSCOPY, PARTIAL MEDIAL AND PARTIAL LATERAL MENISCECTOMY, MEDIAL PLICA;  Surgeon: Dorna Leitz, MD;  Location: [facility];  Service: Orthopedics;  Laterality: Left;  . mastoid sugery     in the setting of Meniere's disease  . TONSILLECTOMY AND ADENOIDECTOMY       Medications: Current Meds  Medication Sig  . atorvastatin (LIPITOR) 40 MG tablet Take 40 mg by mouth daily.  . Biotin 5000 MCG CAPS Take 1 capsule by mouth daily.  . Cholecalciferol (VITAMIN D-3) 25 MCG (1000 UT) CAPS Take 2 capsules by mouth daily.   Marland Kitchen dicyclomine (BENTYL) 10 MG capsule Take 1 tab 30 min before meals  . furosemide (LASIX) 20 MG tablet Take 1 tablet (20 mg total) by mouth daily. alternating (40 mg) every other day.  Marland Kitchen HUMALOG MIX 75/25 (75-25) 100 UNIT/ML SUSP injection Inject 22 Units into the skin 2 (two) times daily with a meal.   . metoprolol succinate (TOPROL-XL) 100 MG 24 hr tablet Take 1 tablet by mouth 2 (two) times daily.  Marland Kitchen olmesartan (BENICAR) 40 MG tablet Take 40 mg by mouth daily.  . potassium chloride (K-DUR) 10 MEQ tablet Take 1 tablet by mouth 2 (two) times daily.  . primidone (MYSOLINE) 50 MG tablet Take 1 tablet (50 mg total) by mouth at bedtime.  . vitamin B-12 (CYANOCOBALAMIN) 1000 MCG tablet Take 1,000 mcg by mouth daily.  . vitamin E 400 UNIT capsule Take 400 Units by mouth daily.  . [DISCONTINUED] aspirin EC 81 MG tablet Take 1 tablet (81 mg total) by mouth daily.     Allergies: Allergies  Allergen Reactions  . Carvedilol Other (See Comments)  . Vancomycin Other (See Comments)  . Sudafed [Pseudoephedrine] Other (See Comments)    Makes skin feel like its crawling    Social History: The patient  reports that she has quit smoking. She quit after 1.00 year of use. She has never used smokeless tobacco. She reports  that she does not drink alcohol or use drugs.   Family History: The patient's family history includes Breast cancer in her mother; Emphysema (age of onset: 76) in her father; Healthy in her child; Heart disease in her father; Lung cancer in her mother.   Review of Systems: Please see the history of present illness.   All other systems are reviewed and negative.   Physical Exam: VS:  BP Marland Kitchen)  160/90   Pulse 90   Ht 5' 6.5" (1.689 m)   Wt 135 lb 1.9 oz (61.3 kg)   SpO2 99%   BMI 21.48 kg/m  .  BMI Body mass index is 21.48 kg/m.  Wt Readings from Last 3 Encounters:  02/06/20 135 lb 1.9 oz (61.3 kg)  08/06/19 133 lb 14.4 oz (60.7 kg)  07/03/19 133 lb (60.3 kg)   BP was 180/90 by me.  General: Alert and in no acute distress.   Cardiac: Regular rate and rhythm. No murmurs, rubs, or gallops. No edema.  Respiratory:  Lungs are clear to auscultation bilaterally with normal work of breathing.  GI: Soft and nontender.  MS: No deformity or atrophy. Gait and ROM intact. She is using a cane. Knees are swollen.  Skin: Warm and dry. Color is normal.  Neuro:  Strength and sensation are intact and no gross focal deficits noted.  Psych: Alert, appropriate and with normal affect.   LABORATORY DATA:  EKG:  EKG is not ordered today.    Lab Results  Component Value Date   WBC 6.3 08/08/2018   HGB 13.2 08/08/2018   HCT 39.0 08/08/2018   PLT 152 08/08/2018   GLUCOSE 112 (H) 08/08/2018   ALT 19 07/30/2016   AST 22 07/30/2016   NA 146 (H) 08/08/2018   K 3.8 08/08/2018   CL 106 08/08/2018   CREATININE 0.83 08/08/2018   BUN 13 08/08/2018   CO2 22 08/08/2018   TSH 1.23 07/20/2011   INR 1.1 (H) 08/13/2013       BNP (last 3 results) No results for input(s): BNP in the last 8760 hours.  ProBNP (last 3 results) No results for input(s): PROBNP in the last 8760 hours.   Other Studies Reviewed Today:  Echo Study Conclusions from 07/2014  - Left ventricle: The cavity size was normal.  Systolic function was normal. The estimated ejection fraction was in the range of 50% to 55%. There was an increased relative contribution of atrial contraction to ventricular filling. Doppler parameters are consistent with abnormal left ventricular relaxation (grade 1 diastolic dysfunction). - Ventricular septum: Septal motion showed paradox. These changes are consistent with right ventricular pacing. - Aortic valve: There was mild regurgitation. - Mitral valve: There is a calcified mobile density in the LV cavity most consistent with calcified redundant chordae tendinae. This was present on echo in 2014. There was mild regurgitation. - Pulmonic valve: There was trivial regurgitation.     ASSESSMENT & PLAN:    1. Probable TIA/stroke like symptoms - worrisome - she is agreeable to CT of the head and carotid dopplers. Does not wish to see neuro at this time - aspirin increased to full dose.   2. HTN - BP is high here today - she was pretty frazzled/flustered with coming today due to her transportation being late. She is going to get the staff from [facility] to check her BP and we will get an update.   3. NICM - EF recovered by echo from 2015 - cardiac status seems stable.   4. Underlying CRT -D - followed by EP  5. Situational stress - the pandemic has been very hard - she was placed on lock down the day she moved to [facility] a little over one year ago.   6. COVID-19 Education: The signs and symptoms of COVID-19 were discussed with the patient and how to seek care for testing (follow up with PCP or arrange E-visit).  The importance  of social distancing, staying at home, hand hygiene and wearing a mask when out in public were discussed today.  Current medicines are reviewed with the patient today.  The patient does not have concerns regarding medicines other than what has been noted above.  The following changes have been made:  See above.  Labs/ tests  ordered today include:    Orders Placed This Encounter  Procedures  . CT Head Wo Contrast  . VAS US CAROTID     Disposition:   FU with me in about 3 to 4 months. We will be in touch next week to see what her BP is.   Patient is agreeable to this plan and will call if any problems develop in the interim.   SignedTruitt Merle, NP  02/06/2020 4:31 PM  [facility] [address] [place], [place]  [address] Phone: [phone] Fax: [phone]

## 2020-02-01 ENCOUNTER — Telehealth: Payer: Self-pay | Admitting: Nurse Practitioner

## 2020-02-01 NOTE — Telephone Encounter (Signed)
Patient returning Pat's call 

## 2020-02-01 NOTE — Telephone Encounter (Signed)
Left message to call office

## 2020-02-01 NOTE — Telephone Encounter (Signed)
New message   Patient states that her appt with Truitt Merle was scheduled for 1:30 pm and she did not get a message to change the appt and wants to come into the office at 1:30 pm due to she can't get transportation at 2:45 pm. Please call to discuss.

## 2020-02-01 NOTE — Telephone Encounter (Signed)
I spoke with patient. She did not get message regarding change in appointment time on 4/7. She has to arrange transportation through [facility].  She will check to see if transportation can change time so she can be at 2:45 appointment. She will call back with an update.

## 2020-02-01 NOTE — Telephone Encounter (Signed)
Patient called back. She was able to change transportation and will be at appointment on 4/7 at 2:45.

## 2020-02-05 ENCOUNTER — Other Ambulatory Visit: Payer: Self-pay

## 2020-02-05 MED ORDER — PRIMIDONE 50 MG PO TABS: 50.0000 mg | ORAL_TABLET | Freq: Every day | ORAL | 0 refills | Status: DC

## 2020-02-06 ENCOUNTER — Ambulatory Visit: Payer: Medicare Other | Admitting: Nurse Practitioner

## 2020-02-06 ENCOUNTER — Encounter: Payer: Self-pay | Admitting: Nurse Practitioner

## 2020-02-06 ENCOUNTER — Other Ambulatory Visit: Payer: Self-pay

## 2020-02-06 VITALS — BP 160/90 | HR 90 | Ht 66.5 in | Wt 135.1 lb

## 2020-02-06 DIAGNOSIS — I428 Other cardiomyopathies: Secondary | ICD-10-CM | POA: Diagnosis not present

## 2020-02-06 DIAGNOSIS — Z7189 Other specified counseling: Secondary | ICD-10-CM

## 2020-02-06 DIAGNOSIS — I10 Essential (primary) hypertension: Secondary | ICD-10-CM

## 2020-02-06 DIAGNOSIS — I5022 Chronic systolic (congestive) heart failure: Secondary | ICD-10-CM | POA: Diagnosis not present

## 2020-02-06 DIAGNOSIS — G459 Transient cerebral ischemic attack, unspecified: Secondary | ICD-10-CM

## 2020-02-06 DIAGNOSIS — I472 Ventricular tachycardia, unspecified: Secondary | ICD-10-CM

## 2020-02-06 MED ORDER — ASPIRIN 325 MG PO TABS: 325.0000 mg | ORAL_TABLET | Freq: Every day | ORAL | Status: DC

## 2020-02-06 NOTE — Patient Instructions (Addendum)
After Visit Summary:  We will be checking the following labs today - NONE   Medication Instructions:    Continue with your current medicines.    If you need a refill on your cardiac medications before your next appointment, please call your pharmacy.     Testing/Procedures To Be Arranged:  Carotid dopplers  CT of the Head without contrast  Follow-Up:   See me in about 3 to 4 months    At [facility], you and your health needs are our priority.  As part of our continuing mission to provide you with exceptional heart care, we have created designated Provider Care Teams.  These Care Teams include your primary Cardiologist (physician) and Advanced Practice Providers (APPs -  Physician Assistants and Nurse Practitioners) who all work together to provide you with the care you need, when you need it.  Special Instructions:  . Stay safe, stay home, wash your hands for at least 20 seconds and wear a mask when out in public.  . It was good to talk with you today.    Call the [place] office at [phone] if you have any questions, problems or concerns.

## 2020-02-11 DIAGNOSIS — L570 Actinic keratosis: Secondary | ICD-10-CM | POA: Diagnosis not present

## 2020-02-11 DIAGNOSIS — Z85828 Personal history of other malignant neoplasm of skin: Secondary | ICD-10-CM | POA: Diagnosis not present

## 2020-02-11 DIAGNOSIS — L821 Other seborrheic keratosis: Secondary | ICD-10-CM | POA: Diagnosis not present

## 2020-02-11 DIAGNOSIS — D1801 Hemangioma of skin and subcutaneous tissue: Secondary | ICD-10-CM | POA: Diagnosis not present

## 2020-02-12 ENCOUNTER — Telehealth: Payer: Self-pay | Admitting: *Deleted

## 2020-02-12 NOTE — Telephone Encounter (Signed)
These look good.  Will keep on her current regimen.   Cecille Rubin

## 2020-02-12 NOTE — Telephone Encounter (Signed)
S/w pt to check bp readings, bp readings are as follows:   Apr 8    BP  126/80 Apr 9    BP  140/76 Apr 12  BP   130/70  Stated bp readings looked good and if Cecille Rubin wanted to change something will call pt back.  Will send to Cecille Rubin to advise.

## 2020-02-14 ENCOUNTER — Other Ambulatory Visit: Payer: Self-pay

## 2020-02-14 ENCOUNTER — Ambulatory Visit (HOSPITAL_COMMUNITY)
Admission: RE | Admit: 2020-02-14 | Discharge: 2020-02-14 | Disposition: A | Discharge status: Home / Self Care | Payer: Medicare Other | Source: Ambulatory Visit | Attending: Nurse Practitioner | Admitting: Nurse Practitioner

## 2020-02-14 DIAGNOSIS — G459 Transient cerebral ischemic attack, unspecified: Secondary | ICD-10-CM | POA: Diagnosis not present

## 2020-02-19 ENCOUNTER — Other Ambulatory Visit: Payer: Self-pay

## 2020-02-19 ENCOUNTER — Ambulatory Visit (HOSPITAL_COMMUNITY)
Admission: RE | Admit: 2020-02-19 | Discharge: 2020-02-19 | Disposition: A | Discharge status: Home / Self Care | Payer: Medicare Other | Source: Ambulatory Visit | Attending: Cardiovascular Disease | Admitting: Cardiovascular Disease

## 2020-02-19 DIAGNOSIS — G459 Transient cerebral ischemic attack, unspecified: Secondary | ICD-10-CM

## 2020-02-28 ENCOUNTER — Ambulatory Visit (INDEPENDENT_AMBULATORY_CARE_PROVIDER_SITE_OTHER): Payer: Medicare Other | Admitting: *Deleted

## 2020-02-28 DIAGNOSIS — I472 Ventricular tachycardia: Secondary | ICD-10-CM

## 2020-02-28 DIAGNOSIS — I4729 Other ventricular tachycardia: Secondary | ICD-10-CM

## 2020-02-28 LAB — CUP PACEART REMOTE DEVICE CHECK
Battery Remaining Longevity: 25 mo
Battery Voltage: 2.93 V
Brady Statistic AP VP Percent: 55.29 %
Brady Statistic AP VS Percent: 0.86 %
Brady Statistic AS VP Percent: 43.16 %
Brady Statistic AS VS Percent: 0.7 %
Brady Statistic RA Percent Paced: 55.86 %
Brady Statistic RV Percent Paced: 0.18 %
Date Time Interrogation Session: 20210429022605
HighPow Impedance: 56 Ohm
HighPow Impedance: 80 Ohm
Implantable Lead Implant Date: 20031007
Implantable Lead Implant Date: 20031007
Implantable Lead Implant Date: 20031007
Implantable Lead Implant Date: 20060227
Implantable Lead Location: 753858
Implantable Lead Location: 753859
Implantable Lead Location: 753860
Implantable Lead Location: 753860
Implantable Lead Model: 4193
Implantable Lead Model: 4469
Implantable Lead Model: 4470
Implantable Lead Model: 6949
Implantable Lead Serial Number: 337988
Implantable Lead Serial Number: 361236
Implantable Pulse Generator Implant Date: 20141022
Lead Channel Impedance Value: 361 Ohm
Lead Channel Impedance Value: 4047 Ohm
Lead Channel Impedance Value: 4047 Ohm
Lead Channel Impedance Value: 475 Ohm
Lead Channel Impedance Value: 475 Ohm
Lead Channel Impedance Value: 513 Ohm
Lead Channel Pacing Threshold Amplitude: 0.75 V
Lead Channel Pacing Threshold Amplitude: 0.875 V
Lead Channel Pacing Threshold Amplitude: 0.875 V
Lead Channel Pacing Threshold Pulse Width: 0.4 ms
Lead Channel Pacing Threshold Pulse Width: 0.4 ms
Lead Channel Pacing Threshold Pulse Width: 0.4 ms
Lead Channel Sensing Intrinsic Amplitude: 1.25 mV
Lead Channel Sensing Intrinsic Amplitude: 1.25 mV
Lead Channel Sensing Intrinsic Amplitude: 29.625 mV
Lead Channel Sensing Intrinsic Amplitude: 29.625 mV
Lead Channel Setting Pacing Amplitude: 2 V
Lead Channel Setting Pacing Amplitude: 2 V
Lead Channel Setting Pacing Amplitude: 2.5 V
Lead Channel Setting Pacing Pulse Width: 0.4 ms
Lead Channel Setting Pacing Pulse Width: 0.4 ms
Lead Channel Setting Sensing Sensitivity: 0.6 mV

## 2020-02-29 NOTE — Progress Notes (Signed)
ICD Remote  

## 2020-03-03 ENCOUNTER — Ambulatory Visit (INDEPENDENT_AMBULATORY_CARE_PROVIDER_SITE_OTHER): Payer: Medicare Other

## 2020-03-03 DIAGNOSIS — I5022 Chronic systolic (congestive) heart failure: Secondary | ICD-10-CM | POA: Diagnosis not present

## 2020-03-03 DIAGNOSIS — Z95810 Presence of automatic (implantable) cardiac defibrillator: Secondary | ICD-10-CM

## 2020-03-04 ENCOUNTER — Telehealth: Payer: Self-pay

## 2020-03-04 NOTE — Progress Notes (Signed)
EPIC Encounter for ICM Monitoring  Patient Name: HABY ROURKE is a 86 y.o. female Date: 03/04/2020 Primary Care Physican: Shon Baton, MD Primary Cardiologist: Truitt Merle, NP Electrophysiologist: Vergie Living Pacing: 98% LastWeight:133lbs   Attempted call to patient and unable to reach.  Left detailed message per DPR regarding transmission. Transmission reviewed.   OptivolThoracic impedancenormal.  Prescribed:Furosemide20 mg take1 tablet (20 mg total) by mouth daily alternating (40 mg) every other day.Potassium 10 mEq1tablet twice a day.  Labs: 07/04/2019 Creatinine 1.0, BUN 14, Potassium 4.3, Sodium 139, GFR 52.1-63.0   Recommendations:Left voice mail with ICM number and encouraged to call if experiencing any fluid symptoms.  Follow-up plan: ICM clinic phone appointment on6/05/2020. 91 day device clinic remote transmission7/29/2021.   Copy sent to Dr Caryl Comes.  3 month ICM trend: 03/04/2020    1 Year ICM trend:       [name], RN 03/04/2020 4:57 PM

## 2020-03-04 NOTE — Telephone Encounter (Signed)
Remote ICM transmission received.  Attempted call to patient regarding ICM remote transmission and left detailed message per DPR regarding transmission.  Advised to return call for any fluid symptoms or questions. Next ICM remote transmission scheduled 04/07/2020.

## 2020-03-27 DIAGNOSIS — E1122 Type 2 diabetes mellitus with diabetic chronic kidney disease: Secondary | ICD-10-CM | POA: Diagnosis not present

## 2020-03-27 DIAGNOSIS — N1831 Chronic kidney disease, stage 3a: Secondary | ICD-10-CM | POA: Diagnosis not present

## 2020-03-27 DIAGNOSIS — I130 Hypertensive heart and chronic kidney disease with heart failure and stage 1 through stage 4 chronic kidney disease, or unspecified chronic kidney disease: Secondary | ICD-10-CM | POA: Diagnosis not present

## 2020-03-27 DIAGNOSIS — Z8673 Personal history of transient ischemic attack (TIA), and cerebral infarction without residual deficits: Secondary | ICD-10-CM | POA: Diagnosis not present

## 2020-04-07 ENCOUNTER — Ambulatory Visit (INDEPENDENT_AMBULATORY_CARE_PROVIDER_SITE_OTHER): Payer: Medicare Other

## 2020-04-07 DIAGNOSIS — I5022 Chronic systolic (congestive) heart failure: Secondary | ICD-10-CM

## 2020-04-07 DIAGNOSIS — Z95810 Presence of automatic (implantable) cardiac defibrillator: Secondary | ICD-10-CM

## 2020-04-08 ENCOUNTER — Telehealth: Payer: Self-pay

## 2020-04-08 NOTE — Progress Notes (Signed)
EPIC Encounter for ICM Monitoring  Patient Name: Grace Robinson is a 86 y.o. female Date: 04/08/2020 Primary Care Physican: Shon Baton, MD Primary Cardiologist: Truitt Merle, NP Electrophysiologist: Vergie Living Pacing: 98.1% LastWeight:133lbs   Attempted call to patient and unable to reach.  Left detailed message per DPR regarding transmission. Transmission reviewed.   OptivolThoracic impedancenormal.  Prescribed:Furosemide20 mg take1 tablet (20 mg total) by mouth daily alternating (40 mg) every other day.Potassium 10 mEq1tablet twice a day.  Labs: 07/04/2019 Creatinine 1.0, BUN 14, Potassium 4.3, Sodium 139, GFR 52.1-63.0  Recommendations:Left voice mail with ICM number and encouraged to call if experiencing any fluid symptoms.  Follow-up plan: ICM clinic phone appointment on7/10/2020. 91 day device clinic remote transmission7/29/2021.   Copy of ICM check sent to Dr. Caryl Comes.   3 month ICM trend: 04/07/2020    1 Year ICM trend:       [name], RN 04/08/2020 1:28 PM

## 2020-04-08 NOTE — Telephone Encounter (Signed)
Remote ICM transmission received.  Attempted call to patient regarding ICM remote transmission and left detailed message per DPR.  Advised to return call for any fluid symptoms or questions. Next ICM remote transmission scheduled 05/12/2020.     

## 2020-05-02 DIAGNOSIS — M6281 Muscle weakness (generalized): Secondary | ICD-10-CM | POA: Diagnosis not present

## 2020-05-02 DIAGNOSIS — R2681 Unsteadiness on feet: Secondary | ICD-10-CM | POA: Diagnosis not present

## 2020-05-02 DIAGNOSIS — Z9181 History of falling: Secondary | ICD-10-CM | POA: Diagnosis not present

## 2020-05-02 DIAGNOSIS — R29898 Other symptoms and signs involving the musculoskeletal system: Secondary | ICD-10-CM | POA: Diagnosis not present

## 2020-05-12 ENCOUNTER — Ambulatory Visit (INDEPENDENT_AMBULATORY_CARE_PROVIDER_SITE_OTHER): Payer: Medicare Other

## 2020-05-12 DIAGNOSIS — Z95810 Presence of automatic (implantable) cardiac defibrillator: Secondary | ICD-10-CM | POA: Diagnosis not present

## 2020-05-12 DIAGNOSIS — I5022 Chronic systolic (congestive) heart failure: Secondary | ICD-10-CM | POA: Diagnosis not present

## 2020-05-13 ENCOUNTER — Telehealth: Payer: Self-pay

## 2020-05-13 NOTE — Progress Notes (Signed)
EPIC Encounter for ICM Monitoring  Patient Name: Grace Robinson is a 86 y.o. female Date: 05/13/2020 Primary Care Physican: Shon Baton, MD Primary Cardiologist: Truitt Merle, NP Electrophysiologist: Vergie Living Pacing: 97.8% LastWeight:133lbs   Attempted call to patient and unable to reach. Left detailed message per DPR regarding transmission. Transmission reviewed.  OptivolThoracic impedancenormal.  Prescribed: Furosemide20 mg take1 tablet (20 mg total) by mouth daily alternating (40 mg) every other day. Potassium 10 mEq1tablet twice a day.  Labs: 07/04/2019 Creatinine 1.0, BUN 14, Potassium 4.3, Sodium 139, GFR 52.1-63.0  Recommendations:Left voice mail with ICM number and encouraged to call if experiencing any fluid symptoms.  Follow-up plan: ICM clinic phone appointment on8/16/2021. 91 day device clinic remote transmission7/29/2021.   EP/Cardiology Office Visits: 06/25/2020 with Truitt Merle, NP.    Copy of ICM check sent to Dr. Caryl Comes.   3 month ICM trend: 05/12/2020    1 Year ICM trend:       [name], RN 05/13/2020 4:14 PM

## 2020-05-13 NOTE — Telephone Encounter (Signed)
Remote ICM transmission received.  Attempted call to patient regarding ICM remote transmission and left detailed message per DPR.  Advised to return call for any fluid symptoms or questions. Next ICM remote transmission scheduled 06/16/2020.

## 2020-05-20 ENCOUNTER — Other Ambulatory Visit: Payer: Self-pay | Admitting: Neurology

## 2020-05-20 NOTE — Telephone Encounter (Signed)
Rx(s) sent to pharmacy electronically.  

## 2020-05-22 ENCOUNTER — Telehealth: Payer: Self-pay | Admitting: Physician Assistant

## 2020-05-22 MED ORDER — DICYCLOMINE HCL 10 MG PO CAPS: ORAL_CAPSULE | ORAL | 0 refills | Status: DC

## 2020-05-22 NOTE — Telephone Encounter (Signed)
Refill has been sent to the [facility] on [address].

## 2020-05-26 NOTE — Telephone Encounter (Signed)
[employer] called to notify approval for Dicyclomine good for 1 year. Caller will be faxing over approval information.  [place]

## 2020-05-29 ENCOUNTER — Ambulatory Visit (INDEPENDENT_AMBULATORY_CARE_PROVIDER_SITE_OTHER): Payer: Medicare Other | Admitting: *Deleted

## 2020-05-29 DIAGNOSIS — I428 Other cardiomyopathies: Secondary | ICD-10-CM

## 2020-05-29 LAB — CUP PACEART REMOTE DEVICE CHECK
Battery Remaining Longevity: 23 mo
Battery Voltage: 2.93 V
Brady Statistic AP VP Percent: 43.84 %
Brady Statistic AP VS Percent: 0.7 %
Brady Statistic AS VP Percent: 54.59 %
Brady Statistic AS VS Percent: 0.87 %
Brady Statistic RA Percent Paced: 44.41 %
Brady Statistic RV Percent Paced: 0.3 %
Date Time Interrogation Session: 20210729001703
HighPow Impedance: 50 Ohm
HighPow Impedance: 69 Ohm
Implantable Lead Implant Date: 20031007
Implantable Lead Implant Date: 20031007
Implantable Lead Implant Date: 20031007
Implantable Lead Implant Date: 20060227
Implantable Lead Location: 753858
Implantable Lead Location: 753859
Implantable Lead Location: 753860
Implantable Lead Location: 753860
Implantable Lead Model: 4193
Implantable Lead Model: 4469
Implantable Lead Model: 4470
Implantable Lead Model: 6949
Implantable Lead Serial Number: 337988
Implantable Lead Serial Number: 361236
Implantable Pulse Generator Implant Date: 20141022
Lead Channel Impedance Value: 342 Ohm
Lead Channel Impedance Value: 4047 Ohm
Lead Channel Impedance Value: 4047 Ohm
Lead Channel Impedance Value: 456 Ohm
Lead Channel Impedance Value: 456 Ohm
Lead Channel Impedance Value: 475 Ohm
Lead Channel Pacing Threshold Amplitude: 0.75 V
Lead Channel Pacing Threshold Amplitude: 1 V
Lead Channel Pacing Threshold Amplitude: 1 V
Lead Channel Pacing Threshold Pulse Width: 0.4 ms
Lead Channel Pacing Threshold Pulse Width: 0.4 ms
Lead Channel Pacing Threshold Pulse Width: 0.4 ms
Lead Channel Sensing Intrinsic Amplitude: 1.25 mV
Lead Channel Sensing Intrinsic Amplitude: 1.25 mV
Lead Channel Sensing Intrinsic Amplitude: 28.875 mV
Lead Channel Sensing Intrinsic Amplitude: 28.875 mV
Lead Channel Setting Pacing Amplitude: 2 V
Lead Channel Setting Pacing Amplitude: 2 V
Lead Channel Setting Pacing Amplitude: 2.5 V
Lead Channel Setting Pacing Pulse Width: 0.4 ms
Lead Channel Setting Pacing Pulse Width: 0.4 ms
Lead Channel Setting Sensing Sensitivity: 0.6 mV

## 2020-06-02 NOTE — Progress Notes (Signed)
Remote ICD transmission.   

## 2020-06-16 ENCOUNTER — Ambulatory Visit (INDEPENDENT_AMBULATORY_CARE_PROVIDER_SITE_OTHER): Payer: Medicare Other

## 2020-06-16 DIAGNOSIS — Z95810 Presence of automatic (implantable) cardiac defibrillator: Secondary | ICD-10-CM

## 2020-06-16 DIAGNOSIS — I5022 Chronic systolic (congestive) heart failure: Secondary | ICD-10-CM | POA: Diagnosis not present

## 2020-06-16 NOTE — Progress Notes (Deleted)
CARDIOLOGY OFFICE NOTE  Date:  06/16/2020    [name] Date of Birth: 1926/06/26 Medical Record #[id number]  PCP:  Shon Baton, MD  Cardiologist:  Ree Shay  No chief complaint on file.   History of Present Illness: LIYAT FAULKENBERRY is a 86 y.o. female who presents today for a follow up visit.  Seen forDr. Percival Spanish and Dr. Caryl Comes. She is a former patient of Dr. Susa Simmonds. Primarily follows with me.   She has an nonischemic CM with remote EF down to 25% that has improved with aggressive medical therapy and CRT-D. Last generator change in 2014. Other issues include HLD, IDDM, ulcerative colitis, LBBB, Meniere's disease and advanced age. Last echo back in 07/2014 showed her EF had normalized at 50 to 55%.   I have seen herover the past several years. She has basically done pretty well from our standpoint.Some occasional issues with swelling/elevated BP but for the most part has done ok. I sent her to see [name] with GI who helped her tremendously with her diarrhea.  I last saw her inthe office back inOctoberof 2019- she was making plans to move to [facility]. She had had a spell of VT that was treated with ATP in August of 2019. Shewasnot interested in having any testing to see if EF has changed. She was to not drive for 6 months. She ended up moving to [facility] - got shutdown the day she moved it and it was quite an adjustment for her. I was able to see her back here in the office in April - the pandemic had been very hard - she felt like she had been doing ok but at the end of the visit she reported to me a spell she had had back in march where her right hand was "lifeless" and "numb" - she was dropping everything she tried to pick up - lasted about 10 minutes. She thought she was having a stroke. This had not recurred. She admitted to almost not telling me. She agreed to having CT and carotids - I increased her aspirin to full dose - she declined to see  neurology.  Comes in today. Here with   Past Medical History:  Diagnosis Date  . Biventricular ICD (implantable cardiac defibrillator) in place   . Cellulitis   . Change in bowel habits   . CHF (congestive heart failure) ([place])   . Colon polyps   . Diabetes mellitus    Type II  . Diarrhea   . Dyslipidemia   . HOH (hard of hearing)    left  . HTN (hypertension)   . Ischemic cardiomyopathy   . LBBB (left bundle branch block)    chronic  . Meniere's disease   . T wave oversensing 01/17/2014  . Ulcerative colitis   . Varicose veins    4 lazer  treatment each leg    Past Surgical History:  Procedure Laterality Date  . ABDOMINAL HYSTERECTOMY    . CARDIAC CATHETERIZATION  08/2011  . CHOLECYSTECTOMY    . COLONOSCOPY    . EP Study  08/22/08  . IMPLANTABLE CARDIOVERTER DEFIBRILLATOR GENERATOR CHANGE N/A 08/22/2013   Procedure: IMPLANTABLE CARDIOVERTER DEFIBRILLATOR GENERATOR CHANGE;  Surgeon: Deboraha Sprang, MD;  Location: [facility] CATH LAB;  Service: Cardiovascular;  Laterality: N/A;  . KNEE ARTHROSCOPY Left 04/14/2016   Procedure: LEFT KNEE ARTHROSCOPY, PARTIAL MEDIAL AND PARTIAL LATERAL MENISCECTOMY, MEDIAL PLICA;  Surgeon: Dorna Leitz, MD;  Location: [facility];  Service:  Orthopedics;  Laterality: Left;  . mastoid sugery     in the setting of Meniere's disease  . TONSILLECTOMY AND ADENOIDECTOMY       Medications: No outpatient medications have been marked as taking for the 06/25/20 encounter (Appointment) with Burtis Junes, NP.     Allergies: Allergies  Allergen Reactions  . Carvedilol Other (See Comments)  . Vancomycin Other (See Comments)  . Sudafed [Pseudoephedrine] Other (See Comments)    Makes skin feel like its crawling    Social History: The patient  reports that she has quit smoking. She quit after 1.00 year of use. She has never used smokeless tobacco. She reports that she does not drink alcohol and does not use drugs.   Family History: The patient's ***family  history includes Breast cancer in her mother; Emphysema (age of onset: 4) in her father; Healthy in her child; Heart disease in her father; Lung cancer in her mother.   Review of Systems: Please see the history of present illness.   All other systems are reviewed and negative.   Physical Exam: VS:  There were no vitals taken for this visit. Marland Kitchen  BMI There is no height or weight on file to calculate BMI.  Wt Readings from Last 3 Encounters:  02/06/20 135 lb 1.9 oz (61.3 kg)  08/06/19 133 lb 14.4 oz (60.7 kg)  07/03/19 133 lb (60.3 kg)    General: Pleasant. Well developed, well nourished and in no acute distress.   HEENT: Normal.  Neck: Supple, no JVD, carotid bruits, or masses noted.  Cardiac: ***Regular rate and rhythm. No murmurs, rubs, or gallops. No edema.  Respiratory:  Lungs are clear to auscultation bilaterally with normal work of breathing.  GI: Soft and nontender.  MS: No deformity or atrophy. Gait and ROM intact.  Skin: Warm and dry. Color is normal.  Neuro:  Strength and sensation are intact and no gross focal deficits noted.  Psych: Alert, appropriate and with normal affect.   LABORATORY DATA:  EKG:  EKG {ACTION; IS/IS EZM:[id number]} ordered today.  Personally reviewed by me. This demonstrates ***.  Lab Results  Component Value Date   WBC 6.3 08/08/2018   HGB 13.2 08/08/2018   HCT 39.0 08/08/2018   PLT 152 08/08/2018   GLUCOSE 112 (H) 08/08/2018   ALT 19 07/30/2016   AST 22 07/30/2016   NA 146 (H) 08/08/2018   K 3.8 08/08/2018   CL 106 08/08/2018   CREATININE 0.83 08/08/2018   BUN 13 08/08/2018   CO2 22 08/08/2018   TSH 1.23 07/20/2011   INR 1.1 (H) 08/13/2013     BNP (last 3 results) No results for input(s): BNP in the last 8760 hours.  ProBNP (last 3 results) No results for input(s): PROBNP in the last 8760 hours.   Other Studies Reviewed Today:  Carotid Doppler Summary 01/2020:  Right Carotid: Velocities in the right ICA are consistent with a  1-39%  stenosis.   Left Carotid: Velocities in the left ICA are consistent with a 1-39%  stenosis.   Vertebrals: Bilateral vertebral arteries demonstrate antegrade flow.  Subclavians: Normal flow hemodynamics were seen in bilateral subclavian        arteries.   *See table(s) above for measurements and observations.  Electronically signed by Ida Rogue MD on 02/19/2020 at 6:09:32 PM.    CT HEAD IMPRESSION 01/2020: 1. No acute intracranial abnormality. If there is persisting clinical concern for acute/subacute ischemia, MRI could be obtained. 2. Moderate parenchymal volume loss  and chronic microvascular angiopathy. 3. Prior left mastoidectomy.   Electronically Signed   By: Lovena Le M.D.   Echo Study Conclusions from 07/2014  - Left ventricle: The cavity size was normal. Systolic function was normal. The estimated ejection fraction was in the range of 50% to 55%. There was an increased relative contribution of atrial contraction to ventricular filling. Doppler parameters are consistent with abnormal left ventricular relaxation (grade 1 diastolic dysfunction). - Ventricular septum: Septal motion showed paradox. These changes are consistent with right ventricular pacing. - Aortic valve: There was mild regurgitation. - Mitral valve: There is a calcified mobile density in the LV cavity most consistent with calcified redundant chordae tendinae. This was present on echo in 2014. There was mild regurgitation. - Pulmonic valve: There was trivial regurgitation.     ASSESSMENT & PLAN:    1. Probable TIA/stroke like symptoms - worrisome - she is agreeable to CT of the head and carotid dopplers. Does not wish to see neuro at this time - aspirin increased to full dose.   2. HTN - BP is high here today - she was pretty frazzled/flustered with coming today due to her transportation being late. She is going to get the staff from [facility] to  check her BP and we will get an update.   3. NICM - EF recovered by echo from 2015 - cardiac status seems stable.   4. Underlying CRT -D - followed by EP  5. Situational stress - the pandemic has been very hard - she was placed on lock down the day she moved to [facility] a little over one year ago.     Current medicines are reviewed with the patient today.  The patient does not have concerns regarding medicines other than what has been noted above.  The following changes have been made:  See above.  Labs/ tests ordered today include:   No orders of the defined types were placed in this encounter.    Disposition:   FU with *** in {gen number 1-60:109323} {Days to years:10300}.   Patient is agreeable to this plan and will call if any problems develop in the interim.   SignedTruitt Merle, NP  06/16/2020 7:43 AM  [facility] [address] [place], [place]  [address] Phone: [phone] Fax: [phone]

## 2020-06-18 NOTE — Progress Notes (Signed)
EPIC Encounter for ICM Monitoring  Patient Name: Grace Robinson is a 86 y.o. female Date: 06/18/2020 Primary Care Physican: Shon Baton, MD Primary Cardiologist: Truitt Merle, NP Electrophysiologist: Vergie Living Pacing: 98.2% LastWeight:133lbs  Monitored VT (Off)  VT-NS (>4 beats, >130 bpm) 3 High Rate-NS 0 SVT: VT/VF Rx Withheld 1   Spoke with patient and reports feeling well at this time.  Denies fluid symptoms.    OptivolThoracic impedancenormal.  Prescribed:  Furosemide20 mg take1 tablet (20 mg total) by mouth daily alternating (40 mg) every other day.  Potassium 10 mEq1tablet twice a day.  Labs: 07/04/2019 Creatinine 1.0, BUN 14, Potassium 4.3, Sodium 139, GFR 52.1-63.0  Recommendations:No changes and encouraged to call if experiencing any fluid symptoms.  Follow-up plan: ICM clinic phone appointment on9/20/2021. 91 day device clinic remote transmission10/28/2021.   EP/Cardiology Office Visits: 06/25/2020 with Truitt Merle, NP.  07/03/2020 with Dr Caryl Comes (telehealth visit).   Copy of ICM check sent to Dr. Caryl Comes.   3 month ICM trend: 06/16/2020    1 Year ICM trend:       [name], RN 06/18/2020 10:20 AM

## 2020-06-20 ENCOUNTER — Telehealth: Payer: Self-pay | Admitting: Nurse Practitioner

## 2020-06-20 NOTE — Telephone Encounter (Signed)
The patient requests to have a phone visit next week. She will have vital signs ready when she is called.    Patient Consent for Virtual Visit         Grace Robinson has provided verbal consent on 06/20/2020 for a virtual visit (video or telephone).   CONSENT FOR VIRTUAL VISIT FOR:  Grace Robinson  By participating in this virtual visit I agree to the following:  I hereby voluntarily request, consent and authorize [facility] and its employed or contracted physicians, physician assistants, nurse practitioners or other licensed health care professionals (the Practitioner), to provide me with telemedicine health care services (the "Services") as deemed necessary by the treating Practitioner. I acknowledge and consent to receive the Services by the Practitioner via telemedicine. I understand that the telemedicine visit will involve communicating with the Practitioner through live audiovisual communication technology and the disclosure of certain medical information by electronic transmission. I acknowledge that I have been given the opportunity to request an in-person assessment or other available alternative prior to the telemedicine visit and am voluntarily participating in the telemedicine visit.  I understand that I have the right to withhold or withdraw my consent to the use of telemedicine in the course of my care at any time, without affecting my right to future care or treatment, and that the Practitioner or I may terminate the telemedicine visit at any time. I understand that I have the right to inspect all information obtained and/or recorded in the course of the telemedicine visit and may receive copies of available information for a reasonable fee.  I understand that some of the potential risks of receiving the Services via telemedicine include:  Marland Kitchen Delay or interruption in medical evaluation due to technological equipment failure or disruption; . Information transmitted may not be  sufficient (e.g. poor resolution of images) to allow for appropriate medical decision making by the Practitioner; and/or  . In rare instances, security protocols could fail, causing a breach of personal health information.  Furthermore, I acknowledge that it is my responsibility to provide information about my medical history, conditions and care that is complete and accurate to the best of my ability. I acknowledge that Practitioner's advice, recommendations, and/or decision may be based on factors not within their control, such as incomplete or inaccurate data provided by me or distortions of diagnostic images or specimens that may result from electronic transmissions. I understand that the practice of medicine is not an exact science and that Practitioner makes no warranties or guarantees regarding treatment outcomes. I acknowledge that a copy of this consent can be made available to me via my patient portal ([facility]), or I can request a printed copy by calling the office of [facility].    I understand that my insurance will be billed for this visit.   I have read or had this consent read to me. . I understand the contents of this consent, which adequately explains the benefits and risks of the Services being provided via telemedicine.  . I have been provided ample opportunity to ask questions regarding this consent and the Services and have had my questions answered to my satisfaction. . I give my informed consent for the services to be provided through the use of telemedicine in my medical care

## 2020-06-20 NOTE — Telephone Encounter (Signed)
New Message  Patient is calling in reference to her appt on 8/25 she states that the appt was to be virtual. Not showing any notes. Please advise.

## 2020-06-23 NOTE — Progress Notes (Signed)
Telehealth Visit     Virtual Visit via Telephone Note   This visit type was conducted due to national recommendations for restrictions regarding Grace COVID-19 Pandemic (e.g. social distancing) in an effort to limit this patient's exposure and mitigate transmission in our community.  Due to her co-morbid illnesses, this patient is at least at moderate risk for complications without adequate follow up.  This format is felt to be most appropriate for this patient at this time.  Grace patient did not have access to video technology/had technical difficulties with video requiring transitioning to audio format only (telephone).  All issues noted in this document were discussed and addressed.  No physical exam could be performed with this format.  Please refer to Grace patient's chart for her  consent to telehealth for [place].   Evaluation Performed:  Follow-up visit   Grace patient was identified using 2 identifiers.   This visit type was conducted due to national recommendations for restrictions regarding Grace COVID-19 Pandemic (e.g. social distancing).  This format is felt to be most appropriate for this patient at this time.  All issues noted in this document were discussed and addressed.  No physical exam was performed (except for noted visual exam findings with Video Visits).  Please refer to Grace patient's chart ([place] message for video visits and phone note for telephone visits) for Grace patient's consent to telehealth for [place].  Date:  06/25/2020   ID:  Grace [place], DOB 1925-12-19, MRN [id number]  Patient Location:  [place]  Provider location:   [place]  PCP:  Grace Baton, MD  Cardiologist:  [place] Electrophysiologist:  [place]  Chief Complaint:  Follow up  History of Present Illness:    Grace [place] is a 86 y.o. female who presents via audio/video conferencing for a telehealth visit today.  Seen forDr. Percival Robinson and Dr. Caryl Robinson. She is a former  patient of Dr. Susa Simmonds. Primarily follows with me.   She has an nonischemic CM with remote EF down to 25% that has improved with aggressive medical therapy and CRT-D. Last generator change in 2014. Other issues include HLD, IDDM, ulcerative colitis, LBBB, Meniere's disease and advanced age. Last echo back in 07/2014 showed her EF had normalized at 50 to 55%.   I have seen herover Grace past several years. She has basically done pretty well from our standpoint.Some occasional issues with swelling/elevated BP but for Grace most part has done ok. I sent her to see [place] with GI who helped her tremendously with her diarrhea.  When seen inthe [place] back inOctoberof 2019- she was making plans to move to [place]. She had had a spell of VT that was treated with ATP in August of 2019. Shewasnot interested in having any testing to see if EF had changed. She was to not drive for 6 months. She did end up moving to [place] - got shutdown that same day and it was quite an adjustment for her. She has had prior fall - unclear as to how/why. She did not wish to go to [place] ER.    I was able see her back in April here in [place] - restrictions were being lifted. She has been vaccinated. She was starting to meet Grace other residents. BP was up but she felt like it was due to rushing to get here. At Grace end of Grace visit she told me of a spell on March 24 - she had a spell  of where her right hand was "lifeless" and "numb" - she dropped everything she tried to pick up - a cup, a pen, etc. No other symptoms. Her legs were fine. It lasted 10 minutes - has not recurred - she thought she was having a stroke. Since it had not recurred - she was thinking of not telling me. She is only on baby aspirin. We ended up getting carotids updated and a CT - no acute findings - I increased her to a full dose of aspirin. She did not wish to see neurology.   Seen today by telephone call. Declined video. She has consented for  this visit. She is quite bored. Now having to mask indoors. Now having boxed meals 2 nights a week due to no staff. She has gained weight - due to not doing anything. No falls. Going tomorrow to renew her driving license. Her balance is an issue and getting worse - she is very much aware of this when out. No chest pain. Breathing seems ok. She is not happy with her weight. Does have about 2 more years of battery life on her ICD.   Past Medical History:  Diagnosis Date  . Biventricular ICD (implantable cardiac defibrillator) in place   . Cellulitis   . Change in bowel habits   . CHF (congestive heart failure) ([place])   . Colon polyps   . Diabetes mellitus    Type II  . Diarrhea   . Dyslipidemia   . HOH (hard of hearing)    left  . HTN (hypertension)   . Ischemic cardiomyopathy   . LBBB (left bundle branch block)    chronic  . Meniere's disease   . T wave oversensing 01/17/2014  . Ulcerative colitis   . Varicose veins    4 lazer  treatment each leg   Past Surgical History:  Procedure Laterality Date  . ABDOMINAL HYSTERECTOMY    . CARDIAC CATHETERIZATION  08/2011  . CHOLECYSTECTOMY    . COLONOSCOPY    . EP Study  08/22/08  . IMPLANTABLE CARDIOVERTER DEFIBRILLATOR GENERATOR CHANGE N/A 08/22/2013   Procedure: IMPLANTABLE CARDIOVERTER DEFIBRILLATOR GENERATOR CHANGE;  Surgeon: Deboraha Sprang, MD;  Location: [facility] CATH LAB;  Service: Cardiovascular;  Laterality: N/A;  . KNEE ARTHROSCOPY Left 04/14/2016   Procedure: LEFT KNEE ARTHROSCOPY, PARTIAL MEDIAL AND PARTIAL LATERAL MENISCECTOMY, MEDIAL PLICA;  Surgeon: Dorna Leitz, MD;  Location: [facility];  Service: Orthopedics;  Laterality: Left;  . mastoid sugery     in Grace setting of Meniere's disease  . TONSILLECTOMY AND ADENOIDECTOMY       Current Meds  Medication Sig  . aspirin (ASPIRIN ADULT) 325 MG tablet Take 1 tablet (325 mg total) by mouth daily.  Marland Kitchen atorvastatin (LIPITOR) 40 MG tablet Take 40 mg by mouth daily.  . Biotin 5000 MCG CAPS  Take 1 capsule by mouth daily.  . Cholecalciferol (VITAMIN D-3) 25 MCG (1000 UT) CAPS Take 2 capsules by mouth daily.   Marland Kitchen dicyclomine (BENTYL) 10 MG capsule Take 1 tab 30 min before meals  . furosemide (LASIX) 20 MG tablet Take 1 tablet (20 mg total) by mouth daily. alternating (40 mg) every other day.  Marland Kitchen HUMALOG MIX 75/25 (75-25) 100 UNIT/ML SUSP injection Inject 22 Units into Grace skin 2 (two) times daily with a meal.   . metoprolol succinate (TOPROL-XL) 100 MG 24 hr tablet Take 1 tablet by mouth 2 (two) times daily.  Marland Kitchen olmesartan (BENICAR) 40 MG tablet Take 40 mg by  mouth daily.  . potassium chloride (K-DUR) 10 MEQ tablet Take 1 tablet by mouth 2 (two) times daily.  . primidone (MYSOLINE) 50 MG tablet TAKE 1 TABLET(50 MG) BY MOUTH AT BEDTIME  . vitamin B-12 (CYANOCOBALAMIN) 1000 MCG tablet Take 1,000 mcg by mouth daily.  . vitamin E 400 UNIT capsule Take 400 Units by mouth daily.     Allergies:   Carvedilol, Vancomycin, and Sudafed [pseudoephedrine]   Social History   Tobacco Use  . Smoking status: Former Smoker    Years: 1.00  . Smokeless tobacco: Never Used  . Tobacco comment: quit in 1970  Vaping Use  . Vaping Use: Never used  Substance Use Topics  . Alcohol use: No  . Drug use: No     Family Hx: Grace patient's family history includes Breast cancer in her mother; Emphysema (age of onset: 5) in her father; Healthy in her child; Heart disease in her father; Lung cancer in her mother. There is no history of Colon cancer, Rectal cancer, Stomach cancer, Liver cancer, or Esophageal cancer.  ROS:   Please see Grace history of present illness.   All other systems reviewed are negative except for weight gain.    Objective:    Vital Signs:  BP (!) 142/80 (BP Location: Left Arm, Patient Position: Sitting, Cuff Size: Normal)   Pulse 85   Ht 5' 6.5" (1.689 m)   Wt 140 lb (63.5 kg)   BMI 22.26 kg/m    Wt Readings from Last 3 Encounters:  06/25/20 140 lb (63.5 kg)  02/06/20 135 lb  1.9 oz (61.3 kg)  08/06/19 133 lb 14.4 oz (60.7 kg)    Alert female in no acute distress. She sounds good. Not short of breath.    Labs/Other Tests and Data Reviewed:    Lab Results  Component Value Date   WBC 6.3 08/08/2018   HGB 13.2 08/08/2018   HCT 39.0 08/08/2018   PLT 152 08/08/2018   GLUCOSE 112 (H) 08/08/2018   ALT 19 07/30/2016   AST 22 07/30/2016   NA 146 (H) 08/08/2018   K 3.8 08/08/2018   CL 106 08/08/2018   CREATININE 0.83 08/08/2018   BUN 13 08/08/2018   CO2 22 08/08/2018   TSH 1.23 07/20/2011   INR 1.1 (H) 08/13/2013     BNP (last 3 results) No results for input(s): BNP in Grace last 8760 hours.  ProBNP (last 3 results) No results for input(s): PROBNP in Grace last 8760 hours.    Prior CV studies:    Grace following studies were reviewed today:  Carotid Summary April 2021:  Right Carotid: Velocities in Grace right ICA are consistent with a 1-39%  stenosis.   Left Carotid: Velocities in Grace left ICA are consistent with a 1-39%  stenosis.   Vertebrals: Bilateral vertebral arteries demonstrate antegrade flow.  Subclavians: Normal flow hemodynamics were seen in bilateral subclavian arteries.   *See table(s) above for measurements and observations.    CT HEAD IMPRESSION 01/2020: 1. No acute intracranial abnormality. If there is persisting clinical concern for acute/subacute ischemia, MRI could be obtained. 2. Moderate parenchymal volume loss and chronic microvascular angiopathy. 3. Prior left mastoidectomy.  Electronically Signed   By: Lovena Le M.D.   On: 02/14/2020 23:51   Echo Study Conclusions from 07/2014  - Left ventricle: Grace cavity size was normal. Systolic function was normal. Grace estimated ejection fraction was in Grace range of 50% to 55%. There was an increased relative contribution of atrial  contraction to ventricular filling. Doppler parameters are consistent with abnormal left ventricular relaxation (grade  1 diastolic dysfunction). - Ventricular septum: Septal motion showed paradox. These changes are consistent with right ventricular pacing. - Aortic valve: There was mild regurgitation. - Mitral valve: There is a calcified mobile density in Grace LV cavity most consistent with calcified redundant chordae tendinae. This was present on echo in 2014. There was mild regurgitation. - Pulmonic valve: There was trivial regurgitation.    ASSESSMENT & PLAN:   1. NICM - with recovery of her EF by her last echo from 2015 - no worrisome symptoms.   2. Prior probable TIA - on full dose aspirin. Declined to see neurology - this does not sound like it has recurred.   3. HTN - BP is acceptable - no changes made today.   4. Underlying CRT-D - followed by EP.   5. Advancing age - seems to be holding her own - Grace pandemic has made things hard since her move to [facility].    Patient Risk:   After full review of this patient's clinical status, I feel that they are at least moderate risk at this time.  Time:   Today, I have spent 10 minutes with Grace patient with telehealth technology discussing Grace above issues.     Medication Adjustments/Labs and Tests Ordered: Current medicines are reviewed at length with Grace patient today.  Concerns regarding medicines are outlined above.   Tests Ordered: No orders of Grace defined types were placed in this encounter.   Medication Changes: No orders of Grace defined types were placed in this encounter.   Disposition:  FU with me in 4 months.     Patient is agreeable to this plan and will call if any problems develop in Grace interim.   [name], NP  06/25/2020 11:40 AM    [facility]

## 2020-06-23 NOTE — Progress Notes (Deleted)
{Choose 1 Note Type (Video or Telephone):[phone]}    Date:  06/23/2020   ID:  Grace [place], DOB 1926/03/20, MRN [id number] The patient was identified using 2 identifiers.  {Patient Location:[phone]::"Home"} {Provider Location:[phone]::"Home Office"}  PCP:  Shon Baton, MD  Cardiologist:  No primary care provider on file. *** Electrophysiologist:  None   Evaluation Performed:  {Choose Visit Type:[phone]::"Follow-Up Visit"}  Chief Complaint:  ***  History of Present Illness:    Grace [place] is a 86 y.o. female with ***  The patient {does/does not:200015} have symptoms concerning for COVID-19 infection (fever, chills, cough, or new shortness of breath).    Past Medical History:  Diagnosis Date  . Biventricular ICD (implantable cardiac defibrillator) in place   . Cellulitis   . Change in bowel habits   . CHF (congestive heart failure) ([place])   . Colon polyps   . Diabetes mellitus    Type II  . Diarrhea   . Dyslipidemia   . HOH (hard of hearing)    left  . HTN (hypertension)   . Ischemic cardiomyopathy   . LBBB (left bundle branch block)    chronic  . Meniere's disease   . T wave oversensing 01/17/2014  . Ulcerative colitis   . Varicose veins    4 lazer  treatment each leg   Past Surgical History:  Procedure Laterality Date  . ABDOMINAL HYSTERECTOMY    . CARDIAC CATHETERIZATION  08/2011  . CHOLECYSTECTOMY    . COLONOSCOPY    . EP Study  08/22/08  . IMPLANTABLE CARDIOVERTER DEFIBRILLATOR GENERATOR CHANGE N/A 08/22/2013   Procedure: IMPLANTABLE CARDIOVERTER DEFIBRILLATOR GENERATOR CHANGE;  Surgeon: Deboraha Sprang, MD;  Location[facility] CATH LAB;  Service: Cardiovascular;  Laterality: N/A;  . KNEE ARTHROSCOPY Left 04/14/2016   Procedure: LEFT KNEE ARTHROSCOPY, PARTIAL MEDIAL AND PARTIAL LATERAL MENISCECTOMY, MEDIAL PLICA;  Surgeon: Dorna Leitz, MD;  Location: [facility];  Service: Orthopedics;  Laterality: Left;  . mastoid sugery     in the setting of Meniere's  disease  . TONSILLECTOMY AND ADENOIDECTOMY       No outpatient medications have been marked as taking for the 06/25/20 encounter (Appointment) with Burtis Junes, NP.     Allergies:   Carvedilol, Vancomycin, and Sudafed [pseudoephedrine]   Social History   Tobacco Use  . Smoking status: Former Smoker    Years: 1.00  . Smokeless tobacco: Never Used  . Tobacco comment: quit in 1970  Vaping Use  . Vaping Use: Never used  Substance Use Topics  . Alcohol use: No  . Drug use: No     Family Hx: The patient's family history includes Breast cancer in her mother; Emphysema (age of onset: 54) in her father; Healthy in her child; Heart disease in her father; Lung cancer in her mother. There is no history of Colon cancer, Rectal cancer, Stomach cancer, Liver cancer, or Esophageal cancer.  ROS:   Please see the history of present illness.    *** All other systems reviewed and are negative.   Prior CV studies:   The following studies were reviewed today:  ***  Labs/Other Tests and Data Reviewed:    EKG:  {EKG/Telemetry Strips Reviewed:[phone]}  Recent Labs: No results found for requested labs within last 8760 hours.   Recent Lipid Panel No results found for: CHOL, TRIG, HDL, CHOLHDL, LDLCALC, LDLDIRECT  Wt Readings from Last 3 Encounters:  02/06/20 135 lb 1.9 oz (61.3 kg)  08/06/19 133 lb 14.4 oz (60.7 kg)  07/03/19 133 lb (60.3 kg)     Objective:    Vital Signs:  There were no vitals taken for this visit.   {HeartCare Virtual Exam (Optional):[phone]::"VITAL SIGNS:  reviewed"}  ASSESSMENT & PLAN:    1. ***  COVID-19 Education: The signs and symptoms of COVID-19 were discussed with the patient and how to seek care for testing (follow up with PCP or arrange E-visit).  ***The importance of social distancing was discussed today.  Time:   Today, I have spent *** minutes with the patient with telehealth technology discussing the above problems.     Medication  Adjustments/Labs and Tests Ordered: Current medicines are reviewed at length with the patient today.  Concerns regarding medicines are outlined above.   Tests Ordered: No orders of the defined types were placed in this encounter.   Medication Changes: No orders of the defined types were placed in this encounter.   Follow Up:  {F/U [phone]} {follow up:15908}  [name], NP  06/23/2020 12:19 PM    [facility]

## 2020-06-25 ENCOUNTER — Encounter: Payer: Self-pay | Admitting: Nurse Practitioner

## 2020-06-25 ENCOUNTER — Other Ambulatory Visit: Payer: Self-pay

## 2020-06-25 ENCOUNTER — Telehealth (INDEPENDENT_AMBULATORY_CARE_PROVIDER_SITE_OTHER): Payer: Medicare Other | Admitting: Nurse Practitioner

## 2020-06-25 VITALS — BP 142/80 | HR 85 | Ht 66.5 in | Wt 140.0 lb

## 2020-06-25 DIAGNOSIS — I428 Other cardiomyopathies: Secondary | ICD-10-CM

## 2020-06-25 DIAGNOSIS — I5022 Chronic systolic (congestive) heart failure: Secondary | ICD-10-CM

## 2020-06-25 DIAGNOSIS — Z95810 Presence of automatic (implantable) cardiac defibrillator: Secondary | ICD-10-CM

## 2020-06-25 DIAGNOSIS — G459 Transient cerebral ischemic attack, unspecified: Secondary | ICD-10-CM | POA: Diagnosis not present

## 2020-06-25 NOTE — Patient Instructions (Addendum)
After Visit Summary:  We will be checking the following labs today - NONE   Medication Instructions:    Continue with your current medicines.    If you need a refill on your cardiac medications before your next appointment, please call your pharmacy.     Testing/Procedures To Be Arranged:  N/A  Follow-Up:   See me in 4 months - we will try for an in person visit - but if you need to change to virtual - just let me know.     At [facility], you and your health needs are our priority.  As part of our continuing mission to provide you with exceptional heart care, we have created designated Provider Care Teams.  These Care Teams include your primary Cardiologist (physician) and Advanced Practice Providers (APPs -  Physician Assistants and Nurse Practitioners) who all work together to provide you with the care you need, when you need it.  Special Instructions:  . Stay safe, wash your hands for at least 20 seconds and wear a mask when needed.  . It was so good to talk with you today!!   Call the [place] office at [phone] if you have any questions, problems or concerns.

## 2020-07-03 ENCOUNTER — Telehealth (INDEPENDENT_AMBULATORY_CARE_PROVIDER_SITE_OTHER): Payer: Medicare Other | Admitting: Internal Medicine

## 2020-07-03 ENCOUNTER — Other Ambulatory Visit: Payer: Self-pay

## 2020-07-03 ENCOUNTER — Encounter: Payer: Self-pay | Admitting: Internal Medicine

## 2020-07-03 VITALS — BP 146/82 | HR 82 | Ht 66.0 in | Wt 140.0 lb

## 2020-07-03 DIAGNOSIS — I428 Other cardiomyopathies: Secondary | ICD-10-CM

## 2020-07-03 DIAGNOSIS — I472 Ventricular tachycardia, unspecified: Secondary | ICD-10-CM

## 2020-07-03 DIAGNOSIS — I5022 Chronic systolic (congestive) heart failure: Secondary | ICD-10-CM

## 2020-07-03 NOTE — Progress Notes (Signed)
Electrophysiology TeleHealth Note   Due to national recommendations of social distancing due to COVID 19, an audio/video telehealth visit is felt to be most appropriate for this patient at this time.  See MyChart message from today for the patient's consent to telehealth for [facility].   Date:  07/03/2020   ID:  Grace [place], DOB 12/28/1925, MRN [id number]  Location: patient's home  Provider location: [address], [place]  Evaluation Performed: Follow-up visit  PCP:  Shon Baton, MD  Cardiologist:  [place] Electrophysiologist:  SK   Chief Complaint:   VT  History of Present Illness:    Grace [place] is a 86 y.o. female who presents via audio/video conferencing for a telehealth visit today.  The patient did not have access to video technology/had technical difficulties with video requiring transitioning to audio format only (telephone). .    Since last being seen in our clinic for resolved NICM and VT s/p CRT-D with interval ATP having recently moved into [facility] and isolated, the patient reports things have been better and getting adjusted  The patient denies chest pain, shortness of breath, nocturnal dyspnea, orthopnea or peripheral edema.  There have been no palpitations, lightheadedness or syncope.  *  Walking daily     Date Cr K Hgb  10/19 0.83 3.8 13.2   1/21 0.9 3.8 15   DATE TEST EF   6/09 Echo   55-65 %   9/15 Echo   55-65 %           The patient denies symptoms of fevers, chills, cough, or new SOB worrisome for COVID 19.    Past Medical History:  Diagnosis Date   Biventricular ICD (implantable cardiac defibrillator) in place    Cellulitis    Change in bowel habits    CHF (congestive heart failure) ([facility])    Colon polyps    Diabetes mellitus    Type II   Diarrhea    Dyslipidemia    HOH (hard of hearing)    left   HTN (hypertension)    Ischemic cardiomyopathy    LBBB (left bundle branch block)    chronic    Meniere's disease    T wave oversensing 01/17/2014   Ulcerative colitis    Varicose veins    4 lazer  treatment each leg    Past Surgical History:  Procedure Laterality Date   ABDOMINAL HYSTERECTOMY     CARDIAC CATHETERIZATION  08/2011   CHOLECYSTECTOMY     COLONOSCOPY     EP Study  08/22/08   IMPLANTABLE CARDIOVERTER DEFIBRILLATOR GENERATOR CHANGE N/A 08/22/2013   Procedure: IMPLANTABLE CARDIOVERTER DEFIBRILLATOR GENERATOR CHANGE;  Surgeon: Deboraha Sprang, MD;  Location: [facility] CATH LAB;  Service: Cardiovascular;  Laterality: N/A;   KNEE ARTHROSCOPY Left 04/14/2016   Procedure: LEFT KNEE ARTHROSCOPY, PARTIAL MEDIAL AND PARTIAL LATERAL MENISCECTOMY, MEDIAL PLICA;  Surgeon: Dorna Leitz, MD;  Location: [facility];  Service: Orthopedics;  Laterality: Left;   mastoid sugery     in the setting of Meniere's disease   TONSILLECTOMY AND ADENOIDECTOMY      Current Outpatient Medications  Medication Sig Dispense Refill   acetaminophen (TYLENOL) 500 MG tablet Take 500 mg by mouth as needed for mild pain.     aspirin (ASPIRIN ADULT) 325 MG tablet Take 1 tablet (325 mg total) by mouth daily.     atorvastatin (LIPITOR) 40 MG tablet Take 40 mg by mouth daily.  3  Biotin 5000 MCG CAPS Take 1 capsule by mouth daily.     Cholecalciferol (VITAMIN D-3) 25 MCG (1000 UT) CAPS Take 2 capsules by mouth daily.      dicyclomine (BENTYL) 10 MG capsule Take 1 tab 30 min before meals 270 capsule 0   furosemide (LASIX) 20 MG tablet Take 1 tablet (20 mg total) by mouth daily. alternating (40 mg) every other day. 60 tablet 11   HUMALOG MIX 75/25 (75-25) 100 UNIT/ML SUSP injection Inject 32 Units into the skin 2 (two) times daily with a meal.   1   metoprolol succinate (TOPROL-XL) 100 MG 24 hr tablet Take 1 tablet by mouth 2 (two) times daily.     olmesartan (BENICAR) 40 MG tablet Take 40 mg by mouth daily.  3   potassium chloride (K-DUR) 10 MEQ tablet Take 1 tablet by mouth 2 (two) times daily.      primidone (MYSOLINE) 50 MG tablet TAKE 1 TABLET(50 MG) BY MOUTH AT BEDTIME (Patient taking differently: Take 25 mg by mouth at bedtime. ) 90 tablet 0   vitamin B-12 (CYANOCOBALAMIN) 1000 MCG tablet Take 1,000 mcg by mouth daily.     vitamin E 400 UNIT capsule Take 400 Units by mouth daily.     No current facility-administered medications for this visit.    Allergies:   Carvedilol, Vancomycin, and Sudafed [pseudoephedrine]   Social History:  The patient  reports that she has quit smoking. She quit after 1.00 year of use. She has never used smokeless tobacco. She reports that she does not drink alcohol and does not use drugs.   Family History:  The patient's   family history includes Breast cancer in her mother; Emphysema (age of onset: 52) in her father; Healthy in her child; Heart disease in her father; Lung cancer in her mother.   ROS:  Please see the history of present illness.   All other systems are personally reviewed and negative.    Exam:    Vital Signs:  BP (!) 146/82    Pulse 82    Ht 5\' 6"  (1.676 m)    Wt 140 lb (63.5 kg)    BMI 22.60 kg/m     Labs/Other Tests and Data Reviewed:    Recent Labs: No results found for requested labs within last 8760 hours.   Wt Readings from Last 3 Encounters:  07/03/20 140 lb (63.5 kg)  06/25/20 140 lb (63.5 kg)  02/06/20 135 lb 1.9 oz (61.3 kg)     Other studies personally reviewed: Additional studies/ records that were reviewed today include: As above   R  Last device remote is reviewed from [facility] PDF dated 7/21 which reveals normal device function,   arrhythmias - none    ASSESSMENT & PLAN:    Nonischemic cardiomyopathy -resolved  Congestive heart failure ? HFpEF  CRT-D-[employer]  Hypertension   Ventricular tachycardia  2019/20 Rx with ATP   No intercurrent Ventricular tachycardia  Euvolemic continue current meds; functional status is great  BP is reasonably controlled       COVID 19 screen The patient  denies symptoms of COVID 19 at this time.  The importance of social distancing was discussed today.  Follow-up:  94m Next remote:As Scheduled    Current medicines are reviewed at length with the patient today.   The patient has no* concerns regarding her medicines.  The following changes were made today: none  Labs/ tests ordered today include:    No orders of the  defined types were placed in this encounter.   Future tests ( post COVID )    thsPatient Risk:  after full review of this patients clinical status, I feel that they are at moderate risk at this time.  Today, I have spent 9 minutes with the patient with telehealth technology discussing the above.  Signed, Virl Axe, MD  07/03/2020 3:49 PM     [facility] [address] [place] [address] [phone] (office) [phone] (fax)

## 2020-07-03 NOTE — Patient Instructions (Signed)

## 2020-07-17 ENCOUNTER — Encounter: Payer: Self-pay | Admitting: Physician Assistant

## 2020-07-17 ENCOUNTER — Ambulatory Visit: Payer: Medicare Other | Admitting: Physician Assistant

## 2020-07-17 VITALS — BP 140/72 | HR 70 | Ht 66.5 in | Wt 142.0 lb

## 2020-07-17 DIAGNOSIS — K580 Irritable bowel syndrome with diarrhea: Secondary | ICD-10-CM | POA: Diagnosis not present

## 2020-07-17 MED ORDER — DICYCLOMINE HCL 10 MG PO CAPS: 10.0000 mg | ORAL_CAPSULE | Freq: Three times a day (TID) | ORAL | 11 refills | Status: DC

## 2020-07-17 MED ORDER — CHOLESTYRAMINE 4 G PO PACK: 4.0000 g | PACK | Freq: Every day | ORAL | 11 refills | Status: DC

## 2020-07-17 NOTE — Progress Notes (Signed)
Subjective:    Patient ID: Grace Robinson, female    DOB: 11-16-1925, 86 y.o.   MRN: [id number]  HPI [name] is a very pleasant 86 year old white female, known to Dr. Rush Landmark and myself.  She has history of IBS-D, possible component of bile salt induced diarrhea.  Also with history of hypertension, nonischemic cardiomyopathy and insulin-dependent diabetes mellitus. She comes in today for annual follow-up and medication refills. She has relocated to friends home, she says this past year has been difficult as they had been isolated secondary to Covid.  She is able to be more active over the past few months and more activities available. She continues to do well with a combination of Questran 4 g once daily and dicyclomine 10 mg AC.  She has very occasional spells of urgency and diarrhea that may last for 2 days and occur maybe once every 3 to 4 months.  She uses Imodium as needed during those episodes. No current complaints of abdominal pain, no melena or hematochezia.  Appetite has been fine, weight has been stable. She is asking about timing of Covid booster and influenza vaccine.  Review of Systems Pertinent positive and negative review of systems were noted in the above HPI section.  All other review of systems was otherwise negative.  Outpatient Encounter Medications as of 07/17/2020  Medication Sig  . acetaminophen (TYLENOL) 500 MG tablet Take 500 mg by mouth as needed for mild pain.  Marland Kitchen aspirin (ASPIRIN ADULT) 325 MG tablet Take 1 tablet (325 mg total) by mouth daily.  Marland Kitchen atorvastatin (LIPITOR) 40 MG tablet Take 40 mg by mouth daily.  . Biotin 5000 MCG CAPS Take 1 capsule by mouth daily.  . Cholecalciferol (VITAMIN D-3) 25 MCG (1000 UT) CAPS Take 2 capsules by mouth daily.   Marland Kitchen dicyclomine (BENTYL) 10 MG capsule Take 1 capsule (10 mg total) by mouth 3 (three) times daily before meals. Take 1 tab 30 min before meals  . furosemide (LASIX) 20 MG tablet Take 1 tablet (20 mg total) by mouth  daily. alternating (40 mg) every other day.  Marland Kitchen HUMALOG MIX 75/25 (75-25) 100 UNIT/ML SUSP injection Inject 32 Units into the skin 2 (two) times daily with a meal.   . metoprolol succinate (TOPROL-XL) 100 MG 24 hr tablet Take 1 tablet by mouth 2 (two) times daily.  Marland Kitchen olmesartan (BENICAR) 40 MG tablet Take 40 mg by mouth daily.  . potassium chloride (K-DUR) 10 MEQ tablet Take 1 tablet by mouth 2 (two) times daily.  . primidone (MYSOLINE) 50 MG tablet TAKE 1 TABLET(50 MG) BY MOUTH AT BEDTIME (Patient taking differently: Take 25 mg by mouth at bedtime. )  . vitamin B-12 (CYANOCOBALAMIN) 1000 MCG tablet Take 1,000 mcg by mouth daily.  . vitamin E 400 UNIT capsule Take 400 Units by mouth daily.  . [DISCONTINUED] dicyclomine (BENTYL) 10 MG capsule Take 1 tab 30 min before meals  . cholestyramine (QUESTRAN) 4 g packet Take 1 packet (4 g total) by mouth daily.   No facility-administered encounter medications on file as of 07/17/2020.   Allergies  Allergen Reactions  . Carvedilol Other (See Comments)  . Vancomycin Other (See Comments)  . Sudafed [Pseudoephedrine] Other (See Comments)    Makes skin feel like its crawling   Patient Active Problem List   Diagnosis Date Noted  . Degenerative tear of posterior horn of medial meniscus of left knee 04/14/2016  . Tear of lateral meniscus of left knee 04/14/2016  . Synovial  plica of left knee 94/70/9628  . T wave oversensing 01/17/2014  . Fall 07/10/2013  . Paroxysmal ventricular tachycardia ([place]) 12/13/2012  . Biventricular implantable cardioverter-defibrillator -Medtronic 12/08/2011  . Atrial tachycardia ([place]) 12/08/2011  . HTN (hypertension) 05/19/2011  . CARDIOMYOPATHY, PRIMARY, DILATED 12/01/2010   Social History   Socioeconomic History  . Marital status: Single    Spouse name: Not on file  . Number of children: 2  . Years of education: Not on file  . Highest education level: Bachelor's degree (e.g., BA, AB, BS)  Occupational History  .  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 Smoking status: Former Smoker    Years: 1.00  . Smokeless tobacco: Never Used  . Tobacco comment: quit in 1970  Vaping Use  . Vaping Use: Never used  Substance and Sexual Activity  . Alcohol use: No  . Drug use: No  . Sexual activity: Not Currently    Birth control/protection: Post-menopausal  Other Topics Concern  . Not on file  Social History Narrative   Widowed, has 2 children.    Social Determinants of Health   Financial Resource Strain:   . Difficulty of Paying Living Expenses: Not on file  Food Insecurity:   . Worried About Charity fundraiser in the Last Year: Not on file  . Ran Out of Food in the Last Year: Not on file  Transportation Needs:   . Lack of Transportation (Medical): Not on file  . Lack of Transportation (Non-Medical): Not on file  Physical Activity:   . Days of Exercise per Week: Not on file  . Minutes of Exercise per Session: Not on file  Stress:   . Feeling of Stress : Not on file  Social Connections:   . Frequency of Communication with Friends and Family: Not on file  . Frequency of Social Gatherings with Friends and Family: Not on file  . Attends Religious Services: Not on file  . Active Member of Clubs or Organizations: Not on file  . Attends Archivist Meetings: Not on file  . Marital Status: Not on file  Intimate Partner Violence:   . Fear of Current or Ex-Partner: Not on file  . Emotionally Abused: Not on file  . Physically Abused: Not on file  . Sexually Abused: Not on file    Ms. Sturtevant's family history includes Breast cancer in her mother; Emphysema (age of onset: 4) in her father; Healthy in her child; Heart disease in her father; Lung cancer in her mother.      Objective:    Vitals:   07/17/20 1417  BP: 140/72  Pulse: 70    Physical Exam Well-developed well-nourished elderly white female in no acute distress.  Very pleasant height, Weight, 142  BMI 22.5  HEENT; nontraumatic normocephalic, EOMI, PE RR LA, sclera anicteric. Oropharynx; not examined Neck; supple, no JVD Cardiovascular; regular rate and rhythm with S1-S2, no murmur rub or gallop Pulmonary; Clear bilaterally Abdomen; soft, nontender, nondistended, no palpable mass or hepatosplenomegaly, bowel sounds are active Rectal; not done today Skin; benign exam, no jaundice rash or appreciable lesions Extremities; no clubbing cyanosis or edema skin warm and dry Neuro/Psych; alert and oriented x4, grossly nonfocal mood and affect appropriate       Assessment & Plan:   #69 86 year old white female with IBS-D, possible component of bile salt induced diarrhea. Symptoms well controlled on current regimen. 2.  Status post cholecystectomy 3.  Insulin-dependent diabetes  mellitus 4.  History of nonischemic cardiomyopathy 5.  Hypertension  Plan; refill Questran 4 g once daily to be taken 2 hours away from any other medications-refills x1 year Refill dicyclomine 10 mg p.o. AC #90/11 refills Continue Imodium as needed. Patient will follow up with Dr. Silverio Decamp  or myself in 1 year or sooner as needed.  Amal Saiki Genia Harold PA-C 07/17/2020   Cc: Shon Baton, MD

## 2020-07-17 NOTE — Progress Notes (Signed)
IBS

## 2020-07-17 NOTE — Patient Instructions (Addendum)
If you are age 84 or older, your body mass index should be between 23-30. Your Body mass index is 22.58 kg/m. If this is out of the aforementioned range listed, please consider follow up with your Primary Care Provider.  If you are age 81 or younger, your body mass index should be between 19-25. Your Body mass index is 22.58 kg/m. If this is out of the aformentioned range listed, please consider follow up with your Primary Care Provider.   Refills of Bentyl and Questran have been sent to your pharmacy.  Continue Imodium as needed for episodes of Diarrhea.  Follow up as needed.

## 2020-07-21 ENCOUNTER — Ambulatory Visit (INDEPENDENT_AMBULATORY_CARE_PROVIDER_SITE_OTHER): Payer: Medicare Other

## 2020-07-21 DIAGNOSIS — Z95810 Presence of automatic (implantable) cardiac defibrillator: Secondary | ICD-10-CM | POA: Diagnosis not present

## 2020-07-21 DIAGNOSIS — I5022 Chronic systolic (congestive) heart failure: Secondary | ICD-10-CM

## 2020-07-25 NOTE — Progress Notes (Signed)
EPIC Encounter for ICM Monitoring  Patient Name: Grace Robinson is a 86 y.o. female Date: 07/25/2020 Primary Care Physican: Shon Baton, MD Primary Cardiologist: Truitt Merle, NP Electrophysiologist: Vergie Living Pacing: 98.2% LastWeight:133lbs  VT-NS (>4 beats, >130 bpm) 4 Time in AT/AF <0.1 hr/day (<0.1%)    Spoke with patient and reports swelling of both feet that does slightly improve overnight  OptivolThoracic impedancenormal.  Prescribed:  Furosemide20 mg take1 tablet (20 mg total) by mouth daily alternating (40 mg) every other day.  Potassium 10 mEq1tablet twice a day.  Labs: 07/04/2019 Creatinine 1.0, BUN 14, Potassium 4.3, Sodium 139, GFR 52.1-63.0  Recommendations:Advised to take Furosemide 40 mg daily x 3 consecutive days and then return to alternating schedule of 20 mg every other day.  Encouraged to call if symptoms worsen or do not improve.  Follow-up plan: ICM clinic phone appointment on10/29/2021. 91 day device clinic remote transmission10/28/2021.   EP/Cardiology Office Visits:10/15/2020 withLori Gerhardt,NP.  Recall 07/03/2021 with Dr Caryl Comes  Copy of ICM check sent to [place] and Truitt Merle, NP.    3 month ICM trend: 07/21/2020    1 Year ICM trend:       [name], RN 07/25/2020 8:55 AM

## 2020-08-01 DIAGNOSIS — R627 Adult failure to thrive: Secondary | ICD-10-CM | POA: Diagnosis not present

## 2020-08-01 DIAGNOSIS — I2510 Atherosclerotic heart disease of native coronary artery without angina pectoris: Secondary | ICD-10-CM | POA: Diagnosis not present

## 2020-08-01 DIAGNOSIS — Z23 Encounter for immunization: Secondary | ICD-10-CM | POA: Diagnosis not present

## 2020-08-01 DIAGNOSIS — E1122 Type 2 diabetes mellitus with diabetic chronic kidney disease: Secondary | ICD-10-CM | POA: Diagnosis not present

## 2020-08-01 DIAGNOSIS — Z7189 Other specified counseling: Secondary | ICD-10-CM | POA: Diagnosis not present

## 2020-08-01 DIAGNOSIS — E1140 Type 2 diabetes mellitus with diabetic neuropathy, unspecified: Secondary | ICD-10-CM | POA: Diagnosis not present

## 2020-08-04 NOTE — Progress Notes (Signed)
Assessment/Plan:    1.  Essential Tremor  -Continue primidone, 50 mg, half tablet at bed and can take extra 1/2 tablet prn  -discussed booster shots but she had moderna.  Awaiting for further FDA recommendations in few weeks  2.  I did send in a prescription today for her refills of primidone.  However, following those, I did ask her to follow-up with her primary care physician since she is doing so well on just low-dose primidone and has not needed any changes.  Happy to see her back if new neurologic issues arise in the future.  Pt stated that easier to have her PCP refill as hard to get here.    Subjective:   Grace Robinson was seen today in follow up for essential tremor.  My previous records were reviewed prior to todays visit. She is on 1/2 tablet of primidone, which generally works well.  If she is stressed, she will have more tremor.  She will rarely take a full tablet.  States that she moved to friends home everyone has tremor so she doesn't feel so stressed!  Pt denies falls.  Pt denies lightheadedness, near syncope.  No hallucinations.  Mood has been good.  Current prescribed movement disorder medications: primidone, 50 mg, 1/2 tablet daily    ALLERGIES:   Allergies  Allergen Reactions  . Carvedilol Other (See Comments)  . Vancomycin Other (See Comments)  . Sudafed [Pseudoephedrine] Other (See Comments)    Makes skin feel like its crawling    CURRENT MEDICATIONS:  Outpatient Encounter Medications as of 08/05/2020  Medication Sig  . acetaminophen (TYLENOL) 500 MG tablet Take 500 mg by mouth as needed for mild pain.  Marland Kitchen aspirin (ASPIRIN ADULT) 325 MG tablet Take 1 tablet (325 mg total) by mouth daily.  Marland Kitchen atorvastatin (LIPITOR) 40 MG tablet Take 40 mg by mouth daily.  . Biotin 5000 MCG CAPS Take 1 capsule by mouth daily.  . Cholecalciferol (VITAMIN D-3) 25 MCG (1000 UT) CAPS Take 2 capsules by mouth daily.   . cholestyramine (QUESTRAN) 4 g packet Take 1 packet (4 g  total) by mouth daily.  Marland Kitchen dicyclomine (BENTYL) 10 MG capsule Take 1 capsule (10 mg total) by mouth 3 (three) times daily before meals. Take 1 tab 30 min before meals  . furosemide (LASIX) 20 MG tablet Take 1 tablet (20 mg total) by mouth daily. alternating (40 mg) every other day.  Marland Kitchen HUMALOG MIX 75/25 (75-25) 100 UNIT/ML SUSP injection Inject 32 Units into the skin 2 (two) times daily with a meal.   . metoprolol succinate (TOPROL-XL) 100 MG 24 hr tablet Take 1 tablet by mouth 2 (two) times daily.  Marland Kitchen olmesartan (BENICAR) 40 MG tablet Take 40 mg by mouth daily.  . potassium chloride (K-DUR) 10 MEQ tablet Take 1 tablet by mouth 2 (two) times daily.  . primidone (MYSOLINE) 50 MG tablet TAKE 1 TABLET(50 MG) BY MOUTH AT BEDTIME (Patient taking differently: Take 25 mg by mouth at bedtime. )  . vitamin B-12 (CYANOCOBALAMIN) 1000 MCG tablet Take 1,000 mcg by mouth daily.  . vitamin E 400 UNIT capsule Take 400 Units by mouth daily.  . Insulin Aspart Prot & Aspart (INSULIN ASP PROT & ASP FLEXPEN) (70-30) 100 UNIT/ML SUPN Inject 32 Units into the skin in the morning and at bedtime.  . Lancets (ONETOUCH DELICA PLUS [id number]) MISC CHECK BLOOD SUGAR TWICE DAILY  . ONETOUCH VERIO test strip 2 (two) times daily.   No facility-administered  encounter medications on file as of 08/05/2020.     Objective:    PHYSICAL EXAMINATION:    VITALS:   Vitals:   08/05/20 1247  BP: (!) 187/84  Pulse: 93  SpO2: 97%  Weight: 142 lb (64.4 kg)  Height: 5' 6.5" (1.689 m)    GEN:  The patient appears stated age and is in NAD. HEENT:  Normocephalic, atraumatic.  The mucous membranes are moist. The superficial temporal arteries are without ropiness or tenderness. CV:  RRR Lungs:  CTAB Neck/HEME:  There are no carotid bruits bilaterally.  Neurological examination:  Orientation: The patient is alert and oriented x3. Cranial nerves: There is good facial symmetry. The speech is fluent and clear. Soft palate rises  symmetrically and there is no tongue deviation. Hearing is intact to conversational tone. Sensation: Sensation is intact to light touch throughout Motor: Strength is at least antigravity x4.  Movement examination: Tone: There is normal tone in the UE/LE Abnormal movements: there is no  Coordination:  There is no decremation with RAM's, with any form of RAMS, including alternating supination and pronation of the forearm, hand opening and closing, finger taps, heel taps and toe taps. Gait and Station: The patient ambulates well with her walker I have reviewed and interpreted the following labs independently   Chemistry      Component Value Date/Time   NA 146 (H) 08/08/2018 1435   K 3.8 08/08/2018 1435   CL 106 08/08/2018 1435   CO2 22 08/08/2018 1435   BUN 13 08/08/2018 1435   CREATININE 0.83 08/08/2018 1435   CREATININE 0.81 07/30/2016 0841      Component Value Date/Time   CALCIUM 9.4 08/08/2018 1435   ALKPHOS 73 07/30/2016 0841   AST 22 07/30/2016 0841   ALT 19 07/30/2016 0841   BILITOT 0.8 07/30/2016 0841      Lab Results  Component Value Date   WBC 6.3 08/08/2018   HGB 13.2 08/08/2018   HCT 39.0 08/08/2018   MCV 92 08/08/2018   PLT 152 08/08/2018   Lab Results  Component Value Date   TSH 1.23 07/20/2011     Chemistry      Component Value Date/Time   NA 146 (H) 08/08/2018 1435   K 3.8 08/08/2018 1435   CL 106 08/08/2018 1435   CO2 22 08/08/2018 1435   BUN 13 08/08/2018 1435   CREATININE 0.83 08/08/2018 1435   CREATININE 0.81 07/30/2016 0841      Component Value Date/Time   CALCIUM 9.4 08/08/2018 1435   ALKPHOS 73 07/30/2016 0841   AST 22 07/30/2016 0841   ALT 19 07/30/2016 0841   BILITOT 0.8 07/30/2016 0841         Total time spent on today's visit was 20 minutes, including both face-to-face time and nonface-to-face time.  Time included that spent on review of records (prior notes available to me/labs/imaging if pertinent), discussing treatment and  goals, answering patient's questions and coordinating care.  Cc:  Shon Baton, MD

## 2020-08-05 ENCOUNTER — Ambulatory Visit: Payer: Medicare Other | Admitting: Neurology

## 2020-08-05 ENCOUNTER — Encounter: Payer: Self-pay | Admitting: Neurology

## 2020-08-05 ENCOUNTER — Other Ambulatory Visit: Payer: Self-pay

## 2020-08-05 VITALS — BP 187/84 | HR 93 | Ht 66.5 in | Wt 142.0 lb

## 2020-08-05 DIAGNOSIS — G250 Essential tremor: Secondary | ICD-10-CM | POA: Diagnosis not present

## 2020-08-05 NOTE — Patient Instructions (Signed)
Its always a pleasure to see you!  Let me know if you need me!  The physicians and staff at [facility] are committed to providing excellent care. You may receive a survey requesting feedback about your experience at our office. We strive to receive "very good" responses to the survey questions. If you feel that your experience would prevent you from giving the office a "very good " response, please contact our office to try to remedy the situation. We may be reached at [phone]. Thank you for taking the time out of your busy day to complete the survey.

## 2020-08-13 DIAGNOSIS — Z85828 Personal history of other malignant neoplasm of skin: Secondary | ICD-10-CM | POA: Diagnosis not present

## 2020-08-13 DIAGNOSIS — D225 Melanocytic nevi of trunk: Secondary | ICD-10-CM | POA: Diagnosis not present

## 2020-08-13 DIAGNOSIS — L821 Other seborrheic keratosis: Secondary | ICD-10-CM | POA: Diagnosis not present

## 2020-08-20 ENCOUNTER — Other Ambulatory Visit: Payer: Self-pay | Admitting: Neurology

## 2020-08-28 ENCOUNTER — Ambulatory Visit (INDEPENDENT_AMBULATORY_CARE_PROVIDER_SITE_OTHER): Payer: Medicare Other

## 2020-08-28 ENCOUNTER — Telehealth: Payer: Self-pay

## 2020-08-28 DIAGNOSIS — I428 Other cardiomyopathies: Secondary | ICD-10-CM

## 2020-08-28 LAB — CUP PACEART REMOTE DEVICE CHECK
Battery Remaining Longevity: 19 mo
Battery Voltage: 2.91 V
Brady Statistic AP VP Percent: 42.4 %
Brady Statistic AP VS Percent: 0.65 %
Brady Statistic AS VP Percent: 56.06 %
Brady Statistic AS VS Percent: 0.89 %
Brady Statistic RA Percent Paced: 42.85 %
Brady Statistic RV Percent Paced: 0.17 %
Date Time Interrogation Session: 20211028073725
HighPow Impedance: 50 Ohm
HighPow Impedance: 60 Ohm
Implantable Lead Implant Date: 20031007
Implantable Lead Implant Date: 20031007
Implantable Lead Implant Date: 20031007
Implantable Lead Implant Date: 20060227
Implantable Lead Location: 753858
Implantable Lead Location: 753859
Implantable Lead Location: 753860
Implantable Lead Location: 753860
Implantable Lead Model: 4193
Implantable Lead Model: 4469
Implantable Lead Model: 4470
Implantable Lead Model: 6949
Implantable Lead Serial Number: 337988
Implantable Lead Serial Number: 361236
Implantable Pulse Generator Implant Date: 20141022
Lead Channel Impedance Value: 342 Ohm
Lead Channel Impedance Value: 4047 Ohm
Lead Channel Impedance Value: 4047 Ohm
Lead Channel Impedance Value: 418 Ohm
Lead Channel Impedance Value: 456 Ohm
Lead Channel Impedance Value: 456 Ohm
Lead Channel Pacing Threshold Amplitude: 0.75 V
Lead Channel Pacing Threshold Amplitude: 0.875 V
Lead Channel Pacing Threshold Amplitude: 1.125 V
Lead Channel Pacing Threshold Pulse Width: 0.4 ms
Lead Channel Pacing Threshold Pulse Width: 0.4 ms
Lead Channel Pacing Threshold Pulse Width: 0.4 ms
Lead Channel Sensing Intrinsic Amplitude: 1.125 mV
Lead Channel Sensing Intrinsic Amplitude: 1.125 mV
Lead Channel Sensing Intrinsic Amplitude: 31.625 mV
Lead Channel Sensing Intrinsic Amplitude: 31.625 mV
Lead Channel Setting Pacing Amplitude: 2 V
Lead Channel Setting Pacing Amplitude: 2.25 V
Lead Channel Setting Pacing Amplitude: 2.5 V
Lead Channel Setting Pacing Pulse Width: 0.4 ms
Lead Channel Setting Pacing Pulse Width: 0.4 ms
Lead Channel Setting Sensing Sensitivity: 0.6 mV

## 2020-08-28 NOTE — Telephone Encounter (Signed)
[facility] scheduled remote received 08/28/20.  08/12/20 - 1 VT episode (11 seconds) converted with one burst of ATP.   Med List: Lasix 20 mg daily/40 mg every other day, Toprol- XL 100 mg BID.  Called patient to assess. No answer, LMOVM.

## 2020-08-29 ENCOUNTER — Ambulatory Visit (INDEPENDENT_AMBULATORY_CARE_PROVIDER_SITE_OTHER): Payer: Medicare Other

## 2020-08-29 DIAGNOSIS — Z95810 Presence of automatic (implantable) cardiac defibrillator: Secondary | ICD-10-CM | POA: Diagnosis not present

## 2020-08-29 DIAGNOSIS — I5022 Chronic systolic (congestive) heart failure: Secondary | ICD-10-CM

## 2020-08-29 NOTE — Telephone Encounter (Signed)
Patient returning phone call.  Patient states she has felt fine and was not aware of any issues. Patient states she has experienced swelling in her feet during the day but improves over night. Patient is compliant with medications.  Shock plan/ driving restrictions reviewed.   Routing to Dr. Caryl Comes for review or recommendations.

## 2020-09-01 NOTE — Progress Notes (Signed)
EPIC Encounter for ICM Monitoring  Patient Name: Grace Robinson is a 86 y.o. female Date: 09/01/2020 Primary Care Physican: Shon Baton, MD Primary Cardiologist: Truitt Merle, NP Electrophysiologist: Vergie Living Pacing: 98.0% LastWeight:133lbs  Time in AT/AF  (0.0%)    Spoke with patient and reports feeling well at this time.  Denies fluid symptoms.    OptivolThoracic impedancenormal.  Prescribed:  Furosemide20 mg take1 tablet (20 mg total) by mouth daily alternating (40 mg) every other day.  Potassium 10 mEq1tablet twice a day.  Labs: 07/04/2019 Creatinine 1.0, BUN 14, Potassium 4.3, Sodium 139, GFR 52.1-63.0  Recommendations:No changes and encouraged to call if experiencing any fluid symptoms.  Follow-up plan: ICM clinic phone appointment on11/30/2021. 91 day device clinic remote transmission1/27/2022.   EP/Cardiology Office Visits:11/10/2020 withLori Gerhardt,NP.Recall 07/03/2021 with Dr Caryl Comes  Copy of ICM check sent to [place].   3 month ICM trend: 08/28/2020    1 Year ICM trend:       [name], RN 09/01/2020 2:20 PM

## 2020-09-02 NOTE — Progress Notes (Signed)
Remote ICD transmission.   

## 2020-09-30 ENCOUNTER — Ambulatory Visit (INDEPENDENT_AMBULATORY_CARE_PROVIDER_SITE_OTHER): Payer: Medicare Other

## 2020-09-30 DIAGNOSIS — Z95810 Presence of automatic (implantable) cardiac defibrillator: Secondary | ICD-10-CM | POA: Diagnosis not present

## 2020-09-30 DIAGNOSIS — I5022 Chronic systolic (congestive) heart failure: Secondary | ICD-10-CM | POA: Diagnosis not present

## 2020-10-03 NOTE — Progress Notes (Signed)
EPIC Encounter for ICM Monitoring  Patient Name: Grace Robinson is a 86 y.o. female Date: 10/03/2020 Primary Care Physican: Shon Baton, MD Primary Cardiologist: Truitt Merle, NP Electrophysiologist: Vergie Living Pacing: 97.8% LastWeight:133lbs  Time in AT/AF(0.0%)    Transmission reviewed.  OptivolThoracic impedancenormal.  Prescribed:  Furosemide20 mg take1 tablet (20 mg total) by mouth daily alternating (40 mg) every other day.  Potassium 10 mEq1tablet twice a day.  Labs: 07/04/2019 Creatinine 1.0, BUN 14, Potassium 4.3, Sodium 139, GFR 52.1-63.0  Recommendations:No changes.  Follow-up plan: ICM clinic phone appointment on1/12/2020. 91 day device clinic remote transmission1/27/2022.   EP/Cardiology Office Visits:11/10/2020 withLori Gerhardt,NP.Recall 07/03/2021 with Dr Caryl Comes  Copy of ICM check sent to [place]  3 month ICM trend: 09/30/2020    1 Year ICM trend:       [name], RN 10/03/2020 4:14 PM

## 2020-10-15 ENCOUNTER — Ambulatory Visit: Payer: Medicare Other | Admitting: Nurse Practitioner

## 2020-11-03 ENCOUNTER — Ambulatory Visit (INDEPENDENT_AMBULATORY_CARE_PROVIDER_SITE_OTHER): Payer: Medicare Other

## 2020-11-03 DIAGNOSIS — Z95810 Presence of automatic (implantable) cardiac defibrillator: Secondary | ICD-10-CM

## 2020-11-03 DIAGNOSIS — I5022 Chronic systolic (congestive) heart failure: Secondary | ICD-10-CM

## 2020-11-05 NOTE — Progress Notes (Signed)
CARDIOLOGY OFFICE NOTE  Date:  11/10/2020    Grace Robinson Date of Birth: 03-15-1926 Medical Record [id number]  PCP:  Shon Baton, MD  Cardiologist:  Ree Shay  Chief Complaint  Patient presents with  . Follow-up    Seen for Dr. Caryl Comes    History of Present Illness: Grace Robinson is a 87 y.o. female who presents today for a follow up visit. Seen for Dr. Percival Spanish and Dr. Caryl Comes. She is a former patient of Dr. Susa Simmonds. Primarily follows with me.    She has an nonischemic CM with remote EF down to 25% that has improved with aggressive medical therapy and CRT-D. Last generator change in 2014. Other issues include HLD, IDDM, ulcerative colitis, LBBB, Meniere's disease and advanced age. Last echo back in 07/2014 showed her EF had normalized at 50 to 55%.    I have seen her over the past several years. She has basically done pretty well from our standpoint. Some occasional issues with swelling/elevated BP but for the most part has done ok. I sent her to see [name] with GI who helped her tremendously with her diarrhea.    When seen in the office back in October of 2019 - she was making plans to move to [facility]. She had had a spell of VT that was treated with ATP in August of 2019.  She was not interested in having any testing to see if EF had changed. She was to not drive for 6 months.  She did end up moving to [facility] - got shutdown that same day due to the pandemic and it was quite an adjustment for her. She has had prior fall - unclear as to how/why. She did not wish to go to the ER.     I was able see her back in April here in the office - restrictions were being lifted. She has been vaccinated. She was starting to meet the other residents. BP was up but she felt like it was due to rushing to get here. At the end of the visit she told me of a spell on March 24 - she had a spell of where her right hand was "lifeless" and "numb" - she dropped everything she tried to pick up -  a cup, a pen, etc. No other symptoms. Her legs were fine. It lasted 10 minutes - has not recurred - she thought she was having a stroke. Since it had not recurred - she was thinking of not telling me. She is only on baby aspirin. We ended up getting carotids updated and a CT - no acute findings - I increased her to a full dose of aspirin. She did not wish to see neurology.   We did a virtual visit back in August - very bored - had gained weight. No falls. Balance getting worse. Cardiac status seemed ok. Did a virtual visit with Dr. Caryl Comes as well in September.    Comes in today. Here with [name] - her son. She is doing well. No chest pain. Breathing is good and her breathing is stable. Has noted more swelling in her legs - goes down overnight. Weight unchanged.  She has lots of varicosities and has had prior vein procedures done in the past. She did try her support stockings one night and this helped as well. No falls. The pandemic remains hard - [facility] is almost shut back down. No more TIA like symptoms. She remains on  full dose aspirin. Her physical is coming up with Dr. Timothy Lasso - she has limited her driving - the week before lab with him is challenging for her. BP better at home.   Past Medical History:  Diagnosis Date  . Biventricular ICD (implantable cardiac defibrillator) in place   . Cellulitis   . Change in bowel habits   . CHF (congestive heart failure) (HCC)   . Colon polyps   . Diabetes mellitus    Type II  . Diarrhea   . Dyslipidemia   . HOH (hard of hearing)    left  . HTN (hypertension)   . Ischemic cardiomyopathy   . LBBB (left bundle branch block)    chronic  . Meniere's disease   . T wave oversensing 01/17/2014  . Ulcerative colitis   . Varicose veins    4 lazer  treatment each leg    Past Surgical History:  Procedure Laterality Date  . ABDOMINAL HYSTERECTOMY    . CARDIAC CATHETERIZATION  08/2011  . CHOLECYSTECTOMY    . COLONOSCOPY    . EP Study  08/22/08  .  IMPLANTABLE CARDIOVERTER DEFIBRILLATOR GENERATOR CHANGE N/A 08/22/2013   Procedure: IMPLANTABLE CARDIOVERTER DEFIBRILLATOR GENERATOR CHANGE;  Surgeon: Duke Salvia, MD;  Location[facility] CATH LAB;  Service: Cardiovascular;  Laterality: N/A;  . KNEE ARTHROSCOPY Left 04/14/2016   Procedure: LEFT KNEE ARTHROSCOPY, PARTIAL MEDIAL AND PARTIAL LATERAL MENISCECTOMY, MEDIAL PLICA;  Surgeon: Jodi Geralds, MD;  Location: [facility] OR;  Service: Orthopedics;  Laterality: Left;  . mastoid sugery     in the setting of Meniere's disease  . TONSILLECTOMY AND ADENOIDECTOMY       Medications: Current Meds  Medication Sig  . acetaminophen (TYLENOL) 500 MG tablet Take 500 mg by mouth as needed for mild pain.  Marland Kitchen aspirin (ASPIRIN ADULT) 325 MG tablet Take 1 tablet (325 mg total) by mouth daily.  Marland Kitchen atorvastatin (LIPITOR) 40 MG tablet Take 40 mg by mouth daily.  . Biotin 5000 MCG CAPS Take 1 capsule by mouth daily.  . Cholecalciferol (VITAMIN D-3) 25 MCG (1000 UT) CAPS Take 2 capsules by mouth daily.   . cholestyramine (QUESTRAN) 4 g packet Take 1 packet (4 g total) by mouth daily.  Marland Kitchen dicyclomine (BENTYL) 10 MG capsule Take 1 capsule (10 mg total) by mouth 3 (three) times daily before meals. Take 1 tab 30 min before meals  . furosemide (LASIX) 20 MG tablet Take 1 tablet (20 mg total) by mouth daily. alternating (40 mg) every other day.  Marland Kitchen HUMALOG MIX 75/25 (75-25) 100 UNIT/ML SUSP injection Inject 32 Units into the skin 2 (two) times daily with a meal.   . Insulin Aspart Prot & Aspart (INSULIN ASP PROT & ASP FLEXPEN) (70-30) 100 UNIT/ML SUPN Inject 32 Units into the skin in the morning and at bedtime.  . Lancets (ONETOUCH DELICA PLUS LANCET30G) MISC CHECK BLOOD SUGAR TWICE DAILY  . metoprolol succinate (TOPROL-XL) 100 MG 24 hr tablet Take 1 tablet by mouth 2 (two) times daily.  Marland Kitchen olmesartan (BENICAR) 40 MG tablet Take 40 mg by mouth daily.  Letta Pate VERIO test strip 2 (two) times daily.  . potassium chloride (K-DUR) 10  MEQ tablet Take 1 tablet by mouth 2 (two) times daily.  . primidone (MYSOLINE) 50 MG tablet TAKE 1 TABLET(50 MG) BY MOUTH AT BEDTIME (Patient taking differently: Take 50 mg by mouth as needed (shaking of hands).)  . vitamin B-12 (CYANOCOBALAMIN) 1000 MCG tablet Take 1,000 mcg by mouth daily.  Marland Kitchen  vitamin E 400 UNIT capsule Take 400 Units by mouth daily.     Allergies: Allergies  Allergen Reactions  . Carvedilol Other (See Comments)  . Vancomycin Other (See Comments)  . Sudafed [Pseudoephedrine] Other (See Comments)    Makes skin feel like its crawling    Social History: The patient  reports that she has quit smoking. She quit after 1.00 year of use. She has never used smokeless tobacco. She reports that she does not drink alcohol and does not use drugs.   Family History: The patient's family history includes Breast cancer in her mother; Emphysema (age of onset: 11) in her father; Healthy in her child; Heart disease in her father; Lung cancer in her mother.   Review of Systems: Please see the history of present illness.   All other systems are reviewed and negative.   Physical Exam: VS:  BP (!) 150/86   Pulse 74   Ht 5' 6.5" (1.689 m)   Wt 142 lb 9.6 oz (64.7 kg)   SpO2 98%   BMI 22.67 kg/m  .  BMI Body mass index is 22.67 kg/m.  Wt Readings from Last 3 Encounters:  11/10/20 142 lb 9.6 oz (64.7 kg)  08/05/20 142 lb (64.4 kg)  07/17/20 142 lb (64.4 kg)    General: Pleasant. Alert and in no acute distress.   Neck: Supple, no JVD, carotid bruits, or masses noted.  Cardiac: Regular rate and rhythm. No murmurs, rubs, or gallops. Trace edema. She has lots of varicosities.  Respiratory:  Lungs are clear to auscultation bilaterally with normal work of breathing.  GI: Soft and nontender.  MS: No deformity or atrophy. Gait and ROM intact. Using a cane.  Skin: Warm and dry. Color is normal.  Neuro:  Strength and sensation are intact and no gross focal deficits noted.  Psych: Alert,  appropriate and with normal affect.   LABORATORY DATA:  EKG:  EKG is not ordered today.    Lab Results  Component Value Date   WBC 6.3 08/08/2018   HGB 13.2 08/08/2018   HCT 39.0 08/08/2018   PLT 152 08/08/2018   GLUCOSE 112 (H) 08/08/2018   ALT 19 07/30/2016   AST 22 07/30/2016   NA 146 (H) 08/08/2018   K 3.8 08/08/2018   CL 106 08/08/2018   CREATININE 0.83 08/08/2018   BUN 13 08/08/2018   CO2 22 08/08/2018   TSH 1.23 07/20/2011   INR 1.1 (H) 08/13/2013       BNP (last 3 results) No results for input(s): BNP in the last 8760 hours.  ProBNP (last 3 results) No results for input(s): PROBNP in the last 8760 hours.   Other Studies Reviewed Today:  Carotid Summary April 2021:  Right Carotid: Velocities in the right ICA are consistent with a 1-39%  stenosis.   Left Carotid: Velocities in the left ICA are consistent with a 1-39%  stenosis.   Vertebrals:  Bilateral vertebral arteries demonstrate antegrade flow.  Subclavians: Normal flow hemodynamics were seen in bilateral subclavian arteries.   *See table(s) above for measurements and observations.       CT HEAD IMPRESSION 01/2020: 1. No acute intracranial abnormality. If there is persisting clinical concern for acute/subacute ischemia, MRI could be obtained. 2. Moderate parenchymal volume loss and chronic microvascular angiopathy. 3. Prior left mastoidectomy.   Electronically Signed   By: Kreg Shropshire M.D.   On: 02/14/2020 23:51     Echo Study Conclusions from 07/2014  - Left ventricle: The  cavity size was normal. Systolic function was   normal. The estimated ejection fraction was in the range of 50%   to 55%. There was an increased relative contribution of atrial   contraction to ventricular filling. Doppler parameters are   consistent with abnormal left ventricular relaxation (grade 1   diastolic dysfunction). - Ventricular septum: Septal motion showed paradox. These changes   are consistent with  right ventricular pacing. - Aortic valve: There was mild regurgitation. - Mitral valve: There is a calcified mobile density in the LV   cavity most consistent with calcified redundant chordae tendinae.   This was present on echo in 2014. There was mild regurgitation. - Pulmonic valve: There was trivial regurgitation.       ASSESSMENT & PLAN:     1. NICM - she had recovery of her EF by her last echo back in 2015 - she looks good. Breathing is stable. Would advise lower extremity support stockings - I think this will help.   2. Prior TIA - did not wish to see neurology - remains on full dose aspirin. This has not recurred.   3. HTN - BP better as outpatient - no changes made today.   4. Underlying CRT-D - followed by EP.   5. Varicose veins - suggested going back to support stockings.   53. Advanced age - she is holding her own - the pandemic has been hard for her since she moved to [facility].   Current medicines are reviewed with the patient today.  The patient does not have concerns regarding medicines other than what has been noted above.  The following changes have been made:  See above.  Labs/ tests ordered today include:    Orders Placed This Encounter  Procedures  . Basic metabolic panel  . CBC  . Hepatic function panel  . Lipid panel  . Hemoglobin A1c     Disposition:   FU with Tommye Standard, PA in about 3 to 4 months. Labs today that we will forward to Dr. Virgina Jock. She is aware that I am leaving next month.    Patient is agreeable to this plan and will call if any problems develop in the interim.   SignedTruitt Merle, NP  11/10/2020 3:05 PM  [facility] [address] [place], [place]  [address] Phone: [phone] Fax: [phone]

## 2020-11-05 NOTE — Progress Notes (Signed)
EPIC Encounter for ICM Monitoring  Patient Name: Grace Robinson is a 87 y.o. female Date: 11/05/2020 Primary Care Physican: Creola Corn, MD Primary Cardiologist: Norma Fredrickson, NP Electrophysiologist: Joycelyn Schmid Pacing: 97.8% LastWeight:133lbs  Time in AT/AF(0.0%)    Spoke with patient and reports she has a mild head cold.  She does have swelling in feet but resolves while she is sleeping.   OptivolThoracic impedancenormal.  Prescribed:  Furosemide20 mg take1 tablet (20 mg total) by mouth daily alternating (40 mg) every other day.  Potassium 10 mEq1tablet twice a day.  Labs: 07/04/2019 Creatinine 1.0, BUN 14, Potassium 4.3, Sodium 139, GFR 52.1-63.0  Recommendations:No changes and encouraged to call if experiencing any fluid symptoms.  Follow-up plan: ICM clinic phone appointment on2/06/2021. 91 day device clinic remote transmission1/27/2022.   EP/Cardiology Office Visits:1/10/2022withLori Gerhardt,NP.Recall 07/03/2021 with Dr Graciela Husbands  Copy of ICM check sent to Dr.Klein   3 month ICM trend: 11/03/2020.    1 Year ICM trend:       Karie Soda, RN 11/05/2020 9:53 AM

## 2020-11-10 ENCOUNTER — Encounter: Payer: Self-pay | Admitting: Nurse Practitioner

## 2020-11-10 ENCOUNTER — Ambulatory Visit: Payer: Medicare Other | Admitting: Nurse Practitioner

## 2020-11-10 ENCOUNTER — Other Ambulatory Visit: Payer: Self-pay

## 2020-11-10 VITALS — BP 150/86 | HR 74 | Ht 66.5 in | Wt 142.6 lb

## 2020-11-10 DIAGNOSIS — I5022 Chronic systolic (congestive) heart failure: Secondary | ICD-10-CM

## 2020-11-10 DIAGNOSIS — I472 Ventricular tachycardia, unspecified: Secondary | ICD-10-CM

## 2020-11-10 DIAGNOSIS — I428 Other cardiomyopathies: Secondary | ICD-10-CM | POA: Diagnosis not present

## 2020-11-10 DIAGNOSIS — Z95810 Presence of automatic (implantable) cardiac defibrillator: Secondary | ICD-10-CM | POA: Diagnosis not present

## 2020-11-10 NOTE — Patient Instructions (Addendum)
After Visit Summary:  We will be checking the following labs today - BMET, CBC, HPF, Lipids, A1C   Medication Instructions:    Continue with your current medicines.    If you need a refill on your cardiac medications before your next appointment, please call your pharmacy.     Testing/Procedures To Be Arranged:  N/A  Follow-Up:   See Joseph Art' Ursuy, PA in about 3 to 4 months    At [facility], you and your health needs are our priority.  As part of our continuing mission to provide you with exceptional heart care, we have created designated Provider Care Teams.  These Care Teams include your primary Cardiologist (physician) and Advanced Practice Providers (APPs -  Physician Assistants and Nurse Practitioners) who all work together to provide you with the care you need, when you need it.  Special Instructions:  . Stay safe, wash your hands for at least 20 seconds and wear a mask when needed.  . It was good to talk with you today. . Go back to wearing your compression stockings during the day  . Thank you!   Call the [place] office at [phone] if you have any questions, problems or concerns.

## 2020-11-11 LAB — LIPID PANEL
Chol/HDL Ratio: 2.7 ratio (ref 0.0–4.4)
Cholesterol, Total: 152 mg/dL (ref 100–199)
HDL: 57 mg/dL (ref >39)
LDL Chol Calc (NIH): 68 mg/dL (ref 0–99)
Triglycerides: 159 mg/dL — ABNORMAL HIGH (ref 0–149)
VLDL Cholesterol Cal: 27 mg/dL (ref 5–40)

## 2020-11-11 LAB — BASIC METABOLIC PANEL
BUN/Creatinine Ratio: 13 (ref 12–28)
BUN: 14 mg/dL (ref 10–36)
CO2: 26 mmol/L (ref 20–29)
Calcium: 9.7 mg/dL (ref 8.7–10.3)
Chloride: 102 mmol/L (ref 96–106)
Creatinine, Ser: 1.08 mg/dL — ABNORMAL HIGH (ref 0.57–1.00)
GFR calc Af Amer: 51 mL/min/{1.73_m2} — ABNORMAL LOW (ref >59)
GFR calc non Af Amer: 45 mL/min/{1.73_m2} — ABNORMAL LOW (ref >59)
Glucose: 92 mg/dL (ref 65–99)
Potassium: 4 mmol/L (ref 3.5–5.2)
Sodium: 143 mmol/L (ref 134–144)

## 2020-11-11 LAB — CBC
Hematocrit: 43.7 % (ref 34.0–46.6)
Hemoglobin: 14.6 g/dL (ref 11.1–15.9)
MCH: 31.6 pg (ref 26.6–33.0)
MCHC: 33.4 g/dL (ref 31.5–35.7)
MCV: 95 fL (ref 79–97)
Platelets: 177 10*3/uL (ref 150–450)
RBC: 4.62 x10E6/uL (ref 3.77–5.28)
RDW: 13 % (ref 11.7–15.4)
WBC: 7.5 10*3/uL (ref 3.4–10.8)

## 2020-11-11 LAB — HEPATIC FUNCTION PANEL
ALT: 18 IU/L (ref 0–32)
AST: 21 IU/L (ref 0–40)
Albumin: 4.7 g/dL — ABNORMAL HIGH (ref 3.5–4.6)
Alkaline Phosphatase: 79 IU/L (ref 44–121)
Bilirubin Total: 0.6 mg/dL (ref 0.0–1.2)
Bilirubin, Direct: 0.18 mg/dL (ref 0.00–0.40)
Total Protein: 7.3 g/dL (ref 6.0–8.5)

## 2020-11-11 LAB — HEMOGLOBIN A1C
Est. average glucose Bld gHb Est-mCnc: 166 mg/dL
Hgb A1c MFr Bld: 7.4 % — ABNORMAL HIGH (ref 4.8–5.6)

## 2020-11-19 ENCOUNTER — Other Ambulatory Visit: Payer: Self-pay | Admitting: Neurology

## 2020-11-27 ENCOUNTER — Ambulatory Visit (INDEPENDENT_AMBULATORY_CARE_PROVIDER_SITE_OTHER): Payer: Medicare Other

## 2020-11-27 DIAGNOSIS — I4729 Other ventricular tachycardia: Secondary | ICD-10-CM

## 2020-11-27 DIAGNOSIS — I472 Ventricular tachycardia: Secondary | ICD-10-CM

## 2020-11-27 LAB — CUP PACEART REMOTE DEVICE CHECK
Battery Remaining Longevity: 19 mo
Battery Voltage: 2.9 V
Brady Statistic AP VP Percent: 42.34 %
Brady Statistic AP VS Percent: 0.67 %
Brady Statistic AS VP Percent: 56.1 %
Brady Statistic AS VS Percent: 0.89 %
Brady Statistic RA Percent Paced: 42.73 %
Brady Statistic RV Percent Paced: 0.35 %
Date Time Interrogation Session: 20220127022603
HighPow Impedance: 53 Ohm
HighPow Impedance: 74 Ohm
Implantable Lead Implant Date: 20031007
Implantable Lead Implant Date: 20031007
Implantable Lead Implant Date: 20031007
Implantable Lead Implant Date: 20060227
Implantable Lead Location: 753858
Implantable Lead Location: 753859
Implantable Lead Location: 753860
Implantable Lead Location: 753860
Implantable Lead Model: 4193
Implantable Lead Model: 4469
Implantable Lead Model: 4470
Implantable Lead Model: 6949
Implantable Lead Serial Number: 337988
Implantable Lead Serial Number: 361236
Implantable Pulse Generator Implant Date: 20141022
Lead Channel Impedance Value: 361 Ohm
Lead Channel Impedance Value: 4047 Ohm
Lead Channel Impedance Value: 4047 Ohm
Lead Channel Impedance Value: 456 Ohm
Lead Channel Impedance Value: 475 Ohm
Lead Channel Impedance Value: 475 Ohm
Lead Channel Pacing Threshold Amplitude: 0.875 V
Lead Channel Pacing Threshold Amplitude: 1 V
Lead Channel Pacing Threshold Amplitude: 1.25 V
Lead Channel Pacing Threshold Pulse Width: 0.4 ms
Lead Channel Pacing Threshold Pulse Width: 0.4 ms
Lead Channel Pacing Threshold Pulse Width: 0.4 ms
Lead Channel Sensing Intrinsic Amplitude: 1.25 mV
Lead Channel Sensing Intrinsic Amplitude: 1.25 mV
Lead Channel Sensing Intrinsic Amplitude: 31.625 mV
Lead Channel Sensing Intrinsic Amplitude: 31.625 mV
Lead Channel Setting Pacing Amplitude: 2 V
Lead Channel Setting Pacing Amplitude: 2 V
Lead Channel Setting Pacing Amplitude: 2.5 V
Lead Channel Setting Pacing Pulse Width: 0.4 ms
Lead Channel Setting Pacing Pulse Width: 0.4 ms
Lead Channel Setting Sensing Sensitivity: 0.6 mV

## 2020-11-28 DIAGNOSIS — Z0000 Encounter for general adult medical examination without abnormal findings: Secondary | ICD-10-CM | POA: Diagnosis not present

## 2020-11-28 DIAGNOSIS — E785 Hyperlipidemia, unspecified: Secondary | ICD-10-CM | POA: Diagnosis not present

## 2020-11-28 DIAGNOSIS — I10 Essential (primary) hypertension: Secondary | ICD-10-CM | POA: Diagnosis not present

## 2020-11-28 DIAGNOSIS — E559 Vitamin D deficiency, unspecified: Secondary | ICD-10-CM | POA: Diagnosis not present

## 2020-11-28 DIAGNOSIS — E1122 Type 2 diabetes mellitus with diabetic chronic kidney disease: Secondary | ICD-10-CM | POA: Diagnosis not present

## 2020-11-28 DIAGNOSIS — Z794 Long term (current) use of insulin: Secondary | ICD-10-CM | POA: Diagnosis not present

## 2020-11-28 DIAGNOSIS — E1140 Type 2 diabetes mellitus with diabetic neuropathy, unspecified: Secondary | ICD-10-CM | POA: Diagnosis not present

## 2020-12-07 NOTE — Progress Notes (Signed)
Remote ICD transmission.   

## 2020-12-09 ENCOUNTER — Ambulatory Visit (INDEPENDENT_AMBULATORY_CARE_PROVIDER_SITE_OTHER): Payer: Medicare Other

## 2020-12-09 DIAGNOSIS — Z95810 Presence of automatic (implantable) cardiac defibrillator: Secondary | ICD-10-CM | POA: Diagnosis not present

## 2020-12-09 DIAGNOSIS — I5022 Chronic systolic (congestive) heart failure: Secondary | ICD-10-CM

## 2020-12-15 NOTE — Progress Notes (Signed)
EPIC Encounter for ICM Monitoring  Patient Name: Grace Robinson is a 87 y.o. female Date: 12/15/2020 Primary Care Physican: Shon Baton, MD Primary Cardiologist: Truitt Merle, NP Electrophysiologist: Vergie Living Pacing: 98.1% LastWeight:133lbs  Time in AT/AF(0.0%)    Spoke with patient and reports feeling well at this time.  Denies fluid symptoms.    OptivolThoracic impedancenormal.  Prescribed:  Furosemide20 mg take1 tablet (20 mg total) by mouth daily alternating (40 mg) every other day.  Potassium 10 mEq1tablet twice a day.  Labs: 11/10/2020 Creatinine 1.08, BUN 14, Potassium 4.0, Sodium 143, GFR 45-51  Recommendations:No changes and encouraged to call if experiencing any fluid symptoms.  Follow-up plan: ICM clinic phone appointment on2/06/2021. 91 day device clinic remote transmission1/27/2022.   EP/Cardiology Office Visits:1/10/2022withLori Gerhardt,NP.Recall 07/03/2021 with Dr Caryl Comes  Copy of ICM check sent to [place]   3 month ICM trend: 12/09/2020.    1 Year ICM trend:       [name], RN 12/15/2020 9:43 AM

## 2020-12-17 DIAGNOSIS — H61001 Unspecified perichondritis of right external ear: Secondary | ICD-10-CM | POA: Diagnosis not present

## 2020-12-17 DIAGNOSIS — L821 Other seborrheic keratosis: Secondary | ICD-10-CM | POA: Diagnosis not present

## 2020-12-17 DIAGNOSIS — Z85828 Personal history of other malignant neoplasm of skin: Secondary | ICD-10-CM | POA: Diagnosis not present

## 2020-12-17 DIAGNOSIS — D485 Neoplasm of uncertain behavior of skin: Secondary | ICD-10-CM | POA: Diagnosis not present

## 2020-12-17 DIAGNOSIS — D044 Carcinoma in situ of skin of scalp and neck: Secondary | ICD-10-CM | POA: Diagnosis not present

## 2021-01-13 ENCOUNTER — Ambulatory Visit (INDEPENDENT_AMBULATORY_CARE_PROVIDER_SITE_OTHER): Payer: Medicare Other

## 2021-01-13 DIAGNOSIS — Z95810 Presence of automatic (implantable) cardiac defibrillator: Secondary | ICD-10-CM

## 2021-01-13 DIAGNOSIS — I5022 Chronic systolic (congestive) heart failure: Secondary | ICD-10-CM | POA: Diagnosis not present

## 2021-01-16 NOTE — Progress Notes (Signed)
EPIC Encounter for ICM Monitoring  Patient Name: ARLIENE ROSENOW is a 87 y.o. female Date: 01/16/2021 Primary Care Physican: Shon Baton, MD Primary Cardiologist: Truitt Merle, NP Electrophysiologist: Vergie Living Pacing: 97.5% 3/18/2022Weight:142lbs  VT-NS (>4 beats, >130 bpm) 2 SVT: VT/VF Rx Withheld 1 Time in AT/AF <0.1 hr/day (<0.1%) Longest AT/AF 40 seconds    Spoke with patient and reports feeling well at this time.  Denies fluid symptoms.    OptivolThoracic impedancenormal.  Prescribed:  Furosemide20 mg take1 tablet (20 mg total) by mouth daily alternating (40 mg) every other day.  Potassium 10 mEq1tablet twice a day.  Labs: 11/10/2020 Creatinine 1.08, BUN 14, Potassium 4.0, Sodium 143, GFR 45-51  Recommendations:  No changes and encouraged to call if experiencing any fluid symptoms.  Follow-up plan: ICM clinic phone appointment on4/18/2022. 91 day device clinic remote transmission4/28/2022.   EP/Cardiology Office Visits: 02/20/2021 with Tommye Standard, PA.  Copy of ICM check sent to [place]  3 month ICM trend: 01/13/2021.    1 Year ICM trend:       [name], RN 01/16/2021 9:02 AM

## 2021-02-12 ENCOUNTER — Other Ambulatory Visit: Payer: Self-pay

## 2021-02-12 ENCOUNTER — Ambulatory Visit (HOSPITAL_COMMUNITY)
Admission: EM | Admit: 2021-02-12 | Discharge: 2021-02-12 | Disposition: A | Discharge status: Home / Self Care | Payer: Medicare Other | Attending: Family Medicine | Admitting: Family Medicine

## 2021-02-12 ENCOUNTER — Encounter (HOSPITAL_COMMUNITY): Payer: Self-pay | Admitting: Emergency Medicine

## 2021-02-12 DIAGNOSIS — J069 Acute upper respiratory infection, unspecified: Secondary | ICD-10-CM | POA: Diagnosis not present

## 2021-02-12 DIAGNOSIS — J029 Acute pharyngitis, unspecified: Secondary | ICD-10-CM | POA: Insufficient documentation

## 2021-02-12 DIAGNOSIS — R059 Cough, unspecified: Secondary | ICD-10-CM | POA: Diagnosis not present

## 2021-02-12 DIAGNOSIS — Z87891 Personal history of nicotine dependence: Secondary | ICD-10-CM | POA: Insufficient documentation

## 2021-02-12 DIAGNOSIS — Z20822 Contact with and (suspected) exposure to covid-19: Secondary | ICD-10-CM | POA: Insufficient documentation

## 2021-02-12 MED ORDER — LIDOCAINE VISCOUS HCL 2 % MT SOLN: 10.0000 mL | OROMUCOSAL | 0 refills | Status: DC | PRN

## 2021-02-12 MED ORDER — FLUTICASONE PROPIONATE 50 MCG/ACT NA SUSP: 1.0000 | Freq: Two times a day (BID) | NASAL | 2 refills | Status: DC

## 2021-02-12 NOTE — Discharge Instructions (Signed)
May try Coricidin products over-the-counter for congestion as these are heart safe

## 2021-02-12 NOTE — ED Triage Notes (Signed)
symptoms started Sunday with a sore throat and cough, general malaise and eadache, green phlegm.

## 2021-02-12 NOTE — ED Provider Notes (Signed)
[place]    CSN: [id number] Arrival date & time: 02/12/21  1359      History   Chief Complaint Chief Complaint  Patient presents with  . Cough    HPI Grace Robinson is a 87 y.o. female.   Patient presenting today with 4-day history of sore throat, postnasal drainage, cough productive of green mucus, malaise.  She denies fever, chills, body aches, headache, chest pain, shortness of breath, abdominal pain, nausea vomiting diarrhea.  So far has not been trying anything over-the-counter for symptoms.  No known sick contacts.  No known history of pulmonary disease or seasonal allergies.  Up-to-date on Covid vaccine boosters.     Past Medical History:  Diagnosis Date  . Biventricular ICD (implantable cardiac defibrillator) in place   . Cellulitis   . Change in bowel habits   . CHF (congestive heart failure) ([place])   . Colon polyps   . Diabetes mellitus    Type II  . Diarrhea   . Dyslipidemia   . HOH (hard of hearing)    left  . HTN (hypertension)   . Ischemic cardiomyopathy   . LBBB (left bundle branch block)    chronic  . Meniere's disease   . T wave oversensing 01/17/2014  . Ulcerative colitis   . Varicose veins    4 lazer  treatment each leg    Patient Active Problem List   Diagnosis Date Noted  . Degenerative tear of posterior horn of medial meniscus of left knee 04/14/2016  . Tear of lateral meniscus of left knee 04/14/2016  . Synovial plica of left knee 35/36/1443  . T wave oversensing 01/17/2014  . Fall 07/10/2013  . Paroxysmal ventricular tachycardia ([place]) 12/13/2012  . Biventricular implantable cardioverter-defibrillator -Medtronic 12/08/2011  . Atrial tachycardia ([place]) 12/08/2011  . HTN (hypertension) 05/19/2011  . CARDIOMYOPATHY, PRIMARY, DILATED 12/01/2010    Past Surgical History:  Procedure Laterality Date  . ABDOMINAL HYSTERECTOMY    . CARDIAC CATHETERIZATION  08/2011  . CHOLECYSTECTOMY    . COLONOSCOPY    . EP Study  08/22/08   . IMPLANTABLE CARDIOVERTER DEFIBRILLATOR GENERATOR CHANGE N/A 08/22/2013   Procedure: IMPLANTABLE CARDIOVERTER DEFIBRILLATOR GENERATOR CHANGE;  Surgeon: Deboraha Sprang, MD;  Location: [facility] CATH LAB;  Service: Cardiovascular;  Laterality: N/A;  . KNEE ARTHROSCOPY Left 04/14/2016   Procedure: LEFT KNEE ARTHROSCOPY, PARTIAL MEDIAL AND PARTIAL LATERAL MENISCECTOMY, MEDIAL PLICA;  Surgeon: Dorna Leitz, MD;  Location: [facility];  Service: Orthopedics;  Laterality: Left;  . mastoid sugery     in the setting of Meniere's disease  . TONSILLECTOMY AND ADENOIDECTOMY      OB History   No obstetric history on file.      Home Medications    Prior to Admission medications   Medication Sig Start Date End Date Taking? Authorizing Provider  aspirin (ASPIRIN ADULT) 325 MG tablet Take 1 tablet (325 mg total) by mouth daily. 02/06/20  Yes Burtis Junes, NP  atorvastatin (LIPITOR) 40 MG tablet Take 40 mg by mouth daily. 04/21/15  Yes [provider]  Biotin 5000 MCG CAPS Take 1 capsule by mouth daily.   Yes [provider]  Cholecalciferol (VITAMIN D-3) 25 MCG (1000 UT) CAPS Take 2 capsules by mouth daily.    Yes [provider]  cholestyramine (QUESTRAN) 4 g packet Take 1 packet (4 g total) by mouth daily. 07/17/20  Yes Esterwood, [name] S, PA-C  fluticasone (FLONASE) 50 MCG/ACT nasal spray Place 1 spray into  both nostrils in the morning and at bedtime. 02/12/21  Yes Volney American, PA-C  furosemide (LASIX) 20 MG tablet Take 1 tablet (20 mg total) by mouth daily. alternating (40 mg) every other day. 02/08/17  Yes Burtis Junes, NP  HUMALOG MIX 75/25 (75-25) 100 UNIT/ML SUSP injection Inject 32 Units into the skin 2 (two) times daily with a meal.  04/21/15  Yes [provider]  Insulin Aspart Prot & Aspart (INSULIN ASP PROT & ASP FLEXPEN) (70-30) 100 UNIT/ML SUPN Inject 32 Units into the skin in the morning and at bedtime. 06/23/20  Yes [provider]  lidocaine  (XYLOCAINE) 2 % solution Use as directed 10 mLs in the mouth or throat as needed for mouth pain. 02/12/21  Yes Volney American, PA-C  metoprolol succinate (TOPROL-XL) 100 MG 24 hr tablet Take 1 tablet by mouth 2 (two) times daily. 07/04/13  Yes [provider]  olmesartan (BENICAR) 40 MG tablet Take 40 mg by mouth daily. 01/17/18  Yes [provider]  potassium chloride (K-DUR) 10 MEQ tablet Take 1 tablet by mouth 2 (two) times daily. 04/29/19  Yes [provider]  vitamin B-12 (CYANOCOBALAMIN) 1000 MCG tablet Take 1,000 mcg by mouth daily.   Yes [provider]  vitamin E 400 UNIT capsule Take 400 Units by mouth daily.   Yes [provider]  acetaminophen (TYLENOL) 500 MG tablet Take 500 mg by mouth as needed for mild pain.    [provider]  dicyclomine (BENTYL) 10 MG capsule Take 1 capsule (10 mg total) by mouth 3 (three) times daily before meals. Take 1 tab 30 min before meals 07/17/20   Esterwood, [name] S, PA-C  Lancets (ONETOUCH DELICA PLUS [id number]) MISC CHECK BLOOD SUGAR TWICE DAILY 07/25/20   [provider]  [facility] VERIO test strip 2 (two) times daily. 07/25/20   [provider]  primidone (MYSOLINE) 50 MG tablet TAKE 1 TABLET(50 MG) BY MOUTH AT BEDTIME Patient not taking: Reported on 02/12/2021 08/21/20   Tat, Eustace Quail, DO    Family History Family History  Problem Relation Age of Onset  . Emphysema Father 15  . Heart disease Father   . Breast cancer Mother   . Lung cancer Mother   . Healthy Child   . Colon cancer Neg Hx   . Rectal cancer Neg Hx   . Stomach cancer Neg Hx   . Liver cancer Neg Hx   . Esophageal cancer Neg Hx     Social History Social History   Tobacco Use  . Smoking status: Former Smoker    Years: 1.00  . Smokeless tobacco: Never Used  . Tobacco comment: quit in 1970  Vaping Use  . Vaping Use: Never used  Substance Use Topics  . Alcohol use: No  . Drug use: No     Allergies    Carvedilol, Vancomycin, and Sudafed [pseudoephedrine]   Review of Systems Review of Systems Per HPI  Physical Exam Triage Vital Signs ED Triage Vitals  Enc Vitals Group     BP 02/12/21 1541 138/82     Pulse Rate 02/12/21 1541 90     Resp 02/12/21 1541 (!) 22     Temp 02/12/21 1541 99.1 F (37.3 C)     Temp Source 02/12/21 1541 Oral     SpO2 02/12/21 1541 100 %     Weight --      Height --      Head Circumference --  Peak Flow --      Pain Score 02/12/21 1536 7     Pain Loc --      Pain Edu? --      Excl. in [place]? --    No data found.  Updated Vital Signs BP 138/82 (BP Location: Right Arm)   Pulse 90   Temp 99.1 F (37.3 C) (Oral)   Resp (!) 22   SpO2 100%   Visual Acuity Right Eye Distance:   Left Eye Distance:   Bilateral Distance:    Right Eye Near:   Left Eye Near:    Bilateral Near:     Physical Exam Vitals and nursing note reviewed.  Constitutional:      Appearance: Normal appearance. She is not ill-appearing.  HENT:     Head: Atraumatic.     Right Ear: Tympanic membrane normal.     Left Ear: Tympanic membrane normal.     Nose: Rhinorrhea present.     Mouth/Throat:     Mouth: Mucous membranes are moist.     Pharynx: Oropharynx is clear. Posterior oropharyngeal erythema present. No oropharyngeal exudate.  Eyes:     Extraocular Movements: Extraocular movements intact.     Conjunctiva/sclera: Conjunctivae normal.  Cardiovascular:     Rate and Rhythm: Normal rate and regular rhythm.     Heart sounds: Normal heart sounds.  Pulmonary:     Effort: Pulmonary effort is normal. No respiratory distress.     Breath sounds: Normal breath sounds. No wheezing or rales.  Musculoskeletal:        General: Normal range of motion.     Cervical back: Normal range of motion and neck supple.  Skin:    General: Skin is warm and dry.  Neurological:     Mental Status: She is alert and oriented to person, place, and time.  Psychiatric:        Mood and Affect:  Mood normal.        Thought Content: Thought content normal.        Judgment: Judgment normal.      UC Treatments / Results  Labs (all labs ordered are listed, but only abnormal results are displayed) Labs Reviewed  SARS CORONAVIRUS 2 (TAT 6-24 HRS)    EKG   Radiology No results found.  Procedures Procedures (including critical care time)  Medications Ordered in UC Medications - No data to display  Initial Impression / Assessment and Plan / UC Course  I have reviewed the triage vital signs and the nursing notes.  Pertinent labs & imaging results that were available during my care of the patient were reviewed by me and considered in my medical decision making (see chart for details).     Vital signs and exam very reassuring today.  Suspect viral illness, will test for Covid and isolate until test results return.  Discussed Flonase, viscous lidocaine for symptomatic relief and may take Coricidin products over-the-counter for her congestion symptoms.  Return for acutely worsening symptoms at any time.  Final Clinical Impressions(s) / UC Diagnoses   Final diagnoses:  Viral URI with cough     Discharge Instructions     May try Coricidin products over-the-counter for congestion as these are heart safe    ED Prescriptions    Medication Sig Dispense Auth. Provider   fluticasone (FLONASE) 50 MCG/ACT nasal spray Place 1 spray into both nostrils in the morning and at bedtime. 16 g Volney American, PA-C   lidocaine (XYLOCAINE) 2 %  solution Use as directed 10 mLs in the mouth or throat as needed for mouth pain. 100 mL Volney American, [place]     PDMP not reviewed this encounter.   Volney American, [place] 02/12/21 [id number]

## 2021-02-13 LAB — SARS CORONAVIRUS 2 (TAT 6-24 HRS): SARS Coronavirus 2: NEGATIVE

## 2021-02-16 ENCOUNTER — Ambulatory Visit (INDEPENDENT_AMBULATORY_CARE_PROVIDER_SITE_OTHER): Payer: Medicare Other

## 2021-02-16 DIAGNOSIS — Z95810 Presence of automatic (implantable) cardiac defibrillator: Secondary | ICD-10-CM

## 2021-02-16 DIAGNOSIS — I5022 Chronic systolic (congestive) heart failure: Secondary | ICD-10-CM

## 2021-02-18 ENCOUNTER — Telehealth: Payer: Self-pay

## 2021-02-18 NOTE — Progress Notes (Signed)
EPIC Encounter for ICM Monitoring  Patient Name: Grace Robinson is a 87 y.o. female Date: 02/18/2021 Primary Care Physican: Shon Baton, MD Primary Cardiologist: Truitt Merle, NP Electrophysiologist: Vergie Living Pacing: 98.1% 3/18/2022Weight:142lbs  Time in AT/AF (0.0%)     Attempted call to patient and unable to reach.  Left detailed message per DPR regarding transmission. Transmission reviewed.   OptivolThoracic impedancenormal.  Prescribed:  Furosemide20 mg take1 tablet (20 mg total) by mouth daily alternating (40 mg) every other day.  Potassium 10 mEq1tablet twice a day.  Labs: 11/10/2020 Creatinine 1.08, BUN 14, Potassium 4.0, Sodium 143, GFR 45-51  Recommendations:  Left voice mail with ICM number and encouraged to call if experiencing any fluid symptoms.  Follow-up plan: ICM clinic phone appointment on5/24/2022. 91 day device clinic remote transmission4/28/2022.   EP/Cardiology Office Visits: Pt Canceled 02/20/2021 with Tommye Standard, PA due to sickness, needs to reschedule.  Copy of ICM check sent to [place]  3 month ICM trend: 02/16/2021.    1 Year ICM trend:       [name], RN 02/18/2021 9:35 AM

## 2021-02-18 NOTE — Telephone Encounter (Signed)
Remote ICM transmission received.  Attempted call to patient regarding ICM remote transmission and left detailed message per DPR.  Advised to return call for any fluid symptoms or questions. Next ICM remote transmission scheduled 03/24/2021.

## 2021-02-20 ENCOUNTER — Ambulatory Visit: Payer: Medicare Other | Admitting: Physician Assistant

## 2021-02-26 ENCOUNTER — Ambulatory Visit (INDEPENDENT_AMBULATORY_CARE_PROVIDER_SITE_OTHER): Payer: Medicare Other

## 2021-02-26 DIAGNOSIS — I428 Other cardiomyopathies: Secondary | ICD-10-CM | POA: Diagnosis not present

## 2021-02-26 LAB — CUP PACEART REMOTE DEVICE CHECK
Battery Remaining Longevity: 16 mo
Battery Voltage: 2.89 V
Brady Statistic AP VP Percent: 29.55 %
Brady Statistic AP VS Percent: 0.53 %
Brady Statistic AS VP Percent: 68.88 %
Brady Statistic AS VS Percent: 1.04 %
Brady Statistic RA Percent Paced: 29.84 %
Brady Statistic RV Percent Paced: 0.48 %
Date Time Interrogation Session: 20220428012405
HighPow Impedance: 56 Ohm
HighPow Impedance: 78 Ohm
Implantable Lead Implant Date: 20031007
Implantable Lead Implant Date: 20031007
Implantable Lead Implant Date: 20031007
Implantable Lead Implant Date: 20060227
Implantable Lead Location: 753858
Implantable Lead Location: 753859
Implantable Lead Location: 753860
Implantable Lead Location: 753860
Implantable Lead Model: 4193
Implantable Lead Model: 4469
Implantable Lead Model: 4470
Implantable Lead Model: 6949
Implantable Lead Serial Number: 337988
Implantable Lead Serial Number: 361236
Implantable Pulse Generator Implant Date: 20141022
Lead Channel Impedance Value: 361 Ohm
Lead Channel Impedance Value: 4047 Ohm
Lead Channel Impedance Value: 4047 Ohm
Lead Channel Impedance Value: 456 Ohm
Lead Channel Impedance Value: 475 Ohm
Lead Channel Impedance Value: 475 Ohm
Lead Channel Pacing Threshold Amplitude: 0.75 V
Lead Channel Pacing Threshold Amplitude: 1 V
Lead Channel Pacing Threshold Amplitude: 1.25 V
Lead Channel Pacing Threshold Pulse Width: 0.4 ms
Lead Channel Pacing Threshold Pulse Width: 0.4 ms
Lead Channel Pacing Threshold Pulse Width: 0.4 ms
Lead Channel Sensing Intrinsic Amplitude: 1.5 mV
Lead Channel Sensing Intrinsic Amplitude: 1.5 mV
Lead Channel Sensing Intrinsic Amplitude: 31.625 mV
Lead Channel Sensing Intrinsic Amplitude: 31.625 mV
Lead Channel Setting Pacing Amplitude: 2 V
Lead Channel Setting Pacing Amplitude: 2 V
Lead Channel Setting Pacing Amplitude: 2.5 V
Lead Channel Setting Pacing Pulse Width: 0.4 ms
Lead Channel Setting Pacing Pulse Width: 0.4 ms
Lead Channel Setting Sensing Sensitivity: 0.6 mV

## 2021-03-18 NOTE — Progress Notes (Signed)
Remote ICD transmission.   

## 2021-03-23 ENCOUNTER — Ambulatory Visit (INDEPENDENT_AMBULATORY_CARE_PROVIDER_SITE_OTHER): Payer: Medicare Other

## 2021-03-23 DIAGNOSIS — Z95810 Presence of automatic (implantable) cardiac defibrillator: Secondary | ICD-10-CM | POA: Diagnosis not present

## 2021-03-23 DIAGNOSIS — I5022 Chronic systolic (congestive) heart failure: Secondary | ICD-10-CM

## 2021-03-25 ENCOUNTER — Telehealth: Payer: Self-pay

## 2021-03-25 NOTE — Telephone Encounter (Signed)
Remote ICM transmission received.  Attempted call to patient regarding ICM remote transmission and left detailed message per DPR.  Advised to return call for any fluid symptoms or questions. Next ICM remote transmission scheduled 04/27/2021.

## 2021-03-25 NOTE — Progress Notes (Signed)
EPIC Encounter for ICM Monitoring  Patient Name: Grace Robinson is a 87 y.o. female Date: 03/25/2021 Primary Care Physican: Shon Baton, MD Primary Cardiologist: Truitt Merle, NP Electrophysiologist: Vergie Living Pacing: 97.6% 3/18/2022Weight:142lbs  Time in AT/AF  (0.0%)     Attempted call to patient and unable to reach.  Left detailed message per DPR regarding transmission. Transmission reviewed.   OptivolThoracic impedancenormal.  Prescribed:  Furosemide20 mg take1 tablet (20 mg total) by mouth daily alternating (40 mg) every other day.  Potassium 10 mEq1tablet twice a day.  Labs: 11/10/2020 Creatinine 1.08, BUN 14, Potassium 4.0, Sodium 143, GFR 45-51  Recommendations:Left voice mail with ICM number and encouraged to call if experiencing any fluid symptoms.  Follow-up plan: ICM clinic phone appointment on6/27/2022. 91 day device clinic remote transmission7/28/2022.   EP/Cardiology Office Visits: 03/31/2021 with Tommye Standard, PA.  Copy of ICM check sent to [place]   3 month ICM trend: 03/23/2021.    1 Year ICM trend:       [name], RN 03/25/2021 7:59 AM

## 2021-03-30 NOTE — Progress Notes (Signed)
Cardiology Office Note Date:  03/31/2021  Patient ID:  Grace Robinson, Grace Robinson 02/10/1926, MRN [id number] PCP:  Shon Baton, MD  Cardiologist:  Dr. Glynda Jaeger. Servando Snare, NP (retired) Electrophysiologist: Dr. Caryl Comes    Chief Complaint:  4 mo visit  History of Present Illness: Grace Robinson is a 87 y.o. female with history of NICM, VT, chronic CHF (systolic), DM, HTN, HLD, LBBB, CRT-D, ulcerative colitis, Meniere's disease, suspect TIA  Recovered LVEF by echo 2015  Resides at [facility]  She comes in today to be seen for Dr. Caryl Comes, last seen by him via telehealth 07/2020, at that time, mentions hx of having had ATP in 2019 and 2020.  No changes were made, she was doing well  She saw L. Servando Snare, NP in Jan 2022, notes back in April 2021, has transient symptoms of R hand weakness, CT and carotids were OK, she declined referral to neurology At this visit in Jan she was accompanied by her son, maintained on full dose ASA after TIA-like event. Dependent edema noted variocities, weight was stable, support stockings helped.   TODAY This is the 1st visit in  Clinic with EP for a couple years. She is accompanied by her son. She feels like she is doing well. No cardiac awareness, no CP, palpitations Denies rest SOB, minimal DOE with longer walking. No near syncope or syncope. Chronic balance issues, denies falls  Her PMD does her labs, lipids    Device information MDT CRT-D implanted 2003, RV ICD lead implanted 2006, last gen change 2014 She has an abandoned RV pacing lead  Past Medical History:  Diagnosis Date  . Biventricular ICD (implantable cardiac defibrillator) in place   . Cellulitis   . Change in bowel habits   . CHF (congestive heart failure) ([place])   . Colon polyps   . Diabetes mellitus    Type II  . Diarrhea   . Dyslipidemia   . HOH (hard of hearing)    left  . HTN (hypertension)   . Ischemic cardiomyopathy   . LBBB (left bundle branch block)    chronic  .  Meniere's disease   . T wave oversensing 01/17/2014  . Ulcerative colitis   . Varicose veins    4 lazer  treatment each leg    Past Surgical History:  Procedure Laterality Date  . ABDOMINAL HYSTERECTOMY    . CARDIAC CATHETERIZATION  08/2011  . CHOLECYSTECTOMY    . COLONOSCOPY    . EP Study  08/22/08  . IMPLANTABLE CARDIOVERTER DEFIBRILLATOR GENERATOR CHANGE N/A 08/22/2013   Procedure: IMPLANTABLE CARDIOVERTER DEFIBRILLATOR GENERATOR CHANGE;  Surgeon: Deboraha Sprang, MD;  Location: [facility] CATH LAB;  Service: Cardiovascular;  Laterality: N/A;  . KNEE ARTHROSCOPY Left 04/14/2016   Procedure: LEFT KNEE ARTHROSCOPY, PARTIAL MEDIAL AND PARTIAL LATERAL MENISCECTOMY, MEDIAL PLICA;  Surgeon: Dorna Leitz, MD;  Location: [facility];  Service: Orthopedics;  Laterality: Left;  . mastoid sugery     in the setting of Meniere's disease  . TONSILLECTOMY AND ADENOIDECTOMY      Current Outpatient Medications  Medication Sig Dispense Refill  . acetaminophen (TYLENOL) 500 MG tablet Take 500 mg by mouth as needed for mild pain.    Marland Kitchen aspirin (ASPIRIN ADULT) 325 MG tablet Take 1 tablet (325 mg total) by mouth daily.    Marland Kitchen atorvastatin (LIPITOR) 40 MG tablet Take 40 mg by mouth daily.  3  . Biotin 5000 MCG CAPS Take 1 capsule by mouth daily.    Marland Kitchen  Cholecalciferol (VITAMIN D-3) 25 MCG (1000 UT) CAPS Take 2 capsules by mouth daily.     . cholestyramine (QUESTRAN) 4 g packet Take 1 packet (4 g total) by mouth daily. 30 each 11  . dicyclomine (BENTYL) 10 MG capsule Take 1 capsule (10 mg total) by mouth 3 (three) times daily before meals. Take 1 tab 30 min before meals 90 capsule 11  . furosemide (LASIX) 20 MG tablet Take 1 tablet (20 mg total) by mouth daily. alternating (40 mg) every other day. 60 tablet 11  . HUMALOG MIX 75/25 (75-25) 100 UNIT/ML SUSP injection Inject 32 Units into the skin 2 (two) times daily with a meal.   1  . Insulin Aspart Prot & Aspart (INSULIN ASP PROT & ASP FLEXPEN) (70-30) 100 UNIT/ML SUPN Inject  32 Units into the skin in the morning and at bedtime.    . Lancets (ONETOUCH DELICA PLUS [id number]) MISC CHECK BLOOD SUGAR TWICE DAILY    . metoprolol succinate (TOPROL-XL) 100 MG 24 hr tablet Take 1 tablet by mouth 2 (two) times daily.    Marland Kitchen olmesartan (BENICAR) 40 MG tablet Take 40 mg by mouth daily.  3  . ONETOUCH VERIO test strip 2 (two) times daily.    . potassium chloride (K-DUR) 10 MEQ tablet Take 1 tablet by mouth 2 (two) times daily.    . primidone (MYSOLINE) 50 MG tablet TAKE 1 TABLET(50 MG) BY MOUTH AT BEDTIME 90 tablet 0  . vitamin B-12 (CYANOCOBALAMIN) 1000 MCG tablet Take 1,000 mcg by mouth daily.    . vitamin E 400 UNIT capsule Take 400 Units by mouth daily.     No current facility-administered medications for this visit.    Allergies:   Carvedilol, Vancomycin, and Sudafed [pseudoephedrine]   Social History:  The patient  reports that she has quit smoking. She quit after 1.00 year of use. She has never used smokeless tobacco. She reports that she does not drink alcohol and does not use drugs.   Family History:  The patient's family history includes Breast cancer in her mother; Emphysema (age of onset: 77) in her father; Healthy in her child; Heart disease in her father; Lung cancer in her mother.  ROS:  Please see the history of present illness.    All other systems are reviewed and otherwise negative.   PHYSICAL EXAM:  VS:  BP (!) 160/94   Pulse 76   Ht 5' 6.5" (1.689 m)   Wt 138 lb (62.6 kg)   SpO2 97%   BMI 21.94 kg/m  BMI: Body mass index is 21.94 kg/m. Well nourished, well developed, in no acute distress HEENT: normocephalic, atraumatic Neck: no JVD, carotid bruits or masses Cardiac:  RRR; no significant murmurs, no rubs, or gallops Lungs:  CTA b/l, no wheezing, rhonchi or rales Abd: soft, nontender MS: no deformity, age appropriate atrophyatrophy Ext: trace edema, chronic looking skin changes Skin: warm and dry, no rash Neuro:  No gross deficits  appreciated Psych: euthymic mood, full affect  ICD site is stable, no tethering or discomfort   EKG:  Done today and reviewed by myself shows  AV paced 76bpm  Device interrogation done today and reviewed by myself:  Battery and lead measurements are stable 98% BP  arrhythmia log is since Feb 2019 Treated VT 3 2 were previously know 2019 and 2020 Last MMVT 20/10/2020 treated successfully with ATP (VT rate 150bpm) Several NSVTs/SVT with wavelet Some are NSVT others PATs (not all have EGMs) 2 AFib episodes  12/18/20: 40 seconds: true AFib flutter, rate controlled Longest 70min 37sec on 10/14/20   CarotidSummaryApril 2021:  Right Carotid: Velocities in the right ICA are consistent with a 1-39%  stenosis.   Left Carotid: Velocities in the left ICA are consistent with a 1-39%  stenosis.   Vertebrals: Bilateral vertebral arteries demonstrate antegrade flow.  Subclavians: Normal flow hemodynamics were seen in bilateral subclavian arteries.    CT HEADIMPRESSION4/2021: 1. No acute intracranial abnormality. If there is persisting clinical concern for acute/subacute ischemia, MRI could be obtained. 2. Moderate parenchymal volume loss and chronic microvascular angiopathy. 3. Prior left mastoidectomy.  Electronically Signed By: Lovena Le M.D. On: 02/14/2020 23:51   Echo Study Conclusions from 07/2014 - Left ventricle: The cavity size was normal. Systolic function was normal. The estimated ejection fraction was in the range of 50% to 55%. There was an increased relative contribution of atrial contraction to ventricular filling. Doppler parameters are consistent with abnormal left ventricular relaxation (grade 1 diastolic dysfunction). - Ventricular septum: Septal motion showed paradox. These changes are consistent with right ventricular pacing. - Aortic valve: There was mild regurgitation. - Mitral valve: There is a calcified mobile density in the  LV cavity most consistent with calcified redundant chordae tendinae. This was present on echo in 2014. There was mild regurgitation. - Pulmonic valve: There was trivial regurgitation.   Recent Labs: 11/10/2020: ALT 18; BUN 14; Creatinine, Ser 1.08; Hemoglobin 14.6; Platelets 177; Potassium 4.0; Sodium 143  11/10/2020: Chol/HDL Ratio 2.7; Cholesterol, Total 152; HDL 57; LDL Chol Calc (NIH) 68; Triglycerides 159   CrCl cannot be calculated (Patient's most recent lab result is older than the maximum 21 days allowed.).   Wt Readings from Last 3 Encounters:  03/31/21 138 lb (62.6 kg)  11/10/20 142 lb 9.6 oz (64.7 kg)  08/05/20 142 lb (64.4 kg)     Other studies reviewed: Additional studies/records reviewed today include: summarized above  ASSESSMENT AND PLAN:  1. ICD     Intact function, no programming changes made  2. NICM     Recovered LVEF by last echo in 2015     On BB, ARB, diuretic  3. HTN     Unusually high for her, reports "always happens at the doctor office"    No changes, following at home  4. TIA     She has had 2 short Afib episodes, long 1.58minutes.     Discussed with the patient and her son today, none previously     No a/c for now, monitor via her device  5. VT     + MMVT treated by ATP last year, asymptomatic     No changes for now    Disposition: F/u with remotes as usual she likes Q 33mo visits, we will see her back sooner if needed    Current medicines are reviewed at length with the patient today.  The patient did not have any concerns regarding medicines.  Venetia Night, PA-C 03/31/2021 4:56 PM     [address] [place] [address] [phone] (office)  [phone] (fax)

## 2021-03-31 ENCOUNTER — Ambulatory Visit: Payer: Medicare Other | Admitting: Physician Assistant

## 2021-03-31 ENCOUNTER — Other Ambulatory Visit: Payer: Self-pay

## 2021-03-31 ENCOUNTER — Encounter: Payer: Self-pay | Admitting: Physician Assistant

## 2021-03-31 VITALS — BP 160/94 | HR 76 | Ht 66.5 in | Wt 138.0 lb

## 2021-03-31 DIAGNOSIS — I480 Paroxysmal atrial fibrillation: Secondary | ICD-10-CM

## 2021-03-31 DIAGNOSIS — I428 Other cardiomyopathies: Secondary | ICD-10-CM | POA: Diagnosis not present

## 2021-03-31 DIAGNOSIS — Z95810 Presence of automatic (implantable) cardiac defibrillator: Secondary | ICD-10-CM | POA: Diagnosis not present

## 2021-03-31 DIAGNOSIS — I5022 Chronic systolic (congestive) heart failure: Secondary | ICD-10-CM

## 2021-03-31 DIAGNOSIS — I472 Ventricular tachycardia, unspecified: Secondary | ICD-10-CM

## 2021-03-31 NOTE — Patient Instructions (Addendum)
Medication Instructions:   Your physician recommends that you continue on your current medications as directed. Please refer to the Current Medication list given to you today.  *If you need a refill on your cardiac medications before your next appointment, please call your pharmacy*   Lab Work: [place]   If you have labs (blood work) drawn today and your tests are completely normal, you will receive your results only by: Marland Kitchen MyChart Message (if you have MyChart) OR . A paper copy in the mail If you have any lab test that is abnormal or we need to change your treatment, we will call you to review the results.   Testing/Procedures: NONE ORDERED  TODAY   Follow-Up: At [facility], you and your health needs are our priority.  As part of our continuing mission to provide you with exceptional heart care, we have created designated Provider Care Teams.  These Care Teams include your primary Cardiologist (physician) and Advanced Practice Providers (APPs -  Physician Assistants and Nurse Practitioners) who all work together to provide you with the care you need, when you need it.  We recommend signing up for the patient portal called "MyChart".  Sign up information is provided on this After Visit Summary.  MyChart is used to connect with patients for Virtual Visits (Telemedicine).  Patients are able to view lab/test results, encounter notes, upcoming appointments, etc.  Non-urgent messages can be sent to your provider as well.   To learn more about what you can do with MyChart, go to [email or web address].    Your next appointment:   4 month(s)  The format for your next appointment:   In Person only   Provider:   Virl Axe, MD   Other Instructions

## 2021-04-27 ENCOUNTER — Ambulatory Visit (INDEPENDENT_AMBULATORY_CARE_PROVIDER_SITE_OTHER): Payer: Medicare Other

## 2021-04-27 DIAGNOSIS — I5022 Chronic systolic (congestive) heart failure: Secondary | ICD-10-CM

## 2021-04-27 DIAGNOSIS — Z95810 Presence of automatic (implantable) cardiac defibrillator: Secondary | ICD-10-CM | POA: Diagnosis not present

## 2021-04-27 NOTE — Progress Notes (Signed)
EPIC Encounter for ICM Monitoring  Patient Name: Grace Robinson is a 87 y.o. female Date: 04/27/2021 Primary Care Physican: Shon Baton, MD Primary Cardiologist: Truitt Merle, NP Electrophysiologist: Vergie Living Pacing:  96.9%     03/31/2021 Office Weight: 138 lbs   VT-NS (>4 beats, >130 bpm) 7  Time in AT/AF <0.1 hr/day (<0.1%) Longest AT/AF 11 seconds           Spoke with patient and heart failure questions reviewed.  Pt asymptomatic for fluid accumulation and feeing well.     Optivol Thoracic impedance normal.      Prescribed:  Furosemide 20 mg take 1 tablet (20 mg total) by mouth daily alternating (40 mg) every other day.   Potassium 10 mEq 1 tablet twice a day.   Labs: 11/10/2020 Creatinine 1.08, BUN 14, Potassium 4.0, Sodium 143, GFR 45-51   Recommendations:  No changes and encouraged to call if experiencing any fluid symptoms.   Follow-up plan: ICM clinic phone appointment on 06/01/2021.   91 day device clinic remote transmission 05/28/2021.      EP/Cardiology Office Visits:  Recall 07/03/2021 with Dr Caryl Comes.   Copy of ICM check sent to Dr. Caryl Comes    3 month ICM trend: 04/27/2021.    1 Year ICM trend:       [name], RN 04/27/2021 10:07 AM

## 2021-04-30 DIAGNOSIS — E119 Type 2 diabetes mellitus without complications: Secondary | ICD-10-CM | POA: Diagnosis not present

## 2021-05-10 ENCOUNTER — Other Ambulatory Visit: Payer: Self-pay | Admitting: Family Medicine

## 2021-05-28 ENCOUNTER — Ambulatory Visit: Payer: Medicare Other | Admitting: Physician Assistant

## 2021-05-28 ENCOUNTER — Ambulatory Visit (INDEPENDENT_AMBULATORY_CARE_PROVIDER_SITE_OTHER): Payer: Medicare Other

## 2021-05-28 DIAGNOSIS — I5022 Chronic systolic (congestive) heart failure: Secondary | ICD-10-CM

## 2021-05-29 LAB — CUP PACEART REMOTE DEVICE CHECK
Battery Remaining Longevity: 11 mo
Battery Voltage: 2.87 V
Brady Statistic AP VP Percent: 36.26 %
Brady Statistic AP VS Percent: 0.55 %
Brady Statistic AS VP Percent: 62.22 %
Brady Statistic AS VS Percent: 0.97 %
Brady Statistic RA Percent Paced: 36.29 %
Brady Statistic RV Percent Paced: 0.22 %
Date Time Interrogation Session: 20220728144346
HighPow Impedance: 53 Ohm
HighPow Impedance: 73 Ohm
Implantable Lead Implant Date: 20031007
Implantable Lead Implant Date: 20031007
Implantable Lead Implant Date: 20031007
Implantable Lead Implant Date: 20060227
Implantable Lead Location: 753858
Implantable Lead Location: 753859
Implantable Lead Location: 753860
Implantable Lead Location: 753860
Implantable Lead Model: 4193
Implantable Lead Model: 4469
Implantable Lead Model: 4470
Implantable Lead Model: 6949
Implantable Lead Serial Number: 337988
Implantable Lead Serial Number: 361236
Implantable Pulse Generator Implant Date: 20141022
Lead Channel Impedance Value: 342 Ohm
Lead Channel Impedance Value: 4047 Ohm
Lead Channel Impedance Value: 4047 Ohm
Lead Channel Impedance Value: 475 Ohm
Lead Channel Impedance Value: 475 Ohm
Lead Channel Impedance Value: 532 Ohm
Lead Channel Pacing Threshold Amplitude: 0.625 V
Lead Channel Pacing Threshold Amplitude: 0.875 V
Lead Channel Pacing Threshold Amplitude: 1.5 V
Lead Channel Pacing Threshold Pulse Width: 0.4 ms
Lead Channel Pacing Threshold Pulse Width: 0.4 ms
Lead Channel Pacing Threshold Pulse Width: 0.4 ms
Lead Channel Sensing Intrinsic Amplitude: 1.125 mV
Lead Channel Sensing Intrinsic Amplitude: 1.125 mV
Lead Channel Sensing Intrinsic Amplitude: 29.875 mV
Lead Channel Sensing Intrinsic Amplitude: 29.875 mV
Lead Channel Setting Pacing Amplitude: 2 V
Lead Channel Setting Pacing Amplitude: 2 V
Lead Channel Setting Pacing Amplitude: 3 V
Lead Channel Setting Pacing Pulse Width: 0.4 ms
Lead Channel Setting Pacing Pulse Width: 0.4 ms
Lead Channel Setting Sensing Sensitivity: 0.6 mV

## 2021-06-01 ENCOUNTER — Encounter: Payer: Self-pay | Admitting: Physician Assistant

## 2021-06-01 ENCOUNTER — Ambulatory Visit: Payer: Medicare Other | Admitting: Physician Assistant

## 2021-06-01 ENCOUNTER — Ambulatory Visit (INDEPENDENT_AMBULATORY_CARE_PROVIDER_SITE_OTHER): Payer: Medicare Other

## 2021-06-01 VITALS — BP 134/80 | HR 72 | Ht 65.5 in | Wt 141.5 lb

## 2021-06-01 DIAGNOSIS — I5022 Chronic systolic (congestive) heart failure: Secondary | ICD-10-CM | POA: Diagnosis not present

## 2021-06-01 DIAGNOSIS — Z95810 Presence of automatic (implantable) cardiac defibrillator: Secondary | ICD-10-CM

## 2021-06-01 DIAGNOSIS — K580 Irritable bowel syndrome with diarrhea: Secondary | ICD-10-CM | POA: Diagnosis not present

## 2021-06-01 MED ORDER — CHOLESTYRAMINE 4 G PO PACK: 4.0000 g | PACK | Freq: Every day | ORAL | 11 refills | Status: DC

## 2021-06-01 MED ORDER — DICYCLOMINE HCL 10 MG PO CAPS: 10.0000 mg | ORAL_CAPSULE | Freq: Three times a day (TID) | ORAL | 11 refills | Status: DC

## 2021-06-01 NOTE — Progress Notes (Signed)
Subjective:    Patient ID: Grace Robinson, female    DOB: 05/14/1926, 87 y.o.   MRN: [id number]  HPI  Grace Robinson is a pleasant 87 year old white female, established with Dr. Silverio Decamp  and known to myself who comes in today for annual follow-up. 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 Also with history of hypertension, nonischemic cardiomyopathy-status post ICD ,and insulin-dependent diabetes mellitus.  She is status post remote cholecystectomy She has been on a regimen of Bentyl 10 mg 3 times daily AC, uses Imodium AD as needed, and takes Questran 4 g once daily which she reports she is usually been taking after dinner. She says overall she has been doing well over the past year.  She continues to have intermittent episodes of diarrhea despite the above regimen.  She will randomly have episodes of diarrhea that last for a few days and then resolved.  She had 1 of these last week and took the Imodium.  She is back to her usual 1 soft to mushy bowel movement per day currently.  She has no complaints of abdominal pain or cramping, appetite has been good, weight has been stable. She is asking if there is a diet that she should be following.  She is lactose intolerant and takes Lactaid tablets whenever she is going to consume lactose.  She has been using artificial sweeteners as she is diabetic.  She knows that acidic foods aggravate her diarrhea and generally avoids them.  Review of Systems Pertinent positive and negative review of systems were noted in the above HPI section.  All other review of systems was otherwise negative.   Outpatient Encounter Medications as of 06/01/2021  Medication Sig   acetaminophen (TYLENOL) 500 MG tablet Take 500 mg by mouth as needed for mild pain.   aspirin (ASPIRIN ADULT) 325 MG tablet Take 1 tablet (325 mg total) by mouth daily.   atorvastatin (LIPITOR) 40 MG tablet Take 40 mg by mouth daily.   Biotin 5000 MCG CAPS Take 1 capsule by  mouth daily.   Cholecalciferol (VITAMIN D-3) 25 MCG (1000 UT) CAPS Take 2 capsules by mouth daily.    furosemide (LASIX) 20 MG tablet Take 1 tablet (20 mg total) by mouth daily. alternating (40 mg) every other day.   HUMALOG MIX 75/25 (75-25) 100 UNIT/ML SUSP injection Inject 32 Units into the skin 2 (two) times daily with a meal.    Lancets (ONETOUCH DELICA PLUS [id number]) MISC CHECK BLOOD SUGAR TWICE DAILY   loperamide (IMODIUM A-D) 2 MG tablet Take 2 mg by mouth as needed for diarrhea or loose stools.   metoprolol succinate (TOPROL-XL) 100 MG 24 hr tablet Take 1 tablet by mouth 2 (two) times daily.   olmesartan (BENICAR) 40 MG tablet Take 40 mg by mouth daily.   ONETOUCH VERIO test strip 2 (two) times daily.   potassium chloride (K-DUR) 10 MEQ tablet Take 1 tablet by mouth 2 (two) times daily.   primidone (MYSOLINE) 50 MG tablet TAKE 1 TABLET(50 MG) BY MOUTH AT BEDTIME (Patient taking differently: Take 25 mg by mouth as needed.)   vitamin B-12 (CYANOCOBALAMIN) 1000 MCG tablet Take 1,000 mcg by mouth daily.   vitamin E 400 UNIT capsule Take 400 Units by mouth daily.   [DISCONTINUED] cholestyramine (QUESTRAN) 4 g packet Take 1 packet (4 g total) by mouth daily.   [DISCONTINUED] dicyclomine (BENTYL) 10 MG capsule Take 1 capsule (10 mg total) by mouth 3 (three) times daily  before meals. Take 1 tab 30 min before meals   cholestyramine (QUESTRAN) 4 g packet Take 1 packet (4 g total) by mouth daily.   dicyclomine (BENTYL) 10 MG capsule Take 1 capsule (10 mg total) by mouth 3 (three) times daily before meals. Take 1 tab 30 min before meals   [DISCONTINUED] Insulin Aspart Prot & Aspart (INSULIN ASP PROT & ASP FLEXPEN) (70-30) 100 UNIT/ML SUPN Inject 32 Units into the skin in the morning and at bedtime.   No facility-administered encounter medications on file as of 06/01/2021.   Allergies  Allergen Reactions   Carvedilol Other (See Comments)   Vancomycin Other (See Comments)   Sudafed  [Pseudoephedrine] Other (See Comments)    Makes skin feel like its crawling   Patient Active Problem List   Diagnosis Date Noted   Degenerative tear of posterior horn of medial meniscus of left knee 04/14/2016   Tear of lateral meniscus of left knee [id number]   Synovial plica of left knee [id number]   T wave oversensing 01/17/2014   Fall 07/10/2013   Paroxysmal ventricular tachycardia (HCC) 12/13/2012   Biventricular implantable cardioverter-defibrillator -Medtronic 12/08/2011   Atrial tachycardia ([place]) 12/08/2011   HTN (hypertension) 05/19/2011   CARDIOMYOPATHY, PRIMARY, DILATED 12/01/2010   Social History   Socioeconomic History   Marital status: Single    Spouse name: Not on file   Number of children: 2   Years of education: Not on file   Highest education level: Bachelor's degree (e.g., BA, AB, BS)  Occupational History   Occupation: retired    Comment: Pharmacist, 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    Social Determinants of Health   Financial Resource Strain: Not on file  Food Insecurity: Not on file  Transportation Needs: Not on file  Physical Activity: Not on file  Stress: Not on file  Social Connections: Not on file  Intimate Partner Violence: Not on file    Grace Robinson's family history includes Breast cancer in her mother; Emphysema (age of onset: 58) in her father; Healthy in her child; Heart disease in her father; Lung cancer in her mother.      Objective:    Vitals:   06/01/21 1350  BP: 134/80  Pulse: 72    Physical Exam Well-developed well-nourished elderly WF in  no acute distress.  Height, Weight, 141 BMI 23.19  HEENT; nontraumatic normocephalic, EOMI, PE R LA, sclera anicteric. Oropharynx;not examined today  Neck; supple, no JVD Cardiovascular; regular rate and rhythm with S1-S2, no murmur rub or gallop Pulmonary; Clear bilaterally Abdomen; soft, nontender, nondistended, no palpable mass or hepatosplenomegaly, bowel sounds are active Rectal;not done today  Skin; benign exam, no jaundice rash or appreciable lesions Extremities; no clubbing cyanosis or edema skin warm and dry Neuro/Psych; alert and oriented x4, grossly nonfocal mood and affect appropriate        Assessment & Plan:   #1 87 year old white female with history of IBS-D, and probable component of bile salt induced diarrhea here for regular follow-up.  Symptoms have been stable over the past year with occasional episodes of diarrhea which may last for a day or 2 despite her usual regimen.  Consider SIBO  #2 status post remote cholecystectomy 3.  Insulin-dependent diabetes mellitus 4.  Nonischemic cardiomyopathy status post ICD  Plan; we discussed avoidance of any known trigger foods i.e. acidic foods, minimizing lactose intake, also discussed avoidance of artificial sweeteners other than Stevia/trivia. Refill Questran 4 g daily-suggested she take at least an hour after dinner or 2 hours after dinner. Continue Bentyl 10 mg p.o. 3 times daily AC Continue Imodium as needed, using only for episodic diarrhea Consider empiric course of Xifaxan.  We discussed this today she will monitor her symptoms over the next couple of months and if she feels she is having more issues with episodic diarrhea and gas she will call back and can prescribe at that time. Patient will follow-up with Dr. Silverio Decamp or myself as needed  Alfredia Ferguson PA-C 06/01/2021   Cc: Shon Baton, MD

## 2021-06-01 NOTE — Patient Instructions (Addendum)
If you are age 85 or older, your body mass index should be between 23-30. Your Body mass index is 23.19 kg/m. If this is out of the aforementioned range listed, please consider follow up with your Primary Care Provider. __________________________________________________________  The [facility] providers would like to encourage you to use [facility] to communicate with providers for non-urgent requests or questions.  Due to long hold times on the telephone, sending your provider a message by [facility] may be a faster and more efficient way to get a response.  Please allow 48 business hours for a response.  Please remember that this is for non-urgent requests.   Refills have been sent to your pharmacy on your Dicyclomine and Cholestyramine.  Avoid artificial sweeteners, you can try [place] Use minimal lactose  Call the the office if your symptoms worsen. We will try a round of Xifaxan at that time.  Follow up in 1 year or sooner if needed.  Thank you for entrusting me with your care and choosing [facility].  [name], PA-C

## 2021-06-01 NOTE — Progress Notes (Signed)
EPIC Encounter for ICM Monitoring  Patient Name: Grace Robinson is a 87 y.o. female Date: 06/01/2021 Primary Care Physican: Shon Baton, MD Primary Cardiologist: Truitt Merle, NP Electrophysiologist: Vergie Living Pacing:  97.5%     06/01/2021 Office Weight: 141 lbs                VT-NS (>4 beats, >130 bpm)   1  Time in AT/AF  0.0 hr/day (0.0%)          Attempted call to patient and unable to reach.  Left detailed message per DPR regarding transmission. Transmission reviewed.    Optivol Thoracic impedance trending slightly below baseline.      Prescribed:  Furosemide 20 mg take 1 tablet (20 mg total) by mouth daily alternating (40 mg) every other day.   Potassium 10 mEq 1 tablet twice a day.   Labs: 11/10/2020 Creatinine 1.08, BUN 14, Potassium 4.0, Sodium 143, GFR 45-51   Recommendations:  Left voice mail with ICM number and encouraged to call if experiencing any fluid symptoms.   Follow-up plan: ICM clinic phone appointment on 06/08/2021 to recheck fluid levels.   91 day device clinic remote transmission 08/27/2021.      EP/Cardiology Office Visits:  08/03/2021 with Dr Caryl Comes.   Copy of ICM check sent to Dr. Caryl Comes.   3 month ICM trend: 06/01/2021.    1 Year ICM trend:       [name], RN 06/01/2021 3:07 PM

## 2021-06-02 ENCOUNTER — Telehealth: Payer: Self-pay

## 2021-06-02 NOTE — Telephone Encounter (Signed)
Remote ICM transmission received.  Attempted call to patient regarding ICM remote transmission and left detailed message per DPR.  Advised to return call for any fluid symptoms or questions. Next ICM remote transmission scheduled 07/07/2021.

## 2021-06-08 ENCOUNTER — Ambulatory Visit (INDEPENDENT_AMBULATORY_CARE_PROVIDER_SITE_OTHER): Payer: Medicare Other

## 2021-06-08 DIAGNOSIS — I5022 Chronic systolic (congestive) heart failure: Secondary | ICD-10-CM

## 2021-06-08 DIAGNOSIS — Z95810 Presence of automatic (implantable) cardiac defibrillator: Secondary | ICD-10-CM

## 2021-06-09 NOTE — Progress Notes (Signed)
EPIC Encounter for ICM Monitoring  Patient Name: Grace Robinson is a 87 y.o. female Date: 06/09/2021 Primary Care Physican: Shon Baton, MD Primary Cardiologist: Truitt Merle, NP Electrophysiologist: Vergie Living Pacing:  96.2%     06/01/2021 Weight: 141 lbs                Time in AT/AF  0.0 hr/day (0.0%)           Spoke with patient and heart failure questions reviewed.  Pt asymptomatic for fluid accumulation and feeling well.   Optivol Thoracic impedance suggesting fluid levels returned to normal.      Prescribed:  Furosemide 20 mg take 1 tablet (20 mg total) by mouth daily alternating (40 mg) every other day.   Potassium 10 mEq 1 tablet twice a day.   Labs: 11/10/2020 Creatinine 1.08, BUN 14, Potassium 4.0, Sodium 143, GFR 45-51   Recommendations:  No changes and encouraged to call if experiencing any fluid symptoms.   Follow-up plan: ICM clinic phone appointment on 07/07/2021.   91 day device clinic remote transmission 08/27/2021.      EP/Cardiology Office Visits:  08/03/2021 with Dr Caryl Comes.   Copy of ICM check sent to Dr. Caryl Comes.   3 month ICM trend: 06/08/2021.    1 Year ICM trend:       [name], RN 06/09/2021 10:52 AM

## 2021-06-10 ENCOUNTER — Telehealth: Payer: Self-pay

## 2021-06-10 NOTE — Telephone Encounter (Signed)
Prior Authorization has been started for Dicyclomine

## 2021-06-10 NOTE — Telephone Encounter (Signed)
Approved today Effective from 06/10/2021 through 06/10/2022. Drug Dicyclomine HCl '10MG'$  capsules Form [employer] Part D Electronic Request Form (CB)

## 2021-06-23 NOTE — Progress Notes (Signed)
Remote ICD transmission.   

## 2021-06-29 NOTE — Progress Notes (Signed)
Reviewed and agree with documentation and assessment and plan. K. Veena Sanchez Hemmer , MD   

## 2021-07-13 ENCOUNTER — Ambulatory Visit (INDEPENDENT_AMBULATORY_CARE_PROVIDER_SITE_OTHER): Payer: Medicare Other

## 2021-07-13 DIAGNOSIS — Z95810 Presence of automatic (implantable) cardiac defibrillator: Secondary | ICD-10-CM

## 2021-07-13 DIAGNOSIS — I5022 Chronic systolic (congestive) heart failure: Secondary | ICD-10-CM | POA: Diagnosis not present

## 2021-07-14 NOTE — Progress Notes (Signed)
EPIC Encounter for ICM Monitoring  Patient Name: Grace Robinson is a 87 y.o. female Date: 07/14/2021 Primary Care Physican: Shon Baton, MD Primary Cardiologist: Caryl Comes Electrophysiologist: Vergie Living Pacing:  97.2%     06/01/2021 Office Weight: 141 lbs  Time in AT/AF  <0.1 hr/day (<0.1%)           Spoke with patient and heart failure questions reviewed.  Pt asymptomatic for fluid accumulation and feeling well.   Optivol Thoracic impedance suggesting normal fluid levels.      Prescribed:  Furosemide 20 mg take 1 tablet (20 mg total) by mouth daily alternating (40 mg) every other day.   Potassium 10 mEq 1 tablet twice a day.   Labs: 11/10/2020 Creatinine 1.08, BUN 14, Potassium 4.0, Sodium 143, GFR 45-51   Recommendations:  No changes and encouraged to call if experiencing any fluid symptoms.   Follow-up plan: ICM clinic phone appointment on 08/28/2021.   91 day device clinic remote transmission 08/27/2021.      EP/Cardiology Office Visits:  08/03/2021 with Dr Caryl Comes.   Copy of ICM check sent to Dr. Caryl Comes.    3 month ICM trend: 07/13/2021.    1 Year ICM trend:       [name], RN 07/14/2021 10:39 AM

## 2021-07-30 DIAGNOSIS — I428 Other cardiomyopathies: Secondary | ICD-10-CM | POA: Insufficient documentation

## 2021-08-03 ENCOUNTER — Other Ambulatory Visit: Payer: Self-pay

## 2021-08-03 ENCOUNTER — Encounter: Payer: Self-pay | Admitting: Internal Medicine

## 2021-08-03 ENCOUNTER — Ambulatory Visit: Payer: Medicare Other | Admitting: Internal Medicine

## 2021-08-03 VITALS — BP 152/80 | HR 82 | Ht 65.5 in | Wt 141.2 lb

## 2021-08-03 DIAGNOSIS — I428 Other cardiomyopathies: Secondary | ICD-10-CM

## 2021-08-03 DIAGNOSIS — I472 Ventricular tachycardia: Secondary | ICD-10-CM

## 2021-08-03 DIAGNOSIS — Z95810 Presence of automatic (implantable) cardiac defibrillator: Secondary | ICD-10-CM

## 2021-08-03 DIAGNOSIS — I4729 Other ventricular tachycardia: Secondary | ICD-10-CM | POA: Diagnosis not present

## 2021-08-03 NOTE — Patient Instructions (Signed)
Medication Instructions:  Your physician has recommended you make the following change in your medication:   Increase Furosemide to 40mg  (2 tablets) by mouth x 5 days then return to your regular dosing schedule. *If you need a refill on your cardiac medications before your next appointment, please call your pharmacy*   Lab Work: None ordered.  If you have labs (blood work) drawn today and your tests are completely normal, you will receive your results only by: [place] (if you have MyChart) OR A paper copy in the mail If you have any lab test that is abnormal or we need to change your treatment, we will call you to review the results.   Testing/Procedures: None ordered.    Follow-Up: At [facility], you and your health needs are our priority.  As part of our continuing mission to provide you with exceptional heart care, we have created designated Provider Care Teams.  These Care Teams include your primary Cardiologist (physician) and Advanced Practice Providers (APPs -  Physician Assistants and Nurse Practitioners) who all work together to provide you with the care you need, when you need it.  We recommend signing up for the patient portal called "MyChart".  Sign up information is provided on this After Visit Summary.  MyChart is used to connect with patients for Virtual Visits (Telemedicine).  Patients are able to view lab/test results, encounter notes, upcoming appointments, etc.  Non-urgent messages can be sent to your provider as well.   To learn more about what you can do with MyChart, go to [email or web address].    Your next appointment:   6 month(s)  The format for your next appointment:   In Person  Provider:   Virl Axe, MD

## 2021-08-03 NOTE — Progress Notes (Signed)
Patient Care Team: Shon Baton, MD as PCP - General (Internal Medicine)   HPI  Grace Robinson is a 87 y.o. female seen in follow-up for CRT-D implanted remotely with subsequent normalization of LV function and GEN change in 2014   History of ventricular tachycardia in the past with ATP therapy    Since last being seen in our clinic for resolved NICM and VT s/p CRT-D with interval ATP having recently moved into [facility] and isolated, the patient reports things have been better and getting adjusted    Overall, she appears well.  When she walks she generally feels fine with no shortness of breath.  Typically she has LE edema, with severity mostly dependent on her salt intake. She alternates taking 1 tablet or 2 tablets of furosemide daily. When taking 2 tablets she does urinate more frequently.   The patient denies chest pain, nocturnal dyspnea, orthopnea.  There have been no palpitations, lightheadedness or syncope.     Date Cr K Hgb  10/19 0.83 3.8 13.2  1/21 0.9 3.8 15  1/22 1.08 4.0 14.6   DATE TEST EF   6/9 Echo  55-65 %   9/15 Echo  55-65 %          Records and Results Reviewed  Past Medical History:  Diagnosis Date   Biventricular ICD (implantable cardiac defibrillator) in place    Cellulitis    Change in bowel habits    CHF (congestive heart failure) ([place])    Colon polyps    Diabetes mellitus    Type II   Diarrhea    Dyslipidemia    HOH (hard of hearing)    left   HTN (hypertension)    Ischemic cardiomyopathy    LBBB (left bundle branch block)    chronic   Meniere's disease    T wave oversensing 01/17/2014   Ulcerative colitis    Varicose veins    4 lazer  treatment each leg    Past Surgical History:  Procedure Laterality Date   ABDOMINAL HYSTERECTOMY     CARDIAC CATHETERIZATION  08/2011   CHOLECYSTECTOMY     COLONOSCOPY     EP Study  08/22/08   IMPLANTABLE CARDIOVERTER DEFIBRILLATOR GENERATOR CHANGE N/A 08/22/2013   Procedure:  IMPLANTABLE CARDIOVERTER DEFIBRILLATOR GENERATOR CHANGE;  Surgeon: Deboraha Sprang, MD;  Location: [facility] CATH LAB;  Service: Cardiovascular;  Laterality: N/A;   KNEE ARTHROSCOPY Left 04/14/2016   Procedure: LEFT KNEE ARTHROSCOPY, PARTIAL MEDIAL AND PARTIAL LATERAL MENISCECTOMY, MEDIAL PLICA;  Surgeon: Dorna Leitz, MD;  Location: [facility];  Service: Orthopedics;  Laterality: Left;   mastoid sugery     in the setting of Meniere's disease   TONSILLECTOMY AND ADENOIDECTOMY      Current Meds  Medication Sig   acetaminophen (TYLENOL) 500 MG tablet Take 500 mg by mouth as needed for mild pain.   aspirin EC 81 MG tablet Take 81 mg by mouth daily. Swallow whole.   atorvastatin (LIPITOR) 40 MG tablet Take 40 mg by mouth daily.   Biotin 5000 MCG CAPS Take 1 capsule by mouth daily.   Cholecalciferol (VITAMIN D-3) 25 MCG (1000 UT) CAPS Take 2 capsules by mouth daily.    cholestyramine (QUESTRAN) 4 g packet Take 1 packet (4 g total) by mouth daily.   dicyclomine (BENTYL) 10 MG capsule Take 1 capsule (10 mg total) by mouth 3 (three) times daily before meals. Take 1 tab 30 min before meals  furosemide (LASIX) 20 MG tablet Take 1 tablet (20 mg total) by mouth daily. alternating (40 mg) every other day.   HUMALOG MIX 75/25 (75-25) 100 UNIT/ML SUSP injection Inject 32 Units into the skin 2 (two) times daily with a meal.    Lancets (ONETOUCH DELICA PLUS [id number]) MISC CHECK BLOOD SUGAR TWICE DAILY   loperamide (IMODIUM A-D) 2 MG tablet Take 2 mg by mouth as needed for diarrhea or loose stools.   metoprolol succinate (TOPROL-XL) 100 MG 24 hr tablet Take 1 tablet by mouth 2 (two) times daily.   olmesartan (BENICAR) 40 MG tablet Take 40 mg by mouth daily.   ONETOUCH VERIO test strip 2 (two) times daily.   potassium chloride (K-DUR) 10 MEQ tablet Take 1 tablet by mouth 2 (two) times daily.   primidone (MYSOLINE) 50 MG tablet TAKE 1 TABLET(50 MG) BY MOUTH AT BEDTIME   vitamin B-12 (CYANOCOBALAMIN) 1000 MCG tablet Take  1,000 mcg by mouth daily.   vitamin E 400 UNIT capsule Take 400 Units by mouth daily.    Allergies  Allergen Reactions   Carvedilol Other (See Comments)   Vancomycin Other (See Comments)   Sudafed [Pseudoephedrine] Other (See Comments)    Makes skin feel like its crawling      Review of Systems negative except from HPI and PMH  Physical Exam BP (!) 152/80   Pulse 82   Ht 5' 5.5" (1.664 m)   Wt 141 lb 3.2 oz (64 kg)   SpO2 98%   BMI 23.14 kg/m  Well developed and well nourished in no acute distress HENT normal Neck supple with JVP-flat Clear Device pocket well healed; without hematoma or erythema.  There is no tethering  Regular rate and rhythm, no gallop No  murmur Abd-soft with active BS No Clubbing cyanosis, 2+ edema Skin-warm and dry A & Oriented  Grossly normal sensory and motor function  ECG AV pacing at 82 with a negative QRS lead I and a negative QRS lead V1 and a QRS duration of 144 ms  CrCl cannot be calculated (Patient's most recent lab result is older than the maximum 21 days allowed.).   Other studies personally reviewed: Additional studies/ records that were reviewed today include: As above        Assessment and  Plan  Nonischemic cardiomyopathy -resolved   Congestive heart failure  HFpEF   CRT-D-Medtronic   Hypertension    Ventricular tachycardia  2019/20 Rx with ATP   End-of-life discussion, CODE STATUS/DNR    Recurrent paced terminated ventricular tachycardia.  Slow.  Device approaching ERI.  Discussion regarding end-of-life care including trying to clarify the implications of DNR and the more specific issues related to intubation for pneumonia with the hopes of recovery, shocks from her ICD etc.  She has expressed thoughts on either side of both of those issues and I have encouraged her to talk with her son.  She will also be seeing Dr. Virgina Jock later this week and perhaps he can have further conversations.  Normally prior to device  generator replacement which would include at least low voltage therapy to decide what to do about high-voltage therapies.  She is volume overloaded.  We will increase her furosemide from 80/40--40 daily for 5 days.    Current medicines are reviewed at length with the patient today .  The patient does not have concerns regarding medicines.  I,Mathew Stumpf,acting as a scribe for Virl Axe, MD.,have documented all relevant documentation on the behalf of Virl Axe,  MD,as directed by  Virl Axe, MD while in the presence of Virl Axe, MD.  I, Virl Axe, MD, have reviewed all documentation for this visit. The documentation on 08/03/21 for the exam, diagnosis, procedures, and orders are all accurate and complete.

## 2021-08-06 DIAGNOSIS — E1122 Type 2 diabetes mellitus with diabetic chronic kidney disease: Secondary | ICD-10-CM | POA: Diagnosis not present

## 2021-08-06 DIAGNOSIS — E1140 Type 2 diabetes mellitus with diabetic neuropathy, unspecified: Secondary | ICD-10-CM | POA: Diagnosis not present

## 2021-08-06 DIAGNOSIS — Z794 Long term (current) use of insulin: Secondary | ICD-10-CM | POA: Diagnosis not present

## 2021-08-06 DIAGNOSIS — I2510 Atherosclerotic heart disease of native coronary artery without angina pectoris: Secondary | ICD-10-CM | POA: Diagnosis not present

## 2021-08-16 ENCOUNTER — Other Ambulatory Visit: Payer: Self-pay | Admitting: Physician Assistant

## 2021-08-27 ENCOUNTER — Ambulatory Visit (INDEPENDENT_AMBULATORY_CARE_PROVIDER_SITE_OTHER): Payer: Medicare Other

## 2021-08-27 ENCOUNTER — Telehealth: Payer: Self-pay

## 2021-08-27 DIAGNOSIS — I428 Other cardiomyopathies: Secondary | ICD-10-CM

## 2021-08-27 LAB — CUP PACEART REMOTE DEVICE CHECK
Battery Remaining Longevity: 8 mo
Battery Voltage: 2.84 V
Brady Statistic AP VP Percent: 46.04 %
Brady Statistic AP VS Percent: 0.68 %
Brady Statistic AS VP Percent: 52.42 %
Brady Statistic AS VS Percent: 0.86 %
Brady Statistic RA Percent Paced: 45.99 %
Brady Statistic RV Percent Paced: 0.29 %
Date Time Interrogation Session: 20221027033524
HighPow Impedance: 49 Ohm
HighPow Impedance: 65 Ohm
Implantable Lead Implant Date: 20031007
Implantable Lead Implant Date: 20031007
Implantable Lead Implant Date: 20031007
Implantable Lead Implant Date: 20060227
Implantable Lead Location: 753858
Implantable Lead Location: 753859
Implantable Lead Location: 753860
Implantable Lead Location: 753860
Implantable Lead Model: 4193
Implantable Lead Model: 4469
Implantable Lead Model: 4470
Implantable Lead Model: 6949
Implantable Lead Serial Number: 337988
Implantable Lead Serial Number: 361236
Implantable Pulse Generator Implant Date: 20141022
Lead Channel Impedance Value: 342 Ohm
Lead Channel Impedance Value: 4047 Ohm
Lead Channel Impedance Value: 4047 Ohm
Lead Channel Impedance Value: 418 Ohm
Lead Channel Impedance Value: 456 Ohm
Lead Channel Impedance Value: 532 Ohm
Lead Channel Pacing Threshold Amplitude: 0.625 V
Lead Channel Pacing Threshold Amplitude: 1 V
Lead Channel Pacing Threshold Amplitude: 1.25 V
Lead Channel Pacing Threshold Pulse Width: 0.4 ms
Lead Channel Pacing Threshold Pulse Width: 0.4 ms
Lead Channel Pacing Threshold Pulse Width: 0.4 ms
Lead Channel Sensing Intrinsic Amplitude: 1 mV
Lead Channel Sensing Intrinsic Amplitude: 1 mV
Lead Channel Sensing Intrinsic Amplitude: 30.25 mV
Lead Channel Sensing Intrinsic Amplitude: 30.25 mV
Lead Channel Setting Pacing Amplitude: 2 V
Lead Channel Setting Pacing Amplitude: 2 V
Lead Channel Setting Pacing Amplitude: 3.25 V
Lead Channel Setting Pacing Pulse Width: 0.4 ms
Lead Channel Setting Pacing Pulse Width: 0.4 ms
Lead Channel Setting Sensing Sensitivity: 0.6 mV

## 2021-08-27 NOTE — Telephone Encounter (Signed)
Scheduled remote reviewed. Normal device function.   The device estimates 8 months until ERI, should start IFU, sent to triage There were seven NSVT arrhythmias detected Next remote 11/26/2021. Kathy Breach, RN, CCDS, [employer]   Successful telephone encounter to patient to discuss need for monthly battery checks. Patient is scheduled to begin monthly checks 09/28/21. Patient appreciative of call. Will continue to monitor. PEP

## 2021-08-28 ENCOUNTER — Ambulatory Visit (INDEPENDENT_AMBULATORY_CARE_PROVIDER_SITE_OTHER): Payer: Medicare Other

## 2021-08-28 DIAGNOSIS — I5022 Chronic systolic (congestive) heart failure: Secondary | ICD-10-CM

## 2021-08-28 DIAGNOSIS — Z95810 Presence of automatic (implantable) cardiac defibrillator: Secondary | ICD-10-CM | POA: Diagnosis not present

## 2021-08-28 NOTE — Progress Notes (Signed)
EPIC Encounter for ICM Monitoring  Patient Name: Grace Robinson is a 87 y.o. female Date: 08/28/2021 Primary Care Physican: Shon Baton, MD Primary Cardiologist: Caryl Comes Electrophysiologist: Vergie Living Pacing:  96.8%     08/03/2021 Office Weight: 141 lbs   Time in AT/AF  <0.1 hr/day (<0.1%) VT-NS (>4 beats, >130 bpm) 7 Battery Longevity: 8 months           Spoke with patient and heart failure questions reviewed.  Pt reports at 10/3 OV with Dr Caryl Comes, he ordered to increase Furosemide to 40mg  (2 tablets) by mouth x 5 days.  She reports when she takes 40 mg daily she does not have any swelling of the feet but does have it when she takes alternating dose.   Discussed if feet swelling continues to be persistent, will discuss with Dr Caryl Comes at next [facility] remote transmission.     Optivol Thoracic impedance suggesting normal fluid levels.      Prescribed:  Furosemide 20 mg take 1 tablet (20 mg total) by mouth daily alternating (40 mg) every other day.   Potassium 10 mEq 1 tablet twice a day.   Labs: 11/10/2020 Creatinine 1.08, BUN 14, Potassium 4.0, Sodium 143, GFR 45-51   Recommendations: Encouraged to call if experiencing any fluid symptoms.   Follow-up plan: ICM clinic phone appointment on 09/28/2021.   91 day device clinic remote transmission 11/30/2021.      EP/Cardiology Office Visits:  Recall 01/30/2022 with Dr Caryl Comes.   Copy of ICM check sent to Dr. Caryl Comes.     3 month ICM trend: 08/27/2021.    1 Year ICM trend:       [name], RN 08/28/2021 9:57 AM

## 2021-09-04 NOTE — Progress Notes (Signed)
Remote ICD transmission.   

## 2021-09-28 ENCOUNTER — Ambulatory Visit (INDEPENDENT_AMBULATORY_CARE_PROVIDER_SITE_OTHER): Payer: Medicare Other

## 2021-09-28 DIAGNOSIS — I428 Other cardiomyopathies: Secondary | ICD-10-CM

## 2021-09-28 DIAGNOSIS — Z95810 Presence of automatic (implantable) cardiac defibrillator: Secondary | ICD-10-CM

## 2021-09-28 DIAGNOSIS — I5022 Chronic systolic (congestive) heart failure: Secondary | ICD-10-CM

## 2021-09-29 LAB — CUP PACEART REMOTE DEVICE CHECK
Battery Remaining Longevity: 7 mo
Battery Voltage: 2.84 V
Brady Statistic AP VP Percent: 41.13 %
Brady Statistic AP VS Percent: 0.55 %
Brady Statistic AS VP Percent: 57.37 %
Brady Statistic AS VS Percent: 0.95 %
Brady Statistic RA Percent Paced: 40.22 %
Brady Statistic RV Percent Paced: 0.24 %
Date Time Interrogation Session: 20221128012304
HighPow Impedance: 50 Ohm
HighPow Impedance: 69 Ohm
Implantable Lead Implant Date: 20031007
Implantable Lead Implant Date: 20031007
Implantable Lead Implant Date: 20031007
Implantable Lead Implant Date: 20060227
Implantable Lead Location: 753858
Implantable Lead Location: 753859
Implantable Lead Location: 753860
Implantable Lead Location: 753860
Implantable Lead Model: 4193
Implantable Lead Model: 4469
Implantable Lead Model: 4470
Implantable Lead Model: 6949
Implantable Lead Serial Number: 337988
Implantable Lead Serial Number: 361236
Implantable Pulse Generator Implant Date: 20141022
Lead Channel Impedance Value: 361 Ohm
Lead Channel Impedance Value: 4047 Ohm
Lead Channel Impedance Value: 4047 Ohm
Lead Channel Impedance Value: 456 Ohm
Lead Channel Impedance Value: 475 Ohm
Lead Channel Impedance Value: 532 Ohm
Lead Channel Pacing Threshold Amplitude: 0.75 V
Lead Channel Pacing Threshold Amplitude: 1 V
Lead Channel Pacing Threshold Amplitude: 1.375 V
Lead Channel Pacing Threshold Pulse Width: 0.4 ms
Lead Channel Pacing Threshold Pulse Width: 0.4 ms
Lead Channel Pacing Threshold Pulse Width: 0.4 ms
Lead Channel Sensing Intrinsic Amplitude: 1.25 mV
Lead Channel Sensing Intrinsic Amplitude: 1.25 mV
Lead Channel Sensing Intrinsic Amplitude: 29.875 mV
Lead Channel Sensing Intrinsic Amplitude: 29.875 mV
Lead Channel Setting Pacing Amplitude: 2 V
Lead Channel Setting Pacing Amplitude: 2 V
Lead Channel Setting Pacing Amplitude: 3 V
Lead Channel Setting Pacing Pulse Width: 0.4 ms
Lead Channel Setting Pacing Pulse Width: 0.4 ms
Lead Channel Setting Sensing Sensitivity: 0.6 mV

## 2021-09-30 NOTE — Progress Notes (Signed)
EPIC Encounter for ICM Monitoring  Patient Name: Grace Robinson is a 87 y.o. female Date: 09/30/2021 Primary Care Physican: Shon Baton, MD Primary Cardiologist: Caryl Comes Electrophysiologist: Vergie Living Pacing:  94.3%     08/03/2021 Office Weight: 141 lbs   Time in AT/AF  <0.1 hr/day (<0.1%) VT-NS (>4 beats, >130 bpm)  6  Battery Longevity: 7 months           Spoke with patient and heart failure questions reviewed.  Patient is feeling well.  She is excited to be picking up her granddaughter from [place] today for a 5 day visit.     Optivol Thoracic impedance suggesting normal fluid levels.      Prescribed:  Furosemide 20 mg take 1 tablet (20 mg total) by mouth daily alternating (40 mg) every other day.   Potassium 10 mEq 1 tablet twice a day.   Labs: 11/10/2020 Creatinine 1.08, BUN 14, Potassium 4.0, Sodium 143, GFR 45-51   Recommendations: Encouraged to call if experiencing any fluid symptoms.   Follow-up plan: ICM clinic phone appointment on 13/2023.   91 day device clinic remote transmission 11/30/2021.      EP/Cardiology Office Visits:  Recall 01/30/2022 with Dr Caryl Comes.   Copy of ICM check sent to Dr. Caryl Comes.     3 month ICM trend: 09/28/2021.    12-14 Month ICM trend:       [name], RN 09/30/2021 10:46 AM

## 2021-10-06 NOTE — Progress Notes (Signed)
Remote ICD transmission.   

## 2021-10-29 ENCOUNTER — Ambulatory Visit (INDEPENDENT_AMBULATORY_CARE_PROVIDER_SITE_OTHER): Payer: Medicare Other

## 2021-10-29 DIAGNOSIS — I428 Other cardiomyopathies: Secondary | ICD-10-CM

## 2021-10-29 LAB — CUP PACEART REMOTE DEVICE CHECK
Battery Remaining Longevity: 7 mo
Battery Voltage: 2.81 V
Brady Statistic AP VP Percent: 47.86 %
Brady Statistic AP VS Percent: 0.72 %
Brady Statistic AS VP Percent: 50.56 %
Brady Statistic AS VS Percent: 0.86 %
Brady Statistic RA Percent Paced: 47.56 %
Brady Statistic RV Percent Paced: 0.25 %
Date Time Interrogation Session: 20221229012303
HighPow Impedance: 52 Ohm
HighPow Impedance: 71 Ohm
Implantable Lead Implant Date: 20031007
Implantable Lead Implant Date: 20031007
Implantable Lead Implant Date: 20031007
Implantable Lead Implant Date: 20060227
Implantable Lead Location: 753858
Implantable Lead Location: 753859
Implantable Lead Location: 753860
Implantable Lead Location: 753860
Implantable Lead Model: 4193
Implantable Lead Model: 4469
Implantable Lead Model: 4470
Implantable Lead Model: 6949
Implantable Lead Serial Number: 337988
Implantable Lead Serial Number: 361236
Implantable Pulse Generator Implant Date: 20141022
Lead Channel Impedance Value: 342 Ohm
Lead Channel Impedance Value: 4047 Ohm
Lead Channel Impedance Value: 4047 Ohm
Lead Channel Impedance Value: 456 Ohm
Lead Channel Impedance Value: 475 Ohm
Lead Channel Impedance Value: 551 Ohm
Lead Channel Pacing Threshold Amplitude: 0.75 V
Lead Channel Pacing Threshold Amplitude: 1 V
Lead Channel Pacing Threshold Amplitude: 1.25 V
Lead Channel Pacing Threshold Pulse Width: 0.4 ms
Lead Channel Pacing Threshold Pulse Width: 0.4 ms
Lead Channel Pacing Threshold Pulse Width: 0.4 ms
Lead Channel Sensing Intrinsic Amplitude: 1.25 mV
Lead Channel Sensing Intrinsic Amplitude: 1.25 mV
Lead Channel Sensing Intrinsic Amplitude: 30.875 mV
Lead Channel Sensing Intrinsic Amplitude: 30.875 mV
Lead Channel Setting Pacing Amplitude: 2 V
Lead Channel Setting Pacing Amplitude: 2 V
Lead Channel Setting Pacing Amplitude: 2.5 V
Lead Channel Setting Pacing Pulse Width: 0.4 ms
Lead Channel Setting Pacing Pulse Width: 0.4 ms
Lead Channel Setting Sensing Sensitivity: 0.6 mV

## 2021-11-10 NOTE — Progress Notes (Signed)
Remote ICD transmission.   

## 2021-11-11 NOTE — Progress Notes (Signed)
No ICM remote transmission received for 11/03/2021 and next ICM transmission scheduled for 11/23/2021.

## 2021-11-13 ENCOUNTER — Encounter (HOSPITAL_BASED_OUTPATIENT_CLINIC_OR_DEPARTMENT_OTHER): Payer: Self-pay | Admitting: Emergency Medicine

## 2021-11-13 ENCOUNTER — Emergency Department (HOSPITAL_BASED_OUTPATIENT_CLINIC_OR_DEPARTMENT_OTHER): Payer: Medicare Other | Admitting: Radiology

## 2021-11-13 ENCOUNTER — Emergency Department (HOSPITAL_BASED_OUTPATIENT_CLINIC_OR_DEPARTMENT_OTHER)
Admission: EM | Admit: 2021-11-13 | Discharge: 2021-11-13 | Disposition: A | Discharge status: Home / Self Care | Payer: Medicare Other | Attending: Emergency Medicine | Admitting: Emergency Medicine

## 2021-11-13 DIAGNOSIS — Z794 Long term (current) use of insulin: Secondary | ICD-10-CM | POA: Diagnosis not present

## 2021-11-13 DIAGNOSIS — S2241XA Multiple fractures of ribs, right side, initial encounter for closed fracture: Secondary | ICD-10-CM | POA: Diagnosis not present

## 2021-11-13 DIAGNOSIS — S299XXA Unspecified injury of thorax, initial encounter: Secondary | ICD-10-CM | POA: Diagnosis not present

## 2021-11-13 DIAGNOSIS — R079 Chest pain, unspecified: Secondary | ICD-10-CM | POA: Diagnosis not present

## 2021-11-13 DIAGNOSIS — W1839XA Other fall on same level, initial encounter: Secondary | ICD-10-CM | POA: Diagnosis not present

## 2021-11-13 DIAGNOSIS — Z79899 Other long term (current) drug therapy: Secondary | ICD-10-CM | POA: Diagnosis not present

## 2021-11-13 DIAGNOSIS — Z7982 Long term (current) use of aspirin: Secondary | ICD-10-CM | POA: Diagnosis not present

## 2021-11-13 DIAGNOSIS — R0781 Pleurodynia: Secondary | ICD-10-CM | POA: Diagnosis not present

## 2021-11-13 MED ORDER — TRAMADOL HCL 50 MG PO TABS: 25.0000 mg | ORAL_TABLET | Freq: Four times a day (QID) | ORAL | 0 refills | Status: DC | PRN

## 2021-11-13 MED ORDER — TRAMADOL HCL 50 MG PO TABS
25.0000 mg | ORAL_TABLET | Freq: Once | ORAL | Status: AC
Start: 2021-11-13 — End: 2021-11-13
  Administered 2021-11-13: 25 mg via ORAL
  Filled 2021-11-13: qty 1
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
## 2021-11-13 NOTE — Discharge Instructions (Signed)
Call your primary care doctor or specialist as discussed in the next 2-3 days.   Return immediately back to the ER if:  Your symptoms worsen within the next 12-24 hours. You develop new symptoms such as new fevers, persistent vomiting, new pain, shortness of breath, or new weakness or numbness, or if you have any other concerns.  

## 2021-11-23 ENCOUNTER — Telehealth: Payer: Self-pay

## 2021-11-23 NOTE — Telephone Encounter (Signed)
Spoke with patient and advised monitor is disconnected. Advised have not received any reports.  Attempted to assist in sending remote transmission and received error code.  Advised to call [employer] Monitor Support Specialist at [phone].  ICM remote transmission rescheduled to 12/07/2021.

## 2021-11-24 ENCOUNTER — Ambulatory Visit (INDEPENDENT_AMBULATORY_CARE_PROVIDER_SITE_OTHER): Payer: Medicare Other

## 2021-11-24 DIAGNOSIS — I5022 Chronic systolic (congestive) heart failure: Secondary | ICD-10-CM | POA: Diagnosis not present

## 2021-11-24 DIAGNOSIS — Z95810 Presence of automatic (implantable) cardiac defibrillator: Secondary | ICD-10-CM

## 2021-11-24 NOTE — Progress Notes (Signed)
EPIC Encounter for ICM Monitoring  Patient Name: REED EIFERT is a 88 y.o. female Date: 11/24/2021 Primary Care Physican: Shon Baton, MD Primary Cardiologist: Caryl Comes Electrophysiologist: Vergie Living Pacing:  94.3%     08/03/2021 Office Weight: 141 lbs   Time in AT/AF  <0.1 hr/day (<0.1%) VT-NS (>4 beats, >130 bpm)  1   Battery Longevity: 5 months           Spoke with patient on 11/24/2021 to assist with sending remote transmission.  Transmission received today after pt called tech support for assistance.   Pt is recovering from a fall resulting in 3 broken ribs.  She is very sore but doing okay.  No complaints of fluid symptoms.   Optivol Thoracic impedance suggesting normal fluid levels.      Prescribed:  Furosemide 20 mg take 1 tablet (20 mg total) by mouth daily alternating (40 mg) every other day.   Potassium 10 mEq 1 tablet twice a day.   Labs: 11/10/2020 Creatinine 1.08, BUN 14, Potassium 4.0, Sodium 143, GFR 45-51   Recommendations: Encouraged to call if experiencing any fluid symptoms.   Follow-up plan: ICM clinic phone appointment on 12/28/2021.   91 day device clinic remote transmission 11/30/2021.      EP/Cardiology Office Visits:  Recall 01/30/2022 with Dr Caryl Comes.   Copy of ICM check sent to Dr. Caryl Comes.     3 month ICM trend: 11/24/2021.    12-14 Month ICM trend:     [name], RN 11/24/2021 9:43 AM

## 2021-11-30 ENCOUNTER — Ambulatory Visit (INDEPENDENT_AMBULATORY_CARE_PROVIDER_SITE_OTHER): Payer: Medicare Other

## 2021-11-30 DIAGNOSIS — I428 Other cardiomyopathies: Secondary | ICD-10-CM | POA: Diagnosis not present

## 2021-11-30 LAB — CUP PACEART REMOTE DEVICE CHECK
Battery Remaining Longevity: 6 mo
Battery Voltage: 2.82 V
Brady Statistic AP VP Percent: 33.28 %
Brady Statistic AP VS Percent: 0.6 %
Brady Statistic AS VP Percent: 65.12 %
Brady Statistic AS VS Percent: 1 %
Brady Statistic RA Percent Paced: 33.66 %
Brady Statistic RV Percent Paced: 0.16 %
Date Time Interrogation Session: 20230130001606
HighPow Impedance: 51 Ohm
HighPow Impedance: 73 Ohm
Implantable Lead Implant Date: 20031007
Implantable Lead Implant Date: 20031007
Implantable Lead Implant Date: 20031007
Implantable Lead Implant Date: 20060227
Implantable Lead Location: 753858
Implantable Lead Location: 753859
Implantable Lead Location: 753860
Implantable Lead Location: 753860
Implantable Lead Model: 4193
Implantable Lead Model: 4469
Implantable Lead Model: 4470
Implantable Lead Model: 6949
Implantable Lead Serial Number: 337988
Implantable Lead Serial Number: 361236
Implantable Pulse Generator Implant Date: 20141022
Lead Channel Impedance Value: 361 Ohm
Lead Channel Impedance Value: 4047 Ohm
Lead Channel Impedance Value: 4047 Ohm
Lead Channel Impedance Value: 475 Ohm
Lead Channel Impedance Value: 475 Ohm
Lead Channel Impedance Value: 551 Ohm
Lead Channel Pacing Threshold Amplitude: 0.625 V
Lead Channel Pacing Threshold Amplitude: 0.875 V
Lead Channel Pacing Threshold Amplitude: 1.75 V
Lead Channel Pacing Threshold Pulse Width: 0.4 ms
Lead Channel Pacing Threshold Pulse Width: 0.4 ms
Lead Channel Pacing Threshold Pulse Width: 0.4 ms
Lead Channel Sensing Intrinsic Amplitude: 1.5 mV
Lead Channel Sensing Intrinsic Amplitude: 1.5 mV
Lead Channel Sensing Intrinsic Amplitude: 31.125 mV
Lead Channel Sensing Intrinsic Amplitude: 31.125 mV
Lead Channel Setting Pacing Amplitude: 2 V
Lead Channel Setting Pacing Amplitude: 2 V
Lead Channel Setting Pacing Amplitude: 3.5 V
Lead Channel Setting Pacing Pulse Width: 0.4 ms
Lead Channel Setting Pacing Pulse Width: 0.4 ms
Lead Channel Setting Sensing Sensitivity: 0.6 mV

## 2021-12-03 DIAGNOSIS — E785 Hyperlipidemia, unspecified: Secondary | ICD-10-CM | POA: Diagnosis not present

## 2021-12-03 DIAGNOSIS — E559 Vitamin D deficiency, unspecified: Secondary | ICD-10-CM | POA: Diagnosis not present

## 2021-12-03 DIAGNOSIS — E1140 Type 2 diabetes mellitus with diabetic neuropathy, unspecified: Secondary | ICD-10-CM | POA: Diagnosis not present

## 2021-12-08 NOTE — Progress Notes (Signed)
Remote ICD transmission.   

## 2021-12-10 DIAGNOSIS — Z794 Long term (current) use of insulin: Secondary | ICD-10-CM | POA: Diagnosis not present

## 2021-12-10 DIAGNOSIS — E1122 Type 2 diabetes mellitus with diabetic chronic kidney disease: Secondary | ICD-10-CM | POA: Diagnosis not present

## 2021-12-10 DIAGNOSIS — Z1212 Encounter for screening for malignant neoplasm of rectum: Secondary | ICD-10-CM | POA: Diagnosis not present

## 2021-12-10 DIAGNOSIS — E1140 Type 2 diabetes mellitus with diabetic neuropathy, unspecified: Secondary | ICD-10-CM | POA: Diagnosis not present

## 2021-12-10 DIAGNOSIS — Z0000 Encounter for general adult medical examination without abnormal findings: Secondary | ICD-10-CM | POA: Diagnosis not present

## 2021-12-10 DIAGNOSIS — R82998 Other abnormal findings in urine: Secondary | ICD-10-CM | POA: Diagnosis not present

## 2021-12-28 ENCOUNTER — Ambulatory Visit (INDEPENDENT_AMBULATORY_CARE_PROVIDER_SITE_OTHER): Payer: Medicare Other

## 2021-12-28 DIAGNOSIS — I5022 Chronic systolic (congestive) heart failure: Secondary | ICD-10-CM

## 2021-12-28 DIAGNOSIS — Z95810 Presence of automatic (implantable) cardiac defibrillator: Secondary | ICD-10-CM

## 2021-12-30 NOTE — Progress Notes (Signed)
EPIC Encounter for ICM Monitoring  Patient Name: Grace Robinson is a 88 y.o. female Date: 12/30/2021 Primary Care Physican: Shon Baton, MD Primary Cardiologist: Caryl Comes Electrophysiologist: Vergie Living Pacing:  96%     08/03/2021 Office Weight: 141 lbs   Time in AT/AF  <0.1 hr/day (<0.1%) VT-NS (>4 beats, >130 bpm)  2   Battery Longevity: 5 months           Spoke with patient and heart failure questions reviewed.  Pt asymptomatic for fluid accumulation.  Reports feeling well at this time and voices no complaints.    Optivol Thoracic impedance suggesting normal fluid levels.      Prescribed:  Furosemide 20 mg take 1 tablet (20 mg total) by mouth daily alternating (40 mg) every other day.   Potassium 10 mEq 1 tablet twice a day.   Labs: 11/10/2020 Creatinine 1.08, BUN 14, Potassium 4.0, Sodium 143, GFR 45-51   Recommendations:   Encouraged to call if experiencing any fluid symptoms.   Follow-up plan: ICM clinic phone appointment on 02/01/2022.   91 day device clinic remote transmission 03/04/2022.      EP/Cardiology Office Visits:  Recall 01/30/2022 with Dr Caryl Comes.  Advised will have EP scheduler call her to schedule appointment with Dr Caryl Comes.     Copy of ICM check sent to Dr. Caryl Comes.      3 month ICM trend: 12/28/2021.    12-14 Month ICM trend:     [name], RN 12/30/2021 2:21 PM

## 2021-12-31 ENCOUNTER — Ambulatory Visit (INDEPENDENT_AMBULATORY_CARE_PROVIDER_SITE_OTHER): Payer: Medicare Other

## 2021-12-31 DIAGNOSIS — I4729 Other ventricular tachycardia: Secondary | ICD-10-CM

## 2021-12-31 LAB — CUP PACEART REMOTE DEVICE CHECK
Battery Remaining Longevity: 5 mo
Battery Voltage: 2.81 V
Brady Statistic AP VP Percent: 44.54 %
Brady Statistic AP VS Percent: 0.7 %
Brady Statistic AS VP Percent: 53.88 %
Brady Statistic AS VS Percent: 0.89 %
Brady Statistic RA Percent Paced: 44.23 %
Brady Statistic RV Percent Paced: 0.24 %
Date Time Interrogation Session: 20230302043726
HighPow Impedance: 52 Ohm
HighPow Impedance: 62 Ohm
Implantable Lead Implant Date: 20031007
Implantable Lead Implant Date: 20031007
Implantable Lead Implant Date: 20031007
Implantable Lead Implant Date: 20060227
Implantable Lead Location: 753858
Implantable Lead Location: 753859
Implantable Lead Location: 753860
Implantable Lead Location: 753860
Implantable Lead Model: 4193
Implantable Lead Model: 4469
Implantable Lead Model: 4470
Implantable Lead Model: 6949
Implantable Lead Serial Number: 337988
Implantable Lead Serial Number: 361236
Implantable Pulse Generator Implant Date: 20141022
Lead Channel Impedance Value: 342 Ohm
Lead Channel Impedance Value: 4047 Ohm
Lead Channel Impedance Value: 4047 Ohm
Lead Channel Impedance Value: 418 Ohm
Lead Channel Impedance Value: 456 Ohm
Lead Channel Impedance Value: 513 Ohm
Lead Channel Pacing Threshold Amplitude: 0.75 V
Lead Channel Pacing Threshold Amplitude: 1 V
Lead Channel Pacing Threshold Amplitude: 1.375 V
Lead Channel Pacing Threshold Pulse Width: 0.4 ms
Lead Channel Pacing Threshold Pulse Width: 0.4 ms
Lead Channel Pacing Threshold Pulse Width: 0.4 ms
Lead Channel Sensing Intrinsic Amplitude: 1.375 mV
Lead Channel Sensing Intrinsic Amplitude: 1.375 mV
Lead Channel Sensing Intrinsic Amplitude: 31.25 mV
Lead Channel Sensing Intrinsic Amplitude: 31.25 mV
Lead Channel Setting Pacing Amplitude: 2 V
Lead Channel Setting Pacing Amplitude: 2 V
Lead Channel Setting Pacing Amplitude: 3 V
Lead Channel Setting Pacing Pulse Width: 0.4 ms
Lead Channel Setting Pacing Pulse Width: 0.4 ms
Lead Channel Setting Sensing Sensitivity: 0.6 mV

## 2022-01-05 DIAGNOSIS — M1712 Unilateral primary osteoarthritis, left knee: Secondary | ICD-10-CM | POA: Diagnosis not present

## 2022-01-05 DIAGNOSIS — M25562 Pain in left knee: Secondary | ICD-10-CM | POA: Diagnosis not present

## 2022-01-05 DIAGNOSIS — M1711 Unilateral primary osteoarthritis, right knee: Secondary | ICD-10-CM | POA: Diagnosis not present

## 2022-01-05 DIAGNOSIS — M25561 Pain in right knee: Secondary | ICD-10-CM | POA: Diagnosis not present

## 2022-01-07 NOTE — Progress Notes (Signed)
Remote ICD transmission.   

## 2022-02-01 ENCOUNTER — Ambulatory Visit (INDEPENDENT_AMBULATORY_CARE_PROVIDER_SITE_OTHER): Payer: Medicare Other

## 2022-02-01 DIAGNOSIS — Z95810 Presence of automatic (implantable) cardiac defibrillator: Secondary | ICD-10-CM

## 2022-02-01 DIAGNOSIS — I5022 Chronic systolic (congestive) heart failure: Secondary | ICD-10-CM

## 2022-02-01 DIAGNOSIS — I428 Other cardiomyopathies: Secondary | ICD-10-CM

## 2022-02-01 LAB — CUP PACEART REMOTE DEVICE CHECK
Battery Remaining Longevity: 3 mo
Battery Voltage: 2.79 V
Brady Statistic AP VP Percent: 50.31 %
Brady Statistic AP VS Percent: 0.79 %
Brady Statistic AS VP Percent: 48.12 %
Brady Statistic AS VS Percent: 0.78 %
Brady Statistic RA Percent Paced: 49.64 %
Brady Statistic RV Percent Paced: 0.26 %
Date Time Interrogation Session: 20230403044223
HighPow Impedance: 50 Ohm
HighPow Impedance: 61 Ohm
Implantable Lead Implant Date: 20031007
Implantable Lead Implant Date: 20031007
Implantable Lead Implant Date: 20031007
Implantable Lead Implant Date: 20060227
Implantable Lead Location: 753858
Implantable Lead Location: 753859
Implantable Lead Location: 753860
Implantable Lead Location: 753860
Implantable Lead Model: 4193
Implantable Lead Model: 4469
Implantable Lead Model: 4470
Implantable Lead Model: 6949
Implantable Lead Serial Number: 337988
Implantable Lead Serial Number: 361236
Implantable Pulse Generator Implant Date: 20141022
Lead Channel Impedance Value: 342 Ohm
Lead Channel Impedance Value: 4047 Ohm
Lead Channel Impedance Value: 4047 Ohm
Lead Channel Impedance Value: 418 Ohm
Lead Channel Impedance Value: 456 Ohm
Lead Channel Impedance Value: 532 Ohm
Lead Channel Pacing Threshold Amplitude: 0.75 V
Lead Channel Pacing Threshold Amplitude: 1 V
Lead Channel Pacing Threshold Amplitude: 1.375 V
Lead Channel Pacing Threshold Pulse Width: 0.4 ms
Lead Channel Pacing Threshold Pulse Width: 0.4 ms
Lead Channel Pacing Threshold Pulse Width: 0.4 ms
Lead Channel Sensing Intrinsic Amplitude: 1.375 mV
Lead Channel Sensing Intrinsic Amplitude: 1.375 mV
Lead Channel Sensing Intrinsic Amplitude: 31.625 mV
Lead Channel Sensing Intrinsic Amplitude: 31.625 mV
Lead Channel Setting Pacing Amplitude: 2 V
Lead Channel Setting Pacing Amplitude: 2 V
Lead Channel Setting Pacing Amplitude: 2.75 V
Lead Channel Setting Pacing Pulse Width: 0.4 ms
Lead Channel Setting Pacing Pulse Width: 0.4 ms
Lead Channel Setting Sensing Sensitivity: 0.6 mV

## 2022-02-01 NOTE — Progress Notes (Signed)
EPIC Encounter for ICM Monitoring ? ?Patient Name: Grace Robinson is a 88 y.o. female ?Date: 02/01/2022 ?Primary Care Physican: Shon Baton, MD ?Primary Cardiologist: Caryl Comes ?Electrophysiologist: Caryl Comes ?Bi-V Pacing:  95.2%     ?Last Office Weight: 141 lbs ?  ?Since 31-Dec-2021 ?VT-NS (>4 beats, >130 bpm) 6 ?SVT: VT/VF Rx Withheld 1 ?Time in AT/AF  <0.1 hr/day (<0.1%) ?Longest AT/AF 50 seconds ?  ?Battery Longevity: 3 months ?  ?  ?      Spoke with patient and heart failure questions reviewed.  Pt asymptomatic for fluid accumulation.  Reports feeling well at this time and voices no complaints.  ?  ?Optivol Thoracic impedance suggesting normal fluid levels.    ?  ?Prescribed:  ?Furosemide 20 mg take 1 tablet (20 mg total) by mouth daily alternating (40 mg) every other day.   ?Potassium 10 mEq 1 tablet twice a day. ?  ?Labs: ?11/10/2020 Creatinine 1.08, BUN 14, Potassium 4.0, Sodium 143, GFR 45-51 ?  ?Recommendations:   Encouraged to call if experiencing any fluid symptoms. ?  ?Follow-up plan: ICM clinic phone appointment on 03/08/2022.   91 day device clinic remote transmission 03/04/2022.    ?  ?EP/Cardiology Office Visits:  02/11/2022 with Dr Caryl Comes.   ?  ?Copy of ICM check sent to Dr. Caryl Comes.    ? ?3 month ICM trend: 02/01/2022. ? ? ? ?12-14 Month ICM trend:  ? ? ? ?[name], RN ?02/01/2022 ?10:19 AM ? ?

## 2022-02-04 DIAGNOSIS — M25562 Pain in left knee: Secondary | ICD-10-CM | POA: Diagnosis not present

## 2022-02-04 DIAGNOSIS — M1711 Unilateral primary osteoarthritis, right knee: Secondary | ICD-10-CM | POA: Diagnosis not present

## 2022-02-04 DIAGNOSIS — M1712 Unilateral primary osteoarthritis, left knee: Secondary | ICD-10-CM | POA: Diagnosis not present

## 2022-02-04 DIAGNOSIS — M25561 Pain in right knee: Secondary | ICD-10-CM | POA: Diagnosis not present

## 2022-02-11 ENCOUNTER — Ambulatory Visit: Payer: Medicare Other | Admitting: Internal Medicine

## 2022-02-11 VITALS — BP 138/82 | HR 81 | Ht 66.0 in | Wt 137.0 lb

## 2022-02-11 DIAGNOSIS — Z95810 Presence of automatic (implantable) cardiac defibrillator: Secondary | ICD-10-CM | POA: Diagnosis not present

## 2022-02-11 DIAGNOSIS — I4729 Other ventricular tachycardia: Secondary | ICD-10-CM | POA: Diagnosis not present

## 2022-02-11 DIAGNOSIS — I5022 Chronic systolic (congestive) heart failure: Secondary | ICD-10-CM

## 2022-02-11 NOTE — Patient Instructions (Signed)
Medication Instructions:  Your physician recommends that you continue on your current medications as directed. Please refer to the Current Medication list given to you today.  *If you need a refill on your cardiac medications before your next appointment, please call your pharmacy*   Lab Work: None ordered.  If you have labs (blood work) drawn today and your tests are completely normal, you will receive your results only by: MyChart Message (if you have MyChart) OR A paper copy in the mail If you have any lab test that is abnormal or we need to change your treatment, we will call you to review the results.   Testing/Procedures: None ordered.    Follow-Up: At [facility], you and your health needs are our priority.  As part of our continuing mission to provide you with exceptional heart care, we have created designated Provider Care Teams.  These Care Teams include your primary Cardiologist (physician) and Advanced Practice Providers (APPs -  Physician Assistants and Nurse Practitioners) who all work together to provide you with the care you need, when you need it.  We recommend signing up for the patient portal called "MyChart".  Sign up information is provided on this After Visit Summary.  MyChart is used to connect with patients for Virtual Visits (Telemedicine).  Patients are able to view lab/test results, encounter notes, upcoming appointments, etc.  Non-urgent messages can be sent to your provider as well.   To learn more about what you can do with MyChart, go to [email or web address].    Your next appointment:    6 months with Dr Klein   Important Information About Sugar       
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
## 2022-02-12 NOTE — Progress Notes (Signed)
Remote ICD transmission.   

## 2022-02-15 ENCOUNTER — Telehealth: Payer: Self-pay | Admitting: Internal Medicine

## 2022-02-15 DIAGNOSIS — R2689 Other abnormalities of gait and mobility: Secondary | ICD-10-CM

## 2022-02-15 NOTE — Telephone Encounter (Signed)
Will send to Dr Caryl Comes to see if OK to place referral ?

## 2022-02-15 NOTE — Telephone Encounter (Signed)
Patient is calling stating that Dr. Caryl Comes has referred her to Dr. Vicie Mutters, she needs a referral sent over to his office.  ?

## 2022-02-15 NOTE — Telephone Encounter (Signed)
Of course!

## 2022-02-16 NOTE — Telephone Encounter (Signed)
Referral place For Dr Vicie Mutters per Dr Caryl Comes request. ?

## 2022-02-17 ENCOUNTER — Telehealth: Payer: Self-pay | Admitting: Internal Medicine

## 2022-02-17 NOTE — Telephone Encounter (Signed)
Patient had follow up questions for Dr Olin Pia RN following her past appt 02/11/22 ?

## 2022-02-18 NOTE — Telephone Encounter (Signed)
Spoke with pt who is asking about referral for Dr Thornell Mule, ENT as mentioned by Dr Caryl Comes.  Pt states she is going to see if her PCP will make the referral for insurance purposes and if not she will let RN know so our office can make the referral.  Pt thanked RN for the callback. ?
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
## 2022-03-02 ENCOUNTER — Encounter: Payer: Self-pay | Admitting: Neurology

## 2022-03-02 ENCOUNTER — Other Ambulatory Visit (INDEPENDENT_AMBULATORY_CARE_PROVIDER_SITE_OTHER): Payer: Medicare Other

## 2022-03-02 ENCOUNTER — Ambulatory Visit: Payer: Medicare Other | Admitting: Neurology

## 2022-03-02 VITALS — BP 124/78 | HR 71 | Ht 69.0 in | Wt 139.0 lb

## 2022-03-02 DIAGNOSIS — G609 Hereditary and idiopathic neuropathy, unspecified: Secondary | ICD-10-CM

## 2022-03-02 DIAGNOSIS — G6289 Other specified polyneuropathies: Secondary | ICD-10-CM

## 2022-03-02 DIAGNOSIS — R270 Ataxia, unspecified: Secondary | ICD-10-CM | POA: Diagnosis not present

## 2022-03-02 LAB — B12 AND FOLATE PANEL
Folate: 19.7 ng/mL (ref >5.9)
Vitamin B-12: >1504 pg/mL — ABNORMAL HIGH (ref 211–911)

## 2022-03-02 NOTE — Patient Instructions (Signed)
Your provider has requested that you have labwork completed today. The lab is located on the Second floor at [address], within the [facility] office. When you get off the elevator, turn right and go in the [facility] [address]; the first brown door on the left.  Tell the ladies behind the desk that you are there for lab work. If you are not called within 15 minutes please check with the front desk.   Once you complete your labs you are free to go. You will receive a call or message via MyChart with your lab results.    

## 2022-03-03 DIAGNOSIS — G609 Hereditary and idiopathic neuropathy, unspecified: Secondary | ICD-10-CM | POA: Diagnosis not present

## 2022-03-04 ENCOUNTER — Ambulatory Visit (INDEPENDENT_AMBULATORY_CARE_PROVIDER_SITE_OTHER): Payer: Medicare Other

## 2022-03-04 DIAGNOSIS — I5022 Chronic systolic (congestive) heart failure: Secondary | ICD-10-CM

## 2022-03-05 LAB — PROTEIN ELECTROPHORESIS, SERUM
Albumin ELP: 4.3 g/dL (ref 3.8–4.8)
Alpha 1: 0.3 g/dL (ref 0.2–0.3)
Alpha 2: 1.2 g/dL — ABNORMAL HIGH (ref 0.5–0.9)
Beta 2: 0.4 g/dL (ref 0.2–0.5)
Beta Globulin: 0.5 g/dL (ref 0.4–0.6)
Gamma Globulin: 0.7 g/dL — ABNORMAL LOW (ref 0.8–1.7)
Total Protein: 7.4 g/dL (ref 6.1–8.1)

## 2022-03-05 LAB — IMMUNOFIXATION ELECTROPHORESIS
IgG (Immunoglobin G), Serum: 658 mg/dL (ref 600–1540)
IgM, Serum: 27 mg/dL — ABNORMAL LOW (ref 50–300)
Immunofix Electr Int: NOT DETECTED
Immunoglobulin A: 294 mg/dL (ref 70–320)

## 2022-03-06 LAB — CUP PACEART REMOTE DEVICE CHECK
Battery Remaining Longevity: 3 mo
Battery Voltage: 2.78 V
Brady Statistic AP VP Percent: 40.48 %
Brady Statistic AP VS Percent: 0.59 %
Brady Statistic AS VP Percent: 58 %
Brady Statistic AS VS Percent: 0.93 %
Brady Statistic RA Percent Paced: 40.15 %
Brady Statistic RV Percent Paced: 0.25 %
Date Time Interrogation Session: 20230506042305
HighPow Impedance: 53 Ohm
HighPow Impedance: 63 Ohm
Implantable Lead Implant Date: 20031007
Implantable Lead Implant Date: 20031007
Implantable Lead Implant Date: 20031007
Implantable Lead Implant Date: 20060227
Implantable Lead Location: 753858
Implantable Lead Location: 753859
Implantable Lead Location: 753860
Implantable Lead Location: 753860
Implantable Lead Model: 4193
Implantable Lead Model: 4469
Implantable Lead Model: 4470
Implantable Lead Model: 6949
Implantable Lead Serial Number: 337988
Implantable Lead Serial Number: 361236
Implantable Pulse Generator Implant Date: 20141022
Lead Channel Impedance Value: 342 Ohm
Lead Channel Impedance Value: 4047 Ohm
Lead Channel Impedance Value: 4047 Ohm
Lead Channel Impedance Value: 456 Ohm
Lead Channel Impedance Value: 456 Ohm
Lead Channel Impedance Value: 532 Ohm
Lead Channel Pacing Threshold Amplitude: 0.75 V
Lead Channel Pacing Threshold Amplitude: 1.125 V
Lead Channel Pacing Threshold Amplitude: 1.375 V
Lead Channel Pacing Threshold Pulse Width: 0.4 ms
Lead Channel Pacing Threshold Pulse Width: 0.4 ms
Lead Channel Pacing Threshold Pulse Width: 0.4 ms
Lead Channel Sensing Intrinsic Amplitude: 1.25 mV
Lead Channel Sensing Intrinsic Amplitude: 1.25 mV
Lead Channel Sensing Intrinsic Amplitude: 30.25 mV
Lead Channel Sensing Intrinsic Amplitude: 30.25 mV
Lead Channel Setting Pacing Amplitude: 2 V
Lead Channel Setting Pacing Amplitude: 2.25 V
Lead Channel Setting Pacing Amplitude: 3 V
Lead Channel Setting Pacing Pulse Width: 0.4 ms
Lead Channel Setting Pacing Pulse Width: 0.4 ms
Lead Channel Setting Sensing Sensitivity: 0.6 mV

## 2022-03-08 ENCOUNTER — Ambulatory Visit (INDEPENDENT_AMBULATORY_CARE_PROVIDER_SITE_OTHER): Payer: Medicare Other

## 2022-03-08 DIAGNOSIS — I5022 Chronic systolic (congestive) heart failure: Secondary | ICD-10-CM | POA: Diagnosis not present

## 2022-03-08 DIAGNOSIS — Z95810 Presence of automatic (implantable) cardiac defibrillator: Secondary | ICD-10-CM | POA: Diagnosis not present

## 2022-03-10 LAB — PROTEIN ELECTRO, RANDOM URINE
Albumin ELP, Urine: 49.9 %
Alpha-1-Globulin, U: 2 %
Alpha-2-Globulin, U: 12.4 %
Beta Globulin, U: 20.2 %
Gamma Globulin, U: 15.5 %
Protein, Ur: 12.7 mg/dL

## 2022-03-10 LAB — IMMUNOFIXATION, URINE

## 2022-03-11 ENCOUNTER — Telehealth: Payer: Self-pay

## 2022-03-11 NOTE — Telephone Encounter (Signed)
Remote ICM transmission received.  Attempted call to patient regarding ICM remote transmission and left detailed message per DPR.  Advised to return call for any fluid symptoms or questions. Next ICM remote transmission scheduled 04/12/2022.   ? ?

## 2022-03-11 NOTE — Progress Notes (Signed)
Spoke with patient and heart failure questions reviewed.  Pt asymptomatic for fluid accumulation.  Reports feeling well at this time and voices no complaints.   She will be having PT.  She and Dr Caryl Comes discussed battery life and she will not have battery replaced.  Dr Caryl Comes discussed end of life with her and she has expressed her wishes with physician and family.   No changes and encouraged to call if experiencing any fluid symptoms. ?

## 2022-03-11 NOTE — Progress Notes (Signed)
EPIC Encounter for ICM Monitoring ? ?Patient Name: Grace Robinson is a 88 y.o. female ?Date: 03/11/2022 ?Primary Care Physican: Shon Baton, MD ?Primary Cardiologist: Caryl Comes ?Electrophysiologist: Caryl Comes ?Bi-V Pacing:  95.5%     ?Last Office Weight: 141 lbs ?  ?Since 11-Feb-2022 ?VT-NS (>4 beats, >130 bpm)    5 ?Time in AT/AF  <0.1 hr/day (<0.1%) ?  ?Battery Longevity: 3 months ?  ?  ?      Attempted call to patient and unable to reach.  Left detailed message per DPR regarding transmission. Transmission reviewed.   ?  ?Optivol Thoracic impedance suggesting normal fluid levels.    ?  ?Prescribed:  ?Furosemide 20 mg take 1 tablet (20 mg total) by mouth daily alternating (40 mg) every other day.   ?Potassium 10 mEq 1 tablet twice a day. ?  ?Labs: ?11/10/2020 Creatinine 1.08, BUN 14, Potassium 4.0, Sodium 143, GFR 45-51 ?  ?Recommendations:   Left voice mail with ICM number and encouraged to call if experiencing any fluid symptoms. ?  ?Follow-up plan: ICM clinic phone appointment on 04/12/2022.   91 day device clinic remote transmission 06/07/2022.    ?  ?EP/Cardiology Office Visits:  Recall 08/10/2022 with Dr Caryl Comes.   ?  ?Copy of ICM check sent to Dr. Caryl Comes.    ? ?3 month ICM trend: 03/08/2022. ? ? ? ?12-14 Month ICM trend:  ? ? ? ?[name], RN ?03/11/2022 ?8:17 AM ? ?

## 2022-03-15 DIAGNOSIS — R2681 Unsteadiness on feet: Secondary | ICD-10-CM | POA: Diagnosis not present

## 2022-03-15 DIAGNOSIS — R296 Repeated falls: Secondary | ICD-10-CM | POA: Diagnosis not present

## 2022-03-15 DIAGNOSIS — G9009 Other idiopathic peripheral autonomic neuropathy: Secondary | ICD-10-CM | POA: Diagnosis not present

## 2022-03-15 DIAGNOSIS — M6281 Muscle weakness (generalized): Secondary | ICD-10-CM | POA: Diagnosis not present

## 2022-03-17 NOTE — Progress Notes (Signed)
Remote ICD transmission.   

## 2022-03-18 DIAGNOSIS — R2681 Unsteadiness on feet: Secondary | ICD-10-CM | POA: Diagnosis not present

## 2022-03-18 DIAGNOSIS — R296 Repeated falls: Secondary | ICD-10-CM | POA: Diagnosis not present

## 2022-03-18 DIAGNOSIS — G9009 Other idiopathic peripheral autonomic neuropathy: Secondary | ICD-10-CM | POA: Diagnosis not present

## 2022-03-18 DIAGNOSIS — M6281 Muscle weakness (generalized): Secondary | ICD-10-CM | POA: Diagnosis not present

## 2022-03-22 DIAGNOSIS — G9009 Other idiopathic peripheral autonomic neuropathy: Secondary | ICD-10-CM | POA: Diagnosis not present

## 2022-03-22 DIAGNOSIS — R296 Repeated falls: Secondary | ICD-10-CM | POA: Diagnosis not present

## 2022-03-22 DIAGNOSIS — M6281 Muscle weakness (generalized): Secondary | ICD-10-CM | POA: Diagnosis not present

## 2022-03-22 DIAGNOSIS — R2681 Unsteadiness on feet: Secondary | ICD-10-CM | POA: Diagnosis not present

## 2022-03-23 ENCOUNTER — Other Ambulatory Visit: Payer: Medicare Other

## 2022-03-25 DIAGNOSIS — R296 Repeated falls: Secondary | ICD-10-CM | POA: Diagnosis not present

## 2022-03-25 DIAGNOSIS — R2681 Unsteadiness on feet: Secondary | ICD-10-CM | POA: Diagnosis not present

## 2022-03-25 DIAGNOSIS — M6281 Muscle weakness (generalized): Secondary | ICD-10-CM | POA: Diagnosis not present

## 2022-03-25 DIAGNOSIS — G9009 Other idiopathic peripheral autonomic neuropathy: Secondary | ICD-10-CM | POA: Diagnosis not present

## 2022-03-29 DIAGNOSIS — G9009 Other idiopathic peripheral autonomic neuropathy: Secondary | ICD-10-CM | POA: Diagnosis not present

## 2022-03-29 DIAGNOSIS — R296 Repeated falls: Secondary | ICD-10-CM | POA: Diagnosis not present

## 2022-03-29 DIAGNOSIS — R2681 Unsteadiness on feet: Secondary | ICD-10-CM | POA: Diagnosis not present

## 2022-03-29 DIAGNOSIS — M6281 Muscle weakness (generalized): Secondary | ICD-10-CM | POA: Diagnosis not present

## 2022-03-30 ENCOUNTER — Ambulatory Visit
Admission: RE | Admit: 2022-03-30 | Discharge: 2022-03-30 | Disposition: A | Discharge status: Home / Self Care | Payer: Medicare Other | Source: Ambulatory Visit | Attending: Neurology | Admitting: Neurology

## 2022-03-30 DIAGNOSIS — R270 Ataxia, unspecified: Secondary | ICD-10-CM

## 2022-03-30 DIAGNOSIS — I6523 Occlusion and stenosis of bilateral carotid arteries: Secondary | ICD-10-CM | POA: Diagnosis not present

## 2022-03-30 DIAGNOSIS — G6289 Other specified polyneuropathies: Secondary | ICD-10-CM

## 2022-04-01 ENCOUNTER — Telehealth: Payer: Self-pay | Admitting: Neurology

## 2022-04-01 DIAGNOSIS — M6281 Muscle weakness (generalized): Secondary | ICD-10-CM | POA: Diagnosis not present

## 2022-04-01 DIAGNOSIS — I6522 Occlusion and stenosis of left carotid artery: Secondary | ICD-10-CM

## 2022-04-01 DIAGNOSIS — R296 Repeated falls: Secondary | ICD-10-CM | POA: Diagnosis not present

## 2022-04-01 DIAGNOSIS — R2681 Unsteadiness on feet: Secondary | ICD-10-CM | POA: Diagnosis not present

## 2022-04-01 DIAGNOSIS — G9009 Other idiopathic peripheral autonomic neuropathy: Secondary | ICD-10-CM | POA: Diagnosis not present

## 2022-04-01 NOTE — Telephone Encounter (Signed)
Let pt know that carotid u/s showed some narrowing of carotid a on the L of 50-69%.  This is generally not enough narrowing to warrant surgery but we could get a consultation with vascular surgery if they would like to get an opinion.  She is already on asa.

## 2022-04-01 NOTE — Telephone Encounter (Signed)
Called patient and informed her of results and recommendations from Dr. Carles Collet. Asked patient if she would like me to place a referral to Vascular Surgery and patient stated yes. Informed patient that if she doesn't hear anything from Vascular in a week or so to give our office a call. Patient verbalized understanding.

## 2022-04-05 ENCOUNTER — Ambulatory Visit (INDEPENDENT_AMBULATORY_CARE_PROVIDER_SITE_OTHER): Payer: Medicare Other

## 2022-04-05 DIAGNOSIS — I428 Other cardiomyopathies: Secondary | ICD-10-CM

## 2022-04-06 LAB — CUP PACEART REMOTE DEVICE CHECK
Battery Remaining Longevity: 2 mo
Battery Voltage: 2.78 V
Brady Statistic AP VP Percent: 49.68 %
Brady Statistic AP VS Percent: 0.74 %
Brady Statistic AS VP Percent: 48.79 %
Brady Statistic AS VS Percent: 0.79 %
Brady Statistic RA Percent Paced: 49.76 %
Brady Statistic RV Percent Paced: 0.18 %
Date Time Interrogation Session: 20230606130407
HighPow Impedance: 50 Ohm
HighPow Impedance: 70 Ohm
Implantable Lead Implant Date: 20031007
Implantable Lead Implant Date: 20031007
Implantable Lead Implant Date: 20031007
Implantable Lead Implant Date: 20060227
Implantable Lead Location: 753858
Implantable Lead Location: 753859
Implantable Lead Location: 753860
Implantable Lead Location: 753860
Implantable Lead Model: 4193
Implantable Lead Model: 4469
Implantable Lead Model: 4470
Implantable Lead Model: 6949
Implantable Lead Serial Number: 337988
Implantable Lead Serial Number: 361236
Implantable Pulse Generator Implant Date: 20141022
Lead Channel Impedance Value: 342 Ohm
Lead Channel Impedance Value: 4047 Ohm
Lead Channel Impedance Value: 4047 Ohm
Lead Channel Impedance Value: 418 Ohm
Lead Channel Impedance Value: 475 Ohm
Lead Channel Impedance Value: 513 Ohm
Lead Channel Pacing Threshold Amplitude: 0.625 V
Lead Channel Pacing Threshold Amplitude: 1 V
Lead Channel Pacing Threshold Amplitude: 1.375 V
Lead Channel Pacing Threshold Pulse Width: 0.4 ms
Lead Channel Pacing Threshold Pulse Width: 0.4 ms
Lead Channel Pacing Threshold Pulse Width: 0.4 ms
Lead Channel Sensing Intrinsic Amplitude: 1.25 mV
Lead Channel Sensing Intrinsic Amplitude: 1.25 mV
Lead Channel Sensing Intrinsic Amplitude: 31.625 mV
Lead Channel Sensing Intrinsic Amplitude: 31.625 mV
Lead Channel Setting Pacing Amplitude: 2 V
Lead Channel Setting Pacing Amplitude: 2 V
Lead Channel Setting Pacing Amplitude: 3 V
Lead Channel Setting Pacing Pulse Width: 0.4 ms
Lead Channel Setting Pacing Pulse Width: 0.4 ms
Lead Channel Setting Sensing Sensitivity: 0.6 mV

## 2022-04-08 DIAGNOSIS — R296 Repeated falls: Secondary | ICD-10-CM | POA: Diagnosis not present

## 2022-04-08 DIAGNOSIS — M6281 Muscle weakness (generalized): Secondary | ICD-10-CM | POA: Diagnosis not present

## 2022-04-08 DIAGNOSIS — G9009 Other idiopathic peripheral autonomic neuropathy: Secondary | ICD-10-CM | POA: Diagnosis not present

## 2022-04-08 DIAGNOSIS — R2681 Unsteadiness on feet: Secondary | ICD-10-CM | POA: Diagnosis not present

## 2022-04-12 ENCOUNTER — Ambulatory Visit (INDEPENDENT_AMBULATORY_CARE_PROVIDER_SITE_OTHER): Payer: Medicare Other

## 2022-04-12 DIAGNOSIS — Z95810 Presence of automatic (implantable) cardiac defibrillator: Secondary | ICD-10-CM | POA: Diagnosis not present

## 2022-04-12 DIAGNOSIS — G9009 Other idiopathic peripheral autonomic neuropathy: Secondary | ICD-10-CM | POA: Diagnosis not present

## 2022-04-12 DIAGNOSIS — M6281 Muscle weakness (generalized): Secondary | ICD-10-CM | POA: Diagnosis not present

## 2022-04-12 DIAGNOSIS — R2681 Unsteadiness on feet: Secondary | ICD-10-CM | POA: Diagnosis not present

## 2022-04-12 DIAGNOSIS — R296 Repeated falls: Secondary | ICD-10-CM | POA: Diagnosis not present

## 2022-04-12 DIAGNOSIS — I5022 Chronic systolic (congestive) heart failure: Secondary | ICD-10-CM | POA: Diagnosis not present

## 2022-04-14 NOTE — Progress Notes (Signed)
EPIC Encounter for ICM Monitoring  Patient Name: KETURA SIREK is a 88 y.o. female Date: 04/14/2022 Primary Care Physican: Shon Baton, MD Primary Cardiologist: Caryl Comes Electrophysiologist: Vergie Living Pacing:  97.9%     Last Office Weight: 141 lbs   Time in AT/AF 0.0 hr/day (0.0%)   Battery Longevity: 2 months (Pt has discussed with Dr Caryl Comes and battery will not be replaced)           Spoke with patient and heart failure questions reviewed.  Pt asymptomatic for fluid accumulation.  She reports she has been thinking more about the discussion with Dr Caryl Comes about not replacing device battery.  She is wondering if that is the right decision.  She will think about it for next month and she may want to speak with Dr Caryl Comes again if she changes her mind.      Optivol Thoracic impedance suggesting normal fluid levels.      Prescribed:  Furosemide 20 mg take 1 tablet (20 mg total) by mouth daily alternating (40 mg) every other day.   Potassium 10 mEq 1 tablet twice a day.   Labs: 11/10/2020 Creatinine 1.08, BUN 14, Potassium 4.0, Sodium 143, GFR 45-51   Recommendations:   No changes and encouraged to call if experiencing any fluid symptoms.   Follow-up plan: ICM clinic phone appointment on 05/17/2022.   91 day device clinic remote transmission 06/07/2022.      EP/Cardiology Office Visits:  Recall 08/10/2022 with Dr Caryl Comes.     Copy of ICM check sent to Dr. Caryl Comes.      3 month ICM trend: 04/12/2022.    12-14 Month ICM trend:     [name], RN 04/14/2022 9:43 AM

## 2022-04-15 DIAGNOSIS — M6281 Muscle weakness (generalized): Secondary | ICD-10-CM | POA: Diagnosis not present

## 2022-04-15 DIAGNOSIS — R296 Repeated falls: Secondary | ICD-10-CM | POA: Diagnosis not present

## 2022-04-15 DIAGNOSIS — R2681 Unsteadiness on feet: Secondary | ICD-10-CM | POA: Diagnosis not present

## 2022-04-15 DIAGNOSIS — G9009 Other idiopathic peripheral autonomic neuropathy: Secondary | ICD-10-CM | POA: Diagnosis not present

## 2022-04-19 DIAGNOSIS — G9009 Other idiopathic peripheral autonomic neuropathy: Secondary | ICD-10-CM | POA: Diagnosis not present

## 2022-04-19 DIAGNOSIS — M6281 Muscle weakness (generalized): Secondary | ICD-10-CM | POA: Diagnosis not present

## 2022-04-19 DIAGNOSIS — R2681 Unsteadiness on feet: Secondary | ICD-10-CM | POA: Diagnosis not present

## 2022-04-19 DIAGNOSIS — R296 Repeated falls: Secondary | ICD-10-CM | POA: Diagnosis not present

## 2022-04-21 NOTE — Addendum Note (Signed)
Addended by: Cheri Kearns A on: 04/21/2022 01:31 PM   Modules accepted: Level of Service

## 2022-04-21 NOTE — Progress Notes (Signed)
Remote ICD transmission.   

## 2022-04-22 DIAGNOSIS — R2681 Unsteadiness on feet: Secondary | ICD-10-CM | POA: Diagnosis not present

## 2022-04-22 DIAGNOSIS — R296 Repeated falls: Secondary | ICD-10-CM | POA: Diagnosis not present

## 2022-04-22 DIAGNOSIS — G9009 Other idiopathic peripheral autonomic neuropathy: Secondary | ICD-10-CM | POA: Diagnosis not present

## 2022-04-22 DIAGNOSIS — M6281 Muscle weakness (generalized): Secondary | ICD-10-CM | POA: Diagnosis not present

## 2022-04-26 DIAGNOSIS — R2681 Unsteadiness on feet: Secondary | ICD-10-CM | POA: Diagnosis not present

## 2022-04-26 DIAGNOSIS — Z794 Long term (current) use of insulin: Secondary | ICD-10-CM | POA: Diagnosis not present

## 2022-04-26 DIAGNOSIS — E1122 Type 2 diabetes mellitus with diabetic chronic kidney disease: Secondary | ICD-10-CM | POA: Diagnosis not present

## 2022-04-26 DIAGNOSIS — M6281 Muscle weakness (generalized): Secondary | ICD-10-CM | POA: Diagnosis not present

## 2022-04-26 DIAGNOSIS — E1140 Type 2 diabetes mellitus with diabetic neuropathy, unspecified: Secondary | ICD-10-CM | POA: Diagnosis not present

## 2022-04-26 DIAGNOSIS — I2510 Atherosclerotic heart disease of native coronary artery without angina pectoris: Secondary | ICD-10-CM | POA: Diagnosis not present

## 2022-04-26 DIAGNOSIS — G9009 Other idiopathic peripheral autonomic neuropathy: Secondary | ICD-10-CM | POA: Diagnosis not present

## 2022-04-26 DIAGNOSIS — R296 Repeated falls: Secondary | ICD-10-CM | POA: Diagnosis not present

## 2022-04-29 ENCOUNTER — Encounter: Payer: Self-pay | Admitting: Vascular Surgery

## 2022-04-29 ENCOUNTER — Ambulatory Visit: Payer: Medicare Other | Admitting: Vascular Surgery

## 2022-04-29 VITALS — BP 165/83 | HR 75 | Temp 98.3°F | Resp 20 | Ht 66.0 in | Wt 140.0 lb

## 2022-04-29 DIAGNOSIS — I6522 Occlusion and stenosis of left carotid artery: Secondary | ICD-10-CM | POA: Diagnosis not present

## 2022-04-29 NOTE — Progress Notes (Signed)
ASSESSMENT & PLAN   ASYMPTOMATIC MODERATE LEFT CAROTID STENOSIS: This patient likely has a 40 to 50% left carotid stenosis with no significant stenosis on the right.  She is asymptomatic.  We have reviewed the potential symptoms of cerebrovascular disease and she knows to call if she develops any new symptoms.  I explained that we would not consider carotid endarterectomy in a normal risk patient unless the stenosis progressed to greater than 80%.  She is on aspirin and is on a statin.  I encouraged her to continue to exercise which she does.  We also discussed the importance of nutrition.  She is not a smoker.  I have ordered a follow-up carotid duplex scan in 1 year and we will see her back at that time.  She will be seen on the PA schedule.  She knows to call sooner if she has problems.  REASON FOR CONSULT:    Carotid disease.  The consult is requested by Dr. Wells Guiles Tat.  HPI:   Grace Robinson is a 88 y.o. female who was referred with a moderate left carotid stenosis.  She was having problems with ataxia and this prompted a carotid duplex scan which showed a moderate left carotid stenosis and no significant stenosis on the right.  Of note she had no evidence of vertebrobasilar insufficiency on her duplex.  On my history she is right-handed.  She denies any history of stroke, TIAs, expressive or receptive aphasia, or amaurosis fugax.  She is on aspirin and is on a statin.  She is not a smoker.  She exercises at her living facility.  Past Medical History:  Diagnosis Date   Biventricular ICD (implantable cardiac defibrillator) in place    Cellulitis    Change in bowel habits    CHF (congestive heart failure) (HCC)    Colon polyps    Diabetes mellitus    Type II   Diarrhea    Dyslipidemia    HOH (hard of hearing)    left   HTN (hypertension)    Ischemic cardiomyopathy    LBBB (left bundle branch block)    chronic   Meniere's disease    T wave oversensing 01/17/2014   Ulcerative  colitis    Varicose veins    4 lazer  treatment each leg    Family History  Problem Relation Age of Onset   Emphysema Father 55   Heart disease Father    Breast cancer Mother    Lung cancer Mother    Healthy Child    Colon cancer Neg Hx    Rectal cancer Neg Hx    Stomach cancer Neg Hx    Liver cancer Neg Hx    Esophageal cancer Neg Hx     SOCIAL HISTORY: Social History   Tobacco Use   Smoking status: Former    Years: 1.00    Types: Cigarettes    Passive exposure: Never   Smokeless tobacco: Never   Tobacco comments:    quit in 1970  Substance Use Topics   Alcohol use: No    Allergies  Allergen Reactions   Carvedilol Other (See Comments)   Digoxin     Other reaction(s): dig toxicity   Vancomycin Other (See Comments)   Sudafed [Pseudoephedrine] Other (See Comments)    Makes skin feel like its crawling    Current Outpatient Medications  Medication Sig Dispense Refill   acetaminophen (TYLENOL) 500 MG tablet Take 500 mg by mouth as needed for mild pain.  aspirin EC 81 MG tablet Take 81 mg by mouth daily. Swallow whole.     atorvastatin (LIPITOR) 40 MG tablet Take 40 mg by mouth daily.  3   Cholecalciferol (VITAMIN D-3) 25 MCG (1000 UT) CAPS Take 2 capsules by mouth daily.     cholestyramine (QUESTRAN) 4 g packet Take 1 packet (4 g total) by mouth daily. 30 each 11   dicyclomine (BENTYL) 10 MG capsule TAKE ONE CAPSULE BY MOUTH THREE TIMES DAILY, 30 MINUTES BEFORE MEALS 90 capsule 6   furosemide (LASIX) 20 MG tablet Take 1 tablet (20 mg total) by mouth daily. alternating (40 mg) every other day. 60 tablet 11   HUMALOG MIX 75/25 (75-25) 100 UNIT/ML SUSP injection Inject 32 Units into the skin 2 (two) times daily with a meal.   1   Lancets (ONETOUCH DELICA PLUS [id number]) MISC CHECK BLOOD SUGAR TWICE DAILY     loperamide (IMODIUM A-D) 2 MG tablet Take 2 mg by mouth as needed for diarrhea or loose stools.     metoprolol succinate (TOPROL-XL) 100 MG 24 hr tablet Take 1  tablet by mouth 2 (two) times daily.     olmesartan (BENICAR) 40 MG tablet Take 40 mg by mouth daily.  3   ONETOUCH VERIO test strip 2 (two) times daily.     potassium chloride (K-DUR) 10 MEQ tablet Take 1 tablet by mouth 2 (two) times daily.     vitamin B-12 (CYANOCOBALAMIN) 1000 MCG tablet Take 1,000 mcg by mouth daily.     vitamin E 400 UNIT capsule Take 400 Units by mouth daily.     Biotin 5000 MCG CAPS Take 1 capsule by mouth daily. (Patient not taking: Reported on 04/29/2022)     traMADol (ULTRAM) 50 MG tablet Take 0.5 tablets (25 mg total) by mouth every 6 (six) hours as needed. (Patient not taking: Reported on 04/29/2022) 15 tablet 0   No current facility-administered medications for this visit.    REVIEW OF SYSTEMS:  '[X]'$  denotes positive finding, '[ ]'$  denotes negative finding Cardiac  Comments:  Chest pain or chest pressure:    Shortness of breath upon exertion:    Short of breath when lying flat:    Irregular heart rhythm:        Vascular    Pain in calf, thigh, or hip brought on by ambulation:    Pain in feet at night that wakes you up from your sleep:     Blood clot in your veins:    Leg swelling:  x       Pulmonary    Oxygen at home:    Productive cough:     Wheezing:         Neurologic    Sudden weakness in arms or legs:     Sudden numbness in arms or legs:     Sudden onset of difficulty speaking or slurred speech:    Temporary loss of vision in one eye:     Problems with dizziness:         Gastrointestinal    Blood in stool:     Vomited blood:         Genitourinary    Burning when urinating:     Blood in urine:        Psychiatric    Major depression:         Hematologic    Bleeding problems:    Problems with blood clotting too easily:  Skin    Rashes or ulcers:        Constitutional    Fever or chills:    -  PHYSICAL EXAM:   Vitals:   04/29/22 1222 04/29/22 1224  BP: (!) 167/85 (!) 165/83  Pulse: 75   Resp: 20   Temp: 98.3 F (36.8  C)   TempSrc: Temporal   SpO2: 96%   Weight: 140 lb (63.5 kg)   Height: '5\' 6"'$  (1.676 m)    Body mass index is 22.6 kg/m. GENERAL: The patient is a well-nourished female, in no acute distress. The vital signs are documented above. CARDIAC: There is a regular rate and rhythm.  VASCULAR: I do not detect carotid bruits. She has palpable femoral, popliteal, and posterior tibial pulses bilaterally. PULMONARY: There is good air exchange bilaterally without wheezing or rales. ABDOMEN: Soft and non-tender with normal pitched bowel sounds.  I do not palpate any aneurysm. MUSCULOSKELETAL: There are no major deformities. NEUROLOGIC: No focal weakness or paresthesias are detected. SKIN: There are no ulcers or rashes noted. PSYCHIATRIC: The patient has a normal affect.  DATA:    CAROTID DUPLEX: I have reviewed the carotid duplex scan that was done on 03/30/2022.  On the right side there were no elevated velocities to suggest a carotid stenosis.  The right vertebral artery was patent with antegrade flow.  On the left side there was evidence of a 50 to 69% stenosis by their velocity criteria.  Peak systolic velocity was 355 cm/s with an end-diastolic velocity of 25 cm/s.  Based on our criteria this would likely be a less than 39% stenosis.  The left vertebral artery is patent with antegrade flow  Deitra Mayo [facility]

## 2022-04-30 DIAGNOSIS — E1122 Type 2 diabetes mellitus with diabetic chronic kidney disease: Secondary | ICD-10-CM | POA: Diagnosis not present

## 2022-04-30 DIAGNOSIS — R2681 Unsteadiness on feet: Secondary | ICD-10-CM | POA: Diagnosis not present

## 2022-04-30 DIAGNOSIS — G9009 Other idiopathic peripheral autonomic neuropathy: Secondary | ICD-10-CM | POA: Diagnosis not present

## 2022-04-30 DIAGNOSIS — I130 Hypertensive heart and chronic kidney disease with heart failure and stage 1 through stage 4 chronic kidney disease, or unspecified chronic kidney disease: Secondary | ICD-10-CM | POA: Diagnosis not present

## 2022-04-30 DIAGNOSIS — E785 Hyperlipidemia, unspecified: Secondary | ICD-10-CM | POA: Diagnosis not present

## 2022-04-30 DIAGNOSIS — M6281 Muscle weakness (generalized): Secondary | ICD-10-CM | POA: Diagnosis not present

## 2022-04-30 DIAGNOSIS — R296 Repeated falls: Secondary | ICD-10-CM | POA: Diagnosis not present

## 2022-04-30 DIAGNOSIS — I420 Dilated cardiomyopathy: Secondary | ICD-10-CM | POA: Diagnosis not present

## 2022-05-05 DIAGNOSIS — G9009 Other idiopathic peripheral autonomic neuropathy: Secondary | ICD-10-CM | POA: Diagnosis not present

## 2022-05-05 DIAGNOSIS — R2681 Unsteadiness on feet: Secondary | ICD-10-CM | POA: Diagnosis not present

## 2022-05-05 DIAGNOSIS — M6281 Muscle weakness (generalized): Secondary | ICD-10-CM | POA: Diagnosis not present

## 2022-05-05 DIAGNOSIS — R296 Repeated falls: Secondary | ICD-10-CM | POA: Diagnosis not present

## 2022-05-06 ENCOUNTER — Ambulatory Visit (INDEPENDENT_AMBULATORY_CARE_PROVIDER_SITE_OTHER): Payer: Medicare Other

## 2022-05-06 DIAGNOSIS — I428 Other cardiomyopathies: Secondary | ICD-10-CM

## 2022-05-07 LAB — CUP PACEART REMOTE DEVICE CHECK
Battery Remaining Longevity: 1 mo
Battery Voltage: 2.74 V
Brady Statistic AP VP Percent: 45.91 %
Brady Statistic AP VS Percent: 0.67 %
Brady Statistic AS VP Percent: 52.56 %
Brady Statistic AS VS Percent: 0.85 %
Brady Statistic RA Percent Paced: 45.97 %
Brady Statistic RV Percent Paced: 0.36 %
Date Time Interrogation Session: 20230706022604
HighPow Impedance: 50 Ohm
HighPow Impedance: 70 Ohm
Implantable Lead Implant Date: 20031007
Implantable Lead Implant Date: 20031007
Implantable Lead Implant Date: 20031007
Implantable Lead Implant Date: 20060227
Implantable Lead Location: 753858
Implantable Lead Location: 753859
Implantable Lead Location: 753860
Implantable Lead Location: 753860
Implantable Lead Model: 4193
Implantable Lead Model: 4469
Implantable Lead Model: 4470
Implantable Lead Model: 6949
Implantable Lead Serial Number: 337988
Implantable Lead Serial Number: 361236
Implantable Pulse Generator Implant Date: 20141022
Lead Channel Impedance Value: 361 Ohm
Lead Channel Impedance Value: 4047 Ohm
Lead Channel Impedance Value: 4047 Ohm
Lead Channel Impedance Value: 475 Ohm
Lead Channel Impedance Value: 475 Ohm
Lead Channel Impedance Value: 551 Ohm
Lead Channel Pacing Threshold Amplitude: 0.625 V
Lead Channel Pacing Threshold Amplitude: 1.125 V
Lead Channel Pacing Threshold Amplitude: 1.5 V
Lead Channel Pacing Threshold Pulse Width: 0.4 ms
Lead Channel Pacing Threshold Pulse Width: 0.4 ms
Lead Channel Pacing Threshold Pulse Width: 0.4 ms
Lead Channel Sensing Intrinsic Amplitude: 1.25 mV
Lead Channel Sensing Intrinsic Amplitude: 1.25 mV
Lead Channel Sensing Intrinsic Amplitude: 30.5 mV
Lead Channel Sensing Intrinsic Amplitude: 30.5 mV
Lead Channel Setting Pacing Amplitude: 2 V
Lead Channel Setting Pacing Amplitude: 2.25 V
Lead Channel Setting Pacing Amplitude: 3 V
Lead Channel Setting Pacing Pulse Width: 0.4 ms
Lead Channel Setting Pacing Pulse Width: 0.4 ms
Lead Channel Setting Sensing Sensitivity: 0.6 mV

## 2022-05-12 DIAGNOSIS — M6281 Muscle weakness (generalized): Secondary | ICD-10-CM | POA: Diagnosis not present

## 2022-05-12 DIAGNOSIS — G9009 Other idiopathic peripheral autonomic neuropathy: Secondary | ICD-10-CM | POA: Diagnosis not present

## 2022-05-12 DIAGNOSIS — R296 Repeated falls: Secondary | ICD-10-CM | POA: Diagnosis not present

## 2022-05-12 DIAGNOSIS — R2681 Unsteadiness on feet: Secondary | ICD-10-CM | POA: Diagnosis not present

## 2022-05-13 DIAGNOSIS — M6281 Muscle weakness (generalized): Secondary | ICD-10-CM | POA: Diagnosis not present

## 2022-05-13 DIAGNOSIS — R296 Repeated falls: Secondary | ICD-10-CM | POA: Diagnosis not present

## 2022-05-13 DIAGNOSIS — G9009 Other idiopathic peripheral autonomic neuropathy: Secondary | ICD-10-CM | POA: Diagnosis not present

## 2022-05-13 DIAGNOSIS — R2681 Unsteadiness on feet: Secondary | ICD-10-CM | POA: Diagnosis not present

## 2022-05-17 ENCOUNTER — Ambulatory Visit (INDEPENDENT_AMBULATORY_CARE_PROVIDER_SITE_OTHER): Payer: Medicare Other

## 2022-05-17 DIAGNOSIS — R296 Repeated falls: Secondary | ICD-10-CM | POA: Diagnosis not present

## 2022-05-17 DIAGNOSIS — M6281 Muscle weakness (generalized): Secondary | ICD-10-CM | POA: Diagnosis not present

## 2022-05-17 DIAGNOSIS — Z95810 Presence of automatic (implantable) cardiac defibrillator: Secondary | ICD-10-CM

## 2022-05-17 DIAGNOSIS — I5022 Chronic systolic (congestive) heart failure: Secondary | ICD-10-CM

## 2022-05-17 DIAGNOSIS — G9009 Other idiopathic peripheral autonomic neuropathy: Secondary | ICD-10-CM | POA: Diagnosis not present

## 2022-05-17 DIAGNOSIS — R2681 Unsteadiness on feet: Secondary | ICD-10-CM | POA: Diagnosis not present

## 2022-05-20 DIAGNOSIS — G9009 Other idiopathic peripheral autonomic neuropathy: Secondary | ICD-10-CM | POA: Diagnosis not present

## 2022-05-20 DIAGNOSIS — R296 Repeated falls: Secondary | ICD-10-CM | POA: Diagnosis not present

## 2022-05-20 DIAGNOSIS — M6281 Muscle weakness (generalized): Secondary | ICD-10-CM | POA: Diagnosis not present

## 2022-05-20 DIAGNOSIS — R2681 Unsteadiness on feet: Secondary | ICD-10-CM | POA: Diagnosis not present

## 2022-05-21 ENCOUNTER — Telehealth: Payer: Self-pay

## 2022-05-21 NOTE — Telephone Encounter (Signed)
Remote ICM transmission received.  Attempted call to patient regarding ICM remote transmission and left detailed message per DPR.  Advised to return call for any fluid symptoms or questions. Next ICM remote transmission scheduled 06/21/2022.

## 2022-05-21 NOTE — Progress Notes (Signed)
EPIC Encounter for ICM Monitoring  Patient Name: Grace Robinson is a 88 y.o. female Date: 05/21/2022 Primary Care Physican: Shon Baton, MD Primary Cardiologist: Caryl Comes Electrophysiologist: Vergie Living Pacing:  97.4%     Last Office Weight: 141 lbs   Time in AT/AF  0.0 hr/day (0.0%)   Battery Longevity: 1 month (Pt has discussed with Dr Caryl Comes and battery will not be replaced)           Attempted call to patient and unable to reach.  Left detailed message per DPR regarding transmission. Transmission reviewed.    Optivol Thoracic impedance suggesting normal fluid levels.      Prescribed:  Furosemide 20 mg take 1 tablet (20 mg total) by mouth daily alternating (40 mg) every other day.   Potassium 10 mEq 1 tablet twice a day.   Labs: 11/10/2020 Creatinine 1.08, BUN 14, Potassium 4.0, Sodium 143, GFR 45-51   Recommendations:  Left voice mail with ICM number and encouraged to call if experiencing any fluid symptoms.    Follow-up plan: ICM clinic phone appointment on 06/21/2022.   91 day device clinic remote transmission 06/07/2022.      EP/Cardiology Office Visits:  Recall 08/10/2022 with Dr Caryl Comes.     Copy of ICM check sent to Dr. Caryl Comes.      3 month ICM trend: 05/21/2022.    12-14 Month ICM trend:     [name], RN 05/21/2022 10:12 AM

## 2022-05-24 DIAGNOSIS — M6281 Muscle weakness (generalized): Secondary | ICD-10-CM | POA: Diagnosis not present

## 2022-05-24 DIAGNOSIS — R2681 Unsteadiness on feet: Secondary | ICD-10-CM | POA: Diagnosis not present

## 2022-05-24 DIAGNOSIS — G9009 Other idiopathic peripheral autonomic neuropathy: Secondary | ICD-10-CM | POA: Diagnosis not present

## 2022-05-24 DIAGNOSIS — R296 Repeated falls: Secondary | ICD-10-CM | POA: Diagnosis not present

## 2022-05-24 NOTE — Addendum Note (Signed)
Addended by: Cheri Kearns A on: 05/24/2022 02:35 PM   Modules accepted: Level of Service

## 2022-05-24 NOTE — Progress Notes (Signed)
Remote ICD transmission.   

## 2022-05-27 DIAGNOSIS — M6281 Muscle weakness (generalized): Secondary | ICD-10-CM | POA: Diagnosis not present

## 2022-05-27 DIAGNOSIS — R2681 Unsteadiness on feet: Secondary | ICD-10-CM | POA: Diagnosis not present

## 2022-05-27 DIAGNOSIS — G9009 Other idiopathic peripheral autonomic neuropathy: Secondary | ICD-10-CM | POA: Diagnosis not present

## 2022-05-27 DIAGNOSIS — R296 Repeated falls: Secondary | ICD-10-CM | POA: Diagnosis not present

## 2022-05-31 DIAGNOSIS — R296 Repeated falls: Secondary | ICD-10-CM | POA: Diagnosis not present

## 2022-05-31 DIAGNOSIS — M6281 Muscle weakness (generalized): Secondary | ICD-10-CM | POA: Diagnosis not present

## 2022-05-31 DIAGNOSIS — R2681 Unsteadiness on feet: Secondary | ICD-10-CM | POA: Diagnosis not present

## 2022-05-31 DIAGNOSIS — G9009 Other idiopathic peripheral autonomic neuropathy: Secondary | ICD-10-CM | POA: Diagnosis not present

## 2022-06-07 ENCOUNTER — Ambulatory Visit (INDEPENDENT_AMBULATORY_CARE_PROVIDER_SITE_OTHER): Payer: Medicare Other

## 2022-06-07 DIAGNOSIS — I5022 Chronic systolic (congestive) heart failure: Secondary | ICD-10-CM

## 2022-06-08 LAB — CUP PACEART REMOTE DEVICE CHECK
Battery Remaining Longevity: 1 mo
Battery Voltage: 2.74 V
Brady Statistic AP VP Percent: 36.26 %
Brady Statistic AP VS Percent: 0.56 %
Brady Statistic AS VP Percent: 62.19 %
Brady Statistic AS VS Percent: 0.98 %
Brady Statistic RA Percent Paced: 36.56 %
Brady Statistic RV Percent Paced: 0.23 %
Date Time Interrogation Session: 20230807033322
HighPow Impedance: 49 Ohm
HighPow Impedance: 63 Ohm
Implantable Lead Implant Date: 20031007
Implantable Lead Implant Date: 20031007
Implantable Lead Implant Date: 20031007
Implantable Lead Implant Date: 20060227
Implantable Lead Location: 753858
Implantable Lead Location: 753859
Implantable Lead Location: 753860
Implantable Lead Location: 753860
Implantable Lead Model: 4193
Implantable Lead Model: 4469
Implantable Lead Model: 4470
Implantable Lead Model: 6949
Implantable Lead Serial Number: 337988
Implantable Lead Serial Number: 361236
Implantable Pulse Generator Implant Date: 20141022
Lead Channel Impedance Value: 304 Ohm
Lead Channel Impedance Value: 4047 Ohm
Lead Channel Impedance Value: 4047 Ohm
Lead Channel Impedance Value: 456 Ohm
Lead Channel Impedance Value: 456 Ohm
Lead Channel Impedance Value: 475 Ohm
Lead Channel Pacing Threshold Amplitude: 0.625 V
Lead Channel Pacing Threshold Amplitude: 1 V
Lead Channel Pacing Threshold Amplitude: 1.5 V
Lead Channel Pacing Threshold Pulse Width: 0.4 ms
Lead Channel Pacing Threshold Pulse Width: 0.4 ms
Lead Channel Pacing Threshold Pulse Width: 0.4 ms
Lead Channel Sensing Intrinsic Amplitude: 1.375 mV
Lead Channel Sensing Intrinsic Amplitude: 1.375 mV
Lead Channel Sensing Intrinsic Amplitude: 28.5 mV
Lead Channel Sensing Intrinsic Amplitude: 28.5 mV
Lead Channel Setting Pacing Amplitude: 2 V
Lead Channel Setting Pacing Amplitude: 2 V
Lead Channel Setting Pacing Amplitude: 4 V
Lead Channel Setting Pacing Pulse Width: 0.4 ms
Lead Channel Setting Pacing Pulse Width: 0.4 ms
Lead Channel Setting Sensing Sensitivity: 0.6 mV

## 2022-06-21 ENCOUNTER — Other Ambulatory Visit: Payer: Self-pay | Admitting: Physician Assistant

## 2022-06-21 ENCOUNTER — Ambulatory Visit (INDEPENDENT_AMBULATORY_CARE_PROVIDER_SITE_OTHER): Payer: Medicare Other

## 2022-06-21 DIAGNOSIS — I5022 Chronic systolic (congestive) heart failure: Secondary | ICD-10-CM | POA: Diagnosis not present

## 2022-06-21 DIAGNOSIS — Z95810 Presence of automatic (implantable) cardiac defibrillator: Secondary | ICD-10-CM

## 2022-06-25 NOTE — Progress Notes (Signed)
EPIC Encounter for ICM Monitoring  Patient Name: Grace Robinson is a 88 y.o. female Date: 06/25/2022 Primary Care Physican: Shon Baton, MD Primary Cardiologist: Caryl Comes Electrophysiologist: Vergie Living Pacing:  97.7%     Last Office Weight: 141 lbs   Time in AT/AF  <0.1 hr/day (<0.1%)   Battery Longevity: 1 month (Pt has discussed with Dr Caryl Comes and battery will not be replaced)           Spoke with patient and heart failure questions reviewed.  Pt asymptomatic for fluid accumulation.  Reports feeling well at this time and voices no complaints.  She has discussed not having battery replacement but questioning her decision.  She will think about if she wants to have the battery replaced over the next month.  Advised after battery reaches replacement it will still have 3 months left.  Offered to have Dr Caryl Comes call her to discuss again and she would like to wait until next month after she thinks about it a little longer.      Optivol Thoracic impedance suggesting normal fluid levels.      Prescribed:  Furosemide 20 mg take 1 tablet (20 mg total) by mouth daily alternating (40 mg) every other day.   Potassium 10 mEq 1 tablet twice a day.   Labs: 11/10/2020 Creatinine 1.08, BUN 14, Potassium 4.0, Sodium 143, GFR 45-51   Recommendations:  No changes and encouraged to call if experiencing any fluid symptoms.   Follow-up plan: ICM clinic phone appointment on 07/26/2022.   91 day device clinic remote transmission not scheduled.      EP/Cardiology Office Visits:  Recall 08/10/2022 with Dr Caryl Comes.     Copy of ICM check sent to Dr. Caryl Comes.     3 month ICM trend: 06/21/2022.    12-14 Month ICM trend:     [name], RN 06/25/2022 9:52 AM

## 2022-06-28 ENCOUNTER — Telehealth: Payer: Self-pay | Admitting: Physician Assistant

## 2022-06-28 MED ORDER — DICYCLOMINE HCL 10 MG PO CAPS: ORAL_CAPSULE | ORAL | 1 refills | Status: DC

## 2022-06-28 NOTE — Telephone Encounter (Signed)
Patient called to schedule yearly follow up visit. No avail appt.for sep/oct. Will call back when Nov. Schedule is out. Would like to get her Dicyclomine refilled if possible. Patient would like a call back, thank you.

## 2022-06-28 NOTE — Telephone Encounter (Signed)
Called patient and advised her to call the office later this week, early next week to schedule for October. 30 days with 1 refill has been sent to her pharmacy. Patient expressed understand and was appreciative.

## 2022-06-29 ENCOUNTER — Other Ambulatory Visit (HOSPITAL_COMMUNITY): Payer: Self-pay

## 2022-06-30 ENCOUNTER — Telehealth: Payer: Self-pay

## 2022-06-30 ENCOUNTER — Other Ambulatory Visit (HOSPITAL_COMMUNITY): Payer: Self-pay

## 2022-06-30 NOTE — Telephone Encounter (Addendum)
Patient Advocate Encounter   Received notification from [facility] that prior authorization is required for Dicyclomine. PA submitted and APPROVED on 06/30/2022.  Key [id number]  Effective: 06/30/2022 - 07/01/2023  Clista Bernhardt, CPhT Rx Patient Advocate Phone: [phone]

## 2022-07-07 NOTE — Addendum Note (Signed)
Addended by: Douglass Rivers D on: 07/07/2022 01:34 PM   Modules accepted: Level of Service

## 2022-07-07 NOTE — Progress Notes (Signed)
Remote ICD transmission.   

## 2022-07-09 ENCOUNTER — Telehealth: Payer: Self-pay

## 2022-07-09 NOTE — Telephone Encounter (Signed)
Received call from patient.  She reported she had received a call to send a manual remote transmission but was unable to do so.  She contacted [employer] tech support and they are sending a new monitor in 7-10 days.  She will call back when she receives the monitor.

## 2022-07-14 DIAGNOSIS — L820 Inflamed seborrheic keratosis: Secondary | ICD-10-CM | POA: Diagnosis not present

## 2022-07-14 DIAGNOSIS — L570 Actinic keratosis: Secondary | ICD-10-CM | POA: Diagnosis not present

## 2022-07-14 DIAGNOSIS — Z85828 Personal history of other malignant neoplasm of skin: Secondary | ICD-10-CM | POA: Diagnosis not present

## 2022-07-14 DIAGNOSIS — D1801 Hemangioma of skin and subcutaneous tissue: Secondary | ICD-10-CM | POA: Diagnosis not present

## 2022-07-19 ENCOUNTER — Telehealth: Payer: Self-pay

## 2022-07-19 NOTE — Telephone Encounter (Signed)
Called patient to advise her device has reached replacement time and will need in-clinic apt to discuss with Dr. Caryl Comes. Patient voiced understanding. LW

## 2022-07-19 NOTE — Telephone Encounter (Signed)
Returned call to patient regarding battery replacement and she would like to have her device battery replacement. She has discussed with her family and has decided she would like to have the battery replaced. She did discuss with Dr Caryl Comes in April if she would like to opt not replacing the battery but has since given it more thought and will move forward with replacement. She did speak with device clinic nurse this AM regarding battery reached ERI on 9/14 and call was forwarded to Dr Aquilla Hacker nurse regarding battery replacement.

## 2022-07-19 NOTE — Telephone Encounter (Signed)
Pt called to discuss having battery replacement for device.  She reports she and Dr Caryl Comes did discuss battery replacement in April but since then she has decided that she would like to have it replaced.  Advised Dr Olin Pia nurse has been notified about getting an appointment set up with Dr Caryl Comes regarding her decision about battery replacement.

## 2022-07-20 NOTE — Telephone Encounter (Signed)
Pt is scheduled with Tommye Standard, PA-C 08/02/2022.

## 2022-08-01 NOTE — H&P (View-Only) (Signed)
Cardiology Office Note Date:  08/01/2022  Patient ID:  Grace Robinson, Grace Robinson 12/01/1925, MRN [id number] PCP:  Shon Baton, MD  Cardiologist:  Dr. Glynda Jaeger. Servando Snare, NP (retired) Electrophysiologist: Dr. Caryl Comes    Chief Complaint:   ERI  History of Present Illness: Grace Robinson is a 88 y.o. female with history of NICM, VT, chronic CHF (systolic), DM, HTN, HLD, LBBB, CRT-D, ulcerative colitis, Meniere's disease, suspect TIA  Recovered LVEF by echo 2015  Resides at [facility]  She comes in today to be seen for Dr. Caryl Comes, last seen by him via telehealth 07/2020, at that time, mentions hx of having had ATP in 2019 and 2020.  No changes were made, she was doing well  She saw L. Servando Snare, NP in Jan 2022, notes back in April 2021, has transient symptoms of R hand weakness, CT and carotids were OK, she declined referral to neurology At this visit in Jan she was accompanied by her son, maintained on full dose ASA after TIA-like event. Dependent edema noted variocities, weight was stable, support stockings helped.   I saw her May 2022, having been lost to EP in-clinic follow up for some time. This is the 1st visit in  Clinic with EP for a couple years. She is accompanied by her son. She feels like she is doing well. No cardiac awareness, no CP, palpitations Denies rest SOB, minimal DOE with longer walking. No near syncope or syncope. Chronic balance issues, denies falls Her PMD does her labs, lipids She had some NSVTs and SVTs An appropriate ATP tx for MMVT She also had a couple short AF episodes, in d/w pt/son, no a/c yet.  She saw Dr. Caryl Comes April 2023, looks like they discussed her device nearing ERI and all the potential options, at the time of her visit then, she preferred not to pursue any device change and when her life ends/heart stops, she was at peace with dying. He also discussed noted nystagmus and planned to reach out to Drs Tat and Virgina Jock for their thoughts/?neuro  eval.  She is followed by [facility] RN as well. Pending new home monitor  07/19/22 RN notes that ERI had been triggered, pt voiced that she had reconsidered and would like her device replaced.  TODAY She is accompanied by her son. She is doing very well No CP, denies SOB, minimal if any DOE. No near syncope or syncope. No shocks  She tells me she does NOT want a battery change She does not want programmiong changes today to turn off therapies, but des not want a new ICD when this one wears out.  We discussed that her heart beat (noting underlying SR in the 60's with intact conduction) does not depend on her device but she is getting CRT pacing which Landry Dyke have impact on her QOL if we do not replace her device and she loses CRT. She woild like to therefor have a downgrade of her device to CRT-P, in the hopes that she will continue with the great QOL she is currently having.  We revisited the procedure, potential risks, she would like to proceed  Device information MDT CRT-D implanted 2003, RV ICD lead implanted 2006, last gen change 2014 Has a 6949 lead She has an abandoned RV pacing lead, though Dr. Caryl Comes discusses failed [phone] lead prompting need to "pirate"  her pacing lead in 2007  + appropriate tx for VT w/ATP  2019 2020 2021  Past Medical History:  Diagnosis Date  Biventricular ICD (implantable cardiac defibrillator) in place    Cellulitis    Change in bowel habits    CHF (congestive heart failure) (HCC)    Colon polyps    Diabetes mellitus    Type II   Diarrhea    Dyslipidemia    HOH (hard of hearing)    left   HTN (hypertension)    Ischemic cardiomyopathy    LBBB (left bundle branch block)    chronic   Meniere's disease    T wave oversensing 01/17/2014   Ulcerative colitis    Varicose veins    4 lazer  treatment each leg    Past Surgical History:  Procedure Laterality Date   ABDOMINAL HYSTERECTOMY     CARDIAC CATHETERIZATION  08/2011   CHOLECYSTECTOMY      COLONOSCOPY     EP Study  08/22/08   IMPLANTABLE CARDIOVERTER DEFIBRILLATOR GENERATOR CHANGE N/A 08/22/2013   Procedure: IMPLANTABLE CARDIOVERTER DEFIBRILLATOR GENERATOR CHANGE;  Surgeon: Deboraha Sprang, MD;  Location: [facility] C[facility] LAB;  Service: Cardiovascular;  Laterality: N/A;   KNEE ARTHROSCOPY Left 04/14/2016   Procedure: LEFT KNEE ARTHROSCOPY, PARTIAL MEDIAL AND PARTIAL LATERAL MENISCECTOMY, MEDIAL PLICA;  Surgeon: Dorna Leitz, MD;  Location: [place];  Service: Orthopedics;  Laterality: Left;   mastoid sugery     in the setting of Meniere's disease   TONSILLECTOMY AND ADENOIDECTOMY      Current Outpatient Medications  Medication Sig Dispense Refill   acetaminophen (TYLENOL) 500 MG tablet Take 500 mg by mouth as needed for mild pain.     aspirin EC 81 MG tablet Take 81 mg by mouth daily. Swallow whole.     atorvastatin (LIPITOR) 40 MG tablet Take 40 mg by mouth daily.  3   Biotin 5000 MCG CAPS Take 1 capsule by mouth daily. (Patient not taking: Reported on 04/29/2022)     Cholecalciferol (VITAMIN D-3) 25 MCG (1000 UT) CAPS Take 2 capsules by mouth daily.     cholestyramine (QUESTRAN) 4 g packet Take 1 packet (4 g total) by mouth daily. **patient needs office visit** 30 each 0   dicyclomine (BENTYL) 10 MG capsule TAKE ONE CAPSULE BY MOUTH THREE TIMES DAILY, 30 MINUTES BEFORE MEALS 90 capsule 1   furosemide (LASIX) 20 MG tablet Take 1 tablet (20 mg total) by mouth daily. alternating (40 mg) every other day. 60 tablet 11   HUMALOG MIX 75/25 (75-25) 100 UNIT/ML SUSP injection Inject 32 Units into the skin 2 (two) times daily with a meal.   1   Lancets (ONETOUCH DELICA PLUS [id number]) MISC CHECK BLOOD SUGAR TWICE DAILY     loperamide (IMODIUM A-D) 2 MG tablet Take 2 mg by mouth as needed for diarrhea or loose stools.     metoprolol succinate (TOPROL-XL) 100 MG 24 hr tablet Take 1 tablet by mouth 2 (two) times daily.     olmesartan (BENICAR) 40 MG tablet Take 40 mg by mouth daily.  3   ONETOUCH VERIO  test strip 2 (two) times daily.     potassium chloride (K-DUR) 10 MEQ tablet Take 1 tablet by mouth 2 (two) times daily.     traMADol (ULTRAM) 50 MG tablet Take 0.5 tablets (25 mg total) by mouth every 6 (six) hours as needed. (Patient not taking: Reported on 04/29/2022) 15 tablet 0   vitamin B-12 (CYANOCOBALAMIN) 1000 MCG tablet Take 1,000 mcg by mouth daily.     vitamin E 400 UNIT capsule Take 400 Units by mouth daily.  No current facility-administered medications for this visit.    Allergies:   Carvedilol, Digoxin, Vancomycin, and Sudafed [pseudoephedrine]   Social History:  The patient  reports that she has quit smoking. Her smoking use included cigarettes. She has never been exposed to tobacco smoke. She has never used smokeless tobacco. She reports that she does not drink alcohol and does not use drugs.   Family History:  The patient's family history includes Breast cancer in her mother; Emphysema (age of onset: 59) in her father; Healthy in her child; Heart disease in her father; Lung cancer in her mother.  ROS:  Please see the history of present illness.    All other systems are reviewed and otherwise negative.   PHYSICAL EXAM:  VS:  There were no vitals taken for this visit. BMI: There is no height or weight on file to calculate BMI. Well nourished, well developed, in no acute distress HEENT: normocephalic, atraumatic Neck: no JVD, carotid bruits or masses Cardiac:  RRR; no significant murmurs, no rubs, or gallops Lungs:  CTA b/l, no wheezing, rhonchi or rales Abd: soft, nontender MS: no deformity, age appropriate atrophyatrophy Ext: trace edema, chronic looking skin changes Skin: warm and dry, no rash Neuro:  No gross deficits appreciated Psych: euthymic mood, full affect  ICD site is stable, no tethering or discomfort   EKG:  Done today and reviewed by myself shows  AV pacing/fusing 67bpm, PVC  Device interrogation done today and reviewed by myself:  Battery  reached ERI 07/11/22 She has had some NSVTs 2 AFIB episodes, both in June, longest 22mnute 6 seconds OptiVol is great No treated or sustained VT No VS episodes   03/30/22: TTE IMPRESSION: 1. Right carotid artery system: Less than 50% stenosis secondary to moderate multifocal atherosclerotic plaque formation.   2. Left carotid artery system: 50-69% stenosis secondary to focal atherosclerotic plaque formation in the proximal to mid internal carotid artery.   3.  Vertebral artery system: Patent with antegrade flow bilaterally.   Carotid Summary April 2021:  Right Carotid: Velocities in the right ICA are consistent with a 1-39%  stenosis.   Left Carotid: Velocities in the left ICA are consistent with a 1-39%  stenosis.   Vertebrals:  Bilateral vertebral arteries demonstrate antegrade flow.  Subclavians: Normal flow hemodynamics were seen in bilateral subclavian arteries.     CT HEAD IMPRESSION 01/2020: 1. No acute intracranial abnormality. If there is persisting clinical concern for acute/subacute ischemia, MRI could be obtained. 2. Moderate parenchymal volume loss and chronic microvascular angiopathy. 3. Prior left mastoidectomy.   Electronically Signed   By: PLovena LeM.D.   On: 02/14/2020 23:51     Echo Study Conclusions from 07/2014 - Left ventricle: The cavity size was normal. Systolic function was   normal. The estimated ejection fraction was in the range of 50%   to 55%. There was an increased relative contribution of atrial   contraction to ventricular filling. Doppler parameters are   consistent with abnormal left ventricular relaxation (grade 1   diastolic dysfunction). - Ventricular septum: Septal motion showed paradox. These changes   are consistent with right ventricular pacing. - Aortic valve: There was mild regurgitation. - Mitral valve: There is a calcified mobile density in the LV   cavity most consistent with calcified redundant chordae tendinae.    This was present on echo in 2014. There was mild regurgitation. - Pulmonic valve: There was trivial regurgitation.   Recent Labs: No results found for requested labs  within last 365 days.  No results found for requested labs within last 365 days.   CrCl cannot be calculated (Patient's most recent lab result is older than the maximum 21 days allowed.).   Wt Readings from Last 3 Encounters:  04/29/22 140 lb (63.5 kg)  03/02/22 139 lb (63 kg)  02/11/22 137 lb (62.1 kg)     Other studies reviewed: Additional studies/records reviewed today include: summarized above  ASSESSMENT AND PLAN:  1. ICD     Reached ERI   Discussion as above She does not want VT/ICD therapies any longer but would like to keep CRT Plan for CRT-d downgrade to CRT-P   2. NICM     Recovered LVEF by last echo in 2015     On BB, ARB, diuretic     Well compensated     OptiVol looks great  3. HTN     Looks ok     Recheck is 156/76, better at home  4. TIA     She has had 2 short Afib episodes, longest 71mnutes, 6 seconds and previously seen     We had previously discussed with the patient and her son     No a/c for now, given very short and very rare episodes     monitor via her device  5. VT     + MMVT treated by ATP  in the past, asymptomatic     Discussion as above     Disposition: usual post procedure f/u     Current medicines are reviewed at length with the patient today.  The patient did not have any concerns regarding medicines.  SVenetia Night PA-C 08/01/2022 11:51 AM     [place] [phone](office)  [phone](fax)

## 2022-08-01 NOTE — Progress Notes (Unsigned)
Cardiology Office Note Date:  08/01/2022  Patient ID:  Arami, Ridinger 1926/10/19, MRN [id number] PCP:  Creola Corn, MD  Cardiologist:  Dr. Greggory Brandy. Tyrone Sage, NP (retired) Electrophysiologist: Dr. Graciela Husbands    Chief Complaint:  *** ERI  History of Present Illness: Grace Robinson is a 88 y.o. female with history of NICM, VT, chronic CHF (systolic), DM, HTN, HLD, LBBB, CRT-D, ulcerative colitis, Meniere's disease, suspect TIA  Recovered LVEF by echo 2015  Resides at [facility]  She comes in today to be seen for Dr. Graciela Husbands, last seen by him via telehealth 07/2020, at that time, mentions hx of having had ATP in 2019 and 2020.  No changes were made, she was doing well  She saw L. Tyrone Sage, NP in Jan 2022, notes back in April 2021, has transient symptoms of R hand weakness, CT and carotids were OK, she declined referral to neurology At this visit in Jan she was accompanied by her son, maintained on full dose ASA after TIA-like event. Dependent edema noted variocities, weight was stable, support stockings helped.   I saw her May 2022, having been lost to EP in-clinic follow up for some time. This is the 1st visit in  Clinic with EP for a couple years. She is accompanied by her son. She feels like she is doing well. No cardiac awareness, no CP, palpitations Denies rest SOB, minimal DOE with longer walking. No near syncope or syncope. Chronic balance issues, denies falls Her PMD does her labs, lipids She had some NSVTs and SVTs An appropriate ATP tx for MMVT She also had a couple short AF episodes, in d/w pt/son, no a/c yet.  She saw Dr. Graciela Husbands April 2023, looks like they discussed her device nearing ERI and all the potential options, at the time of her visit then, she preferred not to pursue any device change and when her life ends/heart stops, she was at peace with dying. He also discussed noted nystagmus and planned to reach out to Drs Tat and Timothy Lasso for their thoughts/?neuro  eval.  She is followed by [facility] RN as well. Pending new home monitor  07/19/22 RN notes that ERI had been triggered, pt voiced that she had reconsidered and would like her device replaced.  *** procedure *** appropriate therapies... ICD *** symptoms *** volume *** labs, lytes *** meds, CM *** AFib ?  Device information MDT CRT-D implanted 2003, RV ICD lead implanted 2006, last gen change 2014 Has a 6949 lead She has an abandoned RV pacing lead, though Dr. Graciela Husbands discusses failed [phone] lead prompting need to "pirate"  her pacing lead in 2007  + appropriate tx for VT w/ATP  2019 2020 2021  Past Medical History:  Diagnosis Date   Biventricular ICD (implantable cardiac defibrillator) in place    Cellulitis    Change in bowel habits    CHF (congestive heart failure) (HCC)    Colon polyps    Diabetes mellitus    Type II   Diarrhea    Dyslipidemia    HOH (hard of hearing)    left   HTN (hypertension)    Ischemic cardiomyopathy    LBBB (left bundle branch block)    chronic   Meniere's disease    T wave oversensing 01/17/2014   Ulcerative colitis    Varicose veins    4 lazer  treatment each leg    Past Surgical History:  Procedure Laterality Date   ABDOMINAL HYSTERECTOMY     CARDIAC  CATHETERIZATION  08/2011   CHOLECYSTECTOMY     COLONOSCOPY     EP Study  08/22/08   IMPLANTABLE CARDIOVERTER DEFIBRILLATOR GENERATOR CHANGE N/A 08/22/2013   Procedure: IMPLANTABLE CARDIOVERTER DEFIBRILLATOR GENERATOR CHANGE;  Surgeon: Duke Salvia, MD;  Location: [facility] CATH LAB;  Service: Cardiovascular;  Laterality: N/A;   KNEE ARTHROSCOPY Left 04/14/2016   Procedure: LEFT KNEE ARTHROSCOPY, PARTIAL MEDIAL AND PARTIAL LATERAL MENISCECTOMY, MEDIAL PLICA;  Surgeon: Jodi Geralds, MD;  Location: [facility] OR;  Service: Orthopedics;  Laterality: Left;   mastoid sugery     in the setting of Meniere's disease   TONSILLECTOMY AND ADENOIDECTOMY      Current Outpatient Medications  Medication Sig Dispense  Refill   acetaminophen (TYLENOL) 500 MG tablet Take 500 mg by mouth as needed for mild pain.     aspirin EC 81 MG tablet Take 81 mg by mouth daily. Swallow whole.     atorvastatin (LIPITOR) 40 MG tablet Take 40 mg by mouth daily.  3   Biotin 5000 MCG CAPS Take 1 capsule by mouth daily. (Patient not taking: Reported on 04/29/2022)     Cholecalciferol (VITAMIN D-3) 25 MCG (1000 UT) CAPS Take 2 capsules by mouth daily.     cholestyramine (QUESTRAN) 4 g packet Take 1 packet (4 g total) by mouth daily. **patient needs office visit** 30 each 0   dicyclomine (BENTYL) 10 MG capsule TAKE ONE CAPSULE BY MOUTH THREE TIMES DAILY, 30 MINUTES BEFORE MEALS 90 capsule 1   furosemide (LASIX) 20 MG tablet Take 1 tablet (20 mg total) by mouth daily. alternating (40 mg) every other day. 60 tablet 11   HUMALOG MIX 75/25 (75-25) 100 UNIT/ML SUSP injection Inject 32 Units into the skin 2 (two) times daily with a meal.   1   Lancets (ONETOUCH DELICA PLUS [id number]) MISC CHECK BLOOD SUGAR TWICE DAILY     loperamide (IMODIUM A-D) 2 MG tablet Take 2 mg by mouth as needed for diarrhea or loose stools.     metoprolol succinate (TOPROL-XL) 100 MG 24 hr tablet Take 1 tablet by mouth 2 (two) times daily.     olmesartan (BENICAR) 40 MG tablet Take 40 mg by mouth daily.  3   ONETOUCH VERIO test strip 2 (two) times daily.     potassium chloride (K-DUR) 10 MEQ tablet Take 1 tablet by mouth 2 (two) times daily.     traMADol (ULTRAM) 50 MG tablet Take 0.5 tablets (25 mg total) by mouth every 6 (six) hours as needed. (Patient not taking: Reported on 04/29/2022) 15 tablet 0   vitamin B-12 (CYANOCOBALAMIN) 1000 MCG tablet Take 1,000 mcg by mouth daily.     vitamin E 400 UNIT capsule Take 400 Units by mouth daily.     No current facility-administered medications for this visit.    Allergies:   Carvedilol, Digoxin, Vancomycin, and Sudafed [pseudoephedrine]   Social History:  The patient  reports that she has quit smoking. Her smoking  use included cigarettes. She has never been exposed to tobacco smoke. She has never used smokeless tobacco. She reports that she does not drink alcohol and does not use drugs.   Family History:  The patient's family history includes Breast cancer in her mother; Emphysema (age of onset: 1) in her father; Healthy in her child; Heart disease in her father; Lung cancer in her mother.  ROS:  Please see the history of present illness.    All other systems are reviewed and otherwise negative.   PHYSICAL  EXAM:  VS:  There were no vitals taken for this visit. BMI: There is no height or weight on file to calculate BMI. Well nourished, well developed, in no acute distress HEENT: normocephalic, atraumatic Neck: no JVD, carotid bruits or masses Cardiac:  *** RRR; no significant murmurs, no rubs, or gallops Lungs:  *** CTA b/l, no wheezing, rhonchi or rales Abd: soft, nontender MS: no deformity, age appropriate atrophyatrophy Ext: *** trace edema, chronic looking skin changes Skin: warm and dry, no rash Neuro:  No gross deficits appreciated Psych: euthymic mood, full affect  *** ICD site is stable, no tethering or discomfort   EKG:  Done today and reviewed by myself shows  ***  Device interrogation done today and reviewed by myself:  ***   03/30/22: TTE IMPRESSION: 1. Right carotid artery system: Less than 50% stenosis secondary to moderate multifocal atherosclerotic plaque formation.   2. Left carotid artery system: 50-69% stenosis secondary to focal atherosclerotic plaque formation in the proximal to mid internal carotid artery.   3.  Vertebral artery system: Patent with antegrade flow bilaterally.   Carotid Summary April 2021:  Right Carotid: Velocities in the right ICA are consistent with a 1-39%  stenosis.   Left Carotid: Velocities in the left ICA are consistent with a 1-39%  stenosis.   Vertebrals:  Bilateral vertebral arteries demonstrate antegrade flow.  Subclavians:  Normal flow hemodynamics were seen in bilateral subclavian arteries.     CT HEAD IMPRESSION 01/2020: 1. No acute intracranial abnormality. If there is persisting clinical concern for acute/subacute ischemia, MRI could be obtained. 2. Moderate parenchymal volume loss and chronic microvascular angiopathy. 3. Prior left mastoidectomy.   Electronically Signed   By: Kreg Shropshire M.D.   On: 02/14/2020 23:51     Echo Study Conclusions from 07/2014 - Left ventricle: The cavity size was normal. Systolic function was   normal. The estimated ejection fraction was in the range of 50%   to 55%. There was an increased relative contribution of atrial   contraction to ventricular filling. Doppler parameters are   consistent with abnormal left ventricular relaxation (grade 1   diastolic dysfunction). - Ventricular septum: Septal motion showed paradox. These changes   are consistent with right ventricular pacing. - Aortic valve: There was mild regurgitation. - Mitral valve: There is a calcified mobile density in the LV   cavity most consistent with calcified redundant chordae tendinae.   This was present on echo in 2014. There was mild regurgitation. - Pulmonic valve: There was trivial regurgitation.   Recent Labs: No results found for requested labs within last 365 days.  No results found for requested labs within last 365 days.   CrCl cannot be calculated (Patient's most recent lab result is older than the maximum 21 days allowed.).   Wt Readings from Last 3 Encounters:  04/29/22 140 lb (63.5 kg)  03/02/22 139 lb (63 kg)  02/11/22 137 lb (62.1 kg)     Other studies reviewed: Additional studies/records reviewed today include: summarized above  ASSESSMENT AND PLAN:  1. ICD     ***      Reached ERI ***   2. NICM     *** Recovered LVEF by last echo in 2015     *** On BB, ARB, diuretic  3. HTN     ***  4. TIA     She has had 2 short Afib episodes, long 1.47minutes.     ***  Discussed with the patient and her  son today, none previously     *** No a/c for now, monitor via her device  5. VT     + MMVT treated by ATP  in the past, asymptomatic     ***     Disposition: ***     Current medicines are reviewed at length with the patient today.  The patient did not have any concerns regarding medicines.  Norma Fredrickson, PA-C 08/01/2022 11:51 AM     [facility] [address] [place] [address] [phone] (office)  [phone] (fax)

## 2022-08-02 ENCOUNTER — Encounter: Payer: Self-pay | Admitting: *Deleted

## 2022-08-02 ENCOUNTER — Ambulatory Visit (INDEPENDENT_AMBULATORY_CARE_PROVIDER_SITE_OTHER): Payer: Medicare Other

## 2022-08-02 ENCOUNTER — Ambulatory Visit: Payer: Medicare Other | Attending: Physician Assistant | Admitting: Physician Assistant

## 2022-08-02 VITALS — BP 164/82 | HR 84 | Ht 61.5 in | Wt 147.0 lb

## 2022-08-02 DIAGNOSIS — Z95810 Presence of automatic (implantable) cardiac defibrillator: Secondary | ICD-10-CM | POA: Diagnosis not present

## 2022-08-02 DIAGNOSIS — I5022 Chronic systolic (congestive) heart failure: Secondary | ICD-10-CM | POA: Diagnosis not present

## 2022-08-02 DIAGNOSIS — I10 Essential (primary) hypertension: Secondary | ICD-10-CM

## 2022-08-02 DIAGNOSIS — Z01818 Encounter for other preprocedural examination: Secondary | ICD-10-CM | POA: Diagnosis not present

## 2022-08-02 DIAGNOSIS — I472 Ventricular tachycardia, unspecified: Secondary | ICD-10-CM | POA: Diagnosis not present

## 2022-08-02 DIAGNOSIS — I428 Other cardiomyopathies: Secondary | ICD-10-CM | POA: Diagnosis not present

## 2022-08-02 DIAGNOSIS — Z4502 Encounter for adjustment and management of automatic implantable cardiac defibrillator: Secondary | ICD-10-CM

## 2022-08-02 LAB — CUP PACEART INCLINIC DEVICE CHECK
Battery Remaining Longevity: 1 mo — CL
Battery Voltage: 2.67 V
Brady Statistic AP VP Percent: 43.56 %
Brady Statistic AP VS Percent: 0.66 %
Brady Statistic AS VP Percent: 54.89 %
Brady Statistic AS VS Percent: 0.89 %
Brady Statistic RA Percent Paced: 43.67 %
Brady Statistic RV Percent Paced: 0.23 %
Date Time Interrogation Session: 20231002180141
HighPow Impedance: 53 Ohm
HighPow Impedance: 76 Ohm
Implantable Lead Implant Date: 20031007
Implantable Lead Implant Date: 20031007
Implantable Lead Implant Date: 20031007
Implantable Lead Implant Date: 20060227
Implantable Lead Location: 753858
Implantable Lead Location: 753859
Implantable Lead Location: 753860
Implantable Lead Location: 753860
Implantable Lead Model: 4193
Implantable Lead Model: 4469
Implantable Lead Model: 4470
Implantable Lead Model: 6949
Implantable Lead Serial Number: 337988
Implantable Lead Serial Number: 361236
Implantable Pulse Generator Implant Date: 20141022
Lead Channel Impedance Value: 342 Ohm
Lead Channel Impedance Value: 4047 Ohm
Lead Channel Impedance Value: 4047 Ohm
Lead Channel Impedance Value: 456 Ohm
Lead Channel Impedance Value: 475 Ohm
Lead Channel Impedance Value: 551 Ohm
Lead Channel Pacing Threshold Amplitude: 0.625 V
Lead Channel Pacing Threshold Amplitude: 1.125 V
Lead Channel Pacing Threshold Amplitude: 1.5 V
Lead Channel Pacing Threshold Pulse Width: 0.4 ms
Lead Channel Pacing Threshold Pulse Width: 0.4 ms
Lead Channel Pacing Threshold Pulse Width: 0.4 ms
Lead Channel Sensing Intrinsic Amplitude: 1.625 mV
Lead Channel Sensing Intrinsic Amplitude: 1.875 mV
Lead Channel Sensing Intrinsic Amplitude: 28.625 mV
Lead Channel Sensing Intrinsic Amplitude: 31.625 mV
Lead Channel Setting Pacing Amplitude: 2 V
Lead Channel Setting Pacing Amplitude: 2.25 V
Lead Channel Setting Pacing Amplitude: 3.5 V
Lead Channel Setting Pacing Pulse Width: 0.4 ms
Lead Channel Setting Pacing Pulse Width: 0.4 ms
Lead Channel Setting Sensing Sensitivity: 0.6 mV

## 2022-08-02 NOTE — Patient Instructions (Signed)
Medication Instructions:   Your physician recommends that you continue on your current medications as directed. Please refer to the Current Medication list given to you today.   *If you need a refill on your cardiac medications before your next appointment, please call your pharmacy*   Lab Work: [place]    If you have labs (blood work) drawn today and your tests are completely normal, you will receive your results only by: [place] (if you have MyChart) OR A paper copy in the mail If you have any lab test that is abnormal or we need to change your treatment, we will call you to review the results.   Testing/Procedures: SEE LETTER FOR GEN CHANGE ON 08-30-22    Follow-Up: At [facility], you and your health needs are our priority.  As part of our continuing mission to provide you with exceptional heart care, we have created designated Provider Care Teams.  These Care Teams include your primary Cardiologist (physician) and Advanced Practice Providers (APPs -  Physician Assistants and Nurse Practitioners) who all work together to provide you with the care you need, when you need it.  We recommend signing up for the patient portal called "MyChart".  Sign up information is provided on this After Visit Summary.  MyChart is used to connect with patients for Virtual Visits (Telemedicine).  Patients are able to view lab/test results, encounter notes, upcoming appointments, etc.  Non-urgent messages can be sent to your provider as well.   To learn more about what you can do with MyChart, go to [email or web address].    Your next appointment:  AFTER 08-30-22 10-14 DAYS WOUND  CHECK WITH DEVICE   3 month(s)  The format for your next appointment:   In Person  Provider:   Virl Axe, MD  [facility] PACER [facility]    Other Instructions    Important Information About Sugar

## 2022-08-03 LAB — BASIC METABOLIC PANEL
BUN/Creatinine Ratio: 17 (ref 12–28)
BUN: 15 mg/dL (ref 10–36)
CO2: 26 mmol/L (ref 20–29)
Calcium: 9.2 mg/dL (ref 8.7–10.3)
Chloride: 100 mmol/L (ref 96–106)
Creatinine, Ser: 0.89 mg/dL (ref 0.57–1.00)
Glucose: 124 mg/dL — ABNORMAL HIGH (ref 70–99)
Potassium: 3.8 mmol/L (ref 3.5–5.2)
Sodium: 144 mmol/L (ref 134–144)
eGFR: 60 mL/min/{1.73_m2} (ref >59)

## 2022-08-03 LAB — CBC
Hematocrit: 40.3 % (ref 34.0–46.6)
Hemoglobin: 13.8 g/dL (ref 11.1–15.9)
MCH: 32.5 pg (ref 26.6–33.0)
MCHC: 34.2 g/dL (ref 31.5–35.7)
MCV: 95 fL (ref 79–97)
Platelets: 145 10*3/uL — ABNORMAL LOW (ref 150–450)
RBC: 4.24 x10E6/uL (ref 3.77–5.28)
RDW: 12.6 % (ref 11.7–15.4)
WBC: 7.2 10*3/uL (ref 3.4–10.8)

## 2022-08-03 NOTE — Progress Notes (Signed)
EPIC Encounter for ICM Monitoring  Patient Name: Grace Robinson is a 88 y.o. female Date: 08/03/2022 Primary Care Physican: Shon Baton, MD Primary Cardiologist: Caryl Comes Electrophysiologist: Vergie Living Pacing:  96.4%     Last Office Weight: 141 lbs   Time in AT/AF  (0.0%)   Battery Longevity:  Replacement scheduled 10/30           Spoke with patient and heart failure questions reviewed.  Pt asymptomatic for fluid accumulation. She will have a pacemaker implant instead of defibrillator on 10/30.     Optivol Thoracic impedance suggesting normal fluid levels.      Prescribed:  Furosemide 20 mg take 1 tablet (20 mg total) by mouth daily alternating (40 mg) every other day.   Potassium 10 mEq 1 tablet twice a day.   Labs: 08/02/2022 Creatinine 0.89, BUN 15, Potassium 3.8, Sodium 144, GFR 60 A complete set of results can be found in Results Review.   Recommendations:  No changes and encouraged to call if experiencing any fluid symptoms.   Follow-up plan: ICM clinic phone appointment on 10/04/2022 (following replacement).   91 day device clinic remote transmission pending new replacement.      EP/Cardiology Office Visits:  12/07/2022 with Dr Caryl Comes.     Copy of ICM check sent to Dr. Caryl Comes.      3 month ICM trend: 08/03/2022.    12-14 Month ICM trend:     [name], RN 08/03/2022 1:24 PM

## 2022-08-09 ENCOUNTER — Ambulatory Visit (INDEPENDENT_AMBULATORY_CARE_PROVIDER_SITE_OTHER): Payer: Medicare Other

## 2022-08-09 DIAGNOSIS — I5022 Chronic systolic (congestive) heart failure: Secondary | ICD-10-CM

## 2022-08-09 DIAGNOSIS — I428 Other cardiomyopathies: Secondary | ICD-10-CM

## 2022-08-10 LAB — CUP PACEART REMOTE DEVICE CHECK
Battery Remaining Longevity: 1 mo — CL
Battery Voltage: 2.67 V
Brady Statistic AP VP Percent: 48.36 %
Brady Statistic AP VS Percent: 0.75 %
Brady Statistic AS VP Percent: 50.09 %
Brady Statistic AS VS Percent: 0.81 %
Brady Statistic RA Percent Paced: 48.93 %
Brady Statistic RV Percent Paced: 0.21 %
Date Time Interrogation Session: 20231009012402
HighPow Impedance: 49 Ohm
HighPow Impedance: 69 Ohm
Implantable Lead Implant Date: 20031007
Implantable Lead Implant Date: 20031007
Implantable Lead Implant Date: 20031007
Implantable Lead Implant Date: 20060227
Implantable Lead Location: 753858
Implantable Lead Location: 753859
Implantable Lead Location: 753860
Implantable Lead Location: 753860
Implantable Lead Model: 4193
Implantable Lead Model: 4469
Implantable Lead Model: 4470
Implantable Lead Model: 6949
Implantable Lead Serial Number: 337988
Implantable Lead Serial Number: 361236
Implantable Pulse Generator Implant Date: 20141022
Lead Channel Impedance Value: 304 Ohm
Lead Channel Impedance Value: 4047 Ohm
Lead Channel Impedance Value: 4047 Ohm
Lead Channel Impedance Value: 456 Ohm
Lead Channel Impedance Value: 475 Ohm
Lead Channel Impedance Value: 513 Ohm
Lead Channel Pacing Threshold Amplitude: 0.75 V
Lead Channel Pacing Threshold Amplitude: 0.875 V
Lead Channel Pacing Threshold Amplitude: 1.5 V
Lead Channel Pacing Threshold Pulse Width: 0.4 ms
Lead Channel Pacing Threshold Pulse Width: 0.4 ms
Lead Channel Pacing Threshold Pulse Width: 0.4 ms
Lead Channel Sensing Intrinsic Amplitude: 1.125 mV
Lead Channel Sensing Intrinsic Amplitude: 1.125 mV
Lead Channel Sensing Intrinsic Amplitude: 26.875 mV
Lead Channel Sensing Intrinsic Amplitude: 26.875 mV
Lead Channel Setting Pacing Amplitude: 2 V
Lead Channel Setting Pacing Amplitude: 2 V
Lead Channel Setting Pacing Amplitude: 3.5 V
Lead Channel Setting Pacing Pulse Width: 0.4 ms
Lead Channel Setting Pacing Pulse Width: 0.4 ms
Lead Channel Setting Sensing Sensitivity: 0.6 mV

## 2022-08-12 ENCOUNTER — Ambulatory Visit: Payer: Medicare Other | Admitting: Physician Assistant

## 2022-08-12 ENCOUNTER — Encounter: Payer: Self-pay | Admitting: Physician Assistant

## 2022-08-12 ENCOUNTER — Ambulatory Visit: Payer: Medicare Other | Admitting: Neurology

## 2022-08-12 VITALS — BP 150/80 | HR 72 | Ht 65.5 in | Wt 137.0 lb

## 2022-08-12 DIAGNOSIS — K580 Irritable bowel syndrome with diarrhea: Secondary | ICD-10-CM

## 2022-08-12 MED ORDER — DICYCLOMINE HCL 10 MG PO CAPS: ORAL_CAPSULE | ORAL | 2 refills | Status: DC

## 2022-08-12 MED ORDER — CHOLESTYRAMINE 4 G PO PACK: 4.0000 g | PACK | Freq: Every day | ORAL | 2 refills | Status: DC

## 2022-08-12 NOTE — Progress Notes (Signed)
Subjective:    Patient ID: Grace Robinson, female    DOB: 09-16-1926, 88 y.o.   MRN: [id number]  HPI [name] is a delightful 88 year old white female, established with Dr. Silverio Decamp and also known to myself who comes in today for routine follow-up and medication refills.  She was last seen in August 2022.  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 Other medical problems include insulin-dependent diabetes mellitus, nonischemic dilated cardiomyopathy, hypertension and congestive heart failure. She mentions that she is scheduled for upcoming removal of her ICD and placement of a pacemaker later this month. He has been doing well over the past year, but says she has been having some increase in gas and bloating postprandially and asks what she can take for this.  She is currently using dicyclomine 10 mg 3 times daily AC and takes Questran 4 g after dinner.  She has used Gas-X in the past with some success. On further discussion she has been drinking 1 diet soda daily usually at lunchtime, and does use some artificial sweeteners. She says occasionally she will still have episodes of diarrhea and is not sure what triggers these these may occur once or twice per month and may last for a day or 2 at which point she may have up to 5 bowel movements per day.  In between these episodes she goes back to her normal bowel habits and feels fine.  Review of Systems. Pertinent positive and negative review of systems were noted in the above HPI section.  All other review of systems was otherwise negative.   Outpatient Encounter Medications as of 08/12/2022  Medication Sig   acetaminophen (TYLENOL) 500 MG tablet Take 500 mg by mouth as needed for mild pain.   aspirin EC 81 MG tablet Take 81 mg by mouth daily. Swallow whole.   atorvastatin (LIPITOR) 40 MG tablet Take 40 mg by mouth daily.   Cholecalciferol (VITAMIN D-3) 25 MCG (1000 UT) CAPS Take 2 capsules by mouth daily.    furosemide (LASIX) 20 MG tablet Take 1 tablet (20 mg total) by mouth daily. alternating (40 mg) every other day.   HUMALOG MIX 75/25 (75-25) 100 UNIT/ML SUSP injection Inject 32 Units into the skin 2 (two) times daily with a meal.    Lancets (ONETOUCH DELICA PLUS [id number]) MISC CHECK BLOOD SUGAR TWICE DAILY   loperamide (IMODIUM A-D) 2 MG tablet Take 2 mg by mouth as needed for diarrhea or loose stools.   metoprolol succinate (TOPROL-XL) 100 MG 24 hr tablet Take 1 tablet by mouth 2 (two) times daily.   olmesartan (BENICAR) 40 MG tablet Take 40 mg by mouth daily.   ONETOUCH VERIO test strip 2 (two) times daily.   potassium chloride (K-DUR) 10 MEQ tablet Take 1 tablet by mouth 2 (two) times daily.   vitamin B-12 (CYANOCOBALAMIN) 1000 MCG tablet Take 1,000 mcg by mouth daily.   vitamin E 400 UNIT capsule Take 400 Units by mouth daily.   [DISCONTINUED] cholestyramine (QUESTRAN) 4 g packet Take 1 packet (4 g total) by mouth daily. **patient needs office visit**   [DISCONTINUED] dicyclomine (BENTYL) 10 MG capsule TAKE ONE CAPSULE BY MOUTH THREE TIMES DAILY, 30 MINUTES BEFORE MEALS   cholestyramine (QUESTRAN) 4 g packet Take 1 packet (4 g total) by mouth daily.   dicyclomine (BENTYL) 10 MG capsule TAKE ONE CAPSULE BY MOUTH THREE TIMES DAILY, 30 MINUTES BEFORE MEALS   [DISCONTINUED] Biotin 5000 MCG CAPS Take 1 capsule  by mouth daily. (Patient not taking: Reported on 04/29/2022)   [DISCONTINUED] traMADol (ULTRAM) 50 MG tablet Take 0.5 tablets (25 mg total) by mouth every 6 (six) hours as needed. (Patient not taking: Reported on 04/29/2022)   No facility-administered encounter medications on file as of 08/12/2022.   Allergies  Allergen Reactions   Carvedilol Other (See Comments)   Digoxin     Other reaction(s): dig toxicity   Vancomycin Other (See Comments)   Sudafed [Pseudoephedrine] Other (See Comments)    Makes skin feel like its crawling   Patient Active Problem List   Diagnosis Date Noted    NICM (nonischemic cardiomyopathy) ([place]) 07/30/2021   Degenerative tear of posterior horn of medial meniscus of left knee 04/14/2016   Tear of lateral meniscus of left knee 62/22/9798   Synovial plica of left knee 92/09/9416   T wave oversensing 01/17/2014   Fall 07/10/2013   Paroxysmal ventricular tachycardia (HCC) 12/13/2012   Biventricular implantable cardioverter-defibrillator -Medtronic 12/08/2011   Atrial tachycardia 12/08/2011   HTN (hypertension) 05/19/2011   CARDIOMYOPATHY, PRIMARY, DILATED 40/81/4481   SYSTOLIC HEART FAILURE, CHRONIC 12/01/2010   Social History   Socioeconomic History   Marital status: Single    Spouse name: Not on file   Number of children: 2   Years of education: Not on file   Highest education level: Bachelor's degree (e.g., BA, AB, BS)  Occupational History   Occupation: retired    Comment: Pharmacist, 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    Social Determinants of Health   Financial Resource Strain: Not on file  Food Insecurity: Not on file  Transportation Needs: Not on file  Physical Activity: Not on file  Stress: Not on file  Social Connections: Not on file  Intimate Partner Violence: Not on file    Ms. Boorman's family history includes Breast cancer in her mother; Emphysema (age of onset: 78) in her father; Healthy in her child; Heart disease in her father; Lung cancer in her mother.      Objective:    Vitals:   08/12/22 1355  BP: (!) 150/80  Pulse: 72  SpO2: 92%    Physical Exam Well-developed well-nourished elderly  white female in no acute distress.  Very pleasant, accompanied by family member height, Weight, 137 BMI 22.4  HEENT; nontraumatic normocephalic, EOMI, PE R LA, sclera anicteric. Oropharynx; not examined today Neck; supple, no JVD Cardiovascular; regular rate and rhythm with S1-S2, no murmur rub or gallop Pulmonary; Clear bilaterally Abdomen; soft, nontender, nondistended, no palpable mass or hepatosplenomegaly, bowel sounds are active Rectal; not done today Skin; benign exam, no jaundice rash or appreciable lesions Extremities; no clubbing cyanosis or edema skin warm and dry Neuro/Psych; alert and oriented x4, grossly nonfocal mood and affect appropriate        Assessment & Plan:   #33 88 year old white female with long history of IBS-D/probable bile salt induced colopathy overlap here for routine follow-up Stable on dicyclomine 3 times daily AC,  and Questran 4 g daily every afternoon/after dinner  Some recent increase in bloating and gas  #2 status postcholecystectomy #3.  Insulin-dependent diabetes mellitus #4.  Congestive heart failure/nonischemic dilated cardiomyopathy #5.  Hypertension  Plan; refill dicyclomine 10 mg 3 times daily AC x1 year Refill Questran 4 g once daily each evening after dinner x1 year We discussed a low gas diet, she was given a copy of the low gas diet and have also asked her to stop drinking sodas, and discontinue use of artificial sweeteners other than Stevia as both of these may be contributing to her gas and bloating  She can use Gas-X 1-2 with each meal and/or Phazyme 125 1-2 with each meal. I have asked her to try this over the next 3 to 4 weeks and call back if she does not have any improvement in symptoms. Follow-up in 1 year or sooner as needed   Alfredia Ferguson PA-C 08/12/2022   Cc: Shon Baton, MD

## 2022-08-12 NOTE — Patient Instructions (Signed)
Try over the counter Gas- X or Phazyme 25 mg, take 1 by mouth before each meal.   We have sent the following medications to your pharmacy for you to pick up at your convenience: Bentyl, Questran   Please see Gas and Flatulence Prevention Diet handout.   _______________________________________________________  If you are age 86 or older, your body mass index should be between 23-30. Your Body mass index is 22.46 kg/m. If this is out of the aforementioned range listed, please consider follow up with your Primary Care Provider.  If you are age 32 or younger, your body mass index should be between 19-25. Your Body mass index is 22.46 kg/m. If this is out of the aformentioned range listed, please consider follow up with your Primary Care Provider.   ________________________________________________________  The [place] [facility] providers would like to encourage you to use [facility] to communicate with providers for non-urgent requests or questions.  Due to long hold times on the telephone, sending your provider a message by [facility] may be a faster and more efficient way to get a response.  Please allow 48 business hours for a response.  Please remember that this is for non-urgent requests.  _______________________________________________________  Nicoletta Ba- PA

## 2022-08-13 IMAGING — US US CAROTID DUPLEX BILAT
1 series · 13 of 24 positions shown · non-contrast
Comparison: None Available.

CLINICAL DATA: [AGE] female with history of ataxia.

EXAM:
BILATERAL CAROTID DUPLEX ULTRASOUND
TECHNIQUE: Gray scale imaging, color Doppler and duplex ultrasound were
performed of bilateral carotid and vertebral arteries in the neck.

[Series 1: us carotid duplex bilat · 0.07mm/px · 56 acquisitions, 13 frames shown]
[im 1/56]
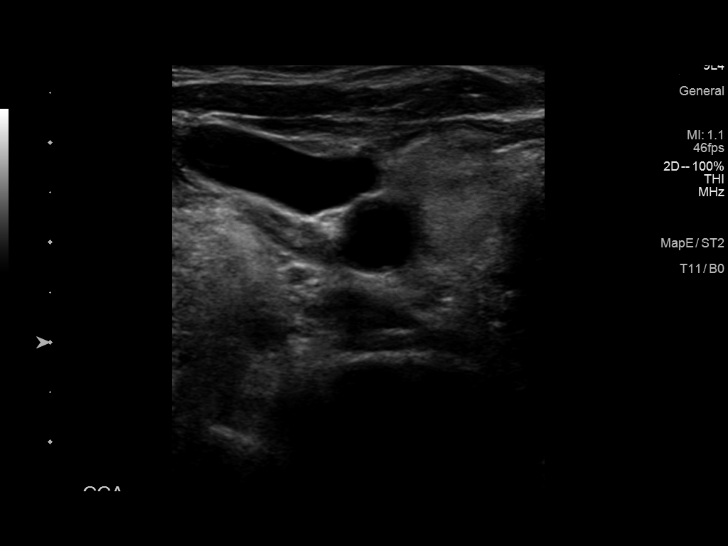
[im 5/56]
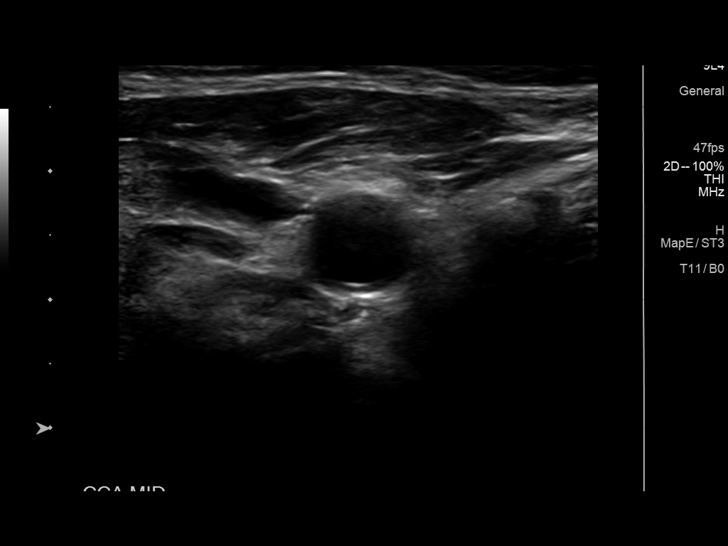
[im 10/56]
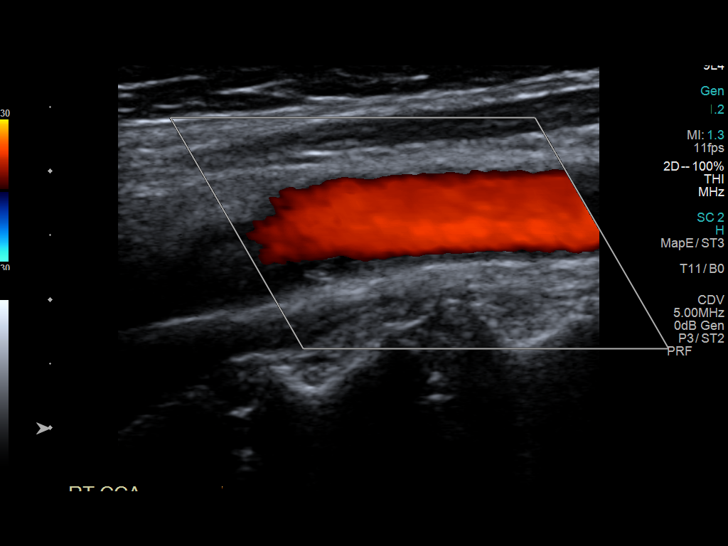
[im 15/56]
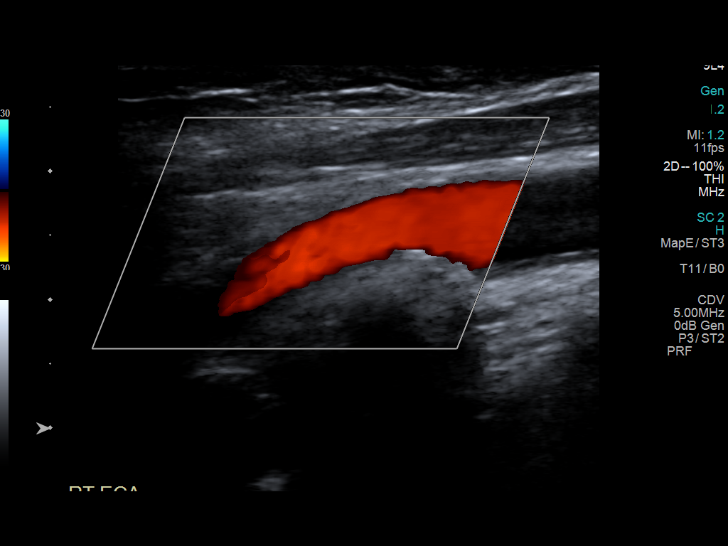
[im 20/56]
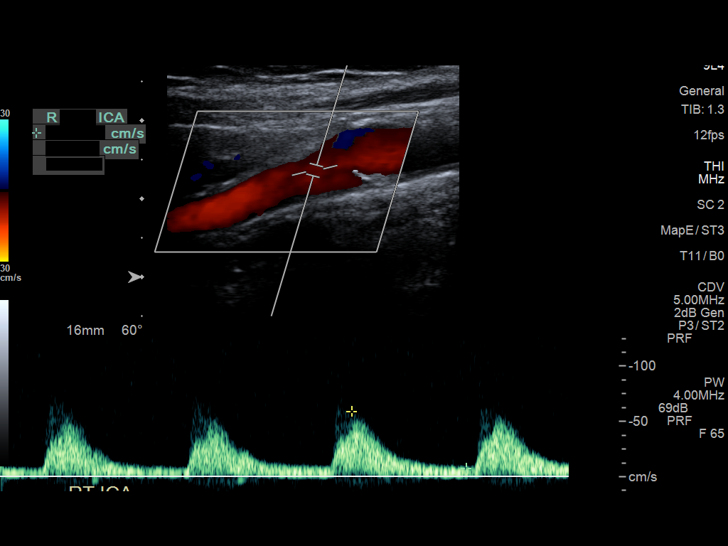
[im 24/56]
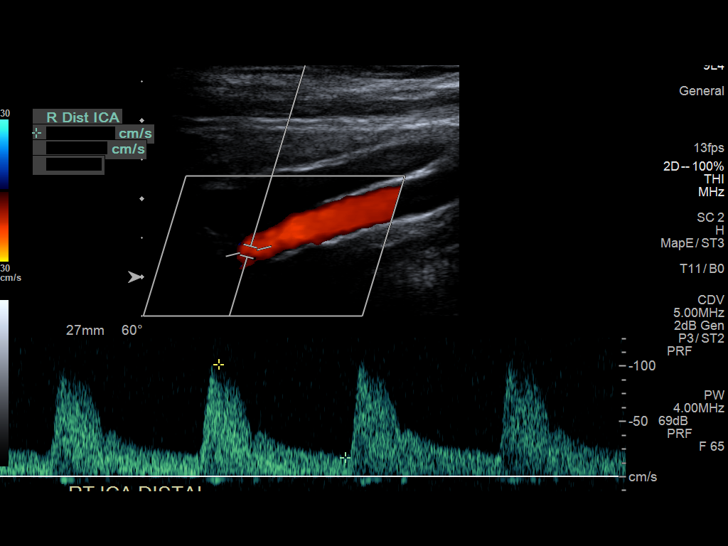
[im 32/56]
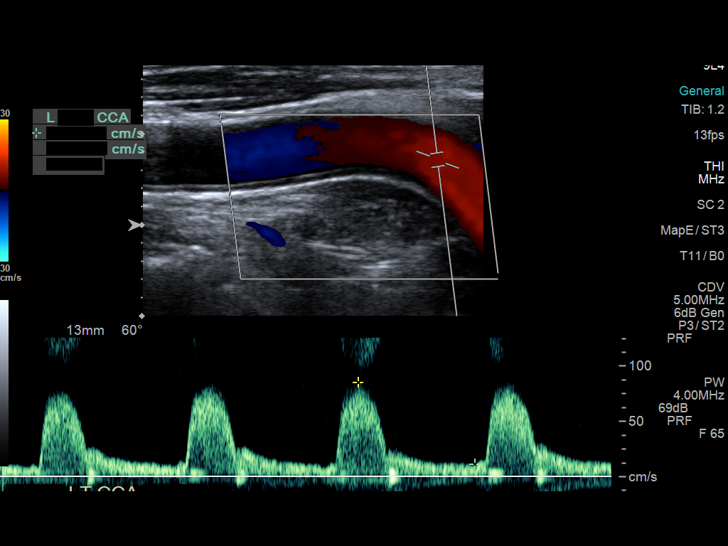
[im 34/56]
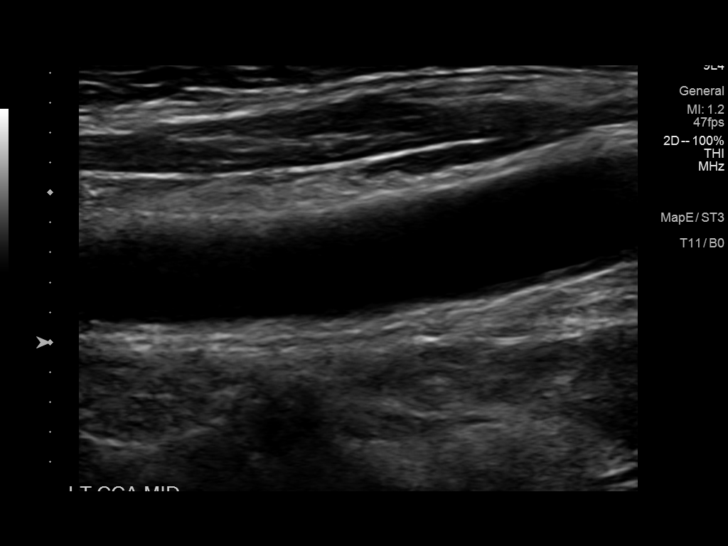
[im 39/56]
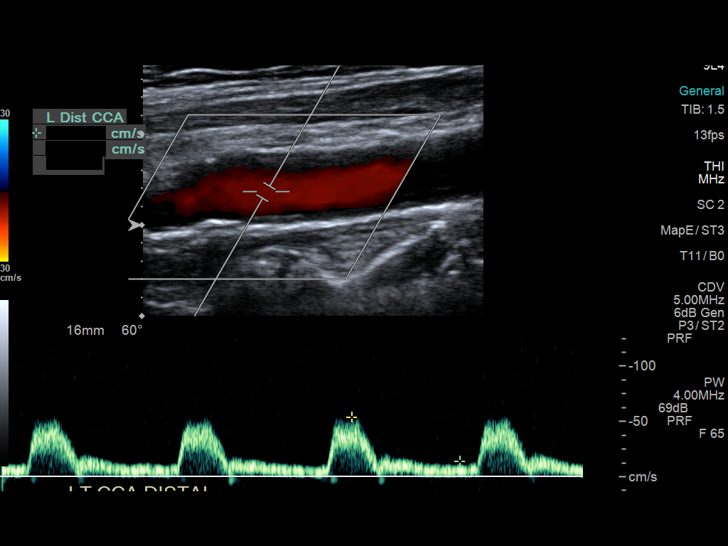
[im 44/56]
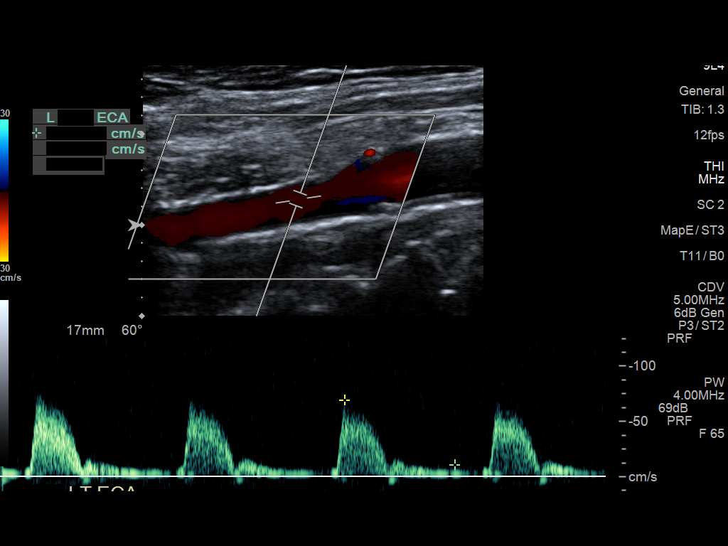
[im 48/56]
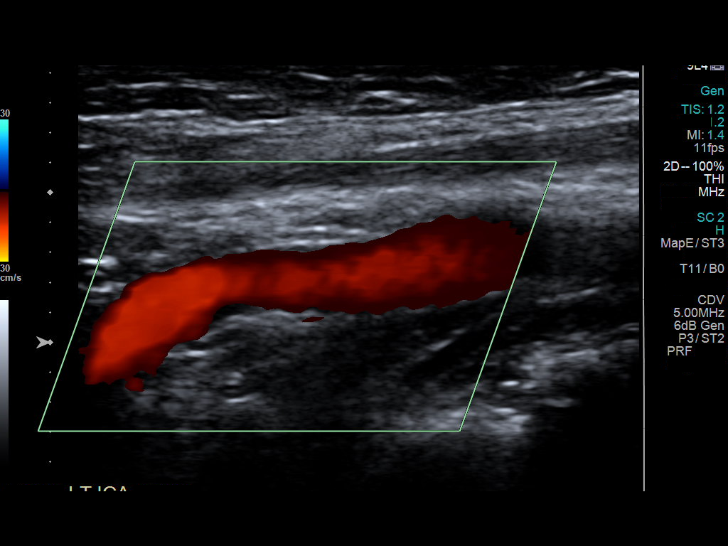
[im 51/56]
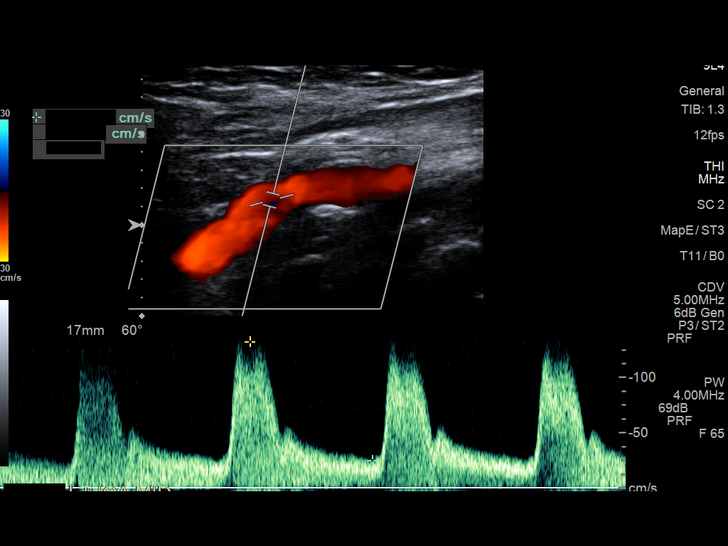
[im 56/56]
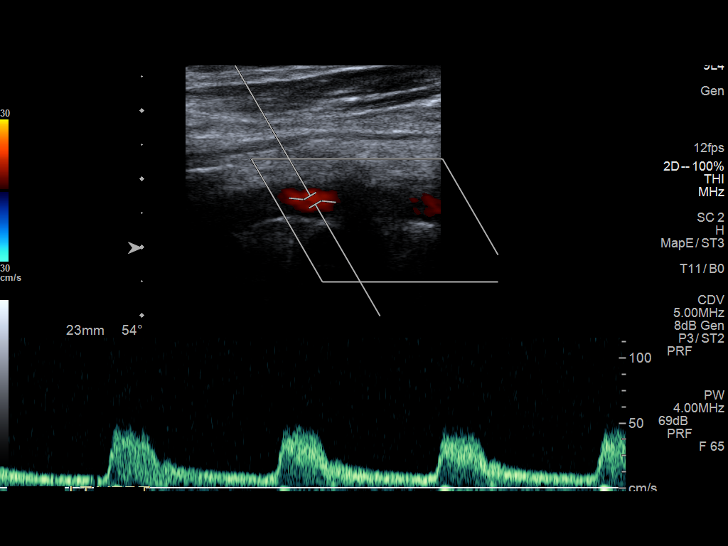

[13 of 24 positions shown; findings below may reference images not displayed]

FINDINGS: Criteria: Quantification of carotid stenosis is based on velocity
parameters that correlate the residual internal carotid diameter
with NASCET-based stenosis levels, using the diameter of the distal
internal carotid lumen as the denominator for stenosis measurement.

The following velocity measurements were obtained:

RIGHT

ICA: Peak systolic velocity 102 cm/sec, End diastolic velocity 17
cm/sec

CCA: Peak systolic velocity 75 cm/sec

SYSTOLIC ICA/CCA RATIO:

ECA: Peak systolic velocity 63 cm/sec

LEFT

ICA: Peak systolic velocity 132 cm/sec, End diastolic velocity 25
cm/sec

CCA: 86 cm/sec

SYSTOLIC ICA/CCA RATIO:

ECA: 69 cm/sec

RIGHT CAROTID ARTERY: Moderate multifocal atherosclerotic plaque
formation most prominent about the carotid bulb and bifurcation. No
significant tortuosity. Normal low resistance waveforms.

RIGHT VERTEBRAL ARTERY:  Antegrade flow.

LEFT CAROTID ARTERY: Smooth, heterogeneous, and focal
atherosclerotic plaque formation within the proximal to mid internal
carotid artery. No significant tortuosity. Normal low resistance
waveforms.

LEFT VERTEBRAL ARTERY:  Antegrade flow.

Upper extremity non-invasive blood pressures:

Not obtained.
IMPRESSION: 1. Right carotid artery system: Less than 50% stenosis secondary to
moderate multifocal atherosclerotic plaque formation.

2. Left carotid artery system: 50-69% stenosis secondary to focal
atherosclerotic plaque formation in the proximal to mid internal
carotid artery.

3.  Vertebral artery system: Patent with antegrade flow bilaterally.

## 2022-08-13 NOTE — Progress Notes (Unsigned)
Assessment/Plan:    1.  Essential Tremor  -Patient off of primidone.  Still with a little bit of tremor, but manageable.  2.  ataxia  -suspect diabetic peripheral neuropathy.  She has had diabetes for a long time.  Other causes for reversible peripheral neuropathy were negative.  -We will check her CT brain just to make sure we are not missing anything, but I do not see anything lateralizing on her examination.  She cannot have an MRI of the brain because of her pacemaker.   3.  Symptomatic left carotid stenosis  -On aspirin and statin  -Following with vascular surgery  Subjective:   Grace Robinson was seen today in follow up for balance trouble.  My previous records were reviewed prior to todays visit.  Carotid ultrasound done since last visit that demonstrated 50 to 69% stenosis on the left.  This was asymptomatic and I told her that medical treatment that she was already on was likely the only indication for therapy, but was happy to refer her to vascular for consult.  She did that and had a consult with Dr. Scot Dock on June 29.  As suspected, he told her to continue on the aspirin and statin and follow-up in 1 year.  We did tell her last visit that we suspected that her balance difficulties were from diabetic peripheral neuropathy.  We did check other lab work just to make sure we were not missing other causes of neuropathy and those labs were unremarkable.  My intent was to order a CT brain last visit, again to make sure we are not missing anything, but I do not see that that was completed. ***.  We did send her to physical therapy for gait and balance and she reports today that ***.  Separately, she is scheduled for ICD replacement on October 30.  Notes from cardiology are reviewed.  Current prescribed movement disorder medications: primidone, 50 mg, 1 tablet nightly    ALLERGIES:   Allergies  Allergen Reactions   Carvedilol Other (See Comments)   Digoxin     Other  reaction(s): dig toxicity   Vancomycin Other (See Comments)   Sudafed [Pseudoephedrine] Other (See Comments)    Makes skin feel like its crawling    CURRENT MEDICATIONS:  Outpatient Encounter Medications as of 08/16/2022  Medication Sig   acetaminophen (TYLENOL) 500 MG tablet Take 500 mg by mouth as needed for mild pain.   aspirin EC 81 MG tablet Take 81 mg by mouth daily. Swallow whole.   atorvastatin (LIPITOR) 40 MG tablet Take 40 mg by mouth daily.   Cholecalciferol (VITAMIN D-3) 25 MCG (1000 UT) CAPS Take 2 capsules by mouth daily.   cholestyramine (QUESTRAN) 4 g packet Take 1 packet (4 g total) by mouth daily.   dicyclomine (BENTYL) 10 MG capsule TAKE ONE CAPSULE BY MOUTH THREE TIMES DAILY, 30 MINUTES BEFORE MEALS   furosemide (LASIX) 20 MG tablet Take 1 tablet (20 mg total) by mouth daily. alternating (40 mg) every other day.   HUMALOG MIX 75/25 (75-25) 100 UNIT/ML SUSP injection Inject 32 Units into the skin 2 (two) times daily with a meal.    Lancets (ONETOUCH DELICA PLUS [id number]) MISC CHECK BLOOD SUGAR TWICE DAILY   loperamide (IMODIUM A-D) 2 MG tablet Take 2 mg by mouth as needed for diarrhea or loose stools.   metoprolol succinate (TOPROL-XL) 100 MG 24 hr tablet Take 1 tablet by mouth 2 (two) times daily.   olmesartan (BENICAR) 40  MG tablet Take 40 mg by mouth daily.   ONETOUCH VERIO test strip 2 (two) times daily.   potassium chloride (K-DUR) 10 MEQ tablet Take 1 tablet by mouth 2 (two) times daily.   vitamin B-12 (CYANOCOBALAMIN) 1000 MCG tablet Take 1,000 mcg by mouth daily.   vitamin E 400 UNIT capsule Take 400 Units by mouth daily.   No facility-administered encounter medications on file as of 08/16/2022.     Objective:    PHYSICAL EXAMINATION:    VITALS:   There were no vitals filed for this visit.    GEN:  The patient appears stated age and is in NAD. HEENT:  Normocephalic, atraumatic.  The mucous membranes are moist. The superficial temporal arteries are  without ropiness or tenderness. CV:  RRR Lungs:  CTAB Neck/HEME:  There are no carotid bruits bilaterally.  Neurological examination:  Orientation: The patient is alert and oriented x3. Cranial nerves: There is good facial symmetry. The speech is fluent and clear. Soft palate rises symmetrically and there is no tongue deviation. Hearing is decreased to conversational tone. Sensation: Sensation is intact to light touch throughout.  Vibration is decreased distally, markedly so.  Pinprick is symmetric.  There is no extinction with double simultaneous stimulation. Motor: Strength is 5/5 in the bilateral upper and left lower extremity.  Strength is 5 -/5 in the right lower extremity. Deep tendon reflexes: Deep tendon reflexes are 2/4 at the bilateral biceps, triceps, brachioradialis, 0-1 at the bilateral patella and Achilles.  Movement examination: Tone: There is normal tone in the UE/LE Abnormal movements: there is no rest tremor.  There is just a little bit of postural tremor. Coordination:  There is no decremation with RAM's, with any form of RAMS, including alternating supination and pronation of the forearm, hand opening and closing, finger taps, heel taps and toe taps.  She is slow when asked to perform active coordination, but no decremation. Gait and Station: The patient is ataxic, short stepped and tenous without the walker.  She ambulates fairly well with a walker, however. I have reviewed and interpreted the following labs independently   Chemistry      Component Value Date/Time   NA 144 08/02/2022 1559   K 3.8 08/02/2022 1559   CL 100 08/02/2022 1559   CO2 26 08/02/2022 1559   BUN 15 08/02/2022 1559   CREATININE 0.89 08/02/2022 1559   CREATININE 0.81 07/30/2016 0841      Component Value Date/Time   CALCIUM 9.2 08/02/2022 1559   ALKPHOS 79 11/10/2020 1512   AST 21 11/10/2020 1512   ALT 18 11/10/2020 1512   BILITOT 0.6 11/10/2020 1512      Lab Results  Component Value  Date   WBC 7.2 08/02/2022   HGB 13.8 08/02/2022   HCT 40.3 08/02/2022   MCV 95 08/02/2022   PLT 145 (L) 08/02/2022   Lab Results  Component Value Date   TSH 1.23 07/20/2011     Chemistry      Component Value Date/Time   NA 144 08/02/2022 1559   K 3.8 08/02/2022 1559   CL 100 08/02/2022 1559   CO2 26 08/02/2022 1559   BUN 15 08/02/2022 1559   CREATININE 0.89 08/02/2022 1559   CREATININE 0.81 07/30/2016 0841      Component Value Date/Time   CALCIUM 9.2 08/02/2022 1559   ALKPHOS 79 11/10/2020 1512   AST 21 11/10/2020 1512   ALT 18 11/10/2020 1512   BILITOT 0.6 11/10/2020 1512  Lab Results  Component Value Date   VITAMINB12 >1504 (H) 03/02/2022       Total time spent on today's visit was *** minutes, including both face-to-face time and nonface-to-face time.  Time included that spent on review of records (prior notes available to me/labs/imaging if pertinent), discussing treatment and goals, answering patient's questions and coordinating care.  Cc:  Shon Baton, MD

## 2022-08-16 ENCOUNTER — Other Ambulatory Visit: Payer: Self-pay

## 2022-08-16 ENCOUNTER — Ambulatory Visit: Payer: Medicare Other | Admitting: Neurology

## 2022-08-16 ENCOUNTER — Encounter: Payer: Self-pay | Admitting: Neurology

## 2022-08-16 VITALS — BP 135/86 | HR 76 | Ht 66.0 in | Wt 137.6 lb

## 2022-08-16 DIAGNOSIS — I6522 Occlusion and stenosis of left carotid artery: Secondary | ICD-10-CM

## 2022-08-16 DIAGNOSIS — G609 Hereditary and idiopathic neuropathy, unspecified: Secondary | ICD-10-CM | POA: Diagnosis not present

## 2022-08-17 DIAGNOSIS — Z794 Long term (current) use of insulin: Secondary | ICD-10-CM | POA: Diagnosis not present

## 2022-08-17 DIAGNOSIS — E1122 Type 2 diabetes mellitus with diabetic chronic kidney disease: Secondary | ICD-10-CM | POA: Diagnosis not present

## 2022-08-17 DIAGNOSIS — I2510 Atherosclerotic heart disease of native coronary artery without angina pectoris: Secondary | ICD-10-CM | POA: Diagnosis not present

## 2022-08-17 DIAGNOSIS — E1140 Type 2 diabetes mellitus with diabetic neuropathy, unspecified: Secondary | ICD-10-CM | POA: Diagnosis not present

## 2022-08-20 NOTE — Progress Notes (Signed)
Remote ICD transmission.   

## 2022-08-20 NOTE — Addendum Note (Signed)
Addended by: Douglass Rivers D on: 08/20/2022 09:26 PM   Modules accepted: Level of Service

## 2022-08-25 ENCOUNTER — Telehealth: Payer: Self-pay | Admitting: Internal Medicine

## 2022-08-25 NOTE — Telephone Encounter (Signed)
Spoke with pt and advised the night before her procedure (10/29) she should take 1/2 of her regular dose of insulin and the morning of procedure (10/30) she will hold her insulin and Furosemide.  Pt verbalizes understanding and agrees with current plan.

## 2022-08-25 NOTE — Telephone Encounter (Signed)
Patient stated she is scheduled for surgery on Monday.  Patient stated she is aware she needs to hold her medications but would like to know if she should hold her insulin as well as she will have nothing to eat prior to the surgery.

## 2022-08-27 NOTE — Pre-Procedure Instructions (Signed)
Instructed patient on the following items: Arrival time 0730 Nothing to eat or drink after midnight No meds AM of procedure Responsible person to drive you home and stay with you for 24 hrs Wash with special soap night before and morning of procedure

## 2022-08-30 ENCOUNTER — Encounter (HOSPITAL_COMMUNITY)
Admission: RE | Disposition: A | Discharge status: Home / Self Care | Payer: Self-pay | Source: Home / Self Care | Attending: Internal Medicine

## 2022-08-30 ENCOUNTER — Other Ambulatory Visit: Payer: Self-pay

## 2022-08-30 ENCOUNTER — Ambulatory Visit (HOSPITAL_COMMUNITY)
Admission: RE | Admit: 2022-08-30 | Discharge: 2022-08-30 | Disposition: A | Discharge status: Home / Self Care | Payer: Medicare Other | Attending: Internal Medicine | Admitting: Internal Medicine

## 2022-08-30 ENCOUNTER — Encounter (HOSPITAL_COMMUNITY): Payer: Self-pay | Admitting: Internal Medicine

## 2022-08-30 DIAGNOSIS — E119 Type 2 diabetes mellitus without complications: Secondary | ICD-10-CM | POA: Insufficient documentation

## 2022-08-30 DIAGNOSIS — I472 Ventricular tachycardia, unspecified: Secondary | ICD-10-CM | POA: Diagnosis not present

## 2022-08-30 DIAGNOSIS — Z87891 Personal history of nicotine dependence: Secondary | ICD-10-CM | POA: Insufficient documentation

## 2022-08-30 DIAGNOSIS — I110 Hypertensive heart disease with heart failure: Secondary | ICD-10-CM | POA: Diagnosis not present

## 2022-08-30 DIAGNOSIS — E785 Hyperlipidemia, unspecified: Secondary | ICD-10-CM | POA: Diagnosis not present

## 2022-08-30 DIAGNOSIS — K5190 Ulcerative colitis, unspecified, without complications: Secondary | ICD-10-CM | POA: Insufficient documentation

## 2022-08-30 DIAGNOSIS — I5022 Chronic systolic (congestive) heart failure: Secondary | ICD-10-CM | POA: Insufficient documentation

## 2022-08-30 DIAGNOSIS — Z8673 Personal history of transient ischemic attack (TIA), and cerebral infarction without residual deficits: Secondary | ICD-10-CM | POA: Insufficient documentation

## 2022-08-30 DIAGNOSIS — Z794 Long term (current) use of insulin: Secondary | ICD-10-CM | POA: Diagnosis not present

## 2022-08-30 DIAGNOSIS — H8109 Meniere's disease, unspecified ear: Secondary | ICD-10-CM | POA: Insufficient documentation

## 2022-08-30 DIAGNOSIS — Z45010 Encounter for checking and testing of cardiac pacemaker pulse generator [battery]: Secondary | ICD-10-CM | POA: Insufficient documentation

## 2022-08-30 DIAGNOSIS — I428 Other cardiomyopathies: Secondary | ICD-10-CM | POA: Insufficient documentation

## 2022-08-30 HISTORY — PX: BIV PACEMAKER GENERATOR CHANGEOUT: EP1198

## 2022-08-30 LAB — GLUCOSE, CAPILLARY: Glucose-Capillary: 203 mg/dL — ABNORMAL HIGH (ref 70–99)

## 2022-08-30 SURGERY — BIV PACEMAKER GENERATOR CHANGEOUT

## 2022-08-30 MED ORDER — SODIUM CHLORIDE 0.9 % IV SOLN: INTRAVENOUS | Status: DC

## 2022-08-30 MED ORDER — SODIUM CHLORIDE 0.9 % IV SOLN
80.0000 mg | INTRAVENOUS | Status: AC
  Administered 2022-08-30: 80 mg

## 2022-08-30 MED ORDER — LIDOCAINE HCL (PF) 1 % IJ SOLN
INTRAMUSCULAR | Status: DC | PRN
  Administered 2022-08-30: 60 mL

## 2022-08-30 MED ORDER — CEFAZOLIN SODIUM-DEXTROSE 2-4 GM/100ML-% IV SOLN
INTRAVENOUS | Status: AC
  Filled 2022-08-30: qty 100

## 2022-08-30 MED ORDER — CHLORHEXIDINE GLUCONATE 4 % EX LIQD: 4.0000 | Freq: Once | CUTANEOUS | Status: DC

## 2022-08-30 MED ORDER — POVIDONE-IODINE 10 % EX SWAB: 2.0000 | Freq: Once | CUTANEOUS | Status: DC

## 2022-08-30 MED ORDER — LIDOCAINE HCL 1 % IJ SOLN
INTRAMUSCULAR | Status: AC
  Filled 2022-08-30: qty 60

## 2022-08-30 MED ORDER — HYDRALAZINE HCL 20 MG/ML IJ SOLN
INTRAMUSCULAR | Status: AC
  Filled 2022-08-30: qty 1

## 2022-08-30 MED ORDER — SODIUM CHLORIDE 0.9 % IV SOLN
INTRAVENOUS | Status: AC
  Filled 2022-08-30: qty 2

## 2022-08-30 MED ORDER — HYDRALAZINE HCL 20 MG/ML IJ SOLN
INTRAMUSCULAR | Status: DC | PRN
  Administered 2022-08-30: 10 mg via INTRAVENOUS

## 2022-08-30 MED ORDER — CEFAZOLIN SODIUM-DEXTROSE 2-4 GM/100ML-% IV SOLN
2.0000 g | INTRAVENOUS | Status: AC
  Administered 2022-08-30: 2 g via INTRAVENOUS

## 2022-08-30 MED ORDER — ACETAMINOPHEN 325 MG PO TABS: 325.0000 mg | ORAL_TABLET | ORAL | Status: DC | PRN

## 2022-08-30 SURGICAL SUPPLY — 12 items
CABLE SURGICAL S-101-97-12 (CABLE) ×1 IMPLANT
DEVICE DISSECT PLASMABLAD 3.0S (MISCELLANEOUS) IMPLANT
HEMOSTAT SURGICEL 2X4 FIBR (HEMOSTASIS) IMPLANT
KIT LEAD END CAP (Cap) IMPLANT
LEAD END CAP (Cap) ×2 IMPLANT
PACEMAKER PRCT MRI CRTP W1TR01 (Pacemaker) IMPLANT
PAD DEFIB RADIO PHYSIO CONN (PAD) ×1 IMPLANT
PLASMABLADE 3.0S (MISCELLANEOUS) ×1
POUCH AIGIS-R ANTIBACT PPM (Mesh General) ×1 IMPLANT
POUCH AIGIS-R ANTIBACT PPM MED (Mesh General) IMPLANT
PPM PRECEPTA MRI CRT-P W1TR01 (Pacemaker) ×1 IMPLANT
TRAY PACEMAKER INSERTION (PACKS) ×1 IMPLANT

## 2022-08-30 NOTE — Interval H&P Note (Signed)
History and Physical Interval Note:  08/30/2022 9:08 AM  Grace Robinson  has presented today for surgery, with the diagnosis of eri.  The various methods of treatment have been discussed with the patient and family. After consideration of risks, benefits and other options for treatment, the patient has consented to  Procedure(s): Luis M. Cintron (N/A) as a surgical intervention.  The patient's history has been reviewed, patient examined, no change in status, stable for surgery.  I have reviewed the patient's chart and labs.  Questions were answered to the patient's satisfaction.     Virl Axe

## 2022-09-09 ENCOUNTER — Ambulatory Visit: Payer: Medicare Other | Attending: Internal Medicine

## 2022-09-09 DIAGNOSIS — Z95810 Presence of automatic (implantable) cardiac defibrillator: Secondary | ICD-10-CM

## 2022-09-09 DIAGNOSIS — I428 Other cardiomyopathies: Secondary | ICD-10-CM | POA: Diagnosis not present

## 2022-09-09 LAB — CUP PACEART INCLINIC DEVICE CHECK
Battery Remaining Longevity: 147 mo
Battery Voltage: 3.22 V
Brady Statistic AP VP Percent: 16.24 %
Brady Statistic AP VS Percent: 0.32 %
Brady Statistic AS VP Percent: 81.31 %
Brady Statistic AS VS Percent: 2.12 %
Brady Statistic RA Percent Paced: 17.01 %
Brady Statistic RV Percent Paced: 1.41 %
Date Time Interrogation Session: 20231109155650
Implantable Lead Connection Status: 753985
Implantable Lead Connection Status: 753985
Implantable Lead Connection Status: 753985
Implantable Lead Connection Status: 753985
Implantable Lead Implant Date: 20031007
Implantable Lead Implant Date: 20031007
Implantable Lead Implant Date: 20031007
Implantable Lead Implant Date: 20060227
Implantable Lead Location: 753858
Implantable Lead Location: 753859
Implantable Lead Location: 753860
Implantable Lead Location: 753860
Implantable Lead Model: 4193
Implantable Lead Model: 4469
Implantable Lead Model: 4470
Implantable Lead Model: 6949
Implantable Lead Serial Number: 337988
Implantable Lead Serial Number: 361236
Implantable Pulse Generator Implant Date: 20231030
Lead Channel Impedance Value: 3363 Ohm
Lead Channel Impedance Value: 3363 Ohm
Lead Channel Impedance Value: 3363 Ohm
Lead Channel Impedance Value: 342 Ohm
Lead Channel Impedance Value: 361 Ohm
Lead Channel Impedance Value: 418 Ohm
Lead Channel Impedance Value: 551 Ohm
Lead Channel Impedance Value: 551 Ohm
Lead Channel Impedance Value: 665 Ohm
Lead Channel Pacing Threshold Amplitude: 0.625 V
Lead Channel Pacing Threshold Amplitude: 1.25 V
Lead Channel Pacing Threshold Amplitude: 1.25 V
Lead Channel Pacing Threshold Amplitude: 1.875 V
Lead Channel Pacing Threshold Pulse Width: 0.4 ms
Lead Channel Pacing Threshold Pulse Width: 0.4 ms
Lead Channel Pacing Threshold Pulse Width: 0.4 ms
Lead Channel Pacing Threshold Pulse Width: 0.8 ms
Lead Channel Sensing Intrinsic Amplitude: 1.125 mV
Lead Channel Sensing Intrinsic Amplitude: 2 mV
Lead Channel Sensing Intrinsic Amplitude: 31.625 mV
Lead Channel Setting Pacing Amplitude: 1.5 V
Lead Channel Setting Pacing Amplitude: 1.5 V
Lead Channel Setting Pacing Amplitude: 3.75 V
Lead Channel Setting Pacing Pulse Width: 0.4 ms
Lead Channel Setting Pacing Pulse Width: 0.8 ms
Lead Channel Setting Sensing Sensitivity: 1.2 mV
Zone Setting Status: 755011

## 2022-09-09 NOTE — Progress Notes (Signed)
Wound check appointment. Dermabond removed. Wound without redness or edema. Incision edges approximated, wound well healed. Normal device function. Thresholds, sensing, and impedances consistent with implant measurements. Device programmed at chronic settings for post generator change. Histogram distribution appropriate for patient and level of activity. Brief high ventricular episode noted. Patient educated about wound care. ROV in 3 months with implanting physician.

## 2022-09-09 NOTE — Patient Instructions (Signed)
   After Your Pacemaker   Monitor your pacemaker site for redness, swelling, and drainage. Call the device clinic at [phone] if you experience these symptoms or fever/chills.  Your incision was closed with Dermabond:  You may shower 1 day after your defibrillator implant and wash your incision with soap and water. Avoid lotions, ointments, or perfumes over your incision until it is well-healed.  You may use a hot tub or a pool after your wound check appointment if the incision is completely closed.  You may drive, unless driving has been restricted by your healthcare providers.  Remote monitoring is used to monitor your pacemaker from home. This monitoring is scheduled every 91 days by our office. It allows [place] to keep an eye on the functioning of your device to ensure it is working properly. You will routinely see your Electrophysiologist annually (more often if necessary).

## 2022-09-29 ENCOUNTER — Emergency Department (HOSPITAL_COMMUNITY): Payer: Medicare Other

## 2022-09-29 ENCOUNTER — Other Ambulatory Visit: Payer: Self-pay

## 2022-09-29 ENCOUNTER — Emergency Department (HOSPITAL_COMMUNITY)
Admission: EM | Admit: 2022-09-29 | Discharge: 2022-09-29 | Disposition: A | Discharge status: Home / Self Care | Payer: Medicare Other | Attending: Emergency Medicine | Admitting: Emergency Medicine

## 2022-09-29 DIAGNOSIS — Z23 Encounter for immunization: Secondary | ICD-10-CM | POA: Diagnosis not present

## 2022-09-29 DIAGNOSIS — W010XXA Fall on same level from slipping, tripping and stumbling without subsequent striking against object, initial encounter: Secondary | ICD-10-CM | POA: Insufficient documentation

## 2022-09-29 DIAGNOSIS — S0990XA Unspecified injury of head, initial encounter: Secondary | ICD-10-CM | POA: Diagnosis not present

## 2022-09-29 DIAGNOSIS — Z7982 Long term (current) use of aspirin: Secondary | ICD-10-CM | POA: Diagnosis not present

## 2022-09-29 DIAGNOSIS — I509 Heart failure, unspecified: Secondary | ICD-10-CM | POA: Diagnosis not present

## 2022-09-29 DIAGNOSIS — S0101XA Laceration without foreign body of scalp, initial encounter: Secondary | ICD-10-CM | POA: Diagnosis not present

## 2022-09-29 DIAGNOSIS — I959 Hypotension, unspecified: Secondary | ICD-10-CM | POA: Diagnosis not present

## 2022-09-29 DIAGNOSIS — I10 Essential (primary) hypertension: Secondary | ICD-10-CM | POA: Diagnosis not present

## 2022-09-29 DIAGNOSIS — I110 Hypertensive heart disease with heart failure: Secondary | ICD-10-CM | POA: Diagnosis not present

## 2022-09-29 DIAGNOSIS — W19XXXA Unspecified fall, initial encounter: Secondary | ICD-10-CM

## 2022-09-29 DIAGNOSIS — Z794 Long term (current) use of insulin: Secondary | ICD-10-CM | POA: Diagnosis not present

## 2022-09-29 DIAGNOSIS — Z043 Encounter for examination and observation following other accident: Secondary | ICD-10-CM | POA: Diagnosis not present

## 2022-09-29 DIAGNOSIS — T68XXXA Hypothermia, initial encounter: Secondary | ICD-10-CM | POA: Diagnosis not present

## 2022-09-29 DIAGNOSIS — R0689 Other abnormalities of breathing: Secondary | ICD-10-CM | POA: Diagnosis not present

## 2022-09-29 DIAGNOSIS — Z9889 Other specified postprocedural states: Secondary | ICD-10-CM | POA: Diagnosis not present

## 2022-09-29 DIAGNOSIS — E119 Type 2 diabetes mellitus without complications: Secondary | ICD-10-CM | POA: Insufficient documentation

## 2022-09-29 DIAGNOSIS — Z79899 Other long term (current) drug therapy: Secondary | ICD-10-CM | POA: Insufficient documentation

## 2022-09-29 LAB — CBG MONITORING, ED: Glucose-Capillary: 82 mg/dL (ref 70–99)

## 2022-09-29 MED ORDER — TETANUS-DIPHTH-ACELL PERTUSSIS 5-2.5-18.5 LF-MCG/0.5 IM SUSY
0.5000 mL | PREFILLED_SYRINGE | Freq: Once | INTRAMUSCULAR | Status: AC
  Administered 2022-09-29: 0.5 mL via INTRAMUSCULAR
  Filled 2022-09-29: qty 0.5

## 2022-09-29 MED ORDER — ACETAMINOPHEN 325 MG PO TABS
650.0000 mg | ORAL_TABLET | Freq: Once | ORAL | Status: AC
  Administered 2022-09-29: 650 mg via ORAL
  Filled 2022-09-29: qty 2

## 2022-09-29 MED ORDER — LIDOCAINE HCL (PF) 1 % IJ SOLN
5.0000 mL | Freq: Once | INTRAMUSCULAR | Status: AC
  Administered 2022-09-29: 5 mL
  Filled 2022-09-29: qty 5

## 2022-09-29 NOTE — Discharge Instructions (Signed)
You were seen in the ER after your fall. Your CT's showed no broken bones or bleeding in your brain. You did have a cut on the back of your scalp that was repaired with 4 staples. These can be removed by your primary doctor in 7-10 days. You should return to the emergency department for worsening headaches, repetitive vomiting, confusion, you pass out or if you have any other new or concerning symptoms.

## 2022-09-29 NOTE — ED Provider Notes (Signed)
[facility] EMERGENCY DEPARTMENT Provider Note   CSN: [id number] Arrival date & time: 09/29/22  1436     History  Chief Complaint  Patient presents with   Grace Robinson is a 88 y.o. female.  Patient is a 88 year old female with a PMH CHF with ICD in place, HTN, and DM presenting to the ED after a fall. The patient states that Grace Robinson tripped and fell earlier this morning.  Grace Robinson states that Grace Robinson hit the back of her head but had no loss of consciousness.  Grace Robinson states that Grace Robinson is having a mild headache.  Grace Robinson denies any nausea, vomiting, numbness or weakness or vision changes.  Grace Robinson denies any other pain or injury from the falls.  The history is provided by the patient.  Fall       Home Medications Prior to Admission medications   Medication Sig Start Date End Date Taking? Authorizing Provider  acetaminophen (TYLENOL) 500 MG tablet Take 1,000 mg by mouth every 8 (eight) hours as needed for moderate pain.    [provider]  aspirin EC 81 MG tablet Take 81 mg by mouth daily. Swallow whole.    [provider]  atorvastatin (LIPITOR) 40 MG tablet Take 40 mg by mouth daily. 04/21/15   [provider]  Cholecalciferol (VITAMIN D-3) 25 MCG (1000 UT) CAPS Take 2,000 Units by mouth daily.    [provider]  cholestyramine (QUESTRAN) 4 g packet Take 1 packet (4 g total) by mouth daily. 08/12/22   Esterwood, [name] S, PA-C  CRANBERRY CONCENTRATE PO Take 1 tablet by mouth daily.    [provider]  dicyclomine (BENTYL) 10 MG capsule TAKE ONE CAPSULE BY MOUTH THREE TIMES DAILY, 30 MINUTES BEFORE MEALS 08/12/22   Esterwood, [name] S, PA-C  furosemide (LASIX) 20 MG tablet Take 1 tablet (20 mg total) by mouth daily. alternating (40 mg) every other day. 02/08/17   Burtis Junes, NP  HUMALOG MIX 75/25 (75-25) 100 UNIT/ML SUSP injection Inject 32 Units into the skin 2 (two) times daily with a meal.  04/21/15   [provider]   Lancets (ONETOUCH DELICA PLUS [id number]) [place] 07/25/20   [provider]  loperamide (IMODIUM A-D) 2 MG tablet Take 2 mg by mouth as needed for diarrhea or loose stools.    [provider]  metoprolol succinate (TOPROL-XL) 100 MG 24 hr tablet Take 100 mg by mouth 2 (two) times daily. 07/04/13   [provider]  olmesartan (BENICAR) 40 MG tablet Take 40 mg by mouth daily. 01/17/18   [provider]  [facility] VERIO test strip 2 (two) times daily. 07/25/20   [provider]  potassium chloride (K-DUR) 10 MEQ tablet Take 10 mEq by mouth 2 (two) times daily. 04/29/19   [provider]  vitamin B-12 (CYANOCOBALAMIN) 1000 MCG tablet Take 1,000 mcg by mouth daily.    [provider]  vitamin E 400 UNIT capsule Take 400 Units by mouth daily.    [provider]      Allergies    Carvedilol, Digoxin, Vancomycin, and Sudafed [pseudoephedrine]    Review of Systems   Review of Systems  Physical Exam Updated Vital Signs BP (!) 193/96   Pulse 78   Temp 97.6 F (36.4 C) (Oral)   Resp 16   Ht '5\' 6"'$  (1.676 m)   Wt 62.1 kg   SpO2 99%   BMI  22.11 kg/m  Physical Exam Vitals and nursing note reviewed.  Constitutional:      General: Grace Robinson is not in acute distress.    Appearance: Normal appearance.  HENT:     Head: Normocephalic.     Comments: 2 cm laceration to posterior scalp with mild oozing    Nose: Nose normal.     Mouth/Throat:     Mouth: Mucous membranes are moist.     Pharynx: Oropharynx is clear.  Eyes:     Extraocular Movements: Extraocular movements intact.     Conjunctiva/sclera: Conjunctivae normal.     Pupils: Pupils are equal, round, and reactive to light.  Neck:     Comments: No midline neck tenderness Cardiovascular:     Rate and Rhythm: Normal rate and regular rhythm.     Pulses: Normal pulses.     Heart sounds: Normal heart sounds.  Pulmonary:     Effort: Pulmonary effort is  normal.     Breath sounds: Normal breath sounds.  Abdominal:     General: Abdomen is flat.     Palpations: Abdomen is soft.     Tenderness: There is no abdominal tenderness.  Musculoskeletal:        General: Normal range of motion.     Cervical back: Normal range of motion and neck supple.     Comments: No midline back tenderness No tenderness to palpation of bilateral upper and lower extremities  Skin:    General: Skin is warm and dry.  Neurological:     General: No focal deficit present.     Mental Status: Grace Robinson is alert and oriented to person, place, and time.     Sensory: No sensory deficit.     Motor: No weakness.  Psychiatric:        Mood and Affect: Mood normal.        Behavior: Behavior normal.     ED Results / Procedures / Treatments   Labs (all labs ordered are listed, but only abnormal results are displayed) Labs Reviewed  CBG MONITORING, ED    EKG None  Radiology CT Cervical Spine Wo Contrast  Result Date: 09/29/2022 CLINICAL DATA:  Unwitnessed fall, striking the back of the head. EXAM: CT CERVICAL SPINE WITHOUT CONTRAST TECHNIQUE: Multidetector CT imaging of the cervical spine was performed without intravenous contrast. Multiplanar CT image reconstructions were also generated. RADIATION DOSE REDUCTION: This exam was performed according to the departmental dose-optimization program which includes automated exposure control, adjustment of the mA and/or kV according to patient size and/or use of iterative reconstruction technique. COMPARISON:  08/27/2016 FINDINGS: Alignment: No vertebral subluxation is observed. Skull base and vertebrae: Calcification along the C1 transverse ligament and along the anterior C1-2 articulation as before. No fracture or acute bony finding identified. Soft tissues and spinal canal: Mild bilateral common carotid atherosclerotic calcification. Disc levels: Uncinate and facet spurring cause bilateral foraminal impingement at C4-5, C5-6, and  C6-7. There is loss of disc height at the same levels attributable to degenerative disc disease. Upper chest: Biapical pleuroparenchymal scarring. This is in continuity with the previously identified ground-glass density nodule at the right apex shown back on 08/27/2016, which is likely a postinflammatory residuum. Other: No supplemental non-categorized findings. IMPRESSION: 1. No acute cervical spine findings. 2. Cervical spondylosis and degenerative disc disease causing bilateral foraminal impingement at C4-5, C5-6, and C6-7. 3. Mild bilateral common carotid atherosclerotic calcification. 4. Biapical pleuroparenchymal scarring, in continuity with the previously identified ground-glass density nodule at the right apex shown  back on 08/27/2016, which is likely a postinflammatory residuum. Electronically Signed   By: Van Clines M.D.   On: 09/29/2022 16:47   CT Head Wo Contrast  Result Date: 09/29/2022 CLINICAL DATA:  Head trauma, minor (Age >= 65y) EXAM: CT HEAD WITHOUT CONTRAST TECHNIQUE: Contiguous axial images were obtained from the base of the skull through the vertex without intravenous contrast. RADIATION DOSE REDUCTION: This exam was performed according to the departmental dose-optimization program which includes automated exposure control, adjustment of the mA and/or kV according to patient size and/or use of iterative reconstruction technique. COMPARISON:  02/14/2020 FINDINGS: Brain: There is periventricular white matter decreased attenuation consistent with small vessel ischemic changes. Ventricles, sulci and cisterns are prominent consistent with age related involutional changes. No acute intracranial hemorrhage, mass effect or shift. No hydrocephalus. Vascular: No hyperdense vessel or unexpected calcification. Skull: Normal. Negative for fracture or focal lesion. Sinuses/Orbits: No acute finding. Mucoperiosteal thickening consistent with chronic right maxillary sinusitis. Postop changes in  the left mastoid. IMPRESSION: Atrophy and chronic small vessel ischemic changes. No acute intracranial process identified. Electronically Signed   By: Sammie Bench M.D.   On: 09/29/2022 16:26    Procedures .Marland KitchenLaceration Repair  Date/Time: 09/29/2022 6:16 PM  Performed by: Kemper Durie, DO Authorized by: Kemper Durie, DO   Consent:    Consent obtained:  Verbal   Consent given by:  Patient   Risks, benefits, and alternatives were discussed: yes     Risks discussed:  Infection, pain, need for additional repair, poor cosmetic result, poor wound healing and nerve damage   Alternatives discussed:  No treatment, delayed treatment and observation Universal protocol:    Procedure explained and questions answered to patient or proxy's satisfaction: yes     Patient identity confirmed:  Verbally with patient Anesthesia:    Anesthesia method:  Local infiltration   Local anesthetic:  Lidocaine 1% w/o epi Laceration details:    Location:  Scalp   Scalp location:  Occipital   Length (cm):  2   Depth (mm):  2 Exploration:    Limited defect created (wound extended): yes     Hemostasis achieved with:  Direct pressure   Imaging obtained comment:  CT head   Imaging outcome: foreign body not noted     Wound exploration: wound explored through full range of motion and entire depth of wound visualized     Wound extent: areolar tissue not violated, fascia not violated, no foreign body, no signs of injury, no nerve damage, no tendon damage, no underlying fracture and no vascular damage     Contaminated: no   Treatment:    Area cleansed with:  Saline   Amount of cleaning:  Standard   Irrigation solution:  Sterile saline   Visualized foreign bodies/material removed: no     Debridement:  None   Undermining:  None   Scar revision: no   Skin repair:    Repair method:  Staples   Number of staples:  4 Approximation:    Approximation:  Close Repair type:    Repair type:   Simple Post-procedure details:    Dressing:  Open (no dressing)   Procedure completion:  Tolerated well, no immediate complications     Medications Ordered in ED Medications  acetaminophen (TYLENOL) tablet 650 mg (has no administration in time range)  lidocaine (PF) (XYLOCAINE) 1 % injection 5 mL (5 mLs Infiltration Given by Other 09/29/22 1618)  Tdap (BOOSTRIX) injection 0.5 mL (0.5 mLs Intramuscular Given  09/29/22 1558)    ED Course/ Medical Decision Making/ A&P                           Medical Decision Making This patient presents to the ED with chief complaint(s) of fall with pertinent past medical history of CHF with ICD, HTN, DM which further complicates the presenting complaint. The complaint involves an extensive differential diagnosis and also carries with it a high risk of complications and morbidity.    The differential diagnosis includes patient denies any presyncopal symptoms making a syncopal fall unlikely, due to her age and head injury concerning for ICH, mass effect or cervical spine fracture.  Grace Robinson has no other signs of traumatic injury on exam, Grace Robinson does have evidence of a laceration that will require repair  Additional history obtained: Additional history obtained from N/A Records reviewed N/A  ED Course and Reassessment: Upon patient's arrival to the emergency department Grace Robinson is awake and alert in no acute distress.  Head CT and C-spine were performed to evaluate for acute traumatic injury and showed no signs of any injury.  Grace Robinson had no other signs of injury on exam.  Her tetanus was updated.  Grace Robinson did require laceration repair and please see my procedure note.  Patient stable for discharge home with primary care follow-up and was given strict return precautions.  Independent labs interpretation:  The following labs were independently interpreted: Glucose within normal range  Independent visualization of imaging: - I independently visualized the following imaging  with scope of interpretation limited to determining acute life threatening conditions related to emergency care: CT head/C-spine, which revealed no acute traumatic injury  Consultation: - Consulted or discussed management/test interpretation w/ external professional: N/A  Consideration for admission or further workup: Patient has no emergent conditions requiring admission or further work-up at this time and is stable for discharge home with primary care follow-up  Social Determinants of health: N/A    Amount and/or Complexity of Data Reviewed Radiology: ordered.  Risk Prescription drug management.          Final Clinical Impression(s) / ED Diagnoses Final diagnoses:  Fall, initial encounter  Laceration of scalp, initial encounter    Rx / DC Orders ED Discharge Orders     None         Kemper Durie, DO 09/29/22 1823

## 2022-09-29 NOTE — ED Notes (Signed)
All discharge instructions including follow up care and prescriptions reviewed with patient and patient verbalized understanding of same.

## 2022-09-29 NOTE — ED Triage Notes (Signed)
Patient arrives via EMS from [facility] due to unwitnessed fall. Per patient she was walking in her kitchen and lost her balance and fell and hit the back of her head. Does not take blood thinners, laceration to back of head with bleeding controlled at this time. GCS 15. BP 222/98 HR 68 CBG 66 so EMS gave oral glucose prior to arrival.

## 2022-09-29 NOTE — ED Notes (Signed)
Patient given apple juice at this time due to CBG being 82.

## 2022-10-04 ENCOUNTER — Ambulatory Visit (INDEPENDENT_AMBULATORY_CARE_PROVIDER_SITE_OTHER): Payer: Medicare Other

## 2022-10-04 DIAGNOSIS — I5022 Chronic systolic (congestive) heart failure: Secondary | ICD-10-CM | POA: Diagnosis not present

## 2022-10-04 DIAGNOSIS — Z95810 Presence of automatic (implantable) cardiac defibrillator: Secondary | ICD-10-CM

## 2022-10-05 ENCOUNTER — Telehealth: Payer: Self-pay | Admitting: *Deleted

## 2022-10-05 NOTE — Telephone Encounter (Signed)
     Patient  visit on 09/29/2022  at  ed  was for fall   Have you been able to follow up with your primary care physician?  The patient was or was not able to obtain any needed medicine or equipment.  Are there diet recommendations that you are having difficulty following?  Patient expresses understanding of discharge instructions and education provided has no other needs at this time.    [place] [phone] [address] , [place] [address] Email : [email or web address]

## 2022-10-07 DIAGNOSIS — R531 Weakness: Secondary | ICD-10-CM | POA: Diagnosis not present

## 2022-10-07 DIAGNOSIS — S0101XA Laceration without foreign body of scalp, initial encounter: Secondary | ICD-10-CM | POA: Diagnosis not present

## 2022-10-07 DIAGNOSIS — E1122 Type 2 diabetes mellitus with diabetic chronic kidney disease: Secondary | ICD-10-CM | POA: Diagnosis not present

## 2022-10-07 DIAGNOSIS — I130 Hypertensive heart and chronic kidney disease with heart failure and stage 1 through stage 4 chronic kidney disease, or unspecified chronic kidney disease: Secondary | ICD-10-CM | POA: Diagnosis not present

## 2022-10-08 ENCOUNTER — Telehealth: Payer: Self-pay

## 2022-10-08 NOTE — Telephone Encounter (Signed)
Remote ICM transmission received.  Attempted call to patient regarding ICM remote transmission and left detailed message per DPR.  Advised to return call for any fluid symptoms or questions. Next ICM remote transmission scheduled 11/15/2022.

## 2022-10-08 NOTE — Progress Notes (Signed)
EPIC Encounter for ICM Monitoring  Patient Name: Grace Robinson is a 88 y.o. female Date: 10/08/2022 Primary Care Physican: Shon Baton, MD Primary Cardiologist: Caryl Comes Electrophysiologist: Vergie Living Pacing:  97.4%     Last Office Weight: 141 lbs   Time in AT/AF  (0.0%)           Attempted call to patient and unable to reach.  Left detailed message per DPR regarding transmission. Transmission reviewed. 11/29 ED visit for fall.   Optivol Thoracic impedance suggesting normal fluid levels.      Prescribed:  Furosemide 20 mg take 1 tablet (20 mg total) by mouth daily alternating (40 mg) every other day.   Potassium 10 mEq 1 tablet twice a day.   Labs: 08/02/2022 Creatinine 0.89, BUN 15, Potassium 3.8, Sodium 144, GFR 60 A complete set of results can be found in Results Review.   Recommendations:  Left voice mail with ICM number and encouraged to call if experiencing any fluid symptoms.   Follow-up plan: ICM clinic phone appointment on 11/15/2022.   91 day device clinic remote transmission 12/01/2022.      EP/Cardiology Office Visits:  12/07/2022 with Dr Caryl Comes.     Copy of ICM check sent to Dr. Caryl Comes.    3 month ICM trend: 10/04/2022.    12-14 Month ICM trend:     [name], RN 10/08/2022 9:54 AM

## 2022-10-11 DIAGNOSIS — E559 Vitamin D deficiency, unspecified: Secondary | ICD-10-CM | POA: Diagnosis not present

## 2022-10-11 DIAGNOSIS — I6523 Occlusion and stenosis of bilateral carotid arteries: Secondary | ICD-10-CM | POA: Diagnosis not present

## 2022-10-11 DIAGNOSIS — I130 Hypertensive heart and chronic kidney disease with heart failure and stage 1 through stage 4 chronic kidney disease, or unspecified chronic kidney disease: Secondary | ICD-10-CM | POA: Diagnosis not present

## 2022-10-11 DIAGNOSIS — I420 Dilated cardiomyopathy: Secondary | ICD-10-CM | POA: Diagnosis not present

## 2022-10-11 DIAGNOSIS — I5022 Chronic systolic (congestive) heart failure: Secondary | ICD-10-CM | POA: Diagnosis not present

## 2022-10-11 DIAGNOSIS — K589 Irritable bowel syndrome without diarrhea: Secondary | ICD-10-CM | POA: Diagnosis not present

## 2022-10-11 DIAGNOSIS — I255 Ischemic cardiomyopathy: Secondary | ICD-10-CM | POA: Diagnosis not present

## 2022-10-11 DIAGNOSIS — E1122 Type 2 diabetes mellitus with diabetic chronic kidney disease: Secondary | ICD-10-CM | POA: Diagnosis not present

## 2022-10-11 DIAGNOSIS — N1831 Chronic kidney disease, stage 3a: Secondary | ICD-10-CM | POA: Diagnosis not present

## 2022-10-11 DIAGNOSIS — E1143 Type 2 diabetes mellitus with diabetic autonomic (poly)neuropathy: Secondary | ICD-10-CM | POA: Diagnosis not present

## 2022-10-11 DIAGNOSIS — H9193 Unspecified hearing loss, bilateral: Secondary | ICD-10-CM | POA: Diagnosis not present

## 2022-10-11 DIAGNOSIS — D631 Anemia in chronic kidney disease: Secondary | ICD-10-CM | POA: Diagnosis not present

## 2022-10-11 DIAGNOSIS — H8109 Meniere's disease, unspecified ear: Secondary | ICD-10-CM | POA: Diagnosis not present

## 2022-10-11 DIAGNOSIS — I2510 Atherosclerotic heart disease of native coronary artery without angina pectoris: Secondary | ICD-10-CM | POA: Diagnosis not present

## 2022-10-11 DIAGNOSIS — D696 Thrombocytopenia, unspecified: Secondary | ICD-10-CM | POA: Diagnosis not present

## 2022-10-11 DIAGNOSIS — S0101XD Laceration without foreign body of scalp, subsequent encounter: Secondary | ICD-10-CM | POA: Diagnosis not present

## 2022-11-03 DIAGNOSIS — M6281 Muscle weakness (generalized): Secondary | ICD-10-CM | POA: Diagnosis not present

## 2022-11-03 DIAGNOSIS — R2681 Unsteadiness on feet: Secondary | ICD-10-CM | POA: Diagnosis not present

## 2022-11-08 DIAGNOSIS — R2681 Unsteadiness on feet: Secondary | ICD-10-CM | POA: Diagnosis not present

## 2022-11-08 DIAGNOSIS — M6281 Muscle weakness (generalized): Secondary | ICD-10-CM | POA: Diagnosis not present

## 2022-11-10 DIAGNOSIS — N6459 Other signs and symptoms in breast: Secondary | ICD-10-CM | POA: Diagnosis not present

## 2022-11-11 DIAGNOSIS — M6281 Muscle weakness (generalized): Secondary | ICD-10-CM | POA: Diagnosis not present

## 2022-11-11 DIAGNOSIS — R2681 Unsteadiness on feet: Secondary | ICD-10-CM | POA: Diagnosis not present

## 2022-11-15 ENCOUNTER — Ambulatory Visit (INDEPENDENT_AMBULATORY_CARE_PROVIDER_SITE_OTHER): Payer: Medicare Other

## 2022-11-15 DIAGNOSIS — I5022 Chronic systolic (congestive) heart failure: Secondary | ICD-10-CM

## 2022-11-15 DIAGNOSIS — Z95810 Presence of automatic (implantable) cardiac defibrillator: Secondary | ICD-10-CM | POA: Diagnosis not present

## 2022-11-16 DIAGNOSIS — R2681 Unsteadiness on feet: Secondary | ICD-10-CM | POA: Diagnosis not present

## 2022-11-16 DIAGNOSIS — M6281 Muscle weakness (generalized): Secondary | ICD-10-CM | POA: Diagnosis not present

## 2022-11-17 NOTE — Progress Notes (Signed)
EPIC Encounter for ICM Monitoring  Patient Name: Grace Robinson is a 89 y.o. female Date: 11/17/2022 Primary Care Physican: Shon Baton, MD Primary Cardiologist: Caryl Comes Electrophysiologist: Vergie Living Pacing:  96.7%     Last Office Weight: 141 lbs   Since 04-Oct-2022 Monitored VT (>4 beats) 3 Time in AT/AF <0.1 hr/day (<0.1%)           Spoke with patient and heart failure questions reviewed.  Transmission results reviewed.  Pt asymptomatic for fluid accumulation.  Reports feeling well at this time and voices no complaints.     Optivol Thoracic impedance suggesting intermittent days with possible fluid accumulation within the last month.      Prescribed:  Furosemide 20 mg take 1 tablet (20 mg total) by mouth daily alternating (40 mg) every other day.   Potassium 10 mEq 1 tablet twice a day.   Labs: 08/02/2022 Creatinine 0.89, BUN 15, Potassium 3.8, Sodium 144, GFR 60 A complete set of results can be found in Results Review.   Recommendations:  No changes and encouraged to call if experiencing any fluid symptoms.   Follow-up plan: ICM clinic phone appointment on 12/21/2022.   91 day device clinic remote transmission 12/01/2022.      EP/Cardiology Office Visits:  12/07/2022 with Dr Caryl Comes.     Copy of ICM check sent to Dr. Caryl Comes.   3 month ICM trend: 11/14/2022.    12-14 Month ICM trend:     [name], RN 11/17/2022 2:16 PM

## 2022-11-22 DIAGNOSIS — N6311 Unspecified lump in the right breast, upper outer quadrant: Secondary | ICD-10-CM | POA: Diagnosis not present

## 2022-11-22 DIAGNOSIS — R921 Mammographic calcification found on diagnostic imaging of breast: Secondary | ICD-10-CM | POA: Diagnosis not present

## 2022-11-28 ENCOUNTER — Other Ambulatory Visit: Payer: Self-pay | Admitting: Physician Assistant

## 2022-11-29 DIAGNOSIS — R2681 Unsteadiness on feet: Secondary | ICD-10-CM | POA: Diagnosis not present

## 2022-11-29 DIAGNOSIS — M6281 Muscle weakness (generalized): Secondary | ICD-10-CM | POA: Diagnosis not present

## 2022-12-01 ENCOUNTER — Ambulatory Visit: Payer: Medicare Other

## 2022-12-01 ENCOUNTER — Other Ambulatory Visit: Payer: Self-pay

## 2022-12-01 DIAGNOSIS — I428 Other cardiomyopathies: Secondary | ICD-10-CM

## 2022-12-01 DIAGNOSIS — D0511 Intraductal carcinoma in situ of right breast: Secondary | ICD-10-CM | POA: Diagnosis not present

## 2022-12-01 LAB — CUP PACEART REMOTE DEVICE CHECK
Battery Remaining Longevity: 142 mo
Battery Voltage: 3.19 V
Brady Statistic AP VP Percent: 36.66 %
Brady Statistic AP VS Percent: 0.57 %
Brady Statistic AS VP Percent: 59.25 %
Brady Statistic AS VS Percent: 3.52 %
Brady Statistic RA Percent Paced: 37.4 %
Brady Statistic RV Percent Paced: 1.08 %
Date Time Interrogation Session: 20240130222256
Implantable Lead Connection Status: 753985
Implantable Lead Connection Status: 753985
Implantable Lead Connection Status: 753985
Implantable Lead Connection Status: 753985
Implantable Lead Implant Date: 20031007
Implantable Lead Implant Date: 20031007
Implantable Lead Implant Date: 20031007
Implantable Lead Implant Date: 20060227
Implantable Lead Location: 753858
Implantable Lead Location: 753859
Implantable Lead Location: 753860
Implantable Lead Location: 753860
Implantable Lead Model: 4193
Implantable Lead Model: 4469
Implantable Lead Model: 4470
Implantable Lead Model: 6949
Implantable Lead Serial Number: 337988
Implantable Lead Serial Number: 361236
Implantable Pulse Generator Implant Date: 20231030
Lead Channel Impedance Value: 3363 Ohm
Lead Channel Impedance Value: 3363 Ohm
Lead Channel Impedance Value: 3363 Ohm
Lead Channel Impedance Value: 361 Ohm
Lead Channel Impedance Value: 361 Ohm
Lead Channel Impedance Value: 418 Ohm
Lead Channel Impedance Value: 513 Ohm
Lead Channel Impedance Value: 570 Ohm
Lead Channel Impedance Value: 646 Ohm
Lead Channel Pacing Threshold Amplitude: 0.625 V
Lead Channel Pacing Threshold Amplitude: 1.25 V
Lead Channel Pacing Threshold Pulse Width: 0.4 ms
Lead Channel Pacing Threshold Pulse Width: 0.4 ms
Lead Channel Sensing Intrinsic Amplitude: 1.375 mV
Lead Channel Sensing Intrinsic Amplitude: 1.375 mV
Lead Channel Sensing Intrinsic Amplitude: 31.625 mV
Lead Channel Sensing Intrinsic Amplitude: 31.625 mV
Lead Channel Setting Pacing Amplitude: 1.5 V
Lead Channel Setting Pacing Amplitude: 1.5 V
Lead Channel Setting Pacing Amplitude: 2.5 V
Lead Channel Setting Pacing Pulse Width: 0.4 ms
Lead Channel Setting Pacing Pulse Width: 0.8 ms
Lead Channel Setting Sensing Sensitivity: 1.2 mV
Zone Setting Status: 755011

## 2022-12-03 ENCOUNTER — Telehealth: Payer: Self-pay | Admitting: Hematology and Oncology

## 2022-12-03 NOTE — Telephone Encounter (Signed)
Spoke to patient to confirm upcoming morning [facility] clinic appointment on 2/7, paperwork will be sent via [place].   Gave location and time, also informed patient that the surgeon's office would be calling as well to get information from them similar to the packet that they will be receiving so make sure to do both.  Reminded patient that all providers will be coming to the clinic to see them HERE and if they had any questions to not hesitate to reach back out to myself or their navigators.

## 2022-12-06 ENCOUNTER — Encounter: Payer: Self-pay | Admitting: *Deleted

## 2022-12-06 DIAGNOSIS — R2681 Unsteadiness on feet: Secondary | ICD-10-CM | POA: Diagnosis not present

## 2022-12-06 DIAGNOSIS — M6281 Muscle weakness (generalized): Secondary | ICD-10-CM | POA: Diagnosis not present

## 2022-12-06 DIAGNOSIS — D0511 Intraductal carcinoma in situ of right breast: Secondary | ICD-10-CM | POA: Insufficient documentation

## 2022-12-06 NOTE — Progress Notes (Signed)
Radiation Oncology         [phone]) [phone] ________________________________  Name: Grace Robinson        MRN: [id number]  Date of Service: 12/08/2022 DOB: January 12, 1926  OA:[id number], Grace Reichmann, MD  Grace Keens, MD     REFERRING PHYSICIAN: Coralie Keens, MD   DIAGNOSIS: The encounter diagnosis was Ductal carcinoma in situ (DCIS) of right breast.   HISTORY OF PRESENT ILLNESS: Grace Robinson is a 89 y.o. female seen in the multidisciplinary breast clinic for a new diagnosis of right breast cancer. The patient was noted to have nipple retraction and diagnostic imaging of her right breast showed a group of calcifications in the upper outer breast measuring up to 2.7 cm. An ultrasound also noted a mass in the 9:30 position measuring up to 6 mm and her axilla was negative for adenopathy. A biopsy showed high grade DCIS with necrosis and calcifications that was ER weakly positive, PR negative. She's seen today to discuss treatment recommendations of her cancer.     PREVIOUS RADIATION THERAPY: No   PAST MEDICAL HISTORY:  Past Medical History:  Diagnosis Date   Biventricular ICD (implantable cardiac defibrillator) in place    Cellulitis    Change in bowel habits    CHF (congestive heart failure) (HCC)    Colon polyps    Diabetes mellitus    Type II   Diarrhea    Dyslipidemia    HOH (hard of hearing)    left   HTN (hypertension)    Ischemic cardiomyopathy    LBBB (left bundle branch block)    chronic   Meniere's disease    T wave oversensing 01/17/2014   Ulcerative colitis    Varicose veins    4 lazer  treatment each leg       PAST SURGICAL HISTORY: Past Surgical History:  Procedure Laterality Date   ABDOMINAL HYSTERECTOMY     BIV PACEMAKER GENERATOR CHANGEOUT N/A 08/30/2022   Procedure: BIV PACEMAKER GENERATOR CHANGEOUT;  Surgeon: Deboraha Sprang, MD;  Location: [place] [facility];  Service: Cardiovascular;  Laterality: N/A;   CARDIAC CATHETERIZATION  08/2011   CHOLECYSTECTOMY      COLONOSCOPY     EP Study  08/22/08   IMPLANTABLE CARDIOVERTER DEFIBRILLATOR GENERATOR CHANGE N/A 08/22/2013   Procedure: IMPLANTABLE CARDIOVERTER DEFIBRILLATOR GENERATOR CHANGE;  Surgeon: Deboraha Sprang, MD;  Location: [facility] C[facility];  Service: Cardiovascular;  Laterality: N/A;   KNEE ARTHROSCOPY Left 04/14/2016   Procedure: LEFT KNEE ARTHROSCOPY, PARTIAL MEDIAL AND PARTIAL LATERAL MENISCECTOMY, MEDIAL PLICA;  Surgeon: Dorna Leitz, MD;  Location: [place];  Service: Orthopedics;  Laterality: Left;   mastoid sugery     in the setting of Meniere's disease   TONSILLECTOMY AND ADENOIDECTOMY       FAMILY HISTORY:  Family History  Problem Relation Age of Onset   Emphysema Father 105   Heart disease Father    Breast cancer Mother    Lung cancer Mother    Healthy Child    Colon cancer Neg Hx    Rectal cancer Neg Hx    Stomach cancer Neg Hx    Liver cancer Neg Hx    Esophageal cancer Neg Hx      SOCIAL HISTORY:  reports that she has quit smoking. Her smoking use included cigarettes. She has never been exposed to tobacco smoke. She has never used smokeless tobacco. She reports that she does not drink alcohol and does not use drugs. The patient is single and  resides in [place]. She is a retired Education officer, museum. She is accompanied by her son Grace Robinson who also lives nearby. She enjoys reading, and is doing physical therapy for balance given her meniere's disease, but outside of that feels like she can get around quite well.    ALLERGIES: Carvedilol, Digoxin, Vancomycin, and Sudafed [pseudoephedrine]   MEDICATIONS:  Current Outpatient Medications  Medication Sig Dispense Refill   acetaminophen (TYLENOL) 500 MG tablet Take 1,000 mg by mouth every 8 (eight) hours as needed for moderate pain.     aspirin EC 81 MG tablet Take 81 mg by mouth daily. Swallow whole.     atorvastatin (LIPITOR) 40 MG tablet Take 40 mg by mouth daily.  3   Cholecalciferol (VITAMIN D-3) 25 MCG (1000 UT) CAPS Take 2,000 Units  by mouth daily.     cholestyramine (QUESTRAN) 4 g packet Take 1 packet (4 g total) by mouth daily. 90 each 2   CRANBERRY CONCENTRATE PO Take 1 tablet by mouth daily.     dicyclomine (BENTYL) 10 MG capsule TAKE 1 CAPSULE BY MOUTH THREE TIMES DAILY 30 MINUTES BEFORE MEALS 90 capsule 2   furosemide (LASIX) 20 MG tablet Take 1 tablet (20 mg total) by mouth daily. alternating (40 mg) every other day. 60 tablet 11   HUMALOG MIX 75/25 (75-25) 100 UNIT/ML SUSP injection Inject 32 Units into the skin 2 (two) times daily with a meal.   1   Lancets (ONETOUCH DELICA PLUS [id number]) MISC CHECK BLOOD SUGAR TWICE DAILY     loperamide (IMODIUM A-D) 2 MG tablet Take 2 mg by mouth as needed for diarrhea or loose stools.     metoprolol succinate (TOPROL-XL) 100 MG 24 hr tablet Take 100 mg by mouth 2 (two) times daily.     olmesartan (BENICAR) 40 MG tablet Take 40 mg by mouth daily.  3   ONETOUCH VERIO test strip 2 (two) times daily.     potassium chloride (K-DUR) 10 MEQ tablet Take 10 mEq by mouth 2 (two) times daily.     vitamin B-12 (CYANOCOBALAMIN) 1000 MCG tablet Take 1,000 mcg by mouth daily.     vitamin E 400 UNIT capsule Take 400 Units by mouth daily.     No current facility-administered medications for this visit.     REVIEW OF SYSTEMS: On review of systems, the patient reports that she is doing well overall. Her balance is her main concern related to her meniere's disease. She reports she is not relying on her pacemaker and she has electronic transmissions to Dr. Olin Pia office.      PHYSICAL EXAM:  Wt Readings from Last 3 Encounters:  09/29/22 137 lb (62.1 kg)  08/30/22 137 lb (62.1 kg)  08/16/22 137 lb 9.6 oz (62.4 kg)   Temp Readings from Last 3 Encounters:  09/29/22 97.8 F (36.6 C) (Oral)  08/30/22 97.6 F (36.4 C) (Temporal)  04/29/22 98.3 F (36.8 C) (Temporal)   BP Readings from Last 3 Encounters:  09/29/22 (!) 191/89  08/30/22 (!) 156/63  08/16/22 135/86   Pulse Readings from  Last 3 Encounters:  09/29/22 84  08/30/22 90  08/16/22 76    In general this is a well appearing caucasian female who appears younger than her stated age in no acute distress. She's alert and oriented x4 and appropriate throughout the examination. Cardiopulmonary assessment is negative for acute distress and she exhibits normal effort. Bilateral breast exam is deferred.    ECOG = 1  0 - Asymptomatic (  Fully active, able to carry on all predisease activities without restriction)  1 - Symptomatic but completely ambulatory (Restricted in physically strenuous activity but ambulatory and able to carry out work of a light or sedentary nature. For example, light housework, office work)  2 - Symptomatic, <50% in bed during the day (Ambulatory and capable of all self care but unable to carry out any work activities. Up and about more than 50% of waking hours)  3 - Symptomatic, >50% in bed, but not bedbound (Capable of only limited self-care, confined to bed or chair 50% or more of waking hours)  4 - Bedbound (Completely disabled. Cannot carry on any self-care. Totally confined to bed or chair)  5 - Death   Eustace Pen MM, Creech RH, Tormey DC, et al. [phone]). "Toxicity and response criteria of the [facility]". [place] Oncol. 5 (6): 649-55    LABORATORY DATA:  Lab Results  Component Value Date   WBC 7.2 08/02/2022   HGB 13.8 08/02/2022   HCT 40.3 08/02/2022   MCV 95 08/02/2022   PLT 145 (L) 08/02/2022   Lab Results  Component Value Date   NA 144 08/02/2022   K 3.8 08/02/2022   CL 100 08/02/2022   CO2 26 08/02/2022   Lab Results  Component Value Date   ALT 18 11/10/2020   AST 21 11/10/2020   ALKPHOS 79 11/10/2020   BILITOT 0.6 11/10/2020      RADIOGRAPHY: CUP PACEART REMOTE DEVICE CHECK  Result Date: 12/01/2022 Scheduled remote reviewed. Normal device function.  3 NSVT, 4-8 beats in duration Next remote 91 days. LA      IMPRESSION/PLAN: 1. ER weakly  positive, PR negative DCIS of the right breast. Dr. Lisbeth Renshaw discusses the pathology findings and reviews the nature of non invasive right breast disease. The consensus from the breast conference includes breast conservation with lumpectomy. Dr. Lisbeth Renshaw discusses the rationale for external radiotherapy to the breast  to reduce risks of local recurrence followed by antiestrogen therapy. Dr. Lisbeth Renshaw also discusses cases in which radiation may be optional for favorable cases based on final pathology.  We will follow-up with her final results of surgery, but if recommended Dr. Lisbeth Renshaw would anticipate a course of 4 weeks of radiotherapy to the right breast. She is in agreement with this plan. 2. In Situ Pacemaker. She is not pacer dependent but we will coordinate with her cardiology team if she needed radiation. 3. Possible genetic predisposition to malignancy. The patient is a candidate for genetic testing given her personal and family history. She will meet with our geneticist today in clinic.   In a visit lasting 60s minutes, greater than 50% of the time was spent face to face reviewing her case, as well as in preparation of, discussing, and coordinating the patient's care.  The above documentation reflects my direct findings during this shared patient visit. Please see the separate note by Dr. Lisbeth Renshaw on this date for the remainder of the patient's plan of care.    Carola Rhine, [facility]    **Disclaimer: This note was dictated with voice recognition software. Similar sounding words can inadvertently be transcribed and this note may contain transcription errors which may not have been corrected upon publication of note.**

## 2022-12-07 ENCOUNTER — Encounter: Payer: Self-pay | Admitting: Internal Medicine

## 2022-12-07 ENCOUNTER — Ambulatory Visit: Payer: Medicare Other | Attending: Internal Medicine | Admitting: Internal Medicine

## 2022-12-07 VITALS — BP 130/76 | HR 77 | Ht 66.0 in | Wt 130.6 lb

## 2022-12-07 DIAGNOSIS — I472 Ventricular tachycardia, unspecified: Secondary | ICD-10-CM

## 2022-12-07 DIAGNOSIS — E785 Hyperlipidemia, unspecified: Secondary | ICD-10-CM | POA: Diagnosis not present

## 2022-12-07 DIAGNOSIS — I428 Other cardiomyopathies: Secondary | ICD-10-CM

## 2022-12-07 DIAGNOSIS — I2510 Atherosclerotic heart disease of native coronary artery without angina pectoris: Secondary | ICD-10-CM | POA: Diagnosis not present

## 2022-12-07 DIAGNOSIS — Z95810 Presence of automatic (implantable) cardiac defibrillator: Secondary | ICD-10-CM

## 2022-12-07 DIAGNOSIS — E1140 Type 2 diabetes mellitus with diabetic neuropathy, unspecified: Secondary | ICD-10-CM | POA: Diagnosis not present

## 2022-12-07 DIAGNOSIS — E559 Vitamin D deficiency, unspecified: Secondary | ICD-10-CM | POA: Diagnosis not present

## 2022-12-07 DIAGNOSIS — R5383 Other fatigue: Secondary | ICD-10-CM | POA: Diagnosis not present

## 2022-12-07 NOTE — Progress Notes (Unsigned)
D     Patient Care Team: Shon Baton, MD as PCP - General (Internal Medicine) Coralie Keens, MD as Consulting Physician (General Surgery) Benay Pike, MD as Consulting Physician (Hematology and Oncology) Kyung Rudd, MD as Consulting Physician (Radiation Oncology) Mauro Kaufmann, RN as Oncology Nurse Navigator Rockwell Germany, RN as Oncology Nurse Navigator   HPI  Grace Robinson is a 89 y.o. female is seen in followup for nonischemic cardiomyopathy complicated by congestive heart failure. She is status post CRT-D implantation was further compromised by a 6949-lead which failed prompting [place] to pirate her previously implanted pacemaker lead . This was done in 2007  There have been intercurrent normalization of LV functionwith an echo 2014 demonstrating near normal left ventricular function and mild AV valvular regurgitation  this was confirmed 9/15; her device reached ERI, we elected to not replace high-voltage therapy given her age and her peace with her dying, we also chose to replace low voltage therapy as there has been long-term normalization of LV function  Intercurrently she has been diagnosed with breast cancer.  She meets tomorrow with multidisciplinary team.  Anxious.  No chest pain.  No shortness of breath no edema     DATE TEST EF   10/03 Echo  20-25%   6/09 Echo   55-65 %   /2/14 Echo   50-55 %              Date Cr K Hgb  1/22 1.08 4.0 14.6   10/23 0.89 3.8 13.8        Past Medical History:  Diagnosis Date   Biventricular ICD (implantable cardiac defibrillator) in place    Cellulitis    Change in bowel habits    CHF (congestive heart failure) ([place])    Colon polyps    Diabetes mellitus    Type II   Diarrhea    Dyslipidemia    HOH (hard of hearing)    left   HTN (hypertension)    Ischemic cardiomyopathy    LBBB (left bundle branch block)    chronic   Meniere's disease    T wave oversensing 01/17/2014   Ulcerative colitis    Varicose  veins    4 lazer  treatment each leg    Past Surgical History:  Procedure Laterality Date   ABDOMINAL HYSTERECTOMY     BIV PACEMAKER GENERATOR CHANGEOUT N/A 08/30/2022   Procedure: BIV PACEMAKER GENERATOR CHANGEOUT;  Surgeon: Deboraha Sprang, MD;  Location: [place] [facility];  Service: Cardiovascular;  Laterality: N/A;   CARDIAC CATHETERIZATION  08/2011   CHOLECYSTECTOMY     COLONOSCOPY     EP Study  08/22/08   IMPLANTABLE CARDIOVERTER DEFIBRILLATOR GENERATOR CHANGE N/A 08/22/2013   Procedure: IMPLANTABLE CARDIOVERTER DEFIBRILLATOR GENERATOR CHANGE;  Surgeon: Deboraha Sprang, MD;  Location: [facility] CATH LAB;  Service: Cardiovascular;  Laterality: N/A;   KNEE ARTHROSCOPY Left 04/14/2016   Procedure: LEFT KNEE ARTHROSCOPY, PARTIAL MEDIAL AND PARTIAL LATERAL MENISCECTOMY, MEDIAL PLICA;  Surgeon: Dorna Leitz, MD;  Location: [place];  Service: Orthopedics;  Laterality: Left;   mastoid sugery     in the setting of Meniere's disease   TONSILLECTOMY AND ADENOIDECTOMY      Current Outpatient Medications  Medication Sig Dispense Refill   acetaminophen (TYLENOL) 500 MG tablet Take 1,000 mg by mouth every 8 (eight) hours as needed for moderate pain.     aspirin EC 81 MG tablet Take 81 mg by mouth daily. Swallow whole.  atorvastatin (LIPITOR) 40 MG tablet Take 40 mg by mouth daily.  3   Cholecalciferol (VITAMIN D-3) 25 MCG (1000 UT) CAPS Take 2,000 Units by mouth daily.     cholestyramine (QUESTRAN) 4 g packet Take 1 packet (4 g total) by mouth daily. 90 each 2   CRANBERRY CONCENTRATE PO Take 1 tablet by mouth daily.     dicyclomine (BENTYL) 10 MG capsule TAKE 1 CAPSULE BY MOUTH THREE TIMES DAILY 30 MINUTES BEFORE MEALS 90 capsule 2   furosemide (LASIX) 20 MG tablet Take 1 tablet (20 mg total) by mouth daily. alternating (40 mg) every other day. 60 tablet 11   HUMALOG MIX 75/25 (75-25) 100 UNIT/ML SUSP injection Inject 32 Units into the skin 2 (two) times daily with a meal.   1   Lancets (ONETOUCH  DELICA PLUS [id number]) MISC CHECK BLOOD SUGAR TWICE DAILY     loperamide (IMODIUM A-D) 2 MG tablet Take 2 mg by mouth as needed for diarrhea or loose stools.     metoprolol succinate (TOPROL-XL) 100 MG 24 hr tablet Take 100 mg by mouth 2 (two) times daily.     olmesartan (BENICAR) 40 MG tablet Take 40 mg by mouth daily.  3   ONETOUCH VERIO test strip 2 (two) times daily.     potassium chloride (K-DUR) 10 MEQ tablet Take 10 mEq by mouth 2 (two) times daily.     vitamin B-12 (CYANOCOBALAMIN) 1000 MCG tablet Take 1,000 mcg by mouth daily.     vitamin E 400 UNIT capsule Take 400 Units by mouth daily.     No current facility-administered medications for this visit.    Allergies  Allergen Reactions   Carvedilol Other (See Comments)    Pt does not remember reaction    Digoxin     Dig toxicity   Vancomycin Other (See Comments)    ("Red Man Syndrome") Pt states she turned red and was itching    Sudafed [Pseudoephedrine] Other (See Comments)    Makes skin feel like its crawling    Review of Systems negative except from HPI and PMH  Physical Exam BP 130/76   Pulse 77   Ht '5\' 6"'$  (1.676 m)   Wt 130 lb 9.6 oz (59.2 kg)   SpO2 99%   BMI 21.08 kg/m  Well developed and well nourished in no acute distress HENT normal Neck supple with JVP-flat Clear Device pocket well healed; without hematoma or erythema.  There is no tethering  Regular rate and rhythm, no  murmur Abd-soft with active BS No Clubbing cyanosis  edema Skin-warm and dry A & Oriented  Grossly normal sensory and motor function  AV pacing at 77 negative QRS lead I and lead V1 \ Assessment and  Plan  Nonischemic cardiomyopathy -resolved  Congestive heart failure ? HFpEF  CRT-P  [employer]tr[employer]  hyperension   Ventricular tachycardia  SCAF  End-of-life discussion   Functional status is stable.  Is glad to have made the decision for CRT-P  Unfortunately, intercurrent diagnosis of breast cancer, multidisciplinary team  meeting tomorrow, discussed some of the concerns that she has regarding treatments, mode of dying,  No intercurrent ventricular arrhythmias.

## 2022-12-07 NOTE — Patient Instructions (Signed)
Medication Instructions:  Your physician recommends that you continue on your current medications as directed. Please refer to the Current Medication list given to you today.  *If you need a refill on your cardiac medications before your next appointment, please call your pharmacy*   Lab Work: None ordered.  If you have labs (blood work) drawn today and your tests are completely normal, you will receive your results only by: [place] (if you have MyChart) OR A paper copy in the mail If you have any lab test that is abnormal or we need to change your treatment, we will call you to review the results.   Testing/Procedures: None ordered.    Follow-Up: At [facility], you and your health needs are our priority.  As part of our continuing mission to provide you with exceptional heart care, we have created designated Provider Care Teams.  These Care Teams include your primary Cardiologist (physician) and Advanced Practice Providers (APPs -  Physician Assistants and Nurse Practitioners) who all work together to provide you with the care you need, when you need it.  We recommend signing up for the patient portal called "MyChart".  Sign up information is provided on this After Visit Summary.  MyChart is used to connect with patients for Virtual Visits (Telemedicine).  Patients are able to view lab/test results, encounter notes, upcoming appointments, etc.  Non-urgent messages can be sent to your provider as well.   To learn more about what you can do with MyChart, go to [email or web address].    Your next appointment:   9 months with Dr Caryl Comes

## 2022-12-08 ENCOUNTER — Other Ambulatory Visit: Payer: Self-pay | Admitting: Surgery

## 2022-12-08 ENCOUNTER — Encounter: Payer: Self-pay | Admitting: Hematology and Oncology

## 2022-12-08 ENCOUNTER — Inpatient Hospital Stay (HOSPITAL_BASED_OUTPATIENT_CLINIC_OR_DEPARTMENT_OTHER): Payer: Medicare Other

## 2022-12-08 ENCOUNTER — Ambulatory Visit: Payer: Medicare Other | Admitting: Physical Therapy

## 2022-12-08 ENCOUNTER — Telehealth: Payer: Self-pay | Admitting: Genetic Counselor

## 2022-12-08 ENCOUNTER — Inpatient Hospital Stay: Payer: Medicare Other | Attending: Hematology and Oncology | Admitting: Hematology and Oncology

## 2022-12-08 ENCOUNTER — Other Ambulatory Visit: Payer: Self-pay

## 2022-12-08 ENCOUNTER — Telehealth: Payer: Self-pay | Admitting: *Deleted

## 2022-12-08 ENCOUNTER — Ambulatory Visit
Admission: RE | Admit: 2022-12-08 | Discharge: 2022-12-08 | Disposition: A | Discharge status: Home / Self Care | Payer: Medicare Other | Source: Ambulatory Visit | Attending: Radiation Oncology | Admitting: Radiation Oncology

## 2022-12-08 ENCOUNTER — Inpatient Hospital Stay: Payer: Medicare Other | Admitting: Licensed Clinical Social Worker

## 2022-12-08 VITALS — BP 164/94 | HR 100 | Temp 97.9°F | Resp 16 | Ht 66.0 in | Wt 127.9 lb

## 2022-12-08 DIAGNOSIS — D0511 Intraductal carcinoma in situ of right breast: Secondary | ICD-10-CM

## 2022-12-08 DIAGNOSIS — Z170 Estrogen receptor positive status [ER+]: Secondary | ICD-10-CM | POA: Diagnosis not present

## 2022-12-08 LAB — CBC WITH DIFFERENTIAL (CANCER CENTER ONLY)
Abs Immature Granulocytes: 0.01 10*3/uL (ref 0.00–0.07)
Basophils Absolute: 0 10*3/uL (ref 0.0–0.1)
Basophils Relative: 0 %
Eosinophils Absolute: 0.1 10*3/uL (ref 0.0–0.5)
Eosinophils Relative: 1 %
HCT: 42.2 % (ref 36.0–46.0)
Hemoglobin: 14.5 g/dL (ref 12.0–15.0)
Immature Granulocytes: 0 %
Lymphocytes Relative: 16 %
Lymphs Abs: 1 10*3/uL (ref 0.7–4.0)
MCH: 32.2 pg (ref 26.0–34.0)
MCHC: 34.4 g/dL (ref 30.0–36.0)
MCV: 93.6 fL (ref 80.0–100.0)
Monocytes Absolute: 0.5 10*3/uL (ref 0.1–1.0)
Monocytes Relative: 9 %
Neutro Abs: 4.4 10*3/uL (ref 1.7–7.7)
Neutrophils Relative %: 74 %
Platelet Count: 153 10*3/uL (ref 150–400)
RBC: 4.51 MIL/uL (ref 3.87–5.11)
RDW: 12.9 % (ref 11.5–15.5)
WBC Count: 5.9 10*3/uL (ref 4.0–10.5)
nRBC: 0 % (ref 0.0–0.2)

## 2022-12-08 LAB — CMP (CANCER CENTER ONLY)
ALT: 13 U/L (ref 0–44)
AST: 19 U/L (ref 15–41)
Albumin: 4 g/dL (ref 3.5–5.0)
Alkaline Phosphatase: 67 U/L (ref 38–126)
Anion gap: 11 (ref 5–15)
BUN: 15 mg/dL (ref 8–23)
CO2: 27 mmol/L (ref 22–32)
Calcium: 9 mg/dL (ref 8.9–10.3)
Chloride: 102 mmol/L (ref 98–111)
Creatinine: 1.02 mg/dL — ABNORMAL HIGH (ref 0.44–1.00)
GFR, Estimated: 51 mL/min — ABNORMAL LOW (ref >60)
Glucose, Bld: 185 mg/dL — ABNORMAL HIGH (ref 70–99)
Potassium: 3.3 mmol/L — ABNORMAL LOW (ref 3.5–5.1)
Sodium: 140 mmol/L (ref 135–145)
Total Bilirubin: 0.8 mg/dL (ref 0.3–1.2)
Total Protein: 6.6 g/dL (ref 6.5–8.1)

## 2022-12-08 LAB — GENETIC SCREENING ORDER

## 2022-12-08 NOTE — Telephone Encounter (Signed)
   Patient Name: Grace Robinson  DOB: 01/18/1926 MRN: [id number]  Primary Cardiologist: None  Chart reviewed as part of pre-operative protocol coverage.   Patient takes aspirin for history of TIA.  Therefore, recommendations for holding aspirin prior to procedure should come from managing provider (PCP).   I will route this recommendation to the requesting party via Epic fax function and remove from pre-op pool.  Please call with questions.  Lenna Sciara, NP 12/08/2022, 11:56 AM

## 2022-12-08 NOTE — Telephone Encounter (Signed)
Grace Robinson was seen by a genetic counselor during the breast multidisciplinary clinic on 12/08/2022. In addition to her personal history of breast cancer, she reported her mother had a history of breast cancer diagnosed at age 87 and one maternal cousin had a history of lung cancer (she smoked). Grace Robinson does not meet NCCN criteria for genetic testing at this time. She was still offered genetic counseling and testing but declined. We encourage her to contact us if there are any changes to her personal or family history of cancer. If she meets NCCN criteria based on the updated personal/family history, she would be recommended to have genetic counseling and testing.   Lucille Passy, MS, [facility] Genetic Counselor [email or web address] (P) [phone]

## 2022-12-08 NOTE — Progress Notes (Signed)
[place] Clinical Social Work  Initial Assessment   Grace Robinson is a 89 y.o. year old female accompanied by son, Grace Robinson. Clinical Social Work was referred by  [facility]  for assessment of psychosocial needs.   SDOH (Social Determinants of Health) assessments performed: Yes SDOH Interventions    Flowsheet Row Clinical Support from 12/08/2022 in [place] at [facility]  SDOH Interventions   Food Insecurity Interventions Intervention Not Indicated  Housing Interventions Intervention Not Indicated  Transportation Interventions Intervention Not Indicated  Utilities Interventions Intervention Not Indicated  Financial Strain Interventions Intervention Not Indicated       SDOH Screenings   Food Insecurity: No Food Insecurity (12/08/2022)  Housing: Low Risk  (12/08/2022)  Transportation Needs: No Transportation Needs (12/08/2022)  Utilities: Not At Risk (12/08/2022)  Financial Resource Strain: Low Risk  (12/08/2022)  Tobacco Use: Medium Risk (12/08/2022)     Distress Screen completed: Yes    12/08/2022   11:21 AM  ONCBCN DISTRESS SCREENING  Screening Type Initial Screening  Distress experienced in past week (1-10) 7  Emotional problem type Nervousness/Anxiety  Information Concerns Type Lack of info about diagnosis  Physical Problem type Getting around;Loss of appetitie;Constipation/diarrhea      Family/Social Information:  Housing Arrangement: patient lives alone at [name] (independent living) Family members/support persons in your life? Family- son and 2 grandkids live locally and Friends Transportation concerns: no  Employment: Retired .  Income source: Paediatric nurse concerns: No Type of concern: None Food access concerns: no Religious or spiritual practice: Not known Services Currently in place:    Coping/ Adjustment to diagnosis: Patient understands treatment plan and what happens next? yes, feels much better after meeting with  medical team Patient reported stressors: Adjusting to my illness Current coping skills/ strengths: Capable of independent living  and Supportive family/friends     SUMMARY: Current SDOH Barriers:  No significant SDOH needs identified today  Clinical Social Work Clinical Goal(s):  No clinical social work goals at this time  Interventions: Discussed common feeling and emotions when being diagnosed with cancer, and the importance of support during treatment Informed patient of the support team roles and support services at [name] Provided Grace Robinson contact information and encouraged patient to call with any questions or concerns   Follow Up Plan: Patient will contact CSW with any support or resource needs Patient verbalizes understanding of plan: Yes    Rolland Steinert E Whitnee Orzel, LCSW

## 2022-12-08 NOTE — Assessment & Plan Note (Addendum)
This is a very pleasant 89 year old female patient with past medical history significant for coronary artery disease, congestive heart failure, type 2 diabetes referred to oncology for evaluation and management of DCIS.  Pathology review: I discussed with the patient the difference between DCIS and invasive breast cancer. It is considered a precancerous lesion. DCIS is classified as a Stage 0 breast cancer. It is generally detected through mammograms as calcifications. We discussed the significance of grades and its impact on prognosis. We also discussed the importance of ER and PR receptors and their implications to adjuvant treatment options. Prognosis of DCIS dependence on grade and degree of comedo necrosis. It is anticipated that if not treated, 20-30% of DCIS can develop into invasive breast cancer.  Recommendation: 1. Breast conserving surgery 2. Followed by adjuvant radiation therapy 3. Followed by antiestrogen therapy with tamoxifen/aromatase inhibitors based on menopausal status 5 years  Tamoxifen counseling: We discussed the risks and benefits of tamoxifen. These include but not limited to insomnia, hot flashes, mood changes, vaginal dryness, and weight gain. Although rare, serious side effects including endometrial cancer, risk of blood clots were also discussed. We strongly believe that the benefits far outweigh the risks. Patient understands these risks and consented to starting treatment. Planned treatment duration is 5 years.  Aromatase inhibitors counseling: We have discussed the mechanism of action of aromatase inhibitors today.  We have discussed adverse effects including but not limited to menopausal symptoms, increased risk of osteoporosis and fractures, cardiovascular events, arthralgias and myalgias.  We do believe that the benefits far outweigh the risks.  Plan treatment duration of 5 years.  Given weak ER staining and her advanced age, we discussed about observation after  surgery since her benefit from antiestrogen therapy will likely be minimal.  She is very pleased with this plan.

## 2022-12-08 NOTE — Telephone Encounter (Signed)
   Pre-operative Risk Assessment    Patient Name: Grace Robinson  DOB: January 15, 1926 MRN: [id number]      Request for Surgical Clearance    Procedure:   RIGHT BREAST RADIOACTIVE SEED GUIDED BRACKETED LUMPECTOMY  Date of Surgery:  Clearance TBD                                 Surgeon:  Coralie Keens, MD Surgeon's Group or Practice Name:  [facility] Phone number:  [phone] Fax number:  [phone]   Type of Clearance Requested:   - Pharmacy:  Hold Aspirin NOT INDICATED HOW LONG   Type of Anesthesia:  General    Additional requests/questions:    Astrid Divine   12/08/2022, 11:32 AM

## 2022-12-08 NOTE — Progress Notes (Signed)
[facility] NOTE  Patient Care Team: Shon Baton, MD as PCP - General (Internal Medicine) Coralie Keens, MD as Consulting Physician (General Surgery) Benay Pike, MD as Consulting Physician (Hematology and Oncology) Kyung Rudd, MD as Consulting Physician (Radiation Oncology) Mauro Kaufmann, RN as Oncology Nurse Navigator Rockwell Germany, RN as Oncology Nurse Navigator  CHIEF COMPLAINTS/PURPOSE OF CONSULTATION:  Newly diagnosed breast cancer  HISTORY OF PRESENTING ILLNESS:  Grace Robinson 89 y.o. female is here because of recent diagnosis of right breast DCIS  I reviewed her records extensively and collaborated the history with the patient.  SUMMARY OF ONCOLOGIC HISTORY: Oncology History  Ductal carcinoma in situ (DCIS) of right breast  11/22/2022 Mammogram   1.7 * 5.8 cm* 3.4 segmental to borderline pleomorphic calcs in the right breast highly suggestive of malignancy. Stereotactic biopsy recommended.   12/01/2022 Pathology Results   DCIS, high grade,  ER 10% pos weak staining, PR neg   12/06/2022 Initial Diagnosis   Ductal carcinoma in situ (DCIS) of right breast   12/08/2022 Cancer Staging   Staging form: Breast, AJCC 8th Edition - Clinical: Stage 0 (cTis (DCIS), cN0, cM0, G2, ER+, PR-, HER2-) - Signed by Benay Pike, MD on 12/08/2022 Stage prefix: Initial diagnosis Histologic grading system: 3 grade system    Interval History  Patient arrived to the appointment today with her son.  She is healthy at baseline except for history of congestive heart failure although she says that her cardiologist is quite pleased with the most recent workup.  She also has type 2 diabetes mellitus controlled on insulin, last hemoglobin A1c 7%. She was understandably very apprehensive about the visit today.  Her activities are mostly limited by her Mnire's disease which gives her balance issues and hearing loss. Rest of the pertinent 10 point ROS reviewed and  negative   MEDICAL HISTORY:  Past Medical History:  Diagnosis Date   Biventricular ICD (implantable cardiac defibrillator) in place    Breast cancer ([facility])    Cellulitis    Change in bowel habits    CHF (congestive heart failure) (HCC)    Colon polyps    Diabetes mellitus    Type II   Diarrhea    Dyslipidemia    HOH (hard of hearing)    left   HTN (hypertension)    Ischemic cardiomyopathy    LBBB (left bundle branch block)    chronic   Meniere's disease    T wave oversensing 01/17/2014   Ulcerative colitis    Varicose veins    4 lazer  treatment each leg    SURGICAL HISTORY: Past Surgical History:  Procedure Laterality Date   ABDOMINAL HYSTERECTOMY     BIV PACEMAKER GENERATOR CHANGEOUT N/A 08/30/2022   Procedure: BIV PACEMAKER GENERATOR CHANGEOUT;  Surgeon: Deboraha Sprang, MD;  Location: [facility];  Service: Cardiovascular;  Laterality: N/A;   CARDIAC CATHETERIZATION  08/2011   CHOLECYSTECTOMY     COLONOSCOPY     EP Study  08/22/08   IMPLANTABLE CARDIOVERTER DEFIBRILLATOR GENERATOR CHANGE N/A 08/22/2013   Procedure: IMPLANTABLE CARDIOVERTER DEFIBRILLATOR GENERATOR CHANGE;  Surgeon: Deboraha Sprang, MD;  Location: [facility] CATH [facility];  Service: Cardiovascular;  Laterality: N/A;   KNEE ARTHROSCOPY Left 04/14/2016   Procedure: LEFT KNEE ARTHROSCOPY, PARTIAL MEDIAL AND PARTIAL LATERAL MENISCECTOMY, MEDIAL PLICA;  Surgeon: Dorna Leitz, MD;  Location: [facility];  Service: Orthopedics;  Laterality: Left;   mastoid sugery     in the setting of  Meniere's disease   TONSILLECTOMY AND ADENOIDECTOMY      SOCIAL HISTORY: Social History   Socioeconomic History   Marital status: Single    Spouse name: Not on file   Number of children: 2   Years of education: Not on file   Highest education level: Bachelor's degree (e.g., BA, AB, BS)  Occupational History   Occupation: retired    Comment: Pharmacist, 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    Social Determinants of Health   Financial Resource Strain: Low Risk  (12/08/2022)   Overall Financial Resource Strain (CARDIA)    Difficulty of Paying Living Expenses: Not hard at all  Food Insecurity: No Food Insecurity (12/08/2022)   Hunger Vital Sign    Worried About Running Out of Food in the Last Year: Never true    Ran Out of Food in the Last Year: Never true  Transportation Needs: No Transportation Needs (12/08/2022)   PRAPARE - Hydrologist (Medical): No    Lack of Transportation (Non-Medical): No  Physical Activity: Not on file  Stress: Not on file  Social Connections: Not on file  Intimate Partner Violence: Not on file    FAMILY HISTORY: Family History  Problem Relation Age of Onset   Breast cancer Mother 27       met to lungs   Emphysema Father 84   Heart disease Father    Healthy Child    Lung cancer Cousin        maternal first cousin, she smoked   Colon cancer Neg Hx    Rectal cancer Neg Hx    Stomach cancer Neg Hx    Liver cancer Neg Hx    Esophageal cancer Neg Hx     ALLERGIES:  is allergic to carvedilol, digoxin, vancomycin, and sudafed [pseudoephedrine].  MEDICATIONS:  Current Outpatient Medications  Medication Sig Dispense Refill   acetaminophen (TYLENOL) 500 MG tablet Take 1,000 mg by mouth every 8 (eight) hours as needed for moderate pain.     aspirin EC 81 MG tablet Take 81 mg by mouth daily. Swallow whole.     atorvastatin (LIPITOR) 40 MG tablet Take 40 mg by mouth daily.  3   Cholecalciferol (VITAMIN D-3) 25 MCG (1000 UT) CAPS Take 2,000 Units by  mouth daily.     cholestyramine (QUESTRAN) 4 g packet Take 1 packet (4 g total) by mouth daily. 90 each 2   CRANBERRY CONCENTRATE PO Take 1 tablet by mouth daily.     dicyclomine (BENTYL) 10 MG capsule TAKE 1 CAPSULE BY MOUTH THREE TIMES DAILY 30 MINUTES BEFORE MEALS 90 capsule 2   furosemide (LASIX) 20 MG tablet Take 1 tablet (20 mg total) by mouth daily. alternating (40 mg) every other day. 60 tablet 11   HUMALOG MIX 75/25 (75-25) 100 UNIT/ML SUSP injection Inject 32 Units into the skin 2 (two) times daily with a meal.   1   Lancets [facility]  PLUS LANCET30G) MISC CHECK BLOOD SUGAR TWICE DAILY     loperamide (IMODIUM A-D) 2 MG tablet Take 2 mg by mouth as needed for diarrhea or loose stools.     metoprolol succinate (TOPROL-XL) 100 MG 24 hr tablet Take 100 mg by mouth 2 (two) times daily.     olmesartan (BENICAR) 40 MG tablet Take 40 mg by mouth daily.  3   ONETOUCH VERIO test strip 2 (two) times daily.     potassium chloride (K-DUR) 10 MEQ tablet Take 10 mEq by mouth 2 (two) times daily.     vitamin B-12 (CYANOCOBALAMIN) 1000 MCG tablet Take 1,000 mcg by mouth daily.     vitamin E 400 UNIT capsule Take 400 Units by mouth daily.     No current facility-administered medications for this visit.    REVIEW OF SYSTEMS:   Constitutional: Denies fevers, chills or abnormal night sweats Eyes: Denies blurriness of vision, double vision or watery eyes Ears, nose, mouth, throat, and face: Denies mucositis or sore throat Respiratory: Denies cough, dyspnea or wheezes Cardiovascular: Denies palpitation, chest discomfort or lower extremity swelling Gastrointestinal:  Denies nausea, heartburn or change in bowel habits Skin: Denies abnormal skin rashes Lymphatics: Denies new lymphadenopathy or easy bruising Neurological:Denies numbness, tingling or new weaknesses Behavioral/Psych: Mood is stable, no new changes  Breast: She noted some retraction of the nipple, no lumps felt. All other systems  were reviewed with the patient and are negative.  PHYSICAL EXAMINATION: ECOG PERFORMANCE STATUS: 0 - Asymptomatic  Vitals:   12/08/22 0844  BP: (!) 164/94  Pulse: 100  Resp: 16  Temp: 97.9 F (36.6 C)  SpO2: 97%   Filed Weights   12/08/22 0844  Weight: 127 lb 14.4 oz (58 kg)    GENERAL:alert, no distress and comfortable SKIN: skin color, texture, turgor are normal, no rashes or significant lesions EYES: normal, conjunctiva are pink and non-injected, sclera clear OROPHARYNX:no exudate, no erythema and lips, buccal mucosa, and tongue normal  NECK: supple, thyroid normal size, non-tender, without nodularity LYMPH:  no palpable lymphadenopathy in the cervical, axillary or inguinal LUNGS: clear to auscultation and percussion with normal breathing effort HEART: regular rate & rhythm and no murmurs and no lower extremity edema ABDOMEN:abdomen soft, non-tender and normal bowel sounds Musculoskeletal:no cyanosis of digits and no clubbing  PSYCH: alert & oriented x 3 with fluent speech NEURO: no focal motor/sensory deficits BREAST:  No palpable nodules in breast. No palpable axillary or supraclavicular lymphadenopathy Postbiopsy change  LABORATORY DATA:  I have reviewed the data as listed Lab Results  Component Value Date   WBC 5.9 12/08/2022   HGB 14.5 12/08/2022   HCT 42.2 12/08/2022   MCV 93.6 12/08/2022   PLT 153 12/08/2022   Lab Results  Component Value Date   NA 140 12/08/2022   K 3.3 (L) 12/08/2022   CL 102 12/08/2022   CO2 27 12/08/2022    RADIOGRAPHIC STUDIES: I have personally reviewed the radiological reports and agreed with the findings in the report.  ASSESSMENT AND PLAN:  Ductal carcinoma in situ (DCIS) of right breast This is a very pleasant 89 year old female patient with past medical history significant for coronary artery disease, congestive heart failure, type 2 diabetes referred to oncology for evaluation and management of DCIS.  Pathology review:  I discussed with the patient the difference between DCIS and invasive breast cancer. It is considered a precancerous lesion. DCIS is classified as a Stage 0 breast cancer. It is generally detected through  mammograms as calcifications. We discussed the significance of grades and its impact on prognosis. We also discussed the importance of ER and PR receptors and their implications to adjuvant treatment options. Prognosis of DCIS dependence on grade and degree of comedo necrosis. It is anticipated that if not treated, 20-30% of DCIS can develop into invasive breast cancer.  Recommendation: 1. Breast conserving surgery 2. Followed by adjuvant radiation therapy 3. Followed by antiestrogen therapy with tamoxifen/aromatase inhibitors based on menopausal status 5 years  Tamoxifen counseling: We discussed the risks and benefits of tamoxifen. These include but not limited to insomnia, hot flashes, mood changes, vaginal dryness, and weight gain. Although rare, serious side effects including endometrial cancer, risk of blood clots were also discussed. We strongly believe that the benefits far outweigh the risks. Patient understands these risks and consented to starting treatment. Planned treatment duration is 5 years.  Aromatase inhibitors counseling: We have discussed the mechanism of action of aromatase inhibitors today.  We have discussed adverse effects including but not limited to menopausal symptoms, increased risk of osteoporosis and fractures, cardiovascular events, arthralgias and myalgias.  We do believe that the benefits far outweigh the risks.  Plan treatment duration of 5 years.  Given weak ER staining and her advanced age, we discussed about observation after surgery since her benefit from antiestrogen therapy will likely be minimal.  She is very pleased with this plan.   All questions were answered. The patient knows to call the clinic with any problems, questions or concerns.    Benay Pike,  MD 12/08/22

## 2022-12-09 DIAGNOSIS — R2681 Unsteadiness on feet: Secondary | ICD-10-CM | POA: Diagnosis not present

## 2022-12-09 DIAGNOSIS — M6281 Muscle weakness (generalized): Secondary | ICD-10-CM | POA: Diagnosis not present

## 2022-12-13 DIAGNOSIS — M6281 Muscle weakness (generalized): Secondary | ICD-10-CM | POA: Diagnosis not present

## 2022-12-13 DIAGNOSIS — R2681 Unsteadiness on feet: Secondary | ICD-10-CM | POA: Diagnosis not present

## 2022-12-14 DIAGNOSIS — E1122 Type 2 diabetes mellitus with diabetic chronic kidney disease: Secondary | ICD-10-CM | POA: Diagnosis not present

## 2022-12-14 DIAGNOSIS — I2510 Atherosclerotic heart disease of native coronary artery without angina pectoris: Secondary | ICD-10-CM | POA: Diagnosis not present

## 2022-12-14 DIAGNOSIS — R82998 Other abnormal findings in urine: Secondary | ICD-10-CM | POA: Diagnosis not present

## 2022-12-14 DIAGNOSIS — C50011 Malignant neoplasm of nipple and areola, right female breast: Secondary | ICD-10-CM | POA: Diagnosis not present

## 2022-12-14 DIAGNOSIS — Z0000 Encounter for general adult medical examination without abnormal findings: Secondary | ICD-10-CM | POA: Diagnosis not present

## 2022-12-14 DIAGNOSIS — E1140 Type 2 diabetes mellitus with diabetic neuropathy, unspecified: Secondary | ICD-10-CM | POA: Diagnosis not present

## 2022-12-14 DIAGNOSIS — I130 Hypertensive heart and chronic kidney disease with heart failure and stage 1 through stage 4 chronic kidney disease, or unspecified chronic kidney disease: Secondary | ICD-10-CM | POA: Diagnosis not present

## 2022-12-16 ENCOUNTER — Telehealth: Payer: Self-pay | Admitting: Hematology and Oncology

## 2022-12-16 ENCOUNTER — Telehealth: Payer: Self-pay | Admitting: *Deleted

## 2022-12-16 ENCOUNTER — Encounter: Payer: Self-pay | Admitting: *Deleted

## 2022-12-16 DIAGNOSIS — M6281 Muscle weakness (generalized): Secondary | ICD-10-CM | POA: Diagnosis not present

## 2022-12-16 DIAGNOSIS — R2681 Unsteadiness on feet: Secondary | ICD-10-CM | POA: Diagnosis not present

## 2022-12-16 NOTE — Telephone Encounter (Signed)
Spoke to pt concerning Payne from 2.7.24. Denies questions or concerns regarding dx or treatment care plan. Encourage pt to call with needs. Received verbal understanding.

## 2022-12-16 NOTE — Telephone Encounter (Signed)
Per 2/15 IB reached out to patient to schedule post op, Pt aware and confirmed.

## 2022-12-20 DIAGNOSIS — M6281 Muscle weakness (generalized): Secondary | ICD-10-CM | POA: Diagnosis not present

## 2022-12-20 DIAGNOSIS — R2681 Unsteadiness on feet: Secondary | ICD-10-CM | POA: Diagnosis not present

## 2022-12-21 ENCOUNTER — Ambulatory Visit: Payer: Medicare Other

## 2022-12-21 DIAGNOSIS — I5022 Chronic systolic (congestive) heart failure: Secondary | ICD-10-CM | POA: Diagnosis not present

## 2022-12-21 DIAGNOSIS — Z95810 Presence of automatic (implantable) cardiac defibrillator: Secondary | ICD-10-CM | POA: Diagnosis not present

## 2022-12-23 DIAGNOSIS — R2681 Unsteadiness on feet: Secondary | ICD-10-CM | POA: Diagnosis not present

## 2022-12-23 DIAGNOSIS — M6281 Muscle weakness (generalized): Secondary | ICD-10-CM | POA: Diagnosis not present

## 2022-12-24 NOTE — Progress Notes (Signed)
EPIC Encounter for ICM Monitoring  Patient Name: Grace Robinson is a 89 y.o. female Date: 12/24/2022 Primary Care Physican: Shon Baton, MD Primary Cardiologist: Caryl Comes Electrophysiologist: Vergie Living Pacing:  97.8%     12/24/2022 Weight:  130 lbs   Since 30-Nov-2022 Time in AT/AF  0.0 hr/day (0.0%)           Spoke with patient and heart failure questions reviewed.  Transmission results reviewed.  Pt asymptomatic for fluid accumulation.  Reports feeling well at this time and voices no complaints.  She is having breast lumpectomy on 3/6.     Optivol Thoracic impedance suggesting intermittent days with possible fluid accumulation within the last month.      Prescribed:  Furosemide 20 mg take 1 tablet (20 mg total) by mouth daily alternating (40 mg) every other day.   Potassium 10 mEq 1 tablet twice a day.   Labs: 08/02/2022 Creatinine 0.89, BUN 15, Potassium 3.8, Sodium 144, GFR 60 A complete set of results can be found in Results Review.   Recommendations:  No changes and encouraged to call if experiencing any fluid symptoms.   Follow-up plan: ICM clinic phone appointment on 01/24/2023.   91 day device clinic remote transmission 03/02/2023.      EP/Cardiology Office Visits:  Recall 09/03/2023 with Dr Caryl Comes.     Copy of ICM check sent to Dr. Caryl Comes.    3 month ICM trend: 12/21/2022.    12-14 Month ICM trend:     [name], RN 12/24/2022 10:22 AM

## 2022-12-24 NOTE — Progress Notes (Signed)
Remote pacemaker transmission.   

## 2022-12-27 DIAGNOSIS — M6281 Muscle weakness (generalized): Secondary | ICD-10-CM | POA: Diagnosis not present

## 2022-12-27 DIAGNOSIS — R2681 Unsteadiness on feet: Secondary | ICD-10-CM | POA: Diagnosis not present

## 2022-12-30 DIAGNOSIS — M6281 Muscle weakness (generalized): Secondary | ICD-10-CM | POA: Diagnosis not present

## 2022-12-30 DIAGNOSIS — R2681 Unsteadiness on feet: Secondary | ICD-10-CM | POA: Diagnosis not present

## 2022-12-30 NOTE — Pre-Procedure Instructions (Signed)
Surgical Instructions    Your procedure is scheduled on Wednesday, March 6th.  Report to [facility] Main Entrance "A" at 06:30 A.M., then check in with the Admitting office.  Call this number if you have problems the morning of surgery:  [phone]  If you have any questions prior to your surgery date call [phone]: Open Monday-Friday 8am-4pm If you experience any cold or flu symptoms such as cough, fever, chills, shortness of breath, etc. between now and your scheduled surgery, please notify us at the above number.     Remember:  Do not eat after midnight the night before your surgery  You may drink clear liquids until 05:30 AM the morning of your surgery.   Clear liquids allowed are: Water, Non-Citrus Juices (without pulp), Carbonated Beverages, Clear Tea, Black Coffee Only (NO MILK, CREAM OR POWDERED CREAMER of any kind), and Gatorade.   Patient Instructions  The night before surgery:  No food after midnight. ONLY clear liquids after midnight   The day of surgery (if you have diabetes): Drink ONE (1) 12 oz G2 given to you in your pre admission testing appointment by 05:30 AM the morning of surgery. Drink in one sitting. Do not sip.  This drink was given to you during your hospital  pre-op appointment visit.  Nothing else to drink after completing the  12 oz bottle of G2.         If you have questions, please contact your surgeon's office.     Take these medicines the morning of surgery with A SIP OF WATER  atorvastatin (LIPITOR)  dicyclomine (BENTYL)  metoprolol succinate (TOPROL-XL)    If needed: acetaminophen (TYLENOL)    Follow your surgeon's instructions on when to stop Aspirin.  If no instructions were given by your surgeon then you will need to call the office to get those instructions.     As of today, STOP taking any Aleve, Naproxen, Ibuprofen, Motrin, Advil, Goody's, BC's, all herbal medications, fish oil, and all vitamins.   WHAT DO I DO ABOUT MY  DIABETES MEDICATION?  THE NIGHT BEFORE SURGERY, take 22 units (70%) of insulin aspart protamine - aspart (NOVOLOG 70/30 MIX) (70-30).       THE MORNING OF SURGERY, do NOT take insulin aspart protamine - aspart (NOVOLOG 70/30 MIX) (70-30).     HOW TO MANAGE YOUR DIABETES BEFORE AND AFTER SURGERY  Why is it important to control my blood sugar before and after surgery? Improving blood sugar levels before and after surgery helps healing and can limit problems. A way of improving blood sugar control is eating a healthy diet by:  Eating less sugar and carbohydrates  Increasing activity/exercise  Talking with your doctor about reaching your blood sugar goals High blood sugars (greater than 180 mg/dL) can raise your risk of infections and slow your recovery, so you will need to focus on controlling your diabetes during the weeks before surgery. Make sure that the doctor who takes care of your diabetes knows about your planned surgery including the date and location.  How do I manage my blood sugar before surgery? Check your blood sugar at least 4 times a day, starting 2 days before surgery, to make sure that the level is not too high or low.  Check your blood sugar the morning of your surgery when you wake up and every 2 hours until you get to the Short Stay unit.  If your blood sugar is less than 70 mg/dL, you will need to  treat for low blood sugar: Do not take insulin. Treat a low blood sugar (less than 70 mg/dL) with  cup of clear juice (cranberry or apple), 4 glucose tablets, OR glucose gel. Recheck blood sugar in 15 minutes after treatment (to make sure it is greater than 70 mg/dL). If your blood sugar is not greater than 70 mg/dL on recheck, call [phone] for further instructions. Report your blood sugar to the short stay nurse when you get to Short Stay.  If you are admitted to the hospital after surgery: Your blood sugar will be checked by the staff and you will probably be  given insulin after surgery (instead of oral diabetes medicines) to make sure you have good blood sugar levels. The goal for blood sugar control after surgery is 80-180 mg/dL.                     Do NOT Smoke (Tobacco/Vaping) for 24 hours prior to your procedure.  If you use a CPAP at night, you may bring your mask/headgear for your overnight stay.   Contacts, glasses, piercing's, hearing aid's, dentures or partials may not be worn into surgery, please bring cases for these belongings.    For patients admitted to the hospital, discharge time will be determined by your treatment team.   Patients discharged the day of surgery will not be allowed to drive home, and someone needs to stay with them for 24 hours.  SURGICAL WAITING ROOM VISITATION Patients having surgery or a procedure may have no more than 2 support people in the waiting area - these visitors may rotate.   Children under the age of 27 must have an adult with them who is not the patient. If the patient needs to stay at the hospital during part of their recovery, the visitor guidelines for inpatient rooms apply. Pre-op nurse will coordinate an appropriate time for 1 support person to accompany patient in pre-op.  This support person may not rotate.   Please refer to the [facility] website for the visitor guidelines for Inpatients (after your surgery is over and you are in a regular room).    Special instructions:   [place]- Preparing For Surgery  Before surgery, you can play an important role. Because skin is not sterile, your skin needs to be as free of germs as possible. You can reduce the number of germs on your skin by washing with CHG (chlorahexidine gluconate) Soap before surgery.  CHG is an antiseptic cleaner which kills germs and bonds with the skin to continue killing germs even after washing.    Oral Hygiene is also important to reduce your risk of infection.  Remember - BRUSH YOUR TEETH THE MORNING OF SURGERY WITH  YOUR REGULAR TOOTHPASTE  Please do not use if you have an allergy to CHG or antibacterial soaps. If your skin becomes reddened/irritated stop using the CHG.  Do not shave (including legs and underarms) for at least 48 hours prior to first CHG shower. It is OK to shave your face.  Please follow these instructions carefully.   Shower the NIGHT BEFORE SURGERY and the MORNING OF SURGERY  If you chose to wash your hair, wash your hair first as usual with your normal shampoo.  After you shampoo, rinse your hair and body thoroughly to remove the shampoo.  Use CHG Soap as you would any other liquid soap. You can apply CHG directly to the skin and wash gently with a scrungie or a clean washcloth.  Apply the CHG Soap to your body ONLY FROM THE NECK DOWN.  Do not use on open wounds or open sores. Avoid contact with your eyes, ears, mouth and genitals (private parts). Wash Face and genitals (private parts)  with your normal soap.   Wash thoroughly, paying special attention to the area where your surgery will be performed.  Thoroughly rinse your body with warm water from the neck down.  DO NOT shower/wash with your normal soap after using and rinsing off the CHG Soap.  Pat yourself dry with a CLEAN TOWEL.  Wear CLEAN PAJAMAS to bed the night before surgery  Place CLEAN SHEETS on your bed the night before your surgery  DO NOT SLEEP WITH PETS.   Day of Surgery: Take a shower with CHG soap. Do not wear jewelry or makeup Do not wear lotions, powders, perfumes, or deodorant. Do not shave 48 hours prior to surgery.   Do not bring valuables to the hospital. [facility] is not responsible for any belongings or valuables. Do not wear nail polish, gel polish, artificial nails, or any other type of covering on natural nails (fingers and toes) If you have artificial nails or gel coating that need to be removed by a nail salon, please have this removed prior to surgery. Artificial nails or gel coating  may interfere with anesthesia's ability to adequately monitor your vital signs. Wear Clean/Comfortable clothing the morning of surgery Remember to brush your teeth WITH YOUR REGULAR TOOTHPASTE.   Please read over the following fact sheets that you were given.    If you received a COVID test during your pre-op visit  it is requested that you wear a mask when out in public, stay away from anyone that may not be feeling well and notify your surgeon if you develop symptoms. If you have been in contact with anyone that has tested positive in the last 10 days please notify you surgeon.

## 2022-12-30 NOTE — Progress Notes (Signed)
Device orders requested f/m [facility]. Logan, [employer] rep, notified of surgery. He asked that someone call and let him know once it's confirmed whether a rep is needed or not on day of surgery

## 2022-12-31 ENCOUNTER — Encounter: Payer: Self-pay | Admitting: Internal Medicine

## 2022-12-31 ENCOUNTER — Other Ambulatory Visit: Payer: Self-pay

## 2022-12-31 ENCOUNTER — Encounter (HOSPITAL_COMMUNITY): Payer: Self-pay

## 2022-12-31 ENCOUNTER — Encounter (HOSPITAL_COMMUNITY)
Admission: RE | Admit: 2022-12-31 | Discharge: 2022-12-31 | Disposition: A | Discharge status: Home / Self Care | Payer: Medicare Other | Source: Ambulatory Visit | Attending: Surgery | Admitting: Surgery

## 2022-12-31 VITALS — BP 157/83 | HR 90 | Temp 97.7°F | Resp 16

## 2022-12-31 DIAGNOSIS — Z01818 Encounter for other preprocedural examination: Secondary | ICD-10-CM

## 2022-12-31 DIAGNOSIS — Z853 Personal history of malignant neoplasm of breast: Secondary | ICD-10-CM | POA: Diagnosis not present

## 2022-12-31 DIAGNOSIS — E785 Hyperlipidemia, unspecified: Secondary | ICD-10-CM | POA: Insufficient documentation

## 2022-12-31 DIAGNOSIS — Z794 Long term (current) use of insulin: Secondary | ICD-10-CM | POA: Diagnosis not present

## 2022-12-31 DIAGNOSIS — Z01812 Encounter for preprocedural laboratory examination: Secondary | ICD-10-CM | POA: Insufficient documentation

## 2022-12-31 DIAGNOSIS — E119 Type 2 diabetes mellitus without complications: Secondary | ICD-10-CM | POA: Insufficient documentation

## 2022-12-31 DIAGNOSIS — D0511 Intraductal carcinoma in situ of right breast: Secondary | ICD-10-CM | POA: Diagnosis not present

## 2022-12-31 DIAGNOSIS — I428 Other cardiomyopathies: Secondary | ICD-10-CM | POA: Diagnosis not present

## 2022-12-31 DIAGNOSIS — Z87891 Personal history of nicotine dependence: Secondary | ICD-10-CM | POA: Insufficient documentation

## 2022-12-31 DIAGNOSIS — I5020 Unspecified systolic (congestive) heart failure: Secondary | ICD-10-CM | POA: Insufficient documentation

## 2022-12-31 DIAGNOSIS — G250 Essential tremor: Secondary | ICD-10-CM | POA: Diagnosis not present

## 2022-12-31 DIAGNOSIS — I110 Hypertensive heart disease with heart failure: Secondary | ICD-10-CM | POA: Insufficient documentation

## 2022-12-31 HISTORY — DX: Presence of cardiac pacemaker: Z95.0

## 2022-12-31 LAB — BASIC METABOLIC PANEL
Anion gap: 13 (ref 5–15)
BUN: 13 mg/dL (ref 8–23)
CO2: 26 mmol/L (ref 22–32)
Calcium: 10 mg/dL (ref 8.9–10.3)
Chloride: 101 mmol/L (ref 98–111)
Creatinine, Ser: 0.94 mg/dL (ref 0.44–1.00)
GFR, Estimated: 56 mL/min — ABNORMAL LOW (ref >60)
Glucose, Bld: 82 mg/dL (ref 70–99)
Potassium: 3.6 mmol/L (ref 3.5–5.1)
Sodium: 140 mmol/L (ref 135–145)

## 2022-12-31 LAB — CBC
HCT: 43 % (ref 36.0–46.0)
Hemoglobin: 14.7 g/dL (ref 12.0–15.0)
MCH: 32.6 pg (ref 26.0–34.0)
MCHC: 34.2 g/dL (ref 30.0–36.0)
MCV: 95.3 fL (ref 80.0–100.0)
Platelets: 178 10*3/uL (ref 150–400)
RBC: 4.51 MIL/uL (ref 3.87–5.11)
RDW: 13.1 % (ref 11.5–15.5)
WBC: 7 10*3/uL (ref 4.0–10.5)
nRBC: 0 % (ref 0.0–0.2)

## 2022-12-31 LAB — GLUCOSE, CAPILLARY
Glucose-Capillary: 54 mg/dL — ABNORMAL LOW (ref 70–99)
Glucose-Capillary: 66 mg/dL — ABNORMAL LOW (ref 70–99)
Glucose-Capillary: 87 mg/dL (ref 70–99)

## 2022-12-31 NOTE — Progress Notes (Signed)
PCP: Dr. Virgina Jock Cardiologist: Dr. Caryl Comes  EKG: 12-07-22 CXR: n/a ECHO: 07-10-14 Stress Test: 02-18-09 Cardiac Cath: 08-2011  ASA: Pt reports she was instructed to stop x5 days  [employer] Device. Device orders received back, stating have magnet available. Contacted Myra Gianotti PA-C to confirm-- due to pacemaker on left side and proximity to surgical field on the Right side, Rep will need to reprogram device for surgery. Rep, Kandra Nicolas, contacted via secure email with confirmation of arrival prior to surgery.  Fasting Blood Sugar- Pt reports she can range between 70-200 Checks Blood Sugar once daily  Pt was running 30 mins late for appt today, reports she had trouble finding correct location. Did not eat lunch. See hypoglycemic events--juice, crackers, peanut butter provided throughout the appt. Pt's son is present during appt and states this has happened before with skipping lunch after taking AM insulin.   Hypoglycemic Event  CBG: 54  Treatment: 8 oz juice/soda  Symptoms: Hungry  Follow-up CBG: Time:1350 CBG Result:66  Possible Reasons for Event: Inadequate meal intake  Comments/MD notified: Also provided peanut butter/graham crackers. More juice provided  Hypoglycemic Event  CBG: 66  Treatment: 8 oz juice/soda  Symptoms: Hungry  Follow-up CBG: Time: 1409 CBG Result:87  Possible Reasons for Event: Inadequate meal intake  Comments/MD notified: PAT appt completed, states she is feeling better--will eat more lunch at home   Patient denies shortness of breath, fever, cough, and chest pain at PAT appointment.  Patient verbalized understanding of instructions provided today at the PAT appointment.  Patient asked to review instructions at home and day of surgery.

## 2022-12-31 NOTE — Progress Notes (Signed)
PERIOPERATIVE PRESCRIPTION FOR IMPLANTED CARDIAC DEVICE PROGRAMMING  Patient Information: Name:  Grace Robinson  DOB:  [birth year]  MRN:  [id number]  Planned Procedure:  Radioactive seed guided right breast bracketed lumpectomy  Surgeon:  Dr. Coralie Keens  Date of Procedure:  01/05/23  Cautery will be used.  Position during surgery:  supine    Device Information:  Clinic EP Physician:  Virl Axe, MD   Device Type:  Pacemaker Manufacturer and Phone #:  Medtronic: [phone] Pacemaker Dependent?:  No. Date of Last Device Check:  11/30/2022 Normal Device Function?:  No.  Electrophysiologist's Recommendations:  Have magnet available. Provide continuous ECG monitoring when magnet is used or reprogramming is to be performed.  Procedure may interfere with device function.  Magnet should be placed over device during procedure.  Per Device Clinic Standing Orders, Damian Leavell, RN  8:06 AM 12/31/2022

## 2023-01-01 LAB — HEMOGLOBIN A1C
Hgb A1c MFr Bld: 7.5 % — ABNORMAL HIGH (ref 4.8–5.6)
Mean Plasma Glucose: 169 mg/dL

## 2023-01-03 ENCOUNTER — Encounter (HOSPITAL_COMMUNITY): Payer: Self-pay

## 2023-01-03 DIAGNOSIS — C50411 Malignant neoplasm of upper-outer quadrant of right female breast: Secondary | ICD-10-CM | POA: Diagnosis not present

## 2023-01-03 NOTE — Progress Notes (Signed)
Anesthesia Chart Review:  Case: [id number] Date/Time: 01/05/23 0815   Procedure: RIGHT BREAST BRACKETED LUMPECTOMY WITH RADIOACTIVE SEED LOCALIZATION (Right: Breast)   Anesthesia type: General   Pre-op diagnosis: RIGHT BREAST DCIS   Location: [facility] OR ROOM 01 / [facility]   Surgeons: Coralie Keens, MD       DISCUSSION: Patient is a 89 year old female scheduled for the above procedure.   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 Possible prior TIA (~ 2021).   Last EP visit with Dr. Caryl Comes was on 12/07/22. As above, her ICD was replaced with Medtronic CRT-P on 08/30/22 since she had normalization of LF function in 2014 and new normal in 2015, and due to her age and peace with dying, she opted to downgrade from CRT-D and CRT-P.Marland Kitchen She denied chest pain and SOB at that visit, stable functional status, no intercurrrent ventricular rhythms. He noted recent diagnosis of breast cancer and was awaiting interdisciplinary team meeting. 9 month cardiology follow-up planned. Dr. Trevor Mace office did reach out to cardiology regarding perioperative ASA instructions and was referred back to PCP or neurology.  Per EP Cardiac Device Perioperative Rx Standing Orders form: Device Information: Clinic EP Physician:  Virl Axe, MD  Device Type:  Pacemaker Manufacturer and Phone #:  Medtronic: [id number] Pacemaker Dependent?:  No. Date of Last  Device Check:  11/30/2022       Normal Device Function?:  No.   Electrophysiologist's Recommendations: Have magnet available. Provide continuous ECG monitoring when magnet is used or reprogramming is to be performed.  Procedure may interfere with device function.  Magnet should be placed over device during procedure. - HOWEVER, after discussion with anesthesiologist, given left chest PPM is close proximity to operative site, recommended [place] to reprogramming before and after procedure. PAT RN contacted [place] with [employer].   RSL scheduled for 01/02/22 at 2:30 PM. Anesthesia team to evaluate on the day of surgery.   VS: BP (!) 157/83   Pulse 90   Temp 36.5 C   Resp 16   SpO2 97%   PROVIDERS: Shon Baton, MD is PCP  Virl Axe, MD is EP cardiologist Benay Pike, MD is Baker Pierini, MD is RAD-ON[facility] Tat, Wells Guiles, DO is neurologist Deitra Mayo, MD is vascular surgeon   LABS: Labs reviewed: Acceptable for surgery. (all labs ordered are listed, but only abnormal results are displayed)  Labs Reviewed  GLUCOSE, CAPILLARY - Abnormal; Notable for the following components:      Result Value   Glucose-Capillary 54 (*)    All other components within normal limits  GLUCOSE, CAPILLARY - Abnormal; Notable for the following components:   Glucose-Capillary 66 (*)    All other components within normal limits  HEMOGLOBIN A1C - Abnormal; Notable for the following components:   Hgb A1c MFr Bld 7.5 (*)    All other components within normal limits  BASIC METABOLIC PANEL - Abnormal; Notable for the following components:   GFR, Estimated 56 (*)    All other components within normal  limits  CBC  GLUCOSE, CAPILLARY     IMAGES: CT Head 09/29/22: IMPRESSION: Atrophy and chronic small vessel ischemic changes. No acute intracranial process identified.  CT C-spine 09/29/22: IMPRESSION: 1. No acute cervical spine findings. 2. Cervical spondylosis and degenerative disc  disease causing bilateral foraminal impingement at C4-5, C5-6, and C6-7. 3. Mild bilateral common carotid atherosclerotic calcification. 4. Biapical pleuroparenchymal scarring, in continuity with the previously identified ground-glass density nodule at the right apex shown back on 08/27/2016, which is likely a postinflammatory residuum.   EKG: 12/07/22:  AV pacing at 77 negative QRS lead I and lead V1    CV: US Carotid 03/30/22: IMPRESSION: 1. Right carotid artery system: Less than 50% stenosis secondary to moderate multifocal atherosclerotic plaque formation. 2. Left carotid artery system: 50-69% stenosis secondary to focal atherosclerotic plaque formation in the proximal to mid internal carotid artery. 3.  Vertebral artery system: Patent with antegrade flow bilaterally.    Echo 07/10/14: Study Conclusions  - Left ventricle: The cavity size was normal. Systolic function was    normal. The estimated ejection fraction was in the range of 50%    to 55%. There was an increased relative contribution of atrial    contraction to ventricular filling. Doppler parameters are    consistent with abnormal left ventricular relaxation (grade 1    diastolic dysfunction).  - Ventricular septum: Septal motion showed paradox. These changes    are consistent with right ventricular pacing.  - Aortic valve: There was mild regurgitation.  - Mitral valve: There is a calcified mobile density in the LV    cavity most consistent with calcified redundant chordae tendinae.    This was present on echo in 2014. There was mild regurgitation.  - Pulmonic valve: There was trivial regurgitation.  - Comparison: LVEF 60% 12/20/12; 25-35% 12/10/02, 30-40% 12/06/02   Normal nuclear stress study 02/18/09.   Cardiac cath 11/11/04: OVERALL IMPRESSION:  1.  Severe left ventricular dysfunction with global hypokinesia with grossly-      appearing satisfactory resynchronization.  2.  Minimal coronary atherosclerosis with  distal vessel disease in a small      branch of the distal right coronary artery, 20-30% narrowing in a high      obtuse marginal, and 20-30% narrowing in a second diagonal vessel.  DISCUSSION:  These findings are all consistent with a nonischemic cardiomyopathy.  We will proceed on with ICD implantation.   Past Medical History:  Diagnosis Date   Biventricular ICD (implantable cardiac defibrillator) in place    downgraded from CRT-D to CRT-P 08/30/22   Breast cancer ([place])    Cellulitis    Change in bowel habits    CHF (congestive heart failure) ([place])    Colon polyps    Diabetes mellitus    Type II   Diarrhea    Dyslipidemia    HOH (hard of hearing)    left   HTN (hypertension)    LBBB (left bundle branch block)    chronic   Meniere's disease    Nonischemic cardiomyopathy ([place]) 2006   Presence of permanent cardiac pacemaker    T wave oversensing 01/17/2014   Ulcerative colitis    Varicose veins    4 lazer  treatment each leg    Past Surgical History:  Procedure Laterality Date   ABDOMINAL HYSTERECTOMY     BIV PACEMAKER GENERATOR CHANGEOUT N/A 08/30/2022   Procedure: BIV PACEMAKER GENERATOR CHANGEOUT;  Surgeon: Deboraha Sprang, MD;  Location: [facility];  Service: Cardiovascular;  Laterality: N/A;   CARDIAC CATHETERIZATION  08/2011   CHOLECYSTECTOMY     COLONOSCOPY     EP Study  08/22/08   IMPLANTABLE CARDIOVERTER DEFIBRILLATOR GENERATOR CHANGE N/A 08/22/2013   Procedure: IMPLANTABLE CARDIOVERTER DEFIBRILLATOR GENERATOR CHANGE;  Surgeon: Deboraha Sprang, MD;  Location: [facility] CATH LAB;  Service: Cardiovascular;  Laterality: N/A;   KNEE ARTHROSCOPY Left 04/14/2016   Procedure: LEFT KNEE ARTHROSCOPY, PARTIAL MEDIAL AND PARTIAL LATERAL MENISCECTOMY, MEDIAL PLICA;  Surgeon: Dorna Leitz, MD;  Location: [facility];  Service: Orthopedics;  Laterality: Left;   mastoid sugery     in the setting of Meniere's disease   TONSILLECTOMY AND ADENOIDECTOMY      MEDICATIONS:   acetaminophen (TYLENOL) 500 MG tablet   aspirin EC 81 MG tablet   atorvastatin (LIPITOR) 40 MG tablet   Cholecalciferol (VITAMIN D-3) 25 MCG (1000 UT) CAPS   cholestyramine (QUESTRAN) 4 g packet   CRANBERRY CONCENTRATE PO   dicyclomine (BENTYL) 10 MG capsule   furosemide (LASIX) 20 MG tablet   insulin aspart protamine - aspart (NOVOLOG 70/30 MIX) (70-30) 100 UNIT/ML FlexPen   Lancets (ONETOUCH DELICA PLUS Q000111Q) MISC   loperamide (IMODIUM A-D) 2 MG tablet   metoprolol succinate (TOPROL-XL) 100 MG 24 hr tablet   olmesartan (BENICAR) 40 MG tablet   ONETOUCH VERIO test strip   potassium chloride (K-DUR) 10 MEQ tablet   vitamin B-12 (CYANOCOBALAMIN) 1000 MCG tablet   vitamin E 400 UNIT capsule   No current facility-administered medications for this encounter.    Myra Gianotti, PA-C Surgical Short Stay/Anesthesiology [facility] Phone [phone] [facility] Phone [phone] 01/03/2023 2:41 PM

## 2023-01-03 NOTE — Anesthesia Preprocedure Evaluation (Signed)
Anesthesia Evaluation  Patient identified by MRN, date of birth, ID band Patient awake    Reviewed: Allergy & Precautions, NPO status , Patient's Chart, lab work & pertinent test results, reviewed documented beta blocker date and time   Airway Mallampati: III  TM Distance: <3 FB Neck ROM: Full    Dental  (+) Teeth Intact, Dental Advisory Given   Pulmonary former smoker   Pulmonary exam normal breath sounds clear to auscultation       Cardiovascular hypertension, Pt. on home beta blockers and Pt. on medications (-) angina +CHF  (-) Past MI Normal cardiovascular exam+ dysrhythmias (LBBB) + pacemaker  Rhythm:Regular Rate:Normal     Neuro/Psych negative neurological ROS  negative psych ROS   GI/Hepatic Neg liver ROS, PUD,,,  Endo/Other  diabetes, Type 2, Insulin Dependent    Renal/GU negative Renal ROS     Musculoskeletal negative musculoskeletal ROS (+)    Abdominal   Peds  Hematology negative hematology ROS (+)   Anesthesia Other Findings RIGHT BREAST DCIS  Reproductive/Obstetrics                              Anesthesia Physical Anesthesia Plan  ASA: 4  Anesthesia Plan: General   Post-op Pain Management: Tylenol PO (pre-op)*   Induction: Intravenous  PONV Risk Score and Plan: 3 and Dexamethasone, Ondansetron and Treatment may vary due to age or medical condition  Airway Management Planned: LMA  Additional Equipment:   Intra-op Plan:   Post-operative Plan: Extubation in OR  Informed Consent: I have reviewed the patients History and Physical, chart, labs and discussed the procedure including the risks, benefits and alternatives for the proposed anesthesia with the patient or authorized representative who has indicated his/her understanding and acceptance.     Dental advisory given  Plan Discussed with: CRNA  Anesthesia Plan Comments: (PAT note written 01/03/2023 by Myra Gianotti, PA-C. She has a Medtronic CRT-D now.  )        Anesthesia Quick Evaluation

## 2023-01-04 NOTE — H&P (Signed)
REFERRING PHYSICIAN: Mel Almond* PROVIDER: Beverlee Nims, MD MRN: [id number] DOB: February 26, 1926  Subjective  Chief Complaint: Breast Cancer  History of Present Illness: Grace Robinson is a 89 y.o. female who is seen as an office consultation for evaluation of Breast Cancer  This is a 89 year old female was found on recent screening mammography to have normal calcifications in the right breast. This was deep in the breast. She did have a mildly retracted nipple. She is uncertain of how long the nipple has been retracted but has no discharge. The calcifications measured 2.7 cm x 4 cm x 2 cm. There is also a mass in the midst of this measuring 6 mm. It is at the 9:30 position. Ultrasound the axilla was unremarkable. She underwent a biopsy showing high-grade ductal carcinoma in situ with necrosis. It was 10% ER positive, PR negative.  she does have a significant cardiac history and just saw her cardiologist yesterday. Currently denies chest pain and her defibrillator and pacemaker I believe are no longer functioning. Her last ejection fraction was about 50%.  Review of Systems: A complete review of systems was obtained from the patient. I have reviewed this information and discussed as appropriate with the patient. See HPI as well for other ROS.  ROS  Medical History: Past Medical History: Diagnosis Date Biventricular ICD (implantable cardioverter-defibrillator) in place CHF (congestive heart failure) (CMS-HCC) Diabetes mellitus without complication (CMS-HCC) History of cancer Hypertension  Patient Active Problem List Diagnosis Biventricular implantable cardioverter-defibrillator in situ Ductal carcinoma in situ (DCIS) of right breast Essential (primary) hypertension Systolic heart failure, chronic (CMS-HCC)  Past Surgical History: Procedure Laterality Date Biventricular Pacemaker Generator Changeout N/A  08/30/2022 CHOLECYSTECTOMY HYSTERECTOMY   Allergies Allergen Reactions Carvedilol Other (See Comments) Pt does not remember reaction Digoxin Other (See Comments) Dig toxicity Vancomycin Other (See Comments) Unknown  ("Red Man Syndrome") Pt states she turned red and was itching Pseudoephedrine Other (See Comments) Makes skin feel like its crawling Unknown Makes skin feel like its crawling  Makes skin feel like its crawling Unknown  Makes skin feel like its crawling  Current Outpatient Medications on File Prior to Visit Medication Sig Dispense Refill dicyclomine (BENTYL) 10 mg capsule Take 10 mg by mouth 3 (three) times daily 30 minutes Before Meals insulin ASPART PROTAMINE-ASPART (NOVOLOG MIX FLEXPEN 70/30) 100 unit/mL (70-30) pen injector Inject 32 Units subcutaneously 2 (two) times daily May Inject 15 units if blood sugar is greater than 200 aspirin 81 MG EC tablet Take 81 mg by mouth once daily atorvastatin (LIPITOR) 40 MG tablet Take 40 mg by mouth once daily cholecalciferol (VITAMIN D3) 1000 unit capsule Take 1,000 Units by mouth once daily 2,000 units cyanocobalamin (VITAMIN B12) 1000 MCG tablet Take 1,000 mcg by mouth once daily FUROsemide (LASIX) 20 MG tablet Take 20 mg by mouth once daily loperamide (IMODIUM A-D) 2 mg tablet Take 2 mg by mouth as needed for Diarrhea metoprolol succinate 100 mg CSpX Take 1 capsule by mouth once daily olmesartan (BENICAR) 40 MG tablet Take 40 mg by mouth once daily primidone (MYSOLINE) 250 MG tablet Take 250 mg by mouth 3 (three) times daily vitamin E 400 UNIT capsule Take 400 Units by mouth once daily  No current facility-administered medications on file prior to visit.  Family History Problem Relation Age of Onset Breast cancer Mother Lung cancer Mother Heart disease Father Emphysema Father   Social History  Tobacco Use Smoking Status Former Types: Cigarettes Quit date: 1970 Years since quitting: 54.1 Smokeless Tobacco  Never   Social History  Socioeconomic History Marital status: Single Tobacco Use Smoking status: Former Types: Cigarettes Quit date: 1970 Years since quitting: 54.1 Smokeless tobacco: Never  Objective: There were no vitals filed for this visit. There is no height or weight on file to calculate BMI.  Physical Exam  She is well and spry on exam  Minimal bruising in the right breast from the biopsy. The nipple is slightly retracted but there are no palpable masses. No axillary adenopathy.  Labs, Imaging and Diagnostic Testing: I have reviewed her mammograms, ultrasound, pathology results as well as her last visit from cardiology yesterday  Assessment and Plan:  Diagnoses and all orders for this visit:  Neoplasm of right breast, primary tumor staging category Tis: ductal carcinoma in situ (DCIS)   We have discussed her in our multidisciplinary breast cancer conference. From a surgical standpoint I discussed options with the patient and her son. We discussed proceeding with a radioactive seed guided bracketed right breast lumpectomy versus mastectomy. She is interested in a lumpectomy. Again, I believe I would need at least 2 seeds to bracket the area. I explained the surgical procedure in detail. We discussed the risks which includes but is not limited to bleeding, infection, the need for further surgery if margins are positive, cardiopulmonary issues given her age, etc. She would like to do this as an outpatient and go home the same day but we will have a bed available in case she needs to stay overnight given her age and general anesthesia.  She and her son understand and agree with the current plans. Surgery will be scheduled.

## 2023-01-05 ENCOUNTER — Other Ambulatory Visit: Payer: Self-pay

## 2023-01-05 ENCOUNTER — Ambulatory Visit (HOSPITAL_COMMUNITY)
Admission: RE | Admit: 2023-01-05 | Discharge: 2023-01-06 | Disposition: A | Discharge status: Home / Self Care | Payer: Medicare Other | Attending: Surgery | Admitting: Surgery

## 2023-01-05 ENCOUNTER — Ambulatory Visit (HOSPITAL_COMMUNITY): Payer: Medicare Other | Admitting: Vascular Surgery

## 2023-01-05 ENCOUNTER — Encounter (HOSPITAL_COMMUNITY)
Admission: RE | Disposition: A | Discharge status: Home / Self Care | Payer: Self-pay | Source: Home / Self Care | Attending: Surgery

## 2023-01-05 ENCOUNTER — Encounter (HOSPITAL_COMMUNITY): Payer: Self-pay | Admitting: Surgery

## 2023-01-05 ENCOUNTER — Ambulatory Visit (HOSPITAL_BASED_OUTPATIENT_CLINIC_OR_DEPARTMENT_OTHER): Payer: Medicare Other | Admitting: Certified Registered"

## 2023-01-05 DIAGNOSIS — Z87891 Personal history of nicotine dependence: Secondary | ICD-10-CM | POA: Insufficient documentation

## 2023-01-05 DIAGNOSIS — I509 Heart failure, unspecified: Secondary | ICD-10-CM | POA: Diagnosis not present

## 2023-01-05 DIAGNOSIS — D0511 Intraductal carcinoma in situ of right breast: Secondary | ICD-10-CM | POA: Insufficient documentation

## 2023-01-05 DIAGNOSIS — Z950 Presence of cardiac pacemaker: Secondary | ICD-10-CM | POA: Diagnosis not present

## 2023-01-05 DIAGNOSIS — Z9889 Other specified postprocedural states: Secondary | ICD-10-CM

## 2023-01-05 DIAGNOSIS — Z794 Long term (current) use of insulin: Secondary | ICD-10-CM | POA: Insufficient documentation

## 2023-01-05 DIAGNOSIS — I110 Hypertensive heart disease with heart failure: Secondary | ICD-10-CM | POA: Diagnosis not present

## 2023-01-05 DIAGNOSIS — Z8711 Personal history of peptic ulcer disease: Secondary | ICD-10-CM | POA: Insufficient documentation

## 2023-01-05 DIAGNOSIS — E119 Type 2 diabetes mellitus without complications: Secondary | ICD-10-CM | POA: Insufficient documentation

## 2023-01-05 DIAGNOSIS — Z170 Estrogen receptor positive status [ER+]: Secondary | ICD-10-CM | POA: Insufficient documentation

## 2023-01-05 DIAGNOSIS — Z8249 Family history of ischemic heart disease and other diseases of the circulatory system: Secondary | ICD-10-CM | POA: Insufficient documentation

## 2023-01-05 DIAGNOSIS — Z79899 Other long term (current) drug therapy: Secondary | ICD-10-CM | POA: Insufficient documentation

## 2023-01-05 DIAGNOSIS — C50911 Malignant neoplasm of unspecified site of right female breast: Secondary | ICD-10-CM | POA: Diagnosis not present

## 2023-01-05 DIAGNOSIS — I5022 Chronic systolic (congestive) heart failure: Secondary | ICD-10-CM | POA: Diagnosis not present

## 2023-01-05 HISTORY — PX: BREAST LUMPECTOMY WITH RADIOACTIVE SEED LOCALIZATION: SHX6424

## 2023-01-05 LAB — GLUCOSE, CAPILLARY
Glucose-Capillary: 107 mg/dL — ABNORMAL HIGH (ref 70–99)
Glucose-Capillary: 156 mg/dL — ABNORMAL HIGH (ref 70–99)
Glucose-Capillary: 299 mg/dL — ABNORMAL HIGH (ref 70–99)
Glucose-Capillary: 314 mg/dL — ABNORMAL HIGH (ref 70–99)

## 2023-01-05 SURGERY — BREAST LUMPECTOMY WITH RADIOACTIVE SEED LOCALIZATION
Anesthesia: General | Site: Breast | Laterality: Right

## 2023-01-05 MED ORDER — CHLORHEXIDINE GLUCONATE CLOTH 2 % EX PADS: 6.0000 | MEDICATED_PAD | Freq: Once | CUTANEOUS | Status: DC

## 2023-01-05 MED ORDER — IRBESARTAN 75 MG PO TABS
37.5000 mg | ORAL_TABLET | Freq: Every day | ORAL | Status: DC
  Filled 2023-01-05: qty 1

## 2023-01-05 MED ORDER — PROPOFOL 500 MG/50ML IV EMUL
INTRAVENOUS | Status: DC | PRN
  Administered 2023-01-05: 125 ug/kg/min via INTRAVENOUS

## 2023-01-05 MED ORDER — LIDOCAINE 2% (20 MG/ML) 5 ML SYRINGE
INTRAMUSCULAR | Status: DC | PRN
  Administered 2023-01-05: 60 mg via INTRAVENOUS

## 2023-01-05 MED ORDER — LACTATED RINGERS IV SOLN: INTRAVENOUS | Status: DC

## 2023-01-05 MED ORDER — ONDANSETRON HCL 4 MG/2ML IJ SOLN
INTRAMUSCULAR | Status: DC | PRN
  Administered 2023-01-05: 4 mg via INTRAVENOUS

## 2023-01-05 MED ORDER — ONDANSETRON HCL 4 MG/2ML IJ SOLN: 4.0000 mg | Freq: Once | INTRAMUSCULAR | Status: DC | PRN

## 2023-01-05 MED ORDER — INSULIN ASPART 100 UNIT/ML IJ SOLN
0.0000 [IU] | Freq: Every day | INTRAMUSCULAR | Status: DC
  Administered 2023-01-05: 4 [IU] via SUBCUTANEOUS

## 2023-01-05 MED ORDER — SODIUM CHLORIDE 0.9 % IV SOLN: INTRAVENOUS | Status: DC

## 2023-01-05 MED ORDER — BUPIVACAINE-EPINEPHRINE 0.25% -1:200000 IJ SOLN
INTRAMUSCULAR | Status: DC | PRN
  Administered 2023-01-05: 20 mL

## 2023-01-05 MED ORDER — PROPOFOL 10 MG/ML IV BOLUS
INTRAVENOUS | Status: DC | PRN
  Administered 2023-01-05: 30 mg via INTRAVENOUS
  Administered 2023-01-05: 70 mg via INTRAVENOUS
  Administered 2023-01-05: 50 mg via INTRAVENOUS

## 2023-01-05 MED ORDER — INSULIN ASPART 100 UNIT/ML IJ SOLN: 0.0000 [IU] | INTRAMUSCULAR | Status: DC | PRN

## 2023-01-05 MED ORDER — DICYCLOMINE HCL 10 MG PO CAPS
10.0000 mg | ORAL_CAPSULE | Freq: Three times a day (TID) | ORAL | Status: DC
  Administered 2023-01-05 – 2023-01-06 (×2): 10 mg via ORAL
  Filled 2023-01-05 (×3): qty 1

## 2023-01-05 MED ORDER — ACETAMINOPHEN 500 MG PO TABS
1000.0000 mg | ORAL_TABLET | ORAL | Status: AC
  Administered 2023-01-05: 1000 mg via ORAL
  Filled 2023-01-05: qty 2

## 2023-01-05 MED ORDER — CHLORHEXIDINE GLUCONATE 0.12 % MT SOLN
15.0000 mL | Freq: Once | OROMUCOSAL | Status: AC
  Administered 2023-01-05: 15 mL via OROMUCOSAL
  Filled 2023-01-05: qty 15

## 2023-01-05 MED ORDER — FENTANYL CITRATE (PF) 250 MCG/5ML IJ SOLN
INTRAMUSCULAR | Status: AC
  Filled 2023-01-05: qty 5

## 2023-01-05 MED ORDER — PROPOFOL 10 MG/ML IV BOLUS
INTRAVENOUS | Status: AC
  Filled 2023-01-05: qty 20

## 2023-01-05 MED ORDER — ENSURE PRE-SURGERY PO LIQD: 296.0000 mL | Freq: Once | ORAL | Status: DC

## 2023-01-05 MED ORDER — INSULIN ASPART 100 UNIT/ML IJ SOLN
0.0000 [IU] | Freq: Three times a day (TID) | INTRAMUSCULAR | Status: DC
  Administered 2023-01-05: 5 [IU] via SUBCUTANEOUS
  Administered 2023-01-06: 3 [IU] via SUBCUTANEOUS

## 2023-01-05 MED ORDER — ORAL CARE MOUTH RINSE: 15.0000 mL | Freq: Once | OROMUCOSAL | Status: AC

## 2023-01-05 MED ORDER — PHENYLEPHRINE HCL-NACL 20-0.9 MG/250ML-% IV SOLN
INTRAVENOUS | Status: DC | PRN
  Administered 2023-01-05: 30 ug/min via INTRAVENOUS

## 2023-01-05 MED ORDER — FENTANYL CITRATE (PF) 250 MCG/5ML IJ SOLN
INTRAMUSCULAR | Status: DC | PRN
  Administered 2023-01-05: 50 ug via INTRAVENOUS
  Administered 2023-01-05: 25 ug via INTRAVENOUS

## 2023-01-05 MED ORDER — 0.9 % SODIUM CHLORIDE (POUR BTL) OPTIME
TOPICAL | Status: DC | PRN
  Administered 2023-01-05: 1000 mL

## 2023-01-05 MED ORDER — CEFAZOLIN SODIUM-DEXTROSE 2-4 GM/100ML-% IV SOLN
2.0000 g | INTRAVENOUS | Status: AC
  Administered 2023-01-05: 2 g via INTRAVENOUS
  Filled 2023-01-05: qty 100

## 2023-01-05 MED ORDER — PHENYLEPHRINE 80 MCG/ML (10ML) SYRINGE FOR IV PUSH (FOR BLOOD PRESSURE SUPPORT)
PREFILLED_SYRINGE | INTRAVENOUS | Status: DC | PRN
  Administered 2023-01-05: 120 ug via INTRAVENOUS

## 2023-01-05 MED ORDER — FENTANYL CITRATE (PF) 100 MCG/2ML IJ SOLN: 25.0000 ug | INTRAMUSCULAR | Status: DC | PRN

## 2023-01-05 MED ORDER — METOPROLOL SUCCINATE ER 100 MG PO TB24
100.0000 mg | ORAL_TABLET | Freq: Two times a day (BID) | ORAL | Status: DC
  Administered 2023-01-05: 100 mg via ORAL
  Filled 2023-01-05: qty 1

## 2023-01-05 MED ORDER — FUROSEMIDE 20 MG PO TABS
20.0000 mg | ORAL_TABLET | Freq: Every day | ORAL | Status: DC
  Administered 2023-01-05: 20 mg via ORAL
  Filled 2023-01-05: qty 1

## 2023-01-05 MED ORDER — ATORVASTATIN CALCIUM 40 MG PO TABS: 40.0000 mg | ORAL_TABLET | Freq: Every day | ORAL | Status: DC

## 2023-01-05 MED ORDER — ONDANSETRON HCL 4 MG/2ML IJ SOLN: 4.0000 mg | Freq: Four times a day (QID) | INTRAMUSCULAR | Status: DC | PRN

## 2023-01-05 MED ORDER — ONDANSETRON 4 MG PO TBDP: 4.0000 mg | ORAL_TABLET | Freq: Four times a day (QID) | ORAL | Status: DC | PRN

## 2023-01-05 MED ORDER — TRAMADOL HCL 50 MG PO TABS: 50.0000 mg | ORAL_TABLET | Freq: Four times a day (QID) | ORAL | Status: DC | PRN

## 2023-01-05 MED ORDER — ACETAMINOPHEN 500 MG PO TABS: 500.0000 mg | ORAL_TABLET | Freq: Three times a day (TID) | ORAL | Status: DC | PRN

## 2023-01-05 MED ORDER — DEXAMETHASONE SODIUM PHOSPHATE 10 MG/ML IJ SOLN
INTRAMUSCULAR | Status: DC | PRN
  Administered 2023-01-05: 10 mg via INTRAVENOUS

## 2023-01-05 MED ORDER — BUPIVACAINE-EPINEPHRINE (PF) 0.25% -1:200000 IJ SOLN
INTRAMUSCULAR | Status: AC
  Filled 2023-01-05: qty 30

## 2023-01-05 SURGICAL SUPPLY — 35 items
ADH SKN CLS APL DERMABOND .7 (GAUZE/BANDAGES/DRESSINGS) ×1
APL PRP STRL LF DISP 70% ISPRP (MISCELLANEOUS) ×1
APPLIER CLIP 9.375 MED OPEN (MISCELLANEOUS) ×1
APR CLP MED 9.3 20 MLT OPN (MISCELLANEOUS) ×1
BAG COUNTER SPONGE SURGICOUNT (BAG) ×1 IMPLANT
BAG SPNG CNTER NS LX DISP (BAG) ×1
BINDER BREAST LRG (GAUZE/BANDAGES/DRESSINGS) IMPLANT
BINDER BREAST XLRG (GAUZE/BANDAGES/DRESSINGS) IMPLANT
CANISTER SUCT 3000ML PPV (MISCELLANEOUS) ×1 IMPLANT
CHLORAPREP W/TINT 26 (MISCELLANEOUS) ×1 IMPLANT
CLIP APPLIE 9.375 MED OPEN (MISCELLANEOUS) ×1 IMPLANT
COVER PROBE W GEL 5X96 (DRAPES) ×1 IMPLANT
COVER SURGICAL LIGHT HANDLE (MISCELLANEOUS) ×1 IMPLANT
DERMABOND ADVANCED .7 DNX12 (GAUZE/BANDAGES/DRESSINGS) ×1 IMPLANT
DEVICE DUBIN SPECIMEN MAMMOGRA (MISCELLANEOUS) ×1 IMPLANT
DRAPE CHEST BREAST 15X10 FENES (DRAPES) ×1 IMPLANT
ELECT CAUTERY BLADE 6.4 (BLADE) ×1 IMPLANT
ELECT REM PT RETURN 9FT ADLT (ELECTROSURGICAL) ×1
ELECTRODE REM PT RTRN 9FT ADLT (ELECTROSURGICAL) ×1 IMPLANT
GLOVE SURG SIGNA 7.5 PF LTX (GLOVE) ×1 IMPLANT
GOWN STRL REUS W/ TWL LRG LVL3 (GOWN DISPOSABLE) ×1 IMPLANT
GOWN STRL REUS W/ TWL XL LVL3 (GOWN DISPOSABLE) ×1 IMPLANT
GOWN STRL REUS W/TWL LRG LVL3 (GOWN DISPOSABLE) ×1
GOWN STRL REUS W/TWL XL LVL3 (GOWN DISPOSABLE) ×1
KIT BASIN OR (CUSTOM PROCEDURE TRAY) ×1 IMPLANT
KIT MARKER MARGIN INK (KITS) ×1 IMPLANT
NDL HYPO 25GX1X1/2 BEV (NEEDLE) ×1 IMPLANT
NEEDLE HYPO 25GX1X1/2 BEV (NEEDLE) ×1 IMPLANT
NS IRRIG 1000ML POUR BTL (IV SOLUTION) IMPLANT
PACK GENERAL/GYN (CUSTOM PROCEDURE TRAY) ×1 IMPLANT
SUT MNCRL AB 4-0 PS2 18 (SUTURE) ×1 IMPLANT
SUT VIC AB 3-0 SH 18 (SUTURE) ×1 IMPLANT
SYR CONTROL 10ML LL (SYRINGE) ×1 IMPLANT
TOWEL GREEN STERILE (TOWEL DISPOSABLE) ×1 IMPLANT
TOWEL GREEN STERILE FF (TOWEL DISPOSABLE) ×1 IMPLANT

## 2023-01-05 NOTE — Anesthesia Postprocedure Evaluation (Signed)
Anesthesia Post Note  Patient: Grace Robinson  Procedure(s) Performed: RIGHT BREAST BRACKETED LUMPECTOMY WITH RADIOACTIVE SEED LOCALIZATION (Right: Breast)     Patient location during evaluation: PACU Anesthesia Type: General Level of consciousness: awake and alert Pain management: pain level controlled Vital Signs Assessment: post-procedure vital signs reviewed and stable Respiratory status: spontaneous breathing, nonlabored ventilation, respiratory function stable and patient connected to nasal cannula oxygen Cardiovascular status: blood pressure returned to baseline and stable Postop Assessment: no apparent nausea or vomiting Anesthetic complications: no   No notable events documented.  Last Vitals:  Vitals:   01/05/23 1200 01/05/23 1230  BP: (!) 170/59 (!) 189/89  Pulse:  86  Resp:  18  Temp:  36.4 C  SpO2: 99% 100%    Last Pain:  Vitals:   01/05/23 1230  TempSrc: Oral  PainSc:                  Santa Lighter

## 2023-01-05 NOTE — Op Note (Signed)
RIGHT BREAST BRACKETED LUMPECTOMY WITH RADIOACTIVE SEED LOCALIZATION  Procedure Note  Grace Robinson 01/05/2023   Pre-op Diagnosis: RIGHT BREAST DUCTAL CARCINOMA IN SITU     Post-op Diagnosis: same  Procedure(s): RIGHT BREAST BRACKETED LUMPECTOMY WITH RADIOACTIVE SEED LOCALIZATION  Surgeon(s): Coralie Keens, MD  Anesthesia: General  Staff:  Circulator: Lindwood Coke, RN; Hal Morales, RN Scrub Person: Thora Lance Circulator Assistant: Margy Clarks, RN  Estimated Blood Loss: Minimal               Specimens: sent to path  Indications: This is a 89 year old female who was found on recent screening mammography have calcifications and a small mass in the right breast.  The area of calcifications in the mass with biopsy showing ductal carcinoma in situ which was only 10% ER positive and PR negative.  The area measured 3 cm in multiple directions.  She wishes to proceed with a bracketed right breast lumpectomy with radioactive seed guidance  Procedure: The patient was brought to the operating identified as the correct patient.  She was placed upon the operating table and general anesthesia was induced.  Her right breast was prepped and draped in usual sterile fashion.  I located the 2 radioactive seeds in the lateral breast with the neoprobe.  I made an incision in the upper outer quadrant just above the nipple areolar complex with a scalpel after anesthetized skin with Marcaine.  I then dissected down to the breast tissue.  With the aid of the neoprobe I then dissected widely in all directions first medial and then lateral and anterior and inferior taking the dissection all the way down to the chest wall.  I then slowly took the lumpectomy specimen off the chest wall dissecting medial to lateral with the cautery.  The large lumpectomy specimen was then completely removed.  I marked all margins with paint.  An x-ray was performed confirming that the 2 radioactive seeds, the  calcifications, and the original biopsy clip were all in the specimen.  The specimen was then sent to pathology for evaluation.  Achieved hemostasis with cautery.  I anesthetized the incision further with Marcaine.  I then closed the subcutaneous tissue with interrupted 3-0 Vicryl sutures and closed the skin with running 4-0 Monocryl.  Dermabond was then applied.  The patient tolerated the procedure well.  All the counts were correct at the end of the procedure.  The patient was then extubated in the operating room and taken in a stable condition to the recovery room.          Coralie Keens   Date: 01/05/2023  Time: 9:14 AM

## 2023-01-05 NOTE — Interval H&P Note (Signed)
History and Physical Interval Note:no change in H and P   01/05/2023 6:53 AM  Grace Robinson  has presented today for surgery, with the diagnosis of RIGHT BREAST DCIS.  The various methods of treatment have been discussed with the patient and family. After consideration of risks, benefits and other options for treatment, the patient has consented to  Procedure(s): RIGHT BREAST BRACKETED LUMPECTOMY WITH RADIOACTIVE SEED LOCALIZATION (Right) as a surgical intervention.  The patient's history has been reviewed, patient examined, no change in status, stable for surgery.  I have reviewed the patient's chart and labs.  Questions were answered to the patient's satisfaction.     Coralie Keens

## 2023-01-05 NOTE — Transfer of Care (Signed)
Immediate Anesthesia Transfer of Care Note  Patient: Grace Robinson  Procedure(s) Performed: RIGHT BREAST BRACKETED LUMPECTOMY WITH RADIOACTIVE SEED LOCALIZATION (Right: Breast)  Patient Location: PACU  Anesthesia Type:General  Level of Consciousness: drowsy  Airway & Oxygen Therapy: Patient Spontanous Breathing and Patient connected to face mask oxygen  Post-op Assessment: Report given to RN and Post -op Vital signs reviewed and stable  Post vital signs: Reviewed and stable  Last Vitals:  Vitals Value Taken Time  BP 102/43 01/05/23 0919  Temp    Pulse 60 01/05/23 0921  Resp 11 01/05/23 0921  SpO2 100 % 01/05/23 0921  Vitals shown include unvalidated device data.  Last Pain:  Vitals:   01/05/23 0710  TempSrc:   PainSc: 0-No pain         Complications: No notable events documented.

## 2023-01-05 NOTE — Plan of Care (Signed)
  Problem: Skin Integrity: Goal: Risk for impaired skin integrity will decrease Outcome: Progressing   Problem: Clinical Measurements: Goal: Ability to maintain clinical measurements within normal limits will improve Outcome: Progressing Goal: Will remain free from infection Outcome: Progressing

## 2023-01-05 NOTE — Anesthesia Procedure Notes (Signed)
Procedure Name: LMA Insertion Date/Time: 01/05/2023 8:38 AM  Performed by: Griffin Dakin, CRNAPre-anesthesia Checklist: Patient identified, Emergency Drugs available, Suction available, Patient being monitored and Timeout performed Patient Re-evaluated:Patient Re-evaluated prior to induction Oxygen Delivery Method: Circle system utilized Preoxygenation: Pre-oxygenation with 100% oxygen Induction Type: IV induction Ventilation: Mask ventilation without difficulty LMA: LMA inserted LMA Size: 4.0 Number of attempts: 1 Placement Confirmation: breath sounds checked- equal and bilateral and positive ETCO2 Tube secured with: Tape Dental Injury: Teeth and Oropharynx as per pre-operative assessment

## 2023-01-06 ENCOUNTER — Encounter (HOSPITAL_COMMUNITY): Payer: Self-pay | Admitting: Surgery

## 2023-01-06 DIAGNOSIS — Z87891 Personal history of nicotine dependence: Secondary | ICD-10-CM | POA: Diagnosis not present

## 2023-01-06 DIAGNOSIS — Z79899 Other long term (current) drug therapy: Secondary | ICD-10-CM | POA: Diagnosis not present

## 2023-01-06 DIAGNOSIS — I110 Hypertensive heart disease with heart failure: Secondary | ICD-10-CM | POA: Diagnosis not present

## 2023-01-06 DIAGNOSIS — Z950 Presence of cardiac pacemaker: Secondary | ICD-10-CM | POA: Diagnosis not present

## 2023-01-06 DIAGNOSIS — Z8249 Family history of ischemic heart disease and other diseases of the circulatory system: Secondary | ICD-10-CM | POA: Diagnosis not present

## 2023-01-06 DIAGNOSIS — I5022 Chronic systolic (congestive) heart failure: Secondary | ICD-10-CM | POA: Diagnosis not present

## 2023-01-06 DIAGNOSIS — D0511 Intraductal carcinoma in situ of right breast: Secondary | ICD-10-CM | POA: Diagnosis not present

## 2023-01-06 DIAGNOSIS — Z170 Estrogen receptor positive status [ER+]: Secondary | ICD-10-CM | POA: Diagnosis not present

## 2023-01-06 DIAGNOSIS — Z794 Long term (current) use of insulin: Secondary | ICD-10-CM | POA: Diagnosis not present

## 2023-01-06 DIAGNOSIS — Z8711 Personal history of peptic ulcer disease: Secondary | ICD-10-CM | POA: Diagnosis not present

## 2023-01-06 DIAGNOSIS — E119 Type 2 diabetes mellitus without complications: Secondary | ICD-10-CM | POA: Diagnosis not present

## 2023-01-06 LAB — GLUCOSE, CAPILLARY: Glucose-Capillary: 231 mg/dL — ABNORMAL HIGH (ref 70–99)

## 2023-01-06 MED ORDER — TRAMADOL HCL 50 MG PO TABS: 50.0000 mg | ORAL_TABLET | Freq: Four times a day (QID) | ORAL | 0 refills | Status: DC | PRN

## 2023-01-06 NOTE — Discharge Instructions (Signed)
[place] Office Phone Number [phone]  BREAST BIOPSY/ PARTIAL MASTECTOMY: POST OP INSTRUCTIONS  Always review your discharge instruction sheet given to you by the facility where your surgery was performed.  IF YOU HAVE DISABILITY OR FAMILY LEAVE FORMS, YOU MUST BRING THEM TO THE OFFICE FOR PROCESSING.  DO NOT GIVE THEM TO YOUR DOCTOR.  A prescription for pain medication may be given to you upon discharge.  Take your pain medication as prescribed, if needed.  If narcotic pain medicine is not needed, then you may take acetaminophen (Tylenol) or ibuprofen (Advil) as needed. Take your usually prescribed medications unless otherwise directed If you need a refill on your pain medication, please contact your pharmacy.  They will contact our office to request authorization.  Prescriptions will not be filled after 5pm or on week-ends. You should eat very light the first 24 hours after surgery, such as soup, crackers, pudding, etc.  Resume your normal diet the day after surgery. Most patients will experience some swelling and bruising in the breast.  Ice packs and a good support bra will help.  Swelling and bruising can take several days to resolve.  It is common to experience some constipation if taking pain medication after surgery.  Increasing fluid intake and taking a stool softener will usually help or prevent this problem from occurring.  A mild laxative (Milk of Magnesia or Miralax) should be taken according to package directions if there are no bowel movements after 48 hours. Unless discharge instructions indicate otherwise, you may remove your bandages 24-48 hours after surgery, and you may shower at that time.  You may have steri-strips (small skin tapes) in place directly over the incision.  These strips should be left on the skin for 7-10 days.  If your surgeon used skin glue on the incision, you may shower in 24 hours.  The glue will flake off over the next 2-3 weeks.  Any  sutures or staples will be removed at the office during your follow-up visit. ACTIVITIES:  You may resume regular daily activities (gradually increasing) beginning the next day.  Wearing a good support bra or sports bra minimizes pain and swelling.  You may have sexual intercourse when it is comfortable. You may drive when you no longer are taking prescription pain medication, you can comfortably wear a seatbelt, and you can safely maneuver your car and apply brakes. RETURN TO WORK:  ______________________________________________________________________________________ Dennis Bast should see your doctor in the office for a follow-up appointment approximately two weeks after your surgery.  Your doctor's nurse will typically make your follow-up appointment when she calls you with your pathology report.  Expect your pathology report 2-3 business days after your surgery.  You may call to check if you do not hear from [place] after three days. OTHER INSTRUCTIONS: you may remove the binder and shower starting today and then put the binder back on Ice pack, tylenol, and ibuprofen also for pain No vigorous activity for one week _______________________________________________________________________________________________ _____________________________________________________________________________________________________________________________________ _____________________________________________________________________________________________________________________________________ _____________________________________________________________________________________________________________________________________  WHEN TO CALL YOUR DOCTOR: Fever over 101.0 Nausea and/or vomiting. Extreme swelling or bruising. Continued bleeding from incision. Increased pain, redness, or drainage from the incision.  The clinic staff is available to answer your questions during regular business hours.  Please don't hesitate to call  and ask to speak to one of the nurses for clinical concerns.  If you have a medical emergency, go to the nearest emergency room or call 911.  A surgeon from [facility] is always  on call at the hospital.  For further questions, please visit [email or web address]

## 2023-01-06 NOTE — Discharge Summary (Signed)
Physician Discharge Summary  Patient ID: Grace Robinson MRN: [id number] DOB/AGE: [birth year] 89 y.o.  Admit date: 01/05/2023 Discharge date: 01/06/2023  Admission Diagnoses:  Discharge Diagnoses:  Principal Problem:   S/P lumpectomy, right breast Right breast cancer  Discharged Condition: good  Hospital Course: uneventful post op recovery. Discharged home POD#1  Consults: None  Significant Diagnostic Studies:   Treatments: surgery: radioactive seed bracketed right breat lumpectomy  Discharge Exam: Blood pressure (!) 143/55, pulse 66, temperature 97.6 F (36.4 C), temperature source Oral, resp. rate 16, height '5\' 5"'$  (1.651 m), weight 58.5 kg, SpO2 100 %. General appearance: alert, cooperative, and no distress Resp: clear to auscultation bilaterally Cardio: regular rate and rhythm, S1, S2 normal, no murmur, click, rub or gallop Incision/Wound: breast incision clean  Disposition: Discharge disposition: 01-Home or Self Care        Allergies as of 01/06/2023       Reactions   Carvedilol Other (See Comments)   Pt does not remember reaction    Digoxin    Dig toxicity   Vancomycin Other (See Comments)   ("Red Man Syndrome") Pt states she turned red and was itching    Sudafed [pseudoephedrine] Other (See Comments)   Makes skin feel like its crawling        Medication List     TAKE these medications    acetaminophen 500 MG tablet Commonly known as: TYLENOL Take 500 mg by mouth every 8 (eight) hours as needed for headache.   aspirin EC 81 MG tablet Take 81 mg by mouth daily. Swallow whole.   atorvastatin 40 MG tablet Commonly known as: LIPITOR Take 40 mg by mouth daily.   cholestyramine 4 g packet Commonly known as: QUESTRAN Take 1 packet (4 g total) by mouth daily. What changed:  when to take this reasons to take this   CRANBERRY CONCENTRATE PO Take 2 tablets by mouth daily.   cyanocobalamin 1000 MCG tablet Commonly known as: VITAMIN B12 Take 1,000 mcg  by mouth once a week.   dicyclomine 10 MG capsule Commonly known as: BENTYL TAKE 1 CAPSULE BY MOUTH THREE TIMES DAILY 30 MINUTES BEFORE MEALS   furosemide 20 MG tablet Commonly known as: LASIX Take 1 tablet (20 mg total) by mouth daily. alternating (40 mg) every other day.   insulin aspart protamine - aspart (70-30) 100 UNIT/ML FlexPen Commonly known as: NOVOLOG 70/30 MIX Inject 32 Units into the skin 2 (two) times daily with a meal. May Inject 15 units if blood sugar is greater than 200   loperamide 2 MG tablet Commonly known as: IMODIUM A-D Take 2 mg by mouth as needed for diarrhea or loose stools.   metoprolol succinate 100 MG 24 hr tablet Commonly known as: TOPROL-XL Take 100 mg by mouth 2 (two) times daily.   olmesartan 40 MG tablet Commonly known as: BENICAR Take 40 mg by mouth daily.   OneTouch Delica Plus 123456 Misc CHECK BLOOD SUGAR TWICE DAILY   OneTouch Verio test strip Generic drug: glucose blood 2 (two) times daily.   potassium chloride 10 MEQ tablet Commonly known as: KLOR-CON M Take 10 mEq by mouth 2 (two) times daily.   traMADol 50 MG tablet Commonly known as: ULTRAM Take 1 tablet (50 mg total) by mouth every 6 (six) hours as needed.   Vitamin D-3 25 MCG (1000 UT) Caps Take 2,000 Units by mouth daily.   vitamin E 180 MG (400 UNITS) capsule Take 400 Units by mouth daily.  Follow-up Information     Coralie Keens, MD. Schedule an appointment as soon as possible for a visit in 3 week(s).   Specialty: General Surgery Contact information: [address] [place] [address] [phone]                 Signed: Coralie Keens 01/06/2023, 6:36 AM

## 2023-01-06 NOTE — Progress Notes (Signed)
Janeth Rase Allshouse to be D/C'd  per MD order.  Discussed with the patient and all questions fully answered.  VSS, Skin clean, dry and intact without evidence of skin break down, no evidence of skin tears noted.  IV catheter discontinued intact. Site without signs and symptoms of complications. Dressing and pressure applied.  An After Visit Summary was printed and given to the patient.   D/c education completed with patient/family including follow up instructions, medication list, d/c activities limitations if indicated, with other d/c instructions as indicated by MD - patient able to verbalize understanding, all questions fully answered.   Patient instructed to return to ED, call 911, or call MD for any changes in condition.   Patient to be escorted via [place], and D/C home via private auto.

## 2023-01-07 DIAGNOSIS — C50911 Malignant neoplasm of unspecified site of right female breast: Secondary | ICD-10-CM | POA: Diagnosis not present

## 2023-01-07 LAB — SURGICAL PATHOLOGY

## 2023-01-13 ENCOUNTER — Encounter: Payer: Self-pay | Admitting: *Deleted

## 2023-01-13 DIAGNOSIS — D0511 Intraductal carcinoma in situ of right breast: Secondary | ICD-10-CM

## 2023-01-15 ENCOUNTER — Telehealth: Payer: Self-pay | Admitting: Hematology and Oncology

## 2023-01-15 NOTE — Telephone Encounter (Signed)
Scheduled appointment per 3/15 scheduling message. Patient is aware of the made appointment.

## 2023-01-24 ENCOUNTER — Ambulatory Visit: Payer: Medicare Other | Attending: Internal Medicine

## 2023-01-24 ENCOUNTER — Telehealth: Payer: Self-pay

## 2023-01-24 DIAGNOSIS — I5022 Chronic systolic (congestive) heart failure: Secondary | ICD-10-CM | POA: Diagnosis not present

## 2023-01-24 DIAGNOSIS — Z95810 Presence of automatic (implantable) cardiac defibrillator: Secondary | ICD-10-CM

## 2023-01-24 NOTE — Telephone Encounter (Signed)
Remote ICM transmission received.  Attempted call to patient regarding ICM remote transmission and no answer.  

## 2023-01-24 NOTE — Progress Notes (Unsigned)
EPIC Encounter for ICM Monitoring  Patient Name: Grace Robinson is a 89 y.o. female Date: 01/24/2023 Primary Care Physican: Shon Baton, MD Primary Cardiologist: Caryl Comes Electrophysiologist: Vergie Living Pacing:  98.1%     12/24/2022 Weight:  130 lbs   Since 05-Jan-2023 Time in AT/AF  <0.1 hr/day (<0.1%)           Spoke with patient and heart failure questions reviewed.  Transmission results reviewed.  Pt asymptomatic for fluid accumulation.  Reports feeling well at this time and voices no complaints.     Optivol Thoracic impedance suggesting possible fluid accumulation starting 3/4.      Prescribed:  Furosemide 20 mg take 1 tablet (20 mg total) by mouth daily alternating (40 mg) every other day.   Potassium 10 mEq 1 tablet twice a day.   Labs: 12/31/2022 Creatinine 0.94, BUN 13, Potassium 3.6, Sodium 140, GFR 56 12/08/2022 Creatinine 1.02, BUN 15, Potassium 3.3, Sodium 140 08/02/2022 Creatinine 0.89, BUN 15, Potassium 3.8, Sodium 144, GFR 60 A complete set of results can be found in Results Review.   Recommendations:  Advised to take Lasix 40 mg for 2 consecutive days and then return to alternating dosages of 20 mg every other day   Follow-up plan: ICM clinic phone appointment on 01/31/2023 to recheck fluids.   91 day device clinic remote transmission 03/02/2023.      EP/Cardiology Office Visits:  Recall 09/03/2023 with Dr Caryl Comes.     Copy of ICM check sent to Dr. Caryl Comes.    3 month ICM trend: 01/17/2023.    12-14 Month ICM trend:     [name], RN 01/24/2023 10:31 AM

## 2023-01-26 ENCOUNTER — Telehealth: Payer: Self-pay | Admitting: Hematology and Oncology

## 2023-01-26 ENCOUNTER — Inpatient Hospital Stay: Payer: Medicare Other | Admitting: Hematology and Oncology

## 2023-01-26 NOTE — Telephone Encounter (Signed)
Left patient a vm regarding upcoming appointment rescheduled

## 2023-01-31 ENCOUNTER — Ambulatory Visit: Payer: Medicare Other | Attending: Internal Medicine

## 2023-01-31 DIAGNOSIS — Z95810 Presence of automatic (implantable) cardiac defibrillator: Secondary | ICD-10-CM

## 2023-01-31 DIAGNOSIS — I5022 Chronic systolic (congestive) heart failure: Secondary | ICD-10-CM

## 2023-02-01 NOTE — Progress Notes (Signed)
EPIC Encounter for ICM Monitoring  Patient Name: Grace Robinson is a 89 y.o. female Date: 02/01/2023 Primary Care Physican: Shon Baton, MD Primary Cardiologist: Caryl Comes Electrophysiologist: Vergie Living Pacing:  98.2%     12/24/2022 Weight:  130 lbs   Since 24-Jan-2023 Time in AT/AF  0.0 hr/day (0.0%)           Spoke with patient and heart failure questions reviewed.  Transmission results reviewed.  Pt asymptomatic for fluid accumulation.  Reports feeling well at this time and voices no complaints.     Optivol Thoracic impedance suggesting possible fluid accumulation starting 3/4.      Prescribed:  Furosemide 20 mg take 1 tablet (20 mg total) by mouth daily alternating (40 mg) every other day.   Potassium 10 mEq 1 tablet twice a day.   Labs: 12/31/2022 Creatinine 0.94, BUN 13, Potassium 3.6, Sodium 140, GFR 56 12/08/2022 Creatinine 1.02, BUN 15, Potassium 3.3, Sodium 140 08/02/2022 Creatinine 0.89, BUN 15, Potassium 3.8, Sodium 144, GFR 60 A complete set of results can be found in Results Review.   Recommendations:  Advised to take Lasix 40 mg for 3 consecutive days and then return to alternating dosages of 20 mg every other day   Follow-up plan: ICM clinic phone appointment on 02/04/2023 to recheck fluids.   91 day device clinic remote transmission 03/02/2023.      EP/Cardiology Office Visits:  Recall 09/03/2023 with Dr Caryl Comes.     Copy of ICM check sent to Dr. Caryl Comes.    3 month ICM trend: 01/31/2023.    12-14 Month ICM trend:     [name], RN 02/01/2023 9:48 AM

## 2023-02-03 ENCOUNTER — Encounter (HOSPITAL_COMMUNITY): Payer: Self-pay

## 2023-02-04 ENCOUNTER — Ambulatory Visit: Payer: Medicare Other | Attending: Internal Medicine

## 2023-02-04 DIAGNOSIS — Z95810 Presence of automatic (implantable) cardiac defibrillator: Secondary | ICD-10-CM

## 2023-02-04 DIAGNOSIS — I5022 Chronic systolic (congestive) heart failure: Secondary | ICD-10-CM

## 2023-02-04 NOTE — Progress Notes (Signed)
EPIC Encounter for ICM Monitoring  Patient Name: Grace Robinson is a 89 y.o. female Date: 02/04/2023 Primary Care Physican: Creola Corn, MD Primary Cardiologist: Graciela Husbands Electrophysiologist: Joycelyn Schmid Pacing:  98.2%     12/24/2022 Weight:  130 lbs   Clinical Status (31-Jan-2023 to 04-Feb-2023) Time in AT/AF  0.0 hr/day (0.0%)           Spoke with patient and heart failure questions reviewed.  Transmission results reviewed.  Pt asymptomatic for fluid accumulation.  She has a hematoma in the breast that had the lumpectomy and will go back for follow up next week.     Optivol Thoracic impedance suggesting fluid levels returned to normal after taking extra lasix.       Prescribed:  Furosemide 20 mg take 1 tablet (20 mg total) by mouth daily alternating (40 mg) every other day.   Potassium 10 mEq 1 tablet twice a day.   Labs: 12/31/2022 Creatinine 0.94, BUN 13, Potassium 3.6, Sodium 140, GFR 56 12/08/2022 Creatinine 1.02, BUN 15, Potassium 3.3, Sodium 140 08/02/2022 Creatinine 0.89, BUN 15, Potassium 3.8, Sodium 144, GFR 60 A complete set of results can be found in Results Review.   Recommendations:  She has resumed the alternating prescribed Lasix dosage.  No changes and encouraged to call if experiencing any fluid symptoms.   Follow-up plan: ICM clinic phone appointment on 02/28/2023.   91 day device clinic remote transmission 03/02/2023.      EP/Cardiology Office Visits:  Recall 09/03/2023 with Dr Graciela Husbands.     Copy of ICM check sent to Dr. Graciela Husbands.    3 month ICM trend: 02/04/2023.    12-14 Month ICM trend:     Karie Soda, RN 02/04/2023 10:27 AM

## 2023-02-07 DIAGNOSIS — R2681 Unsteadiness on feet: Secondary | ICD-10-CM | POA: Diagnosis not present

## 2023-02-07 DIAGNOSIS — M6281 Muscle weakness (generalized): Secondary | ICD-10-CM | POA: Diagnosis not present

## 2023-02-10 DIAGNOSIS — R2681 Unsteadiness on feet: Secondary | ICD-10-CM | POA: Diagnosis not present

## 2023-02-10 DIAGNOSIS — M6281 Muscle weakness (generalized): Secondary | ICD-10-CM | POA: Diagnosis not present

## 2023-02-15 ENCOUNTER — Other Ambulatory Visit: Payer: Self-pay

## 2023-02-15 ENCOUNTER — Inpatient Hospital Stay: Payer: Medicare Other | Attending: Hematology and Oncology | Admitting: Hematology and Oncology

## 2023-02-15 VITALS — BP 167/98 | HR 91 | Temp 98.1°F | Resp 16 | Ht 65.0 in | Wt 130.2 lb

## 2023-02-15 DIAGNOSIS — Z7982 Long term (current) use of aspirin: Secondary | ICD-10-CM | POA: Insufficient documentation

## 2023-02-15 DIAGNOSIS — Z87891 Personal history of nicotine dependence: Secondary | ICD-10-CM | POA: Insufficient documentation

## 2023-02-15 DIAGNOSIS — Z794 Long term (current) use of insulin: Secondary | ICD-10-CM | POA: Insufficient documentation

## 2023-02-15 DIAGNOSIS — I509 Heart failure, unspecified: Secondary | ICD-10-CM | POA: Insufficient documentation

## 2023-02-15 DIAGNOSIS — D0511 Intraductal carcinoma in situ of right breast: Secondary | ICD-10-CM | POA: Diagnosis not present

## 2023-02-15 DIAGNOSIS — E119 Type 2 diabetes mellitus without complications: Secondary | ICD-10-CM | POA: Diagnosis not present

## 2023-02-15 DIAGNOSIS — Z79899 Other long term (current) drug therapy: Secondary | ICD-10-CM | POA: Insufficient documentation

## 2023-02-15 DIAGNOSIS — Z803 Family history of malignant neoplasm of breast: Secondary | ICD-10-CM | POA: Insufficient documentation

## 2023-02-15 DIAGNOSIS — I110 Hypertensive heart disease with heart failure: Secondary | ICD-10-CM | POA: Insufficient documentation

## 2023-02-15 DIAGNOSIS — Z801 Family history of malignant neoplasm of trachea, bronchus and lung: Secondary | ICD-10-CM | POA: Insufficient documentation

## 2023-02-15 NOTE — Progress Notes (Signed)
[facility] CONSULT NOTE  Patient Care Team: Creola Corn, MD as PCP - General (Internal Medicine) Abigail Miyamoto, MD as Consulting Physician (General Surgery) Rachel Moulds, MD as Consulting Physician (Hematology and Oncology) Dorothy Puffer, MD as Consulting Physician (Radiation Oncology) Pershing Proud, RN as Oncology Nurse Navigator Donnelly Angelica, RN as Oncology Nurse Navigator  CHIEF COMPLAINTS/PURPOSE OF CONSULTATION:  Breast cancer follow-up  HISTORY OF PRESENTING ILLNESS:  Grace Robinson 89 y.o. female is here because of recent diagnosis of right breast DCIS  I reviewed her records extensively and collaborated the history with the patient.  SUMMARY OF ONCOLOGIC HISTORY: Oncology History  Ductal carcinoma in situ (DCIS) of right breast  11/22/2022 Mammogram   1.7 * 5.8 cm* 3.4 segmental to borderline pleomorphic calcs in the right breast highly suggestive of malignancy. Stereotactic biopsy recommended.   12/01/2022 Pathology Results   DCIS, high grade,  ER 10% pos weak staining, PR neg   12/06/2022 Initial Diagnosis   Ductal carcinoma in situ (DCIS) of right breast   12/08/2022 Cancer Staging   Staging form: Breast, AJCC 8th Edition - Clinical: Stage 0 (cTis (DCIS), cN0, cM0, G2, ER+, PR-, HER2-) - Signed by Rachel Moulds, MD on 12/08/2022 Stage prefix: Initial diagnosis Histologic grading system: 3 grade system    Interval History  Patient arrived to the appointment today with her son.  She is healthy at baseline except for history of congestive heart failure although she says that her cardiologist is quite pleased with the most recent workup.  She also has type 2 diabetes mellitus controlled on insulin, last hemoglobin A1c 7%. Since her last visit here she had right breast lumpectomy which showed DCIS, solid type, nuclear grade 3 with necrosis and calcifications, DCIS greatest dimension is 5 cm, ER 10% low positive, weak staining intensity, PR 0% She is  here with her son today. She is healing well and as expected she says. Rest of the pertinent 10 point ROS reviewed and neg.  MEDICAL HISTORY:  Past Medical History:  Diagnosis Date   Biventricular ICD (implantable cardiac defibrillator) in place    downgraded from CRT-D to CRT-P 08/30/22   Breast cancer (HCC)    Cellulitis    Change in bowel habits    CHF (congestive heart failure) (HCC)    Colon polyps    Diabetes mellitus    Type II   Diarrhea    Dyslipidemia    HOH (hard of hearing)    left   HTN (hypertension)    LBBB (left bundle branch block)    chronic   Meniere's disease    Nonischemic cardiomyopathy (HCC) 2006   Presence of permanent cardiac pacemaker    T wave oversensing 01/17/2014   Ulcerative colitis    Varicose veins    4 lazer  treatment each leg    SURGICAL HISTORY: Past Surgical History:  Procedure Laterality Date   ABDOMINAL HYSTERECTOMY     BIV PACEMAKER GENERATOR CHANGEOUT N/A 08/30/2022   Procedure: BIV PACEMAKER GENERATOR CHANGEOUT;  Surgeon: Duke Salvia, MD;  Location: [facility] INVASIVE CV LAB;  Service: Cardiovascular;  Laterality: N/A;   BREAST LUMPECTOMY WITH RADIOACTIVE SEED LOCALIZATION Right 01/05/2023   Procedure: RIGHT BREAST BRACKETED LUMPECTOMY WITH RADIOACTIVE SEED LOCALIZATION;  Surgeon: Abigail Miyamoto, MD;  Location: [facility] OR;  Service: General;  Laterality: Right;   CARDIAC CATHETERIZATION  08/2011   CHOLECYSTECTOMY     COLONOSCOPY     EP Study  08/22/08   IMPLANTABLE CARDIOVERTER  DEFIBRILLATOR GENERATOR CHANGE N/A 08/22/2013   Procedure: IMPLANTABLE CARDIOVERTER DEFIBRILLATOR GENERATOR CHANGE;  Surgeon: Duke Salvia, MD;  Location: [facility] CATH LAB;  Service: Cardiovascular;  Laterality: N/A;   KNEE ARTHROSCOPY Left 04/14/2016   Procedure: LEFT KNEE ARTHROSCOPY, PARTIAL MEDIAL AND PARTIAL LATERAL MENISCECTOMY, MEDIAL PLICA;  Surgeon: Jodi Geralds, MD;  Location: [facility] OR;  Service: Orthopedics;  Laterality: Left;   mastoid sugery     in the  setting of Meniere's disease   TONSILLECTOMY AND ADENOIDECTOMY      SOCIAL HISTORY: Social History   Socioeconomic History   Marital status: Single    Spouse name: Not on file   Number of children: 2   Years of education: Not on file   Highest education level: Bachelor's degree (e.g., BA, AB, BS)  Occupational History   Occupation: retired    Comment: Runner, 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    Social Determinants of Health   Financial Resource Strain: Low Risk  (12/08/2022)   Overall Financial Resource Strain (CARDIA)    Difficulty of Paying Living Expenses: Not hard at all  Food Insecurity: No Food Insecurity (12/08/2022)   Hunger Vital Sign    Worried About Running Out of Food in the Last Year: Never true    Ran Out of Food in the Last Year: Never true  Transportation Needs: No Transportation Needs (12/08/2022)   PRAPARE - Administrator, Civil Service (Medical): No    Lack of Transportation (Non-Medical): No  Physical Activity: Not on file  Stress: Not on file  Social Connections: Not on file  Intimate Partner Violence: Not on file    FAMILY HISTORY: Family History  Problem Relation Age of Onset   Breast cancer Mother 77       met to lungs   Emphysema Father 69   Heart disease Father    Healthy Child    Lung cancer Cousin        maternal first cousin, she smoked   Colon cancer Neg Hx    Rectal cancer Neg Hx    Stomach cancer Neg Hx    Liver cancer Neg Hx    Esophageal cancer Neg Hx     ALLERGIES:  is allergic to carvedilol, digoxin,  vancomycin, and sudafed [pseudoephedrine].  MEDICATIONS:  Current Outpatient Medications  Medication Sig Dispense Refill   acetaminophen (TYLENOL) 500 MG tablet Take 500 mg by mouth every 8 (eight) hours as needed for headache.     aspirin EC 81 MG tablet Take 81 mg by mouth daily. Swallow whole.     atorvastatin (LIPITOR) 40 MG tablet Take 40 mg by mouth daily.  3   Cholecalciferol (VITAMIN D-3) 25 MCG (1000 UT) CAPS Take 2,000 Units by mouth daily.     cholestyramine (QUESTRAN) 4 g packet Take 1 packet (4 g total) by mouth daily. (Patient taking differently: Take 4 g by mouth daily as needed (diarrhea).) 90 each 2   CRANBERRY  CONCENTRATE PO Take 2 tablets by mouth daily.     dicyclomine (BENTYL) 10 MG capsule TAKE 1 CAPSULE BY MOUTH THREE TIMES DAILY 30 MINUTES BEFORE MEALS 90 capsule 2   furosemide (LASIX) 20 MG tablet Take 1 tablet (20 mg total) by mouth daily. alternating (40 mg) every other day. 60 tablet 11   insulin aspart protamine - aspart (NOVOLOG 70/30 MIX) (70-30) 100 UNIT/ML FlexPen Inject 32 Units into the skin 2 (two) times daily with a meal. May Inject 15 units if blood sugar is greater than 200     Lancets (ONETOUCH DELICA PLUS LANCET30G) MISC CHECK BLOOD SUGAR TWICE DAILY     loperamide (IMODIUM A-D) 2 MG tablet Take 2 mg by mouth as needed for diarrhea or loose stools.     metoprolol succinate (TOPROL-XL) 100 MG 24 hr tablet Take 100 mg by mouth 2 (two) times daily.     olmesartan (BENICAR) 40 MG tablet Take 40 mg by mouth daily.  3   ONETOUCH VERIO test strip 2 (two) times daily.     potassium chloride (K-DUR) 10 MEQ tablet Take 10 mEq by mouth 2 (two) times daily.     traMADol (ULTRAM) 50 MG tablet Take 1 tablet (50 mg total) by mouth every 6 (six) hours as needed. 15 tablet 0   vitamin B-12 (CYANOCOBALAMIN) 1000 MCG tablet Take 1,000 mcg by mouth once a week.     vitamin E 400 UNIT capsule Take 400 Units by mouth daily.     No current facility-administered medications  for this visit.    REVIEW OF SYSTEMS:   Constitutional: Denies fevers, chills or abnormal night sweats Eyes: Denies blurriness of vision, double vision or watery eyes Ears, nose, mouth, throat, and face: Denies mucositis or sore throat Respiratory: Denies cough, dyspnea or wheezes Cardiovascular: Denies palpitation, chest discomfort or lower extremity swelling Gastrointestinal:  Denies nausea, heartburn or change in bowel habits Skin: Denies abnormal skin rashes Lymphatics: Denies new lymphadenopathy or easy bruising Neurological:Denies numbness, tingling or new weaknesses Behavioral/Psych: Mood is stable, no new changes  Breast: She noted some retraction of the nipple, no lumps felt. All other systems were reviewed with the patient and are negative.  PHYSICAL EXAMINATION: ECOG PERFORMANCE STATUS: 0 - Asymptomatic  Vitals:   02/15/23 1245  BP: (!) 167/98  Pulse: 91  Resp: 16  Temp: 98.1 F (36.7 C)  SpO2: 95%   Filed Weights   02/15/23 1245  Weight: 130 lb 3.2 oz (59.1 kg)    GENERAL:alert, no distress and comfortable   LABORATORY DATA:  I have reviewed the data as listed Lab Results  Component Value Date   WBC 7.0 12/31/2022   HGB 14.7 12/31/2022   HCT 43.0 12/31/2022   MCV 95.3 12/31/2022   PLT 178 12/31/2022   Lab Results  Component Value Date   NA 140 12/31/2022   K 3.6 12/31/2022   CL 101 12/31/2022   CO2 26 12/31/2022    RADIOGRAPHIC STUDIES: I have personally reviewed the radiological reports and agreed with the findings in the report.  ASSESSMENT AND PLAN:  Ductal carcinoma in situ (DCIS) of right breast This is a very pleasant 89 year old female patient with past medical history significant for coronary artery disease, congestive heart failure, type 2 diabetes referred to oncology for evaluation and management of DCIS. She is now status post lumpectomy, final pathology with DCIS.  She is not quite sure if she will need adjuvant radiation.  She is  otherwise healing  well.  No concerns.  I have sent an in basket message to Dr. Tildon Husky and his team to see if she would benefit from adjuvant radiation. Given weak ER staining and her advanced age, we discussed about observation after surgery since her benefit from antiestrogen therapy will likely be minimal.  We once again discussed about role of antiestrogen therapy and management of breast cancer.  She is very pleased with this plan overall. She will return to clinic in 6 months or sooner as needed.  Thank you for consulting [place] in the care of this patient.  Please do not hesitate to contact us with any additional questions or concerns.    All questions were answered. The patient knows to call the clinic with any problems, questions or concerns.    Rachel Moulds, MD 02/15/23

## 2023-02-15 NOTE — Assessment & Plan Note (Signed)
This is a very pleasant 89 year old female patient with past medical history significant for coronary artery disease, congestive heart failure, type 2 diabetes referred to oncology for evaluation and management of DCIS. She is now status post lumpectomy, final pathology with DCIS.  She is not quite sure if she will need adjuvant radiation.  She is otherwise healing well.  No concerns.  I have sent an in basket message to Dr. Tildon Husky and his team to see if she would benefit from adjuvant radiation. Given weak ER staining and her advanced age, we discussed about observation after surgery since her benefit from antiestrogen therapy will likely be minimal.  We once again discussed about role of antiestrogen therapy and management of breast cancer.  She is very pleased with this plan overall. She will return to clinic in 6 months or sooner as needed.  Thank you for consulting [place] in the care of this patient.  Please do not hesitate to contact us with any additional questions or concerns.

## 2023-02-17 DIAGNOSIS — R2681 Unsteadiness on feet: Secondary | ICD-10-CM | POA: Diagnosis not present

## 2023-02-17 DIAGNOSIS — M6281 Muscle weakness (generalized): Secondary | ICD-10-CM | POA: Diagnosis not present

## 2023-02-18 ENCOUNTER — Telehealth: Payer: Self-pay | Admitting: Radiation Oncology

## 2023-02-18 ENCOUNTER — Other Ambulatory Visit: Payer: Self-pay

## 2023-02-18 ENCOUNTER — Emergency Department (HOSPITAL_BASED_OUTPATIENT_CLINIC_OR_DEPARTMENT_OTHER): Payer: Medicare Other

## 2023-02-18 ENCOUNTER — Emergency Department (HOSPITAL_BASED_OUTPATIENT_CLINIC_OR_DEPARTMENT_OTHER)
Admission: EM | Admit: 2023-02-18 | Discharge: 2023-02-18 | Disposition: A | Discharge status: Home / Self Care | Payer: Medicare Other | Attending: Emergency Medicine | Admitting: Emergency Medicine

## 2023-02-18 ENCOUNTER — Encounter (HOSPITAL_BASED_OUTPATIENT_CLINIC_OR_DEPARTMENT_OTHER): Payer: Self-pay

## 2023-02-18 DIAGNOSIS — S3210XA Unspecified fracture of sacrum, initial encounter for closed fracture: Secondary | ICD-10-CM | POA: Diagnosis not present

## 2023-02-18 DIAGNOSIS — S3992XA Unspecified injury of lower back, initial encounter: Secondary | ICD-10-CM | POA: Diagnosis not present

## 2023-02-18 DIAGNOSIS — S22089A Unspecified fracture of T11-T12 vertebra, initial encounter for closed fracture: Secondary | ICD-10-CM | POA: Diagnosis not present

## 2023-02-18 DIAGNOSIS — R102 Pelvic and perineal pain: Secondary | ICD-10-CM | POA: Diagnosis not present

## 2023-02-18 DIAGNOSIS — Y92129 Unspecified place in nursing home as the place of occurrence of the external cause: Secondary | ICD-10-CM | POA: Diagnosis not present

## 2023-02-18 DIAGNOSIS — W19XXXA Unspecified fall, initial encounter: Secondary | ICD-10-CM | POA: Insufficient documentation

## 2023-02-18 DIAGNOSIS — S32511A Fracture of superior rim of right pubis, initial encounter for closed fracture: Secondary | ICD-10-CM | POA: Diagnosis not present

## 2023-02-18 DIAGNOSIS — M545 Low back pain, unspecified: Secondary | ICD-10-CM | POA: Diagnosis not present

## 2023-02-18 DIAGNOSIS — S32591A Other specified fracture of right pubis, initial encounter for closed fracture: Secondary | ICD-10-CM | POA: Diagnosis not present

## 2023-02-18 DIAGNOSIS — S32029A Unspecified fracture of second lumbar vertebra, initial encounter for closed fracture: Secondary | ICD-10-CM | POA: Diagnosis not present

## 2023-02-18 DIAGNOSIS — S32591S Other specified fracture of right pubis, sequela: Secondary | ICD-10-CM | POA: Diagnosis not present

## 2023-02-18 DIAGNOSIS — M25551 Pain in right hip: Secondary | ICD-10-CM | POA: Diagnosis not present

## 2023-02-18 DIAGNOSIS — Y9301 Activity, walking, marching and hiking: Secondary | ICD-10-CM | POA: Insufficient documentation

## 2023-02-18 MED ORDER — OXYCODONE HCL 5 MG PO TABS
5.0000 mg | ORAL_TABLET | Freq: Once | ORAL | Status: AC
  Administered 2023-02-18: 5 mg via ORAL
  Filled 2023-02-18: qty 1

## 2023-02-18 MED ORDER — OXYCODONE HCL 5 MG PO TABS: 2.5000 mg | ORAL_TABLET | ORAL | 0 refills | Status: DC | PRN

## 2023-02-18 NOTE — ED Triage Notes (Signed)
[facility] by son after a fall  yesterday. Followed up at [facility] for XR pelvis - 2 views but could not get a good read due to inability to roll over.   Encouraged to ER for further evaluation and new XR/ CT.

## 2023-02-18 NOTE — Telephone Encounter (Signed)
Called patient to schedule a consultation w. Alison. No answer, LVM for a return call.  

## 2023-02-18 NOTE — Discharge Instructions (Addendum)
1. Nondisplaced fractures of the right superior and inferior pubic rami. 2. Minimally displaced fracture of the inferior right sacral ala extending to the right SI joint. 3. Mild compression fractures of superior endplates of T12 and L2. 4. Mild bilateral hydronephrosis. No stone identified. 5. Sigmoid diverticulosis. No bowel obstruction. Normal appendix. 6.  Aortic Atherosclerosis (ICD10-I70.0).

## 2023-02-18 NOTE — ED Provider Notes (Signed)
[place] EMERGENCY DEPARTMENT AT [facility] Provider Note   CSN: [id number] Arrival date & time: 02/18/23  1856     History Chief Complaint  Patient presents with   Fall    HPI EMARIE PAUL is a 89 y.o. female presenting for chief complaint of fall.  89 year old female who fell at the facility walking to the dinner.  Happened last night, attempted conservative management today but is having substantial pain with ambulation.Micah Flesher to urgent care but they could not get x-rays because she could not get on their table.  Referred back to emergency room for further care and management.   Patient's recorded medical, surgical, social, medication list and allergies were reviewed in the Snapshot window as part of the initial history.   Review of Systems   Review of Systems  Constitutional:  Negative for chills and fever.  HENT:  Negative for ear pain and sore throat.   Eyes:  Negative for pain and visual disturbance.  Respiratory:  Negative for cough and shortness of breath.   Cardiovascular:  Negative for chest pain and palpitations.  Gastrointestinal:  Negative for abdominal pain and vomiting.  Genitourinary:  Negative for dysuria and hematuria.  Musculoskeletal:  Negative for arthralgias and back pain.  Skin:  Negative for color change and rash.  Neurological:  Negative for seizures and syncope.  All other systems reviewed and are negative.   Physical Exam Updated Vital Signs BP (!) 209/76   Pulse 89   Temp 98.2 F (36.8 C)   Resp (!) 22   Ht  (1.676 m)   Wt 63 kg   SpO2 96%   BMI 22.44 kg/m  Physical Exam Vitals and nursing note reviewed.  Constitutional:      General: She is not in acute distress.    Appearance: She is well-developed.  HENT:     Head: Normocephalic and atraumatic.  Eyes:     Conjunctiva/sclera: Conjunctivae normal.  Cardiovascular:     Rate and Rhythm: Normal rate and regular rhythm.     Heart sounds: No murmur  heard. Pulmonary:     Effort: Pulmonary effort is normal. No respiratory distress.     Breath sounds: Normal breath sounds.  Abdominal:     General: There is no distension.     Palpations: Abdomen is soft.     Tenderness: There is no abdominal tenderness. There is no right CVA tenderness or left CVA tenderness.  Musculoskeletal:        General: Tenderness and signs of injury present. No swelling. Normal range of motion.     Cervical back: Neck supple.  Skin:    General: Skin is warm and dry.  Neurological:     General: No focal deficit present.     Mental Status: She is alert and oriented to person, place, and time. Mental status is at baseline.     Cranial Nerves: No cranial nerve deficit.      ED Course/ Medical Decision Making/ A&P    Procedures Procedures   Medications Ordered in ED Medications  oxyCODONE (Oxy IR/ROXICODONE) immediate release tablet 5 mg (5 mg Oral Given 02/18/23 2001)   Medical Decision Making:    BRITTAY MOGLE is a 89 y.o. female who presented to the ED today with a moderate mechanisma trauma, detailed above.    Additional history discussed with patient's family/caregivers.  Patient placed on continuous vitals and telemetry monitoring while in ED which was reviewed periodically.   Given this  mechanism of trauma, a full physical exam was performed. Notably, patient was HDS in NAD.   Reviewed and confirmed nursing documentation for past medical history, family history, social history.    Initial Assessment/Plan:   This is a patient presenting with a moderate mechanism trauma.  As such, I have considered intracranial injuries including intracranial hemorrhage, intrathoracic injuries including blunt myocardial or blunt lung injury, blunt abdominal injuries including aortic dissection, bladder injury, spleen injury, liver injury and I have considered orthopedic injuries including extremity or spinal injury.  With the patient's presentation of moderate  mechanism trauma but an otherwise reassuring exam, patient warrants targeted evaluation for potential traumatic injuries. Will proceed with targeted evaluation for potential injuries. Will proceed with CT AP w/o to evaluate lumbar spine and pelvis. Objective evaluation resulted with extensive, nonemergent pathology.   1.)Nondisplaced fractures of the right superior and inferior pubic rami. 2. Minimally displaced fracture of the inferior right sacral ala extending to the right SI joint. 3. Mild compression fractures of superior endplates of T12 and L2. 4. Mild bilateral hydronephrosis. No stone identified. 5. Sigmoid diverticulosis. No bowel obstruction. Normal appendix. 6.  Aortic Atherosclerosis (ICD10-I70.0). Final Reassessment and Plan:   On reassessment, patient is comfortable in bed.  Pain is well-controlled.  She is sitting upright feels like she would be able to walk comfortably.  We discussed all of her findings.  These are all stable fractures without concern for emergent need for surgery especially in the setting of 36 hours since the initial traumatic injury, improvement in syndrome here in the emergency room. We discussed hospital observation versus outpatient follow-up with orthopedics and family felt comfortable with outpatient follow-up with orthopedics for further care and management. Will start pain control in emergency room, plan for patient to transition to Tylenol over the next 72 hours and follow-up with orthopedics at that time. Given overall well appearance, ambulation status I believe patient stable for outpatient care management without acute indication for further intervention in the emergency room.   Disposition:  I have considered need for hospitalization, however, considering all of the above, I believe this patient is stable for discharge at this time.  Patient/family educated about specific return precautions for given chief complaint and symptoms.  Patient/family  educated about follow-up with PCP.     Patient/family expressed understanding of return precautions and need for follow-up. Patient spoken to regarding all imaging and laboratory results and appropriate follow up for these results. All education provided in verbal form with additional information in written form. Time was allowed for answering of patient questions. Patient discharged.    Emergency Department Medication Summary:   Medications  oxyCODONE (Oxy IR/ROXICODONE) immediate release tablet 5 mg (5 mg Oral Given 02/18/23 2001)           Clinical Impression:  1. Fall, initial encounter   2. Closed fracture of twelfth thoracic vertebra, unspecified fracture morphology, initial encounter   3. Closed fracture of second lumbar vertebra, unspecified fracture morphology, initial encounter   4. Closed fracture of sacrum, unspecified fracture morphology, initial encounter   5. Fracture of multiple pubic rami, right, sequela      Discharge   Final Clinical Impression(s) / ED Diagnoses Final diagnoses:  Fall, initial encounter  Closed fracture of twelfth thoracic vertebra, unspecified fracture morphology, initial encounter  Closed fracture of second lumbar vertebra, unspecified fracture morphology, initial encounter  Closed fracture of sacrum, unspecified fracture morphology, initial encounter  Fracture of multiple pubic rami, right, sequela  Rx / DC Orders ED Discharge Orders          Ordered    oxyCODONE (ROXICODONE) 5 MG immediate release tablet  Every 4 hours PRN        02/18/23 2132              Glyn Ade, MD 02/18/23 2134

## 2023-02-23 ENCOUNTER — Encounter: Payer: Self-pay | Admitting: Nurse Practitioner

## 2023-02-23 ENCOUNTER — Non-Acute Institutional Stay (SKILLED_NURSING_FACILITY): Payer: Medicare Other | Admitting: Nurse Practitioner

## 2023-02-23 DIAGNOSIS — R296 Repeated falls: Secondary | ICD-10-CM | POA: Diagnosis not present

## 2023-02-23 DIAGNOSIS — Z8673 Personal history of transient ischemic attack (TIA), and cerebral infarction without residual deficits: Secondary | ICD-10-CM | POA: Diagnosis not present

## 2023-02-23 DIAGNOSIS — Q6211 Congenital occlusion of ureteropelvic junction: Secondary | ICD-10-CM | POA: Diagnosis not present

## 2023-02-23 DIAGNOSIS — I10 Essential (primary) hypertension: Secondary | ICD-10-CM | POA: Diagnosis not present

## 2023-02-23 DIAGNOSIS — K588 Other irritable bowel syndrome: Secondary | ICD-10-CM | POA: Diagnosis not present

## 2023-02-23 DIAGNOSIS — M545 Low back pain, unspecified: Secondary | ICD-10-CM

## 2023-02-23 DIAGNOSIS — S22089A Unspecified fracture of T11-T12 vertebra, initial encounter for closed fracture: Secondary | ICD-10-CM | POA: Diagnosis not present

## 2023-02-23 DIAGNOSIS — I428 Other cardiomyopathies: Secondary | ICD-10-CM | POA: Diagnosis not present

## 2023-02-23 DIAGNOSIS — N1330 Unspecified hydronephrosis: Secondary | ICD-10-CM | POA: Insufficient documentation

## 2023-02-23 DIAGNOSIS — S3282XD Multiple fractures of pelvis without disruption of pelvic ring, subsequent encounter for fracture with routine healing: Secondary | ICD-10-CM

## 2023-02-23 DIAGNOSIS — E1169 Type 2 diabetes mellitus with other specified complication: Secondary | ICD-10-CM | POA: Diagnosis not present

## 2023-02-23 DIAGNOSIS — K5732 Diverticulitis of large intestine without perforation or abscess without bleeding: Secondary | ICD-10-CM

## 2023-02-23 DIAGNOSIS — S22009D Unspecified fracture of unspecified thoracic vertebra, subsequent encounter for fracture with routine healing: Secondary | ICD-10-CM | POA: Diagnosis not present

## 2023-02-23 DIAGNOSIS — E1129 Type 2 diabetes mellitus with other diabetic kidney complication: Secondary | ICD-10-CM | POA: Insufficient documentation

## 2023-02-23 DIAGNOSIS — I5022 Chronic systolic (congestive) heart failure: Secondary | ICD-10-CM | POA: Diagnosis not present

## 2023-02-23 DIAGNOSIS — E559 Vitamin D deficiency, unspecified: Secondary | ICD-10-CM

## 2023-02-23 DIAGNOSIS — I700 Atherosclerosis of aorta: Secondary | ICD-10-CM | POA: Diagnosis not present

## 2023-02-23 DIAGNOSIS — E119 Type 2 diabetes mellitus without complications: Secondary | ICD-10-CM | POA: Diagnosis not present

## 2023-02-23 DIAGNOSIS — E1122 Type 2 diabetes mellitus with diabetic chronic kidney disease: Secondary | ICD-10-CM

## 2023-02-23 DIAGNOSIS — Z510 Encounter for antineoplastic radiation therapy: Secondary | ICD-10-CM | POA: Diagnosis not present

## 2023-02-23 DIAGNOSIS — D0511 Intraductal carcinoma in situ of right breast: Secondary | ICD-10-CM | POA: Diagnosis not present

## 2023-02-23 DIAGNOSIS — E538 Deficiency of other specified B group vitamins: Secondary | ICD-10-CM

## 2023-02-23 DIAGNOSIS — Z794 Long term (current) use of insulin: Secondary | ICD-10-CM

## 2023-02-23 DIAGNOSIS — Z95810 Presence of automatic (implantable) cardiac defibrillator: Secondary | ICD-10-CM | POA: Diagnosis not present

## 2023-02-23 DIAGNOSIS — W19XXXD Unspecified fall, subsequent encounter: Secondary | ICD-10-CM | POA: Diagnosis not present

## 2023-02-23 DIAGNOSIS — Z9889 Other specified postprocedural states: Secondary | ICD-10-CM | POA: Diagnosis not present

## 2023-02-23 DIAGNOSIS — Z0000 Encounter for general adult medical examination without abnormal findings: Secondary | ICD-10-CM | POA: Diagnosis not present

## 2023-02-23 DIAGNOSIS — N183 Chronic kidney disease, stage 3 unspecified: Secondary | ICD-10-CM

## 2023-02-23 DIAGNOSIS — I2510 Atherosclerotic heart disease of native coronary artery without angina pectoris: Secondary | ICD-10-CM | POA: Diagnosis not present

## 2023-02-23 DIAGNOSIS — S329XXA Fracture of unspecified parts of lumbosacral spine and pelvis, initial encounter for closed fracture: Secondary | ICD-10-CM | POA: Insufficient documentation

## 2023-02-23 DIAGNOSIS — D649 Anemia, unspecified: Secondary | ICD-10-CM | POA: Diagnosis not present

## 2023-02-23 DIAGNOSIS — E7800 Pure hypercholesterolemia, unspecified: Secondary | ICD-10-CM | POA: Diagnosis not present

## 2023-02-23 DIAGNOSIS — E785 Hyperlipidemia, unspecified: Secondary | ICD-10-CM

## 2023-02-23 DIAGNOSIS — Z950 Presence of cardiac pacemaker: Secondary | ICD-10-CM | POA: Diagnosis not present

## 2023-02-23 DIAGNOSIS — S3210XD Unspecified fracture of sacrum, subsequent encounter for fracture with routine healing: Secondary | ICD-10-CM | POA: Diagnosis not present

## 2023-02-23 DIAGNOSIS — M8580 Other specified disorders of bone density and structure, unspecified site: Secondary | ICD-10-CM | POA: Diagnosis not present

## 2023-02-23 DIAGNOSIS — Z170 Estrogen receptor positive status [ER+]: Secondary | ICD-10-CM | POA: Diagnosis not present

## 2023-02-23 DIAGNOSIS — E11649 Type 2 diabetes mellitus with hypoglycemia without coma: Secondary | ICD-10-CM | POA: Diagnosis not present

## 2023-02-23 NOTE — Assessment & Plan Note (Signed)
Mild bilateral hydronephrosis, no stones

## 2023-02-23 NOTE — Assessment & Plan Note (Signed)
02/18/23 mechanical fall, resulted in multiple fxs, ED evaluated.

## 2023-02-23 NOTE — Assessment & Plan Note (Addendum)
Continue insulin 70/30, update Hgb a1c

## 2023-02-23 NOTE — Assessment & Plan Note (Signed)
,   taking ASA, Atorvastatin

## 2023-02-23 NOTE — Assessment & Plan Note (Signed)
Mild compression fx of superior endplates of T12, L2. Prn Tylenol, Tramadol, Oxycodone available for pain.

## 2023-02-23 NOTE — Assessment & Plan Note (Signed)
Continue Atorvastatin, update lipid panel, Hgb A1c, TSH,

## 2023-02-23 NOTE — Assessment & Plan Note (Addendum)
multiple fractures sustained from fall 02/18/23. She was found to have pelvic fractures: nondisplaced fxs of R superior and inferior pubic rami, minimally displaced fx of the inferior R sacral ala extended to the R SIJ, obtain DEXA, wants to schedule Tylenol  II 9am, 9pm, may refuse it

## 2023-02-23 NOTE — Assessment & Plan Note (Signed)
Compensated clinically, continue Furosemide, update CMP/eGFR

## 2023-02-23 NOTE — Assessment & Plan Note (Signed)
Blood pressure is controlled, continue Metoprolol, Olmesartan.  

## 2023-02-23 NOTE — Assessment & Plan Note (Signed)
Continue Vit D, update Vit D level.

## 2023-02-23 NOTE — Assessment & Plan Note (Addendum)
Sigmoid diverticulitis/IBS, no diarrhea presently, requested to dc Questran, continue Bentyl, Imodium

## 2023-02-23 NOTE — Assessment & Plan Note (Addendum)
Continue Vit B12, update Vit B12 level, CBC/diff 

## 2023-02-23 NOTE — Progress Notes (Signed)
Location:   SNF [facility] Room Number: 22 Place of Service:  SNF (31) Provider: Arna Snipe Jonay Hitchcock NP  Creola Corn, MD  Patient Care Team: Creola Corn, MD as PCP - General (Internal Medicine) Abigail Miyamoto, MD as Consulting Physician (General Surgery) Rachel Moulds, MD as Consulting Physician (Hematology and Oncology) Dorothy Puffer, MD as Consulting Physician (Radiation Oncology) Pershing Proud, RN as Oncology Nurse Navigator Donnelly Angelica, RN as Oncology Nurse Navigator  Extended Emergency Contact Information Primary Emergency Contact: [name] Address: [address]          [place], [place] [address] D[place] of [place] Home Phone: [phone] Mobile Phone: [phone] Relation: Son  Code Status: DNR Goals of care: Advanced Directive information    02/18/2023    7:27 PM  Advanced Directives  Does Patient Have a Medical Advance Directive? No  Would patient like information on creating a medical advance directive? No - Patient declined     Chief Complaint  Patient presents with   Acute Visit    Medication review    HPI:  Pt is a 89 y.o. female seen today for an acute visit for medication review The patient was admitted to SNF [facility] for therapy following multiple fractures sustained from fall 02/18/23. She was found to have pelvic fractures: nondisplaced fxs of R superior and inferior pubic rami, minimally displaced fx of the inferior R sacral ala extended to the R SIJ. Mild compression fx of superior endplates of T12, L2. Prn Tylenol, Tramadol, Oxycodone available for pain.  Fall, mechanical,  Mild bilateral hydronephrosis, no stones Sigmoid diverticulitis/IBS taking Bentyl, Imodium Aortic atherosclerosis, taking ASA, Atorvastatin HLD, taking Atorvastatin Vit B12 deficiency, takes Vit B12 Vit D deficiency, on Vit D CHF, taking Furosemide T2DM/renal complication, taking insulin 70/30  HTN, taking Metoprolol, Olmesartan  S/p R breat cancer  lumpectomy, f/u surgeon Past Medical History:  Diagnosis Date   Biventricular ICD (implantable cardiac defibrillator) in place    downgraded from CRT-D to CRT-P 08/30/22   Breast cancer    Cellulitis    Change in bowel habits    CHF (congestive heart failure)    Colon polyps    Diabetes mellitus    Type II   Diarrhea    Dyslipidemia    HOH (hard of hearing)    left   HTN (hypertension)    LBBB (left bundle branch block)    chronic   Meniere's disease    Nonischemic cardiomyopathy 2006   Presence of permanent cardiac pacemaker    T wave oversensing 01/17/2014   Ulcerative colitis    Varicose veins    4 lazer  treatment each leg   Past Surgical History:  Procedure Laterality Date   ABDOMINAL HYSTERECTOMY     BIV PACEMAKER GENERATOR CHANGEOUT N/A 08/30/2022   Procedure: BIV PACEMAKER GENERATOR CHANGEOUT;  Surgeon: Duke Salvia, MD;  Location: [facility] INVASIVE CV LAB;  Service: Cardiovascular;  Laterality: N/A;   BREAST LUMPECTOMY WITH RADIOACTIVE SEED LOCALIZATION Right 01/05/2023   Procedure: RIGHT BREAST BRACKETED LUMPECTOMY WITH RADIOACTIVE SEED LOCALIZATION;  Surgeon: Abigail Miyamoto, MD;  Location: [facility] OR;  Service: General;  Laterality: Right;   CARDIAC CATHETERIZATION  08/2011   CHOLECYSTECTOMY     COLONOSCOPY     EP Study  08/22/08   IMPLANTABLE CARDIOVERTER DEFIBRILLATOR GENERATOR CHANGE N/A 08/22/2013   Procedure: IMPLANTABLE CARDIOVERTER DEFIBRILLATOR GENERATOR CHANGE;  Surgeon: Duke Salvia, MD;  Location: [facility] CATH LAB;  Service: Cardiovascular;  Laterality: N/A;   KNEE  ARTHROSCOPY Left 04/14/2016   Procedure: LEFT KNEE ARTHROSCOPY, PARTIAL MEDIAL AND PARTIAL LATERAL MENISCECTOMY, MEDIAL PLICA;  Surgeon: Jodi Geralds, MD;  Location: [facility] OR;  Service: Orthopedics;  Laterality: Left;   mastoid sugery     in the setting of Meniere's disease   TONSILLECTOMY AND ADENOIDECTOMY      Allergies  Allergen Reactions   Carvedilol Other (See Comments)    Pt does not remember  reaction  Other Reaction(s): Unknown   Digoxin Other (See Comments)    Dig toxicity   Vancomycin Other (See Comments)    ("Red Jeana Kersting Syndrome") Pt states she turned red and was itching  Unknown  ("Red Tajah Schreiner Syndrome") Pt states she turned red and was itching    Unknown    ("Red Tequilla Cousineau Syndrome") Pt states she turned red and was itching   Pseudoephedrine Other (See Comments)    Makes skin feel like its crawling  Makes skin feel like its crawling Unknown Makes skin feel like its crawling  Makes skin feel like its crawling Unknown  Makes skin feel like its crawling    Makes skin feel like its crawling Unknown Makes skin feel like its crawling    Makes skin feel like its crawling, Unknown    Makes skin feel like its crawling Unknown    Makes skin feel like its crawling    Allergies as of 02/23/2023       Reactions   Carvedilol Other (See Comments)   Pt does not remember reaction Other Reaction(s): Unknown   Digoxin Other (See Comments)   Dig toxicity   Vancomycin Other (See Comments)   ("Red Jahmir Salo Syndrome") Pt states she turned red and was itching Unknown  ("Red Grant Swager Syndrome") Pt states she turned red and was itching    Unknown    ("Red Shaelin Lalley Syndrome") Pt states she turned red and was itching   Pseudoephedrine Other (See Comments)   Makes skin feel like its crawling Makes skin feel like its crawling Unknown Makes skin feel like its crawling  Makes skin feel like its crawling Unknown  Makes skin feel like its crawling    Makes skin feel like its crawling Unknown Makes skin feel like its crawling    Makes skin feel like its crawling, Unknown    Makes skin feel like its crawling Unknown    Makes skin feel like its crawling        Medication List        Accurate as of February 23, 2023 12:13 PM. If you have any questions, ask your nurse or doctor.          acetaminophen 500 MG tablet Commonly known as: TYLENOL Take 500 mg by mouth every 8 (eight) hours as needed for  headache.   aspirin EC 81 MG tablet Take 81 mg by mouth daily. Swallow whole.   atorvastatin 40 MG tablet Commonly known as: LIPITOR Take 40 mg by mouth daily.   cholestyramine 4 g packet Commonly known as: QUESTRAN Take 1 packet (4 g total) by mouth daily. What changed:  when to take this reasons to take this   CRANBERRY CONCENTRATE PO Take 2 tablets by mouth daily.   cyanocobalamin 1000 MCG tablet Commonly known as: VITAMIN B12 Take 1,000 mcg by mouth once a week.   dicyclomine 10 MG capsule Commonly known as: BENTYL TAKE 1 CAPSULE BY MOUTH THREE TIMES DAILY 30 MINUTES BEFORE MEALS   furosemide 20 MG tablet Commonly known as: LASIX Take 1 tablet (20  mg total) by mouth daily. alternating (40 mg) every other day.   insulin aspart protamine - aspart (70-30) 100 UNIT/ML FlexPen Commonly known as: NOVOLOG 70/30 MIX Inject 32 Units into the skin 2 (two) times daily with a meal. May Inject 15 units if blood sugar is greater than 200   loperamide 2 MG tablet Commonly known as: IMODIUM A-D Take 2 mg by mouth as needed for diarrhea or loose stools.   metoprolol succinate 100 MG 24 hr tablet Commonly known as: TOPROL-XL Take 100 mg by mouth 2 (two) times daily.   olmesartan 40 MG tablet Commonly known as: BENICAR Take 40 mg by mouth daily.   OneTouch Delica Plus Lancet30G Misc CHECK BLOOD SUGAR TWICE DAILY   OneTouch Verio test strip Generic drug: glucose blood 2 (two) times daily.   oxyCODONE 5 MG immediate release tablet Commonly known as: Roxicodone Take 0.5-1 tablets (2.5-5 mg total) by mouth every 4 (four) hours as needed for severe pain.   potassium chloride 10 MEQ tablet Commonly known as: KLOR-CON M Take 10 mEq by mouth 2 (two) times daily.   traMADol 50 MG tablet Commonly known as: ULTRAM Take 1 tablet (50 mg total) by mouth every 6 (six) hours as needed.   Vitamin D-3 25 MCG (1000 UT) Caps Take 2,000 Units by mouth daily.   vitamin E 180 MG (400  UNITS) capsule Take 400 Units by mouth daily.        Review of Systems  Constitutional:  Positive for appetite change and fatigue. Negative for fever.  HENT:  Positive for hearing loss. Negative for congestion and trouble swallowing.   Eyes:  Negative for visual disturbance.  Respiratory:  Negative for cough, chest tightness and wheezing.   Cardiovascular:  Negative for chest pain, palpitations and leg swelling.  Gastrointestinal:  Negative for abdominal pain, constipation, diarrhea, nausea and vomiting.  Genitourinary:  Negative for dysuria and urgency.  Musculoskeletal:  Positive for arthralgias, back pain and gait problem.  Skin:  Negative for color change.  Neurological:  Negative for tremors and headaches.  Psychiatric/Behavioral:  Negative for behavioral problems and sleep disturbance. The patient is not nervous/anxious.     Immunization History  Administered Date(s) Administered   Influenza, High Dose Seasonal PF 10/06/2016, 10/05/2017, 09/14/2018   Influenza,inj,Quad PF,6+ Mos 10/02/2014   Tdap 09/29/2022   Pertinent  Health Maintenance Due  Topic Date Due   FOOT EXAM  Never done   OPHTHALMOLOGY EXAM  Never done   DEXA SCAN  Never done   INFLUENZA VACCINE  06/02/2023   HEMOGLOBIN A1C  07/03/2023      11/13/2021    6:11 PM 03/02/2022    1:19 PM 08/16/2022   11:20 AM 08/30/2022    8:22 AM 09/29/2022    3:01 PM  Fall Risk  Falls in the past year?  1 0    Was there an injury with Fall?  1     Fall Risk Category Calculator  3     Fall Risk Category (Retired)  High     (RETIRED) Patient Fall Risk Level Moderate fall risk Low fall risk Low fall risk Low fall risk Moderate fall risk  Fall risk Follow up   Falls evaluation completed     Functional Status Survey:    Vitals:   02/23/23 1049  BP: (!) 147/75  Pulse: 73  Resp: 17  Temp: (!) 97.3 F (36.3 C)  SpO2: 96%  Weight: 124 lb (56.2 kg)   Body mass  index is 20.01 kg/m. Physical Exam Vitals and nursing  note reviewed.  Constitutional:      Appearance: Normal appearance.  HENT:     Head: Normocephalic and atraumatic.     Nose: Nose normal.     Mouth/Throat:     Mouth: Mucous membranes are moist.  Eyes:     Extraocular Movements: Extraocular movements intact.     Conjunctiva/sclera: Conjunctivae normal.     Pupils: Pupils are equal, round, and reactive to light.  Cardiovascular:     Rate and Rhythm: Normal rate and regular rhythm.     Heart sounds: No murmur heard.    Comments: Pacemaker left upper chest(defibrillator was removed per patient's request in the past) Pulmonary:     Effort: Pulmonary effort is normal.     Breath sounds: No wheezing or rales.  Abdominal:     General: Bowel sounds are normal.     Palpations: Abdomen is soft.     Tenderness: There is no abdominal tenderness.  Musculoskeletal:        General: Tenderness present.     Cervical back: Normal range of motion and neck supple.     Right lower leg: No edema.     Left lower leg: No edema.     Comments: Lower back, pelvic/thigh pain with movement/weight bearing.   Skin:    General: Skin is warm and dry.     Comments: S/p R breat cancer lumpectomy, no apparent edema R arm.   Neurological:     General: No focal deficit present.     Mental Status: She is alert and oriented to person, place, and time. Mental status is at baseline.     Motor: No weakness.     Gait: Gait abnormal.  Psychiatric:        Mood and Affect: Mood normal.        Behavior: Behavior normal.        Thought Content: Thought content normal.     Labs reviewed: Recent Labs    08/02/22 1559 12/08/22 0801 12/31/22 1430  NA 144 140 140  K 3.8 3.3* 3.6  CL 100 102 101  CO2 26 27 26   GLUCOSE 124* 185* 82  BUN 15 15 13   CREATININE 0.89 1.02* 0.94  CALCIUM 9.2 9.0 10.0   Recent Labs    03/02/22 1434 12/08/22 0801  AST  --  19  ALT  --  13  ALKPHOS  --  67  BILITOT  --  0.8  PROT 7.4 6.6  ALBUMIN  --  4.0   Recent Labs     08/02/22 1559 12/08/22 0801 12/31/22 1430  WBC 7.2 5.9 7.0  NEUTROABS  --  4.4  --   HGB 13.8 14.5 14.7  HCT 40.3 42.2 43.0  MCV 95 93.6 95.3  PLT 145* 153 178   Lab Results  Component Value Date   TSH 1.23 07/20/2011   Lab Results  Component Value Date   HGBA1C 7.5 (H) 12/31/2022   Lab Results  Component Value Date   CHOL 152 11/10/2020   HDL 57 11/10/2020   LDLCALC 68 11/10/2020   TRIG 159 (H) 11/10/2020   CHOLHDL 2.7 11/10/2020    Significant Diagnostic Results in last 30 days:  CT ABDOMEN PELVIS WO CONTRAST  Result Date: 02/18/2023 CLINICAL DATA:  Blunt abdominal trauma. EXAM: CT ABDOMEN AND PELVIS WITHOUT CONTRAST TECHNIQUE: Multidetector CT imaging of the abdomen and pelvis was performed following the standard protocol without IV contrast. RADIATION  DOSE REDUCTION: This exam was performed according to the departmental dose-optimization program which includes automated exposure control, adjustment of the mA and/or kV according to patient size and/or use of iterative reconstruction technique. COMPARISON:  None Available. FINDINGS: Evaluation of this exam is limited in the absence of intravenous contrast. Lower chest: The visualized lung bases are clear. Cardiac pacemaker wires noted. No intra-abdominal free air or free fluid. Hepatobiliary: The liver is unremarkable. No biliary dilatation. Cholecystectomy. No retained calcified stone noted in the central CBD. Pancreas: The pancreas is unremarkable for patient's age. No active inflammatory changes. Spleen: Normal in size without focal abnormality. Adrenals/Urinary Tract: The adrenal glands are unremarkable. There is mild bilateral hydronephrosis. No stone identified. Several bilateral renal cysts measure up to 4.5 cm in the field of the right kidney. Additional subcentimeter hypodense lesions are too small to characterize. No imaging follow-up. The visualized ureters appear unremarkable. The urinary bladder is grossly unremarkable.  Stomach/Bowel: Moderate stool throughout the colon. There is sigmoid diverticulosis without active inflammatory changes. There is no bowel obstruction or active inflammation. The appendix is normal. Vascular/Lymphatic: Moderate aortoiliac atherosclerotic disease. The IVC is unremarkable. No portal venous gas. There is no adenopathy. Reproductive: Hysterectomy.  No adnexal masses. Other: None Musculoskeletal: There is mild compression fracture of superior endplate of T12 with less than 10% loss of vertebral body height. No retropulsion. Additionally there is an age indeterminate, possibly acute, mild compression fracture of superior endplate of L2 with approximately 10% loss of vertebral body height. There is nondisplaced fractures of the right superior and inferior pubic rami. There is minimally displaced fracture of inferior right sacral al a extending to the right SI joint. The bones are osteopenic.  Grade 1 L4-L5 anterolisthesis. IMPRESSION: 1. Nondisplaced fractures of the right superior and inferior pubic rami. 2. Minimally displaced fracture of the inferior right sacral ala extending to the right SI joint. 3. Mild compression fractures of superior endplates of T12 and L2. 4. Mild bilateral hydronephrosis. No stone identified. 5. Sigmoid diverticulosis. No bowel obstruction. Normal appendix. 6.  Aortic Atherosclerosis (ICD10-I70.0). Electronically Signed   By: Elgie Collard M.D.   On: 02/18/2023 20:51    Assessment/Plan: Closed pelvic fracture  multiple fractures sustained from fall 02/18/23. She was found to have pelvic fractures: nondisplaced fxs of R superior and inferior pubic rami, minimally displaced fx of the inferior R sacral ala extended to the R SIJ, obtain DEXA, wants to schedule Tylenol  II 9am, 9pm, may refuse it  Lower back pain Mild compression fx of superior endplates of T12, L2. Prn Tylenol, Tramadol, Oxycodone available for pain.   Fall 02/18/23 mechanical fall, resulted in  multiple fxs, ED evaluated.   Hydronephrosis Mild bilateral hydronephrosis, no stones  Sigmoid diverticulitis Sigmoid diverticulitis/IBS, no diarrhea presently, requested to dc Questran, continue Bentyl, Imodium  Aortic atherosclerosis , taking ASA, Atorvastatin  Hyperlipidemia associated with type 2 diabetes mellitus Continue Atorvastatin, update lipid panel, Hgb A1c, TSH,   Vitamin B12 deficiency Continue Vit B12, update Vit B12 level, CBC/diff  Vitamin D deficiency Continue Vit D, update Vit D level.   SYSTOLIC HEART FAILURE, CHRONIC Compensated clinically, continue Furosemide, update CMP/eGFR  HTN (hypertension) Blood pressure is controlled, continue Metoprolol, Olmesartan  Type 2 diabetes mellitus with renal complication Continue insulin 70/30, update Hgb a1c    Family/ staff Communication: plan of care reviewed with the patient and charge nurse   Labs/tests ordered:  CBC/diff, CMP/eGFR, TSH, lipid panel, Vit B12, Vit D, Hgb A1c. DEXA  Time  spend 35 minutes.

## 2023-02-24 ENCOUNTER — Non-Acute Institutional Stay (SKILLED_NURSING_FACILITY): Payer: Medicare Other | Admitting: Family Medicine

## 2023-02-24 DIAGNOSIS — Z95810 Presence of automatic (implantable) cardiac defibrillator: Secondary | ICD-10-CM

## 2023-02-24 DIAGNOSIS — D0511 Intraductal carcinoma in situ of right breast: Secondary | ICD-10-CM | POA: Diagnosis not present

## 2023-02-24 DIAGNOSIS — S3282XD Multiple fractures of pelvis without disruption of pelvic ring, subsequent encounter for fracture with routine healing: Secondary | ICD-10-CM | POA: Diagnosis not present

## 2023-02-24 DIAGNOSIS — I700 Atherosclerosis of aorta: Secondary | ICD-10-CM | POA: Diagnosis not present

## 2023-02-24 DIAGNOSIS — I10 Essential (primary) hypertension: Secondary | ICD-10-CM

## 2023-02-24 NOTE — Progress Notes (Signed)
Provider:  Jacalyn Lefevre, MD  Careteam: Patient Care Team: Creola Corn, MD as PCP - General (Internal Medicine) Abigail Miyamoto, MD as Consulting Physician (General Surgery) Rachel Moulds, MD as Consulting Physician (Hematology and Oncology) Dorothy Puffer, MD as Consulting Physician (Radiation Oncology) Pershing Proud, RN as Oncology Nurse Navigator Donnelly Angelica, RN as Oncology Nurse Navigator  PLACE OF SERVICE:  [facility]  Advanced Directive information    Allergies  Allergen Reactions   Carvedilol Other (See Comments)    Pt does not remember reaction  Other Reaction(s): Unknown   Digoxin Other (See Comments)    Dig toxicity   Vancomycin Other (See Comments)    ("Red Man Syndrome") Pt states she turned red and was itching  Unknown  ("Red Man Syndrome") Pt states she turned red and was itching    Unknown    ("Red Man Syndrome") Pt states she turned red and was itching   Pseudoephedrine Other (See Comments)    Makes skin feel like its crawling  Makes skin feel like its crawling Unknown Makes skin feel like its crawling  Makes skin feel like its crawling Unknown  Makes skin feel like its crawling    Makes skin feel like its crawling Unknown Makes skin feel like its crawling    Makes skin feel like its crawling, Unknown    Makes skin feel like its crawling Unknown    Makes skin feel like its crawling    No chief complaint on file.    HPI: Patient is a 89 y.o. female.  This is an admission note for PET/CT to [facility] skilled nursing.  She fell in the dining room on April 18 with resulting pain with walking.  She went to urgent care but they could not get x-rays because she cannot get home examining table.  She was then referred to the emergency room for further care.  X I will-rays there showed nondisplaced fracture of the right superior and inferior pubic rami as well as minimally displaced fracture of the inferior right sacral, mild  compression fractures of superior endplates of T12 and L2.  She was sent home to her residence in independent living Since that she did not have a surgical need.  Once back in her apartment, she had difficulty ambulating with increased pain and she is thus referred to skilled care for help with ADLs and rehab. Other medical problems include recent breast cancer with lumpectomy.  Also underwent recent downgrade from biventricular cardiac defibrillator to pacemaker.  She has a history of CHF, diabetes mellitus type 2, hypertension and hyperlipidemia.  Review of Systems:  Review of Systems  Constitutional: Negative.   HENT:  Positive for hearing loss.   Respiratory: Negative.    Cardiovascular: Negative.   Gastrointestinal: Negative.   Genitourinary: Negative.   Musculoskeletal:  Positive for back pain and falls.  Skin: Negative.   Psychiatric/Behavioral: Negative.    All other systems reviewed and are negative.   Past Medical History:  Diagnosis Date   Biventricular ICD (implantable cardiac defibrillator) in place    downgraded from CRT-D to CRT-P 08/30/22   Breast cancer    Cellulitis    Change in bowel habits    CHF (congestive heart failure)    Colon polyps    Diabetes mellitus    Type II   Diarrhea    Dyslipidemia    HOH (hard of hearing)    left   HTN (hypertension)    LBBB (left  bundle branch block)    chronic   Meniere's disease    Nonischemic cardiomyopathy 2006   Presence of permanent cardiac pacemaker    T wave oversensing 01/17/2014   Ulcerative colitis    Varicose veins    4 lazer  treatment each leg   Past Surgical History:  Procedure Laterality Date   ABDOMINAL HYSTERECTOMY     BIV PACEMAKER GENERATOR CHANGEOUT N/A 08/30/2022   Procedure: BIV PACEMAKER GENERATOR CHANGEOUT;  Surgeon: Duke Salvia, MD;  Location: [facility] INVASIVE CV LAB;  Service: Cardiovascular;  Laterality: N/A;   BREAST LUMPECTOMY WITH RADIOACTIVE SEED LOCALIZATION Right 01/05/2023    Procedure: RIGHT BREAST BRACKETED LUMPECTOMY WITH RADIOACTIVE SEED LOCALIZATION;  Surgeon: Abigail Miyamoto, MD;  Location: [facility] OR;  Service: General;  Laterality: Right;   CARDIAC CATHETERIZATION  08/2011   CHOLECYSTECTOMY     COLONOSCOPY     EP Study  08/22/08   IMPLANTABLE CARDIOVERTER DEFIBRILLATOR GENERATOR CHANGE N/A 08/22/2013   Procedure: IMPLANTABLE CARDIOVERTER DEFIBRILLATOR GENERATOR CHANGE;  Surgeon: Duke Salvia, MD;  Location: [facility] CATH LAB;  Service: Cardiovascular;  Laterality: N/A;   KNEE ARTHROSCOPY Left 04/14/2016   Procedure: LEFT KNEE ARTHROSCOPY, PARTIAL MEDIAL AND PARTIAL LATERAL MENISCECTOMY, MEDIAL PLICA;  Surgeon: Jodi Geralds, MD;  Location: [facility] OR;  Service: Orthopedics;  Laterality: Left;   mastoid sugery     in the setting of Meniere's disease   TONSILLECTOMY AND ADENOIDECTOMY     Social History:   reports that she has quit smoking. Her smoking use included cigarettes. She has never been exposed to tobacco smoke. She has never used smokeless tobacco. She reports that she does not drink alcohol and does not use drugs.  Family History  Problem Relation Age of Onset   Breast cancer Mother 60       met to lungs   Emphysema Father 66   Heart disease Father    Healthy Child    Lung cancer Cousin        maternal first cousin, she smoked   Colon cancer Neg Hx    Rectal cancer Neg Hx    Stomach cancer Neg Hx    Liver cancer Neg Hx    Esophageal cancer Neg Hx     Medications: Patient's Medications  New Prescriptions   No medications on file  Previous Medications   ACETAMINOPHEN (TYLENOL) 500 MG TABLET    Take 500 mg by mouth every 8 (eight) hours as needed for headache.   ASPIRIN EC 81 MG TABLET    Take 81 mg by mouth daily. Swallow whole.   ATORVASTATIN (LIPITOR) 40 MG TABLET    Take 40 mg by mouth daily.   CHOLECALCIFEROL (VITAMIN D-3) 25 MCG (1000 UT) CAPS    Take 2,000 Units by mouth daily.   CHOLESTYRAMINE (QUESTRAN) 4 G PACKET    Take 1 packet (4 g  total) by mouth daily.   CRANBERRY CONCENTRATE PO    Take 2 tablets by mouth daily.   DICYCLOMINE (BENTYL) 10 MG CAPSULE    TAKE 1 CAPSULE BY MOUTH THREE TIMES DAILY 30 MINUTES BEFORE MEALS   FUROSEMIDE (LASIX) 20 MG TABLET    Take 1 tablet (20 mg total) by mouth daily. alternating (40 mg) every other day.   INSULIN ASPART PROTAMINE - ASPART (NOVOLOG 70/30 MIX) (70-30) 100 UNIT/ML FLEXPEN    Inject 32 Units into the skin 2 (two) times daily with a meal. May Inject 15 units if blood sugar is greater than 200  LANCETS (ONETOUCH DELICA PLUS LANCET30G) MISC    CHECK BLOOD SUGAR TWICE DAILY   LOPERAMIDE (IMODIUM A-D) 2 MG TABLET    Take 2 mg by mouth as needed for diarrhea or loose stools.   METOPROLOL SUCCINATE (TOPROL-XL) 100 MG 24 HR TABLET    Take 100 mg by mouth 2 (two) times daily.   OLMESARTAN (BENICAR) 40 MG TABLET    Take 40 mg by mouth daily.   ONETOUCH VERIO TEST STRIP    2 (two) times daily.   OXYCODONE (ROXICODONE) 5 MG IMMEDIATE RELEASE TABLET    Take 0.5-1 tablets (2.5-5 mg total) by mouth every 4 (four) hours as needed for severe pain.   POTASSIUM CHLORIDE (K-DUR) 10 MEQ TABLET    Take 10 mEq by mouth 2 (two) times daily.   TRAMADOL (ULTRAM) 50 MG TABLET    Take 1 tablet (50 mg total) by mouth every 6 (six) hours as needed.   VITAMIN B-12 (CYANOCOBALAMIN) 1000 MCG TABLET    Take 1,000 mcg by mouth once a week.   VITAMIN E 400 UNIT CAPSULE    Take 400 Units by mouth daily.  Modified Medications   No medications on file  Discontinued Medications   No medications on file    Physical Exam:  There were no vitals filed for this visit. There is no height or weight on file to calculate BMI. Wt Readings from Last 3 Encounters:  02/23/23 124 lb (56.2 kg)  02/18/23 139 lb (63 kg)  02/15/23 130 lb 3.2 oz (59.1 kg)    Physical Exam Vitals and nursing note reviewed.  Constitutional:      Appearance: Normal appearance.  HENT:     Head: Normocephalic.     Mouth/Throat:     Mouth:  Mucous membranes are moist.     Pharynx: Oropharynx is clear.  Eyes:     Extraocular Movements: Extraocular movements intact.  Cardiovascular:     Rate and Rhythm: Normal rate and regular rhythm.  Pulmonary:     Effort: Pulmonary effort is normal.     Breath sounds: Normal breath sounds.  Abdominal:     General: Bowel sounds are normal.     Palpations: Abdomen is soft.  Musculoskeletal:     Comments: There is tenderness on palpation of the [place] bilateral  Neurological:     General: No focal deficit present.     Mental Status: She is alert and oriented to person, place, and time.  Psychiatric:        Mood and Affect: Mood normal.        Behavior: Behavior normal.     Labs reviewed: Basic Metabolic Panel: Recent Labs    08/02/22 1559 12/08/22 0801 12/31/22 1430  NA 144 140 140  K 3.8 3.3* 3.6  CL 100 102 101  CO2 26 27 26   GLUCOSE 124* 185* 82  BUN 15 15 13   CREATININE 0.89 1.02* 0.94  CALCIUM 9.2 9.0 10.0   Liver Function Tests: Recent Labs    03/02/22 1434 12/08/22 0801  AST  --  19  ALT  --  13  ALKPHOS  --  67  BILITOT  --  0.8  PROT 7.4 6.6  ALBUMIN  --  4.0   No results for input(s): "LIPASE", "AMYLASE" in the last 8760 hours. No results for input(s): "AMMONIA" in the last 8760 hours. CBC: Recent Labs    08/02/22 1559 12/08/22 0801 12/31/22 1430  WBC 7.2 5.9 7.0  NEUTROABS  --  4.4  --   HGB 13.8 14.5 14.7  HCT 40.3 42.2 43.0  MCV 95 93.6 95.3  PLT 145* 153 178   Lipid Panel: No results for input(s): "CHOL", "HDL", "LDLCALC", "TRIG", "CHOLHDL", "LDLDIRECT" in the last 8760 hours. TSH: No results for input(s): "TSH" in the last 8760 hours. A1C: Lab Results  Component Value Date   HGBA1C 7.5 (H) 12/31/2022     Assessment/Plan  1. Aortic atherosclerosis Patient treated with insulin, antihypertensive, and statin  2. Biventricular implantable cardioverter-defibrillator -[employer] [facility].  No chest pain no palpitations  3.  Multiple closed fractures of pelvis without disruption of pelvic ring with routine healing, subsequent encounter Probably a minimum of 3 weeks time for pubic fractures.  Weightbearing as tolerated and treat pain  4. Ductal carcinoma in situ (DCIS) of right breast Awaiting hormonal studies to see if she warrants further treatment but hopefully lumpectomy will be all she needs  5. Primary hypertension Blood pressure treated with olmesartan metoprolol furosemide    Jacalyn Lefevre, MD [facility] [phone]

## 2023-02-25 LAB — HEMOGLOBIN A1C: Hemoglobin A1C: 7.5

## 2023-02-25 LAB — HEPATIC FUNCTION PANEL
ALT: 10 U/L (ref 7–35)
AST: 16 (ref 13–35)
Alkaline Phosphatase: 62 (ref 25–125)
Bilirubin, Total: 1

## 2023-02-25 LAB — COMPREHENSIVE METABOLIC PANEL
Albumin: 3.8 (ref 3.5–5.0)
Calcium: 9 (ref 8.7–10.7)
Globulin: 2.5
eGFR: 68

## 2023-02-25 LAB — BASIC METABOLIC PANEL
BUN: 17 (ref 4–21)
CO2: 29 — AB (ref 13–22)
Chloride: 104 (ref 99–108)
Creatinine: 0.8 (ref 0.5–1.1)
Glucose: 127
Potassium: 3.7 mEq/L (ref 3.5–5.1)
Sodium: 143 (ref 137–147)

## 2023-02-25 LAB — TSH: TSH: 1.93 (ref 0.41–5.90)

## 2023-02-25 LAB — CBC AND DIFFERENTIAL
HCT: 38 (ref 36–46)
Hemoglobin: 12.9 (ref 12.0–16.0)
Neutrophils Absolute: 3546
Platelets: 179 10*3/uL (ref 150–400)
WBC: 5.3

## 2023-02-25 LAB — LIPID PANEL
Cholesterol: 116 (ref 0–200)
HDL: 35 (ref 35–70)
LDL Cholesterol: 58
Triglycerides: 144 (ref 40–160)

## 2023-02-25 LAB — CBC: RBC: 4.01 (ref 3.87–5.11)

## 2023-02-25 LAB — VITAMIN D 25 HYDROXY (VIT D DEFICIENCY, FRACTURES): Vit D, 25-Hydroxy: 41

## 2023-02-25 LAB — VITAMIN B12: Vitamin B-12: 1570

## 2023-02-28 ENCOUNTER — Ambulatory Visit: Payer: Medicare Other | Attending: Internal Medicine

## 2023-02-28 DIAGNOSIS — Z95810 Presence of automatic (implantable) cardiac defibrillator: Secondary | ICD-10-CM | POA: Diagnosis not present

## 2023-02-28 DIAGNOSIS — I5022 Chronic systolic (congestive) heart failure: Secondary | ICD-10-CM

## 2023-03-01 ENCOUNTER — Ambulatory Visit
Admission: RE | Admit: 2023-03-01 | Discharge: 2023-03-01 | Disposition: A | Discharge status: Home / Self Care | Payer: Medicare Other | Source: Ambulatory Visit | Attending: Radiation Oncology | Admitting: Radiation Oncology

## 2023-03-01 ENCOUNTER — Encounter: Payer: Self-pay | Admitting: Internal Medicine

## 2023-03-01 ENCOUNTER — Encounter: Payer: Self-pay | Admitting: Radiation Oncology

## 2023-03-01 DIAGNOSIS — D0511 Intraductal carcinoma in situ of right breast: Secondary | ICD-10-CM | POA: Diagnosis not present

## 2023-03-01 DIAGNOSIS — Z170 Estrogen receptor positive status [ER+]: Secondary | ICD-10-CM | POA: Diagnosis not present

## 2023-03-01 NOTE — Progress Notes (Signed)
Radiation Oncology         [phone] ________________________________   Outpatient Follow Up - Conducted via telephone at patient request.  I spoke with the patient to conduct this  visit via telephone. The patient was notified in advance and was offered an in person or telemedicine meeting to allow for face to face communication but instead preferred to proceed with a telephone visit.    Name: Grace Robinson        MRN: [id number]  Date of Service: 03/01/2023 DOB: 1925-12-10  WU:[id number], Jonny Ruiz, MD  Rachel Moulds, MD     REFERRING PHYSICIAN: Rachel Moulds, MD   DIAGNOSIS: The encounter diagnosis was Ductal carcinoma in situ (DCIS) of right breast.   HISTORY OF PRESENT ILLNESS: Grace Robinson is a 89 y.o. female originally seen in the multidisciplinary breast clinic for a new diagnosis of right breast cancer. The patient was noted to have nipple retraction and diagnostic imaging of her right breast showed a group of calcifications in the upper outer breast measuring up to 2.7 cm. An ultrasound also noted a mass in the 9:30 position measuring up to 6 mm and her axilla was negative for adenopathy. A biopsy showed high grade DCIS with necrosis and calcifications that was ER weakly positive, PR negative.   Since her last visit, the patient has undergone a right lumpectomy on 01/05/2023, this showed a grade 3 DCIS measuring 5 cm with necrosis and calcifications.  The margins were negative though the lateral margin was less than 0.5 mm.  She has been doing well from her surgery however at the facility where she resides and developed significant pain with ambulation, she was unable to get x-rays at an urgent care due to limited mobility to get onto the table in the emergency evaluation on 02/18/2023 included a CT abdomen pelvis without contrast which identified nondisplaced fractures of the right superior and inferior pubic rami, minimally displaced fracture of the inferior right sacral alla extending  to the right SI joint and mild compression fractures of T12 and L2.  No other acute findings were appreciated.  [facility].  She is going to start working with physical therapy today.  She and her son are contacted by phone to discuss adjuvant radiotherapy.  PREVIOUS RADIATION THERAPY: No   PAST MEDICAL HISTORY:  Past Medical History:  Diagnosis Date   Biventricular ICD (implantable cardiac defibrillator) in place    downgraded from CRT-D to CRT-P 08/30/22   Breast cancer (HCC)    Cellulitis    Change in bowel habits    CHF (congestive heart failure) (HCC)    Colon polyps    Diabetes mellitus    Type II   Diarrhea    Dyslipidemia    HOH (hard of hearing)    left   HTN (hypertension)    LBBB (left bundle branch block)    chronic   Meniere's disease    Nonischemic cardiomyopathy (HCC) 2006   Presence of permanent cardiac pacemaker    T wave oversensing 01/17/2014   Ulcerative colitis    Varicose veins    4 lazer  treatment each leg       PAST SURGICAL HISTORY: Past Surgical History:  Procedure Laterality Date   ABDOMINAL HYSTERECTOMY     BIV PACEMAKER GENERATOR CHANGEOUT N/A 08/30/2022   Procedure: BIV PACEMAKER GENERATOR CHANGEOUT;  Surgeon: Duke Salvia, MD;  Location: [facility]  INVASIVE CV LAB;  Service: Cardiovascular;  Laterality: N/A;   BREAST LUMPECTOMY WITH RADIOACTIVE SEED LOCALIZATION Right 01/05/2023   Procedure: RIGHT BREAST BRACKETED LUMPECTOMY WITH RADIOACTIVE SEED LOCALIZATION;  Surgeon: Abigail Miyamoto, MD;  Location: [facility] OR;  Service: General;  Laterality: Right;   CARDIAC CATHETERIZATION  08/2011   CHOLECYSTECTOMY     COLONOSCOPY     EP Study  08/22/08   IMPLANTABLE CARDIOVERTER DEFIBRILLATOR GENERATOR CHANGE N/A 08/22/2013   Procedure: IMPLANTABLE CARDIOVERTER DEFIBRILLATOR GENERATOR CHANGE;  Surgeon: Duke Salvia, MD;  Location: [facility] CATH LAB;  Service: Cardiovascular;  Laterality: N/A;    KNEE ARTHROSCOPY Left 04/14/2016   Procedure: LEFT KNEE ARTHROSCOPY, PARTIAL MEDIAL AND PARTIAL LATERAL MENISCECTOMY, MEDIAL PLICA;  Surgeon: Jodi Geralds, MD;  Location: [facility] OR;  Service: Orthopedics;  Laterality: Left;   mastoid sugery     in the setting of Meniere's disease   TONSILLECTOMY AND ADENOIDECTOMY       FAMILY HISTORY:  Family History  Problem Relation Age of Onset   Breast cancer Mother 59       met to lungs   Emphysema Father 76   Heart disease Father    Healthy Child    Lung cancer Cousin        maternal first cousin, she smoked   Colon cancer Neg Hx    Rectal cancer Neg Hx    Stomach cancer Neg Hx    Liver cancer Neg Hx    Esophageal cancer Neg Hx      SOCIAL HISTORY:  reports that she has quit smoking. Her smoking use included cigarettes. She has never been exposed to tobacco smoke. She has never used smokeless tobacco. She reports that she does not drink alcohol and does not use drugs. The patient is single and resides in [place]. She is a retired Engineer, site. She is accompanied by her son Ed who also lives nearby. She enjoys reading, and is doing physical therapy for balance given her meniere's disease, but outside of that feels like she can get around quite well (prior to her fall).   ALLERGIES: Carvedilol, Digoxin, Vancomycin, and Pseudoephedrine   MEDICATIONS:  Current Outpatient Medications  Medication Sig Dispense Refill   acetaminophen (TYLENOL) 500 MG tablet Take 500 mg by mouth every 8 (eight) hours as needed for headache.     aspirin EC 81 MG tablet Take 81 mg by mouth daily. Swallow whole.     atorvastatin (LIPITOR) 40 MG tablet Take 40 mg by mouth daily.  3   Cholecalciferol (VITAMIN D-3) 25 MCG (1000 UT) CAPS Take 2,000 Units by mouth daily.     cholestyramine (QUESTRAN) 4 g packet Take 1 packet (4 g total) by mouth daily. (Patient taking differently: Take 4 g by mouth daily as needed (diarrhea).) 90 each 2   CRANBERRY CONCENTRATE PO Take 2  tablets by mouth daily.     dicyclomine (BENTYL) 10 MG capsule TAKE 1 CAPSULE BY MOUTH THREE TIMES DAILY 30 MINUTES BEFORE MEALS 90 capsule 2   furosemide (LASIX) 20 MG tablet Take 1 tablet (20 mg total) by mouth daily. alternating (40 mg) every other day. 60 tablet 11   insulin aspart protamine - aspart (NOVOLOG 70/30 MIX) (70-30) 100 UNIT/ML FlexPen Inject 32 Units into the skin 2 (two) times daily with a meal. May Inject 15 units if blood sugar is greater than 200     Lancets (ONETOUCH DELICA PLUS LANCET30G) MISC CHECK BLOOD SUGAR TWICE DAILY     loperamide (IMODIUM A-D)  2 MG tablet Take 2 mg by mouth as needed for diarrhea or loose stools.     metoprolol succinate (TOPROL-XL) 100 MG 24 hr tablet Take 100 mg by mouth 2 (two) times daily.     olmesartan (BENICAR) 40 MG tablet Take 40 mg by mouth daily.  3   ONETOUCH VERIO test strip 2 (two) times daily.     oxyCODONE (ROXICODONE) 5 MG immediate release tablet Take 0.5-1 tablets (2.5-5 mg total) by mouth every 4 (four) hours as needed for severe pain. 16 tablet 0   potassium chloride (K-DUR) 10 MEQ tablet Take 10 mEq by mouth 2 (two) times daily.     traMADol (ULTRAM) 50 MG tablet Take 1 tablet (50 mg total) by mouth every 6 (six) hours as needed. 15 tablet 0   vitamin B-12 (CYANOCOBALAMIN) 1000 MCG tablet Take 1,000 mcg by mouth once a week.     vitamin E 400 UNIT capsule Take 400 Units by mouth daily.     No current facility-administered medications for this encounter.     REVIEW OF SYSTEMS: On review of systems, the patient reports that she is doing well since her breast surgery without breast specific complaints, but is currently nonambulatory due to her pelvic fractures. She is hoping to start feeling better soon and is motivated to gain her mobility back. Her pain is currently in the pelvis/hip region.  No other complaints are verbalized.      PHYSICAL EXAM:  Unable to assess due to encounter type.    ECOG = 3  0 - Asymptomatic  (Fully active, able to carry on all predisease activities without restriction)  1 - Symptomatic but completely ambulatory (Restricted in physically strenuous activity but ambulatory and able to carry out work of a light or sedentary nature. For example, light housework, office work)  2 - Symptomatic, <50% in bed during the day (Ambulatory and capable of all self care but unable to carry out any work activities. Up and about more than 50% of waking hours)  3 - Symptomatic, >50% in bed, but not bedbound (Capable of only limited self-care, confined to bed or chair 50% or more of waking hours)  4 - Bedbound (Completely disabled. Cannot carry on any self-care. Totally confined to bed or chair)  5 - Death   Santiago Glad MM, Creech RH, Tormey DC, et al. [phone]). "Toxicity and response criteria of the [facility]". Am. Evlyn Clines. Oncol. 5 (6): 649-55    LABORATORY DATA:  Lab Results  Component Value Date   WBC 7.0 12/31/2022   HGB 14.7 12/31/2022   HCT 43.0 12/31/2022   MCV 95.3 12/31/2022   PLT 178 12/31/2022   Lab Results  Component Value Date   NA 140 12/31/2022   K 3.6 12/31/2022   CL 101 12/31/2022   CO2 26 12/31/2022   Lab Results  Component Value Date   ALT 13 12/08/2022   AST 19 12/08/2022   ALKPHOS 67 12/08/2022   BILITOT 0.8 12/08/2022      RADIOGRAPHY: CT ABDOMEN PELVIS WO CONTRAST  Result Date: 02/18/2023 CLINICAL DATA:  Blunt abdominal trauma. EXAM: CT ABDOMEN AND PELVIS WITHOUT CONTRAST TECHNIQUE: Multidetector CT imaging of the abdomen and pelvis was performed following the standard protocol without IV contrast. RADIATION DOSE REDUCTION: This exam was performed according to the departmental dose-optimization program which includes automated exposure control, adjustment of the mA and/or kV according to patient size and/or use of iterative reconstruction technique. COMPARISON:  None Available.  FINDINGS: Evaluation of this exam is limited in the absence of  intravenous contrast. Lower chest: The visualized lung bases are clear. Cardiac pacemaker wires noted. No intra-abdominal free air or free fluid. Hepatobiliary: The liver is unremarkable. No biliary dilatation. Cholecystectomy. No retained calcified stone noted in the central CBD. Pancreas: The pancreas is unremarkable for patient's age. No active inflammatory changes. Spleen: Normal in size without focal abnormality. Adrenals/Urinary Tract: The adrenal glands are unremarkable. There is mild bilateral hydronephrosis. No stone identified. Several bilateral renal cysts measure up to 4.5 cm in the field of the right kidney. Additional subcentimeter hypodense lesions are too small to characterize. No imaging follow-up. The visualized ureters appear unremarkable. The urinary bladder is grossly unremarkable. Stomach/Bowel: Moderate stool throughout the colon. There is sigmoid diverticulosis without active inflammatory changes. There is no bowel obstruction or active inflammation. The appendix is normal. Vascular/Lymphatic: Moderate aortoiliac atherosclerotic disease. The IVC is unremarkable. No portal venous gas. There is no adenopathy. Reproductive: Hysterectomy.  No adnexal masses. Other: None Musculoskeletal: There is mild compression fracture of superior endplate of T12 with less than 10% loss of vertebral body height. No retropulsion. Additionally there is an age indeterminate, possibly acute, mild compression fracture of superior endplate of L2 with approximately 10% loss of vertebral body height. There is nondisplaced fractures of the right superior and inferior pubic rami. There is minimally displaced fracture of inferior right sacral al a extending to the right SI joint. The bones are osteopenic.  Grade 1 L4-L5 anterolisthesis. IMPRESSION: 1. Nondisplaced fractures of the right superior and inferior pubic rami. 2. Minimally displaced fracture of the inferior right sacral ala extending to the right SI joint. 3.  Mild compression fractures of superior endplates of T12 and L2. 4. Mild bilateral hydronephrosis. No stone identified. 5. Sigmoid diverticulosis. No bowel obstruction. Normal appendix. 6.  Aortic Atherosclerosis (ICD10-I70.0). Electronically Signed   By: Elgie Collard M.D.   On: 02/18/2023 20:51       IMPRESSION/PLAN: 1. High Grade, ER weakly positive, PR negative DCIS of the right breast. Dr. Mitzi Hansen has reviewed her final pathology findings and today we reviewed the nature of non invasive right breast disease. While she does have early stage disease overall, we reviewed she has high risk features for recurrence including grade, necrosis and calcifications, a close margin, and a weak ER receptor. We also discussed the results of the [facility] DCIS nomogram to determine estimated risks of recurrence with that calculator. After reviewing and considering her age, it would not be unreasonable to forgo radiation, however if she recovers well from her recent fall, and gets back to her baseline functional status, it does appear that there would be a rationale to consider external radiotherapy to the breast  to reduce risks of local recurrence followed by antiestrogen therapy. The patient is motivated to proceed with radiation after considering her risks of recurrence without radiation, granted her age, she wishes to minimize future risks of recurrence as she encountered with her mother's diagnosis. We reviewed options of either 4 weeks of radiotherapy to the right breast versus ultrahypofractionated radiotherapy in 5 fractions given once weekly over 5 weeks. She is interested in the ultrahypofractionated course given the need to have more accessible logistics to her treatment and appointments. The patient will be contacted to coordinate treatment planning by our simulation department and we will sign written consent to proceed at that time. 2. In Situ Pacemaker. She is not pacer dependent but we will  coordinate with  her cardiology team. 3. Recent pelvic fractures. The patient will continue her care within [facility] skilled department. We will coordinate her simulation of radiation around her recovery and plan to see her back in a few weeks. In the meantime she will work with the PT team at the facility.    This encounter was conducted via telephone.  The patient has provided two factor identification and has given verbal consent for this type of encounter and has been advised to only accept a meeting of this type in a secure network environment. The time spent during this encounter was 35 minutes including preparation, discussion, and coordination of the patient's care. The attendants for this meeting include  Grace Robinson  and Grace Robinson and her son Grace Robinson.  During the encounter,  Grace Robinson was located at [facility] Radiation Oncology Department.  TYRIKA NEWMAN was located at [employer] with her son Grace Robinson.      Osker Mason, [facility]    **Disclaimer: This note was dictated with voice recognition software. Similar sounding words can inadvertently be transcribed and this note may contain transcription errors which may not have been corrected upon publication of note.**

## 2023-03-01 NOTE — Progress Notes (Addendum)
Nursing interview via telephone for a 89 yr old female w/ Ductal carcinoma in situ (DCIS) of right breast. Patient identity verified x2.  Patient reports mild, sharp pains in RT breast on occasion 3/10. Also RT hip recovering well from a fall on 02/17/2023.  Meaningful use complete. Postmenopausal  There were no vitals taken for this visit.  Patient contact [phone]  This concludes the interview.   Ruel Favors, LPN

## 2023-03-01 NOTE — Addendum Note (Signed)
Encounter addended by: Ronny Bacon, PA-C on: 03/01/2023 12:49 PM  Actions taken: Level of Service modified

## 2023-03-01 NOTE — Progress Notes (Signed)
TO BE COMPLETED BY RADIATION ONCOLOGIST OFFICE:   Patient Name: Grace Robinson   Date of Birth: [birth year]   Radiation Oncologist:   Site to be Treated:   Will x-rays >10 MV be used? No  Will the radiation be >10 cm from the device? Yes  Planned Treatment Start Date:   TO BE COMPLETED BY CARDIOLOGIST OFFICE:   Device Information:  Pacemaker [x]      ICD []    Brand: Medtronic: [phone] Model #: Medtronic [id number] Percepta CRT-P  Serial Number: [id number]     Date of Placement: 08/31/2023  Site of Placement: Left Chest  Remote Device Check--Frequency: Every 91 days   Last Check: 02/28/2023  Is the Patient Pacer Dependent?:  Yes []   No [x]   Does cardiologist request Radiation Oncology to schedule device testing by vendor for the following:  Prior to the Initiation of Treatments?  Yes []  No [x]  During Treatments?  Yes []  No [x]  Post Radiation Treatments?  Yes []  No [x]   Is device monitoring necessary by vendor/cardiologist team during treatments?  Yes []   No [x]   Is cardiac monitoring by Radiation Oncology nursing necessary during treatments? Yes []   No [x]   Do you recommend device be relocated prior to Radiation Treatment? Yes []   No [x]   **PLEASE LIST ANY NOTES OR SPECIAL REQUESTS:       CARDIOLOGIST SIGNATURE:  Dr. Sherryl Manges Per Device Clinic Standing Orders, Lenor Coffin  03/01/2023 1:30 PM  **Please route completed form back to Radiation Oncology Nursing and "P [facility] RAD ONC ADMIN", OR send an update if there will be a delay in having form completed by expected start date.  **Call [phone] if you have any questions or do not get an in-basket response from a Radiation Oncology staff member

## 2023-03-02 ENCOUNTER — Telehealth: Payer: Self-pay

## 2023-03-02 ENCOUNTER — Encounter: Payer: Self-pay | Admitting: Nurse Practitioner

## 2023-03-02 ENCOUNTER — Ambulatory Visit (INDEPENDENT_AMBULATORY_CARE_PROVIDER_SITE_OTHER): Payer: Medicare Other

## 2023-03-02 ENCOUNTER — Non-Acute Institutional Stay (INDEPENDENT_AMBULATORY_CARE_PROVIDER_SITE_OTHER): Payer: Medicare Other | Admitting: Nurse Practitioner

## 2023-03-02 DIAGNOSIS — Z9889 Other specified postprocedural states: Secondary | ICD-10-CM | POA: Diagnosis not present

## 2023-03-02 DIAGNOSIS — Z0000 Encounter for general adult medical examination without abnormal findings: Secondary | ICD-10-CM

## 2023-03-02 DIAGNOSIS — K588 Other irritable bowel syndrome: Secondary | ICD-10-CM | POA: Diagnosis not present

## 2023-03-02 DIAGNOSIS — S3210XD Unspecified fracture of sacrum, subsequent encounter for fracture with routine healing: Secondary | ICD-10-CM | POA: Diagnosis not present

## 2023-03-02 DIAGNOSIS — I10 Essential (primary) hypertension: Secondary | ICD-10-CM | POA: Diagnosis not present

## 2023-03-02 DIAGNOSIS — R296 Repeated falls: Secondary | ICD-10-CM | POA: Diagnosis not present

## 2023-03-02 DIAGNOSIS — Z950 Presence of cardiac pacemaker: Secondary | ICD-10-CM | POA: Diagnosis not present

## 2023-03-02 DIAGNOSIS — E7800 Pure hypercholesterolemia, unspecified: Secondary | ICD-10-CM | POA: Diagnosis not present

## 2023-03-02 DIAGNOSIS — N183 Chronic kidney disease, stage 3 unspecified: Secondary | ICD-10-CM | POA: Diagnosis not present

## 2023-03-02 DIAGNOSIS — S22089A Unspecified fracture of T11-T12 vertebra, initial encounter for closed fracture: Secondary | ICD-10-CM | POA: Diagnosis not present

## 2023-03-02 DIAGNOSIS — E11649 Type 2 diabetes mellitus with hypoglycemia without coma: Secondary | ICD-10-CM | POA: Diagnosis not present

## 2023-03-02 DIAGNOSIS — I2510 Atherosclerotic heart disease of native coronary artery without angina pectoris: Secondary | ICD-10-CM | POA: Diagnosis not present

## 2023-03-02 DIAGNOSIS — M8580 Other specified disorders of bone density and structure, unspecified site: Secondary | ICD-10-CM | POA: Diagnosis not present

## 2023-03-02 DIAGNOSIS — Z170 Estrogen receptor positive status [ER+]: Secondary | ICD-10-CM | POA: Diagnosis not present

## 2023-03-02 DIAGNOSIS — I428 Other cardiomyopathies: Secondary | ICD-10-CM | POA: Diagnosis not present

## 2023-03-02 DIAGNOSIS — D649 Anemia, unspecified: Secondary | ICD-10-CM | POA: Diagnosis not present

## 2023-03-02 DIAGNOSIS — Z510 Encounter for antineoplastic radiation therapy: Secondary | ICD-10-CM | POA: Diagnosis present

## 2023-03-02 DIAGNOSIS — E1169 Type 2 diabetes mellitus with other specified complication: Secondary | ICD-10-CM | POA: Diagnosis not present

## 2023-03-02 DIAGNOSIS — S22009D Unspecified fracture of unspecified thoracic vertebra, subsequent encounter for fracture with routine healing: Secondary | ICD-10-CM | POA: Diagnosis not present

## 2023-03-02 DIAGNOSIS — E1122 Type 2 diabetes mellitus with diabetic chronic kidney disease: Secondary | ICD-10-CM | POA: Diagnosis not present

## 2023-03-02 DIAGNOSIS — K5732 Diverticulitis of large intestine without perforation or abscess without bleeding: Secondary | ICD-10-CM | POA: Diagnosis not present

## 2023-03-02 DIAGNOSIS — E119 Type 2 diabetes mellitus without complications: Secondary | ICD-10-CM | POA: Diagnosis not present

## 2023-03-02 DIAGNOSIS — Z8673 Personal history of transient ischemic attack (TIA), and cerebral infarction without residual deficits: Secondary | ICD-10-CM | POA: Diagnosis not present

## 2023-03-02 DIAGNOSIS — I5022 Chronic systolic (congestive) heart failure: Secondary | ICD-10-CM | POA: Diagnosis not present

## 2023-03-02 DIAGNOSIS — D0511 Intraductal carcinoma in situ of right breast: Secondary | ICD-10-CM | POA: Diagnosis present

## 2023-03-02 DIAGNOSIS — S3282XD Multiple fractures of pelvis without disruption of pelvic ring, subsequent encounter for fracture with routine healing: Secondary | ICD-10-CM | POA: Diagnosis not present

## 2023-03-02 LAB — CUP PACEART REMOTE DEVICE CHECK
Battery Remaining Longevity: 137 mo
Battery Voltage: 3.15 V
Brady Statistic AP VP Percent: 28.82 %
Brady Statistic AP VS Percent: 0.49 %
Brady Statistic AS VP Percent: 69.46 %
Brady Statistic AS VS Percent: 1.22 %
Brady Statistic RA Percent Paced: 29.31 %
Brady Statistic RV Percent Paced: 0.93 %
Date Time Interrogation Session: 20240501033416
Implantable Lead Connection Status: 753985
Implantable Lead Connection Status: 753985
Implantable Lead Connection Status: 753985
Implantable Lead Connection Status: 753985
Implantable Lead Implant Date: 20031007
Implantable Lead Implant Date: 20031007
Implantable Lead Implant Date: 20031007
Implantable Lead Implant Date: 20060227
Implantable Lead Location: 753858
Implantable Lead Location: 753859
Implantable Lead Location: 753860
Implantable Lead Location: 753860
Implantable Lead Model: 4193
Implantable Lead Model: 4469
Implantable Lead Model: 4470
Implantable Lead Model: 6949
Implantable Lead Serial Number: 337988
Implantable Lead Serial Number: 361236
Implantable Pulse Generator Implant Date: 20231030
Lead Channel Impedance Value: 3363 Ohm
Lead Channel Impedance Value: 3363 Ohm
Lead Channel Impedance Value: 3363 Ohm
Lead Channel Impedance Value: 342 Ohm
Lead Channel Impedance Value: 342 Ohm
Lead Channel Impedance Value: 380 Ohm
Lead Channel Impedance Value: 456 Ohm
Lead Channel Impedance Value: 513 Ohm
Lead Channel Impedance Value: 608 Ohm
Lead Channel Pacing Threshold Amplitude: 0.75 V
Lead Channel Pacing Threshold Amplitude: 1.125 V
Lead Channel Pacing Threshold Pulse Width: 0.4 ms
Lead Channel Pacing Threshold Pulse Width: 0.4 ms
Lead Channel Sensing Intrinsic Amplitude: 1.25 mV
Lead Channel Sensing Intrinsic Amplitude: 1.25 mV
Lead Channel Sensing Intrinsic Amplitude: 31.625 mV
Lead Channel Sensing Intrinsic Amplitude: 31.625 mV
Lead Channel Setting Pacing Amplitude: 1.5 V
Lead Channel Setting Pacing Amplitude: 1.5 V
Lead Channel Setting Pacing Amplitude: 2.25 V
Lead Channel Setting Pacing Pulse Width: 0.4 ms
Lead Channel Setting Pacing Pulse Width: 0.8 ms
Lead Channel Setting Sensing Sensitivity: 1.2 mV
Zone Setting Status: 755011

## 2023-03-02 NOTE — Telephone Encounter (Signed)
Remote ICM transmission received.  Attempted call to patient regarding ICM remote transmission and no answer or voice mail option.  

## 2023-03-02 NOTE — Progress Notes (Signed)
Subjective:   Grace Robinson is a 89 y.o. female who presents for Medicare Annual (Subsequent) preventive examination in [facility].  Cardiac Risk Factors include: advanced age (>43men, >79 women);dyslipidemia;hypertension     Objective:    Today's Vitals   03/02/23 1325 03/02/23 1327  BP: (!) 163/72   Pulse: 62   Resp: 16   Temp: (!) 97.2 F (36.2 C)   SpO2: 97%   Weight: 124 lb (56.2 kg)   PainSc:  5    Body mass index is 20.01 kg/m.     03/01/2023    9:05 AM 02/18/2023    7:27 PM 12/31/2022    1:39 PM 08/30/2022    8:23 AM 08/16/2022   11:21 AM 03/02/2022    1:19 PM 08/05/2020   12:46 PM  Advanced Directives  Does Patient Have a Medical Advance Directive? Yes No Yes Yes Yes Yes Yes  Type of Advance Directive Living will;Healthcare Power of [employer] Power of [place];Living will Living will   Healthcare Power of [place];Living will  Does patient want to make changes to medical advance directive?    No - Patient declined     Copy of Healthcare Power of Attorney in Chart?   No - copy requested      Would patient like information on creating a medical advance directive?  No - Patient declined         Current Medications (verified) Outpatient Encounter Medications as of 03/02/2023  Medication Sig   acetaminophen (TYLENOL) 500 MG tablet Take 500 mg by mouth every 8 (eight) hours as needed for headache.   aspirin EC 81 MG tablet Take 81 mg by mouth daily. Swallow whole.   atorvastatin (LIPITOR) 40 MG tablet Take 40 mg by mouth daily.   Cholecalciferol (VITAMIN D-3) 25 MCG (1000 UT) CAPS Take 2,000 Units by mouth daily.   cholestyramine (QUESTRAN) 4 g packet Take 1 packet (4 g total) by mouth daily. (Patient taking differently: Take 4 g by mouth daily as needed (diarrhea).)   CRANBERRY CONCENTRATE PO Take 2 tablets by mouth daily.   dicyclomine (BENTYL) 10 MG capsule TAKE 1 CAPSULE BY MOUTH THREE TIMES DAILY 30 MINUTES BEFORE MEALS   furosemide  (LASIX) 20 MG tablet Take 1 tablet (20 mg total) by mouth daily. alternating (40 mg) every other day.   insulin aspart protamine - aspart (NOVOLOG 70/30 MIX) (70-30) 100 UNIT/ML FlexPen Inject 32 Units into the skin 2 (two) times daily with a meal. May Inject 15 units if blood sugar is greater than 200   Lancets (ONETOUCH DELICA PLUS LANCET30G) MISC CHECK BLOOD SUGAR TWICE DAILY   loperamide (IMODIUM A-D) 2 MG tablet Take 2 mg by mouth as needed for diarrhea or loose stools.   metoprolol succinate (TOPROL-XL) 100 MG 24 hr tablet Take 100 mg by mouth 2 (two) times daily.   olmesartan (BENICAR) 40 MG tablet Take 40 mg by mouth daily.   ONETOUCH VERIO test strip 2 (two) times daily.   oxyCODONE (ROXICODONE) 5 MG immediate release tablet Take 0.5-1 tablets (2.5-5 mg total) by mouth every 4 (four) hours as needed for severe pain.   potassium chloride (K-DUR) 10 MEQ tablet Take 10 mEq by mouth 2 (two) times daily.   traMADol (ULTRAM) 50 MG tablet Take 1 tablet (50 mg total) by mouth every 6 (six) hours as needed.   vitamin B-12 (CYANOCOBALAMIN) 1000 MCG tablet Take 1,000 mcg by mouth once a week.   vitamin  E 400 UNIT capsule Take 400 Units by mouth daily.   No facility-administered encounter medications on file as of 03/02/2023.    Allergies (verified) Carvedilol, Digoxin, Vancomycin, and Pseudoephedrine   History: Past Medical History:  Diagnosis Date   Biventricular ICD (implantable cardiac defibrillator) in place    downgraded from CRT-D to CRT-P 08/30/22   Breast cancer (HCC)    Cellulitis    Change in bowel habits    CHF (congestive heart failure) (HCC)    Colon polyps    Diabetes mellitus    Type II   Diarrhea    Dyslipidemia    HOH (hard of hearing)    left   HTN (hypertension)    LBBB (left bundle branch block)    chronic   Meniere's disease    Nonischemic cardiomyopathy (HCC) 2006   Presence of permanent cardiac pacemaker    T wave oversensing 01/17/2014   Ulcerative  colitis    Varicose veins    4 lazer  treatment each leg   Past Surgical History:  Procedure Laterality Date   ABDOMINAL HYSTERECTOMY     BIV PACEMAKER GENERATOR CHANGEOUT N/A 08/30/2022   Procedure: BIV PACEMAKER GENERATOR CHANGEOUT;  Surgeon: Duke Salvia, MD;  Location: [facility] INVASIVE CV LAB;  Service: Cardiovascular;  Laterality: N/A;   BREAST LUMPECTOMY WITH RADIOACTIVE SEED LOCALIZATION Right 01/05/2023   Procedure: RIGHT BREAST BRACKETED LUMPECTOMY WITH RADIOACTIVE SEED LOCALIZATION;  Surgeon: Abigail Miyamoto, MD;  Location: [facility] OR;  Service: General;  Laterality: Right;   CARDIAC CATHETERIZATION  08/2011   CHOLECYSTECTOMY     COLONOSCOPY     EP Study  08/22/08   IMPLANTABLE CARDIOVERTER DEFIBRILLATOR GENERATOR CHANGE N/A 08/22/2013   Procedure: IMPLANTABLE CARDIOVERTER DEFIBRILLATOR GENERATOR CHANGE;  Surgeon: Duke Salvia, MD;  Location[facility] C[facility] LAB;  Service: Cardiovascular;  Laterality: N/A;   KNEE ARTHROSCOPY Left 04/14/2016   Procedure: LEFT KNEE ARTHROSCOPY, PARTIAL MEDIAL AND PARTIAL LATERAL MENISCECTOMY, MEDIAL PLICA;  Surgeon: Jodi Geralds, MD;  Location: [facility] OR;  Service: Orthopedics;  Laterality: Left;   mastoid sugery     in the setting of Meniere's disease   TONSILLECTOMY AND ADENOIDECTOMY     Family History  Problem Relation Age of Onset   Breast cancer Mother 48       met to lungs   Emphysema Father 42   Heart disease Father    Healthy Child    Lung cancer Cousin        maternal first cousin, she smoked   Colon cancer Neg Hx    Rectal cancer Neg Hx    Stomach cancer Neg Hx    Liver cancer Neg Hx    Esophageal cancer Neg Hx    Social History   Socioeconomic History   Marital status: Single    Spouse name: Not on file   Number of children: 2   Years of education: Not on file   Highest education level: Bachelor's degree (e.g., BA, AB, BS)  Occupational History   Occupation: retired    Comment: Runner, 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    Social Determinants of Health   Financial Resource Strain: Low Risk  (12/08/2022)   Overall Financial Resource Strain (CARDIA)    Difficulty of Paying Living Expenses: Not hard at all  Food Insecurity: No Food Insecurity (12/08/2022)   Hunger Vital Sign    Worried About Running Out of Food in the Last Year: Never true    Ran Out of Food in the Last Year: Never true  Transportation Needs: No Transportation Needs (12/08/2022)   PRAPARE - Administrator, Civil Service (Medical): No    Lack of Transportation (Non-Medical): No  Physical Activity: Not on file  Stress: Not on file  Social Connections: Not on file    Tobacco Counseling Counseling given: Not Answered Tobacco comments: quit in 1970   Clinical Intake:  Pre-visit preparation completed: Yes  Pain : 0-10 Pain Score: 5  Pain Type: Acute pain Pain Location: Pelvis Pain Orientation: Right Pain Descriptors / Indicators: Discomfort, Pressure Pain Onset: 1 to 4 weeks ago Pain Frequency: Several days a week Pain Relieving Factors: non weight bearing, pain meds. Effect of Pain on Daily Activities: less ambulation.  Pain Relieving Factors: non weight bearing, pain meds.  BMI - recorded: 10.01 Nutritional Status: BMI of 19-24  Normal Nutritional Risks: None Diabetes: No  How often do you need to have someone help you when you read instructions, pamphlets, or other written materials from your doctor or pharmacy?: 2 - Rarely What is the last grade level you completed in school?: high  school  Diabetic?no  Interpreter Needed?: No  Information entered by :: Freddy Spadafora Nedra Hai NP   Activities of Daily Living    03/02/2023    1:34 PM 12/31/2022    1:43 PM  In your present state of health, do you have any difficulty performing the following activities:  Hearing? 0   Vision? 0   Difficulty concentrating or making decisions? 0   Walking or climbing stairs? 1   Dressing or bathing? 1   Doing errands, shopping? 1 0  Preparing Food and eating ? N   Using the Toilet? N   In the past six months, have you accidently leaked urine? Y   Do you have problems with loss of bowel control? N   Managing your Medications? Y   Managing your Finances? Y   Housekeeping or managing your Housekeeping? Y     Patient Care Team: Creola Corn, MD as PCP - General (Internal Medicine) Abigail Miyamoto, MD as Consulting Physician (General Surgery) Rachel Moulds, MD as Consulting Physician (Hematology and Oncology) Dorothy Puffer, MD as Consulting Physician (Radiation Oncology) Pershing Proud, RN as Oncology Nurse Navigator Donnelly Angelica, RN as Oncology Nurse Navigator  Indicate any recent Medical Services you may have received from other than [facility] providers in the past year (date may be approximate).     Assessment:   This is a routine wellness examination for [facility].  Hearing/Vision screen No results found.  Dietary issues and exercise activities discussed: Current Exercise Habits: Structured exercise class (working wiht therapy), Type of exercise: strength training/weights;stretching;walking, Time (Minutes): 30, Frequency (Times/Week): 5, Weekly Exercise (Minutes/Week): 150, Intensity: Mild   Goals Addressed             This Visit's Progress    Maintain Mobility and Function       Evidence-based guidance:  Emphasize the importance  of physical activity and aerobic exercise as included in treatment plan; assess barriers to adherence; consider patient's abilities and preferences.   Encourage gradual increase in activity or exercise instead of stopping if pain occurs.  Reinforce individual therapy exercise prescription, such as strengthening, stabilization and stretching programs.  Promote optimal body mechanics to stabilize the spine with lifting and functional activity.  Encourage activity and mobility modifications to facilitate optimal function, such as using a log roll for bed mobility or dressing from a seated position.  Reinforce individual adaptive equipment recommendations to limit excessive spinal movements, such as a Event organiser.  Assess adequacy of sleep; encourage use of sleep hygiene techniques, such as bedtime routine; use of white noise; dark, cool bedroom; avoiding daytime naps, heavy meals or exercise before bedtime.  Promote positions and modification to optimize sleep and sexual activity; consider pillows or positioning devices to assist in maintaining neutral spine.  Explore options for applying ergonomic principles at work and home, such as frequent position changes, using ergonomically designed equipment and working at optimal height.  Promote modifications to increase comfort with driving such as lumbar support, optimizing seat and steering wheel position, using cruise control and taking frequent rest stops to stretch and walk.   Notes:        Depression Screen     No data to display          Fall Risk    08/16/2022   11:20 AM 03/02/2022    1:19 PM 08/05/2020   12:46 PM 08/06/2019    1:50 PM 08/30/2018   10:57 AM  Fall Risk   Falls in the past year? 0 1 0 0 No  Number falls in past yr:  1 0 0   Injury with Fall?  1 0 0   Follow up Falls evaluation completed        FALL RISK PREVENTION PERTAINING TO THE HOME:  Any stairs in or around the home? Yes  If so, are there any without handrails? No  Home free of loose throw rugs in walkways, pet beds, electrical cords, etc? Yes  Adequate lighting in your home to reduce risk of  falls? Yes   ASSISTIVE DEVICES UTILIZED TO PREVENT FALLS:  Life alert? No  Use of a cane, walker or w/c? Yes  Grab bars in the bathroom? Yes  Shower chair or bench in shower? Yes  Elevated toilet seat or a handicapped toilet? Yes   TIMED UP AND GO:  Was the test performed? Yes .  Length of time to ambulate 10 feet: 15 sec.   Gait slow and steady with assistive device  Cognitive Function:        Immunizations Immunization History  Administered Date(s) Administered   Influenza, High Dose Seasonal PF 10/06/2016, 10/05/2017, 09/14/2018   Influenza,inj,Quad PF,6+ Mos 10/02/2014   Tdap 09/29/2022    TDAP status: Up to date  Flu Vaccine status: Up to date  Pneumococcal vaccine status: Due, Education has been provided regarding the importance of this vaccine. Advised may receive this vaccine at local pharmacy or Health Dept. Aware to provide a copy of the vaccination record if obtained from local pharmacy or Health Dept. Verbalized acceptance and understanding.  Covid-19 vaccine status: Information provided on how to obtain vaccines.   Qualifies for Shingles Vaccine? Yes   Zostavax completed Yes   Shingrix Completed?: No.    Education has been provided regarding the importance of this vaccine. Patient has been advised to call insurance company to determine  out of pocket expense if they have not yet received this vaccine. Advised may also receive vaccine at local pharmacy or Health Dept. Verbalized acceptance and understanding.  Screening Tests Health Maintenance  Topic Date Due   COVID-19 Vaccine (1) Never done   FOOT EXAM  Never done   OPHTHALMOLOGY EXAM  Never done   Zoster Vaccines- Shingrix (1 of 2) Never done   Pneumonia Vaccine 65+ Years old (1 of 1 - PCV) Never done   DEXA SCAN  Never done   INFLUENZA VACCINE  06/02/2023   HEMOGLOBIN A1C  07/03/2023   Medicare Annual Wellness (AWV)  03/01/2024   DTaP/Tdap/Td (2 - Td or Tdap) 09/29/2032   HPV VACCINES  Aged Out     Health Maintenance  Health Maintenance Due  Topic Date Due   COVID-19 Vaccine (1) Never done   FOOT EXAM  Never done   OPHTHALMOLOGY EXAM  Never done   Zoster Vaccines- Shingrix (1 of 2) Never done   Pneumonia Vaccine 78+ Years old (1 of 1 - PCV) Never done   DEXA SCAN  Never done    Colorectal cancer screening: No longer required.   Mammogram status: No longer required due to aged out.  Bone Density status: Ordered 03/02/23. Pt provided with contact info and advised to call to schedule appt.  Lung Cancer Screening: (Low Dose CT Chest recommended if Age 45-80 years, 30 pack-year currently smoking OR have quit w/in 15years.) does not qualify.     Additional Screening:  Hepatitis C Screening: does not qualify;   Vision Screening: Recommended annual ophthalmology exams for early detection of glaucoma and other disorders of the eye. Is the patient up to date with their annual eye exam?  No  Who is the provider or what is the name of the office in which the patient attends annual eye exams? [facility] will provide If pt is not established with a provider, would they like to be referred to a provider to establish care? No .   Dental Screening: Recommended annual dental exams for proper oral hygiene  Community Resource Referral / Chronic Care Management: CRR required this visit?  No   CCM required this visit?  No      Plan:     I have personally reviewed and noted the following in the patient's chart:   Medical and social history Use of alcohol, tobacco or illicit drugs  Current medications and supplements including opioid prescriptions. Patient is currently taking opioid prescriptions. Information provided to patient regarding non-opioid alternatives. Patient advised to discuss non-opioid treatment plan with their provider. Functional ability and status Nutritional status Physical activity Advanced directives List of other physicians Hospitalizations, surgeries, and ER  visits in previous 12 months Vitals Screenings to include cognitive, depression, and falls Referrals and appointments  In addition, I have reviewed and discussed with patient certain preventive protocols, quality metrics, and best practice recommendations. A written personalized care plan for preventive services as well as general preventive health recommendations were provided to patient.     Cienna Dumais X Kasean Denherder, NP   03/04/2023

## 2023-03-02 NOTE — Progress Notes (Signed)
EPIC Encounter for ICM Monitoring  Patient Name: Grace Robinson is a 89 y.o. female Date: 03/02/2023 Primary Care Physican: Creola Corn, MD Primary Cardiologist: Graciela Husbands Electrophysiologist: Joycelyn Schmid Pacing:  98.3%     12/24/2022 Weight:  130 lbs   Since 28-Feb-2023 Time in AT/AF  0.0 hr/day (0.0%)           Attempted call to patient and unable to reach.   Transmission reviewed. ED visit 4/19 for fall which resulted in fracture vertebrae and pelvis.   Optivol Thoracic impedance suggesting possible fluid accumulation starting 4/25 but trending back close to baseline.       Prescribed:  Furosemide 20 mg take 1 tablet (20 mg total) by mouth daily alternating (40 mg) every other day.   Potassium 10 mEq 1 tablet twice a day.   Labs: 12/31/2022 Creatinine 0.94, BUN 13, Potassium 3.6, Sodium 140, GFR 56 12/08/2022 Creatinine 1.02, BUN 15, Potassium 3.3, Sodium 140 08/02/2022 Creatinine 0.89, BUN 15, Potassium 3.8, Sodium 144, GFR 60 A complete set of results can be found in Results Review.   Recommendations:  Unable to reach.     Follow-up plan: ICM clinic phone appointment on 03/08/2023 to recheck fluid levels.   91 day device clinic remote transmission 06/01/2023.      EP/Cardiology Office Visits:  Recall 09/03/2023 with Dr Graciela Husbands.     Copy of ICM check sent to Dr. Graciela Husbands.    3 month ICM trend: 02/28/2023.    12-14 Month ICM trend:     Karie Soda, RN 03/02/2023 10:55 AM

## 2023-03-04 ENCOUNTER — Encounter: Payer: Self-pay | Admitting: Nurse Practitioner

## 2023-03-04 DIAGNOSIS — M8580 Other specified disorders of bone density and structure, unspecified site: Secondary | ICD-10-CM | POA: Insufficient documentation

## 2023-03-07 ENCOUNTER — Encounter: Payer: Self-pay | Admitting: Nurse Practitioner

## 2023-03-07 ENCOUNTER — Non-Acute Institutional Stay (SKILLED_NURSING_FACILITY): Payer: Medicare Other | Admitting: Nurse Practitioner

## 2023-03-07 DIAGNOSIS — I5022 Chronic systolic (congestive) heart failure: Secondary | ICD-10-CM

## 2023-03-07 DIAGNOSIS — E1169 Type 2 diabetes mellitus with other specified complication: Secondary | ICD-10-CM

## 2023-03-07 DIAGNOSIS — S3282XD Multiple fractures of pelvis without disruption of pelvic ring, subsequent encounter for fracture with routine healing: Secondary | ICD-10-CM | POA: Diagnosis not present

## 2023-03-07 DIAGNOSIS — E538 Deficiency of other specified B group vitamins: Secondary | ICD-10-CM

## 2023-03-07 DIAGNOSIS — I10 Essential (primary) hypertension: Secondary | ICD-10-CM

## 2023-03-07 DIAGNOSIS — N183 Chronic kidney disease, stage 3 unspecified: Secondary | ICD-10-CM

## 2023-03-07 DIAGNOSIS — E785 Hyperlipidemia, unspecified: Secondary | ICD-10-CM

## 2023-03-07 DIAGNOSIS — E559 Vitamin D deficiency, unspecified: Secondary | ICD-10-CM

## 2023-03-07 DIAGNOSIS — Z780 Asymptomatic menopausal state: Secondary | ICD-10-CM

## 2023-03-07 DIAGNOSIS — Z794 Long term (current) use of insulin: Secondary | ICD-10-CM

## 2023-03-07 DIAGNOSIS — K5732 Diverticulitis of large intestine without perforation or abscess without bleeding: Secondary | ICD-10-CM | POA: Diagnosis not present

## 2023-03-07 DIAGNOSIS — E1122 Type 2 diabetes mellitus with diabetic chronic kidney disease: Secondary | ICD-10-CM

## 2023-03-07 DIAGNOSIS — M8580 Other specified disorders of bone density and structure, unspecified site: Secondary | ICD-10-CM

## 2023-03-07 NOTE — Assessment & Plan Note (Signed)
taking Furosemide, Bun/creat 17/0.8 02/24/23

## 2023-03-07 NOTE — Assessment & Plan Note (Signed)
on Vit D, Vit D 41 02/24/23

## 2023-03-07 NOTE — Assessment & Plan Note (Signed)
taking Bentyl, Imodium

## 2023-03-07 NOTE — Progress Notes (Signed)
Location:  [facility] Room Number: NO/22/A Place of Service:  SNF (31) Provider:  Jalisia Puchalski X, NP  Patient Care Team: Creola Corn, MD as PCP - General (Internal Medicine) Abigail Miyamoto, MD as Consulting Physician (General Surgery) Rachel Moulds, MD as Consulting Physician (Hematology and Oncology) Dorothy Puffer, MD as Consulting Physician (Radiation Oncology) Pershing Proud, RN as Oncology Nurse Navigator Donnelly Angelica, RN as Oncology Nurse Navigator  Extended Emergency Contact Information Primary Emergency Contact: [name] Address: [address]          [place], [place] [address] [place] of [place] Home Phone: [phone] Mobile Phone: [phone] Relation: Son  Code Status:  DNR Goals of care: Advanced Directive information    03/07/2023    9:31 AM  Advanced Directives  Does Patient Have a Medical Advance Directive? Yes  Type of Advance Directive Living will;Healthcare Power of Attorney  Does patient want to make changes to medical advance directive? No - Patient declined  Copy of Healthcare Power of Attorney in Chart? No - copy requested  Would patient like information on creating a medical advance directive? No - Patient declined     Chief Complaint  Patient presents with   Medical Management of Chronic Issues    Patient is here for a follow up for chronic conditions     HPI:  Pt is a 89 y.o. female seen today for medical management of chronic diseases.    T2DM/renal complication, taking insulin 70/30, 03/04/23 hypoglycemic episode(CBG 47) in pm, stabilized after Glucagon 1mg  IM, reduced Aspart 70/30 to 28u/32u, continued am 32 u, prn noon, added qhs CBG check, CBG range 110-210. Hgb A1c 7.5 02/24/23 The patient was admitted to [facility] for therapy following multiple fractures sustained from fall 02/18/23. She was found to have pelvic fractures: nondisplaced fxs of R superior and inferior pubic rami, minimally displaced  fx of the inferior R sacral ala extended to the R SIJ. Mild compression fx of superior endplates of T12, L2. Prn Tylenol, Tramadol, Oxycodone available for pain.  Fall, mechanical,  Mild bilateral hydronephrosis, no stones Sigmoid diverticulitis/IBS taking Bentyl, Imodium Aortic atherosclerosis, taking ASA, Atorvastatin HLD, taking Atorvastatin, LDL 58 02/24/23 Vit B12 deficiency, takes Vit B12, Hgb 12.9 02/24/23 Vit D deficiency, on Vit D, Vit D 41 02/24/23 CHF, taking Furosemide, Bun/creat 17/0.8 02/24/23 HTN, taking Metoprolol, Olmesartan OP 03/03/23 DEXA t score -1.236, adding Ca, continue Vit D, recommend Fosamax 70mg  qwk vs Evenity 210mg  [place] qmo x12 mos.              S/p R breat cancer lumpectomy, f/u surgeon Past Medical History:  Diagnosis Date   Biventricular ICD (implantable cardiac defibrillator) in place    downgraded from CRT-D to CRT-P 08/30/22   Breast cancer (HCC)    Cellulitis    Change in bowel habits    CHF (congestive heart failure) (HCC)    Colon polyps    Diabetes mellitus    Type II   Diarrhea    Dyslipidemia    HOH (hard of hearing)    left   HTN (hypertension)    LBBB (left bundle branch block)    chronic   Meniere's disease    Nonischemic cardiomyopathy (HCC) 2006   Presence of permanent cardiac pacemaker    T wave oversensing 01/17/2014   Ulcerative colitis    Varicose veins    4 lazer  treatment each leg   Past Surgical History:  Procedure Laterality Date  ABDOMINAL HYSTERECTOMY     BIV PACEMAKER GENERATOR CHANGEOUT N/A 08/30/2022   Procedure: BIV PACEMAKER GENERATOR CHANGEOUT;  Surgeon: Duke Salvia, MD;  Location: [facility] INVASIVE CV LAB;  Service: Cardiovascular;  Laterality: N/A;   BREAST LUMPECTOMY WITH RADIOACTIVE SEED LOCALIZATION Right 01/05/2023   Procedure: RIGHT BREAST BRACKETED LUMPECTOMY WITH RADIOACTIVE SEED LOCALIZATION;  Surgeon: Abigail Miyamoto, MD;  Location: [facility] OR;  Service: General;  Laterality: Right;   CARDIAC CATHETERIZATION   08/2011   CHOLECYSTECTOMY     COLONOSCOPY     EP Study  08/22/08   IMPLANTABLE CARDIOVERTER DEFIBRILLATOR GENERATOR CHANGE N/A 08/22/2013   Procedure: IMPLANTABLE CARDIOVERTER DEFIBRILLATOR GENERATOR CHANGE;  Surgeon: Duke Salvia, MD;  Location: [facility] CATH LAB;  Service: Cardiovascular;  Laterality: N/A;   KNEE ARTHROSCOPY Left 04/14/2016   Procedure: LEFT KNEE ARTHROSCOPY, PARTIAL MEDIAL AND PARTIAL LATERAL MENISCECTOMY, MEDIAL PLICA;  Surgeon: Jodi Geralds, MD;  Location: [facility] OR;  Service: Orthopedics;  Laterality: Left;   mastoid sugery     in the setting of Meniere's disease   TONSILLECTOMY AND ADENOIDECTOMY      Allergies  Allergen Reactions   Carvedilol Other (See Comments)    Pt does not remember reaction  Other Reaction(s): Unknown   Digoxin Other (See Comments)    Dig toxicity   Vancomycin Other (See Comments)    ("Red Dereona Kolodny Syndrome") Pt states she turned red and was itching  Unknown  ("Red Maclin Guerrette Syndrome") Pt states she turned red and was itching    Unknown    ("Red Demaurion Dicioccio Syndrome") Pt states she turned red and was itching   Pseudoephedrine Other (See Comments)    Makes skin feel like its crawling  Makes skin feel like its crawling Unknown Makes skin feel like its crawling  Makes skin feel like its crawling Unknown  Makes skin feel like its crawling    Makes skin feel like its crawling Unknown Makes skin feel like its crawling    Makes skin feel like its crawling, Unknown    Makes skin feel like its crawling Unknown    Makes skin feel like its crawling    Outpatient Encounter Medications as of 03/07/2023  Medication Sig   acetaminophen (TYLENOL) 500 MG tablet Take 500 mg by mouth every 8 (eight) hours as needed for headache.   aspirin EC 81 MG tablet Take 81 mg by mouth daily. Swallow whole.   atorvastatin (LIPITOR) 40 MG tablet Take 40 mg by mouth daily.   Cholecalciferol (VITAMIN D-3) 25 MCG (1000 UT) CAPS Take 2,000 Units by mouth daily.   cholestyramine (QUESTRAN)  4 g packet Take 1 packet (4 g total) by mouth daily. (Patient taking differently: Take 4 g by mouth daily as needed (diarrhea).)   CRANBERRY CONCENTRATE PO Take 2 tablets by mouth daily.   dicyclomine (BENTYL) 10 MG capsule TAKE 1 CAPSULE BY MOUTH THREE TIMES DAILY 30 MINUTES BEFORE MEALS   furosemide (LASIX) 20 MG tablet Take 1 tablet (20 mg total) by mouth daily. alternating (40 mg) every other day.   insulin aspart protamine - aspart (NOVOLOG 70/30 MIX) (70-30) 100 UNIT/ML FlexPen Inject 32 Units into the skin 2 (two) times daily with a meal. May Inject 15 units if blood sugar is greater than 200   Lancets (ONETOUCH DELICA PLUS LANCET30G) MISC CHECK BLOOD SUGAR TWICE DAILY   loperamide (IMODIUM A-D) 2 MG tablet Take 2 mg by mouth as needed for diarrhea or loose stools.   metoprolol succinate (TOPROL-XL) 100 MG 24 hr  tablet Take 100 mg by mouth 2 (two) times daily.   olmesartan (BENICAR) 40 MG tablet Take 40 mg by mouth daily.   ONETOUCH VERIO test strip 2 (two) times daily.   oxyCODONE (ROXICODONE) 5 MG immediate release tablet Take 0.5-1 tablets (2.5-5 mg total) by mouth every 4 (four) hours as needed for severe pain.   potassium chloride (K-DUR) 10 MEQ tablet Take 10 mEq by mouth 2 (two) times daily.   traMADol (ULTRAM) 50 MG tablet Take 1 tablet (50 mg total) by mouth every 6 (six) hours as needed.   vitamin B-12 (CYANOCOBALAMIN) 1000 MCG tablet Take 1,000 mcg by mouth once a week.   vitamin E 400 UNIT capsule Take 400 Units by mouth daily.   No facility-administered encounter medications on file as of 03/07/2023.    Review of Systems  Constitutional:  Negative for appetite change, fatigue and fever.  HENT:  Positive for hearing loss. Negative for congestion and trouble swallowing.   Eyes:  Negative for visual disturbance.  Respiratory:  Negative for cough, chest tightness and wheezing.   Cardiovascular:  Positive for leg swelling.  Gastrointestinal:  Negative for abdominal pain,  constipation and diarrhea.  Genitourinary:  Negative for dysuria and urgency.  Musculoskeletal:  Positive for arthralgias, back pain and gait problem.  Skin:  Negative for color change.  Neurological:  Negative for tremors and headaches.  Psychiatric/Behavioral:  Negative for behavioral problems and sleep disturbance. The patient is not nervous/anxious.     Immunization History  Administered Date(s) Administered   Influenza, High Dose Seasonal PF 10/06/2016, 10/05/2017, 09/14/2018   Influenza,inj,Quad PF,6+ Mos 10/02/2014   PPD Test 02/22/2023   Tdap 09/29/2022   Pertinent  Health Maintenance Due  Topic Date Due   FOOT EXAM  Never done   OPHTHALMOLOGY EXAM  Never done   INFLUENZA VACCINE  06/02/2023   HEMOGLOBIN A1C  07/03/2023   DEXA SCAN  Completed      03/02/2022    1:19 PM 08/16/2022   11:20 AM 08/30/2022    8:22 AM 09/29/2022    3:01 PM 03/07/2023    9:32 AM  Fall Risk  Falls in the past year? 1 0   1  Was there an injury with Fall? 1    1  Fall Risk Category Calculator 3    3  Fall Risk Category (Retired) High      (RETIRED) Patient Fall Risk Level Low fall risk Low fall risk Low fall risk Moderate fall risk   Patient at Risk for Falls Due to     History of fall(s);Impaired balance/gait  Fall risk Follow up  Falls evaluation completed   Falls evaluation completed   Functional Status Survey:    Vitals:   03/07/23 0933  BP: (!) 171/85  Pulse: 73  Resp: 17  Temp: 97.7 F (36.5 C)  SpO2: 98%  Weight: 130 lb (59 kg)  Height: 5\' 6"  (1.676 m)   Body mass index is 20.98 kg/m. Physical Exam Vitals and nursing note reviewed.  Constitutional:      Appearance: Normal appearance.  HENT:     Head: Normocephalic and atraumatic.     Nose: Nose normal.     Mouth/Throat:     Mouth: Mucous membranes are moist.  Eyes:     Extraocular Movements: Extraocular movements intact.     Conjunctiva/sclera: Conjunctivae normal.     Pupils: Pupils are equal, round, and reactive  to light.  Cardiovascular:     Rate and Rhythm:  Normal rate and regular rhythm.     Heart sounds: No murmur heard.    Comments: Pacemaker left upper chest(defibrillator was removed per patient's request in the past) Pulmonary:     Effort: Pulmonary effort is normal.     Breath sounds: No rales.  Abdominal:     General: Bowel sounds are normal.     Palpations: Abdomen is soft.     Tenderness: There is no abdominal tenderness.  Musculoskeletal:        General: Tenderness present.     Cervical back: Normal range of motion and neck supple.     Right lower leg: Edema present.     Left lower leg: Edema present.     Comments: Lower back, pelvic/thigh pain with movement/weight bearing. Trace edema BLE  Skin:    General: Skin is warm and dry.     Comments: S/p R breat cancer lumpectomy, no apparent edema R arm.   Neurological:     General: No focal deficit present.     Mental Status: She is alert and oriented to person, place, and time. Mental status is at baseline.     Motor: No weakness.     Gait: Gait abnormal.  Psychiatric:        Mood and Affect: Mood normal.        Behavior: Behavior normal.        Thought Content: Thought content normal.     Labs reviewed: Recent Labs    08/02/22 1559 12/08/22 0801 12/31/22 1430  NA 144 140 140  K 3.8 3.3* 3.6  CL 100 102 101  CO2 26 27 26   GLUCOSE 124* 185* 82  BUN 15 15 13   CREATININE 0.89 1.02* 0.94  CALCIUM 9.2 9.0 10.0   Recent Labs    12/08/22 0801  AST 19  ALT 13  ALKPHOS 67  BILITOT 0.8  PROT 6.6  ALBUMIN 4.0   Recent Labs    08/02/22 1559 12/08/22 0801 12/31/22 1430  WBC 7.2 5.9 7.0  NEUTROABS  --  4.4  --   HGB 13.8 14.5 14.7  HCT 40.3 42.2 43.0  MCV 95 93.6 95.3  PLT 145* 153 178   Lab Results  Component Value Date   TSH 1.23 07/20/2011   Lab Results  Component Value Date   HGBA1C 7.5 (H) 12/31/2022   Lab Results  Component Value Date   CHOL 152 11/10/2020   HDL 57 11/10/2020   LDLCALC 68  11/10/2020   TRIG 159 (H) 11/10/2020   CHOLHDL 2.7 11/10/2020    Significant Diagnostic Results in last 30 days:  CUP PACEART REMOTE DEVICE CHECK  Result Date: 03/02/2023 Scheduled remote reviewed. Normal device function.  Since last device remot 1/30 there have been 4 NSVT, 3 show NSVT 7-14 beats one atrial driven 1:1 (3 events on previous HF transmissions) Next remote 91 days. LA, CVRS  CT ABDOMEN PELVIS WO CONTRAST  Result Date: 02/18/2023 CLINICAL DATA:  Blunt abdominal trauma. EXAM: CT ABDOMEN AND PELVIS WITHOUT CONTRAST TECHNIQUE: Multidetector CT imaging of the abdomen and pelvis was performed following the standard protocol without IV contrast. RADIATION DOSE REDUCTION: This exam was performed according to the departmental dose-optimization program which includes automated exposure control, adjustment of the mA and/or kV according to patient size and/or use of iterative reconstruction technique. COMPARISON:  None Available. FINDINGS: Evaluation of this exam is limited in the absence of intravenous contrast. Lower chest: The visualized lung bases are clear. Cardiac pacemaker wires noted. No  intra-abdominal free air or free fluid. Hepatobiliary: The liver is unremarkable. No biliary dilatation. Cholecystectomy. No retained calcified stone noted in the central CBD. Pancreas: The pancreas is unremarkable for patient's age. No active inflammatory changes. Spleen: Normal in size without focal abnormality. Adrenals/Urinary Tract: The adrenal glands are unremarkable. There is mild bilateral hydronephrosis. No stone identified. Several bilateral renal cysts measure up to 4.5 cm in the field of the right kidney. Additional subcentimeter hypodense lesions are too small to characterize. No imaging follow-up. The visualized ureters appear unremarkable. The urinary bladder is grossly unremarkable. Stomach/Bowel: Moderate stool throughout the colon. There is sigmoid diverticulosis without active inflammatory  changes. There is no bowel obstruction or active inflammation. The appendix is normal. Vascular/Lymphatic: Moderate aortoiliac atherosclerotic disease. The IVC is unremarkable. No portal venous gas. There is no adenopathy. Reproductive: Hysterectomy.  No adnexal masses. Other: None Musculoskeletal: There is mild compression fracture of superior endplate of T12 with less than 10% loss of vertebral body height. No retropulsion. Additionally there is an age indeterminate, possibly acute, mild compression fracture of superior endplate of L2 with approximately 10% loss of vertebral body height. There is nondisplaced fractures of the right superior and inferior pubic rami. There is minimally displaced fracture of inferior right sacral al a extending to the right SI joint. The bones are osteopenic.  Grade 1 L4-L5 anterolisthesis. IMPRESSION: 1. Nondisplaced fractures of the right superior and inferior pubic rami. 2. Minimally displaced fracture of the inferior right sacral ala extending to the right SI joint. 3. Mild compression fractures of superior endplates of T12 and L2. 4. Mild bilateral hydronephrosis. No stone identified. 5. Sigmoid diverticulosis. No bowel obstruction. Normal appendix. 6.  Aortic Atherosclerosis (ICD10-I70.0). Electronically Signed   By: Elgie Collard M.D.   On: 02/18/2023 20:51    Assessment/Plan Type 2 diabetes mellitus with renal complication (HCC) T2DM/renal complication, taking insulin 70/30, 03/04/23 hypoglycemic episode(CBG 47) in pm, stabilized after Glucagon 1mg  IM, reduced Aspart 70/30 to 28u/32u, continued am 32 u, prn noon, added qhs CBG check, CBG range 110-210. Hgb A1c 7.5 02/24/23  Closed pelvic fracture [facility]) The patient was admitted to [facility] for therapy following multiple fractures sustained from fall 02/18/23. She was found to have pelvic fractures: nondisplaced fxs of R superior and inferior pubic rami, minimally displaced fx of the inferior R sacral ala extended to the R  SIJ. Mild compression fx of superior endplates of T12, L2. Prn Tylenol, Tramadol, Oxycodone available for pain.  The patient prefers taking Tylenol 500mg  1-2 tabs for pain, may adding Tramadol if needed, avoid Oxycodone use  Sigmoid diverticulitis  taking Bentyl, Imodium  Hyperlipidemia associated with type 2 diabetes mellitus (HCC)  taking Atorvastatin, LDL 58 02/24/23  Vitamin B12 deficiency takes Vit B12, Hgb 12.9 02/24/23  Vitamin D deficiency on Vit D, Vit D 41 02/24/23  SYSTOLIC HEART FAILURE, CHRONIC  taking Furosemide, Bun/creat 17/0.8 02/24/23  HTN (hypertension) Intermittent elevated Sbp, taking Metoprolol, Olmesartan    Family/ staff Communication: plan of care reviewed with the patient and charge nurse.   Labs/tests ordered:  none  Time spend 35 minutes.

## 2023-03-07 NOTE — Assessment & Plan Note (Signed)
T2DM/renal complication, taking insulin 70/30, 03/04/23 hypoglycemic episode(CBG 47) in pm, stabilized after Glucagon 1mg  IM, reduced Aspart 70/30 to 28u/32u, continued am 32 u, prn noon, added qhs CBG check, CBG range 110-210. Hgb A1c 7.5 02/24/23

## 2023-03-07 NOTE — Assessment & Plan Note (Signed)
taking Atorvastatin, LDL 58 02/24/23

## 2023-03-07 NOTE — Assessment & Plan Note (Signed)
The patient was admitted to [facility] for therapy following multiple fractures sustained from fall 02/18/23. She was found to have pelvic fractures: nondisplaced fxs of R superior and inferior pubic rami, minimally displaced fx of the inferior R sacral ala extended to the R SIJ. Mild compression fx of superior endplates of T12, L2. Prn Tylenol, Tramadol, Oxycodone available for pain.  The patient prefers taking Tylenol 500mg  1-2 tabs for pain, may adding Tramadol if needed, avoid Oxycodone use

## 2023-03-07 NOTE — Assessment & Plan Note (Signed)
03/03/23 DEXA t score -1.236, adding Ca, continue Vit D, recommend Fosamax 70mg  qwk vs Evenity 210mg  [place] qmo x12 mos.

## 2023-03-07 NOTE — Assessment & Plan Note (Signed)
takes Vit B12, Hgb 12.9 02/24/23

## 2023-03-07 NOTE — Assessment & Plan Note (Signed)
Intermittent elevated Sbp, taking Metoprolol, Olmesartan

## 2023-03-08 ENCOUNTER — Ambulatory Visit: Payer: Medicare Other | Attending: Internal Medicine

## 2023-03-08 DIAGNOSIS — I5022 Chronic systolic (congestive) heart failure: Secondary | ICD-10-CM

## 2023-03-08 DIAGNOSIS — Z95810 Presence of automatic (implantable) cardiac defibrillator: Secondary | ICD-10-CM

## 2023-03-11 ENCOUNTER — Telehealth: Payer: Self-pay

## 2023-03-11 NOTE — Telephone Encounter (Signed)
Remote ICM transmission received.  Attempted call to patient regarding ICM remote transmission and left detailed message per DPR.  Advised to return call for any fluid symptoms or questions. Next ICM remote transmission scheduled 03/21/2023.    

## 2023-03-11 NOTE — Progress Notes (Signed)
EPIC Encounter for ICM Monitoring  Patient Name: Grace Robinson is a 89 y.o. female Date: 03/11/2023 Primary Care Physican: Creola Corn, MD Primary Cardiologist: Graciela Husbands Electrophysiologist: Joycelyn Schmid Pacing:  98.3%     12/24/2022 Weight:  130 lbs   Since 02-Mar-2023 Time in AT/AF  <0.1 hr/day (<0.1%)           Attempted call to patient and unable to reach.   Transmission reviewed. ED visit 4/19 for fall which resulted in fracture vertebrae and pelvis.   Optivol Thoracic impedance suggesting fluid levels improving and trending back close to baseline.       Prescribed:  Furosemide 20 mg take 1 tablet (20 mg total) by mouth daily alternating (40 mg) every other day.   Potassium 10 mEq 1 tablet twice a day.   Labs: 12/31/2022 Creatinine 0.94, BUN 13, Potassium 3.6, Sodium 140, GFR 56 12/08/2022 Creatinine 1.02, BUN 15, Potassium 3.3, Sodium 140 08/02/2022 Creatinine 0.89, BUN 15, Potassium 3.8, Sodium 144, GFR 60 A complete set of results can be found in Results Review.   Recommendations:  Unable to reach.     Follow-up plan: ICM clinic phone appointment on 03/21/2023 to recheck fluid levels.   91 day device clinic remote transmission 06/01/2023.      EP/Cardiology Office Visits:  Recall 09/03/2023 with Dr Graciela Husbands.     Copy of ICM check sent to Dr. Graciela Husbands.   3 month ICM trend: 03/08/2023.    12-14 Month ICM trend:     Karie Soda, RN 03/11/2023 10:06 AM

## 2023-03-14 ENCOUNTER — Other Ambulatory Visit: Payer: Self-pay

## 2023-03-14 ENCOUNTER — Ambulatory Visit
Admission: RE | Admit: 2023-03-14 | Discharge: 2023-03-14 | Disposition: A | Discharge status: Home / Self Care | Payer: Medicare Other | Source: Ambulatory Visit | Attending: Radiation Oncology | Admitting: Radiation Oncology

## 2023-03-14 DIAGNOSIS — Z510 Encounter for antineoplastic radiation therapy: Secondary | ICD-10-CM | POA: Diagnosis not present

## 2023-03-14 DIAGNOSIS — D0511 Intraductal carcinoma in situ of right breast: Secondary | ICD-10-CM | POA: Insufficient documentation

## 2023-03-14 DIAGNOSIS — Z170 Estrogen receptor positive status [ER+]: Secondary | ICD-10-CM | POA: Insufficient documentation

## 2023-03-21 ENCOUNTER — Ambulatory Visit: Payer: Medicare Other | Attending: Internal Medicine

## 2023-03-21 ENCOUNTER — Telehealth: Payer: Self-pay

## 2023-03-21 DIAGNOSIS — Z95810 Presence of automatic (implantable) cardiac defibrillator: Secondary | ICD-10-CM

## 2023-03-21 DIAGNOSIS — I5022 Chronic systolic (congestive) heart failure: Secondary | ICD-10-CM

## 2023-03-21 NOTE — Progress Notes (Signed)
EPIC Encounter for ICM Monitoring  Patient Name: Grace Robinson is a 89 y.o. female Date: 03/21/2023 Primary Care Physican: Creola Corn, MD Primary Cardiologist: Graciela Husbands Electrophysiologist: Joycelyn Schmid Pacing:  98.3%     12/24/2022 Weight:  130 lbs   Since 02-Mar-2023 Time in AT/AF  <0.1 hr/day (<0.1%)           Attempted call to patient and unable to reach.  Transmission reviewed.   ED visit 4/19 for fall which resulted in fractured vertebrae and pelvis.   Optivol Thoracic impedance suggesting fluid levels returned to normal.       Prescribed:  Furosemide 20 mg take 1 tablet (20 mg total) by mouth daily alternating (40 mg) every other day.   Potassium 10 mEq 1 tablet twice a day.   Labs: 12/31/2022 Creatinine 0.94, BUN 13, Potassium 3.6, Sodium 140, GFR 56 12/08/2022 Creatinine 1.02, BUN 15, Potassium 3.3, Sodium 140 08/02/2022 Creatinine 0.89, BUN 15, Potassium 3.8, Sodium 144, GFR 60 A complete set of results can be found in Results Review.   Recommendations:  Unable to reach.     Follow-up plan: ICM clinic phone appointment on 04/04/2023.   91 day device clinic remote transmission 06/01/2023.      EP/Cardiology Office Visits:  Recall 09/03/2023 with Dr Graciela Husbands.     Copy of ICM check sent to Dr. Graciela Husbands.    3 month ICM trend: 03/21/2023.    12-14 Month ICM trend:     Karie Soda, RN 03/21/2023 9:51 AM

## 2023-03-21 NOTE — Telephone Encounter (Signed)
Remote ICM transmission received.  Attempted call to patient regarding ICM remote transmission and no answer or voice mail option.  

## 2023-03-23 DIAGNOSIS — D0511 Intraductal carcinoma in situ of right breast: Secondary | ICD-10-CM | POA: Diagnosis not present

## 2023-03-23 DIAGNOSIS — Z170 Estrogen receptor positive status [ER+]: Secondary | ICD-10-CM | POA: Diagnosis not present

## 2023-03-23 DIAGNOSIS — Z510 Encounter for antineoplastic radiation therapy: Secondary | ICD-10-CM | POA: Diagnosis not present

## 2023-03-23 NOTE — Progress Notes (Signed)
Remote pacemaker transmission.   

## 2023-03-24 ENCOUNTER — Other Ambulatory Visit: Payer: Self-pay

## 2023-03-24 ENCOUNTER — Ambulatory Visit
Admission: RE | Admit: 2023-03-24 | Discharge: 2023-03-24 | Disposition: A | Discharge status: Home / Self Care | Payer: Medicare Other | Source: Ambulatory Visit | Attending: Radiation Oncology | Admitting: Radiation Oncology

## 2023-03-24 DIAGNOSIS — Z170 Estrogen receptor positive status [ER+]: Secondary | ICD-10-CM | POA: Diagnosis not present

## 2023-03-24 DIAGNOSIS — Z510 Encounter for antineoplastic radiation therapy: Secondary | ICD-10-CM | POA: Diagnosis not present

## 2023-03-24 DIAGNOSIS — D0511 Intraductal carcinoma in situ of right breast: Secondary | ICD-10-CM | POA: Diagnosis not present

## 2023-03-24 LAB — RAD ONC ARIA SESSION SUMMARY
Course Elapsed Days: 0
Plan Fractions Treated to Date: 1
Plan Prescribed Dose Per Fraction: 5.7 Gy
Plan Total Fractions Prescribed: 5
Plan Total Prescribed Dose: 28.5 Gy
Reference Point Dosage Given to Date: 5.7 Gy
Reference Point Session Dosage Given: 5.7 Gy
Session Number: 1

## 2023-03-31 ENCOUNTER — Other Ambulatory Visit: Payer: Self-pay

## 2023-03-31 ENCOUNTER — Ambulatory Visit
Admission: RE | Admit: 2023-03-31 | Discharge: 2023-03-31 | Disposition: A | Discharge status: Home / Self Care | Payer: Medicare Other | Source: Ambulatory Visit | Attending: Radiation Oncology | Admitting: Radiation Oncology

## 2023-03-31 DIAGNOSIS — Z170 Estrogen receptor positive status [ER+]: Secondary | ICD-10-CM | POA: Diagnosis not present

## 2023-03-31 DIAGNOSIS — Z510 Encounter for antineoplastic radiation therapy: Secondary | ICD-10-CM | POA: Diagnosis not present

## 2023-03-31 DIAGNOSIS — D0511 Intraductal carcinoma in situ of right breast: Secondary | ICD-10-CM | POA: Diagnosis not present

## 2023-03-31 LAB — RAD ONC ARIA SESSION SUMMARY
Course Elapsed Days: 7
Plan Fractions Treated to Date: 2
Plan Prescribed Dose Per Fraction: 5.7 Gy
Plan Total Fractions Prescribed: 5
Plan Total Prescribed Dose: 28.5 Gy
Reference Point Dosage Given to Date: 11.4 Gy
Reference Point Session Dosage Given: 5.7 Gy
Session Number: 2

## 2023-04-01 ENCOUNTER — Inpatient Hospital Stay
Admission: RE | Admit: 2023-04-01 | Discharge: 2023-04-01 | Disposition: A | Discharge status: Home / Self Care | Payer: Self-pay | Source: Ambulatory Visit | Attending: Radiation Oncology | Admitting: Radiation Oncology

## 2023-04-01 ENCOUNTER — Other Ambulatory Visit: Payer: Self-pay | Admitting: Radiation Oncology

## 2023-04-01 ENCOUNTER — Non-Acute Institutional Stay (SKILLED_NURSING_FACILITY): Payer: Medicare Other | Admitting: Nurse Practitioner

## 2023-04-01 DIAGNOSIS — Z9889 Other specified postprocedural states: Secondary | ICD-10-CM

## 2023-04-01 DIAGNOSIS — M8580 Other specified disorders of bone density and structure, unspecified site: Secondary | ICD-10-CM

## 2023-04-01 DIAGNOSIS — D0511 Intraductal carcinoma in situ of right breast: Secondary | ICD-10-CM

## 2023-04-01 DIAGNOSIS — E1122 Type 2 diabetes mellitus with diabetic chronic kidney disease: Secondary | ICD-10-CM

## 2023-04-01 DIAGNOSIS — N183 Chronic kidney disease, stage 3 unspecified: Secondary | ICD-10-CM

## 2023-04-01 DIAGNOSIS — I10 Essential (primary) hypertension: Secondary | ICD-10-CM | POA: Diagnosis not present

## 2023-04-01 DIAGNOSIS — Z780 Asymptomatic menopausal state: Secondary | ICD-10-CM

## 2023-04-01 DIAGNOSIS — I5022 Chronic systolic (congestive) heart failure: Secondary | ICD-10-CM | POA: Diagnosis not present

## 2023-04-01 DIAGNOSIS — S3282XD Multiple fractures of pelvis without disruption of pelvic ring, subsequent encounter for fracture with routine healing: Secondary | ICD-10-CM

## 2023-04-01 DIAGNOSIS — Z794 Long term (current) use of insulin: Secondary | ICD-10-CM

## 2023-04-01 NOTE — Progress Notes (Unsigned)
Location:   [facility] Room Number: 22A Place of Service:  SNF (31) Provider:  Remijio Holleran X, NP  Creola Corn, MD  Patient Care Team: Creola Corn, MD as PCP - General (Internal Medicine) Abigail Miyamoto, MD as Consulting Physician (General Surgery) Rachel Moulds, MD as Consulting Physician (Hematology and Oncology) Dorothy Puffer, MD as Consulting Physician (Radiation Oncology) Pershing Proud, RN as Oncology Nurse Navigator Donnelly Angelica, RN as Oncology Nurse Navigator  Extended Emergency Contact Information Primary Emergency Contact: [name] Address: [address]          [place], [place] [address] [place] of [place] Home Phone: [phone] Mobile Phone: [phone] Relation: Son  Code Status:  DNR Goals of care: Advanced Directive information    04/01/2023   12:45 PM  Advanced Directives  Does Patient Have a Medical Advance Directive? Yes  Type of Advance Directive Out of facility DNR (pink MOST or yellow form)  Does patient want to make changes to medical advance directive? No - Patient declined  Pre-existing out of facility DNR order (yellow form or pink MOST form) Yellow form placed in chart (order not valid for inpatient use)     Chief Complaint  Patient presents with  . Medical Management of Chronic Issues    Routine follow up  . Immunizations    COVID booster, shingrix vaccine and pneumonia vaccine due  . Quality Metric Gaps    Foot exam and eye exam due    HPI:  Pt is a 89 y.o. female seen today for medical management of chronic diseases.      T2DM/renal complication, taking insulin 70/30, 03/04/23 hypoglycemic episode(CBG 47) in pm, stabilized after Glucagon 1mg  IM, reduced Aspart 70/30 to 28u/32u, continued am 32 u, prn noon, added qhs CBG check, CBG range 110-210. Hgb A1c 7.5 02/24/23 The patient was admitted to [facility] for therapy following multiple fractures sustained from fall 02/18/23. She was found to have  pelvic fractures: nondisplaced fxs of R superior and inferior pubic rami, minimally displaced fx of the inferior R sacral ala extended to the R SIJ. Mild compression fx of superior endplates of T12, L2. Prn Tylenol, Tramadol, Oxycodone available for pain.  Fall, mechanical,  Mild bilateral hydronephrosis, no stones Sigmoid diverticulitis/IBS taking Bentyl, Imodium Aortic atherosclerosis, taking ASA, Atorvastatin HLD, taking Atorvastatin, LDL 58 02/24/23 Vit B12 deficiency, takes Vit B12, Hgb 12.9 02/24/23 Vit D deficiency, on Vit D, Vit D 41 02/24/23 CHF, taking Furosemide, Bun/creat 17/0.8 02/24/23 HTN, taking Metoprolol, Olmesartan OP 03/03/23 DEXA t score -1.236, on Ca, Vit D, recommend Fosamax 70mg  qwk vs Evenity 210mg  [place] qmo x12 mos.              S/p R breat cancer lumpectomy, f/u surgeon   Past Medical History:  Diagnosis Date  . Biventricular ICD (implantable cardiac defibrillator) in place    downgraded from CRT-D to CRT-P 08/30/22  . Breast cancer (HCC)   . Cellulitis   . Change in bowel habits   . CHF (congestive heart failure) (HCC)   . Colon polyps   . Diabetes mellitus    Type II  . Diarrhea   . Dyslipidemia   . HOH (hard of hearing)    left  . HTN (hypertension)   . LBBB (left bundle branch block)    chronic  . Meniere's disease   . Nonischemic cardiomyopathy (HCC) 2006  . Presence of permanent cardiac pacemaker   . T wave oversensing 01/17/2014  .  Ulcerative colitis   . Varicose veins    4 lazer  treatment each leg   Past Surgical History:  Procedure Laterality Date  . ABDOMINAL HYSTERECTOMY    . BIV PACEMAKER GENERATOR CHANGEOUT N/A 08/30/2022   Procedure: BIV PACEMAKER GENERATOR CHANGEOUT;  Surgeon: Duke Salvia, MD;  Location: [facility] INVASIVE CV LAB;  Service: Cardiovascular;  Laterality: N/A;  . BREAST LUMPECTOMY WITH RADIOACTIVE SEED LOCALIZATION Right 01/05/2023   Procedure: RIGHT BREAST BRACKETED LUMPECTOMY WITH RADIOACTIVE SEED LOCALIZATION;  Surgeon:  Abigail Miyamoto, MD;  Location: [facility] OR;  Service: General;  Laterality: Right;  . CARDIAC CATHETERIZATION  08/2011  . CHOLECYSTECTOMY    . COLONOSCOPY    . EP Study  08/22/08  . IMPLANTABLE CARDIOVERTER DEFIBRILLATOR GENERATOR CHANGE N/A 08/22/2013   Procedure: IMPLANTABLE CARDIOVERTER DEFIBRILLATOR GENERATOR CHANGE;  Surgeon: Duke Salvia, MD;  Location: [facility] CATH LAB;  Service: Cardiovascular;  Laterality: N/A;  . KNEE ARTHROSCOPY Left 04/14/2016   Procedure: LEFT KNEE ARTHROSCOPY, PARTIAL MEDIAL AND PARTIAL LATERAL MENISCECTOMY, MEDIAL PLICA;  Surgeon: Jodi Geralds, MD;  Location: [facility] OR;  Service: Orthopedics;  Laterality: Left;  . mastoid sugery     in the setting of Meniere's disease  . TONSILLECTOMY AND ADENOIDECTOMY      Allergies  Allergen Reactions  . Carvedilol Other (See Comments)    Pt does not remember reaction  Other Reaction(s): Unknown  . Digoxin Other (See Comments)    Dig toxicity  . Vancomycin Other (See Comments)    ("Red Tiana Sivertson Syndrome") Pt states she turned red and was itching  Unknown  ("Red Juma Oxley Syndrome") Pt states she turned red and was itching    Unknown    ("Red Kermitt Harjo Syndrome") Pt states she turned red and was itching  . Pseudoephedrine Other (See Comments)    Makes skin feel like its crawling  Makes skin feel like its crawling Unknown Makes skin feel like its crawling  Makes skin feel like its crawling Unknown  Makes skin feel like its crawling    Makes skin feel like its crawling Unknown Makes skin feel like its crawling    Makes skin feel like its crawling, Unknown    Makes skin feel like its crawling Unknown    Makes skin feel like its crawling    Allergies as of 04/01/2023       Reactions   Carvedilol Other (See Comments)   Pt does not remember reaction Other Reaction(s): Unknown   Digoxin Other (See Comments)   Dig toxicity   Vancomycin Other (See Comments)   ("Red Eloise Picone Syndrome") Pt states she turned red and was itching Unknown  ("Red  Bryanah Sidell Syndrome") Pt states she turned red and was itching    Unknown    ("Red Emmaus Brandi Syndrome") Pt states she turned red and was itching   Pseudoephedrine Other (See Comments)   Makes skin feel like its crawling Makes skin feel like its crawling Unknown Makes skin feel like its crawling  Makes skin feel like its crawling Unknown  Makes skin feel like its crawling    Makes skin feel like its crawling Unknown Makes skin feel like its crawling    Makes skin feel like its crawling, Unknown    Makes skin feel like its crawling Unknown    Makes skin feel like its crawling        Medication List        Accurate as of Apr 01, 2023 11:59 PM. If you have any questions, ask your nurse  or doctor.          STOP taking these medications    cholestyramine 4 g packet Commonly known as: QUESTRAN Stopped by: Nikalas Bramel X Skyelar Halliday, NP   cyanocobalamin 1000 MCG tablet Commonly known as: VITAMIN B12 Stopped by: Makayle Krahn X Renetta Suman, NP   OneTouch Delica Plus Lancet30G Misc Stopped by: Fraya Ueda X Lexa Coronado, NP   OneTouch Verio test strip Generic drug: glucose blood Stopped by: Kenidy Crossland X Sheleen Conchas, NP   vitamin E 180 MG (400 UNITS) capsule Stopped by: Yvette Loveless X Bibiana Gillean, NP       TAKE these medications    acetaminophen 500 MG tablet Commonly known as: TYLENOL Take 500 mg by mouth every 8 (eight) hours as needed for headache.   aspirin EC 81 MG tablet Take 81 mg by mouth daily. Swallow whole.   atorvastatin 40 MG tablet Commonly known as: LIPITOR Take 40 mg by mouth daily.   Cranberry 500 MG Caps Take 2 tablets by mouth daily.   dicyclomine 10 MG capsule Commonly known as: BENTYL TAKE 1 CAPSULE BY MOUTH THREE TIMES DAILY 30 MINUTES BEFORE MEALS   furosemide 20 MG tablet Commonly known as: LASIX Take 1 tablet (20 mg total) by mouth daily. alternating (40 mg) every other day.   insulin aspart protamine - aspart (70-30) 100 UNIT/ML FlexPen Commonly known as: NOVOLOG 70/30 MIX Inject 32 Units into the skin 2 (two)  times daily with a meal. May Inject 15 units if blood sugar is greater than 200   loperamide 2 MG tablet Commonly known as: IMODIUM A-D Take 2 mg by mouth as needed for diarrhea or loose stools.   metoprolol succinate 100 MG 24 hr tablet Commonly known as: TOPROL-XL Take 100 mg by mouth 2 (two) times daily.   olmesartan 40 MG tablet Commonly known as: BENICAR Take 40 mg by mouth daily.   oxyCODONE 5 MG immediate release tablet Commonly known as: Roxicodone Take 0.5-1 tablets (2.5-5 mg total) by mouth every 4 (four) hours as needed for severe pain.   potassium chloride 10 MEQ tablet Commonly known as: KLOR-CON M Take 10 mEq by mouth 2 (two) times daily.   traMADol 50 MG tablet Commonly known as: ULTRAM Take 1 tablet (50 mg total) by mouth every 6 (six) hours as needed.   Vitamin D-3 25 MCG (1000 UT) Caps Take 2,000 Units by mouth daily.        Review of Systems  Constitutional:  Negative for appetite change, fatigue and fever.  HENT:  Positive for hearing loss. Negative for congestion and trouble swallowing.   Eyes:  Negative for visual disturbance.  Respiratory:  Negative for cough, chest tightness and wheezing.   Cardiovascular:  Positive for leg swelling.  Gastrointestinal:  Negative for abdominal pain, constipation and diarrhea.  Genitourinary:  Negative for dysuria and urgency.  Musculoskeletal:  Positive for arthralgias, back pain and gait problem.  Skin:  Negative for color change.  Neurological:  Negative for tremors and headaches.  Psychiatric/Behavioral:  Negative for behavioral problems and sleep disturbance. The patient is not nervous/anxious.     Immunization History  Administered Date(s) Administered  . Influenza, High Dose Seasonal PF 10/06/2016, 10/05/2017, 09/14/2018, 08/17/2022  . Influenza,inj,Quad PF,6+ Mos 10/02/2014  . PNEUMOCOCCAL CONJUGATE-20 03/16/2023  . PPD Test 02/22/2023  . Pneumococcal-Unspecified 03/29/2022  . Tdap 09/29/2022  .  Unspecified SARS-COV-2 Vaccination 11/05/2019, 12/03/2019, 03/31/2021, 07/21/2021, 03/19/2022  . Zoster Recombinat (Shingrix) 03/07/2023   Pertinent  Health Maintenance Due  Topic Date Due  .  FOOT EXAM  Never done  . OPHTHALMOLOGY EXAM  Never done  . INFLUENZA VACCINE  06/02/2023  . HEMOGLOBIN A1C  08/27/2023  . DEXA SCAN  Completed      03/02/2022    1:19 PM 08/16/2022   11:20 AM 08/30/2022    8:22 AM 09/29/2022    3:01 PM 03/07/2023    9:32 AM  Fall Risk  Falls in the past year? 1 0   1  Was there an injury with Fall? 1    1  Fall Risk Category Calculator 3    3  Fall Risk Category (Retired) High      (RETIRED) Patient Fall Risk Level Low fall risk Low fall risk Low fall risk Moderate fall risk   Patient at Risk for Falls Due to     History of fall(s);Impaired balance/gait  Fall risk Follow up  Falls evaluation completed   Falls evaluation completed   Functional Status Survey:    Vitals:   04/01/23 1529  BP: (!) 150/78  Pulse: 72  Resp: 16  Temp: (!) 96.1 F (35.6 C)  SpO2: 96%  Weight: 131 lb 9.6 oz (59.7 kg)   Body mass index is 21.24 kg/m. Physical Exam Vitals and nursing note reviewed.  Constitutional:      Appearance: Normal appearance.  HENT:     Head: Normocephalic and atraumatic.     Nose: Nose normal.     Mouth/Throat:     Mouth: Mucous membranes are moist.  Eyes:     Extraocular Movements: Extraocular movements intact.     Conjunctiva/sclera: Conjunctivae normal.     Pupils: Pupils are equal, round, and reactive to light.  Cardiovascular:     Rate and Rhythm: Normal rate and regular rhythm.     Heart sounds: No murmur heard.    Comments: Pacemaker left upper chest(defibrillator was removed per patient's request in the past) Pulmonary:     Effort: Pulmonary effort is normal.     Breath sounds: No rales.  Abdominal:     General: Bowel sounds are normal.     Palpations: Abdomen is soft.     Tenderness: There is no abdominal tenderness.   Musculoskeletal:        General: Tenderness present.     Cervical back: Normal range of motion and neck supple.     Right lower leg: Edema present.     Left lower leg: Edema present.     Comments: Lower back, pelvic/thigh pain with movement/weight bearing. Trace edema BLE  Skin:    General: Skin is warm and dry.     Comments: S/p R breat cancer lumpectomy, no apparent edema R arm.   Neurological:     General: No focal deficit present.     Mental Status: She is alert and oriented to person, place, and time. Mental status is at baseline.     Motor: No weakness.     Gait: Gait abnormal.  Psychiatric:        Mood and Affect: Mood normal.        Behavior: Behavior normal.        Thought Content: Thought content normal.    Labs reviewed: Recent Labs    08/02/22 1559 12/08/22 0801 12/31/22 1430 02/25/23 0000  NA 144 140 140 143  K 3.8 3.3* 3.6 3.7  CL 100 102 101 104  CO2 26 27 26  29*  GLUCOSE 124* 185* 82  --   BUN 15 15 13 17   CREATININE 0.89  1.02* 0.94 0.8  CALCIUM 9.2 9.0 10.0 9.0   Recent Labs    12/08/22 0801 02/25/23 0000  AST 19 16  ALT 13 10  ALKPHOS 67 62  BILITOT 0.8  --   PROT 6.6  --   ALBUMIN 4.0 3.8   Recent Labs    08/02/22 1559 12/08/22 0801 12/31/22 1430 02/25/23 0000  WBC 7.2 5.9 7.0 5.3  NEUTROABS  --  4.4  --  3,546.00  HGB 13.8 14.5 14.7 12.9  HCT 40.3 42.2 43.0 38  MCV 95 93.6 95.3  --   PLT 145* 153 178 179   Lab Results  Component Value Date   TSH 1.93 02/25/2023   Lab Results  Component Value Date   HGBA1C 7.5 02/25/2023   Lab Results  Component Value Date   CHOL 116 02/25/2023   HDL 35 02/25/2023   LDLCALC 58 02/25/2023   TRIG 144 02/25/2023   CHOLHDL 2.7 11/10/2020    Significant Diagnostic Results in last 30 days:  No results found.  Assessment/Plan SYSTOLIC HEART FAILURE, CHRONIC  taking Furosemide, Bun/creat 17/0.8 02/24/23  HTN (hypertension)  taking Metoprolol, Olmesartan, blood pressure is controlled.    Osteopenia after menopause  03/03/23 DEXA t score -1.236, on Ca, Vit D, recommend Fosamax 70mg  qwk vs Evenity 210mg  [place] qmo x12 mos.   S/P lumpectomy, right breast S/p R breat cancer lumpectomy, f/u surgeon  Closed pelvic fracture [facility]) The patient was admitted to [facility] for therapy following multiple fractures sustained from fall 02/18/23. She was found to have pelvic fractures: nondisplaced fxs of R superior and inferior pubic rami, minimally displaced fx of the inferior R sacral ala extended to the R SIJ. Mild compression fx of superior endplates of T12, L2. Prn Tylenol, Tramadol, Oxycodone available for pain.   Type 2 diabetes mellitus with renal complication (HCC) T2DM/renal complication, taking insulin 70/30, 03/04/23 hypoglycemic episode(CBG 47) in pm, stabilized after Glucagon 1mg  IM, reduced Aspart 70/30 to 28u/32u, continued am 32 u, prn noon, added qhs CBG check, CBG range 110-210. Hgb A1c 7.5 02/24/23     Family/ staff Communication: plan of care reviewed with the patient and charge nurse.   Labs/tests ordered:  none  Time spend 35 minutes.

## 2023-04-03 DIAGNOSIS — R2681 Unsteadiness on feet: Secondary | ICD-10-CM | POA: Diagnosis not present

## 2023-04-03 DIAGNOSIS — M5459 Other low back pain: Secondary | ICD-10-CM | POA: Diagnosis not present

## 2023-04-03 DIAGNOSIS — R2689 Other abnormalities of gait and mobility: Secondary | ICD-10-CM | POA: Diagnosis not present

## 2023-04-03 DIAGNOSIS — R41841 Cognitive communication deficit: Secondary | ICD-10-CM | POA: Diagnosis not present

## 2023-04-03 DIAGNOSIS — M6281 Muscle weakness (generalized): Secondary | ICD-10-CM | POA: Diagnosis not present

## 2023-04-03 DIAGNOSIS — R278 Other lack of coordination: Secondary | ICD-10-CM | POA: Diagnosis not present

## 2023-04-03 DIAGNOSIS — M62512 Muscle wasting and atrophy, not elsewhere classified, left shoulder: Secondary | ICD-10-CM | POA: Diagnosis not present

## 2023-04-03 DIAGNOSIS — M62511 Muscle wasting and atrophy, not elsewhere classified, right shoulder: Secondary | ICD-10-CM | POA: Diagnosis not present

## 2023-04-04 ENCOUNTER — Telehealth: Payer: Self-pay

## 2023-04-04 ENCOUNTER — Ambulatory Visit: Payer: Medicare Other | Attending: Internal Medicine

## 2023-04-04 ENCOUNTER — Encounter: Payer: Self-pay | Admitting: Nurse Practitioner

## 2023-04-04 DIAGNOSIS — I5022 Chronic systolic (congestive) heart failure: Secondary | ICD-10-CM | POA: Diagnosis not present

## 2023-04-04 DIAGNOSIS — Z95810 Presence of automatic (implantable) cardiac defibrillator: Secondary | ICD-10-CM | POA: Diagnosis not present

## 2023-04-04 NOTE — Progress Notes (Signed)
EPIC Encounter for ICM Monitoring  Patient Name: Grace Robinson is a 89 y.o. female Date: 04/04/2023 Primary Care Physican: Creola Corn, MD Primary Cardiologist: Graciela Husbands Electrophysiologist: Joycelyn Schmid Pacing:  98.3%     12/24/2022 Weight:  130 lbs   Clinical Status (21-Mar-2023 to 03-Apr-2023) Time in AT/AF  <0.1 hr/day (<0.1%)           Attempted call to patient and unable to reach.   Transmission reviewed.    Optivol Thoracic impedance suggesting starting 5/25.       Prescribed:  Furosemide 20 mg take 1 tablet (20 mg total) by mouth daily alternating (40 mg) every other day.   Potassium 10 mEq 1 tablet twice a day.   Labs: 02/25/2023 Creatinine 0.8    BUN 17,  Potassium 3.7, Sodium 143 12/31/2022 Creatinine 0.94, BUN 13, Potassium 3.6, Sodium 140, GFR 56 12/08/2022 Creatinine 1.02, BUN 15, Potassium 3.3, Sodium 140 08/02/2022 Creatinine 0.89, BUN 15, Potassium 3.8, Sodium 144, GFR 60 A complete set of results can be found in Results Review.   Recommendations:  Unable to reach.     Follow-up plan: ICM clinic phone appointment on 04/18/2023 to recheck fluid levels.   91 day device clinic remote transmission 06/01/2023.      EP/Cardiology Office Visits:  Recall 09/03/2023 with Dr Graciela Husbands.     Copy of ICM check sent to Dr. Graciela Husbands.    3 month ICM trend: 04/04/2023.    12-14 Month ICM trend:     Karie Soda, RN 04/04/2023 1:06 PM

## 2023-04-04 NOTE — Telephone Encounter (Signed)
Remote ICM transmission received.  Attempted call to patient regarding ICM remote transmission and no answer or voice mail option.  

## 2023-04-04 NOTE — Assessment & Plan Note (Signed)
The patient was admitted to [facility] for therapy following multiple fractures sustained from fall 02/18/23. She was found to have pelvic fractures: nondisplaced fxs of R superior and inferior pubic rami, minimally displaced fx of the inferior R sacral ala extended to the R SIJ. Mild compression fx of superior endplates of T12, L2. Prn Tylenol, Tramadol, Oxycodone available for pain.

## 2023-04-04 NOTE — Assessment & Plan Note (Signed)
03/03/23 DEXA t score -1.236, on Ca, Vit D, recommend Fosamax 70mg  qwk vs Evenity 210mg  [place] qmo x12 mos.

## 2023-04-04 NOTE — Assessment & Plan Note (Signed)
taking Furosemide, Bun/creat 17/0.8 02/24/23 

## 2023-04-04 NOTE — Assessment & Plan Note (Signed)
T2DM/renal complication, taking insulin 70/30, 03/04/23 hypoglycemic episode(CBG 47) in pm, stabilized after Glucagon 1mg IM, reduced Aspart 70/30 to 28u/32u, continued am 32 u, prn noon, added qhs CBG check, CBG range 110-210. Hgb A1c 7.5 02/24/23 

## 2023-04-04 NOTE — Assessment & Plan Note (Signed)
S/p R breat cancer lumpectomy, f/u surgeon

## 2023-04-04 NOTE — Assessment & Plan Note (Signed)
taking Metoprolol, Olmesartan, blood pressure is controlled.

## 2023-04-05 ENCOUNTER — Non-Acute Institutional Stay (SKILLED_NURSING_FACILITY): Payer: Medicare Other | Admitting: Nurse Practitioner

## 2023-04-05 ENCOUNTER — Encounter: Payer: Self-pay | Admitting: Nurse Practitioner

## 2023-04-05 DIAGNOSIS — S3282XD Multiple fractures of pelvis without disruption of pelvic ring, subsequent encounter for fracture with routine healing: Secondary | ICD-10-CM | POA: Diagnosis not present

## 2023-04-05 DIAGNOSIS — E559 Vitamin D deficiency, unspecified: Secondary | ICD-10-CM

## 2023-04-05 DIAGNOSIS — I5022 Chronic systolic (congestive) heart failure: Secondary | ICD-10-CM

## 2023-04-05 DIAGNOSIS — N183 Chronic kidney disease, stage 3 unspecified: Secondary | ICD-10-CM

## 2023-04-05 DIAGNOSIS — R41841 Cognitive communication deficit: Secondary | ICD-10-CM | POA: Diagnosis not present

## 2023-04-05 DIAGNOSIS — M62512 Muscle wasting and atrophy, not elsewhere classified, left shoulder: Secondary | ICD-10-CM | POA: Diagnosis not present

## 2023-04-05 DIAGNOSIS — Q6211 Congenital occlusion of ureteropelvic junction: Secondary | ICD-10-CM | POA: Diagnosis not present

## 2023-04-05 DIAGNOSIS — E1122 Type 2 diabetes mellitus with diabetic chronic kidney disease: Secondary | ICD-10-CM

## 2023-04-05 DIAGNOSIS — K5732 Diverticulitis of large intestine without perforation or abscess without bleeding: Secondary | ICD-10-CM | POA: Diagnosis not present

## 2023-04-05 DIAGNOSIS — R278 Other lack of coordination: Secondary | ICD-10-CM | POA: Diagnosis not present

## 2023-04-05 DIAGNOSIS — Z794 Long term (current) use of insulin: Secondary | ICD-10-CM

## 2023-04-05 DIAGNOSIS — M8580 Other specified disorders of bone density and structure, unspecified site: Secondary | ICD-10-CM

## 2023-04-05 DIAGNOSIS — M62511 Muscle wasting and atrophy, not elsewhere classified, right shoulder: Secondary | ICD-10-CM | POA: Diagnosis not present

## 2023-04-05 DIAGNOSIS — Z9889 Other specified postprocedural states: Secondary | ICD-10-CM

## 2023-04-05 DIAGNOSIS — Z780 Asymptomatic menopausal state: Secondary | ICD-10-CM

## 2023-04-05 DIAGNOSIS — E1169 Type 2 diabetes mellitus with other specified complication: Secondary | ICD-10-CM

## 2023-04-05 DIAGNOSIS — M5459 Other low back pain: Secondary | ICD-10-CM | POA: Diagnosis not present

## 2023-04-05 DIAGNOSIS — I700 Atherosclerosis of aorta: Secondary | ICD-10-CM

## 2023-04-05 DIAGNOSIS — E538 Deficiency of other specified B group vitamins: Secondary | ICD-10-CM

## 2023-04-05 DIAGNOSIS — R2689 Other abnormalities of gait and mobility: Secondary | ICD-10-CM | POA: Diagnosis not present

## 2023-04-05 DIAGNOSIS — M6281 Muscle weakness (generalized): Secondary | ICD-10-CM | POA: Diagnosis not present

## 2023-04-05 DIAGNOSIS — R2681 Unsteadiness on feet: Secondary | ICD-10-CM | POA: Diagnosis not present

## 2023-04-05 DIAGNOSIS — I10 Essential (primary) hypertension: Secondary | ICD-10-CM

## 2023-04-05 DIAGNOSIS — E785 Hyperlipidemia, unspecified: Secondary | ICD-10-CM

## 2023-04-05 NOTE — Progress Notes (Unsigned)
Location:  [facility] Room Number: NO/22/A Place of Service:  SNF (31) Provider:  Lanson Randle X, NP  Creola Corn, MD  Patient Care Team: Creola Corn, MD as PCP - General (Internal Medicine) Abigail Miyamoto, MD as Consulting Physician (General Surgery) Rachel Moulds, MD as Consulting Physician (Hematology and Oncology) Dorothy Puffer, MD as Consulting Physician (Radiation Oncology) Pershing Proud, RN as Oncology Nurse Navigator Donnelly Angelica, RN as Oncology Nurse Navigator  Extended Emergency Contact Information Primary Emergency Contact: [name] Address: [address]          [place], [place] [address] [place] of [place] Home Phone: [phone] Mobile Phone: [phone] Relation: Son  Code Status:  DNR Goals of care: Advanced Directive information    04/01/2023   12:45 PM  Advanced Directives  Does Patient Have a Medical Advance Directive? Yes  Type of Advance Directive Out of facility DNR (pink MOST or yellow form)  Does patient want to make changes to medical advance directive? No - Patient declined  Pre-existing out of facility DNR order (yellow form or pink MOST form) Yellow form placed in chart (order not valid for inpatient use)     Chief Complaint  Patient presents with   Acute Visit    Patient is being discharged  back to the assisted living quarters    HPI:  Pt is a 89 y.o. female seen today for an acute visit for    Past Medical History:  Diagnosis Date   Biventricular ICD (implantable cardiac defibrillator) in place    downgraded from CRT-D to CRT-P 08/30/22   Breast cancer (HCC)    Cellulitis    Change in bowel habits    CHF (congestive heart failure) (HCC)    Colon polyps    Diabetes mellitus    Type II   Diarrhea    Dyslipidemia    HOH (hard of hearing)    left   HTN (hypertension)    LBBB (left bundle branch block)    chronic   Meniere's disease    Nonischemic cardiomyopathy (HCC) 2006    Presence of permanent cardiac pacemaker    T wave oversensing 01/17/2014   Ulcerative colitis    Varicose veins    4 lazer  treatment each leg   Past Surgical History:  Procedure Laterality Date   ABDOMINAL HYSTERECTOMY     BIV PACEMAKER GENERATOR CHANGEOUT N/A 08/30/2022   Procedure: BIV PACEMAKER GENERATOR CHANGEOUT;  Surgeon: Duke Salvia, MD;  Location[facility] INVASIVE CV LAB;  Service: Cardiovascular;  Laterality: N/A;   BREAST LUMPECTOMY WITH RADIOACTIVE SEED LOCALIZATION Right 01/05/2023   Procedure: RIGHT BREAST BRACKETED LUMPECTOMY WITH RADIOACTIVE SEED LOCALIZATION;  Surgeon: Abigail Miyamoto, MD;  Location: [facility] OR;  Service: General;  Laterality: Right;   CARDIAC CATHETERIZATION  08/2011   CHOLECYSTECTOMY     COLONOSCOPY     EP Study  08/22/08   IMPLANTABLE CARDIOVERTER DEFIBRILLATOR GENERATOR CHANGE N/A 08/22/2013   Procedure: IMPLANTABLE CARDIOVERTER DEFIBRILLATOR GENERATOR CHANGE;  Surgeon: Duke Salvia, MD;  Location: [facility] CATH LAB;  Service: Cardiovascular;  Laterality: N/A;   KNEE ARTHROSCOPY Left 04/14/2016   Procedure: LEFT KNEE ARTHROSCOPY, PARTIAL MEDIAL AND PARTIAL LATERAL MENISCECTOMY, MEDIAL PLICA;  Surgeon: Jodi Geralds, MD;  Location: [facility] OR;  Service: Orthopedics;  Laterality: Left;   mastoid sugery     in the setting of Meniere's disease   TONSILLECTOMY AND ADENOIDECTOMY      Allergies  Allergen Reactions   Carvedilol Other (  See Comments)    Pt does not remember reaction  Other Reaction(s): Unknown   Digoxin Other (See Comments)    Dig toxicity   Vancomycin Other (See Comments)    ("Red Floride Hutmacher Syndrome") Pt states she turned red and was itching  Unknown  ("Red Leonte Horrigan Syndrome") Pt states she turned red and was itching    Unknown    ("Red Kendrix Orman Syndrome") Pt states she turned red and was itching   Pseudoephedrine Other (See Comments)    Makes skin feel like its crawling  Makes skin feel like its crawling Unknown Makes skin feel like its crawling  Makes skin  feel like its crawling Unknown  Makes skin feel like its crawling    Makes skin feel like its crawling Unknown Makes skin feel like its crawling    Makes skin feel like its crawling, Unknown    Makes skin feel like its crawling Unknown    Makes skin feel like its crawling    Outpatient Encounter Medications as of 04/05/2023  Medication Sig   acetaminophen (TYLENOL) 500 MG tablet Take 500 mg by mouth every 8 (eight) hours as needed for headache.   aspirin EC 81 MG tablet Take 81 mg by mouth daily. Swallow whole.   atorvastatin (LIPITOR) 40 MG tablet Take 40 mg by mouth daily.   Cholecalciferol (VITAMIN D-3) 25 MCG (1000 UT) CAPS Take 2,000 Units by mouth daily.   Cranberry 500 MG CAPS Take 2 tablets by mouth daily.   dicyclomine (BENTYL) 10 MG capsule TAKE 1 CAPSULE BY MOUTH THREE TIMES DAILY 30 MINUTES BEFORE MEALS   furosemide (LASIX) 20 MG tablet Take 1 tablet (20 mg total) by mouth daily. alternating (40 mg) every other day.   insulin aspart protamine - aspart (NOVOLOG 70/30 MIX) (70-30) 100 UNIT/ML FlexPen Inject 32 Units into the skin 2 (two) times daily with a meal. May Inject 15 units if blood sugar is greater than 200   loperamide (IMODIUM A-D) 2 MG tablet Take 2 mg by mouth as needed for diarrhea or loose stools.   metoprolol succinate (TOPROL-XL) 100 MG 24 hr tablet Take 100 mg by mouth 2 (two) times daily.   olmesartan (BENICAR) 40 MG tablet Take 40 mg by mouth daily.   oxyCODONE (ROXICODONE) 5 MG immediate release tablet Take 0.5-1 tablets (2.5-5 mg total) by mouth every 4 (four) hours as needed for severe pain.   potassium chloride (K-DUR) 10 MEQ tablet Take 10 mEq by mouth 2 (two) times daily.   traMADol (ULTRAM) 50 MG tablet Take 1 tablet (50 mg total) by mouth every 6 (six) hours as needed.   No facility-administered encounter medications on file as of 04/05/2023.    Review of Systems  Immunization History  Administered Date(s) Administered   Influenza, High Dose  Seasonal PF 10/06/2016, 10/05/2017, 09/14/2018, 08/17/2022   Influenza,inj,Quad PF,6+ Mos 10/02/2014   PNEUMOCOCCAL CONJUGATE-20 03/16/2023   PPD Test 02/22/2023   Pneumococcal-Unspecified 03/29/2022   Tdap 09/29/2022   Unspecified SARS-COV-2 Vaccination 11/05/2019, 12/03/2019, 03/31/2021, 07/21/2021, 03/19/2022   Zoster Recombinat (Shingrix) 03/07/2023   Pertinent  Health Maintenance Due  Topic Date Due   FOOT EXAM  Never done   OPHTHALMOLOGY EXAM  Never done   INFLUENZA VACCINE  06/02/2023   HEMOGLOBIN A1C  08/27/2023   DEXA SCAN  Completed      03/02/2022    1:19 PM 08/16/2022   11:20 AM 08/30/2022    8:22 AM 09/29/2022    3:01 PM 03/07/2023  9:32 AM  Fall Risk  Falls in the past year? 1 0   1  Was there an injury with Fall? 1    1  Fall Risk Category Calculator 3    3  Fall Risk Category (Retired) High      (RETIRED) Patient Fall Risk Level Low fall risk Low fall risk Low fall risk Moderate fall risk   Patient at Risk for Falls Due to     History of fall(s);Impaired balance/gait  Fall risk Follow up  Falls evaluation completed   Falls evaluation completed   Functional Status Survey:    Vitals:   04/05/23 1556  BP: (!) 150/78  Pulse: 72  Resp: 16  Temp: (!) 96.1 F (35.6 C)  SpO2: 96%  Weight: 131 lb 9.6 oz (59.7 kg)  Height: 5\' 6"  (1.676 m)   Body mass index is 21.24 kg/m. Physical Exam  Labs reviewed: Recent Labs    08/02/22 1559 12/08/22 0801 12/31/22 1430 02/25/23 0000  NA 144 140 140 143  K 3.8 3.3* 3.6 3.7  CL 100 102 101 104  CO2 26 27 26  29*  GLUCOSE 124* 185* 82  --   BUN 15 15 13 17   CREATININE 0.89 1.02* 0.94 0.8  CALCIUM 9.2 9.0 10.0 9.0   Recent Labs    12/08/22 0801 02/25/23 0000  AST 19 16  ALT 13 10  ALKPHOS 67 62  BILITOT 0.8  --   PROT 6.6  --   ALBUMIN 4.0 3.8   Recent Labs    08/02/22 1559 12/08/22 0801 12/31/22 1430 02/25/23 0000  WBC 7.2 5.9 7.0 5.3  NEUTROABS  --  4.4  --  3,546.00  HGB 13.8 14.5 14.7 12.9   HCT 40.3 42.2 43.0 38  MCV 95 93.6 95.3  --   PLT 145* 153 178 179   Lab Results  Component Value Date   TSH 1.93 02/25/2023   Lab Results  Component Value Date   HGBA1C 7.5 02/25/2023   Lab Results  Component Value Date   CHOL 116 02/25/2023   HDL 35 02/25/2023   LDLCALC 58 02/25/2023   TRIG 144 02/25/2023   CHOLHDL 2.7 11/10/2020    Significant Diagnostic Results in last 30 days:  No results found.  Assessment/Plan There are no diagnoses linked to this encounter.   Family/ staff Communication: ***  Labs/tests ordered:  ***

## 2023-04-06 DIAGNOSIS — M62511 Muscle wasting and atrophy, not elsewhere classified, right shoulder: Secondary | ICD-10-CM | POA: Diagnosis not present

## 2023-04-06 DIAGNOSIS — R278 Other lack of coordination: Secondary | ICD-10-CM | POA: Diagnosis not present

## 2023-04-06 DIAGNOSIS — M62512 Muscle wasting and atrophy, not elsewhere classified, left shoulder: Secondary | ICD-10-CM | POA: Diagnosis not present

## 2023-04-06 DIAGNOSIS — R2689 Other abnormalities of gait and mobility: Secondary | ICD-10-CM | POA: Diagnosis not present

## 2023-04-06 DIAGNOSIS — R41841 Cognitive communication deficit: Secondary | ICD-10-CM | POA: Diagnosis not present

## 2023-04-06 DIAGNOSIS — M6281 Muscle weakness (generalized): Secondary | ICD-10-CM | POA: Diagnosis not present

## 2023-04-06 DIAGNOSIS — M5459 Other low back pain: Secondary | ICD-10-CM | POA: Diagnosis not present

## 2023-04-06 DIAGNOSIS — R2681 Unsteadiness on feet: Secondary | ICD-10-CM | POA: Diagnosis not present

## 2023-04-07 ENCOUNTER — Other Ambulatory Visit: Payer: Self-pay

## 2023-04-07 ENCOUNTER — Ambulatory Visit
Admission: RE | Admit: 2023-04-07 | Discharge: 2023-04-07 | Disposition: A | Discharge status: Home / Self Care | Payer: Medicare Other | Source: Ambulatory Visit | Attending: Radiation Oncology | Admitting: Radiation Oncology

## 2023-04-07 ENCOUNTER — Encounter: Payer: Self-pay | Admitting: Nurse Practitioner

## 2023-04-07 DIAGNOSIS — D0511 Intraductal carcinoma in situ of right breast: Secondary | ICD-10-CM | POA: Insufficient documentation

## 2023-04-07 DIAGNOSIS — Z170 Estrogen receptor positive status [ER+]: Secondary | ICD-10-CM | POA: Diagnosis not present

## 2023-04-07 DIAGNOSIS — Z510 Encounter for antineoplastic radiation therapy: Secondary | ICD-10-CM | POA: Insufficient documentation

## 2023-04-07 LAB — RAD ONC ARIA SESSION SUMMARY
Course Elapsed Days: 14
Plan Fractions Treated to Date: 3
Plan Prescribed Dose Per Fraction: 5.7 Gy
Plan Total Fractions Prescribed: 5
Plan Total Prescribed Dose: 28.5 Gy
Reference Point Dosage Given to Date: 17.1 Gy
Reference Point Session Dosage Given: 5.7 Gy
Session Number: 3

## 2023-04-07 NOTE — Assessment & Plan Note (Signed)
on Vit D, Vit D 41 02/24/23 

## 2023-04-07 NOTE — Assessment & Plan Note (Signed)
taking Atorvastatin, LDL 58 02/24/23 

## 2023-04-07 NOTE — Assessment & Plan Note (Signed)
Sigmoid diverticulitis/IBS taking Bentyl, Imodium, the patient is aware of B vs R of medications.

## 2023-04-07 NOTE — Assessment & Plan Note (Signed)
takes Vit B12, Hgb 12.9 02/24/23 

## 2023-04-07 NOTE — Assessment & Plan Note (Signed)
no stones

## 2023-04-07 NOTE — Assessment & Plan Note (Addendum)
S/p R breat cancer lumpectomy, f/u surgeon, 03/15/23 cancer center radiation tx

## 2023-04-07 NOTE — Assessment & Plan Note (Signed)
taking insulin 70/30, 03/04/23 hypoglycemic episode(CBG 47) in pm, stabilized after Glucagon 1mg  IM, reduced Aspart 70/30 to 28u/32u, continued am 32 u, prn noon, Hgb A1c 7.5 02/24/23

## 2023-04-07 NOTE — Assessment & Plan Note (Signed)
Healing nicely, pain is minimal, Tylenol is adequate, ambulates with walker, the patient has a good safety awareness

## 2023-04-07 NOTE — Assessment & Plan Note (Signed)
taking ASA, Atorvastatin

## 2023-04-07 NOTE — Assessment & Plan Note (Signed)
Blood pressure is controlled,  taking Metoprolol, Olmesartan

## 2023-04-07 NOTE — Assessment & Plan Note (Signed)
Compensated clinically,  taking Furosemide, Bun/creat 17/0.8 02/24/23

## 2023-04-07 NOTE — Assessment & Plan Note (Addendum)
03/03/23 DEXA t score -1.236, on Ca, Vit D, recommend Fosamax 70mg  qwk vs Evenity 210mg  Ethel qmo x12 mos, Calcium supplement as well.

## 2023-04-08 DIAGNOSIS — M62511 Muscle wasting and atrophy, not elsewhere classified, right shoulder: Secondary | ICD-10-CM | POA: Diagnosis not present

## 2023-04-08 DIAGNOSIS — M6281 Muscle weakness (generalized): Secondary | ICD-10-CM | POA: Diagnosis not present

## 2023-04-08 DIAGNOSIS — M62512 Muscle wasting and atrophy, not elsewhere classified, left shoulder: Secondary | ICD-10-CM | POA: Diagnosis not present

## 2023-04-08 DIAGNOSIS — R41841 Cognitive communication deficit: Secondary | ICD-10-CM | POA: Diagnosis not present

## 2023-04-08 DIAGNOSIS — R2689 Other abnormalities of gait and mobility: Secondary | ICD-10-CM | POA: Diagnosis not present

## 2023-04-08 DIAGNOSIS — R2681 Unsteadiness on feet: Secondary | ICD-10-CM | POA: Diagnosis not present

## 2023-04-08 DIAGNOSIS — R278 Other lack of coordination: Secondary | ICD-10-CM | POA: Diagnosis not present

## 2023-04-08 DIAGNOSIS — M5459 Other low back pain: Secondary | ICD-10-CM | POA: Diagnosis not present

## 2023-04-12 ENCOUNTER — Inpatient Hospital Stay: Payer: Medicare Other | Attending: Hematology and Oncology | Admitting: Adult Health

## 2023-04-12 DIAGNOSIS — R2681 Unsteadiness on feet: Secondary | ICD-10-CM | POA: Diagnosis not present

## 2023-04-12 DIAGNOSIS — R278 Other lack of coordination: Secondary | ICD-10-CM | POA: Diagnosis not present

## 2023-04-12 DIAGNOSIS — M62511 Muscle wasting and atrophy, not elsewhere classified, right shoulder: Secondary | ICD-10-CM | POA: Diagnosis not present

## 2023-04-12 DIAGNOSIS — M5459 Other low back pain: Secondary | ICD-10-CM | POA: Diagnosis not present

## 2023-04-12 DIAGNOSIS — R41841 Cognitive communication deficit: Secondary | ICD-10-CM | POA: Diagnosis not present

## 2023-04-12 DIAGNOSIS — M6281 Muscle weakness (generalized): Secondary | ICD-10-CM | POA: Diagnosis not present

## 2023-04-12 DIAGNOSIS — M62512 Muscle wasting and atrophy, not elsewhere classified, left shoulder: Secondary | ICD-10-CM | POA: Diagnosis not present

## 2023-04-12 DIAGNOSIS — R2689 Other abnormalities of gait and mobility: Secondary | ICD-10-CM | POA: Diagnosis not present

## 2023-04-13 DIAGNOSIS — R2681 Unsteadiness on feet: Secondary | ICD-10-CM | POA: Diagnosis not present

## 2023-04-13 DIAGNOSIS — M6281 Muscle weakness (generalized): Secondary | ICD-10-CM | POA: Diagnosis not present

## 2023-04-13 DIAGNOSIS — M62512 Muscle wasting and atrophy, not elsewhere classified, left shoulder: Secondary | ICD-10-CM | POA: Diagnosis not present

## 2023-04-13 DIAGNOSIS — M62511 Muscle wasting and atrophy, not elsewhere classified, right shoulder: Secondary | ICD-10-CM | POA: Diagnosis not present

## 2023-04-13 DIAGNOSIS — R278 Other lack of coordination: Secondary | ICD-10-CM | POA: Diagnosis not present

## 2023-04-13 DIAGNOSIS — R2689 Other abnormalities of gait and mobility: Secondary | ICD-10-CM | POA: Diagnosis not present

## 2023-04-13 DIAGNOSIS — R41841 Cognitive communication deficit: Secondary | ICD-10-CM | POA: Diagnosis not present

## 2023-04-13 DIAGNOSIS — M5459 Other low back pain: Secondary | ICD-10-CM | POA: Diagnosis not present

## 2023-04-14 ENCOUNTER — Other Ambulatory Visit: Payer: Self-pay

## 2023-04-14 ENCOUNTER — Ambulatory Visit
Admission: RE | Admit: 2023-04-14 | Discharge: 2023-04-14 | Disposition: A | Discharge status: Home / Self Care | Payer: Medicare Other | Source: Ambulatory Visit | Attending: Radiation Oncology | Admitting: Radiation Oncology

## 2023-04-14 ENCOUNTER — Encounter: Payer: Medicare Other | Admitting: Adult Health

## 2023-04-14 DIAGNOSIS — D0511 Intraductal carcinoma in situ of right breast: Secondary | ICD-10-CM | POA: Diagnosis not present

## 2023-04-14 DIAGNOSIS — Z510 Encounter for antineoplastic radiation therapy: Secondary | ICD-10-CM | POA: Diagnosis not present

## 2023-04-14 DIAGNOSIS — Z170 Estrogen receptor positive status [ER+]: Secondary | ICD-10-CM | POA: Diagnosis not present

## 2023-04-14 LAB — RAD ONC ARIA SESSION SUMMARY
Course Elapsed Days: 21
Plan Fractions Treated to Date: 4
Plan Prescribed Dose Per Fraction: 5.7 Gy
Plan Total Fractions Prescribed: 5
Plan Total Prescribed Dose: 28.5 Gy
Reference Point Dosage Given to Date: 22.8 Gy
Reference Point Session Dosage Given: 5.7 Gy
Session Number: 4

## 2023-04-15 ENCOUNTER — Telehealth: Payer: Self-pay | Admitting: Adult Health

## 2023-04-15 DIAGNOSIS — R2689 Other abnormalities of gait and mobility: Secondary | ICD-10-CM | POA: Diagnosis not present

## 2023-04-15 DIAGNOSIS — M6281 Muscle weakness (generalized): Secondary | ICD-10-CM | POA: Diagnosis not present

## 2023-04-15 DIAGNOSIS — R278 Other lack of coordination: Secondary | ICD-10-CM | POA: Diagnosis not present

## 2023-04-15 DIAGNOSIS — M5459 Other low back pain: Secondary | ICD-10-CM | POA: Diagnosis not present

## 2023-04-15 DIAGNOSIS — M62512 Muscle wasting and atrophy, not elsewhere classified, left shoulder: Secondary | ICD-10-CM | POA: Diagnosis not present

## 2023-04-15 DIAGNOSIS — M62511 Muscle wasting and atrophy, not elsewhere classified, right shoulder: Secondary | ICD-10-CM | POA: Diagnosis not present

## 2023-04-15 DIAGNOSIS — R41841 Cognitive communication deficit: Secondary | ICD-10-CM | POA: Diagnosis not present

## 2023-04-15 DIAGNOSIS — R2681 Unsteadiness on feet: Secondary | ICD-10-CM | POA: Diagnosis not present

## 2023-04-15 NOTE — Telephone Encounter (Signed)
Called patient twice, also left a message regarding appointment times/dates.

## 2023-04-18 ENCOUNTER — Ambulatory Visit: Payer: Medicare Other | Attending: Internal Medicine

## 2023-04-18 DIAGNOSIS — R41841 Cognitive communication deficit: Secondary | ICD-10-CM | POA: Diagnosis not present

## 2023-04-18 DIAGNOSIS — I5022 Chronic systolic (congestive) heart failure: Secondary | ICD-10-CM

## 2023-04-18 DIAGNOSIS — R2689 Other abnormalities of gait and mobility: Secondary | ICD-10-CM | POA: Diagnosis not present

## 2023-04-18 DIAGNOSIS — R2681 Unsteadiness on feet: Secondary | ICD-10-CM | POA: Diagnosis not present

## 2023-04-18 DIAGNOSIS — M6281 Muscle weakness (generalized): Secondary | ICD-10-CM | POA: Diagnosis not present

## 2023-04-18 DIAGNOSIS — M62512 Muscle wasting and atrophy, not elsewhere classified, left shoulder: Secondary | ICD-10-CM | POA: Diagnosis not present

## 2023-04-18 DIAGNOSIS — M5459 Other low back pain: Secondary | ICD-10-CM | POA: Diagnosis not present

## 2023-04-18 DIAGNOSIS — Z95810 Presence of automatic (implantable) cardiac defibrillator: Secondary | ICD-10-CM | POA: Diagnosis not present

## 2023-04-18 DIAGNOSIS — R278 Other lack of coordination: Secondary | ICD-10-CM | POA: Diagnosis not present

## 2023-04-18 DIAGNOSIS — M62511 Muscle wasting and atrophy, not elsewhere classified, right shoulder: Secondary | ICD-10-CM | POA: Diagnosis not present

## 2023-04-19 ENCOUNTER — Telehealth: Payer: Self-pay

## 2023-04-19 DIAGNOSIS — R41841 Cognitive communication deficit: Secondary | ICD-10-CM | POA: Diagnosis not present

## 2023-04-19 DIAGNOSIS — R2689 Other abnormalities of gait and mobility: Secondary | ICD-10-CM | POA: Diagnosis not present

## 2023-04-19 DIAGNOSIS — R2681 Unsteadiness on feet: Secondary | ICD-10-CM | POA: Diagnosis not present

## 2023-04-19 DIAGNOSIS — R278 Other lack of coordination: Secondary | ICD-10-CM | POA: Diagnosis not present

## 2023-04-19 DIAGNOSIS — M6281 Muscle weakness (generalized): Secondary | ICD-10-CM | POA: Diagnosis not present

## 2023-04-19 DIAGNOSIS — M62512 Muscle wasting and atrophy, not elsewhere classified, left shoulder: Secondary | ICD-10-CM | POA: Diagnosis not present

## 2023-04-19 DIAGNOSIS — M62511 Muscle wasting and atrophy, not elsewhere classified, right shoulder: Secondary | ICD-10-CM | POA: Diagnosis not present

## 2023-04-19 DIAGNOSIS — M5459 Other low back pain: Secondary | ICD-10-CM | POA: Diagnosis not present

## 2023-04-19 NOTE — Progress Notes (Signed)
EPIC Encounter for ICM Monitoring  Patient Name: Grace Robinson is a 89 y.o. female Date: 04/19/2023 Primary Care Physican: Creola Corn, MD Primary Cardiologist: Graciela Husbands Electrophysiologist: Joycelyn Schmid Pacing:  97.7%     12/24/2022 Weight:  130 lbs   Clinical Status (21-Mar-2023 to 03-Apr-2023) Time in AT/AF  <0.1 hr/day (<0.1%)           Attempted call to patient and unable to reach.   Transmission reviewed.    Optivol Thoracic impedance suggesting fluid levels returned close to normal.       Prescribed:  Furosemide 20 mg take 1 tablet (20 mg total) by mouth daily alternating (40 mg) every other day.   Potassium 10 mEq 1 tablet twice a day.   Labs: 02/25/2023 Creatinine 0.8    BUN 17,  Potassium 3.7, Sodium 143 12/31/2022 Creatinine 0.94, BUN 13, Potassium 3.6, Sodium 140, GFR 56 12/08/2022 Creatinine 1.02, BUN 15, Potassium 3.3, Sodium 140 08/02/2022 Creatinine 0.89, BUN 15, Potassium 3.8, Sodium 144, GFR 60 A complete set of results can be found in Results Review.   Recommendations:  Unable to reach.     Follow-up plan: ICM clinic phone appointment on 05/09/2023.   91 day device clinic remote transmission 06/01/2023.      EP/Cardiology Office Visits:  Recall 09/03/2023 with Dr Graciela Husbands.     Copy of ICM check sent to Dr. Graciela Husbands.    3 month ICM trend: 04/18/2023.    12-14 Month ICM trend:     Karie Soda, RN 04/19/2023 3:34 PM

## 2023-04-19 NOTE — Telephone Encounter (Signed)
Remote ICM transmission received.  Attempted call to patient regarding ICM remote transmission and left detailed message per DPR.  Left ICM phone number and advised to return call for any fluid symptoms or questions. Next ICM remote transmission scheduled 05/09/2023.    

## 2023-04-20 DIAGNOSIS — R2681 Unsteadiness on feet: Secondary | ICD-10-CM | POA: Diagnosis not present

## 2023-04-20 DIAGNOSIS — M5459 Other low back pain: Secondary | ICD-10-CM | POA: Diagnosis not present

## 2023-04-20 DIAGNOSIS — R2689 Other abnormalities of gait and mobility: Secondary | ICD-10-CM | POA: Diagnosis not present

## 2023-04-20 DIAGNOSIS — M6281 Muscle weakness (generalized): Secondary | ICD-10-CM | POA: Diagnosis not present

## 2023-04-20 DIAGNOSIS — R41841 Cognitive communication deficit: Secondary | ICD-10-CM | POA: Diagnosis not present

## 2023-04-20 DIAGNOSIS — M62512 Muscle wasting and atrophy, not elsewhere classified, left shoulder: Secondary | ICD-10-CM | POA: Diagnosis not present

## 2023-04-20 DIAGNOSIS — M62511 Muscle wasting and atrophy, not elsewhere classified, right shoulder: Secondary | ICD-10-CM | POA: Diagnosis not present

## 2023-04-20 DIAGNOSIS — R278 Other lack of coordination: Secondary | ICD-10-CM | POA: Diagnosis not present

## 2023-04-21 ENCOUNTER — Ambulatory Visit
Admission: RE | Admit: 2023-04-21 | Discharge: 2023-04-21 | Disposition: A | Discharge status: Home / Self Care | Payer: Medicare Other | Source: Ambulatory Visit | Attending: Radiation Oncology | Admitting: Radiation Oncology

## 2023-04-21 ENCOUNTER — Other Ambulatory Visit: Payer: Self-pay

## 2023-04-21 DIAGNOSIS — D0511 Intraductal carcinoma in situ of right breast: Secondary | ICD-10-CM | POA: Diagnosis not present

## 2023-04-21 DIAGNOSIS — Z510 Encounter for antineoplastic radiation therapy: Secondary | ICD-10-CM | POA: Diagnosis not present

## 2023-04-21 DIAGNOSIS — Z170 Estrogen receptor positive status [ER+]: Secondary | ICD-10-CM | POA: Diagnosis not present

## 2023-04-21 LAB — RAD ONC ARIA SESSION SUMMARY
Course Elapsed Days: 28
Plan Fractions Treated to Date: 5
Plan Prescribed Dose Per Fraction: 5.7 Gy
Plan Total Fractions Prescribed: 5
Plan Total Prescribed Dose: 28.5 Gy
Reference Point Dosage Given to Date: 28.5 Gy
Reference Point Session Dosage Given: 5.7 Gy
Session Number: 5

## 2023-04-22 DIAGNOSIS — M62512 Muscle wasting and atrophy, not elsewhere classified, left shoulder: Secondary | ICD-10-CM | POA: Diagnosis not present

## 2023-04-22 DIAGNOSIS — R41841 Cognitive communication deficit: Secondary | ICD-10-CM | POA: Diagnosis not present

## 2023-04-22 DIAGNOSIS — R2681 Unsteadiness on feet: Secondary | ICD-10-CM | POA: Diagnosis not present

## 2023-04-22 DIAGNOSIS — M6281 Muscle weakness (generalized): Secondary | ICD-10-CM | POA: Diagnosis not present

## 2023-04-22 DIAGNOSIS — R2689 Other abnormalities of gait and mobility: Secondary | ICD-10-CM | POA: Diagnosis not present

## 2023-04-22 DIAGNOSIS — M5459 Other low back pain: Secondary | ICD-10-CM | POA: Diagnosis not present

## 2023-04-22 DIAGNOSIS — M62511 Muscle wasting and atrophy, not elsewhere classified, right shoulder: Secondary | ICD-10-CM | POA: Diagnosis not present

## 2023-04-22 DIAGNOSIS — R278 Other lack of coordination: Secondary | ICD-10-CM | POA: Diagnosis not present

## 2023-04-22 NOTE — Radiation Completion Notes (Signed)
  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 The patient tolerated radiation. She developed fatigue and anticipated skin changes in the treatment field.   The patient will receive a call in about one month from the radiation oncology department. She will continue follow up with Dr. Al Pimple as well.      Osker Mason, PAC

## 2023-04-25 DIAGNOSIS — R2681 Unsteadiness on feet: Secondary | ICD-10-CM | POA: Diagnosis not present

## 2023-04-25 DIAGNOSIS — M5459 Other low back pain: Secondary | ICD-10-CM | POA: Diagnosis not present

## 2023-04-25 DIAGNOSIS — M62512 Muscle wasting and atrophy, not elsewhere classified, left shoulder: Secondary | ICD-10-CM | POA: Diagnosis not present

## 2023-04-25 DIAGNOSIS — R41841 Cognitive communication deficit: Secondary | ICD-10-CM | POA: Diagnosis not present

## 2023-04-25 DIAGNOSIS — M62511 Muscle wasting and atrophy, not elsewhere classified, right shoulder: Secondary | ICD-10-CM | POA: Diagnosis not present

## 2023-04-25 DIAGNOSIS — R2689 Other abnormalities of gait and mobility: Secondary | ICD-10-CM | POA: Diagnosis not present

## 2023-04-25 DIAGNOSIS — M6281 Muscle weakness (generalized): Secondary | ICD-10-CM | POA: Diagnosis not present

## 2023-04-25 DIAGNOSIS — R278 Other lack of coordination: Secondary | ICD-10-CM | POA: Diagnosis not present

## 2023-04-27 ENCOUNTER — Non-Acute Institutional Stay: Payer: Medicare Other | Admitting: Orthopedic Surgery

## 2023-04-27 ENCOUNTER — Encounter: Payer: Self-pay | Admitting: Orthopedic Surgery

## 2023-04-27 DIAGNOSIS — R21 Rash and other nonspecific skin eruption: Secondary | ICD-10-CM

## 2023-04-27 MED ORDER — INSULIN ASPART PROT & ASPART (70-30 MIX) 100 UNIT/ML PEN: 28.0000 [IU] | PEN_INJECTOR | Freq: Every day | SUBCUTANEOUS | 11 refills | Status: DC

## 2023-04-27 MED ORDER — INSULIN ASPART PROT & ASPART (70-30 MIX) 100 UNIT/ML PEN: 15.0000 [IU] | PEN_INJECTOR | Freq: Every day | SUBCUTANEOUS | 11 refills | Status: DC

## 2023-04-27 MED ORDER — TRIAMCINOLONE ACETONIDE 0.1 % EX CREA
1.0000 | TOPICAL_CREAM | Freq: Two times a day (BID) | CUTANEOUS | 0 refills | Status: AC
Start: 2023-04-27 — End: 2023-05-05

## 2023-04-27 NOTE — Progress Notes (Signed)
Location:   [facility] Room Number: [id number] Place of Service:  ALF [phone]) Provider:  Hazle Nordmann, NP  Creola Corn, MD  Patient Care Team: Creola Corn, MD as PCP - General (Internal Medicine) Abigail Miyamoto, MD as Consulting Physician (General Surgery) Rachel Moulds, MD as Consulting Physician (Hematology and Oncology) Dorothy Puffer, MD as Consulting Physician (Radiation Oncology) Pershing Proud, RN as Oncology Nurse Navigator Donnelly Angelica, RN as Oncology Nurse Navigator  Extended Emergency Contact Information Primary Emergency Contact: [name] Address: [address]          [place], [place] [address] Darden Amber of [place] Home Phone: [phone] Mobile Phone: [phone] Relation: Son  Code Status:  DNR Goals of care: Advanced Directive information    04/27/2023   11:02 AM  Advanced Directives  Does Patient Have a Medical Advance Directive? Yes  Type of Advance Directive Out of facility DNR (pink MOST or yellow form)  Does patient want to make changes to medical advance directive? No - Patient declined  Pre-existing out of facility DNR order (yellow form or pink MOST form) Yellow form placed in chart (order not valid for inpatient use)     Chief Complaint  Patient presents with   Acute Visit    Rash    HPI:  Pt is a 89 y.o. female seen today for an acute visit due to rash on chest.   She currently resides on the assisted living unit at [facility]. PMH: HTN, tachycardia, cardiomyopathy, pacemaker, diverticulitis, T2DM, osteopenia, closed pelvic fracture, DCIS right breast> s/p lumpectomy and completed radiation x 5 treatments, and anemia.   06/22 she noticed rash to chest with increased itching. She tried applying neosporin ointment without success. Denies changes with detergents, soaps or lotions. She is also avoiding hot weather outside. 06/20 was last radiation treatment. She is unsure if treatment caused rash. She  did not have rash after previous treatments. Afebrile. Vitals stable.      Past Medical History:  Diagnosis Date   Biventricular ICD (implantable cardiac defibrillator) in place    downgraded from CRT-D to CRT-P 08/30/22   Breast cancer (HCC)    Cellulitis    Change in bowel habits    CHF (congestive heart failure) (HCC)    Colon polyps    Diabetes mellitus    Type II   Diarrhea    Dyslipidemia    HOH (hard of hearing)    left   HTN (hypertension)    LBBB (left bundle branch block)    chronic   Meniere's disease    Nonischemic cardiomyopathy (HCC) 2006   Presence of permanent cardiac pacemaker    T wave oversensing 01/17/2014   Ulcerative colitis    Varicose veins    4 lazer  treatment each leg   Past Surgical History:  Procedure Laterality Date   ABDOMINAL HYSTERECTOMY     BIV PACEMAKER GENERATOR CHANGEOUT N/A 08/30/2022   Procedure: BIV PACEMAKER GENERATOR CHANGEOUT;  Surgeon: Duke Salvia, MD;  Location: [facility] INVASIVE CV LAB;  Service: Cardiovascular;  Laterality: N/A;   BREAST LUMPECTOMY WITH RADIOACTIVE SEED LOCALIZATION Right 01/05/2023   Procedure: RIGHT BREAST BRACKETED LUMPECTOMY WITH RADIOACTIVE SEED LOCALIZATION;  Surgeon: Abigail Miyamoto, MD;  Location[facility] OR;  Service: General;  Laterality: Right;   CARDIAC CATHETERIZATION  08/2011   CHOLECYSTECTOMY     COLONOSCOPY     EP Study  08/22/08   IMPLANTABLE CARDIOVERTER DEFIBRILLATOR GENERATOR CHANGE N/A 08/22/2013   Procedure:  IMPLANTABLE CARDIOVERTER DEFIBRILLATOR GENERATOR CHANGE;  Surgeon: Duke Salvia, MD;  Location: [facility] CATH LAB;  Service: Cardiovascular;  Laterality: N/A;   KNEE ARTHROSCOPY Left 04/14/2016   Procedure: LEFT KNEE ARTHROSCOPY, PARTIAL MEDIAL AND PARTIAL LATERAL MENISCECTOMY, MEDIAL PLICA;  Surgeon: Jodi Geralds, MD;  Location: [facility] OR;  Service: Orthopedics;  Laterality: Left;   mastoid sugery     in the setting of Meniere's disease   TONSILLECTOMY AND ADENOIDECTOMY      Allergies   Allergen Reactions   Carvedilol Other (See Comments)    Pt does not remember reaction  Other Reaction(s): Unknown   Digoxin Other (See Comments)    Dig toxicity   Vancomycin Other (See Comments)    ("Red Man Syndrome") Pt states she turned red and was itching  Unknown  ("Red Man Syndrome") Pt states she turned red and was itching    Unknown    ("Red Man Syndrome") Pt states she turned red and was itching   Pseudoephedrine Other (See Comments)    Makes skin feel like its crawling  Makes skin feel like its crawling Unknown Makes skin feel like its crawling  Makes skin feel like its crawling Unknown  Makes skin feel like its crawling    Makes skin feel like its crawling Unknown Makes skin feel like its crawling    Makes skin feel like its crawling, Unknown    Makes skin feel like its crawling Unknown    Makes skin feel like its crawling    Allergies as of 04/27/2023       Reactions   Carvedilol Other (See Comments)   Pt does not remember reaction Other Reaction(s): Unknown   Digoxin Other (See Comments)   Dig toxicity   Vancomycin Other (See Comments)   ("Red Man Syndrome") Pt states she turned red and was itching Unknown  ("Red Man Syndrome") Pt states she turned red and was itching    Unknown    ("Red Man Syndrome") Pt states she turned red and was itching   Pseudoephedrine Other (See Comments)   Makes skin feel like its crawling Makes skin feel like its crawling Unknown Makes skin feel like its crawling  Makes skin feel like its crawling Unknown  Makes skin feel like its crawling    Makes skin feel like its crawling Unknown Makes skin feel like its crawling    Makes skin feel like its crawling, Unknown    Makes skin feel like its crawling Unknown    Makes skin feel like its crawling        Medication List        Accurate as of April 27, 2023 11:23 AM. If you have any questions, ask your nurse or doctor.          STOP taking these medications     oxyCODONE 5 MG immediate release tablet Commonly known as: Roxicodone Stopped by: Octavia Heir, NP   traMADol 50 MG tablet Commonly known as: ULTRAM Stopped by: Octavia Heir, NP       TAKE these medications    acetaminophen 500 MG tablet Commonly known as: TYLENOL Take 500 mg by mouth every 8 (eight) hours as needed for headache.   aspirin EC 81 MG tablet Take 81 mg by mouth daily. Swallow whole.   atorvastatin 40 MG tablet Commonly known as: LIPITOR Take 40 mg by mouth daily.   Calcium 600 600 MG Tabs tablet Generic drug: calcium carbonate Take 600 mg by mouth 2 (two) times  daily with a meal.   Cranberry 500 MG Caps Take 2 tablets by mouth daily.   dicyclomine 10 MG capsule Commonly known as: BENTYL TAKE 1 CAPSULE BY MOUTH THREE TIMES DAILY 30 MINUTES BEFORE MEALS   furosemide 20 MG tablet Commonly known as: LASIX Take 1 tablet (20 mg total) by mouth daily. alternating (40 mg) every other day.   insulin aspart protamine - aspart (70-30) 100 UNIT/ML FlexPen Commonly known as: NOVOLOG 70/30 MIX Inject 32 Units into the skin 2 (two) times daily with a meal.   loperamide 2 MG tablet Commonly known as: IMODIUM A-D Take 2 mg by mouth as needed for diarrhea or loose stools.   metoprolol succinate 100 MG 24 hr tablet Commonly known as: TOPROL-XL Take 100 mg by mouth 2 (two) times daily.   olmesartan 40 MG tablet Commonly known as: BENICAR Take 40 mg by mouth daily.   potassium chloride 10 MEQ tablet Commonly known as: KLOR-CON M Take 10 mEq by mouth 2 (two) times daily.   vitamin D3 50 MCG (2000 UT) Caps Take 1 capsule by mouth daily in the afternoon. What changed: Another medication with the same name was removed. Continue taking this medication, and follow the directions you see here. Changed by: Octavia Heir, NP        Review of Systems  Respiratory:  Negative for cough, shortness of breath and wheezing.   Cardiovascular:  Negative for chest pain and  leg swelling.  Gastrointestinal: Negative.   Genitourinary: Negative.   Musculoskeletal: Negative.   Skin:  Positive for rash.  Neurological: Negative.   Hematological: Negative.   Psychiatric/Behavioral: Negative.      Immunization History  Administered Date(s) Administered   Influenza, High Dose Seasonal PF 10/06/2016, 10/05/2017, 09/14/2018, 08/17/2022   Influenza,inj,Quad PF,6+ Mos 10/02/2014   PNEUMOCOCCAL CONJUGATE-20 03/16/2023   PPD Test 02/22/2023   Pneumococcal-Unspecified 03/29/2022   Tdap 09/29/2022   Unspecified SARS-COV-2 Vaccination 11/05/2019, 12/03/2019, 03/31/2021, 07/21/2021, 03/19/2022   Zoster Recombinat (Shingrix) 03/07/2023   Pertinent  Health Maintenance Due  Topic Date Due   FOOT EXAM  Never done   OPHTHALMOLOGY EXAM  Never done   INFLUENZA VACCINE  06/02/2023   HEMOGLOBIN A1C  08/27/2023   DEXA SCAN  Completed      03/02/2022    1:19 PM 08/16/2022   11:20 AM 08/30/2022    8:22 AM 09/29/2022    3:01 PM 03/07/2023    9:32 AM  Fall Risk  Falls in the past year? 1 0   1  Was there an injury with Fall? 1    1  Fall Risk Category Calculator 3    3  Fall Risk Category (Retired) High      (RETIRED) Patient Fall Risk Level Low fall risk Low fall risk Low fall risk Moderate fall risk   Patient at Risk for Falls Due to     History of fall(s);Impaired balance/gait  Fall risk Follow up  Falls evaluation completed   Falls evaluation completed   Functional Status Survey:    Vitals:   04/27/23 1100  BP: (!) 144/66  Pulse: 70  Resp: 18  Temp: (!) 96.7 F (35.9 C)  SpO2: 94%  Weight: 130 lb 12.8 oz (59.3 kg)  Height: 5\' 6"  (1.676 m)   Body mass index is 21.11 kg/m. Physical Exam Vitals reviewed.  Constitutional:      General: She is not in acute distress. HENT:     Head: Normocephalic.  Eyes:  General:        Right eye: No discharge.        Left eye: No discharge.  Cardiovascular:     Rate and Rhythm: Normal rate and regular rhythm.      Pulses: Normal pulses.     Heart sounds: Normal heart sounds.  Pulmonary:     Effort: Pulmonary effort is normal.     Breath sounds: Normal breath sounds.  Skin:    General: Skin is warm.     Findings: Erythema and rash present.     Comments: Maculopapular rash over sternum and near breast folds, no hives, rash localized and does not radiate to other areas on body, no scratch marks or skin breakdown.   Neurological:     General: No focal deficit present.     Mental Status: She is alert and oriented to person, place, and time.  Psychiatric:        Mood and Affect: Mood normal.     Labs reviewed: Recent Labs    08/02/22 1559 12/08/22 0801 12/31/22 1430 02/25/23 0000  NA 144 140 140 143  K 3.8 3.3* 3.6 3.7  CL 100 102 101 104  CO2 26 27 26  29*  GLUCOSE 124* 185* 82  --   BUN 15 15 13 17   CREATININE 0.89 1.02* 0.94 0.8  CALCIUM 9.2 9.0 10.0 9.0   Recent Labs    12/08/22 0801 02/25/23 0000  AST 19 16  ALT 13 10  ALKPHOS 67 62  BILITOT 0.8  --   PROT 6.6  --   ALBUMIN 4.0 3.8   Recent Labs    08/02/22 1559 12/08/22 0801 12/31/22 1430 02/25/23 0000  WBC 7.2 5.9 7.0 5.3  NEUTROABS  --  4.4  --  3,546.00  HGB 13.8 14.5 14.7 12.9  HCT 40.3 42.2 43.0 38  MCV 95 93.6 95.3  --   PLT 145* 153 178 179   Lab Results  Component Value Date   TSH 1.93 02/25/2023   Lab Results  Component Value Date   HGBA1C 7.5 02/25/2023   Lab Results  Component Value Date   CHOL 116 02/25/2023   HDL 35 02/25/2023   LDLCALC 58 02/25/2023   TRIG 144 02/25/2023   CHOLHDL 2.7 11/10/2020    Significant Diagnostic Results in last 30 days:  No results found.  Assessment/Plan 1. Rash and nonspecific skin eruption - onset 06/22 - tried neosporin without success - 06/20 last radiation treatment for right breast cancer - maculopapular rash to sternum and breast folds - start diflucan 150 mg po once - start triamcinolone 0.1% cream> apply BID x 7 days, then BID prn x1 month -  triamcinolone cream (KENALOG) 0.1 %; Apply 1 Application topically 2 (two) times daily for 8 days.  Dispense: 15 g; Refill: 0    Family/ staff Communication: plan discussed with patient and nurse  Labs/tests ordered:  none

## 2023-04-28 DIAGNOSIS — R2689 Other abnormalities of gait and mobility: Secondary | ICD-10-CM | POA: Diagnosis not present

## 2023-04-28 DIAGNOSIS — M62511 Muscle wasting and atrophy, not elsewhere classified, right shoulder: Secondary | ICD-10-CM | POA: Diagnosis not present

## 2023-04-28 DIAGNOSIS — M5459 Other low back pain: Secondary | ICD-10-CM | POA: Diagnosis not present

## 2023-04-28 DIAGNOSIS — M6281 Muscle weakness (generalized): Secondary | ICD-10-CM | POA: Diagnosis not present

## 2023-04-28 DIAGNOSIS — R278 Other lack of coordination: Secondary | ICD-10-CM | POA: Diagnosis not present

## 2023-04-28 DIAGNOSIS — R2681 Unsteadiness on feet: Secondary | ICD-10-CM | POA: Diagnosis not present

## 2023-04-28 DIAGNOSIS — M62512 Muscle wasting and atrophy, not elsewhere classified, left shoulder: Secondary | ICD-10-CM | POA: Diagnosis not present

## 2023-04-28 DIAGNOSIS — R41841 Cognitive communication deficit: Secondary | ICD-10-CM | POA: Diagnosis not present

## 2023-04-29 DIAGNOSIS — M5459 Other low back pain: Secondary | ICD-10-CM | POA: Diagnosis not present

## 2023-04-29 DIAGNOSIS — M6281 Muscle weakness (generalized): Secondary | ICD-10-CM | POA: Diagnosis not present

## 2023-04-29 DIAGNOSIS — R2681 Unsteadiness on feet: Secondary | ICD-10-CM | POA: Diagnosis not present

## 2023-04-29 DIAGNOSIS — R41841 Cognitive communication deficit: Secondary | ICD-10-CM | POA: Diagnosis not present

## 2023-04-29 DIAGNOSIS — M62511 Muscle wasting and atrophy, not elsewhere classified, right shoulder: Secondary | ICD-10-CM | POA: Diagnosis not present

## 2023-04-29 DIAGNOSIS — M62512 Muscle wasting and atrophy, not elsewhere classified, left shoulder: Secondary | ICD-10-CM | POA: Diagnosis not present

## 2023-04-29 DIAGNOSIS — R278 Other lack of coordination: Secondary | ICD-10-CM | POA: Diagnosis not present

## 2023-04-29 DIAGNOSIS — R2689 Other abnormalities of gait and mobility: Secondary | ICD-10-CM | POA: Diagnosis not present

## 2023-05-02 ENCOUNTER — Encounter: Payer: Self-pay | Admitting: Nurse Practitioner

## 2023-05-02 ENCOUNTER — Non-Acute Institutional Stay: Payer: Medicare Other | Admitting: Nurse Practitioner

## 2023-05-02 DIAGNOSIS — Z794 Long term (current) use of insulin: Secondary | ICD-10-CM

## 2023-05-02 DIAGNOSIS — K5732 Diverticulitis of large intestine without perforation or abscess without bleeding: Secondary | ICD-10-CM | POA: Diagnosis not present

## 2023-05-02 DIAGNOSIS — N183 Chronic kidney disease, stage 3 unspecified: Secondary | ICD-10-CM

## 2023-05-02 DIAGNOSIS — E1122 Type 2 diabetes mellitus with diabetic chronic kidney disease: Secondary | ICD-10-CM

## 2023-05-02 DIAGNOSIS — R29898 Other symptoms and signs involving the musculoskeletal system: Secondary | ICD-10-CM | POA: Diagnosis not present

## 2023-05-02 DIAGNOSIS — Q6211 Congenital occlusion of ureteropelvic junction: Secondary | ICD-10-CM

## 2023-05-02 DIAGNOSIS — I5022 Chronic systolic (congestive) heart failure: Secondary | ICD-10-CM

## 2023-05-02 DIAGNOSIS — E1169 Type 2 diabetes mellitus with other specified complication: Secondary | ICD-10-CM

## 2023-05-02 DIAGNOSIS — R278 Other lack of coordination: Secondary | ICD-10-CM | POA: Diagnosis not present

## 2023-05-02 DIAGNOSIS — L589 Radiodermatitis, unspecified: Secondary | ICD-10-CM

## 2023-05-02 DIAGNOSIS — E559 Vitamin D deficiency, unspecified: Secondary | ICD-10-CM

## 2023-05-02 DIAGNOSIS — I10 Essential (primary) hypertension: Secondary | ICD-10-CM

## 2023-05-02 DIAGNOSIS — M1990 Unspecified osteoarthritis, unspecified site: Secondary | ICD-10-CM

## 2023-05-02 DIAGNOSIS — I700 Atherosclerosis of aorta: Secondary | ICD-10-CM

## 2023-05-02 DIAGNOSIS — E538 Deficiency of other specified B group vitamins: Secondary | ICD-10-CM

## 2023-05-02 DIAGNOSIS — M6281 Muscle weakness (generalized): Secondary | ICD-10-CM | POA: Diagnosis not present

## 2023-05-02 DIAGNOSIS — R41841 Cognitive communication deficit: Secondary | ICD-10-CM | POA: Diagnosis not present

## 2023-05-02 DIAGNOSIS — M8580 Other specified disorders of bone density and structure, unspecified site: Secondary | ICD-10-CM

## 2023-05-02 DIAGNOSIS — E785 Hyperlipidemia, unspecified: Secondary | ICD-10-CM

## 2023-05-02 DIAGNOSIS — Z780 Asymptomatic menopausal state: Secondary | ICD-10-CM

## 2023-05-02 DIAGNOSIS — R2681 Unsteadiness on feet: Secondary | ICD-10-CM | POA: Diagnosis not present

## 2023-05-02 NOTE — Progress Notes (Signed)
Location:  [facility] Room Number: AL/826/[id number] Place of Service:  ALF ([phone]) Provider:  Lakaisha Danish X, NP Creola Corn, MD  Patient Care Team: Creola Corn, MD as PCP - General (Internal Medicine) Abigail Miyamoto, MD as Consulting Physician (General Surgery) Rachel Moulds, MD as Consulting Physician (Hematology and Oncology) Dorothy Puffer, MD as Consulting Physician (Radiation Oncology) Pershing Proud, RN as Oncology Nurse Navigator Donnelly Angelica, RN as Oncology Nurse Navigator  Extended Emergency Contact Information Primary Emergency Contact: [name] Address: [address]          [place], [place] [address] [place] of [place] Home Phone: [phone] Mobile Phone: [phone] Relation: Son  Code Status:  DNR Goals of care: Advanced Directive information    04/27/2023   11:02 AM  Advanced Directives  Does Patient Have a Medical Advance Directive? Yes  Type of Advance Directive Out of facility DNR (pink MOST or yellow form)  Does patient want to make changes to medical advance directive? No - Patient declined  Pre-existing out of facility DNR order (yellow form or pink MOST form) Yellow form placed in chart (order not valid for inpatient use)     Chief Complaint  Patient presents with  . Acute Visit    Patient is being seen for medication review Elevated BP need 2nd reading for care gap metric     HPI:  Pt is a 89 y.o. female seen today for an acute visit for    Past Medical History:  Diagnosis Date  . Biventricular ICD (implantable cardiac defibrillator) in place    downgraded from CRT-D to CRT-P 08/30/22  . Breast cancer (HCC)   . Cellulitis   . Change in bowel habits   . CHF (congestive heart failure) (HCC)   . Colon polyps   . Diabetes mellitus    Type II  . Diarrhea   . Dyslipidemia   . HOH (hard of hearing)    left  . HTN (hypertension)   . LBBB (left bundle branch block)    chronic  . Meniere's disease    . Nonischemic cardiomyopathy (HCC) 2006  . Presence of permanent cardiac pacemaker   . T wave oversensing 01/17/2014  . Ulcerative colitis   . Varicose veins    4 lazer  treatment each leg   Past Surgical History:  Procedure Laterality Date  . ABDOMINAL HYSTERECTOMY    . BIV PACEMAKER GENERATOR CHANGEOUT N/A 08/30/2022   Procedure: BIV PACEMAKER GENERATOR CHANGEOUT;  Surgeon: Duke Salvia, MD;  Location: [facility] INVASIVE CV LAB;  Service: Cardiovascular;  Laterality: N/A;  . BREAST LUMPECTOMY WITH RADIOACTIVE SEED LOCALIZATION Right 01/05/2023   Procedure: RIGHT BREAST BRACKETED LUMPECTOMY WITH RADIOACTIVE SEED LOCALIZATION;  Surgeon: Abigail Miyamoto, MD;  Location: [facility] OR;  Service: General;  Laterality: Right;  . CARDIAC CATHETERIZATION  08/2011  . CHOLECYSTECTOMY    . COLONOSCOPY    . EP Study  08/22/08  . IMPLANTABLE CARDIOVERTER DEFIBRILLATOR GENERATOR CHANGE N/A 08/22/2013   Procedure: IMPLANTABLE CARDIOVERTER DEFIBRILLATOR GENERATOR CHANGE;  Surgeon: Duke Salvia, MD;  Location: [facility] CATH LAB;  Service: Cardiovascular;  Laterality: N/A;  . KNEE ARTHROSCOPY Left 04/14/2016   Procedure: LEFT KNEE ARTHROSCOPY, PARTIAL MEDIAL AND PARTIAL LATERAL MENISCECTOMY, MEDIAL PLICA;  Surgeon: Jodi Geralds, MD;  Location: [facility] OR;  Service: Orthopedics;  Laterality: Left;  . mastoid sugery     in the setting of Meniere's disease  . TONSILLECTOMY AND ADENOIDECTOMY      Allergies  Allergen  Reactions  . Carvedilol Other (See Comments)    Pt does not remember reaction  Other Reaction(s): Unknown  . Digoxin Other (See Comments)    Dig toxicity  . Vancomycin Other (See Comments)    ("Red Bita Cartwright Syndrome") Pt states she turned red and was itching  Unknown  ("Red Vestal Crandall Syndrome") Pt states she turned red and was itching    Unknown    ("Red Issai Werling Syndrome") Pt states she turned red and was itching  . Pseudoephedrine Other (See Comments)    Makes skin feel like its crawling  Makes skin feel like its  crawling Unknown Makes skin feel like its crawling  Makes skin feel like its crawling Unknown  Makes skin feel like its crawling    Makes skin feel like its crawling Unknown Makes skin feel like its crawling    Makes skin feel like its crawling, Unknown    Makes skin feel like its crawling Unknown    Makes skin feel like its crawling    Outpatient Encounter Medications as of 05/02/2023  Medication Sig  . acetaminophen (TYLENOL) 500 MG tablet Take 500 mg by mouth every 8 (eight) hours as needed for headache.  Marland Kitchen aspirin EC 81 MG tablet Take 81 mg by mouth daily. Swallow whole.  Marland Kitchen atorvastatin (LIPITOR) 40 MG tablet Take 40 mg by mouth daily.  . calcium carbonate (CALCIUM 600) 600 MG TABS tablet Take 600 mg by mouth 2 (two) times daily with a meal.  . Cholecalciferol (VITAMIN D3) 50 MCG (2000 UT) CAPS Take 1 capsule by mouth daily in the afternoon.  . Cranberry 500 MG CAPS Take 2 tablets by mouth daily.  Marland Kitchen dicyclomine (BENTYL) 10 MG capsule TAKE 1 CAPSULE BY MOUTH THREE TIMES DAILY 30 MINUTES BEFORE MEALS  . furosemide (LASIX) 20 MG tablet Take 1 tablet (20 mg total) by mouth daily. alternating (40 mg) every other day.  . insulin aspart protamine - aspart (NOVOLOG 70/30 MIX) (70-30) 100 UNIT/ML FlexPen Inject 32 Units into the skin daily with breakfast. Give if blood sugar > 151  . insulin aspart protamine - aspart (NOVOLOG 70/30 MIX) (70-30) 100 UNIT/ML FlexPen Inject 15 Units into the skin daily with lunch. Give if blood sugar > 151  . insulin aspart protamine - aspart (NOVOLOG 70/30 MIX) (70-30) 100 UNIT/ML FlexPen Inject 28 Units into the skin daily with supper. Give if blood sugar > 151  . loperamide (IMODIUM A-D) 2 MG tablet Take 2 mg by mouth as needed for diarrhea or loose stools.  . metoprolol succinate (TOPROL-XL) 100 MG 24 hr tablet Take 100 mg by mouth 2 (two) times daily.  Marland Kitchen olmesartan (BENICAR) 40 MG tablet Take 40 mg by mouth daily.  . potassium chloride (K-DUR) 10 MEQ tablet  Take 10 mEq by mouth 2 (two) times daily.  Marland Kitchen triamcinolone cream (KENALOG) 0.1 % Apply 1 Application topically 2 (two) times daily for 8 days.   No facility-administered encounter medications on file as of 05/02/2023.    Review of Systems  Immunization History  Administered Date(s) Administered  . Influenza, High Dose Seasonal PF 10/06/2016, 10/05/2017, 09/14/2018, 08/17/2022  . Influenza,inj,Quad PF,6+ Mos 10/02/2014  . PNEUMOCOCCAL CONJUGATE-20 03/16/2023  . PPD Test 02/22/2023  . Pneumococcal-Unspecified 03/29/2022  . Tdap 09/29/2022  . Unspecified SARS-COV-2 Vaccination 11/05/2019, 12/03/2019, 03/31/2021, 07/21/2021, 03/19/2022  . Zoster Recombinant(Shingrix) 03/07/2023   Pertinent  Health Maintenance Due  Topic Date Due  . FOOT EXAM  Never done  . OPHTHALMOLOGY EXAM  Never done  .  INFLUENZA VACCINE  06/02/2023  . HEMOGLOBIN A1C  08/27/2023  . DEXA SCAN  Completed      03/02/2022    1:19 PM 08/16/2022   11:20 AM 08/30/2022    8:22 AM 09/29/2022    3:01 PM 03/07/2023    9:32 AM  Fall Risk  Falls in the past year? 1 0   1  Was there an injury with Fall? 1    1  Fall Risk Category Calculator 3    3  Fall Risk Category (Retired) High      (RETIRED) Patient Fall Risk Level Low fall risk Low fall risk Low fall risk Moderate fall risk   Patient at Risk for Falls Due to     History of fall(s);Impaired balance/gait  Fall risk Follow up  Falls evaluation completed   Falls evaluation completed   Functional Status Survey:    Vitals:   05/02/23 1140  BP: (!) 144/66  Pulse: 70  Resp: 18  Temp: (!) 96.7 F (35.9 C)  SpO2: 94%  Weight: 130 lb 12.8 oz (59.3 kg)  Height: 5\' 6"  (1.676 m)   Body mass index is 21.11 kg/m. Physical Exam  Labs reviewed: Recent Labs    08/02/22 1559 12/08/22 0801 12/31/22 1430 02/25/23 0000  NA 144 140 140 143  K 3.8 3.3* 3.6 3.7  CL 100 102 101 104  CO2 26 27 26  29*  GLUCOSE 124* 185* 82  --   BUN 15 15 13 17   CREATININE 0.89 1.02*  0.94 0.8  CALCIUM 9.2 9.0 10.0 9.0   Recent Labs    12/08/22 0801 02/25/23 0000  AST 19 16  ALT 13 10  ALKPHOS 67 62  BILITOT 0.8  --   PROT 6.6  --   ALBUMIN 4.0 3.8   Recent Labs    08/02/22 1559 12/08/22 0801 12/31/22 1430 02/25/23 0000  WBC 7.2 5.9 7.0 5.3  NEUTROABS  --  4.4  --  3,546.00  HGB 13.8 14.5 14.7 12.9  HCT 40.3 42.2 43.0 38  MCV 95 93.6 95.3  --   PLT 145* 153 178 179   Lab Results  Component Value Date   TSH 1.93 02/25/2023   Lab Results  Component Value Date   HGBA1C 7.5 02/25/2023   Lab Results  Component Value Date   CHOL 116 02/25/2023   HDL 35 02/25/2023   LDLCALC 58 02/25/2023   TRIG 144 02/25/2023   CHOLHDL 2.7 11/10/2020    Significant Diagnostic Results in last 30 days:  No results found.  Assessment/Plan There are no diagnoses linked to this encounter.   Family/ staff Communication: ***  Labs/tests ordered:  ***

## 2023-05-03 ENCOUNTER — Inpatient Hospital Stay (HOSPITAL_BASED_OUTPATIENT_CLINIC_OR_DEPARTMENT_OTHER): Payer: Medicare Other | Admitting: Adult Health

## 2023-05-03 ENCOUNTER — Telehealth: Payer: Self-pay

## 2023-05-03 DIAGNOSIS — R296 Repeated falls: Secondary | ICD-10-CM | POA: Diagnosis not present

## 2023-05-03 DIAGNOSIS — Z8673 Personal history of transient ischemic attack (TIA), and cerebral infarction without residual deficits: Secondary | ICD-10-CM | POA: Diagnosis not present

## 2023-05-03 DIAGNOSIS — M1990 Unspecified osteoarthritis, unspecified site: Secondary | ICD-10-CM | POA: Insufficient documentation

## 2023-05-03 DIAGNOSIS — L589 Radiodermatitis, unspecified: Secondary | ICD-10-CM | POA: Insufficient documentation

## 2023-05-03 DIAGNOSIS — D0511 Intraductal carcinoma in situ of right breast: Secondary | ICD-10-CM

## 2023-05-03 DIAGNOSIS — R2681 Unsteadiness on feet: Secondary | ICD-10-CM | POA: Diagnosis not present

## 2023-05-03 DIAGNOSIS — E11649 Type 2 diabetes mellitus with hypoglycemia without coma: Secondary | ICD-10-CM | POA: Diagnosis not present

## 2023-05-03 NOTE — Telephone Encounter (Signed)
Grace Robinson with [employer] equipment [employer](regarding order received from Dr. Hyacinth Meeker for rollator) called stating that they have several attempts to reach patient at the contact information that was provided to them with no success. He says they have tried calling patient's main contact number as well as daughter's number. I informed him that we did not have the daughter's number on file,but did have a number for patient's son. He was given son's contact information and states that he will try to reach out to him.

## 2023-05-03 NOTE — Assessment & Plan Note (Signed)
taking Atorvastatin, LDL 58 02/24/23 

## 2023-05-03 NOTE — Assessment & Plan Note (Signed)
taking ASA, Atorvastatin 

## 2023-05-03 NOTE — Assessment & Plan Note (Signed)
Healed multiple fractures sustained from fall 02/18/23: pelvic fractures: nondisplaced fxs of R superior and inferior pubic rami, minimally displaced fx of the inferior R sacral ala extended to the R SIJ. Mild compression fx of superior endplates of T12, L2. Prn Tylenol is adequate for pain, ambulates with walker.

## 2023-05-03 NOTE — Assessment & Plan Note (Signed)
Compensated clinically,  taking Furosemide, Bun/creat 17/0.8 02/24/23 

## 2023-05-03 NOTE — Progress Notes (Signed)
Called patient on preferred # x 2 for telephone SCP visit.  There was no answer and no voice mail to leave a message.    Will attempt to reschedule, and will have our administrative assistant send Diamondnique her SCP in the mail.    Lillard Anes, NP 05/03/23 3:04 PM Medical Oncology and Hematology [facility] [address] [place], [place] [address] Tel. [phone]    Fax. [phone]

## 2023-05-03 NOTE — Assessment & Plan Note (Signed)
Blood pressure is controlled,  taking Metoprolol, Olmesartan 

## 2023-05-03 NOTE — Assessment & Plan Note (Signed)
no stones 

## 2023-05-03 NOTE — Assessment & Plan Note (Signed)
03/03/23 DEXA t score -1.236, on Ca, Vit D, recommend Fosamax 70mg qwk vs Evenity 210mg Sanderson qmo x12 mos.  

## 2023-05-03 NOTE — Assessment & Plan Note (Signed)
takes Vit B12, Hgb 12.9 02/24/23 

## 2023-05-03 NOTE — Assessment & Plan Note (Signed)
S/p R breat cancer lumpectomy, f/u surgeon, 03/15/23 cancer center radiation tx  Radiation dermatitis right upper chest/breast, no s/s of infection.

## 2023-05-03 NOTE — Assessment & Plan Note (Signed)
taking insulin 70/30, 03/04/23 hypoglycemic episode(CBG 47) in pm, stabilized after Glucagon 1mg IM, reduced Aspart 70/30 to 28u/32u, continued am 32 u, prn noon, Hgb A1c 7.5 02/24/23 

## 2023-05-03 NOTE — Assessment & Plan Note (Signed)
on Vit D, Vit D 41 02/24/23 

## 2023-05-04 ENCOUNTER — Encounter: Payer: Self-pay | Admitting: Adult Health

## 2023-05-04 ENCOUNTER — Non-Acute Institutional Stay: Payer: Medicare Other | Admitting: Adult Health

## 2023-05-04 DIAGNOSIS — K589 Irritable bowel syndrome without diarrhea: Secondary | ICD-10-CM

## 2023-05-04 DIAGNOSIS — M6281 Muscle weakness (generalized): Secondary | ICD-10-CM | POA: Diagnosis not present

## 2023-05-04 DIAGNOSIS — R2681 Unsteadiness on feet: Secondary | ICD-10-CM | POA: Diagnosis not present

## 2023-05-04 DIAGNOSIS — R29898 Other symptoms and signs involving the musculoskeletal system: Secondary | ICD-10-CM | POA: Diagnosis not present

## 2023-05-04 DIAGNOSIS — I10 Essential (primary) hypertension: Secondary | ICD-10-CM | POA: Diagnosis not present

## 2023-05-04 DIAGNOSIS — R41841 Cognitive communication deficit: Secondary | ICD-10-CM | POA: Diagnosis not present

## 2023-05-04 DIAGNOSIS — Z794 Long term (current) use of insulin: Secondary | ICD-10-CM

## 2023-05-04 DIAGNOSIS — S3282XD Multiple fractures of pelvis without disruption of pelvic ring, subsequent encounter for fracture with routine healing: Secondary | ICD-10-CM | POA: Diagnosis not present

## 2023-05-04 DIAGNOSIS — I5022 Chronic systolic (congestive) heart failure: Secondary | ICD-10-CM

## 2023-05-04 DIAGNOSIS — E1169 Type 2 diabetes mellitus with other specified complication: Secondary | ICD-10-CM

## 2023-05-04 DIAGNOSIS — R278 Other lack of coordination: Secondary | ICD-10-CM | POA: Diagnosis not present

## 2023-05-04 MED ORDER — INSULIN ASPART PROT & ASPART (70-30 MIX) 100 UNIT/ML PEN
25.0000 [IU] | PEN_INJECTOR | Freq: Three times a day (TID) | SUBCUTANEOUS | 3 refills | Status: DC
Start: 2023-05-04 — End: 2023-07-08

## 2023-05-04 MED ORDER — INSULIN ASPART PROT & ASPART (70-30 MIX) 100 UNIT/ML PEN
25.0000 [IU] | PEN_INJECTOR | Freq: Three times a day (TID) | SUBCUTANEOUS | 3 refills | Status: DC
Start: 2023-05-04 — End: 2023-05-04

## 2023-05-04 NOTE — Progress Notes (Signed)
Location:  [facility] Room Number: 826 A Place of Service:  ALF [phone]) Provider:  Kenard Gower, DNP, FNP-BC  Patient Care Team: Creola Corn, MD as PCP - General (Internal Medicine) Abigail Miyamoto, MD as Consulting Physician (General Surgery) Rachel Moulds, MD as Consulting Physician (Hematology and Oncology) Dorothy Puffer, MD as Consulting Physician (Radiation Oncology)  Extended Emergency Contact Information Primary Emergency Contact: [name] Address: [address]          [place], [place] [address] D[place] of [place] Home Phone: [phone] Mobile Phone: [phone] Relation: Son  Code Status:  DNR  Goals of care: Advanced Directive information    05/04/2023   11:23 AM  Advanced Directives  Does Patient Have a Medical Advance Directive? Yes  Type of Advance Directive Out of facility DNR (pink MOST or yellow form)  Does patient want to make changes to medical advance directive? No - Patient declined  Would patient like information on creating a medical advance directive? No - Patient declined  Pre-existing out of facility DNR order (yellow form or pink MOST form) Yellow form placed in chart (order not valid for inpatient use)     Chief Complaint  Patient presents with   Acute Visit    Diabetes management    HPI:  Pt is a 89 y.o. female seen today for an acute visit regarding diabetes management. She was supposed to be discharged from ALF back to her independent apartment. Discharged to the apartment was held due to confusion with her diabetes management. She was reported to have a hard time understanding the sliding scale for insulin and the use of her glucometer. CBG this morning was 138 and no insulin was given.   She was admitted to [facility] for short-term rehabilitation following multiple fractures sustained from fall 02/18/23. She was found to have pelvic fractures: nondisplaced fractures of right  superior and inferior pubic rami, minimally displaced fracture of the inferior right sacral ala extended to the right SIJ. Mild compression fracture of superior endplates of T12, L2.   Past Medical History:  Diagnosis Date   Biventricular ICD (implantable cardiac defibrillator) in place    downgraded from CRT-D to CRT-P 08/30/22   Breast cancer (HCC)    Cellulitis    Change in bowel habits    CHF (congestive heart failure) (HCC)    Colon polyps    Diabetes mellitus    Type II   Diarrhea    Dyslipidemia    HOH (hard of hearing)    left   HTN (hypertension)    LBBB (left bundle branch block)    chronic   Meniere's disease    Nonischemic cardiomyopathy (HCC) 2006   Presence of permanent cardiac pacemaker    T wave oversensing 01/17/2014   Ulcerative colitis    Varicose veins    4 lazer  treatment each leg   Past Surgical History:  Procedure Laterality Date   ABDOMINAL HYSTERECTOMY     BIV PACEMAKER GENERATOR CHANGEOUT N/A 08/30/2022   Procedure: BIV PACEMAKER GENERATOR CHANGEOUT;  Surgeon: Duke Salvia, MD;  Location: [facility] INVASIVE CV LAB;  Service: Cardiovascular;  Laterality: N/A;   BREAST LUMPECTOMY WITH RADIOACTIVE SEED LOCALIZATION Right 01/05/2023   Procedure: RIGHT BREAST BRACKETED LUMPECTOMY WITH RADIOACTIVE SEED LOCALIZATION;  Surgeon: Abigail Miyamoto, MD;  Location: [facility] OR;  Service: General;  Laterality: Right;   CARDIAC CATHETERIZATION  08/2011   CHOLECYSTECTOMY     COLONOSCOPY  EP Study  08/22/08   IMPLANTABLE CARDIOVERTER DEFIBRILLATOR GENERATOR CHANGE N/A 08/22/2013   Procedure: IMPLANTABLE CARDIOVERTER DEFIBRILLATOR GENERATOR CHANGE;  Surgeon: Duke Salvia, MD;  Location: [facility] CATH LAB;  Service: Cardiovascular;  Laterality: N/A;   KNEE ARTHROSCOPY Left 04/14/2016   Procedure: LEFT KNEE ARTHROSCOPY, PARTIAL MEDIAL AND PARTIAL LATERAL MENISCECTOMY, MEDIAL PLICA;  Surgeon: Jodi Geralds, MD;  Location: [facility] OR;  Service: Orthopedics;  Laterality: Left;   mastoid  sugery     in the setting of Meniere's disease   TONSILLECTOMY AND ADENOIDECTOMY      Allergies  Allergen Reactions   Carvedilol Other (See Comments)    Pt does not remember reaction  Other Reaction(s): Unknown   Digoxin Other (See Comments)    Dig toxicity   Vancomycin Other (See Comments)    ("Red Man Syndrome") Pt states she turned red and was itching  Unknown  ("Red Man Syndrome") Pt states she turned red and was itching    Unknown    ("Red Man Syndrome") Pt states she turned red and was itching   Pseudoephedrine Other (See Comments)    Makes skin feel like its crawling  Makes skin feel like its crawling Unknown Makes skin feel like its crawling  Makes skin feel like its crawling Unknown  Makes skin feel like its crawling    Makes skin feel like its crawling Unknown Makes skin feel like its crawling    Makes skin feel like its crawling, Unknown    Makes skin feel like its crawling Unknown    Makes skin feel like its crawling    Outpatient Encounter Medications as of 05/04/2023  Medication Sig   acetaminophen (TYLENOL) 500 MG tablet Take 500 mg by mouth every 8 (eight) hours as needed for headache.   aspirin EC 81 MG tablet Take 81 mg by mouth daily. Swallow whole.   atorvastatin (LIPITOR) 40 MG tablet Take 40 mg by mouth daily.   calcium carbonate (CALCIUM 600) 600 MG TABS tablet Take 600 mg by mouth 2 (two) times daily with a meal.   Cholecalciferol (VITAMIN D3) 50 MCG (2000 UT) CAPS Take 1 capsule by mouth daily in the afternoon.   Cranberry 500 MG CAPS Take 2 tablets by mouth daily.   dicyclomine (BENTYL) 10 MG capsule TAKE 1 CAPSULE BY MOUTH THREE TIMES DAILY 30 MINUTES BEFORE MEALS   furosemide (LASIX) 20 MG tablet Take 1 tablet (20 mg total) by mouth daily. alternating (40 mg) every other day.   loperamide (IMODIUM A-D) 2 MG tablet Take 2 mg by mouth as needed for diarrhea or loose stools.   metoprolol succinate (TOPROL-XL) 100 MG 24 hr tablet Take 100 mg by  mouth 2 (two) times daily.   olmesartan (BENICAR) 40 MG tablet Take 40 mg by mouth daily.   potassium chloride (K-DUR) 10 MEQ tablet Take 10 mEq by mouth 2 (two) times daily.   triamcinolone cream (KENALOG) 0.1 % Apply 1 Application topically 2 (two) times daily for 8 days.   [DISCONTINUED] insulin aspart protamine - aspart (NOVOLOG 70/30 MIX) (70-30) 100 UNIT/ML FlexPen Inject 25 Units into the skin 3 (three) times daily before meals. Give if blood sugar > 151   [DISCONTINUED] insulin aspart protamine - aspart (NOVOLOG 70/30 MIX) (70-30) 100 UNIT/ML FlexPen Inject 15 Units into the skin daily with lunch. Give if blood sugar > 151   [DISCONTINUED] insulin aspart protamine - aspart (NOVOLOG 70/30 MIX) (70-30) 100 UNIT/ML FlexPen Inject 28 Units into the skin daily with  supper. Give if blood sugar > 151   insulin aspart protamine - aspart (NOVOLOG 70/30 MIX) (70-30) 100 UNIT/ML FlexPen Inject 25 Units into the skin 3 (three) times daily before meals. Give if blood sugar > 151. Hold for CBG <= 150   [DISCONTINUED] insulin aspart protamine - aspart (NOVOLOG 70/30 MIX) (70-30) 100 UNIT/ML FlexPen Inject 25 Units into the skin 3 (three) times daily before meals. Give if blood sugar > 151. Hold for CBG <= 150   No facility-administered encounter medications on file as of 05/04/2023.    Review of Systems  Constitutional:  Negative for appetite change, chills, fatigue and fever.  HENT:  Negative for congestion, hearing loss, rhinorrhea and sore throat.   Eyes: Negative.   Respiratory:  Negative for cough, shortness of breath and wheezing.   Cardiovascular:  Negative for chest pain, palpitations and leg swelling.  Gastrointestinal:  Negative for abdominal pain, constipation, diarrhea, nausea and vomiting.  Genitourinary:  Negative for dysuria.  Musculoskeletal:  Negative for arthralgias, back pain and myalgias.  Skin:  Negative for color change, rash and wound.  Neurological:  Negative for dizziness,  weakness and headaches.  Psychiatric/Behavioral:  Negative for behavioral problems. The patient is not nervous/anxious.       Immunization History  Administered Date(s) Administered   Influenza, High Dose Seasonal PF 10/06/2016, 10/05/2017, 09/14/2018, 08/17/2022   Influenza,inj,Quad PF,6+ Mos 10/02/2014   PNEUMOCOCCAL CONJUGATE-20 03/16/2023   PPD Test 02/22/2023   Pneumococcal-Unspecified 03/29/2022   Tdap 09/29/2022   Unspecified SARS-COV-2 Vaccination 11/05/2019, 12/03/2019, 03/31/2021, 07/21/2021, 03/19/2022   Zoster Recombinant(Shingrix) 03/07/2023   Pertinent  Health Maintenance Due  Topic Date Due   FOOT EXAM  Never done   OPHTHALMOLOGY EXAM  Never done   INFLUENZA VACCINE  06/02/2023   HEMOGLOBIN A1C  08/27/2023   DEXA SCAN  Completed      03/02/2022    1:19 PM 08/16/2022   11:20 AM 08/30/2022    8:22 AM 09/29/2022    3:01 PM 03/07/2023    9:32 AM  Fall Risk  Falls in the past year? 1 0   1  Was there an injury with Fall? 1    1  Fall Risk Category Calculator 3    3  Fall Risk Category (Retired) High      (RETIRED) Patient Fall Risk Level Low fall risk Low fall risk Low fall risk Moderate fall risk   Patient at Risk for Falls Due to     History of fall(s);Impaired balance/gait  Fall risk Follow up  Falls evaluation completed   Falls evaluation completed     Vitals:   05/04/23 1117 05/04/23 1620  BP: (!) 179/72 137/75  Pulse: 77   Resp: 16   Temp: (!) 97 F (36.1 C)   SpO2: 96%   Weight: 130 lb 12.8 oz (59.3 kg)   Height: 5\' 6"  (1.676 m)    Body mass index is 21.11 kg/m.  Physical Exam Constitutional:      Appearance: Normal appearance.  HENT:     Head: Normocephalic and atraumatic.     Nose: Nose normal.     Mouth/Throat:     Mouth: Mucous membranes are moist.  Eyes:     Conjunctiva/sclera: Conjunctivae normal.  Cardiovascular:     Rate and Rhythm: Normal rate and regular rhythm.  Pulmonary:     Effort: Pulmonary effort is normal.     Breath  sounds: Normal breath sounds.  Abdominal:     General: Bowel sounds  are normal.     Palpations: Abdomen is soft.  Musculoskeletal:        General: Normal range of motion.     Cervical back: Normal range of motion.  Skin:    General: Skin is warm and dry.  Neurological:     General: No focal deficit present.     Mental Status: She is alert and oriented to person, place, and time.  Psychiatric:        Mood and Affect: Mood normal.        Behavior: Behavior normal.        Thought Content: Thought content normal.        Judgment: Judgment normal.        Labs reviewed: Recent Labs    08/02/22 1559 12/08/22 0801 12/31/22 1430 02/25/23 0000  NA 144 140 140 143  K 3.8 3.3* 3.6 3.7  CL 100 102 101 104  CO2 26 27 26  29*  GLUCOSE 124* 185* 82  --   BUN 15 15 13 17   CREATININE 0.89 1.02* 0.94 0.8  CALCIUM 9.2 9.0 10.0 9.0   Recent Labs    12/08/22 0801 02/25/23 0000  AST 19 16  ALT 13 10  ALKPHOS 67 62  BILITOT 0.8  --   PROT 6.6  --   ALBUMIN 4.0 3.8   Recent Labs    08/02/22 1559 12/08/22 0801 12/31/22 1430 02/25/23 0000  WBC 7.2 5.9 7.0 5.3  NEUTROABS  --  4.4  --  3,546.00  HGB 13.8 14.5 14.7 12.9  HCT 40.3 42.2 43.0 38  MCV 95 93.6 95.3  --   PLT 145* 153 178 179   Lab Results  Component Value Date   TSH 1.93 02/25/2023   Lab Results  Component Value Date   HGBA1C 7.5 02/25/2023   Lab Results  Component Value Date   CHOL 116 02/25/2023   HDL 35 02/25/2023   LDLCALC 58 02/25/2023   TRIG 144 02/25/2023   CHOLHDL 2.7 11/10/2020    Significant Diagnostic Results in last 30 days:  No results found.  Assessment/Plan  1. Type 2 diabetes mellitus with other specified complication, with long-term current use of insulin (HCC) Lab Results  Component Value Date   HGBA1C 7.5 02/25/2023   -  discussed with patient and verbalized understanding of new dosage for simplification - insulin aspart protamine - aspart (NOVOLOG 70/30 MIX) (70-30) 100  UNIT/ML FlexPen; Inject 25 Units into the skin 3 (three) times daily before meals. Give if blood sugar > 151. Hold for CBG <= 150  Dispense: 15 mL; Refill: 3 -  continue with education of blood sugar device use  2. Multiple closed fractures of pelvis without disruption of pelvic ring with routine healing, subsequent encounter -  WBAT -  had short-term rehabilitation, PT and OT for therapeutic strengthening exercises -  fall precautions  3. Primary hypertension -  BP 137/75, stable -  continue Metoprolol succinate and Olmesartan  4. Irritable bowel syndrome, unspecified type -  stable -  continue Dicyclomine  5. SYSTOLIC HEART FAILURE, CHRONIC -  no SOB -  continue Furosemide    Family/ staff Communication: Discussed plan of care with resident and charge nurse.  Labs/tests ordered: None    Kenard Gower, DNP, MSN, FNP-BC [facility] [phone] (Monday-Friday 8:00 a.m. - 5:00 p.m.) [phone] (after hours)

## 2023-05-09 ENCOUNTER — Ambulatory Visit: Payer: Medicare Other | Attending: Internal Medicine

## 2023-05-09 DIAGNOSIS — Z95810 Presence of automatic (implantable) cardiac defibrillator: Secondary | ICD-10-CM

## 2023-05-09 DIAGNOSIS — M6281 Muscle weakness (generalized): Secondary | ICD-10-CM | POA: Diagnosis not present

## 2023-05-09 DIAGNOSIS — R41841 Cognitive communication deficit: Secondary | ICD-10-CM | POA: Diagnosis not present

## 2023-05-09 DIAGNOSIS — I5022 Chronic systolic (congestive) heart failure: Secondary | ICD-10-CM | POA: Diagnosis not present

## 2023-05-09 DIAGNOSIS — R29898 Other symptoms and signs involving the musculoskeletal system: Secondary | ICD-10-CM | POA: Diagnosis not present

## 2023-05-09 DIAGNOSIS — R2681 Unsteadiness on feet: Secondary | ICD-10-CM | POA: Diagnosis not present

## 2023-05-09 DIAGNOSIS — M8580 Other specified disorders of bone density and structure, unspecified site: Secondary | ICD-10-CM | POA: Diagnosis not present

## 2023-05-09 DIAGNOSIS — R278 Other lack of coordination: Secondary | ICD-10-CM | POA: Diagnosis not present

## 2023-05-10 ENCOUNTER — Telehealth: Payer: Self-pay

## 2023-05-10 NOTE — Telephone Encounter (Signed)
Patient is still at [facility].

## 2023-05-10 NOTE — Telephone Encounter (Signed)
Fax received from pharmacy regarding insulin prescription.

## 2023-05-10 NOTE — Telephone Encounter (Signed)
Prescription faxed to [facility]

## 2023-05-13 NOTE — Progress Notes (Signed)
EPIC Encounter for ICM Monitoring  Patient Name: Grace Robinson is a 89 y.o. female Date: 05/13/2023 Primary Care Physican: Creola Corn, MD Primary Cardiologist: Graciela Husbands Electrophysiologist: Joycelyn Schmid Pacing:  97.9%     12/24/2022 Weight:  130 lbs   Clinical Status Since 18-Apr-2023 Time in AT/AF  <0.1 hr/day (<0.1%)            Transmission reviewed.   Pt currently in ALF.    Optivol Thoracic impedance suggesting normal fluid levels.       Prescribed:  Furosemide 20 mg take 1 tablet (20 mg total) by mouth daily alternating (40 mg) every other day.   Potassium 10 mEq 1 tablet twice a day.   Labs: 02/25/2023 Creatinine 0.8    BUN 17,  Potassium 3.7, Sodium 143 12/31/2022 Creatinine 0.94, BUN 13, Potassium 3.6, Sodium 140, GFR 56 12/08/2022 Creatinine 1.02, BUN 15, Potassium 3.3, Sodium 140 08/02/2022 Creatinine 0.89, BUN 15, Potassium 3.8, Sodium 144, GFR 60 A complete set of results can be found in Results Review.   Recommendations:  No changes.    Follow-up plan: ICM clinic phone appointment on 06/13/2023.   91 day device clinic remote transmission 06/01/2023.      EP/Cardiology Office Visits:  Recall 09/03/2023 with Dr Graciela Husbands.     Copy of ICM check sent to Dr. Graciela Husbands.    3 month ICM trend: 05/10/2023.    12-14 Month ICM trend:     Karie Soda, RN 05/13/2023 1:56 PM

## 2023-05-19 ENCOUNTER — Non-Acute Institutional Stay: Payer: Medicare Other | Admitting: Nurse Practitioner

## 2023-05-19 ENCOUNTER — Encounter: Payer: Self-pay | Admitting: Nurse Practitioner

## 2023-05-19 VITALS — BP 124/88 | HR 83 | Temp 97.5°F | Resp 18 | Ht 66.0 in | Wt 135.2 lb

## 2023-05-19 DIAGNOSIS — Z780 Asymptomatic menopausal state: Secondary | ICD-10-CM

## 2023-05-19 DIAGNOSIS — E1122 Type 2 diabetes mellitus with diabetic chronic kidney disease: Secondary | ICD-10-CM

## 2023-05-19 DIAGNOSIS — E559 Vitamin D deficiency, unspecified: Secondary | ICD-10-CM

## 2023-05-19 DIAGNOSIS — I10 Essential (primary) hypertension: Secondary | ICD-10-CM

## 2023-05-19 DIAGNOSIS — Z794 Long term (current) use of insulin: Secondary | ICD-10-CM

## 2023-05-19 DIAGNOSIS — E538 Deficiency of other specified B group vitamins: Secondary | ICD-10-CM

## 2023-05-19 DIAGNOSIS — E785 Hyperlipidemia, unspecified: Secondary | ICD-10-CM

## 2023-05-19 DIAGNOSIS — N183 Chronic kidney disease, stage 3 unspecified: Secondary | ICD-10-CM

## 2023-05-19 DIAGNOSIS — I5022 Chronic systolic (congestive) heart failure: Secondary | ICD-10-CM

## 2023-05-19 DIAGNOSIS — R269 Unspecified abnormalities of gait and mobility: Secondary | ICD-10-CM

## 2023-05-19 DIAGNOSIS — E1169 Type 2 diabetes mellitus with other specified complication: Secondary | ICD-10-CM | POA: Diagnosis not present

## 2023-05-19 DIAGNOSIS — M8580 Other specified disorders of bone density and structure, unspecified site: Secondary | ICD-10-CM

## 2023-05-19 DIAGNOSIS — L589 Radiodermatitis, unspecified: Secondary | ICD-10-CM

## 2023-05-19 DIAGNOSIS — K5732 Diverticulitis of large intestine without perforation or abscess without bleeding: Secondary | ICD-10-CM

## 2023-05-19 MED ORDER — ALENDRONATE SODIUM 70 MG PO TABS
70.0000 mg | ORAL_TABLET | ORAL | 11 refills | Status: DC
Start: 2023-05-19 — End: 2023-12-26

## 2023-05-19 NOTE — Assessment & Plan Note (Signed)
on Vit D, Vit D 41 02/24/23 

## 2023-05-19 NOTE — Assessment & Plan Note (Signed)
OP 03/03/23 DEXA t score -1.236, on Ca, Vit D, recommend Fosamax 70mg  qwk vs Evenity 210mg  [place] qmo x12 mos.

## 2023-05-19 NOTE — Assessment & Plan Note (Addendum)
T2DM/renal complication, taking insulin 70/30, 03/04/23 hypoglycemic episode(CBG 47) in pm, stabilized after Glucagon 1mg  IM, reduced Aspart 70/30 to 28u/32u, continued am 32 u, prn noon, Hgb A1c 7.5 02/24/23 The patient desires DEXCOM

## 2023-05-19 NOTE — Assessment & Plan Note (Signed)
takes Vit B12, Hgb 12.9 02/24/23 

## 2023-05-19 NOTE — Assessment & Plan Note (Signed)
Compensated clinically,  taking Furosemide, Bun/creat 17/0.8 02/24/23 

## 2023-05-19 NOTE — Progress Notes (Signed)
Location:   [facility]   Place of Service:    Provider: Chipper Oman NP  Code Status: DNR Goals of Care:     05/19/2023    2:38 PM  Advanced Directives  Does Patient Have a Medical Advance Directive? Yes  Type of Advance Directive Out of facility DNR (pink MOST or yellow form)  Does patient want to make changes to medical advance directive? No - Patient declined  Would patient like information on creating a medical advance directive? No - Patient declined  Pre-existing out of facility DNR order (yellow form or pink MOST form) Yellow form placed in chart (order not valid for inpatient use)     Chief Complaint  Patient presents with   Follow-up    Follow-up   Quality Metric Gaps    Needs to discuss eye and foot exam, Covid and Shingrix vaccine     HPI: Patient is a 89 y.o. female seen today for medical management of chronic diseases.      Healed multiple fractures sustained from fall 02/18/23: pelvic fractures: nondisplaced fxs of R superior and inferior pubic rami, minimally displaced fx of the inferior R sacral ala extended to the R SIJ. Mild compression fx of superior endplates of T12, L2. Prn Tylenol  Fall, mechanical T2DM/renal complication, taking insulin 70/30, 03/04/23 hypoglycemic episode(CBG 47) in pm, stabilized after Glucagon 1mg  IM, reduced Aspart 70/30 to 25u if CBG >150, prn noon, Hgb A1c 7.5 02/24/23 Mild bilateral hydronephrosis, no stones Sigmoid diverticulitis/IBS taking Bentyl, Imodium Aortic atherosclerosis, taking ASA, Atorvastatin HLD, taking Atorvastatin, LDL 58 02/24/23 Vit B12 deficiency, takes Vit B12, Hgb 12.9 02/24/23 Vit D deficiency, on Vit D, Vit D 41 02/24/23 CHF, taking Furosemide, Bun/creat 17/0.8 02/24/23 HTN, taking Metoprolol, Olmesartan OP 03/03/23 DEXA t score -1.236, on Ca, Vit D, recommend Fosamax 70mg  qwk vs Evenity 210mg  Max qmo x12 mos.              S/p R breat cancer lumpectomy, f/u surgeon, 03/15/23 cancer center radiation tx              Radiation dermatitis right upper chest/breast, no s/s of infection.      Past Medical History:  Diagnosis Date   Biventricular ICD (implantable cardiac defibrillator) in place    downgraded from CRT-D to CRT-P 08/30/22   Breast cancer (HCC)    Cellulitis    Change in bowel habits    CHF (congestive heart failure) (HCC)    Colon polyps    Diabetes mellitus    Type II   Diarrhea    Dyslipidemia    HOH (hard of hearing)    left   HTN (hypertension)    LBBB (left bundle branch block)    chronic   Meniere's disease    Nonischemic cardiomyopathy (HCC) 2006   Presence of permanent cardiac pacemaker    T wave oversensing 01/17/2014   Ulcerative colitis    Varicose veins    4 lazer  treatment each leg    Past Surgical History:  Procedure Laterality Date   ABDOMINAL HYSTERECTOMY     BIV PACEMAKER GENERATOR CHANGEOUT N/A 08/30/2022   Procedure: BIV PACEMAKER GENERATOR CHANGEOUT;  Surgeon: Duke Salvia, MD;  Location[facility] INVASIVE CV LAB;  Service: Cardiovascular;  Laterality: N/A;   BREAST LUMPECTOMY WITH RADIOACTIVE SEED LOCALIZATION Right 01/05/2023   Procedure: RIGHT BREAST BRACKETED LUMPECTOMY WITH RADIOACTIVE SEED LOCALIZATION;  Surgeon: Abigail Miyamoto, MD;  Location: [facility] OR;  Service: General;  Laterality: Right;  CARDIAC CATHETERIZATION  08/2011   CHOLECYSTECTOMY     COLONOSCOPY     EP Study  08/22/08   IMPLANTABLE CARDIOVERTER DEFIBRILLATOR GENERATOR CHANGE N/A 08/22/2013   Procedure: IMPLANTABLE CARDIOVERTER DEFIBRILLATOR GENERATOR CHANGE;  Surgeon: Duke Salvia, MD;  Location: [facility] CATH LAB;  Service: Cardiovascular;  Laterality: N/A;   KNEE ARTHROSCOPY Left 04/14/2016   Procedure: LEFT KNEE ARTHROSCOPY, PARTIAL MEDIAL AND PARTIAL LATERAL MENISCECTOMY, MEDIAL PLICA;  Surgeon: Jodi Geralds, MD;  Location: [facility] OR;  Service: Orthopedics;  Laterality: Left;   mastoid sugery     in the setting of Meniere's disease   TONSILLECTOMY AND ADENOIDECTOMY      Allergies  Allergen  Reactions   Carvedilol Other (See Comments)    Pt does not remember reaction  Other Reaction(s): Unknown   Digoxin Other (See Comments)    Dig toxicity   Vancomycin Other (See Comments)    ("Red Duval Macleod Syndrome") Pt states she turned red and was itching  Unknown  ("Red Anetta Olvera Syndrome") Pt states she turned red and was itching    Unknown    ("Red Caliah Kopke Syndrome") Pt states she turned red and was itching   Pseudoephedrine Other (See Comments)    Makes skin feel like its crawling  Makes skin feel like its crawling Unknown Makes skin feel like its crawling  Makes skin feel like its crawling Unknown  Makes skin feel like its crawling    Makes skin feel like its crawling Unknown Makes skin feel like its crawling    Makes skin feel like its crawling, Unknown    Makes skin feel like its crawling Unknown    Makes skin feel like its crawling    Allergies as of 05/19/2023       Reactions   Carvedilol Other (See Comments)   Pt does not remember reaction Other Reaction(s): Unknown   Digoxin Other (See Comments)   Dig toxicity   Vancomycin Other (See Comments)   ("Red Jeremey Bascom Syndrome") Pt states she turned red and was itching Unknown  ("Red Chirsty Armistead Syndrome") Pt states she turned red and was itching    Unknown    ("Red Zinnia Tindall Syndrome") Pt states she turned red and was itching   Pseudoephedrine Other (See Comments)   Makes skin feel like its crawling Makes skin feel like its crawling Unknown Makes skin feel like its crawling  Makes skin feel like its crawling Unknown  Makes skin feel like its crawling    Makes skin feel like its crawling Unknown Makes skin feel like its crawling    Makes skin feel like its crawling, Unknown    Makes skin feel like its crawling Unknown    Makes skin feel like its crawling        Medication List        Accurate as of May 19, 2023 11:59 PM. If you have any questions, ask your nurse or doctor.          acetaminophen 500 MG tablet Commonly known as:  TYLENOL Take 500 mg by mouth every 8 (eight) hours as needed for headache.   alendronate 70 MG tablet Commonly known as: FOSAMAX Take 1 tablet (70 mg total) by mouth every 7 (seven) days. Take with a full glass of water on an empty stomach. Started by: Journey Castong[employer] X Pasqual Farias   aspirin EC 81 MG tablet Take 81 mg by mouth daily. Swallow whole.   atorvastatin 40 MG tablet Commonly known as: LIPITOR Take 40 mg by mouth daily.  Calcium 600 600 MG Tabs tablet Generic drug: calcium carbonate Take 600 mg by mouth 2 (two) times daily with a meal.   Cranberry 500 MG Caps Take 2 tablets by mouth daily.   dicyclomine 10 MG capsule Commonly known as: BENTYL TAKE 1 CAPSULE BY MOUTH THREE TIMES DAILY 30 MINUTES BEFORE MEALS   furosemide 20 MG tablet Commonly known as: LASIX Take 1 tablet (20 mg total) by mouth daily. alternating (40 mg) every other day.   insulin aspart protamine - aspart (70-30) 100 UNIT/ML FlexPen Commonly known as: NOVOLOG 70/30 MIX Inject 25 Units into the skin 3 (three) times daily before meals. Give if blood sugar > 151. Hold for CBG <= 150   loperamide 2 MG tablet Commonly known as: IMODIUM A-D Take 2 mg by mouth as needed for diarrhea or loose stools.   metoprolol succinate 100 MG 24 hr tablet Commonly known as: TOPROL-XL Take 100 mg by mouth 2 (two) times daily.   olmesartan 40 MG tablet Commonly known as: BENICAR Take 40 mg by mouth daily.   potassium chloride 10 MEQ tablet Commonly known as: KLOR-CON M Take 10 mEq by mouth 2 (two) times daily.   vitamin D3 50 MCG (2000 UT) Caps Take 1 capsule by mouth daily in the afternoon.        Review of Systems:  Review of Systems  Constitutional:  Negative for appetite change, fatigue and fever.  HENT:  Positive for hearing loss. Negative for congestion and trouble swallowing.   Eyes:  Negative for visual disturbance.  Respiratory:  Negative for cough, chest tightness and wheezing.   Cardiovascular:  Positive for  leg swelling.  Gastrointestinal:  Negative for abdominal pain, constipation and diarrhea.  Genitourinary:  Negative for dysuria and urgency.  Musculoskeletal:  Positive for arthralgias, back pain and gait problem.  Skin:  Positive for rash. Negative for color change.  Neurological:  Negative for tremors and headaches.  Psychiatric/Behavioral:  Negative for behavioral problems and sleep disturbance. The patient is not nervous/anxious.     Health Maintenance  Topic Date Due   FOOT EXAM  Never done   OPHTHALMOLOGY EXAM  Never done   COVID-19 Vaccine (6 - 2023-24 season) 07/02/2022   Zoster Vaccines- Shingrix (2 of 2) 05/02/2023   INFLUENZA VACCINE  06/02/2023   HEMOGLOBIN A1C  08/27/2023   Medicare Annual Wellness (AWV)  03/01/2024   DTaP/Tdap/Td (2 - Td or Tdap) 09/29/2032   Pneumonia Vaccine 107+ Years old  Completed   DEXA SCAN  Completed   HPV VACCINES  Aged Out    Physical Exam: Vitals:   05/19/23 1417  BP: 124/88  Pulse: 83  Resp: 18  Temp: (!) 97.5 F (36.4 C)  SpO2: 97%  Weight: 135 lb 3.2 oz (61.3 kg)  Height: 5\' 6"  (1.676 m)   Body mass index is 21.82 kg/m. Physical Exam Vitals and nursing note reviewed.  Constitutional:      Appearance: Normal appearance.  HENT:     Head: Normocephalic and atraumatic.     Nose: Nose normal.     Mouth/Throat:     Mouth: Mucous membranes are moist.  Eyes:     Extraocular Movements: Extraocular movements intact.     Conjunctiva/sclera: Conjunctivae normal.     Pupils: Pupils are equal, round, and reactive to light.  Cardiovascular:     Rate and Rhythm: Normal rate and regular rhythm.     Heart sounds: No murmur heard.    Comments: Pacemaker left upper  chest(defibrillator was removed per patient's request in the past) Pulmonary:     Effort: Pulmonary effort is normal.     Breath sounds: No rales.  Abdominal:     General: Bowel sounds are normal.     Palpations: Abdomen is soft.     Tenderness: There is no abdominal  tenderness.  Musculoskeletal:        General: Tenderness present.     Cervical back: Normal range of motion and neck supple.     Right lower leg: Edema present.     Left lower leg: Edema present.     Comments: Lower back, pelvic/thigh pain with movement/weight bearing. Trace edema BLE  Skin:    General: Skin is warm and dry.     Findings: Rash present.     Comments: S/p R breat cancer lumpectomy, no apparent edema R arm.   Radiation dermatitis right upper chest/breast, no s/s of infection.    Neurological:     General: No focal deficit present.     Mental Status: She is alert and oriented to person, place, and time. Mental status is at baseline.     Motor: No weakness.     Gait: Gait abnormal.  Psychiatric:        Mood and Affect: Mood normal.        Behavior: Behavior normal.        Thought Content: Thought content normal.     Labs reviewed: Basic Metabolic Panel: Recent Labs    08/02/22 1559 12/08/22 0801 12/31/22 1430 02/25/23 0000  NA 144 140 140 143  K 3.8 3.3* 3.6 3.7  CL 100 102 101 104  CO2 26 27 26  29*  GLUCOSE 124* 185* 82  --   BUN 15 15 13 17   CREATININE 0.89 1.02* 0.94 0.8  CALCIUM 9.2 9.0 10.0 9.0  TSH  --   --   --  1.93   Liver Function Tests: Recent Labs    12/08/22 0801 02/25/23 0000  AST 19 16  ALT 13 10  ALKPHOS 67 62  BILITOT 0.8  --   PROT 6.6  --   ALBUMIN 4.0 3.8   No results for input(s): "LIPASE", "AMYLASE" in the last 8760 hours. No results for input(s): "AMMONIA" in the last 8760 hours. CBC: Recent Labs    08/02/22 1559 12/08/22 0801 12/31/22 1430 02/25/23 0000  WBC 7.2 5.9 7.0 5.3  NEUTROABS  --  4.4  --  3,546.00  HGB 13.8 14.5 14.7 12.9  HCT 40.3 42.2 43.0 38  MCV 95 93.6 95.3  --   PLT 145* 153 178 179   Lipid Panel: Recent Labs    02/25/23 0000  CHOL 116  HDL 35  LDLCALC 58  TRIG 144   Lab Results  Component Value Date   HGBA1C 7.5 02/25/2023    Procedures since last visit: No results  found.  Assessment/Plan  Type 2 diabetes mellitus with renal complication (HCC) T2DM/renal complication, taking insulin 70/30, 03/04/23 hypoglycemic episode(CBG 47) in pm, stabilized after Glucagon 1mg  IM, reduced Aspart 70/30 to 28u/32u, continued am 32 u, prn noon, Hgb A1c 7.5 02/24/23 The patient desires DEXCOM  Sigmoid diverticulitis Sigmoid diverticulitis/IBS taking Bentyl, Imodium  Hyperlipidemia associated with type 2 diabetes mellitus (HCC) Aortic atherosclerosis, taking ASA, Atorvastatin HLD, taking Atorvastatin, LDL 58 02/24/23  Vitamin B12 deficiency takes Vit B12, Hgb 12.9 02/24/23  Vitamin D deficiency  on Vit D, Vit D 41 02/24/23  SYSTOLIC HEART FAILURE, CHRONIC Compensated clinically, taking Furosemide,  Bun/creat 17/0.8 02/24/23  HTN (hypertension) Blood pressure is controlled, taking Metoprolol, Olmesartan  Osteopenia after menopause OP 03/03/23 DEXA t score -1.236, on Ca, Vit D, recommend Fosamax 70mg  qwk vs Evenity 210mg  [place] qmo x12 mos.   Radiation dermatitis  S/p R breat cancer lumpectomy, f/u surgeon, 03/15/23 cancer center radiation tx             Radiation dermatitis right upper chest/breast, no s/s of infection.   Gait abnormality Uses walker for ambulation, hx of healed multiple fractures sustained from fall 02/18/23: pelvic fractures: nondisplaced fxs of R superior and inferior pubic rami, minimally displaced fx of the inferior R sacral ala extended to the R SIJ. Mild compression fx of superior endplates of T12, L2. Prn Tylenol    Labs/tests ordered: none  Next appt:  2 weeks

## 2023-05-19 NOTE — Assessment & Plan Note (Signed)
Sigmoid diverticulitis/IBS taking Bentyl, Imodium

## 2023-05-19 NOTE — Assessment & Plan Note (Signed)
Blood pressure is controlled,  taking Metoprolol, Olmesartan 

## 2023-05-19 NOTE — Assessment & Plan Note (Signed)
Uses walker for ambulation, hx of healed multiple fractures sustained from fall 02/18/23: pelvic fractures: nondisplaced fxs of R superior and inferior pubic rami, minimally displaced fx of the inferior R sacral ala extended to the R SIJ. Mild compression fx of superior endplates of T12, L2. Prn Tylenol

## 2023-05-19 NOTE — Assessment & Plan Note (Signed)
S/p R breat cancer lumpectomy, f/u surgeon, 03/15/23 cancer center radiation tx  Radiation dermatitis right upper chest/breast, no s/s of infection.

## 2023-05-19 NOTE — Assessment & Plan Note (Signed)
Aortic atherosclerosis, taking ASA, Atorvastatin HLD, taking Atorvastatin, LDL 58 02/24/23

## 2023-05-26 DIAGNOSIS — R278 Other lack of coordination: Secondary | ICD-10-CM | POA: Diagnosis not present

## 2023-05-26 DIAGNOSIS — R29898 Other symptoms and signs involving the musculoskeletal system: Secondary | ICD-10-CM | POA: Diagnosis not present

## 2023-05-26 DIAGNOSIS — M6281 Muscle weakness (generalized): Secondary | ICD-10-CM | POA: Diagnosis not present

## 2023-05-26 DIAGNOSIS — R2681 Unsteadiness on feet: Secondary | ICD-10-CM | POA: Diagnosis not present

## 2023-05-26 DIAGNOSIS — R41841 Cognitive communication deficit: Secondary | ICD-10-CM | POA: Diagnosis not present

## 2023-05-30 DIAGNOSIS — R278 Other lack of coordination: Secondary | ICD-10-CM | POA: Diagnosis not present

## 2023-05-30 DIAGNOSIS — R29898 Other symptoms and signs involving the musculoskeletal system: Secondary | ICD-10-CM | POA: Diagnosis not present

## 2023-05-30 DIAGNOSIS — R41841 Cognitive communication deficit: Secondary | ICD-10-CM | POA: Diagnosis not present

## 2023-05-30 DIAGNOSIS — M6281 Muscle weakness (generalized): Secondary | ICD-10-CM | POA: Diagnosis not present

## 2023-05-30 DIAGNOSIS — R2681 Unsteadiness on feet: Secondary | ICD-10-CM | POA: Diagnosis not present

## 2023-06-01 ENCOUNTER — Ambulatory Visit: Payer: Medicare Other

## 2023-06-01 DIAGNOSIS — I428 Other cardiomyopathies: Secondary | ICD-10-CM

## 2023-06-01 LAB — CUP PACEART REMOTE DEVICE CHECK
Battery Remaining Longevity: 135 mo
Battery Voltage: 3.09 V
Brady Statistic AP VP Percent: 25.29 %
Brady Statistic AP VS Percent: 0.38 %
Brady Statistic AS VP Percent: 72.39 %
Brady Statistic AS VS Percent: 1.94 %
Brady Statistic RA Percent Paced: 25.78 %
Brady Statistic RV Percent Paced: 0.48 %
Date Time Interrogation Session: 20240731003924
Implantable Lead Connection Status: 753985
Implantable Lead Connection Status: 753985
Implantable Lead Connection Status: 753985
Implantable Lead Connection Status: 753985
Implantable Lead Implant Date: 20031007
Implantable Lead Implant Date: 20031007
Implantable Lead Implant Date: 20031007
Implantable Lead Implant Date: 20060227
Implantable Lead Location: 753858
Implantable Lead Location: 753859
Implantable Lead Location: 753860
Implantable Lead Location: 753860
Implantable Lead Model: 4193
Implantable Lead Model: 4469
Implantable Lead Model: 4470
Implantable Lead Model: 6949
Implantable Lead Serial Number: 337988
Implantable Lead Serial Number: 361236
Implantable Pulse Generator Implant Date: 20231030
Lead Channel Impedance Value: 3363 Ohm
Lead Channel Impedance Value: 3363 Ohm
Lead Channel Impedance Value: 3363 Ohm
Lead Channel Impedance Value: 342 Ohm
Lead Channel Impedance Value: 342 Ohm
Lead Channel Impedance Value: 380 Ohm
Lead Channel Impedance Value: 513 Ohm
Lead Channel Impedance Value: 532 Ohm
Lead Channel Impedance Value: 608 Ohm
Lead Channel Pacing Threshold Amplitude: 0.75 V
Lead Channel Pacing Threshold Amplitude: 1 V
Lead Channel Pacing Threshold Pulse Width: 0.4 ms
Lead Channel Pacing Threshold Pulse Width: 0.4 ms
Lead Channel Sensing Intrinsic Amplitude: 1.375 mV
Lead Channel Sensing Intrinsic Amplitude: 1.375 mV
Lead Channel Sensing Intrinsic Amplitude: 31.625 mV
Lead Channel Sensing Intrinsic Amplitude: 31.625 mV
Lead Channel Setting Pacing Amplitude: 1.5 V
Lead Channel Setting Pacing Amplitude: 1.5 V
Lead Channel Setting Pacing Amplitude: 2.75 V
Lead Channel Setting Pacing Pulse Width: 0.4 ms
Lead Channel Setting Pacing Pulse Width: 0.8 ms
Lead Channel Setting Sensing Sensitivity: 1.2 mV
Zone Setting Status: 755011

## 2023-06-02 ENCOUNTER — Non-Acute Institutional Stay: Payer: Medicare Other | Admitting: Nurse Practitioner

## 2023-06-02 ENCOUNTER — Encounter: Payer: Self-pay | Admitting: Nurse Practitioner

## 2023-06-02 VITALS — BP 136/74 | HR 90 | Temp 98.4°F | Ht 66.0 in | Wt 137.0 lb

## 2023-06-02 DIAGNOSIS — M8580 Other specified disorders of bone density and structure, unspecified site: Secondary | ICD-10-CM

## 2023-06-02 DIAGNOSIS — L589 Radiodermatitis, unspecified: Secondary | ICD-10-CM

## 2023-06-02 DIAGNOSIS — Z794 Long term (current) use of insulin: Secondary | ICD-10-CM

## 2023-06-02 DIAGNOSIS — Z780 Asymptomatic menopausal state: Secondary | ICD-10-CM

## 2023-06-02 DIAGNOSIS — N183 Chronic kidney disease, stage 3 unspecified: Secondary | ICD-10-CM

## 2023-06-02 DIAGNOSIS — Z66 Do not resuscitate: Secondary | ICD-10-CM | POA: Diagnosis not present

## 2023-06-02 DIAGNOSIS — E1122 Type 2 diabetes mellitus with diabetic chronic kidney disease: Secondary | ICD-10-CM

## 2023-06-02 DIAGNOSIS — I10 Essential (primary) hypertension: Secondary | ICD-10-CM

## 2023-06-02 DIAGNOSIS — R2681 Unsteadiness on feet: Secondary | ICD-10-CM | POA: Diagnosis not present

## 2023-06-02 DIAGNOSIS — I5022 Chronic systolic (congestive) heart failure: Secondary | ICD-10-CM | POA: Diagnosis not present

## 2023-06-02 DIAGNOSIS — M1990 Unspecified osteoarthritis, unspecified site: Secondary | ICD-10-CM

## 2023-06-02 DIAGNOSIS — E785 Hyperlipidemia, unspecified: Secondary | ICD-10-CM

## 2023-06-02 DIAGNOSIS — M6281 Muscle weakness (generalized): Secondary | ICD-10-CM | POA: Diagnosis not present

## 2023-06-02 DIAGNOSIS — E1169 Type 2 diabetes mellitus with other specified complication: Secondary | ICD-10-CM

## 2023-06-02 MED ORDER — DEXCOM G6 RECEIVER DEVI
1.0000 | 11 refills | Status: DC
Start: 2023-06-02 — End: 2023-11-07

## 2023-06-02 MED ORDER — DEXCOM G6 SENSOR MISC
1.0000 | 11 refills | Status: DC
Start: 2023-06-02 — End: 2023-11-07

## 2023-06-02 MED ORDER — DEXCOM G6 TRANSMITTER MISC
1.0000 | 11 refills | Status: DC
Start: 2023-06-02 — End: 2023-11-07

## 2023-06-02 NOTE — Assessment & Plan Note (Signed)
taking Furosemide, Bun/creat 17/0.8 02/24/23 

## 2023-06-02 NOTE — Assessment & Plan Note (Signed)
S/p R breat cancer lumpectomy, f/u surgeon, 03/15/23 cancer center radiation tx  Radiation dermatitis right upper chest/breast, no s/s of infection.

## 2023-06-02 NOTE — Assessment & Plan Note (Signed)
Blood pressure is controlled,  taking Metoprolol, Olmesartan 

## 2023-06-02 NOTE — Assessment & Plan Note (Signed)
taking insulin 70/30, 03/04/23 hypoglycemic episode(CBG 47) in pm, stabilized after Glucagon 1mg  IM, reduced Aspart 70/30 to 25u if CBG >150, prn noon, Hgb A1c 7.5 02/24/23

## 2023-06-02 NOTE — Assessment & Plan Note (Signed)
taking Atorvastatin, LDL 58 02/24/23 

## 2023-06-02 NOTE — Assessment & Plan Note (Signed)
03/03/23 DEXA t score -1.236, on Ca, Vit D, recommend Fosamax 70mg qwk vs Evenity 210mg Sanderson qmo x12 mos.  

## 2023-06-02 NOTE — Assessment & Plan Note (Signed)
Healed multiple fractures sustained from fall 02/18/23: pelvic fractures: nondisplaced fxs of R superior and inferior pubic rami, minimally displaced fx of the inferior R sacral ala extended to the R SIJ. Mild compression fx of superior endplates of T12, L2. Prn Tylenol  Fall, mechanical

## 2023-06-02 NOTE — Progress Notes (Signed)
Location:   Clinic [facility]   Place of Service:  Clinic (12) Provider: Chipper Oman NP  Code Status: DNR Goals of Care:     06/02/2023   11:29 AM  Advanced Directives  Does Patient Have a Medical Advance Directive? Yes  Type of Advance Directive Out of facility DNR (pink MOST or yellow form)  Does patient want to make changes to medical advance directive? No - Patient declined  Would patient like information on creating a medical advance directive? No - Patient declined  Pre-existing out of facility DNR order (yellow form or pink MOST form) Yellow form placed in chart (order not valid for inpatient use)     Chief Complaint  Patient presents with   Medical Management of Chronic Issues    2 week follow-up. Here with son Jomarie Longs. Patient c/o of being tired a lot despite sleeping well.     HPI: Patient is a 89 y.o. female seen today for medical management of chronic diseases.      Healed multiple fractures sustained from fall 02/18/23: pelvic fractures: nondisplaced fxs of R superior and inferior pubic rami, minimally displaced fx of the inferior R sacral ala extended to the R SIJ. Mild compression fx of superior endplates of T12, L2. Prn Tylenol  Fall, mechanical T2DM/renal complication, taking insulin 70/30, 03/04/23 hypoglycemic episode(CBG 47) in pm, stabilized after Glucagon 1mg  IM, reduced Aspart 70/30 to 25u if CBG >150, prn noon, Hgb A1c 7.5 02/24/23 Mild bilateral hydronephrosis, no stones Sigmoid diverticulitis/IBS taking Bentyl, Imodium Aortic atherosclerosis, taking ASA, Atorvastatin HLD, taking Atorvastatin, LDL 58 02/24/23 Vit B12 deficiency, takes Vit B12, Hgb 12.9 02/24/23 Vit D deficiency, on Vit D, Vit D 41 02/24/23 CHF, taking Furosemide, Bun/creat 17/0.8 02/24/23 HTN, taking Metoprolol, Olmesartan OP 03/03/23 DEXA t score -1.236, on Ca, Vit D, recommend Fosamax 70mg  qwk vs Evenity 210mg  [place] qmo x12 mos.              S/p R breat cancer lumpectomy, f/u surgeon, 03/15/23 cancer  center radiation tx             Radiation dermatitis right upper chest/breast, no s/s of infection.    Past Medical History:  Diagnosis Date   Biventricular ICD (implantable cardiac defibrillator) in place    downgraded from CRT-D to CRT-P 08/30/22   Breast cancer (HCC)    Cellulitis    Change in bowel habits    CHF (congestive heart failure) (HCC)    Colon polyps    Diabetes mellitus    Type II   Diarrhea    Dyslipidemia    HOH (hard of hearing)    left   HTN (hypertension)    LBBB (left bundle branch block)    chronic   Meniere's disease    Nonischemic cardiomyopathy (HCC) 2006   Presence of permanent cardiac pacemaker    T wave oversensing 01/17/2014   Ulcerative colitis    Varicose veins    4 lazer  treatment each leg    Past Surgical History:  Procedure Laterality Date   ABDOMINAL HYSTERECTOMY     BIV PACEMAKER GENERATOR CHANGEOUT N/A 08/30/2022   Procedure: BIV PACEMAKER GENERATOR CHANGEOUT;  Surgeon: Duke Salvia, MD;  Location: [facility] INVASIVE CV LAB;  Service: Cardiovascular;  Laterality: N/A;   BREAST LUMPECTOMY WITH RADIOACTIVE SEED LOCALIZATION Right 01/05/2023   Procedure: RIGHT BREAST BRACKETED LUMPECTOMY WITH RADIOACTIVE SEED LOCALIZATION;  Surgeon: Abigail Miyamoto, MD;  Location: [facility] OR;  Service: General;  Laterality: Right;   CARDIAC  CATHETERIZATION  08/2011   CHOLECYSTECTOMY     COLONOSCOPY     EP Study  08/22/08   IMPLANTABLE CARDIOVERTER DEFIBRILLATOR GENERATOR CHANGE N/A 08/22/2013   Procedure: IMPLANTABLE CARDIOVERTER DEFIBRILLATOR GENERATOR CHANGE;  Surgeon: Duke Salvia, MD;  Location: [facility] CATH LAB;  Service: Cardiovascular;  Laterality: N/A;   KNEE ARTHROSCOPY Left 04/14/2016   Procedure: LEFT KNEE ARTHROSCOPY, PARTIAL MEDIAL AND PARTIAL LATERAL MENISCECTOMY, MEDIAL PLICA;  Surgeon: Jodi Geralds, MD;  Location: [facility] OR;  Service: Orthopedics;  Laterality: Left;   mastoid sugery     in the setting of Meniere's disease   TONSILLECTOMY AND  ADENOIDECTOMY      Allergies  Allergen Reactions   Carvedilol Other (See Comments)    Pt does not remember reaction  Other Reaction(s): Unknown   Digoxin Other (See Comments)    Dig toxicity   Vancomycin Other (See Comments)    ("Red Tytiana Coles Syndrome") Pt states she turned red and was itching  Unknown  ("Red Shatima Zalar Syndrome") Pt states she turned red and was itching    Unknown    ("Red Evangelyne Loja Syndrome") Pt states she turned red and was itching   Pseudoephedrine Other (See Comments)    Makes skin feel like its crawling  Makes skin feel like its crawling Unknown Makes skin feel like its crawling  Makes skin feel like its crawling Unknown  Makes skin feel like its crawling    Makes skin feel like its crawling Unknown Makes skin feel like its crawling    Makes skin feel like its crawling, Unknown    Makes skin feel like its crawling Unknown    Makes skin feel like its crawling    Allergies as of 06/02/2023       Reactions   Carvedilol Other (See Comments)   Pt does not remember reaction Other Reaction(s): Unknown   Digoxin Other (See Comments)   Dig toxicity   Vancomycin Other (See Comments)   ("Red Edinson Domeier Syndrome") Pt states she turned red and was itching Unknown  ("Red Benino Korinek Syndrome") Pt states she turned red and was itching    Unknown    ("Red Ayrabella Labombard Syndrome") Pt states she turned red and was itching   Pseudoephedrine Other (See Comments)   Makes skin feel like its crawling Makes skin feel like its crawling Unknown Makes skin feel like its crawling  Makes skin feel like its crawling Unknown  Makes skin feel like its crawling    Makes skin feel like its crawling Unknown Makes skin feel like its crawling    Makes skin feel like its crawling, Unknown    Makes skin feel like its crawling Unknown    Makes skin feel like its crawling        Medication List        Accurate as of June 02, 2023  4:56 PM. If you have any questions, ask your nurse or doctor.           acetaminophen 500 MG tablet Commonly known as: TYLENOL Take 500 mg by mouth every 8 (eight) hours as needed for headache.   alendronate 70 MG tablet Commonly known as: FOSAMAX Take 1 tablet (70 mg total) by mouth every 7 (seven) days. Take with a full glass of water on an empty stomach.   aspirin EC 81 MG tablet Take 81 mg by mouth daily. Swallow whole.   atorvastatin 40 MG tablet Commonly known as: LIPITOR Take 40 mg by mouth daily.   Calcium 600 600 MG  Tabs tablet Generic drug: calcium carbonate Take 600 mg by mouth 2 (two) times daily with a meal.   Cranberry 500 MG Caps Take 2 tablets by mouth daily.   Dexcom G6 Receiver Devi 1 Device by Does not apply route as directed. Started by: Valora Norell X Arine Foley   Dexcom G6 Sensor Misc 1 Device by Does not apply route as directed. Started by: Erek Kowal X Jasmine Maceachern   Dexcom G6 Transmitter Misc 1 Device by Does not apply route as directed. Started by: Ashani Pumphrey X Othal Kubitz   dicyclomine 10 MG capsule Commonly known as: BENTYL TAKE 1 CAPSULE BY MOUTH THREE TIMES DAILY 30 MINUTES BEFORE MEALS   furosemide 20 MG tablet Commonly known as: LASIX Take 1 tablet (20 mg total) by mouth daily. alternating (40 mg) every other day.   insulin aspart protamine - aspart (70-30) 100 UNIT/ML FlexPen Commonly known as: NOVOLOG 70/30 MIX Inject 25 Units into the skin 3 (three) times daily before meals. Give if blood sugar > 151. Hold for CBG <= 150   loperamide 2 MG tablet Commonly known as: IMODIUM A-D Take 2 mg by mouth as needed for diarrhea or loose stools.   metoprolol succinate 100 MG 24 hr tablet Commonly known as: TOPROL-XL Take 100 mg by mouth 2 (two) times daily.   olmesartan 40 MG tablet Commonly known as: BENICAR Take 40 mg by mouth daily.   potassium chloride 10 MEQ tablet Commonly known as: KLOR-CON M Take 10 mEq by mouth 2 (two) times daily.   vitamin D3 50 MCG (2000 UT) Caps Take 1 capsule by mouth daily in the afternoon.        Review  of Systems:  Review of Systems  Health Maintenance  Topic Date Due   FOOT EXAM  Never done   OPHTHALMOLOGY EXAM  Never done   COVID-19 Vaccine (6 - 2023-24 season) 07/02/2022   Zoster Vaccines- Shingrix (2 of 2) 05/02/2023   INFLUENZA VACCINE  06/02/2023   HEMOGLOBIN A1C  08/27/2023   Medicare Annual Wellness (AWV)  03/01/2024   DTaP/Tdap/Td (2 - Td or Tdap) 09/29/2032   Pneumonia Vaccine 48+ Years old  Completed   DEXA SCAN  Completed   HPV VACCINES  Aged Out    Physical Exam: Vitals:   06/02/23 1128  BP: 136/74  Pulse: 90  Temp: 98.4 F (36.9 C)  TempSrc: Temporal  SpO2: 96%  Weight: 137 lb (62.1 kg)  Height: 5\' 6"  (1.676 m)   Body mass index is 22.11 kg/m. Physical Exam  Labs reviewed: Basic Metabolic Panel: Recent Labs    08/02/22 1559 12/08/22 0801 12/31/22 1430 02/25/23 0000  NA 144 140 140 143  K 3.8 3.3* 3.6 3.7  CL 100 102 101 104  CO2 26 27 26  29*  GLUCOSE 124* 185* 82  --   BUN 15 15 13 17   CREATININE 0.89 1.02* 0.94 0.8  CALCIUM 9.2 9.0 10.0 9.0  TSH  --   --   --  1.93   Liver Function Tests: Recent Labs    12/08/22 0801 02/25/23 0000  AST 19 16  ALT 13 10  ALKPHOS 67 62  BILITOT 0.8  --   PROT 6.6  --   ALBUMIN 4.0 3.8   No results for input(s): "LIPASE", "AMYLASE" in the last 8760 hours. No results for input(s): "AMMONIA" in the last 8760 hours. CBC: Recent Labs    08/02/22 1559 12/08/22 0801 12/31/22 1430 02/25/23 0000  WBC 7.2 5.9 7.0 5.3  NEUTROABS  --  4.4  --  3,546.00  HGB 13.8 14.5 14.7 12.9  HCT 40.3 42.2 43.0 38  MCV 95 93.6 95.3  --   PLT 145* 153 178 179   Lipid Panel: Recent Labs    02/25/23 0000  CHOL 116  HDL 35  LDLCALC 58  TRIG 144   Lab Results  Component Value Date   HGBA1C 7.5 02/25/2023    Procedures since last visit: CUP PACEART REMOTE DEVICE CHECK  Result Date: 06/01/2023 Scheduled remote reviewed. Normal device function.  Since last scheduled remote 5/1 there have been 6 NSVT,  previous reports 9-14 beats 2 events 7/10, 16 & 31 beats @ 19:48 Next remote 91 days. LA, CVRS   Assessment/Plan  Type 2 diabetes mellitus with renal complication (HCC) taking insulin 70/30, 03/04/23 hypoglycemic episode(CBG 47) in pm, stabilized after Glucagon 1mg  IM, reduced Aspart 70/30 to 25u if CBG >150, prn noon, Hgb A1c 7.5 02/24/23  SYSTOLIC HEART FAILURE, CHRONIC  taking Furosemide, Bun/creat 17/0.8 02/24/23  HTN (hypertension) Blood pressure is controlled, taking Metoprolol, Olmesartan  Osteopenia after menopause  03/03/23 DEXA t score -1.236, on Ca, Vit D, recommend Fosamax 70mg  qwk vs Evenity 210mg  [place] qmo x12 mos.   Radiation dermatitis  S/p R breat cancer lumpectomy, f/u surgeon, 03/15/23 cancer center radiation tx             Radiation dermatitis right upper chest/breast, no s/s of infection.   Osteoarthritis Healed multiple fractures sustained from fall 02/18/23: pelvic fractures: nondisplaced fxs of R superior and inferior pubic rami, minimally displaced fx of the inferior R sacral ala extended to the R SIJ. Mild compression fx of superior endplates of T12, L2. Prn Tylenol  Fall, mechanical  Hyperlipidemia associated with type 2 diabetes mellitus (HCC) taking Atorvastatin, LDL 58 02/24/23   Labs/tests ordered:  none Next appt:  4 months.

## 2023-06-06 ENCOUNTER — Other Ambulatory Visit: Payer: Self-pay | Admitting: Nurse Practitioner

## 2023-06-06 ENCOUNTER — Telehealth: Payer: Self-pay | Admitting: *Deleted

## 2023-06-06 DIAGNOSIS — U071 COVID-19: Secondary | ICD-10-CM

## 2023-06-06 MED ORDER — PAXLOVID (300/100) 20 X 150 MG & 10 X 100MG PO TBPK
3.0000 | ORAL_TABLET | Freq: Two times a day (BID) | ORAL | 0 refills | Status: AC
Start: 2023-06-06 — End: 2023-06-11

## 2023-06-06 NOTE — Telephone Encounter (Signed)
Mast, Man X, NP  You8 minutes ago (3:09 PM)   I sent Paxlovid in for her, please make an appt Thur in the clinic [facility].  Thank you    Patient Notified and Agreed. Scheduled an appointment for Thursday at Clinic with [facility].

## 2023-06-06 NOTE — Telephone Encounter (Signed)
Rosey Bath, nurse with [facility] called and stated that patient tested POSITIVE for Covid this morning.  Cold Symptoms, no fever. No other symptoms noted.   Please Advise. (No available appointment.)

## 2023-06-08 ENCOUNTER — Encounter: Payer: Self-pay | Admitting: Nurse Practitioner

## 2023-06-09 ENCOUNTER — Encounter: Payer: Self-pay | Admitting: Nurse Practitioner

## 2023-06-09 ENCOUNTER — Non-Acute Institutional Stay: Payer: Medicare Other | Admitting: Nurse Practitioner

## 2023-06-09 VITALS — BP 132/84 | HR 91 | Temp 98.2°F | Ht 66.0 in | Wt 134.0 lb

## 2023-06-09 DIAGNOSIS — I5022 Chronic systolic (congestive) heart failure: Secondary | ICD-10-CM | POA: Diagnosis not present

## 2023-06-09 DIAGNOSIS — U071 COVID-19: Secondary | ICD-10-CM

## 2023-06-09 DIAGNOSIS — E1122 Type 2 diabetes mellitus with diabetic chronic kidney disease: Secondary | ICD-10-CM | POA: Diagnosis not present

## 2023-06-09 DIAGNOSIS — I10 Essential (primary) hypertension: Secondary | ICD-10-CM

## 2023-06-09 DIAGNOSIS — Z794 Long term (current) use of insulin: Secondary | ICD-10-CM

## 2023-06-09 DIAGNOSIS — D0511 Intraductal carcinoma in situ of right breast: Secondary | ICD-10-CM

## 2023-06-09 DIAGNOSIS — Z66 Do not resuscitate: Secondary | ICD-10-CM | POA: Diagnosis not present

## 2023-06-09 DIAGNOSIS — N183 Chronic kidney disease, stage 3 unspecified: Secondary | ICD-10-CM

## 2023-06-09 NOTE — Assessment & Plan Note (Signed)
Blood pressure is controlled,  taking Metoprolol, Olmesartan 

## 2023-06-09 NOTE — Assessment & Plan Note (Signed)
taking Furosemide, Bun/creat 17/0.8 02/24/23 

## 2023-06-09 NOTE — Progress Notes (Signed)
Location:   Clinic [facility]   Place of Service:  Clinic (12) Provider: Chipper Oman NP  Code Status: DNR Goals of Care:     06/02/2023   11:29 AM  Advanced Directives  Does Patient Have a Medical Advance Directive? Yes  Type of Advance Directive Out of facility DNR (pink MOST or yellow form)  Does patient want to make changes to medical advance directive? No - Patient declined  Would patient like information on creating a medical advance directive? No - Patient declined  Pre-existing out of facility DNR order (yellow form or pink MOST form) Yellow form placed in chart (order not valid for inpatient use)     Chief Complaint  Patient presents with   Acute Visit    Covid follow-up. Diagnosis with covid last week, today will be the first day out of quarantine, patient is feeling better, other than intense diarrhea and low energy.     HPI: Patient is a 89 y.o. female seen today for an acute visit for COVID + 06/06/23, presented with cold symptoms, Plaxlovid started, resolved running nose and sore throat, denied cough, SOB, phlegm production, or chest pain, her appetite and energy level at her baseline.    Healed multiple fractures sustained from fall 02/18/23: pelvic fractures: nondisplaced fxs of R superior and inferior pubic rami, minimally displaced fx of the inferior R sacral ala extended to the R SIJ. Mild compression fx of superior endplates of T12, L2. Prn Tylenol  Fall, mechanical T2DM/renal complication, taking insulin 70/30, 03/04/23 hypoglycemic episode(CBG 47) in pm, stabilized after Glucagon 1mg  IM, reduced Aspart 70/30 to 25u if CBG >150, prn noon, Hgb A1c 7.5 02/24/23 Mild bilateral hydronephrosis, no stones Sigmoid diverticulitis/IBS taking Bentyl, Imodium Aortic atherosclerosis, taking ASA, Atorvastatin HLD, taking Atorvastatin, LDL 58 02/24/23 Vit B12 deficiency, takes Vit B12, Hgb 12.9 02/24/23 Vit D deficiency, on Vit D, Vit D 41 02/24/23 CHF, taking Furosemide, Bun/creat 17/0.8  02/24/23 HTN, taking Metoprolol, Olmesartan OP 03/03/23 DEXA t score -1.236, on Ca, Vit D, recommend Fosamax 70mg  qwk vs Evenity 210mg  [place] qmo x12 mos.              S/p R breat cancer lumpectomy, f/u surgeon, 03/15/23 cancer center radiation tx             Radiation dermatitis right upper chest/breast, no s/s of infection.     Past Medical History:  Diagnosis Date   Biventricular ICD (implantable cardiac defibrillator) in place    downgraded from CRT-D to CRT-P 08/30/22   Breast cancer (HCC)    Cellulitis    Change in bowel habits    CHF (congestive heart failure) (HCC)    Colon polyps    Diabetes mellitus    Type II   Diarrhea    Dyslipidemia    HOH (hard of hearing)    left   HTN (hypertension)    LBBB (left bundle branch block)    chronic   Meniere's disease    Nonischemic cardiomyopathy (HCC) 2006   Presence of permanent cardiac pacemaker    T wave oversensing 01/17/2014   Ulcerative colitis    Varicose veins    4 lazer  treatment each leg    Past Surgical History:  Procedure Laterality Date   ABDOMINAL HYSTERECTOMY     BIV PACEMAKER GENERATOR CHANGEOUT N/A 08/30/2022   Procedure: BIV PACEMAKER GENERATOR CHANGEOUT;  Surgeon: Duke Salvia, MD;  Location: [facility] INVASIVE CV LAB;  Service: Cardiovascular;  Laterality: N/A;   BREAST  LUMPECTOMY WITH RADIOACTIVE SEED LOCALIZATION Right 01/05/2023   Procedure: RIGHT BREAST BRACKETED LUMPECTOMY WITH RADIOACTIVE SEED LOCALIZATION;  Surgeon: Abigail Miyamoto, MD;  Location: [facility] OR;  Service: General;  Laterality: Right;   CARDIAC CATHETERIZATION  08/2011   CHOLECYSTECTOMY     COLONOSCOPY     EP Study  08/22/08   IMPLANTABLE CARDIOVERTER DEFIBRILLATOR GENERATOR CHANGE N/A 08/22/2013   Procedure: IMPLANTABLE CARDIOVERTER DEFIBRILLATOR GENERATOR CHANGE;  Surgeon: Duke Salvia, MD;  Location: [facility] CATH LAB;  Service: Cardiovascular;  Laterality: N/A;   KNEE ARTHROSCOPY Left 04/14/2016   Procedure: LEFT KNEE ARTHROSCOPY, PARTIAL MEDIAL  AND PARTIAL LATERAL MENISCECTOMY, MEDIAL PLICA;  Surgeon: Jodi Geralds, MD;  Location: [facility] OR;  Service: Orthopedics;  Laterality: Left;   mastoid sugery     in the setting of Meniere's disease   TONSILLECTOMY AND ADENOIDECTOMY      Allergies  Allergen Reactions   Carvedilol Other (See Comments)    Pt does not remember reaction  Other Reaction(s): Unknown   Digoxin Other (See Comments)    Dig toxicity   Vancomycin Other (See Comments)    ("Red  Syndrome") Pt states she turned red and was itching  Unknown  ("Red  Syndrome") Pt states she turned red and was itching    Unknown    ("Red  Syndrome") Pt states she turned red and was itching   Pseudoephedrine Other (See Comments)    Makes skin feel like its crawling  Makes skin feel like its crawling Unknown Makes skin feel like its crawling  Makes skin feel like its crawling Unknown  Makes skin feel like its crawling    Makes skin feel like its crawling Unknown Makes skin feel like its crawling    Makes skin feel like its crawling, Unknown    Makes skin feel like its crawling Unknown    Makes skin feel like its crawling    Allergies as of 06/09/2023       Reactions   Carvedilol Other (See Comments)   Pt does not remember reaction Other Reaction(s): Unknown   Digoxin Other (See Comments)   Dig toxicity   Vancomycin Other (See Comments)   ("Red  Syndrome") Pt states she turned red and was itching Unknown  ("Red  Syndrome") Pt states she turned red and was itching    Unknown    ("Red  Syndrome") Pt states she turned red and was itching   Pseudoephedrine Other (See Comments)   Makes skin feel like its crawling Makes skin feel like its crawling Unknown Makes skin feel like its crawling  Makes skin feel like its crawling Unknown  Makes skin feel like its crawling    Makes skin feel like its crawling Unknown Makes skin feel like its crawling    Makes skin feel like its crawling, Unknown    Makes skin feel  like its crawling Unknown    Makes skin feel like its crawling        Medication List        Accurate as of June 09, 2023 11:59 PM. If you have any questions, ask your nurse or doctor.          acetaminophen 500 MG tablet Commonly known as: TYLENOL Take 500 mg by mouth every 8 (eight) hours as needed for headache.   alendronate 70 MG tablet Commonly known as: FOSAMAX Take 1 tablet (70 mg total) by mouth every 7 (seven) days. Take with a full glass of water on an empty stomach.  aspirin EC 81 MG tablet Take 81 mg by mouth daily. Swallow whole.   atorvastatin 40 MG tablet Commonly known as: LIPITOR Take 40 mg by mouth daily.   Calcium 600 600 MG Tabs tablet Generic drug: calcium carbonate Take 600 mg by mouth 2 (two) times daily with a meal.   Cranberry 500 MG Caps Take 2 tablets by mouth daily.   Dexcom G6 Receiver Devi 1 Device by Does not apply route as directed.   Dexcom G6 Sensor Misc 1 Device by Does not apply route as directed.   Dexcom G6 Transmitter Misc 1 Device by Does not apply route as directed.   dicyclomine 10 MG capsule Commonly known as: BENTYL TAKE 1 CAPSULE BY MOUTH THREE TIMES DAILY 30 MINUTES BEFORE MEALS   furosemide 20 MG tablet Commonly known as: LASIX Take 1 tablet (20 mg total) by mouth daily. alternating (40 mg) every other day.   insulin aspart protamine - aspart (70-30) 100 UNIT/ML FlexPen Commonly known as: NOVOLOG 70/30 MIX Inject 25 Units into the skin 3 (three) times daily before meals. Give if blood sugar > 151. Hold for CBG <= 150   loperamide 2 MG tablet Commonly known as: IMODIUM A-D Take 2 mg by mouth as needed for diarrhea or loose stools.   metoprolol succinate 100 MG 24 hr tablet Commonly known as: TOPROL-XL Take 100 mg by mouth 2 (two) times daily.   olmesartan 40 MG tablet Commonly known as: BENICAR Take 40 mg by mouth daily.   Paxlovid (300/100) 20 x 150 MG & 10 x 100MG  Tbpk Generic drug: nirmatrelvir  & ritonavir Take 3 tablets by mouth 2 (two) times daily for 5 days.   potassium chloride 10 MEQ tablet Commonly known as: KLOR-CON M Take 10 mEq by mouth 2 (two) times daily.   vitamin D3 50 MCG (2000 UT) Caps Take 2 capsules by mouth daily in the afternoon.        Review of Systems:  Review of Systems  Constitutional:  Negative for appetite change, fatigue and fever.  HENT:  Positive for hearing loss. Negative for congestion and trouble swallowing.   Eyes:  Negative for visual disturbance.  Respiratory:  Negative for cough, chest tightness and wheezing.        Residual hacking cough   Cardiovascular:  Positive for leg swelling.  Gastrointestinal:  Negative for abdominal pain, constipation and diarrhea.  Genitourinary:  Negative for dysuria and urgency.  Musculoskeletal:  Positive for arthralgias, back pain and gait problem.  Skin:  Positive for rash. Negative for color change.  Neurological:  Negative for tremors and headaches.  Psychiatric/Behavioral:  Negative for behavioral problems and sleep disturbance. The patient is not nervous/anxious.     Health Maintenance  Topic Date Due   FOOT EXAM  Never done   OPHTHALMOLOGY EXAM  Never done   COVID-19 Vaccine (6 - 2023-24 season) 07/02/2022   Zoster Vaccines- Shingrix (2 of 2) 05/02/2023   INFLUENZA VACCINE  06/02/2023   HEMOGLOBIN A1C  08/27/2023   Medicare Annual Wellness (AWV)  03/01/2024   DTaP/Tdap/Td (2 - Td or Tdap) 09/29/2032   Pneumonia Vaccine 35+ Years old  Completed   DEXA SCAN  Completed   HPV VACCINES  Aged Out    Physical Exam: Vitals:   06/08/23 1002  BP: 132/84  Pulse: 91  Temp: 98.2 F (36.8 C)  TempSrc: Temporal  SpO2: 97%  Weight: 134 lb (60.8 kg)  Height: 5\' 6"  (1.676 m)   Body mass  index is 21.63 kg/m. Physical Exam Vitals and nursing note reviewed.  Constitutional:      Appearance: Normal appearance.  HENT:     Head: Normocephalic and atraumatic.     Nose: Nose normal.      Mouth/Throat:     Mouth: Mucous membranes are moist.  Eyes:     Extraocular Movements: Extraocular movements intact.     Conjunctiva/sclera: Conjunctivae normal.     Pupils: Pupils are equal, round, and reactive to light.  Cardiovascular:     Rate and Rhythm: Normal rate and regular rhythm.     Heart sounds: No murmur heard.    Comments: Pacemaker left upper chest(defibrillator was removed per patient's request in the past) Pulmonary:     Effort: Pulmonary effort is normal.     Breath sounds: No rales.  Abdominal:     General: Bowel sounds are normal.     Palpations: Abdomen is soft.     Tenderness: There is no abdominal tenderness.  Musculoskeletal:        General: Tenderness present.     Cervical back: Normal range of motion and neck supple.     Right lower leg: Edema present.     Left lower leg: Edema present.     Comments: Lower back, pelvic/thigh pain with movement/weight bearing. Trace edema BLE  Skin:    General: Skin is warm and dry.     Findings: Rash present.     Comments: S/p R breat cancer lumpectomy, no apparent edema R arm.   Radiation dermatitis right upper chest/breast, no s/s of infection.    Neurological:     General: No focal deficit present.     Mental Status: She is alert and oriented to person, place, and time. Mental status is at baseline.     Motor: No weakness.     Gait: Gait abnormal.  Psychiatric:        Mood and Affect: Mood normal.        Behavior: Behavior normal.        Thought Content: Thought content normal.     Labs reviewed: Basic Metabolic Panel: Recent Labs    08/02/22 1559 12/08/22 0801 12/31/22 1430 02/25/23 0000  NA 144 140 140 143  K 3.8 3.3* 3.6 3.7  CL 100 102 101 104  CO2 26 27 26  29*  GLUCOSE 124* 185* 82  --   BUN 15 15 13 17   CREATININE 0.89 1.02* 0.94 0.8  CALCIUM 9.2 9.0 10.0 9.0  TSH  --   --   --  1.93   Liver Function Tests: Recent Labs    12/08/22 0801 02/25/23 0000  AST 19 16  ALT 13 10  ALKPHOS  67 62  BILITOT 0.8  --   PROT 6.6  --   ALBUMIN 4.0 3.8   No results for input(s): "LIPASE", "AMYLASE" in the last 8760 hours. No results for input(s): "AMMONIA" in the last 8760 hours. CBC: Recent Labs    08/02/22 1559 12/08/22 0801 12/31/22 1430 02/25/23 0000  WBC 7.2 5.9 7.0 5.3  NEUTROABS  --  4.4  --  3,546.00  HGB 13.8 14.5 14.7 12.9  HCT 40.3 42.2 43.0 38  MCV 95 93.6 95.3  --   PLT 145* 153 178 179   Lipid Panel: Recent Labs    02/25/23 0000  CHOL 116  HDL 35  LDLCALC 58  TRIG 144   Lab Results  Component Value Date   HGBA1C 7.5 02/25/2023  Procedures since last visit: CUP PACEART REMOTE DEVICE CHECK  Result Date: 06/01/2023 Scheduled remote reviewed. Normal device function.  Since last scheduled remote 5/1 there have been 6 NSVT, previous reports 9-14 beats 2 events 7/10, 16 & 31 beats @ 19:48 Next remote 91 days. LA, CVRS   Assessment/Plan COVID-19 virus infection COVID + 06/06/23, presented with cold symptoms, Plaxlovid started, resolved running nose and sore throat, denied cough, SOB, phlegm production, or chest pain, her appetite and energy level at her baseline.   Type 2 diabetes mellitus with renal complication (HCC) T2DM/renal complication, taking insulin 70/30, 03/04/23 hypoglycemic episode(CBG 47) in pm, stabilized after Glucagon 1mg  IM, reduced Aspart 70/30 to 25u if CBG >150, prn noon, Hgb A1c 7.5 02/24/23  SYSTOLIC HEART FAILURE, CHRONIC taking Furosemide, Bun/creat 17/0.8 02/24/23  HTN (hypertension) Blood pressure is controlled,  taking Metoprolol, Olmesartan  Ductal carcinoma in situ (DCIS) of right breast S/p R breat cancer lumpectomy, f/u surgeon, 03/15/23 cancer center radiation tx             Radiation dermatitis right upper chest/breast, no s/s of infection.     Labs/tests ordered:  none  Next appt:  10/06/2023

## 2023-06-09 NOTE — Assessment & Plan Note (Addendum)
COVID + 06/06/23, presented with cold symptoms, Plaxlovid started, resolved running nose and sore throat, denied cough, SOB, phlegm production, or chest pain, her appetite and energy level at her baseline.

## 2023-06-09 NOTE — Assessment & Plan Note (Signed)
S/p R breat cancer lumpectomy, f/u surgeon, 03/15/23 cancer center radiation tx  Radiation dermatitis right upper chest/breast, no s/s of infection.

## 2023-06-09 NOTE — Assessment & Plan Note (Signed)
T2DM/renal complication, taking insulin 70/30, 03/04/23 hypoglycemic episode(CBG 47) in pm, stabilized after Glucagon 1mg  IM, reduced Aspart 70/30 to 25u if CBG >150, prn noon, Hgb A1c 7.5 02/24/23

## 2023-06-10 ENCOUNTER — Encounter: Payer: Self-pay | Admitting: Nurse Practitioner

## 2023-06-13 ENCOUNTER — Ambulatory Visit: Payer: Medicare Other | Attending: Internal Medicine

## 2023-06-13 DIAGNOSIS — I5022 Chronic systolic (congestive) heart failure: Secondary | ICD-10-CM | POA: Diagnosis not present

## 2023-06-13 DIAGNOSIS — Z95810 Presence of automatic (implantable) cardiac defibrillator: Secondary | ICD-10-CM | POA: Diagnosis not present

## 2023-06-15 NOTE — Progress Notes (Signed)
EPIC Encounter for ICM Monitoring  Patient Name: Grace Robinson is a 89 y.o. female Date: 06/15/2023 Primary Care Physican: Mast, Man X, NP Primary Cardiologist: Graciela Husbands Electrophysiologist: Joycelyn Schmid Pacing:  97.4%     12/24/2022 Weight:  130 lbs   Clinical Status Since 31-May-2023 Time in AT/AF  <0.1 hr/day (<0.1%)            Spoke with patient and heart failure questions reviewed.  Transmission results reviewed.  Pt asymptomatic for fluid accumulation.  She is getting over COVID and feeling better.  She has had pelvic fractures, breast surgery for cancer and radiation since I last spoke with her.  She is doing fairly well considering all the recent medical conditions she has had over the last couple of months.     Optivol Thoracic impedance suggesting normal fluid levels.       Prescribed:  Furosemide 20 mg take 1 tablet (20 mg total) by mouth daily alternating (40 mg) every other day.   Potassium 10 mEq 1 tablet twice a day.   Labs: 02/25/2023 Creatinine 0.8    BUN 17,  Potassium 3.7, Sodium 143 12/31/2022 Creatinine 0.94, BUN 13, Potassium 3.6, Sodium 140, GFR 56 A complete set of results can be found in Results Review.   Recommendations:  No changes and encouraged to call if experiencing any fluid symptoms.   Follow-up plan: ICM clinic phone appointment on 07/18/2023.   91 day device clinic remote transmission 08/31/2023.      EP/Cardiology Office Visits:  Message sent to EP scheduling to call patient for appt with Dr Graciela Husbands.   Recall 09/03/2023 with Dr Graciela Husbands.     Copy of ICM check sent to Dr. Graciela Husbands.    3 month ICM trend: 06/13/2023.    12-14 Month ICM trend:     Karie Soda, RN 06/15/2023 3:50 PM

## 2023-06-15 NOTE — Progress Notes (Signed)
Remote pacemaker transmission.   

## 2023-06-16 DIAGNOSIS — M6281 Muscle weakness (generalized): Secondary | ICD-10-CM | POA: Diagnosis not present

## 2023-06-16 DIAGNOSIS — R2681 Unsteadiness on feet: Secondary | ICD-10-CM | POA: Diagnosis not present

## 2023-06-23 DIAGNOSIS — M6281 Muscle weakness (generalized): Secondary | ICD-10-CM | POA: Diagnosis not present

## 2023-06-23 DIAGNOSIS — R2681 Unsteadiness on feet: Secondary | ICD-10-CM | POA: Diagnosis not present

## 2023-06-27 DIAGNOSIS — R2681 Unsteadiness on feet: Secondary | ICD-10-CM | POA: Diagnosis not present

## 2023-06-27 DIAGNOSIS — M6281 Muscle weakness (generalized): Secondary | ICD-10-CM | POA: Diagnosis not present

## 2023-07-05 DIAGNOSIS — E119 Type 2 diabetes mellitus without complications: Secondary | ICD-10-CM | POA: Diagnosis not present

## 2023-07-07 DIAGNOSIS — R2681 Unsteadiness on feet: Secondary | ICD-10-CM | POA: Diagnosis not present

## 2023-07-07 DIAGNOSIS — R41841 Cognitive communication deficit: Secondary | ICD-10-CM | POA: Diagnosis not present

## 2023-07-07 DIAGNOSIS — M6281 Muscle weakness (generalized): Secondary | ICD-10-CM | POA: Diagnosis not present

## 2023-07-08 ENCOUNTER — Encounter: Payer: Self-pay | Admitting: Sports Medicine

## 2023-07-08 ENCOUNTER — Non-Acute Institutional Stay (INDEPENDENT_AMBULATORY_CARE_PROVIDER_SITE_OTHER): Payer: Medicare Other | Admitting: Sports Medicine

## 2023-07-08 ENCOUNTER — Telehealth: Payer: Self-pay

## 2023-07-08 ENCOUNTER — Other Ambulatory Visit: Payer: Self-pay

## 2023-07-08 VITALS — BP 132/80 | HR 68 | Temp 97.7°F | Resp 17 | Ht 66.0 in | Wt 136.0 lb

## 2023-07-08 DIAGNOSIS — E1122 Type 2 diabetes mellitus with diabetic chronic kidney disease: Secondary | ICD-10-CM

## 2023-07-08 DIAGNOSIS — R0981 Nasal congestion: Secondary | ICD-10-CM

## 2023-07-08 DIAGNOSIS — Z794 Long term (current) use of insulin: Secondary | ICD-10-CM

## 2023-07-08 DIAGNOSIS — U071 COVID-19: Secondary | ICD-10-CM | POA: Diagnosis not present

## 2023-07-08 DIAGNOSIS — N183 Chronic kidney disease, stage 3 unspecified: Secondary | ICD-10-CM

## 2023-07-08 DIAGNOSIS — R350 Frequency of micturition: Secondary | ICD-10-CM | POA: Diagnosis not present

## 2023-07-08 DIAGNOSIS — R051 Acute cough: Secondary | ICD-10-CM

## 2023-07-08 MED ORDER — FLUTICASONE PROPIONATE 50 MCG/ACT NA SUSP: 2.0000 | Freq: Every day | NASAL | 6 refills | Status: DC

## 2023-07-08 MED ORDER — BENZONATATE 200 MG PO CAPS: 200.0000 mg | ORAL_CAPSULE | Freq: Two times a day (BID) | ORAL | 0 refills | Status: DC | PRN

## 2023-07-08 MED ORDER — INSULIN DEGLUDEC 100 UNIT/ML ~~LOC~~ SOPN: 50.0000 [IU] | PEN_INJECTOR | Freq: Every day | SUBCUTANEOUS | 3 refills | Status: DC

## 2023-07-08 MED ORDER — LORATADINE 10 MG PO TABS: 10.0000 mg | ORAL_TABLET | Freq: Every day | ORAL | 3 refills | Status: DC

## 2023-07-08 NOTE — Progress Notes (Signed)
Careteam: Patient Care Team: Mast, Man X, NP as PCP - General (Internal Medicine) Abigail Miyamoto, MD as Consulting Physician (General Surgery) Rachel Moulds, MD as Consulting Physician (Hematology and Oncology) Dorothy Puffer, MD as Consulting Physician (Radiation Oncology)  PLACE OF SERVICE:  [facility]  Advanced Directive information    Allergies  Allergen Reactions   Carvedilol Other (See Comments)    Pt does not remember reaction  Other Reaction(s): Unknown   Digoxin Other (See Comments)    Dig toxicity   Vancomycin Other (See Comments)    ("Red Man Syndrome") Pt states she turned red and was itching  Unknown  ("Red Man Syndrome") Pt states she turned red and was itching    Unknown    ("Red Man Syndrome") Pt states she turned red and was itching   Pseudoephedrine Other (See Comments)    Makes skin feel like its crawling  Makes skin feel like its crawling Unknown Makes skin feel like its crawling  Makes skin feel like its crawling Unknown  Makes skin feel like its crawling    Makes skin feel like its crawling Unknown Makes skin feel like its crawling    Makes skin feel like its crawling, Unknown    Makes skin feel like its crawling Unknown    Makes skin feel like its crawling    Chief Complaint  Patient presents with   Acute Visit    Patient would like to be seen for blood sugar and diabetes. And follow up from covid     Immunizations    Patient is due for flu ,covid anf shingles vaccine    Health Maintenance    Patient is due for foot and eye evam      HPI: Patient is a 89 y.o. female presented to clinic for acute visit   DM  Lab Results  Component Value Date   HGBA1C 7.5 02/25/2023   HGBA1C 7.5 (H) 12/31/2022   HGBA1C 7.4 (H) 11/10/2020   She is on 70/30 mix, pt states that  she needs check her BG 3-4 times daily and adjust her insulin based on readings  Reports that some times she does not know how much to inject  Reports hypoglycemia   Had 1 episode of 70 yesterday    Recent covid C/o dry cough  Feels her  ears are stuffed up  Denies ear pain or discharge  Denies sinus pain  C/o nasal congestion   Increased urinary frequency  since 4 days Has lower back pain  Urgency+  Hesitancy + Denies lower abdominal pain, nausea, vomiting, dysuria She is drinking cranberry juice and lots of water     Review of Systems:  Review of Systems  Constitutional:  Negative for chills and fever.  HENT:  Positive for congestion and tinnitus. Negative for ear discharge, ear pain and sinus pain.   Eyes:  Negative for double vision.  Respiratory:  Positive for cough. Negative for sputum production and shortness of breath.   Cardiovascular:  Negative for chest pain, palpitations and leg swelling.  Gastrointestinal:  Negative for abdominal pain, heartburn and nausea.  Genitourinary:  Negative for dysuria, frequency and hematuria.  Musculoskeletal:  Negative for falls and myalgias.  Neurological:  Negative for dizziness, sensory change and focal weakness.    Past Medical History:  Diagnosis Date   Biventricular ICD (implantable cardiac defibrillator) in place    downgraded from CRT-D to CRT-P 08/30/22   Breast cancer (HCC)    Cellulitis  Change in bowel habits    CHF (congestive heart failure) (HCC)    Colon polyps    Diabetes mellitus    Type II   Diarrhea    Dyslipidemia    HOH (hard of hearing)    left   HTN (hypertension)    LBBB (left bundle branch block)    chronic   Meniere's disease    Nonischemic cardiomyopathy (HCC) 2006   Presence of permanent cardiac pacemaker    T wave oversensing 01/17/2014   Ulcerative colitis    Varicose veins    4 lazer  treatment each leg   Past Surgical History:  Procedure Laterality Date   ABDOMINAL HYSTERECTOMY     BIV PACEMAKER GENERATOR CHANGEOUT N/A 08/30/2022   Procedure: BIV PACEMAKER GENERATOR CHANGEOUT;  Surgeon: Duke Salvia, MD;  Location: [facility] INVASIVE CV LAB;   Service: Cardiovascular;  Laterality: N/A;   BREAST LUMPECTOMY WITH RADIOACTIVE SEED LOCALIZATION Right 01/05/2023   Procedure: RIGHT BREAST BRACKETED LUMPECTOMY WITH RADIOACTIVE SEED LOCALIZATION;  Surgeon: Abigail Miyamoto, MD;  Location: [facility] OR;  Service: General;  Laterality: Right;   CARDIAC CATHETERIZATION  08/2011   CHOLECYSTECTOMY     COLONOSCOPY     EP Study  08/22/08   IMPLANTABLE CARDIOVERTER DEFIBRILLATOR GENERATOR CHANGE N/A 08/22/2013   Procedure: IMPLANTABLE CARDIOVERTER DEFIBRILLATOR GENERATOR CHANGE;  Surgeon: Duke Salvia, MD;  Location: [facility] CATH LAB;  Service: Cardiovascular;  Laterality: N/A;   KNEE ARTHROSCOPY Left 04/14/2016   Procedure: LEFT KNEE ARTHROSCOPY, PARTIAL MEDIAL AND PARTIAL LATERAL MENISCECTOMY, MEDIAL PLICA;  Surgeon: Jodi Geralds, MD;  Location: [facility] OR;  Service: Orthopedics;  Laterality: Left;   mastoid sugery     in the setting of Meniere's disease   TONSILLECTOMY AND ADENOIDECTOMY     Social History:   reports that she has quit smoking. Her smoking use included cigarettes. She has never been exposed to tobacco smoke. She has never used smokeless tobacco. She reports that she does not drink alcohol and does not use drugs.  Family History  Problem Relation Age of Onset   Breast cancer Mother 66       met to lungs   Emphysema Father 12   Heart disease Father    Healthy Child    Lung cancer Cousin        maternal first cousin, she smoked   Colon cancer Neg Hx    Rectal cancer Neg Hx    Stomach cancer Neg Hx    Liver cancer Neg Hx    Esophageal cancer Neg Hx     Medications: Patient's Medications  New Prescriptions   No medications on file  Previous Medications   ACETAMINOPHEN (TYLENOL) 500 MG TABLET    Take 500 mg by mouth every 8 (eight) hours as needed for headache.   ALENDRONATE (FOSAMAX) 70 MG TABLET    Take 1 tablet (70 mg total) by mouth every 7 (seven) days. Take with a full glass of water on an empty stomach.   ASPIRIN EC 81 MG TABLET     Take 81 mg by mouth daily. Swallow whole.   ATORVASTATIN (LIPITOR) 40 MG TABLET    Take 40 mg by mouth daily.   CALCIUM CARBONATE (CALCIUM 600) 600 MG TABS TABLET    Take 600 mg by mouth 2 (two) times daily with a meal.   CHOLECALCIFEROL (VITAMIN D3) 50 MCG (2000 UT) CAPS    Take 2 capsules by mouth daily in the afternoon.   CONTINUOUS GLUCOSE RECEIVER (DEXCOM G6  RECEIVER) DEVI    1 Device by Does not apply route as directed.   CONTINUOUS GLUCOSE SENSOR (DEXCOM G6 SENSOR) MISC    1 Device by Does not apply route as directed.   CONTINUOUS GLUCOSE TRANSMITTER (DEXCOM G6 TRANSMITTER) MISC    1 Device by Does not apply route as directed.   CRANBERRY 500 MG CAPS    Take 2 tablets by mouth daily.   DICYCLOMINE (BENTYL) 10 MG CAPSULE    TAKE 1 CAPSULE BY MOUTH THREE TIMES DAILY 30 MINUTES BEFORE MEALS   FUROSEMIDE (LASIX) 20 MG TABLET    Take 1 tablet (20 mg total) by mouth daily. alternating (40 mg) every other day.   INSULIN ASPART PROTAMINE - ASPART (NOVOLOG 70/30 MIX) (70-30) 100 UNIT/ML FLEXPEN    Inject 25 Units into the skin 3 (three) times daily before meals. Give if blood sugar > 151. Hold for CBG <= 150   LOPERAMIDE (IMODIUM A-D) 2 MG TABLET    Take 2 mg by mouth as needed for diarrhea or loose stools.   METOPROLOL SUCCINATE (TOPROL-XL) 100 MG 24 HR TABLET    Take 100 mg by mouth 2 (two) times daily.   OLMESARTAN (BENICAR) 40 MG TABLET    Take 40 mg by mouth daily.   POTASSIUM CHLORIDE (K-DUR) 10 MEQ TABLET    Take 10 mEq by mouth 2 (two) times daily.  Modified Medications   No medications on file  Discontinued Medications   No medications on file    Physical Exam:  Vitals:   07/08/23 1033  BP: 132/80  Pulse: 68  Resp: 17  Temp: 97.7 F (36.5 C)  TempSrc: Temporal  SpO2: 96%  Weight: 136 lb (61.7 kg)  Height: 5\' 6"  (1.676 m)   Body mass index is 21.95 kg/m. Wt Readings from Last 3 Encounters:  07/08/23 136 lb (61.7 kg)  06/08/23 134 lb (60.8 kg)  06/02/23 137 lb (62.1 kg)     Physical Exam Constitutional:      Appearance: Normal appearance.  HENT:     Head: Normocephalic and atraumatic.  Cardiovascular:     Rate and Rhythm: Normal rate and regular rhythm.     Heart sounds: No murmur heard. Pulmonary:     Effort: Pulmonary effort is normal. No respiratory distress.     Breath sounds: Normal breath sounds. No wheezing.  Abdominal:     General: Bowel sounds are normal. There is no distension.     Tenderness: There is no abdominal tenderness. There is no guarding or rebound.  Musculoskeletal:        General: No swelling or tenderness.  Skin:    General: Skin is dry.  Neurological:     Mental Status: She is alert. Mental status is at baseline.     Sensory: No sensory deficit.     Motor: No weakness.     Labs reviewed: Basic Metabolic Panel: Recent Labs    08/02/22 1559 12/08/22 0801 12/31/22 1430 02/25/23 0000  NA 144 140 140 143  K 3.8 3.3* 3.6 3.7  CL 100 102 101 104  CO2 26 27 26  29*  GLUCOSE 124* 185* 82  --   BUN 15 15 13 17   CREATININE 0.89 1.02* 0.94 0.8  CALCIUM 9.2 9.0 10.0 9.0  TSH  --   --   --  1.93   Liver Function Tests: Recent Labs    12/08/22 0801 02/25/23 0000  AST 19 16  ALT 13 10  ALKPHOS 67 62  BILITOT 0.8  --   PROT 6.6  --   ALBUMIN 4.0 3.8   No results for input(s): "LIPASE", "AMYLASE" in the last 8760 hours. No results for input(s): "AMMONIA" in the last 8760 hours. CBC: Recent Labs    08/02/22 1559 12/08/22 0801 12/31/22 1430 02/25/23 0000  WBC 7.2 5.9 7.0 5.3  NEUTROABS  --  4.4  --  3,546.00  HGB 13.8 14.5 14.7 12.9  HCT 40.3 42.2 43.0 38  MCV 95 93.6 95.3  --   PLT 145* 153 178 179   Lipid Panel: Recent Labs    02/25/23 0000  CHOL 116  HDL 35  LDLCALC 58  TRIG 144   TSH: Recent Labs    02/25/23 0000  TSH 1.93   A1C: Lab Results  Component Value Date   HGBA1C 7.5 02/25/2023     Assessment/Plan 1. Type 2 diabetes mellitus with stage 3 chronic kidney disease, with  long-term current use of insulin, unspecified whether stage 3a or 3b CKD (HCC) Lab Results  Component Value Date   HGBA1C 7.5 02/25/2023   HGBA1C 7.5 (H) 12/31/2022   HGBA1C 7.4 (H) 11/10/2020  Pt reports confusion about the dosing and its difficult to adjust dosing based on blood sugar readings  She is also having hypoglycemic episodes Will switch novolin 70/30 mix to Trojeo   Pt is taking any where from 50 to 75 units  Will start 80 % of total daily dosing of novolon 70/30  Will start Tresiba 50 units s/c  Instructed to start taking 50 units s/c from Monday to avoid any confusions on the weekend Spoke with independent living nurse and gave the instructions Will follow up in 1 week   2. COVID-19 virus infection Recent covid infection Still with post nasal drip and dry cough  Will send tessalon, flonase Lungs clear Does not appear to be in distress  3. Nasal congestion  - fluticasone (FLONASE) 50 MCG/ACT nasal spray; Place 2 sprays into both nostrils daily.  Dispense: 16 g; Refill: 6 - loratadine (CLARITIN) 10 MG tablet; Take 1 tablet (10 mg total) by mouth daily.  Dispense: 90 tablet; Refill: 3  4. Acute cough  - benzonatate (TESSALON) 200 MG capsule; Take 1 capsule (200 mg total) by mouth 2 (two) times daily as needed for cough.  Dispense: 20 capsule; Refill: 0  5. Frequent urination Will check UA , culture  - Culture, Urine - Urinalysis  Other orders - insulin degludec (TRESIBA) 100 UNIT/ML FlexTouch Pen; Inject 50 Units into the skin daily. Please dispense for 90 day supply  Dispense: 9 mL; Refill: 3  No follow-ups on file.:

## 2023-07-08 NOTE — Patient Instructions (Addendum)
Take 50 units s/c daily  Stop novolin mix 70/30

## 2023-07-08 NOTE — Telephone Encounter (Signed)
Prior authorization received from covermymeds for Tresiba Flex touch 100/unit/ml pen injectors. Prior authorization started through covermymeds and waiting on reply

## 2023-07-11 DIAGNOSIS — R2681 Unsteadiness on feet: Secondary | ICD-10-CM | POA: Diagnosis not present

## 2023-07-11 DIAGNOSIS — M6281 Muscle weakness (generalized): Secondary | ICD-10-CM | POA: Diagnosis not present

## 2023-07-11 DIAGNOSIS — R41841 Cognitive communication deficit: Secondary | ICD-10-CM | POA: Diagnosis not present

## 2023-07-12 ENCOUNTER — Telehealth: Payer: Self-pay

## 2023-07-12 NOTE — Telephone Encounter (Signed)
Incoming fax received from patients pharmacy to initiate a prior authorization for                   .  PA initiated through covermymeds. Key: [id number]  Awaiting reply from the insurance company which will be determined in 48-72 hours.

## 2023-07-14 NOTE — Telephone Encounter (Signed)
Additional clinical questions answered via covermymeds, [employer] is processing information. Awaiting on determination response

## 2023-07-15 ENCOUNTER — Encounter: Payer: Medicare Other | Admitting: Sports Medicine

## 2023-07-15 NOTE — Telephone Encounter (Signed)
Fax received from [facility] for Prior Authorization form Evaristo Bury and PA was DENIED stating that Non Formulary Exception criteria requirements have not been met.  Alternative medication that must be tried is Lantus or Toujeo.   Denial sent to Dr. Jacquenette Shone

## 2023-07-18 ENCOUNTER — Ambulatory Visit: Payer: Medicare Other | Attending: Internal Medicine

## 2023-07-18 DIAGNOSIS — M6281 Muscle weakness (generalized): Secondary | ICD-10-CM | POA: Diagnosis not present

## 2023-07-18 DIAGNOSIS — Z95810 Presence of automatic (implantable) cardiac defibrillator: Secondary | ICD-10-CM | POA: Diagnosis not present

## 2023-07-18 DIAGNOSIS — R41841 Cognitive communication deficit: Secondary | ICD-10-CM | POA: Diagnosis not present

## 2023-07-18 DIAGNOSIS — I5022 Chronic systolic (congestive) heart failure: Secondary | ICD-10-CM | POA: Diagnosis not present

## 2023-07-18 DIAGNOSIS — R2681 Unsteadiness on feet: Secondary | ICD-10-CM | POA: Diagnosis not present

## 2023-07-19 ENCOUNTER — Telehealth: Payer: Self-pay | Admitting: Sports Medicine

## 2023-07-19 MED ORDER — TOUJEO MAX SOLOSTAR 300 UNIT/ML ~~LOC~~ SOPN: 50.0000 [IU] | PEN_INJECTOR | Freq: Every day | SUBCUTANEOUS | 3 refills | Status: DC

## 2023-07-19 NOTE — Telephone Encounter (Signed)
See message received from Dr.Veludandi:   Grace Sheffield, MD  [facility] Clinical Pool16 minutes ago (12:46 PM)    Plz call patient and inform that tresiba is not covered by her insurance, will change to Cherene Altes to pharmacy   Patient is aware and verbalized understanding

## 2023-07-21 ENCOUNTER — Telehealth: Payer: Self-pay

## 2023-07-21 DIAGNOSIS — R41841 Cognitive communication deficit: Secondary | ICD-10-CM | POA: Diagnosis not present

## 2023-07-21 DIAGNOSIS — M6281 Muscle weakness (generalized): Secondary | ICD-10-CM | POA: Diagnosis not present

## 2023-07-21 DIAGNOSIS — R2681 Unsteadiness on feet: Secondary | ICD-10-CM | POA: Diagnosis not present

## 2023-07-21 NOTE — Telephone Encounter (Signed)
Remote ICM transmission received.  Attempted call to patient regarding ICM remote transmission and left detailed message per DPR.  Left ICM phone number and advised to return call for any fluid symptoms or questions. Next ICM remote transmission scheduled 08/22/2023.

## 2023-07-21 NOTE — Progress Notes (Signed)
EPIC Encounter for ICM Monitoring  Patient Name: Grace Robinson is a 89 y.o. female Date: 07/21/2023 Primary Care Physican: Mast, Man X, NP Primary Cardiologist: Graciela Husbands Electrophysiologist: Joycelyn Schmid Pacing:  98.1%     12/24/2022 Weight:  130 lbs   Clinical Status Since 14-Jun-2023 Time in AT/AF  <0.1 hr/day (<0.1%)            Attempted call to patient and unable to reach.  Left detailed message per DPR regarding transmission. Transmission reviewed.    Optivol Thoracic impedance suggesting normal fluid levels.       Prescribed:  Furosemide 20 mg take 1 tablet (20 mg total) by mouth daily alternating (40 mg) every other day.   Potassium 10 mEq 1 tablet twice a day.   Labs: 02/25/2023 Creatinine 0.8    BUN 17,  Potassium 3.7, Sodium 143 12/31/2022 Creatinine 0.94, BUN 13, Potassium 3.6, Sodium 140, GFR 56 A complete set of results can be found in Results Review.   Recommendations:  Left voice mail with ICM number and encouraged to call if experiencing any fluid symptoms.   Follow-up plan: ICM clinic phone appointment on 08/22/2023.   91 day device clinic remote transmission 08/31/2023.      EP/Cardiology Office Visits:  EP scheduling attempted calls to patient to make recall appt with Dr Graciela Husbands.   Recall 09/03/2023 with Dr Graciela Husbands.     Copy of ICM check sent to Dr. Graciela Husbands.    3 month ICM trend: 07/18/2023.    12-14 Month ICM trend:     Karie Soda, RN 07/21/2023 7:23 AM

## 2023-07-22 ENCOUNTER — Encounter: Payer: Self-pay | Admitting: Sports Medicine

## 2023-07-22 ENCOUNTER — Non-Acute Institutional Stay (INDEPENDENT_AMBULATORY_CARE_PROVIDER_SITE_OTHER): Payer: Medicare Other | Admitting: Sports Medicine

## 2023-07-25 ENCOUNTER — Encounter: Payer: Self-pay | Admitting: General Practice

## 2023-07-25 NOTE — Progress Notes (Signed)
This encounter was created in error - please disregard.

## 2023-07-26 DIAGNOSIS — R41841 Cognitive communication deficit: Secondary | ICD-10-CM | POA: Diagnosis not present

## 2023-07-26 DIAGNOSIS — M6281 Muscle weakness (generalized): Secondary | ICD-10-CM | POA: Diagnosis not present

## 2023-07-26 DIAGNOSIS — R2681 Unsteadiness on feet: Secondary | ICD-10-CM | POA: Diagnosis not present

## 2023-07-27 DIAGNOSIS — R2681 Unsteadiness on feet: Secondary | ICD-10-CM | POA: Diagnosis not present

## 2023-07-27 DIAGNOSIS — R41841 Cognitive communication deficit: Secondary | ICD-10-CM | POA: Diagnosis not present

## 2023-07-27 DIAGNOSIS — M6281 Muscle weakness (generalized): Secondary | ICD-10-CM | POA: Diagnosis not present

## 2023-07-28 DIAGNOSIS — R41841 Cognitive communication deficit: Secondary | ICD-10-CM | POA: Diagnosis not present

## 2023-07-28 DIAGNOSIS — M6281 Muscle weakness (generalized): Secondary | ICD-10-CM | POA: Diagnosis not present

## 2023-07-28 DIAGNOSIS — R2681 Unsteadiness on feet: Secondary | ICD-10-CM | POA: Diagnosis not present

## 2023-07-29 ENCOUNTER — Non-Acute Institutional Stay: Payer: Medicare Other | Admitting: Sports Medicine

## 2023-07-29 ENCOUNTER — Encounter: Payer: Self-pay | Admitting: Sports Medicine

## 2023-07-29 VITALS — BP 127/88 | HR 87 | Temp 96.6°F | Resp 18 | Ht 66.0 in | Wt 137.4 lb

## 2023-07-29 DIAGNOSIS — E1122 Type 2 diabetes mellitus with diabetic chronic kidney disease: Secondary | ICD-10-CM | POA: Diagnosis not present

## 2023-07-29 DIAGNOSIS — Z794 Long term (current) use of insulin: Secondary | ICD-10-CM

## 2023-07-29 DIAGNOSIS — N183 Chronic kidney disease, stage 3 unspecified: Secondary | ICD-10-CM | POA: Diagnosis not present

## 2023-07-29 NOTE — Progress Notes (Signed)
Careteam: Patient Care Team: Mast, Man X, NP as PCP - General (Internal Medicine) Abigail Miyamoto, MD as Consulting Physician (General Surgery) Rachel Moulds, MD as Consulting Physician (Hematology and Oncology) Dorothy Puffer, MD as Consulting Physician (Radiation Oncology)  PLACE OF SERVICE:  [facility]  Advanced Directive information Does Patient Have a Medical Advance Directive?: Yes, Would patient like information on creating a medical advance directive?: No - Patient declined, Type of Advance Directive: Out of facility DNR (pink MOST or yellow form), Pre-existing out of facility DNR order (yellow form or pink MOST form): Yellow form placed in chart (order not valid for inpatient use), Does patient want to make changes to medical advance directive?: No - Patient declined  Allergies  Allergen Reactions   Carvedilol Other (See Comments)    Pt does not remember reaction  Other Reaction(s): Unknown   Digoxin Other (See Comments)    Dig toxicity   Vancomycin Other (See Comments)    ("Red Man Syndrome") Pt states she turned red and was itching  Unknown  ("Red Man Syndrome") Pt states she turned red and was itching    Unknown    ("Red Man Syndrome") Pt states she turned red and was itching   Pseudoephedrine Other (See Comments)    Makes skin feel like its crawling  Makes skin feel like its crawling Unknown Makes skin feel like its crawling  Makes skin feel like its crawling Unknown  Makes skin feel like its crawling    Makes skin feel like its crawling Unknown Makes skin feel like its crawling    Makes skin feel like its crawling, Unknown    Makes skin feel like its crawling Unknown    Makes skin feel like its crawling    Chief Complaint  Patient presents with   Follow-up    1 week f/u    Immunizations    Influenza, Covid and Shingrix   Quality Metric Gaps    Eye and foot exam      HPI: Patient is a 89 y.o. female  is here for follow up on her diabetes She  brought her blood sugar log , morning Bg around 80's  Reports 1 episode  of hypoglycemia with Bg in 50's  She eats her evening meal at 5 pm and when started noticed low blood sugars early in the morning She started eating cookie and drink milk before going to bed. Pt denies fevers, chills, cough, sob, abdominal pain, nausea, vomiting, dysuria, hematuria, bloody or dark stools.  Seasonal allergies  - doing better  On flonase, claritin    Review of Systems:  Review of Systems  Constitutional:  Negative for chills and fever.  HENT:  Negative for congestion and sore throat.   Eyes:  Negative for double vision.  Respiratory:  Negative for cough, sputum production and shortness of breath.   Cardiovascular:  Negative for chest pain, palpitations and leg swelling.  Gastrointestinal:  Negative for abdominal pain, heartburn and nausea.  Genitourinary:  Negative for dysuria, frequency and hematuria.  Musculoskeletal:  Negative for falls and myalgias.  Neurological:  Negative for dizziness, sensory change and focal weakness.    Past Medical History:  Diagnosis Date   Biventricular ICD (implantable cardiac defibrillator) in place    downgraded from CRT-D to CRT-P 08/30/22   Breast cancer (HCC)    Cellulitis    Change in bowel habits    CHF (congestive heart failure) (HCC)    Colon polyps    Diabetes mellitus  Type II   Diarrhea    Dyslipidemia    HOH (hard of hearing)    left   HTN (hypertension)    LBBB (left bundle branch block)    chronic   Meniere's disease    Nonischemic cardiomyopathy (HCC) 2006   Presence of permanent cardiac pacemaker    T wave oversensing 01/17/2014   Ulcerative colitis    Varicose veins    4 lazer  treatment each leg   Past Surgical History:  Procedure Laterality Date   ABDOMINAL HYSTERECTOMY     BIV PACEMAKER GENERATOR CHANGEOUT N/A 08/30/2022   Procedure: BIV PACEMAKER GENERATOR CHANGEOUT;  Surgeon: Duke Salvia, MD;  Location: [facility] INVASIVE CV  LAB;  Service: Cardiovascular;  Laterality: N/A;   BREAST LUMPECTOMY WITH RADIOACTIVE SEED LOCALIZATION Right 01/05/2023   Procedure: RIGHT BREAST BRACKETED LUMPECTOMY WITH RADIOACTIVE SEED LOCALIZATION;  Surgeon: Abigail Miyamoto, MD;  Location: [facility] OR;  Service: General;  Laterality: Right;   CARDIAC CATHETERIZATION  08/2011   CHOLECYSTECTOMY     COLONOSCOPY     EP Study  08/22/08   IMPLANTABLE CARDIOVERTER DEFIBRILLATOR GENERATOR CHANGE N/A 08/22/2013   Procedure: IMPLANTABLE CARDIOVERTER DEFIBRILLATOR GENERATOR CHANGE;  Surgeon: Duke Salvia, MD;  Location: [facility] CATH LAB;  Service: Cardiovascular;  Laterality: N/A;   KNEE ARTHROSCOPY Left 04/14/2016   Procedure: LEFT KNEE ARTHROSCOPY, PARTIAL MEDIAL AND PARTIAL LATERAL MENISCECTOMY, MEDIAL PLICA;  Surgeon: Jodi Geralds, MD;  Location: [facility] OR;  Service: Orthopedics;  Laterality: Left;   mastoid sugery     in the setting of Meniere's disease   TONSILLECTOMY AND ADENOIDECTOMY     Social History:   reports that she has quit smoking. Her smoking use included cigarettes. She has never been exposed to tobacco smoke. She has never used smokeless tobacco. She reports that she does not drink alcohol and does not use drugs.  Family History  Problem Relation Age of Onset   Breast cancer Mother 60       met to lungs   Emphysema Father 31   Heart disease Father    Healthy Child    Lung cancer Cousin        maternal first cousin, she smoked   Colon cancer Neg Hx    Rectal cancer Neg Hx    Stomach cancer Neg Hx    Liver cancer Neg Hx    Esophageal cancer Neg Hx     Medications: Patient's Medications  New Prescriptions   No medications on file  Previous Medications   ACETAMINOPHEN (TYLENOL) 500 MG TABLET    Take 500 mg by mouth every 8 (eight) hours as needed for headache.   ALENDRONATE (FOSAMAX) 70 MG TABLET    Take 1 tablet (70 mg total) by mouth every 7 (seven) days. Take with a full glass of water on an empty stomach.   ASPIRIN EC 81 MG  TABLET    Take 81 mg by mouth daily. Swallow whole.   ATORVASTATIN (LIPITOR) 40 MG TABLET    Take 40 mg by mouth daily.   BENZONATATE (TESSALON) 200 MG CAPSULE    Take 1 capsule (200 mg total) by mouth 2 (two) times daily as needed for cough.   CALCIUM CARBONATE (CALCIUM 600) 600 MG TABS TABLET    Take 600 mg by mouth 2 (two) times daily with a meal.   CHOLECALCIFEROL (VITAMIN D3) 50 MCG (2000 UT) CAPS    Take 2 capsules by mouth daily in the afternoon.   CONTINUOUS GLUCOSE RECEIVER (DEXCOM  G6 RECEIVER) DEVI    1 Device by Does not apply route as directed.   CONTINUOUS GLUCOSE SENSOR (DEXCOM G6 SENSOR) MISC    1 Device by Does not apply route as directed.   CONTINUOUS GLUCOSE TRANSMITTER (DEXCOM G6 TRANSMITTER) MISC    1 Device by Does not apply route as directed.   CRANBERRY 500 MG CAPS    Take 2 tablets by mouth daily.   DICYCLOMINE (BENTYL) 10 MG CAPSULE    TAKE 1 CAPSULE BY MOUTH THREE TIMES DAILY 30 MINUTES BEFORE MEALS   FLUTICASONE (FLONASE) 50 MCG/ACT NASAL SPRAY    Place 2 sprays into both nostrils daily.   FUROSEMIDE (LASIX) 20 MG TABLET    Take 1 tablet (20 mg total) by mouth daily. alternating (40 mg) every other day.   INSULIN GLARGINE, 2 UNIT DIAL, (TOUJEO MAX SOLOSTAR) 300 UNIT/ML SOLOSTAR PEN    Inject 50 Units into the skin daily.   LOPERAMIDE (IMODIUM A-D) 2 MG TABLET    Take 2 mg by mouth as needed for diarrhea or loose stools.   LORATADINE (CLARITIN) 10 MG TABLET    Take 1 tablet (10 mg total) by mouth daily.   METOPROLOL SUCCINATE (TOPROL-XL) 100 MG 24 HR TABLET    Take 100 mg by mouth 2 (two) times daily.   OLMESARTAN (BENICAR) 40 MG TABLET    Take 40 mg by mouth daily.   POTASSIUM CHLORIDE (K-DUR) 10 MEQ TABLET    Take 10 mEq by mouth 2 (two) times daily.  Modified Medications   No medications on file  Discontinued Medications   No medications on file    Physical Exam:  Vitals:   07/29/23 0954  BP: 127/88  Pulse: 87  Resp: 18  Temp: (!) 96.6 F (35.9 C)  SpO2:  95%  Weight: 137 lb 6.4 oz (62.3 kg)  Height: 5\' 6"  (1.676 m)   Body mass index is 22.18 kg/m. Wt Readings from Last 3 Encounters:  07/29/23 137 lb 6.4 oz (62.3 kg)  07/08/23 136 lb (61.7 kg)  06/08/23 134 lb (60.8 kg)    Physical Exam Constitutional:      Appearance: Normal appearance.  HENT:     Head: Normocephalic and atraumatic.  Cardiovascular:     Rate and Rhythm: Normal rate and regular rhythm.     Heart sounds: No murmur heard. Pulmonary:     Effort: Pulmonary effort is normal. No respiratory distress.     Breath sounds: Normal breath sounds. No wheezing.  Abdominal:     General: Bowel sounds are normal. There is no distension.     Tenderness: There is no abdominal tenderness. There is no guarding or rebound.  Musculoskeletal:        General: No swelling or tenderness.  Skin:    General: Skin is dry.  Neurological:     Mental Status: She is alert. Mental status is at baseline.     Sensory: No sensory deficit.     Motor: No weakness.     Labs reviewed: Basic Metabolic Panel: Recent Labs    08/02/22 1559 12/08/22 0801 12/31/22 1430 02/25/23 0000  NA 144 140 140 143  K 3.8 3.3* 3.6 3.7  CL 100 102 101 104  CO2 26 27 26  29*  GLUCOSE 124* 185* 82  --   BUN 15 15 13 17   CREATININE 0.89 1.02* 0.94 0.8  CALCIUM 9.2 9.0 10.0 9.0  TSH  --   --   --  1.93  Liver Function Tests: Recent Labs    12/08/22 0801 02/25/23 0000  AST 19 16  ALT 13 10  ALKPHOS 67 62  BILITOT 0.8  --   PROT 6.6  --   ALBUMIN 4.0 3.8   No results for input(s): "LIPASE", "AMYLASE" in the last 8760 hours. No results for input(s): "AMMONIA" in the last 8760 hours. CBC: Recent Labs    08/02/22 1559 12/08/22 0801 12/31/22 1430 02/25/23 0000  WBC 7.2 5.9 7.0 5.3  NEUTROABS  --  4.4  --  3,546.00  HGB 13.8 14.5 14.7 12.9  HCT 40.3 42.2 43.0 38  MCV 95 93.6 95.3  --   PLT 145* 153 178 179   Lipid Panel: Recent Labs    02/25/23 0000  CHOL 116  HDL 35  LDLCALC 58   TRIG 409   TSH: Recent Labs    02/25/23 0000  TSH 1.93   A1C: Lab Results  Component Value Date   HGBA1C 7.5 02/25/2023     Assessment/Plan  1. Type 2 diabetes mellitus with stage 3 chronic kidney disease, with long-term current use of insulin, unspecified whether stage 3a or 3b CKD (HCC) Lab Results  Component Value Date   HGBA1C 7.5 02/25/2023   HGBA1C 7.5 (H) 12/31/2022   HGBA1C 7.4 (H) 11/10/2020   Bg at acceptable range Pt had 1 episode of hypoglycemia early morning  She eats her evening meal at 5 pm, instructed patient to eat a healthy snack at bedtime  Monitor for hypoglycemia Follow up in 4 weeks.  No follow-ups on file.:  1 month

## 2023-08-01 DIAGNOSIS — R2681 Unsteadiness on feet: Secondary | ICD-10-CM | POA: Diagnosis not present

## 2023-08-01 DIAGNOSIS — M6281 Muscle weakness (generalized): Secondary | ICD-10-CM | POA: Diagnosis not present

## 2023-08-01 DIAGNOSIS — R41841 Cognitive communication deficit: Secondary | ICD-10-CM | POA: Diagnosis not present

## 2023-08-03 DIAGNOSIS — M6281 Muscle weakness (generalized): Secondary | ICD-10-CM | POA: Diagnosis not present

## 2023-08-03 DIAGNOSIS — R41841 Cognitive communication deficit: Secondary | ICD-10-CM | POA: Diagnosis not present

## 2023-08-03 DIAGNOSIS — R2681 Unsteadiness on feet: Secondary | ICD-10-CM | POA: Diagnosis not present

## 2023-08-04 DIAGNOSIS — M6281 Muscle weakness (generalized): Secondary | ICD-10-CM | POA: Diagnosis not present

## 2023-08-04 DIAGNOSIS — R2681 Unsteadiness on feet: Secondary | ICD-10-CM | POA: Diagnosis not present

## 2023-08-04 DIAGNOSIS — R41841 Cognitive communication deficit: Secondary | ICD-10-CM | POA: Diagnosis not present

## 2023-08-08 DIAGNOSIS — R2681 Unsteadiness on feet: Secondary | ICD-10-CM | POA: Diagnosis not present

## 2023-08-08 DIAGNOSIS — M6281 Muscle weakness (generalized): Secondary | ICD-10-CM | POA: Diagnosis not present

## 2023-08-08 DIAGNOSIS — R41841 Cognitive communication deficit: Secondary | ICD-10-CM | POA: Diagnosis not present

## 2023-08-11 DIAGNOSIS — R41841 Cognitive communication deficit: Secondary | ICD-10-CM | POA: Diagnosis not present

## 2023-08-11 DIAGNOSIS — R2681 Unsteadiness on feet: Secondary | ICD-10-CM | POA: Diagnosis not present

## 2023-08-11 DIAGNOSIS — M6281 Muscle weakness (generalized): Secondary | ICD-10-CM | POA: Diagnosis not present

## 2023-08-15 ENCOUNTER — Encounter: Payer: Self-pay | Admitting: General Practice

## 2023-08-15 DIAGNOSIS — M6281 Muscle weakness (generalized): Secondary | ICD-10-CM | POA: Diagnosis not present

## 2023-08-15 DIAGNOSIS — R2681 Unsteadiness on feet: Secondary | ICD-10-CM | POA: Diagnosis not present

## 2023-08-15 DIAGNOSIS — R41841 Cognitive communication deficit: Secondary | ICD-10-CM | POA: Diagnosis not present

## 2023-08-17 ENCOUNTER — Inpatient Hospital Stay: Payer: Medicare Other | Attending: Hematology and Oncology | Admitting: Hematology and Oncology

## 2023-08-17 VITALS — BP 179/95 | HR 99 | Temp 97.3°F | Resp 18 | Wt 139.5 lb

## 2023-08-17 DIAGNOSIS — Z923 Personal history of irradiation: Secondary | ICD-10-CM | POA: Insufficient documentation

## 2023-08-17 DIAGNOSIS — D0511 Intraductal carcinoma in situ of right breast: Secondary | ICD-10-CM | POA: Diagnosis not present

## 2023-08-17 DIAGNOSIS — Z87891 Personal history of nicotine dependence: Secondary | ICD-10-CM | POA: Diagnosis not present

## 2023-08-17 NOTE — Assessment & Plan Note (Signed)
This is a very pleasant 89 year old female patient with past medical history significant for coronary artery disease, congestive heart failure, type 2 diabetes referred to oncology for evaluation and management of DCIS. She had lumpectomy, final pathology showed DCIS, underwent adjuvant radiation.  Given weak ER positivity, we have discussed about antiestrogen therapy but decision was made to proceed with surveillance alone.  She is now here for follow-up.  Since her last visit here, she had COVID and now has post-COVID fatigue which is quite bothersome to her.  Other than this, she has noticed some increased moles in the right chest wall at the site of her radiation.  She denies any other major complaints today.  Physical exam with postsurgical and postradiation changes, no other concerning palpable masses or regional adenopathy.  I do believe she has post-COVID fatigue, have encouraged her to increase the activity level slowly but steadily to help improve the fatigue. Other than this we have discussed about mammogram in May 2025.  She was encouraged to contact me with any questions or concerns.  She was interested in her bupropion study for post cancer fatigue but I do not believe her fatigue is related to the post cancer treatment since she was feeling well before the COVID-19 illness. She will return to clinic in approximately 6 months or sooner as needed.

## 2023-08-17 NOTE — Progress Notes (Signed)
[facility] CONSULT NOTE  Patient Care Team: Mast, Man X, NP as PCP - General (Internal Medicine) Abigail Miyamoto, MD as Consulting Physician (General Surgery) Rachel Moulds, MD as Consulting Physician (Hematology and Oncology) Dorothy Puffer, MD as Consulting Physician (Radiation Oncology)  CHIEF COMPLAINTS/PURPOSE OF CONSULTATION:  Breast cancer follow-up  HISTORY OF PRESENTING ILLNESS:  Grace Robinson 89 y.o. female is here because of recent diagnosis of right breast DCIS  I reviewed her records extensively and collaborated the history with the patient.  SUMMARY OF ONCOLOGIC HISTORY: Oncology History  Ductal carcinoma in situ (DCIS) of right breast  11/22/2022 Mammogram   1.7 * 5.8 cm* 3.4 segmental to borderline pleomorphic calcs in the right breast highly suggestive of malignancy. Stereotactic biopsy recommended.   12/01/2022 Pathology Results   DCIS, high grade,  ER 10% pos weak staining, PR neg   12/08/2022 Cancer Staging   Staging form: Breast, AJCC 8th Edition - Clinical: Stage 0 (cTis (DCIS), cN0, cM0, G2, ER+, PR-, HER2-) - Signed by Rachel Moulds, MD on 12/08/2022 Stage prefix: Initial diagnosis Histologic grading system: 3 grade system   01/05/2023 Surgery   A. BREAST, RIGHT, LUMPECTOMY:  Ductal carcinoma in situ, solid type, nuclear grade 3, with necrosis and  calcifications  DCIS greatest dimension: 5.0 cm  Margins:  negative      Closest margin:< 0.5 mm to lateral margin  Prognostic markers: ER (10%), PR (0%)  Biopsy site and clip: identified  Other findings: Flat epithelial atypia, columnar cell change, usual  ductal hyperplasia,  See oncology table    03/24/2023 - 04/21/2023 Radiation Therapy   Plan Name: Breast_R_UHRT Site: Breast, Right Technique: 3D Mode: Photon Dose Per Fraction: 5.7 Gy Prescribed Dose (Delivered / Prescribed): 28.5 Gy / 28.5 Gy Prescribed Fxs (Delivered / Prescribed): 5 / 5    Interval History  Patient arrived to  the appointment today with her son.  She is healthy at baseline except for history of congestive heart failure although she says that her cardiologist is quite pleased with the most recent workup.  She also has type 2 diabetes mellitus controlled on insulin, last hemoglobin A1c 7%. Since her last visit here she had right breast lumpectomy which showed DCIS, solid type, nuclear grade 3 with necrosis and calcifications, DCIS greatest dimension is 5 cm, ER 10% low positive, weak staining intensity, PR 0%  Interval history  Grace Robinson is here for follow-up with her son.  Since her last visit here, she says she had COVID about a month ago and she has not recovered from the fatigue.  She says she tries to wake up, take a bath and get dressed and then she is so exhausted that she has to take a nap.  She is trying to walk in the hallways at the retirement facility.  She otherwise has noted some increased moles on the right side since the radiation.  Besides the fatigue which has been the major issue, she denies any other complaints.  Rest of the pertinent 10 point ROS reviewed and negative  MEDICAL HISTORY:  Past Medical History:  Diagnosis Date   Biventricular ICD (implantable cardiac defibrillator) in place    downgraded from CRT-D to CRT-P 08/30/22   Breast cancer (HCC)    Cellulitis    Change in bowel habits    CHF (congestive heart failure) (HCC)    Colon polyps    Diabetes mellitus    Type II   Diarrhea    Dyslipidemia  HOH (hard of hearing)    left   HTN (hypertension)    LBBB (left bundle branch block)    chronic   Meniere's disease    Nonischemic cardiomyopathy (HCC) 2006   Presence of permanent cardiac pacemaker    T wave oversensing 01/17/2014   Ulcerative colitis    Varicose veins    4 lazer  treatment each leg    SURGICAL HISTORY: Past Surgical History:  Procedure Laterality Date   ABDOMINAL HYSTERECTOMY     BIV PACEMAKER GENERATOR CHANGEOUT N/A 08/30/2022   Procedure:  BIV PACEMAKER GENERATOR CHANGEOUT;  Surgeon: Duke Salvia, MD;  Location: [facility] INVASIVE CV LAB;  Service: Cardiovascular;  Laterality: N/A;   BREAST LUMPECTOMY WITH RADIOACTIVE SEED LOCALIZATION Right 01/05/2023   Procedure: RIGHT BREAST BRACKETED LUMPECTOMY WITH RADIOACTIVE SEED LOCALIZATION;  Surgeon: Abigail Miyamoto, MD;  Location: [facility] OR;  Service: General;  Laterality: Right;   CARDIAC CATHETERIZATION  08/2011   CHOLECYSTECTOMY     COLONOSCOPY     EP Study  08/22/08   IMPLANTABLE CARDIOVERTER DEFIBRILLATOR GENERATOR CHANGE N/A 08/22/2013   Procedure: IMPLANTABLE CARDIOVERTER DEFIBRILLATOR GENERATOR CHANGE;  Surgeon: Duke Salvia, MD;  Location: [facility] CATH LAB;  Service: Cardiovascular;  Laterality: N/A;   KNEE ARTHROSCOPY Left 04/14/2016   Procedure: LEFT KNEE ARTHROSCOPY, PARTIAL MEDIAL AND PARTIAL LATERAL MENISCECTOMY, MEDIAL PLICA;  Surgeon: Jodi Geralds, MD;  Location: [facility] OR;  Service: Orthopedics;  Laterality: Left;   mastoid sugery     in the setting of Meniere's disease   TONSILLECTOMY AND ADENOIDECTOMY      SOCIAL HISTORY: Social History   Socioeconomic History   Marital status: Single    Spouse name: Not on file   Number of children: 2   Years of education: Not on file   Highest education level: Bachelor's degree (e.g., BA, AB, BS)  Occupational History   Occupation: retired    Comment: Runner, 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    Social Determinants of Health   Financial Resource Strain: Low Risk  (12/08/2022)   Overall Financial Resource Strain (CARDIA)     Difficulty of Paying Living Expenses: Not hard at all  Food Insecurity: No Food Insecurity (12/08/2022)   Hunger Vital Sign    Worried About Running Out of Food in the Last Year: Never true    Ran Out of Food in the Last Year: Never true  Transportation Needs: No Transportation Needs (12/08/2022)   PRAPARE - Administrator, Civil Service (Medical): No    Lack of Transportation (Non-Medical): No  Physical Activity: Not on file  Stress: Not on file  Social Connections: Not on file  Intimate Partner Violence: Not on file    FAMILY HISTORY: Family History  Problem Relation Age of Onset   Breast cancer Mother 53       met to lungs   Emphysema Father 62   Heart disease Father    Healthy Child    Lung cancer Cousin  maternal first cousin, she smoked   Colon cancer Neg Hx    Rectal cancer Neg Hx    Stomach cancer Neg Hx    Liver cancer Neg Hx    Esophageal cancer Neg Hx     ALLERGIES:  is allergic to carvedilol, digoxin, vancomycin, and pseudoephedrine.  MEDICATIONS:  Current Outpatient Medications  Medication Sig Dispense Refill   acetaminophen (TYLENOL) 500 MG tablet Take 500 mg by mouth every 8 (eight) hours as needed for headache.     alendronate (FOSAMAX) 70 MG tablet Take 1 tablet (70 mg total) by mouth every 7 (seven) days. Take with a full glass of water on an empty stomach. 4 tablet 11   aspirin EC 81 MG tablet Take 81 mg by mouth daily. Swallow whole.     atorvastatin (LIPITOR) 40 MG tablet Take 40 mg by mouth daily.  3   benzonatate (TESSALON) 200 MG capsule Take 1 capsule (200 mg total) by mouth 2 (two) times daily as needed for cough. 20 capsule 0   calcium carbonate (CALCIUM 600) 600 MG TABS tablet Take 600 mg by mouth 2 (two) times daily with a meal.     Cholecalciferol (VITAMIN D3) 50 MCG (2000 UT) CAPS Take 2 capsules by mouth daily in the afternoon.     Continuous Glucose Receiver (DEXCOM G6 RECEIVER) DEVI 1 Device by Does not apply route as  directed. 1 each 11   Continuous Glucose Sensor (DEXCOM G6 SENSOR) MISC 1 Device by Does not apply route as directed. 3 each 11   Continuous Glucose Transmitter (DEXCOM G6 TRANSMITTER) MISC 1 Device by Does not apply route as directed. 1 each 11   Cranberry 500 MG CAPS Take 2 tablets by mouth daily.     dicyclomine (BENTYL) 10 MG capsule TAKE 1 CAPSULE BY MOUTH THREE TIMES DAILY 30 MINUTES BEFORE MEALS 90 capsule 2   fluticasone (FLONASE) 50 MCG/ACT nasal spray Place 2 sprays into both nostrils daily. 16 g 6   furosemide (LASIX) 20 MG tablet Take 1 tablet (20 mg total) by mouth daily. alternating (40 mg) every other day. 60 tablet 11   insulin glargine, 2 Unit Dial, (TOUJEO MAX SOLOSTAR) 300 UNIT/ML Solostar Pen Inject 50 Units into the skin daily. 9 mL 3   loperamide (IMODIUM A-D) 2 MG tablet Take 2 mg by mouth as needed for diarrhea or loose stools.     loratadine (CLARITIN) 10 MG tablet Take 1 tablet (10 mg total) by mouth daily. 90 tablet 3   metoprolol succinate (TOPROL-XL) 100 MG 24 hr tablet Take 100 mg by mouth 2 (two) times daily.     olmesartan (BENICAR) 40 MG tablet Take 40 mg by mouth daily.  3   potassium chloride (K-DUR) 10 MEQ tablet Take 10 mEq by mouth 2 (two) times daily.     No current facility-administered medications for this visit.    REVIEW OF SYSTEMS:   Constitutional: Denies fevers, chills or abnormal night sweats Eyes: Denies blurriness of vision, double vision or watery eyes Ears, nose, mouth, throat, and face: Denies mucositis or sore throat Respiratory: Denies cough, dyspnea or wheezes Cardiovascular: Denies palpitation, chest discomfort or lower extremity swelling Gastrointestinal:  Denies nausea, heartburn or change in bowel habits Skin: Denies abnormal skin rashes Lymphatics: Denies new lymphadenopathy or easy bruising Neurological:Denies numbness, tingling or new weaknesses Behavioral/Psych: Mood is stable, no new changes  Breast: She noted some retraction  of the nipple, no lumps felt. All other systems were reviewed with  the patient and are negative.  PHYSICAL EXAMINATION: ECOG PERFORMANCE STATUS: 0 - Asymptomatic  Vitals:   08/17/23 1321  BP: (!) 179/95  Pulse: 99  Resp: 18  Temp: (!) 97.3 F (36.3 C)  SpO2: 97%    Filed Weights   08/17/23 1321  Weight: 139 lb 8 oz (63.3 kg)     GENERAL:alert, no distress and comfortable Breasts: Bilateral breast inspected and palpated.  Right breast with the postsurgical changes, tenderness noted around the surgical scar.  No definitive palpable masses.  Skin with some additional moles as noted by the patient but none of them appear to be cancerous.  No regional adenopathy.  Left breast inspected and palpated without any concerns  LABORATORY DATA:  I have reviewed the data as listed Lab Results  Component Value Date   WBC 5.3 02/25/2023   HGB 12.9 02/25/2023   HCT 38 02/25/2023   MCV 95.3 12/31/2022   PLT 179 02/25/2023   Lab Results  Component Value Date   NA 143 02/25/2023   K 3.7 02/25/2023   CL 104 02/25/2023   CO2 29 (A) 02/25/2023    RADIOGRAPHIC STUDIES: I have personally reviewed the radiological reports and agreed with the findings in the report.  ASSESSMENT AND PLAN:  Ductal carcinoma in situ (DCIS) of right breast This is a very pleasant 89 year old female patient with past medical history significant for coronary artery disease, congestive heart failure, type 2 diabetes referred to oncology for evaluation and management of DCIS. She had lumpectomy, final pathology showed DCIS, underwent adjuvant radiation.  Given weak ER positivity, we have discussed about antiestrogen therapy but decision was made to proceed with surveillance alone.  She is now here for follow-up.  Since her last visit here, she had COVID and now has post-COVID fatigue which is quite bothersome to her.  Other than this, she has noticed some increased moles in the right chest wall at the site of her  radiation.  She denies any other major complaints today.  Physical exam with postsurgical and postradiation changes, no other concerning palpable masses or regional adenopathy.  I do believe she has post-COVID fatigue, have encouraged her to increase the activity level slowly but steadily to help improve the fatigue. Other than this we have discussed about mammogram in May 2025.  She was encouraged to contact me with any questions or concerns.  She was interested in her bupropion study for post cancer fatigue but I do not believe her fatigue is related to the post cancer treatment since she was feeling well before the COVID-19 illness. She will return to clinic in approximately 6 months or sooner as needed.   All questions were answered. The patient knows to call the clinic with any problems, questions or concerns.    Rachel Moulds, MD 08/17/23

## 2023-08-18 ENCOUNTER — Telehealth: Payer: Self-pay | Admitting: Hematology and Oncology

## 2023-08-18 DIAGNOSIS — R2681 Unsteadiness on feet: Secondary | ICD-10-CM | POA: Diagnosis not present

## 2023-08-18 DIAGNOSIS — M6281 Muscle weakness (generalized): Secondary | ICD-10-CM | POA: Diagnosis not present

## 2023-08-18 DIAGNOSIS — R41841 Cognitive communication deficit: Secondary | ICD-10-CM | POA: Diagnosis not present

## 2023-08-18 NOTE — Telephone Encounter (Signed)
Spoke with patient confirming upcoming appointments  

## 2023-08-22 ENCOUNTER — Ambulatory Visit: Payer: Medicare Other | Attending: Internal Medicine

## 2023-08-22 ENCOUNTER — Other Ambulatory Visit: Payer: Self-pay

## 2023-08-22 ENCOUNTER — Inpatient Hospital Stay: Payer: Medicare Other

## 2023-08-22 DIAGNOSIS — Z95810 Presence of automatic (implantable) cardiac defibrillator: Secondary | ICD-10-CM | POA: Diagnosis not present

## 2023-08-22 DIAGNOSIS — I5022 Chronic systolic (congestive) heart failure: Secondary | ICD-10-CM | POA: Diagnosis not present

## 2023-08-22 DIAGNOSIS — Z923 Personal history of irradiation: Secondary | ICD-10-CM | POA: Diagnosis not present

## 2023-08-22 DIAGNOSIS — D0511 Intraductal carcinoma in situ of right breast: Secondary | ICD-10-CM | POA: Diagnosis not present

## 2023-08-22 DIAGNOSIS — Z87891 Personal history of nicotine dependence: Secondary | ICD-10-CM | POA: Diagnosis not present

## 2023-08-22 LAB — VITAMIN B12: Vitamin B-12: 414 pg/mL (ref 180–914)

## 2023-08-25 ENCOUNTER — Telehealth: Payer: Medicare Other

## 2023-08-25 ENCOUNTER — Telehealth: Payer: Self-pay

## 2023-08-25 NOTE — Telephone Encounter (Signed)
Remote ICM transmission received.  Attempted call to patient regarding ICM remote transmission and left detailed message per DPR.  Left ICM phone number and advised to return call for any fluid symptoms or questions. Next ICM remote transmission scheduled 09/26/2023.

## 2023-08-25 NOTE — Progress Notes (Signed)
EPIC Encounter for ICM Monitoring  Patient Name: Grace Robinson is a 89 y.o. female Date: 08/25/2023 Primary Care Physican: Mast, Man X, NP Primary Cardiologist: Graciela Husbands Electrophysiologist: Joycelyn Schmid Pacing:  98.1%     12/24/2022 Weight:  130 lbs   Clinical Status  Since 18-Jul-2023 Time in AT/AF  <0.1 hr/day (<0.1%)            Attempted call to patient and unable to reach.  Left detailed message per DPR regarding transmission. Transmission reviewed.    Optivol Thoracic impedance suggesting normal fluid levels.       Prescribed:  Furosemide 20 mg take 1 tablet (20 mg total) by mouth daily alternating (40 mg) every other day.   Potassium 10 mEq 1 tablet twice a day.   Labs: 02/25/2023 Creatinine 0.8    BUN 17,  Potassium 3.7, Sodium 143 12/31/2022 Creatinine 0.94, BUN 13, Potassium 3.6, Sodium 140, GFR 56 A complete set of results can be found in Results Review.   Recommendations:  Left voice mail with ICM number and encouraged to call if experiencing any fluid symptoms.   Follow-up plan: ICM clinic phone appointment on 09/26/2023.   91 day device clinic remote transmission 08/31/2023.      EP/Cardiology Office Visits:  EP scheduling attempted calls to patient to make recall appt with Dr Graciela Husbands.   Recall 09/03/2023 with Dr Graciela Husbands.     Copy of ICM check sent to Dr. Graciela Husbands.   3 month ICM trend: 08/22/2023.    12-14 Month ICM trend:     Karie Soda, RN 08/25/2023 12:14 PM

## 2023-08-25 NOTE — Telephone Encounter (Signed)
Patient is questioning if her blood sugar is too high, during the day it's in the 200-300 range. Patient denies any associated symptoms when blood sugar is high.  Lunch- Small bowl of mashed potatoes, cauliflower soup, glass of sweet tea(small) and 1/2 of a roll. Patient when to diabetes program at [facility] ( had carrot and kale dip) and Blood sugar is 313.  Most nights blood sugar is 230 something and in the morning it's down 90-130. Patient asked that I send this message to Dr.Veludandi to see if medication needs to be adjusted or what she recommends.

## 2023-08-26 NOTE — Telephone Encounter (Signed)
Spoke with Rosey Bath Independent Nurse , pt is currently taking 48 units glargine  Will increase to 50 units daily  Avoid high carbohydrate foods  Monitor for hypoglycemia.

## 2023-08-31 ENCOUNTER — Ambulatory Visit (INDEPENDENT_AMBULATORY_CARE_PROVIDER_SITE_OTHER): Payer: Medicare Other

## 2023-08-31 DIAGNOSIS — I428 Other cardiomyopathies: Secondary | ICD-10-CM

## 2023-08-31 LAB — CUP PACEART REMOTE DEVICE CHECK
Battery Remaining Longevity: 132 mo
Battery Voltage: 3.04 V
Brady Statistic AP VP Percent: 37.12 %
Brady Statistic AP VS Percent: 0.59 %
Brady Statistic AS VP Percent: 61.02 %
Brady Statistic AS VS Percent: 1.27 %
Brady Statistic RA Percent Paced: 37.74 %
Brady Statistic RV Percent Paced: 0.32 %
Date Time Interrogation Session: 20241030060109
Implantable Lead Connection Status: 753985
Implantable Lead Connection Status: 753985
Implantable Lead Connection Status: 753985
Implantable Lead Connection Status: 753985
Implantable Lead Implant Date: 20031007
Implantable Lead Implant Date: 20031007
Implantable Lead Implant Date: 20031007
Implantable Lead Implant Date: 20060227
Implantable Lead Location: 753858
Implantable Lead Location: 753859
Implantable Lead Location: 753860
Implantable Lead Location: 753860
Implantable Lead Model: 4193
Implantable Lead Model: 4469
Implantable Lead Model: 4470
Implantable Lead Model: 6949
Implantable Lead Serial Number: 337988
Implantable Lead Serial Number: 361236
Implantable Pulse Generator Implant Date: 20231030
Lead Channel Impedance Value: 3363 Ohm
Lead Channel Impedance Value: 3363 Ohm
Lead Channel Impedance Value: 3363 Ohm
Lead Channel Impedance Value: 342 Ohm
Lead Channel Impedance Value: 361 Ohm
Lead Channel Impedance Value: 399 Ohm
Lead Channel Impedance Value: 475 Ohm
Lead Channel Impedance Value: 551 Ohm
Lead Channel Impedance Value: 608 Ohm
Lead Channel Pacing Threshold Amplitude: 0.75 V
Lead Channel Pacing Threshold Amplitude: 1.125 V
Lead Channel Pacing Threshold Pulse Width: 0.4 ms
Lead Channel Pacing Threshold Pulse Width: 0.4 ms
Lead Channel Sensing Intrinsic Amplitude: 1.25 mV
Lead Channel Sensing Intrinsic Amplitude: 1.25 mV
Lead Channel Sensing Intrinsic Amplitude: 31.625 mV
Lead Channel Sensing Intrinsic Amplitude: 31.625 mV
Lead Channel Setting Pacing Amplitude: 1.5 V
Lead Channel Setting Pacing Amplitude: 1.5 V
Lead Channel Setting Pacing Amplitude: 2.5 V
Lead Channel Setting Pacing Pulse Width: 0.4 ms
Lead Channel Setting Pacing Pulse Width: 0.8 ms
Lead Channel Setting Sensing Sensitivity: 1.2 mV
Zone Setting Status: 755011

## 2023-09-06 ENCOUNTER — Emergency Department (HOSPITAL_COMMUNITY): Payer: Medicare Other

## 2023-09-06 ENCOUNTER — Other Ambulatory Visit: Payer: Self-pay

## 2023-09-06 ENCOUNTER — Encounter (HOSPITAL_COMMUNITY)
Admission: EM | Disposition: A | Discharge status: Skilled Nursing Facility | Payer: Self-pay | Source: Skilled Nursing Facility | Attending: Internal Medicine

## 2023-09-06 ENCOUNTER — Encounter (HOSPITAL_COMMUNITY): Payer: Self-pay

## 2023-09-06 ENCOUNTER — Inpatient Hospital Stay (HOSPITAL_COMMUNITY)
Admission: EM | Admit: 2023-09-06 | Discharge: 2023-09-14 | DRG: 464 | Disposition: A | Discharge status: Skilled Nursing Facility | Payer: Medicare Other | Source: Skilled Nursing Facility | Attending: Internal Medicine | Admitting: Internal Medicine

## 2023-09-06 DIAGNOSIS — Z7401 Bed confinement status: Secondary | ICD-10-CM | POA: Diagnosis not present

## 2023-09-06 DIAGNOSIS — S99911A Unspecified injury of right ankle, initial encounter: Secondary | ICD-10-CM | POA: Diagnosis not present

## 2023-09-06 DIAGNOSIS — S82851C Displaced trimalleolar fracture of right lower leg, initial encounter for open fracture type IIIA, IIIB, or IIIC: Secondary | ICD-10-CM | POA: Diagnosis not present

## 2023-09-06 DIAGNOSIS — I10 Essential (primary) hypertension: Secondary | ICD-10-CM | POA: Diagnosis present

## 2023-09-06 DIAGNOSIS — S91001A Unspecified open wound, right ankle, initial encounter: Secondary | ICD-10-CM | POA: Insufficient documentation

## 2023-09-06 DIAGNOSIS — S82831B Other fracture of upper and lower end of right fibula, initial encounter for open fracture type I or II: Principal | ICD-10-CM | POA: Diagnosis present

## 2023-09-06 DIAGNOSIS — Z923 Personal history of irradiation: Secondary | ICD-10-CM

## 2023-09-06 DIAGNOSIS — I428 Other cardiomyopathies: Secondary | ICD-10-CM | POA: Diagnosis present

## 2023-09-06 DIAGNOSIS — Z7983 Long term (current) use of bisphosphonates: Secondary | ICD-10-CM

## 2023-09-06 DIAGNOSIS — M1711 Unilateral primary osteoarthritis, right knee: Secondary | ICD-10-CM | POA: Diagnosis not present

## 2023-09-06 DIAGNOSIS — Z950 Presence of cardiac pacemaker: Secondary | ICD-10-CM | POA: Diagnosis not present

## 2023-09-06 DIAGNOSIS — D0511 Intraductal carcinoma in situ of right breast: Secondary | ICD-10-CM | POA: Diagnosis present

## 2023-09-06 DIAGNOSIS — S82431A Displaced oblique fracture of shaft of right fibula, initial encounter for closed fracture: Secondary | ICD-10-CM | POA: Diagnosis not present

## 2023-09-06 DIAGNOSIS — Z66 Do not resuscitate: Secondary | ICD-10-CM | POA: Diagnosis present

## 2023-09-06 DIAGNOSIS — E119 Type 2 diabetes mellitus without complications: Secondary | ICD-10-CM | POA: Diagnosis not present

## 2023-09-06 DIAGNOSIS — E1122 Type 2 diabetes mellitus with diabetic chronic kidney disease: Secondary | ICD-10-CM

## 2023-09-06 DIAGNOSIS — M79604 Pain in right leg: Secondary | ICD-10-CM | POA: Diagnosis not present

## 2023-09-06 DIAGNOSIS — S82851A Displaced trimalleolar fracture of right lower leg, initial encounter for closed fracture: Secondary | ICD-10-CM | POA: Diagnosis not present

## 2023-09-06 DIAGNOSIS — S93431A Sprain of tibiofibular ligament of right ankle, initial encounter: Secondary | ICD-10-CM | POA: Diagnosis not present

## 2023-09-06 DIAGNOSIS — K589 Irritable bowel syndrome without diarrhea: Secondary | ICD-10-CM | POA: Diagnosis not present

## 2023-09-06 DIAGNOSIS — S82831C Other fracture of upper and lower end of right fibula, initial encounter for open fracture type IIIA, IIIB, or IIIC: Secondary | ICD-10-CM

## 2023-09-06 DIAGNOSIS — N189 Chronic kidney disease, unspecified: Secondary | ICD-10-CM | POA: Diagnosis not present

## 2023-09-06 DIAGNOSIS — H9192 Unspecified hearing loss, left ear: Secondary | ICD-10-CM | POA: Diagnosis present

## 2023-09-06 DIAGNOSIS — I130 Hypertensive heart and chronic kidney disease with heart failure and stage 1 through stage 4 chronic kidney disease, or unspecified chronic kidney disease: Secondary | ICD-10-CM | POA: Diagnosis not present

## 2023-09-06 DIAGNOSIS — Z79899 Other long term (current) drug therapy: Secondary | ICD-10-CM

## 2023-09-06 DIAGNOSIS — S82851D Displaced trimalleolar fracture of right lower leg, subsequent encounter for closed fracture with routine healing: Secondary | ICD-10-CM | POA: Diagnosis not present

## 2023-09-06 DIAGNOSIS — Z7982 Long term (current) use of aspirin: Secondary | ICD-10-CM

## 2023-09-06 DIAGNOSIS — Z803 Family history of malignant neoplasm of breast: Secondary | ICD-10-CM

## 2023-09-06 DIAGNOSIS — Z888 Allergy status to other drugs, medicaments and biological substances status: Secondary | ICD-10-CM | POA: Diagnosis not present

## 2023-09-06 DIAGNOSIS — Z881 Allergy status to other antibiotic agents status: Secondary | ICD-10-CM

## 2023-09-06 DIAGNOSIS — E785 Hyperlipidemia, unspecified: Secondary | ICD-10-CM | POA: Diagnosis not present

## 2023-09-06 DIAGNOSIS — S91001D Unspecified open wound, right ankle, subsequent encounter: Secondary | ICD-10-CM | POA: Diagnosis not present

## 2023-09-06 DIAGNOSIS — M11261 Other chondrocalcinosis, right knee: Secondary | ICD-10-CM | POA: Diagnosis not present

## 2023-09-06 DIAGNOSIS — S82431C Displaced oblique fracture of shaft of right fibula, initial encounter for open fracture type IIIA, IIIB, or IIIC: Secondary | ICD-10-CM | POA: Diagnosis not present

## 2023-09-06 DIAGNOSIS — K580 Irritable bowel syndrome with diarrhea: Secondary | ICD-10-CM | POA: Diagnosis not present

## 2023-09-06 DIAGNOSIS — E1129 Type 2 diabetes mellitus with other diabetic kidney complication: Secondary | ICD-10-CM | POA: Diagnosis not present

## 2023-09-06 DIAGNOSIS — M170 Bilateral primary osteoarthritis of knee: Secondary | ICD-10-CM | POA: Diagnosis not present

## 2023-09-06 DIAGNOSIS — R739 Hyperglycemia, unspecified: Secondary | ICD-10-CM | POA: Diagnosis not present

## 2023-09-06 DIAGNOSIS — I509 Heart failure, unspecified: Secondary | ICD-10-CM | POA: Diagnosis not present

## 2023-09-06 DIAGNOSIS — Z87891 Personal history of nicotine dependence: Secondary | ICD-10-CM | POA: Diagnosis not present

## 2023-09-06 DIAGNOSIS — F05 Delirium due to known physiological condition: Secondary | ICD-10-CM | POA: Diagnosis not present

## 2023-09-06 DIAGNOSIS — S9304XA Dislocation of right ankle joint, initial encounter: Secondary | ICD-10-CM | POA: Diagnosis not present

## 2023-09-06 DIAGNOSIS — I5022 Chronic systolic (congestive) heart failure: Secondary | ICD-10-CM | POA: Diagnosis not present

## 2023-09-06 DIAGNOSIS — W1830XA Fall on same level, unspecified, initial encounter: Secondary | ICD-10-CM | POA: Diagnosis present

## 2023-09-06 DIAGNOSIS — N183 Chronic kidney disease, stage 3 unspecified: Secondary | ICD-10-CM | POA: Diagnosis not present

## 2023-09-06 DIAGNOSIS — Z043 Encounter for examination and observation following other accident: Secondary | ICD-10-CM | POA: Diagnosis not present

## 2023-09-06 DIAGNOSIS — Z794 Long term (current) use of insulin: Secondary | ICD-10-CM

## 2023-09-06 DIAGNOSIS — S50311A Abrasion of right elbow, initial encounter: Secondary | ICD-10-CM | POA: Diagnosis present

## 2023-09-06 DIAGNOSIS — R41841 Cognitive communication deficit: Secondary | ICD-10-CM | POA: Diagnosis not present

## 2023-09-06 DIAGNOSIS — Z8249 Family history of ischemic heart disease and other diseases of the circulatory system: Secondary | ICD-10-CM

## 2023-09-06 DIAGNOSIS — M1712 Unilateral primary osteoarthritis, left knee: Secondary | ICD-10-CM | POA: Diagnosis not present

## 2023-09-06 DIAGNOSIS — Z23 Encounter for immunization: Secondary | ICD-10-CM | POA: Diagnosis not present

## 2023-09-06 DIAGNOSIS — Z90710 Acquired absence of both cervix and uterus: Secondary | ICD-10-CM

## 2023-09-06 DIAGNOSIS — M1990 Unspecified osteoarthritis, unspecified site: Secondary | ICD-10-CM | POA: Diagnosis not present

## 2023-09-06 DIAGNOSIS — G8918 Other acute postprocedural pain: Secondary | ICD-10-CM | POA: Diagnosis not present

## 2023-09-06 DIAGNOSIS — M25571 Pain in right ankle and joints of right foot: Secondary | ICD-10-CM | POA: Diagnosis not present

## 2023-09-06 DIAGNOSIS — R58 Hemorrhage, not elsewhere classified: Secondary | ICD-10-CM | POA: Diagnosis not present

## 2023-09-06 DIAGNOSIS — R531 Weakness: Secondary | ICD-10-CM | POA: Diagnosis not present

## 2023-09-06 DIAGNOSIS — M8580 Other specified disorders of bone density and structure, unspecified site: Secondary | ICD-10-CM | POA: Diagnosis not present

## 2023-09-06 DIAGNOSIS — I110 Hypertensive heart disease with heart failure: Secondary | ICD-10-CM | POA: Diagnosis not present

## 2023-09-06 DIAGNOSIS — E876 Hypokalemia: Secondary | ICD-10-CM | POA: Diagnosis not present

## 2023-09-06 DIAGNOSIS — Z9049 Acquired absence of other specified parts of digestive tract: Secondary | ICD-10-CM

## 2023-09-06 DIAGNOSIS — Z853 Personal history of malignant neoplasm of breast: Secondary | ICD-10-CM | POA: Diagnosis not present

## 2023-09-06 DIAGNOSIS — S82891A Other fracture of right lower leg, initial encounter for closed fracture: Secondary | ICD-10-CM | POA: Diagnosis not present

## 2023-09-06 DIAGNOSIS — S82841C Displaced bimalleolar fracture of right lower leg, initial encounter for open fracture type IIIA, IIIB, or IIIC: Secondary | ICD-10-CM | POA: Diagnosis not present

## 2023-09-06 DIAGNOSIS — M19011 Primary osteoarthritis, right shoulder: Secondary | ICD-10-CM | POA: Diagnosis not present

## 2023-09-06 DIAGNOSIS — R2689 Other abnormalities of gait and mobility: Secondary | ICD-10-CM | POA: Diagnosis not present

## 2023-09-06 HISTORY — PX: I & D EXTREMITY: SHX5045

## 2023-09-06 HISTORY — PX: CAST APPLICATION: SHX380

## 2023-09-06 LAB — CBC
HCT: 40.9 % (ref 36.0–46.0)
Hemoglobin: 13.4 g/dL (ref 12.0–15.0)
MCH: 31.3 pg (ref 26.0–34.0)
MCHC: 32.8 g/dL (ref 30.0–36.0)
MCV: 95.6 fL (ref 80.0–100.0)
Platelets: 154 10*3/uL (ref 150–400)
RBC: 4.28 MIL/uL (ref 3.87–5.11)
RDW: 13.5 % (ref 11.5–15.5)
WBC: 8.4 10*3/uL (ref 4.0–10.5)
nRBC: 0 % (ref 0.0–0.2)

## 2023-09-06 LAB — BASIC METABOLIC PANEL
Anion gap: 15 (ref 5–15)
BUN: 25 mg/dL — ABNORMAL HIGH (ref 8–23)
CO2: 21 mmol/L — ABNORMAL LOW (ref 22–32)
Calcium: 9.2 mg/dL (ref 8.9–10.3)
Chloride: 103 mmol/L (ref 98–111)
Creatinine, Ser: 1.19 mg/dL — ABNORMAL HIGH (ref 0.44–1.00)
GFR, Estimated: 42 mL/min — ABNORMAL LOW (ref >60)
Glucose, Bld: 353 mg/dL — ABNORMAL HIGH (ref 70–99)
Potassium: 3.6 mmol/L (ref 3.5–5.1)
Sodium: 139 mmol/L (ref 135–145)

## 2023-09-06 SURGERY — IRRIGATION AND DEBRIDEMENT EXTREMITY
Anesthesia: General | Site: Ankle | Laterality: Right

## 2023-09-06 MED ORDER — METHOCARBAMOL 500 MG PO TABS: 500.0000 mg | ORAL_TABLET | Freq: Four times a day (QID) | ORAL | Status: DC | PRN

## 2023-09-06 MED ORDER — FENTANYL CITRATE PF 50 MCG/ML IJ SOSY: 12.5000 ug | PREFILLED_SYRINGE | INTRAMUSCULAR | Status: DC | PRN

## 2023-09-06 MED ORDER — INSULIN ASPART 100 UNIT/ML IJ SOLN
0.0000 [IU] | Freq: Three times a day (TID) | INTRAMUSCULAR | Status: DC
  Administered 2023-09-07 (×3): 2 [IU] via SUBCUTANEOUS
  Administered 2023-09-08: 5 [IU] via SUBCUTANEOUS
  Administered 2023-09-08: 2 [IU] via SUBCUTANEOUS
  Administered 2023-09-09 (×2): 1 [IU] via SUBCUTANEOUS
  Administered 2023-09-10: 7 [IU] via SUBCUTANEOUS
  Administered 2023-09-10: 5 [IU] via SUBCUTANEOUS
  Administered 2023-09-10: 2 [IU] via SUBCUTANEOUS
  Administered 2023-09-11 (×2): 3 [IU] via SUBCUTANEOUS
  Administered 2023-09-11: 2 [IU] via SUBCUTANEOUS
  Administered 2023-09-12: 3 [IU] via SUBCUTANEOUS
  Administered 2023-09-12: 2 [IU] via SUBCUTANEOUS
  Administered 2023-09-12: 3 [IU] via SUBCUTANEOUS
  Administered 2023-09-13: 2 [IU] via SUBCUTANEOUS
  Administered 2023-09-13: 1 [IU] via SUBCUTANEOUS
  Administered 2023-09-13: 7 [IU] via SUBCUTANEOUS
  Administered 2023-09-14 (×2): 2 [IU] via SUBCUTANEOUS

## 2023-09-06 MED ORDER — CEFAZOLIN SODIUM-DEXTROSE 2-4 GM/100ML-% IV SOLN
INTRAVENOUS | Status: AC
  Administered 2023-09-07: 2 g
  Filled 2023-09-06: qty 100

## 2023-09-06 MED ORDER — OXYCODONE HCL 5 MG PO TABS
2.5000 mg | ORAL_TABLET | ORAL | Status: DC | PRN
  Administered 2023-09-08: 2.5 mg via ORAL
  Filled 2023-09-06: qty 1

## 2023-09-06 MED ORDER — PROPOFOL 10 MG/ML IV BOLUS
INTRAVENOUS | Status: AC
  Filled 2023-09-06: qty 20

## 2023-09-06 MED ORDER — TETANUS-DIPHTH-ACELL PERTUSSIS 5-2.5-18.5 LF-MCG/0.5 IM SUSY
0.5000 mL | PREFILLED_SYRINGE | Freq: Once | INTRAMUSCULAR | Status: AC
  Administered 2023-09-06: 0.5 mL via INTRAMUSCULAR
  Filled 2023-09-06: qty 0.5

## 2023-09-06 MED ORDER — ONDANSETRON HCL 4 MG/2ML IJ SOLN: 4.0000 mg | Freq: Four times a day (QID) | INTRAMUSCULAR | Status: DC | PRN

## 2023-09-06 MED ORDER — ACETAMINOPHEN 10 MG/ML IV SOLN
INTRAVENOUS | Status: AC
  Filled 2023-09-06: qty 100

## 2023-09-06 MED ORDER — METOPROLOL SUCCINATE ER 50 MG PO TB24
100.0000 mg | ORAL_TABLET | Freq: Two times a day (BID) | ORAL | Status: DC
  Administered 2023-09-07 – 2023-09-14 (×15): 100 mg via ORAL
  Filled 2023-09-06: qty 2
  Filled 2023-09-06: qty 4
  Filled 2023-09-06: qty 2
  Filled 2023-09-06: qty 4
  Filled 2023-09-06: qty 2
  Filled 2023-09-06 (×2): qty 4
  Filled 2023-09-06: qty 2
  Filled 2023-09-06: qty 4
  Filled 2023-09-06 (×6): qty 2

## 2023-09-06 MED ORDER — ONDANSETRON HCL 4 MG PO TABS: 4.0000 mg | ORAL_TABLET | Freq: Four times a day (QID) | ORAL | Status: DC | PRN

## 2023-09-06 MED ORDER — POLYETHYLENE GLYCOL 3350 17 G PO PACK: 17.0000 g | PACK | Freq: Every day | ORAL | Status: DC | PRN

## 2023-09-06 MED ORDER — SODIUM CHLORIDE 0.9 % IV SOLN
2.0000 g | INTRAVENOUS | Status: DC
Start: 2023-09-06 — End: 2023-09-06

## 2023-09-06 MED ORDER — ACETAMINOPHEN 650 MG RE SUPP: 650.0000 mg | Freq: Four times a day (QID) | RECTAL | Status: DC | PRN

## 2023-09-06 MED ORDER — ATORVASTATIN CALCIUM 40 MG PO TABS
40.0000 mg | ORAL_TABLET | Freq: Every day | ORAL | Status: DC
  Administered 2023-09-07 – 2023-09-14 (×8): 40 mg via ORAL
  Filled 2023-09-06 (×8): qty 1

## 2023-09-06 MED ORDER — INSULIN GLARGINE-YFGN 100 UNIT/ML ~~LOC~~ SOLN
25.0000 [IU] | Freq: Every day | SUBCUTANEOUS | Status: DC
  Administered 2023-09-07 – 2023-09-14 (×8): 25 [IU] via SUBCUTANEOUS
  Filled 2023-09-06 (×8): qty 0.25

## 2023-09-06 MED ORDER — SODIUM CHLORIDE 0.9% FLUSH
3.0000 mL | Freq: Two times a day (BID) | INTRAVENOUS | Status: DC
  Administered 2023-09-07 – 2023-09-14 (×10): 3 mL via INTRAVENOUS

## 2023-09-06 MED ORDER — FENTANYL CITRATE PF 50 MCG/ML IJ SOSY
50.0000 ug | PREFILLED_SYRINGE | Freq: Once | INTRAMUSCULAR | Status: AC
  Administered 2023-09-06: 50 ug via INTRAVENOUS
  Filled 2023-09-06: qty 1

## 2023-09-06 MED ORDER — INSULIN ASPART 100 UNIT/ML IJ SOLN
0.0000 [IU] | Freq: Every day | INTRAMUSCULAR | Status: DC
  Administered 2023-09-07: 5 [IU] via SUBCUTANEOUS
  Administered 2023-09-09: 2 [IU] via SUBCUTANEOUS
  Administered 2023-09-11: 3 [IU] via SUBCUTANEOUS
  Administered 2023-09-12: 2 [IU] via SUBCUTANEOUS
  Administered 2023-09-13: 3 [IU] via SUBCUTANEOUS

## 2023-09-06 MED ORDER — FENTANYL CITRATE (PF) 250 MCG/5ML IJ SOLN
INTRAMUSCULAR | Status: AC
  Filled 2023-09-06: qty 5

## 2023-09-06 MED ORDER — CEFAZOLIN SODIUM-DEXTROSE 2-4 GM/100ML-% IV SOLN
2.0000 g | INTRAVENOUS | Status: AC
  Administered 2023-09-06: 2 g via INTRAVENOUS
  Filled 2023-09-06: qty 100

## 2023-09-06 MED ORDER — ONDANSETRON HCL 4 MG/2ML IJ SOLN
4.0000 mg | Freq: Once | INTRAMUSCULAR | Status: AC
  Administered 2023-09-06: 4 mg via INTRAVENOUS
  Filled 2023-09-06: qty 2

## 2023-09-06 MED ORDER — ACETAMINOPHEN 325 MG PO TABS: 650.0000 mg | ORAL_TABLET | Freq: Four times a day (QID) | ORAL | Status: DC | PRN

## 2023-09-06 SURGICAL SUPPLY — 42 items
BNDG CMPR MED 15X6 ELC VLCR LF (GAUZE/BANDAGES/DRESSINGS) ×1
BNDG CMPR STD VLCR NS LF 5.8X4 (GAUZE/BANDAGES/DRESSINGS) ×1
BNDG ELASTIC 4X5.8 VLCR NS LF (GAUZE/BANDAGES/DRESSINGS) IMPLANT
BNDG ELASTIC 6X15 VLCR STRL LF (GAUZE/BANDAGES/DRESSINGS) IMPLANT
COVER SURGICAL LIGHT HANDLE (MISCELLANEOUS) ×1 IMPLANT
DRAPE U-SHAPE 47X51 STRL (DRAPES) ×1 IMPLANT
ELECT CAUTERY BLADE 6.4 (BLADE) ×1 IMPLANT
ELECT REM PT RETURN 9FT ADLT (ELECTROSURGICAL) ×1
ELECTRODE REM PT RTRN 9FT ADLT (ELECTROSURGICAL) ×1 IMPLANT
GAUZE PAD ABD 8X10 STRL (GAUZE/BANDAGES/DRESSINGS) ×1 IMPLANT
GAUZE SPONGE 4X4 12PLY STRL (GAUZE/BANDAGES/DRESSINGS) ×2 IMPLANT
GAUZE XEROFORM 5X9 LF (GAUZE/BANDAGES/DRESSINGS) ×1 IMPLANT
GLOVE BIOGEL PI IND STRL 7.0 (GLOVE) ×2 IMPLANT
GLOVE BIOGEL PI IND STRL 7.5 (GLOVE) ×3 IMPLANT
GLOVE ECLIPSE 7.0 STRL STRAW (GLOVE) ×1 IMPLANT
GLOVE SKINSENSE STRL SZ7.5 (GLOVE) ×2 IMPLANT
GLOVE SURG SYN 7.5 E (GLOVE) ×2 IMPLANT
GLOVE SURG SYN 7.5 PF PI (GLOVE) ×2 IMPLANT
GLOVE SURG UNDER POLY LF SZ7 (GLOVE) ×19 IMPLANT
GLOVE SURG UNDER POLY LF SZ7.5 (GLOVE) ×4 IMPLANT
GOWN STRL SURGICAL XL XLNG (GOWN DISPOSABLE) ×2 IMPLANT
KIT BASIN OR (CUSTOM PROCEDURE TRAY) ×1 IMPLANT
KIT TURNOVER KIT B (KITS) ×1 IMPLANT
MANIFOLD NEPTUNE II (INSTRUMENTS) ×1 IMPLANT
NDL HYPO 25GX1X1/2 BEV (NEEDLE) IMPLANT
NEEDLE HYPO 25GX1X1/2 BEV (NEEDLE) IMPLANT
NS IRRIG 1000ML POUR BTL (IV SOLUTION) ×1 IMPLANT
PACK ORTHO EXTREMITY (CUSTOM PROCEDURE TRAY) ×1 IMPLANT
PAD ABD 8X10 STRL (GAUZE/BANDAGES/DRESSINGS) IMPLANT
PAD ARMBOARD 7.5X6 YLW CONV (MISCELLANEOUS) ×2 IMPLANT
PAD CAST 4YDX4 CTTN HI CHSV (CAST SUPPLIES) IMPLANT
PADDING CAST ABS COTTON 4X4 ST (CAST SUPPLIES) ×1 IMPLANT
PADDING CAST COTTON 4X4 STRL (CAST SUPPLIES) ×1
PADDING CAST COTTON 6X4 STRL (CAST SUPPLIES) ×1 IMPLANT
SET CYSTO W/LG BORE CLAMP LF (SET/KITS/TRAYS/PACK) IMPLANT
STOCKINETTE 6 STRL (DRAPES) IMPLANT
SUT ETHILON 2 0 FS 18 (SUTURE) ×1 IMPLANT
TOWEL GREEN STERILE (TOWEL DISPOSABLE) ×2 IMPLANT
TOWEL GREEN STERILE FF (TOWEL DISPOSABLE) ×1 IMPLANT
TUBE CONNECTING 12X1/4 (SUCTIONS) ×1 IMPLANT
UNDERPAD 30X36 HEAVY ABSORB (UNDERPADS AND DIAPERS) ×2 IMPLANT
YANKAUER SUCT BULB TIP NO VENT (SUCTIONS) ×1 IMPLANT

## 2023-09-06 NOTE — Anesthesia Preprocedure Evaluation (Addendum)
Anesthesia Evaluation  Patient identified by MRN, date of birth, ID band Patient awake    Reviewed: Allergy & Precautions, NPO status , Patient's Chart, lab work & pertinent test results  Airway Mallampati: II  TM Distance: <3 FB Neck ROM: Full    Dental  (+) Teeth Intact, Dental Advisory Given, Chipped,    Pulmonary former smoker   Pulmonary exam normal breath sounds clear to auscultation       Cardiovascular hypertension, +CHF  Normal cardiovascular exam(-) dysrhythmias (LBBB) + pacemaker + Cardiac Defibrillator  Rhythm:Regular Rate:Normal     Neuro/Psych negative neurological ROS  negative psych ROS   GI/Hepatic Neg liver ROS, PUD,,,UC   Endo/Other  diabetes, Type 2, Insulin Dependent    Renal/GU Renal InsufficiencyRenal disease     Musculoskeletal negative musculoskeletal ROS (+) Arthritis ,  right ankle fracture   Abdominal   Peds  Hematology negative hematology ROS (+)   Anesthesia Other Findings   Reproductive/Obstetrics                             Anesthesia Physical Anesthesia Plan  ASA: 4 and emergent  Anesthesia Plan: General   Post-op Pain Management: Ofirmev IV (intra-op)*   Induction: Intravenous  PONV Risk Score and Plan: 3 and Dexamethasone and Ondansetron  Airway Management Planned: Oral ETT  Additional Equipment:   Intra-op Plan:   Post-operative Plan: Extubation in OR  Informed Consent: I have reviewed the patients History and Physical, chart, labs and discussed the procedure including the risks, benefits and alternatives for the proposed anesthesia with the patient or authorized representative who has indicated his/her understanding and acceptance.     Dental advisory given  Plan Discussed with: CRNA  Anesthesia Plan Comments:        Anesthesia Quick Evaluation

## 2023-09-06 NOTE — ED Notes (Signed)
Surgeon at bedside.  

## 2023-09-06 NOTE — ED Notes (Signed)
Level 2 trauma activation.

## 2023-09-06 NOTE — ED Provider Notes (Signed)
[place] EMERGENCY DEPARTMENT AT [facility] Provider Note   CSN: [id number] Arrival date & time: 09/06/23  2102     History  Chief Complaint  Patient presents with   Laceration   HPI Grace Robinson is a 89 y.o. female presenting for fall and right ankle injury.  States that earlier this evening her right leg felt "like it was going to give out" and she fell "slowly to the ground".  States she hurt her ankle somehow and started to bleed.  Her phone was across the room so she crawled to the other side of the room to to call someone for help.  Patient is not on a blood thinner but does take a daily aspirin.  Denies head injury or loss of consciousness.  States she also has a abrasion on her right elbow.   Laceration      Home Medications Prior to Admission medications   Medication Sig Start Date End Date Taking? Authorizing Provider  acetaminophen (TYLENOL) 500 MG tablet Take 500 mg by mouth every 8 (eight) hours as needed for headache.    [provider]  alendronate (FOSAMAX) 70 MG tablet Take 1 tablet (70 mg total) by mouth every 7 (seven) days. Take with a full glass of water on an empty stomach. 05/19/23   Mast, Man X, NP  aspirin EC 81 MG tablet Take 81 mg by mouth daily. Swallow whole.    [provider]  atorvastatin (LIPITOR) 40 MG tablet Take 40 mg by mouth daily. 04/21/15   [provider]  benzonatate (TESSALON) 200 MG capsule Take 1 capsule (200 mg total) by mouth 2 (two) times daily as needed for cough. 07/08/23   Venita Sheffield, MD  calcium carbonate (CALCIUM 600) 600 MG TABS tablet Take 600 mg by mouth 2 (two) times daily with a meal.    [provider]  Cholecalciferol (VITAMIN D3) 50 MCG (2000 UT) CAPS Take 2 capsules by mouth daily in the afternoon.    [provider]  Continuous Glucose Receiver (DEXCOM G6 RECEIVER) DEVI 1 Device by Does not apply route as directed. 06/02/23   Mast, Man X, NP  Continuous  Glucose Sensor (DEXCOM G6 SENSOR) MISC 1 Device by Does not apply route as directed. 06/02/23   Mast, Man X, NP  Continuous Glucose Transmitter (DEXCOM G6 TRANSMITTER) MISC 1 Device by Does not apply route as directed. 06/02/23   Mast, Man X, NP  Cranberry 500 MG CAPS Take 2 tablets by mouth daily.    [provider]  dicyclomine (BENTYL) 10 MG capsule TAKE 1 CAPSULE BY MOUTH THREE TIMES DAILY 30 MINUTES BEFORE MEALS 11/29/22   Esterwood, [name] S, PA-C  fluticasone (FLONASE) 50 MCG/ACT nasal spray Place 2 sprays into both nostrils daily. 07/08/23   Venita Sheffield, MD  furosemide (LASIX) 20 MG tablet Take 1 tablet (20 mg total) by mouth daily. alternating (40 mg) every other day. 02/08/17   Rosalio Macadamia, NP  insulin glargine, 2 Unit Dial, (TOUJEO MAX SOLOSTAR) 300 UNIT/ML Solostar Pen Inject 50 Units into the skin daily. 07/19/23   Venita Sheffield, MD  loperamide (IMODIUM A-D) 2 MG tablet Take 2 mg by mouth as needed for diarrhea or loose stools.    [provider]  loratadine (CLARITIN) 10 MG tablet Take 1 tablet (10 mg total) by mouth daily. 07/08/23   Venita Sheffield, MD  metoprolol succinate (TOPROL-XL) 100 MG 24 hr tablet Take 100 mg by mouth 2 (two) times  daily. 07/04/13   [provider]  olmesartan (BENICAR) 40 MG tablet Take 40 mg by mouth daily. 01/17/18   [provider]  potassium chloride (K-DUR) 10 MEQ tablet Take 10 mEq by mouth 2 (two) times daily. 04/29/19   [provider]      Allergies    Carvedilol, Digoxin, Vancomycin, and Pseudoephedrine    Review of Systems   See HPI for pertinent positive   Physical Exam   Vitals:   09/06/23 2130 09/06/23 2255  BP: (!) 189/105 (!) 112/53  Pulse: 74 86  Resp: 16 (!) 23  Temp:    SpO2: 97% 100%    CONSTITUTIONAL:  well-appearing, NAD NEURO:  Alert and oriented x 3, CN 3-12 grossly intact EYES:  eyes equal and reactive ENT/NECK:  Supple, no stridor  Chest: non tender CARDIO:   regular rate and rhythm, appears well-perfused  PULM:  No respiratory distress, CTAB GI/GU:  non-distended, soft, atraumatic MSK/SPINE: Open wound at the right ankle with protruding bone (see picture below). Slight bleeding noted. pedal pulses are soft but present.  Sensation intact distally.  Small abrasion about the right elbow. SKIN:  no rash, atraumatic    *Additional and/or pertinent findings included in MDM below    ED Results / Procedures / Treatments   Labs (all labs ordered are listed, but only abnormal results are displayed) Labs Reviewed  BASIC METABOLIC PANEL - Abnormal; Notable for the following components:      Result Value   CO2 21 (*)    Glucose, Bld 353 (*)    BUN 25 (*)    Creatinine, Ser 1.19 (*)    GFR, Estimated 42 (*)    All other components within normal limits  CBC    EKG None  Radiology DG Tibia/Fibula Right  Result Date: 09/06/2023 CLINICAL DATA:  Fall EXAM: RIGHT TIBIA AND FIBULA - 2 VIEW COMPARISON:  None Available. FINDINGS: Oblique fracture through the distal right fibular metaphysis. Widening of the medial ankle mortise. No visible tibial fracture. Chondrocalcinosis and degenerative changes in the knee joint. No joint effusion. IMPRESSION: Distal fibular metaphyseal fracture with widening of the ankle mortise. Electronically Signed   By: Charlett Nose M.D.   On: 09/06/2023 23:05   DG Shoulder Right  Result Date: 09/06/2023 CLINICAL DATA:  Fall EXAM: RIGHT SHOULDER - 2+ VIEW COMPARISON:  None Available. FINDINGS: Degenerative changes in the [facility] joint with joint space narrowing and spurring. Glenohumeral joint is maintained. No acute bony abnormality. Specifically, no fracture, subluxation, or dislocation. Soft tissues are intact. IMPRESSION: Degenerative changes in the [facility] joint.  No acute bony abnormality. Electronically Signed   By: Charlett Nose M.D.   On: 09/06/2023 23:04   DG Forearm Right  Result Date: 09/06/2023 CLINICAL DATA:  Fall EXAM:  RIGHT FOREARM - 2 VIEW COMPARISON:  None Available. FINDINGS: No acute bony abnormality. Specifically, no fracture, subluxation, or dislocation. Chondrocalcinosis in the triangular fibrocartilage. Soft tissues intact. IMPRESSION: No acute bony abnormality. Electronically Signed   By: Charlett Nose M.D.   On: 09/06/2023 23:04   DG Elbow Complete Right  Result Date: 09/06/2023 CLINICAL DATA:  Fall EXAM: RIGHT ELBOW - COMPLETE 3+ VIEW COMPARISON:  None Available. FINDINGS: No acute bony abnormality. Specifically, no fracture, subluxation, or dislocation. No joint effusion. Soft tissues are intact. IMPRESSION: No acute bony abnormality. Electronically Signed   By: Charlett Nose M.D.   On: 09/06/2023 23:03   DG Ankle Right Port  Result Date: 09/06/2023 CLINICAL DATA:  Fall, ankle pain EXAM: PORTABLE RIGHT ANKLE - 2 VIEW COMPARISON:  None Available. FINDINGS: Oblique fracture in the distal right fibular metaphysis. No visible tibial fracture. There is widening of the medial ankle mortise. IMPRESSION: Distal fibular fracture with widening of the medial ankle mortise. Electronically Signed   By: Charlett Nose M.D.   On: 09/06/2023 23:01    Procedures .Critical Care  Performed by: Gareth Eagle, PA-C Authorized by: Gareth Eagle, PA-C   Critical care provider statement:    Critical care time (minutes):  30   Critical care was necessary to treat or prevent imminent or life-threatening deterioration of the following conditions:  Trauma   Critical care was time spent personally by me on the following activities:  Development of treatment plan with patient or surrogate, discussions with consultants, evaluation of patient's response to treatment, examination of patient, ordering and review of laboratory studies, ordering and review of radiographic studies, ordering and performing treatments and interventions, pulse oximetry, re-evaluation of patient's condition and review of old charts     Medications  Ordered in ED Medications  ceFAZolin (ANCEF) IVPB 2g/100 mL premix (0 g Intravenous Stopped 09/06/23 2305)  Tdap (BOOSTRIX) injection 0.5 mL (0.5 mLs Intramuscular Given 09/06/23 2205)  fentaNYL (SUBLIMAZE) injection 50 mcg (50 mcg Intravenous Given 09/06/23 2316)  ondansetron (ZOFRAN) injection 4 mg (4 mg Intravenous Given 09/06/23 2315)    ED Course/ Medical Decision Making/ A&P Clinical Course as of 09/06/23 2350  Tue Sep 06, 2023  2212 DG Tibia/Fibula Right [JR]  2214 DG Tibia/Fibula Right [JR]    Clinical Course User Index [JR] Gareth Eagle, PA-C                                 Medical Decision Making Amount and/or Complexity of Data Reviewed Labs: ordered. Radiology: ordered. Decision-making details documented in ED Course.  Risk Prescription drug management.   89 year old well-appearing female presenting for fall and right ankle injury along with elbow abrasion. Exam notable for what appeared to be likely open fracture of the right ankle.  Xray Distal fibular fracture with widening of the medial ankle mortise but open medially.  Started on cefazolin.  Ordered pain medication.  Contacted Dr. Roda Shutters of orthopedics. Stated that he would evaluate patient in person.  He was able to evaluate and advise you would take her to the OR tonight. During our encounter patient remained overall well-appearing, nontoxic and hemodynamically stable.        Final Clinical Impression(s) / ED Diagnoses Final diagnoses:  Injury of right ankle, initial encounter    Rx / DC Orders ED Discharge Orders     None         Gareth Eagle, PA-C 09/06/23 2350    Benjiman Core, MD 09/08/23 0003

## 2023-09-06 NOTE — ED Notes (Signed)
Trauma Response Nurse Documentation   Grace Robinson is a 89 y.o. female arriving to [facility] ED via [facility]  On No antithrombotic. Trauma was activated as a Level 2 by Eston Esters based on the following trauma criteria Grossly contaminated open fractures.  No CT completed at this time.   GCS 15.  History   Past Medical History:  Diagnosis Date   Biventricular ICD (implantable cardiac defibrillator) in place    downgraded from CRT-D to CRT-P 08/30/22   Breast cancer (HCC)    Cellulitis    Change in bowel habits    CHF (congestive heart failure) (HCC)    Colon polyps    Diabetes mellitus    Type II   Diarrhea    Dyslipidemia    HOH (hard of hearing)    left   HTN (hypertension)    LBBB (left bundle branch block)    chronic   Meniere's disease    Nonischemic cardiomyopathy (HCC) 2006   Presence of permanent cardiac pacemaker    T wave oversensing 01/17/2014   Ulcerative colitis    Varicose veins    4 lazer  treatment each leg     Past Surgical History:  Procedure Laterality Date   ABDOMINAL HYSTERECTOMY     BIV PACEMAKER GENERATOR CHANGEOUT N/A 08/30/2022   Procedure: BIV PACEMAKER GENERATOR CHANGEOUT;  Surgeon: Duke Salvia, MD;  Location: [facility] INVASIVE CV LAB;  Service: Cardiovascular;  Laterality: N/A;   BREAST LUMPECTOMY WITH RADIOACTIVE SEED LOCALIZATION Right 01/05/2023   Procedure: RIGHT BREAST BRACKETED LUMPECTOMY WITH RADIOACTIVE SEED LOCALIZATION;  Surgeon: Abigail Miyamoto, MD;  Location: [facility] OR;  Service: General;  Laterality: Right;   CARDIAC CATHETERIZATION  08/2011   CHOLECYSTECTOMY     COLONOSCOPY     EP Study  08/22/08   IMPLANTABLE CARDIOVERTER DEFIBRILLATOR GENERATOR CHANGE N/A 08/22/2013   Procedure: IMPLANTABLE CARDIOVERTER DEFIBRILLATOR GENERATOR CHANGE;  Surgeon: Duke Salvia, MD;  Location: [facility] CATH LAB;  Service: Cardiovascular;  Laterality: N/A;   KNEE ARTHROSCOPY Left 04/14/2016   Procedure: LEFT KNEE ARTHROSCOPY, PARTIAL  MEDIAL AND PARTIAL LATERAL MENISCECTOMY, MEDIAL PLICA;  Surgeon: Jodi Geralds, MD;  Location: [facility] OR;  Service: Orthopedics;  Laterality: Left;   mastoid sugery     in the setting of Meniere's disease   TONSILLECTOMY AND ADENOIDECTOMY         Initial Focused Assessment (If applicable, or please see trauma documentation): Airway-- intact, no visible obstruction Breathing-- spontaneous, unlabored Circulation-- open fracture of the right ankle, bleeding controlled  CT's Completed:   none   Interventions:  See event summary  Plan for disposition:  {Trauma Dispo:26867}   Consults completed:  Orthopaedic Surgeon at 2239.  Event Summary: Patient brought in by [facility]. Patient was standing and bent over, she felt her ankle give out, was able to lower herself to the ground. On exam patient with open fracture of the right ankle. Patient transferred to hospital stretcher. BP obtained. 20 G PIV LAC established. Lab work obtained. Xray right ankle, right shoulder, right forearm, right elbow, right tibia/fibula completed. Ancef initiated.  MTP Summary (If applicable):  N/A  Bedside handoff with ED RN Durene Cal.    Leota Sauers  Trauma Response RN  Please call TRN at [phone] for further assistance.

## 2023-09-06 NOTE — ED Triage Notes (Signed)
Pt presents via EMS from [facility] c/o laceration to left ankle and skin tear to left forearm. Pt reports her knees "buckled" denies falling. Reports hit ankle and left arm on dresser. Denies LOC. Denies hitting head. Denies taking anticoagulation. A&O x4.

## 2023-09-06 NOTE — Progress Notes (Signed)
   09/06/23 2145  Spiritual Encounters  Type of Visit Initial  Care provided to: Family;Pt not available  Referral source Trauma page  Reason for visit Trauma  OnCall Visit No   Chaplain responded to a level two trauma. The patient, Grace Robinson was attended to by the medical team.  I visited with the patient's son who said he came prepared to stay with [facility]. He commented that the medical team jumped right in and taken good care of his mother.  At this time he did not need chaplain services. I advised him that if he or Grace Robinson wished to talk with a chaplain to let a nurse know and someone would respond.   Valerie Roys [facility]  [phone]

## 2023-09-06 NOTE — Consult Note (Signed)
ORTHOPAEDIC CONSULTATION  REQUESTING PHYSICIAN: Benjiman Core, MD  Chief Complaint: Right ankle injury  HPI: Grace Robinson is a 89 y.o. female who presents with right ankle injury s/p ground level fall at [facility].  She resides there permanently.  She was unable to get up after the fall and had to crawl to help.  She's accompanied by her son at bedside.  She was found to have an open ankle injury in the ED.  Ortho consulted.  Past Medical History:  Diagnosis Date   Biventricular ICD (implantable cardiac defibrillator) in place    downgraded from CRT-D to CRT-P 08/30/22   Breast cancer (HCC)    Cellulitis    Change in bowel habits    CHF (congestive heart failure) (HCC)    Colon polyps    Diabetes mellitus    Type II   Diarrhea    Dyslipidemia    HOH (hard of hearing)    left   HTN (hypertension)    LBBB (left bundle branch block)    chronic   Meniere's disease    Nonischemic cardiomyopathy (HCC) 2006   Presence of permanent cardiac pacemaker    T wave oversensing 01/17/2014   Ulcerative colitis    Varicose veins    4 lazer  treatment each leg   Past Surgical History:  Procedure Laterality Date   ABDOMINAL HYSTERECTOMY     BIV PACEMAKER GENERATOR CHANGEOUT N/A 08/30/2022   Procedure: BIV PACEMAKER GENERATOR CHANGEOUT;  Surgeon: Duke Salvia, MD;  Location: [facility] INVASIVE CV LAB;  Service: Cardiovascular;  Laterality: N/A;   BREAST LUMPECTOMY WITH RADIOACTIVE SEED LOCALIZATION Right 01/05/2023   Procedure: RIGHT BREAST BRACKETED LUMPECTOMY WITH RADIOACTIVE SEED LOCALIZATION;  Surgeon: Abigail Miyamoto, MD;  Location: [facility] OR;  Service: General;  Laterality: Right;   CARDIAC CATHETERIZATION  08/2011   CHOLECYSTECTOMY     COLONOSCOPY     EP Study  08/22/08   IMPLANTABLE CARDIOVERTER DEFIBRILLATOR GENERATOR CHANGE N/A 08/22/2013   Procedure: IMPLANTABLE CARDIOVERTER DEFIBRILLATOR GENERATOR CHANGE;  Surgeon: Duke Salvia, MD;  Location: [facility] CATH LAB;  Service:  Cardiovascular;  Laterality: N/A;   KNEE ARTHROSCOPY Left 04/14/2016   Procedure: LEFT KNEE ARTHROSCOPY, PARTIAL MEDIAL AND PARTIAL LATERAL MENISCECTOMY, MEDIAL PLICA;  Surgeon: Jodi Geralds, MD;  Location: [facility] OR;  Service: Orthopedics;  Laterality: Left;   mastoid sugery     in the setting of Meniere's disease   TONSILLECTOMY AND ADENOIDECTOMY     Social History   Socioeconomic History   Marital status: Single    Spouse name: Not on file   Number of children: 2   Years of education: Not on file   Highest education level: Bachelor's degree (e.g., BA, AB, BS)  Occupational History   Occupation: retired    Comment: Runner, 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    Social Determinants of Health   Financial Resource Strain: Low Risk  (12/08/2022)   Overall Financial Resource Strain (CARDIA)    Difficulty of  Paying Living Expenses: Not hard at all  Food Insecurity: No Food Insecurity (12/08/2022)   Hunger Vital Sign    Worried About Running Out of Food in the Last Year: Never true    Ran Out of Food in the Last Year: Never true  Transportation Needs: No Transportation Needs (12/08/2022)   PRAPARE - Administrator, Civil Service (Medical): No    Lack of Transportation (Non-Medical): No  Physical Activity: Not on file  Stress: Not on file  Social Connections: Not on file   Family History  Problem Relation Age of Onset   Breast cancer Mother 75       met to lungs   Emphysema Father 85   Heart disease Father    Healthy Child    Lung cancer Cousin        maternal first  cousin, she smoked   Colon cancer Neg Hx    Rectal cancer Neg Hx    Stomach cancer Neg Hx    Liver cancer Neg Hx    Esophageal cancer Neg Hx    - negative except otherwise stated in the family history section Allergies  Allergen Reactions   Carvedilol Other (See Comments)    Pt does not remember reaction  Other Reaction(s): Unknown   Digoxin Other (See Comments)    Dig toxicity   Vancomycin Other (See Comments)    ("Red Man Syndrome") Pt states she turned red and was itching  Unknown  ("Red Man Syndrome") Pt states she turned red and was itching    Unknown    ("Red Man Syndrome") Pt states she turned red and was itching   Pseudoephedrine Other (See Comments)    Makes skin feel like its crawling  Makes skin feel like its crawling Unknown Makes skin feel like its crawling  Makes skin feel like its crawling Unknown  Makes skin feel like its crawling    Makes skin feel like its crawling Unknown Makes skin feel like its crawling    Makes skin feel like its crawling, Unknown    Makes skin feel like its crawling Unknown    Makes skin feel like its crawling   Prior to Admission medications   Medication Sig Start Date End Date Taking? Authorizing Provider  acetaminophen (TYLENOL) 500 MG tablet Take 500 mg by mouth every 8 (eight) hours as needed for headache.    [provider]  alendronate (FOSAMAX) 70 MG tablet Take 1 tablet (70 mg total) by mouth every 7 (seven) days. Take with a full glass of water on an empty stomach. 05/19/23   Mast, Man X, NP  aspirin EC 81 MG tablet Take 81 mg by mouth daily. Swallow whole.    [provider]  atorvastatin (LIPITOR) 40 MG tablet Take 40 mg by mouth daily. 04/21/15   [provider]  benzonatate (TESSALON) 200 MG capsule Take 1 capsule (200 mg total) by mouth 2 (two) times daily as needed for cough. 07/08/23   Venita Sheffield, MD  calcium carbonate (CALCIUM 600) 600 MG TABS tablet Take 600 mg by mouth 2 (two)  times daily with a meal.    [provider]  Cholecalciferol (VITAMIN D3) 50 MCG (2000 UT) CAPS Take 2 capsules by mouth daily in the afternoon.    [provider]  Continuous Glucose Receiver (DEXCOM G6 RECEIVER) DEVI 1 Device by Does not apply route as directed. 06/02/23   Mast, Man X, NP  Continuous Glucose Sensor (DEXCOM G6 SENSOR) MISC  1 Device by Does not apply route as directed. 06/02/23   Mast, Man X, NP  Continuous Glucose Transmitter (DEXCOM G6 TRANSMITTER) MISC 1 Device by Does not apply route as directed. 06/02/23   Mast, Man X, NP  Cranberry 500 MG CAPS Take 2 tablets by mouth daily.    [provider]  dicyclomine (BENTYL) 10 MG capsule TAKE 1 CAPSULE BY MOUTH THREE TIMES DAILY 30 MINUTES BEFORE MEALS 11/29/22   Esterwood, [name] S, PA-C  fluticasone (FLONASE) 50 MCG/ACT nasal spray Place 2 sprays into both nostrils daily. 07/08/23   Venita Sheffield, MD  furosemide (LASIX) 20 MG tablet Take 1 tablet (20 mg total) by mouth daily. alternating (40 mg) every other day. 02/08/17   Rosalio Macadamia, NP  insulin glargine, 2 Unit Dial, (TOUJEO MAX SOLOSTAR) 300 UNIT/ML Solostar Pen Inject 50 Units into the skin daily. 07/19/23   Venita Sheffield, MD  loperamide (IMODIUM A-D) 2 MG tablet Take 2 mg by mouth as needed for diarrhea or loose stools.    [provider]  loratadine (CLARITIN) 10 MG tablet Take 1 tablet (10 mg total) by mouth daily. 07/08/23   Venita Sheffield, MD  metoprolol succinate (TOPROL-XL) 100 MG 24 hr tablet Take 100 mg by mouth 2 (two) times daily. 07/04/13   [provider]  olmesartan (BENICAR) 40 MG tablet Take 40 mg by mouth daily. 01/17/18   [provider]  potassium chloride (K-DUR) 10 MEQ tablet Take 10 mEq by mouth 2 (two) times daily. 04/29/19   [provider]   No results found. - pertinent xrays, CT, MRI studies were reviewed and independently interpreted  Positive ROS: All other systems have been  reviewed and were otherwise negative with the exception of those mentioned in the HPI and as above.  Physical Exam: General: No acute distress Cardiovascular: No pedal edema Respiratory: No cyanosis, no use of accessory musculature GI: No organomegaly, abdomen is soft and non-tender Skin: No lesions in the area of chief complaint Neurologic: Sensation intact distally Psychiatric: Patient is at baseline mood and affect Lymphatic: No axillary or cervical lymphadenopathy  MUSCULOSKELETAL:   No open wounds on the lateral ankle. Both feet are cool to touch.  Assessment: Right lateral malleolus fracture dislocation Right ankle syndesmosis disruption  Plan: I irrigated the wounds out thoroughly in the ED but was unable to reduce the ankle enough to relax the tension off of the skin.  Will take to the OR for formal I&D with possible closure and splinting.  Will need definitive ORIF at a later time.    Thank you for the consult and the opportunity to see Ms. Kratz  N. Glee Arvin, MD [facility] 10:56 PM

## 2023-09-06 NOTE — ED Notes (Signed)
Pickering at bedside.  

## 2023-09-06 NOTE — H&P (View-Only) (Signed)
 ORTHOPAEDIC CONSULTATION  REQUESTING PHYSICIAN: Benjiman Core, MD  Chief Complaint: Right ankle injury  HPI: Grace Robinson is a 89 y.o. female who presents with right ankle injury s/p [facility].  She resides there permanently.  She was unable to get up after the fall and had to crawl to help.  She's accompanied by her son at bedside.  She was found to have an open ankle injury in the ED.  Ortho consulted.  Past Medical History:  Diagnosis Date   Biventricular ICD (implantable cardiac defibrillator) in place    downgraded from CRT-D to CRT-P 08/30/22   Breast cancer (HCC)    Cellulitis    Change in bowel habits    CHF (congestive heart failure) (HCC)    Colon polyps    Diabetes mellitus    Type II   Diarrhea    Dyslipidemia    HOH (hard of hearing)    left   HTN (hypertension)    LBBB (left bundle branch block)    chronic   Meniere's disease    Nonischemic cardiomyopathy (HCC) 2006   Presence of permanent cardiac pacemaker    T wave oversensing 01/17/2014   Ulcerative colitis    Varicose veins    4 lazer  treatment each leg   Past Surgical History:  Procedure Laterality Date   ABDOMINAL HYSTERECTOMY     BIV PACEMAKER GENERATOR CHANGEOUT N/A 08/30/2022   Procedure: BIV PACEMAKER GENERATOR CHANGEOUT;  Surgeon: Duke Salvia, MD;  Location: [facility] INVASIVE CV LAB;  Service: Cardiovascular;  Laterality: N/A;   BREAST LUMPECTOMY WITH RADIOACTIVE SEED LOCALIZATION Right 01/05/2023   Procedure: RIGHT BREAST BRACKETED LUMPECTOMY WITH RADIOACTIVE SEED LOCALIZATION;  Surgeon: Abigail Miyamoto, MD;  Location: [facility] OR;  Service: General;  Laterality: Right;   CARDIAC CATHETERIZATION  08/2011   CHOLECYSTECTOMY     COLONOSCOPY     EP Study  08/22/08   IMPLANTABLE CARDIOVERTER DEFIBRILLATOR GENERATOR CHANGE N/A 08/22/2013   Procedure: IMPLANTABLE CARDIOVERTER DEFIBRILLATOR GENERATOR CHANGE;  Surgeon: Duke Salvia, MD;  Location: [facility] CATH LAB;  Service:  Cardiovascular;  Laterality: N/A;   KNEE ARTHROSCOPY Left 04/14/2016   Procedure: LEFT KNEE ARTHROSCOPY, PARTIAL MEDIAL AND PARTIAL LATERAL MENISCECTOMY, MEDIAL PLICA;  Surgeon: Jodi Geralds, MD;  Location: [facility] OR;  Service: Orthopedics;  Laterality: Left;   mastoid sugery     in the setting of Meniere's disease   TONSILLECTOMY AND ADENOIDECTOMY     Social History   Socioeconomic History   Marital status: Single    Spouse name: Not on file   Number of children: 2   Years of education: Not on file   Highest education level: Bachelor's degree (e.g., BA, AB, BS)  Occupational History   Occupation: retired    Comment: Runner, 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    Social Determinants of Health   Financial Resource Strain: Low Risk  (12/08/2022)   Overall Financial Resource Strain (CARDIA)    Difficulty of  Paying Living Expenses: Not hard at all  Food Insecurity: No Food Insecurity (12/08/2022)   Hunger Vital Sign    Worried About Running Out of Food in the Last Year: Never true    Ran Out of Food in the Last Year: Never true  Transportation Needs: No Transportation Needs (12/08/2022)   PRAPARE - Administrator, Civil Service (Medical): No    Lack of Transportation (Non-Medical): No  Physical Activity: Not on file  Stress: Not on file  Social Connections: Not on file   Family History  Problem Relation Age of Onset   Breast cancer Mother 75       met to lungs   Emphysema Father 85   Heart disease Father    Healthy Child    Lung cancer Cousin        maternal first  cousin, she smoked   Colon cancer Neg Hx    Rectal cancer Neg Hx    Stomach cancer Neg Hx    Liver cancer Neg Hx    Esophageal cancer Neg Hx    - negative except otherwise stated in the family history section Allergies  Allergen Reactions   Carvedilol Other (See Comments)    Pt does not remember reaction  Other Reaction(s): Unknown   Digoxin Other (See Comments)    Dig toxicity   Vancomycin Other (See Comments)    ("Red Man Syndrome") Pt states she turned red and was itching  Unknown  ("Red Man Syndrome") Pt states she turned red and was itching    Unknown    ("Red Man Syndrome") Pt states she turned red and was itching   Pseudoephedrine Other (See Comments)    Makes skin feel like its crawling  Makes skin feel like its crawling Unknown Makes skin feel like its crawling  Makes skin feel like its crawling Unknown  Makes skin feel like its crawling    Makes skin feel like its crawling Unknown Makes skin feel like its crawling    Makes skin feel like its crawling, Unknown    Makes skin feel like its crawling Unknown    Makes skin feel like its crawling   Prior to Admission medications   Medication Sig Start Date End Date Taking? Authorizing Provider  acetaminophen (TYLENOL) 500 MG tablet Take 500 mg by mouth every 8 (eight) hours as needed for headache.    [provider]  alendronate (FOSAMAX) 70 MG tablet Take 1 tablet (70 mg total) by mouth every 7 (seven) days. Take with a full glass of water on an empty stomach. 05/19/23   Mast, Man X, NP  aspirin EC 81 MG tablet Take 81 mg by mouth daily. Swallow whole.    [provider]  atorvastatin (LIPITOR) 40 MG tablet Take 40 mg by mouth daily. 04/21/15   [provider]  benzonatate (TESSALON) 200 MG capsule Take 1 capsule (200 mg total) by mouth 2 (two) times daily as needed for cough. 07/08/23   Venita Sheffield, MD  calcium carbonate (CALCIUM 600) 600 MG TABS tablet Take 600 mg by mouth 2 (two)  times daily with a meal.    [provider]  Cholecalciferol (VITAMIN D3) 50 MCG (2000 UT) CAPS Take 2 capsules by mouth daily in the afternoon.    [provider]  Continuous Glucose Receiver (DEXCOM G6 RECEIVER) DEVI 1 Device by Does not apply route as directed. 06/02/23   Mast, Man X, NP  Continuous Glucose Sensor (DEXCOM G6 SENSOR) MISC  1 Device by Does not apply route as directed. 06/02/23   Mast, Man X, NP  Continuous Glucose Transmitter (DEXCOM G6 TRANSMITTER) MISC 1 Device by Does not apply route as directed. 06/02/23   Mast, Man X, NP  Cranberry 500 MG CAPS Take 2 tablets by mouth daily.    [provider]  dicyclomine (BENTYL) 10 MG capsule TAKE 1 CAPSULE BY MOUTH THREE TIMES DAILY 30 MINUTES BEFORE MEALS 11/29/22   Esterwood, [name] S, PA-C  fluticasone (FLONASE) 50 MCG/ACT nasal spray Place 2 sprays into both nostrils daily. 07/08/23   Venita Sheffield, MD  furosemide (LASIX) 20 MG tablet Take 1 tablet (20 mg total) by mouth daily. alternating (40 mg) every other day. 02/08/17   Rosalio Macadamia, NP  insulin glargine, 2 Unit Dial, (TOUJEO MAX SOLOSTAR) 300 UNIT/ML Solostar Pen Inject 50 Units into the skin daily. 07/19/23   Venita Sheffield, MD  loperamide (IMODIUM A-D) 2 MG tablet Take 2 mg by mouth as needed for diarrhea or loose stools.    [provider]  loratadine (CLARITIN) 10 MG tablet Take 1 tablet (10 mg total) by mouth daily. 07/08/23   Venita Sheffield, MD  metoprolol succinate (TOPROL-XL) 100 MG 24 hr tablet Take 100 mg by mouth 2 (two) times daily. 07/04/13   [provider]  olmesartan (BENICAR) 40 MG tablet Take 40 mg by mouth daily. 01/17/18   [provider]  potassium chloride (K-DUR) 10 MEQ tablet Take 10 mEq by mouth 2 (two) times daily. 04/29/19   [provider]   No results found. - pertinent xrays, CT, MRI studies were reviewed and independently interpreted  Positive ROS: All other systems have been  reviewed and were otherwise negative with the exception of those mentioned in the HPI and as above.  Physical Exam: General: No acute distress Cardiovascular: No pedal edema Respiratory: No cyanosis, no use of accessory musculature GI: No organomegaly, abdomen is soft and non-tender Skin: No lesions in the area of chief complaint Neurologic: Sensation intact distally Psychiatric: Patient is at baseline mood and affect Lymphatic: No axillary or cervical lymphadenopathy  MUSCULOSKELETAL:   No open wounds on the lateral ankle. Both feet are cool to touch.  Assessment: Right lateral malleolus fracture dislocation Right ankle syndesmosis disruption  Plan: I irrigated the wounds out thoroughly in the ED but was unable to reduce the ankle enough to relax the tension off of the skin.  Will take to the OR for formal I&D with possible closure and splinting.  Will need definitive ORIF at a later time.    Thank you for the consult and the opportunity to see Ms. Kratz  N. Glee Arvin, MD [facility] 10:56 PM

## 2023-09-06 NOTE — ED Notes (Signed)
Pt transported to PACU 

## 2023-09-07 ENCOUNTER — Inpatient Hospital Stay (HOSPITAL_COMMUNITY): Payer: Medicare Other | Admitting: Anesthesiology

## 2023-09-07 ENCOUNTER — Other Ambulatory Visit: Payer: Self-pay

## 2023-09-07 ENCOUNTER — Encounter (HOSPITAL_COMMUNITY): Payer: Self-pay | Admitting: Orthopaedic Surgery

## 2023-09-07 DIAGNOSIS — S82841C Displaced bimalleolar fracture of right lower leg, initial encounter for open fracture type IIIA, IIIB, or IIIC: Secondary | ICD-10-CM

## 2023-09-07 DIAGNOSIS — I509 Heart failure, unspecified: Secondary | ICD-10-CM | POA: Diagnosis not present

## 2023-09-07 DIAGNOSIS — E1122 Type 2 diabetes mellitus with diabetic chronic kidney disease: Secondary | ICD-10-CM

## 2023-09-07 DIAGNOSIS — N189 Chronic kidney disease, unspecified: Secondary | ICD-10-CM

## 2023-09-07 DIAGNOSIS — I130 Hypertensive heart and chronic kidney disease with heart failure and stage 1 through stage 4 chronic kidney disease, or unspecified chronic kidney disease: Secondary | ICD-10-CM

## 2023-09-07 DIAGNOSIS — S9304XA Dislocation of right ankle joint, initial encounter: Secondary | ICD-10-CM | POA: Diagnosis not present

## 2023-09-07 DIAGNOSIS — S82831C Other fracture of upper and lower end of right fibula, initial encounter for open fracture type IIIA, IIIB, or IIIC: Secondary | ICD-10-CM | POA: Diagnosis not present

## 2023-09-07 DIAGNOSIS — S91001A Unspecified open wound, right ankle, initial encounter: Secondary | ICD-10-CM | POA: Diagnosis present

## 2023-09-07 LAB — BASIC METABOLIC PANEL
Anion gap: 11 (ref 5–15)
BUN: 22 mg/dL (ref 8–23)
CO2: 25 mmol/L (ref 22–32)
Calcium: 8.7 mg/dL — ABNORMAL LOW (ref 8.9–10.3)
Chloride: 103 mmol/L (ref 98–111)
Creatinine, Ser: 0.99 mg/dL (ref 0.44–1.00)
GFR, Estimated: 53 mL/min — ABNORMAL LOW (ref >60)
Glucose, Bld: 207 mg/dL — ABNORMAL HIGH (ref 70–99)
Potassium: 3.2 mmol/L — ABNORMAL LOW (ref 3.5–5.1)
Sodium: 139 mmol/L (ref 135–145)

## 2023-09-07 LAB — CBC
HCT: 38.1 % (ref 36.0–46.0)
Hemoglobin: 12.8 g/dL (ref 12.0–15.0)
MCH: 31.4 pg (ref 26.0–34.0)
MCHC: 33.6 g/dL (ref 30.0–36.0)
MCV: 93.6 fL (ref 80.0–100.0)
Platelets: 141 10*3/uL — ABNORMAL LOW (ref 150–400)
RBC: 4.07 MIL/uL (ref 3.87–5.11)
RDW: 13.6 % (ref 11.5–15.5)
WBC: 9.3 10*3/uL (ref 4.0–10.5)
nRBC: 0 % (ref 0.0–0.2)

## 2023-09-07 LAB — GLUCOSE, CAPILLARY
Glucose-Capillary: 171 mg/dL — ABNORMAL HIGH (ref 70–99)
Glucose-Capillary: 178 mg/dL — ABNORMAL HIGH (ref 70–99)
Glucose-Capillary: 190 mg/dL — ABNORMAL HIGH (ref 70–99)
Glucose-Capillary: 191 mg/dL — ABNORMAL HIGH (ref 70–99)
Glucose-Capillary: 193 mg/dL — ABNORMAL HIGH (ref 70–99)
Glucose-Capillary: 199 mg/dL — ABNORMAL HIGH (ref 70–99)
Glucose-Capillary: 302 mg/dL — ABNORMAL HIGH (ref 70–99)
Glucose-Capillary: 362 mg/dL — ABNORMAL HIGH (ref 70–99)

## 2023-09-07 LAB — MAGNESIUM: Magnesium: 1.7 mg/dL (ref 1.7–2.4)

## 2023-09-07 MED ORDER — ONDANSETRON HCL 4 MG/2ML IJ SOLN
INTRAMUSCULAR | Status: AC
  Filled 2023-09-07: qty 2

## 2023-09-07 MED ORDER — HYDRALAZINE HCL 20 MG/ML IJ SOLN
5.0000 mg | Freq: Once | INTRAMUSCULAR | Status: AC
  Administered 2023-09-07: 5 mg via INTRAVENOUS

## 2023-09-07 MED ORDER — CEFAZOLIN SODIUM-DEXTROSE 1-4 GM/50ML-% IV SOLN
1.0000 g | Freq: Three times a day (TID) | INTRAVENOUS | Status: DC
  Administered 2023-09-07 – 2023-09-09 (×6): 1 g via INTRAVENOUS
  Administered 2023-09-09: 2 g via INTRAVENOUS
  Filled 2023-09-07 (×12): qty 50

## 2023-09-07 MED ORDER — ONDANSETRON HCL 4 MG/2ML IJ SOLN
INTRAMUSCULAR | Status: DC | PRN
  Administered 2023-09-07: 4 mg via INTRAVENOUS

## 2023-09-07 MED ORDER — FENTANYL CITRATE (PF) 100 MCG/2ML IJ SOLN
INTRAMUSCULAR | Status: AC
  Filled 2023-09-07: qty 2

## 2023-09-07 MED ORDER — DEXAMETHASONE SODIUM PHOSPHATE 10 MG/ML IJ SOLN
INTRAMUSCULAR | Status: DC | PRN
  Administered 2023-09-07: 4 mg via INTRAVENOUS

## 2023-09-07 MED ORDER — SODIUM CHLORIDE 0.9 % IR SOLN
Status: DC | PRN
  Administered 2023-09-07: 3000 mL

## 2023-09-07 MED ORDER — DEXAMETHASONE SODIUM PHOSPHATE 10 MG/ML IJ SOLN
INTRAMUSCULAR | Status: AC
  Filled 2023-09-07: qty 1

## 2023-09-07 MED ORDER — LIDOCAINE 2% (20 MG/ML) 5 ML SYRINGE
INTRAMUSCULAR | Status: AC
  Filled 2023-09-07: qty 5

## 2023-09-07 MED ORDER — PROPOFOL 10 MG/ML IV BOLUS
INTRAVENOUS | Status: AC
  Filled 2023-09-07: qty 20

## 2023-09-07 MED ORDER — PROPOFOL 10 MG/ML IV BOLUS
INTRAVENOUS | Status: DC | PRN
  Administered 2023-09-07: 70 mg via INTRAVENOUS

## 2023-09-07 MED ORDER — CEFAZOLIN SODIUM-DEXTROSE 2-3 GM-%(50ML) IV SOLR
INTRAVENOUS | Status: DC | PRN
  Administered 2023-09-07: 2 g via INTRAVENOUS

## 2023-09-07 MED ORDER — FENTANYL CITRATE (PF) 250 MCG/5ML IJ SOLN
INTRAMUSCULAR | Status: DC | PRN
  Administered 2023-09-07: 50 ug via INTRAVENOUS

## 2023-09-07 MED ORDER — HALOPERIDOL LACTATE 5 MG/ML IJ SOLN
2.0000 mg | Freq: Four times a day (QID) | INTRAMUSCULAR | Status: DC | PRN
  Administered 2023-09-08: 2 mg via INTRAVENOUS
  Filled 2023-09-07 (×3): qty 1

## 2023-09-07 MED ORDER — INSULIN ASPART 100 UNIT/ML IJ SOLN
5.0000 [IU] | Freq: Once | INTRAMUSCULAR | Status: AC
  Administered 2023-09-07: 5 [IU] via SUBCUTANEOUS

## 2023-09-07 MED ORDER — HEPARIN SODIUM (PORCINE) 5000 UNIT/ML IJ SOLN
5000.0000 [IU] | Freq: Three times a day (TID) | INTRAMUSCULAR | Status: DC
  Administered 2023-09-07 – 2023-09-14 (×18): 5000 [IU] via SUBCUTANEOUS
  Filled 2023-09-07 (×20): qty 1

## 2023-09-07 MED ORDER — HYDRALAZINE HCL 20 MG/ML IJ SOLN
INTRAMUSCULAR | Status: AC
  Filled 2023-09-07: qty 1

## 2023-09-07 MED ORDER — SODIUM CHLORIDE 0.9 % IV SOLN: INTRAVENOUS | Status: DC

## 2023-09-07 MED ORDER — FENTANYL CITRATE (PF) 100 MCG/2ML IJ SOLN
25.0000 ug | INTRAMUSCULAR | Status: DC | PRN
  Administered 2023-09-07: 25 ug via INTRAVENOUS

## 2023-09-07 MED ORDER — ONDANSETRON HCL 4 MG/2ML IJ SOLN: 4.0000 mg | Freq: Once | INTRAMUSCULAR | Status: DC | PRN

## 2023-09-07 MED ORDER — ARTIFICIAL TEARS OPHTHALMIC OINT
TOPICAL_OINTMENT | OPHTHALMIC | Status: AC
  Filled 2023-09-07: qty 3.5

## 2023-09-07 MED ORDER — SUCCINYLCHOLINE CHLORIDE 200 MG/10ML IV SOSY
PREFILLED_SYRINGE | INTRAVENOUS | Status: DC | PRN
  Administered 2023-09-07: 60 mg via INTRAVENOUS

## 2023-09-07 MED ORDER — SUCCINYLCHOLINE CHLORIDE 200 MG/10ML IV SOSY
PREFILLED_SYRINGE | INTRAVENOUS | Status: AC
  Filled 2023-09-07: qty 10

## 2023-09-07 MED ORDER — LACTATED RINGERS IV SOLN: INTRAVENOUS | Status: DC | PRN

## 2023-09-07 MED ORDER — LIDOCAINE 2% (20 MG/ML) 5 ML SYRINGE
INTRAMUSCULAR | Status: DC | PRN
  Administered 2023-09-07: 60 mg via INTRAVENOUS

## 2023-09-07 MED ORDER — ACETAMINOPHEN 10 MG/ML IV SOLN
INTRAVENOUS | Status: DC | PRN
  Administered 2023-09-07: 1000 mg via INTRAVENOUS

## 2023-09-07 NOTE — Assessment & Plan Note (Addendum)
-   EF recovered - Appears compensated

## 2023-09-07 NOTE — Assessment & Plan Note (Addendum)
-   A1c was 7.5% in April 2024  - Check CBGs, continue long-acting insulin, add SSI for now -Decadron given during surgery on 09/09/2023.  Some elevation expected and should trend back to appropriate ranges - Continue current insulin dosing for now

## 2023-09-07 NOTE — Assessment & Plan Note (Addendum)
-   Continue metoprolol, hold losartan initially given increase in creatinine -BP also noted to be soft this morning, continue holding losartan

## 2023-09-07 NOTE — Anesthesia Procedure Notes (Signed)
Procedure Name: Intubation Date/Time: 09/07/2023 12:11 AM  Performed by: Tressia Miners, CRNAPre-anesthesia Checklist: Patient identified, Emergency Drugs available, Suction available, Patient being monitored and Timeout performed Patient Re-evaluated:Patient Re-evaluated prior to induction Oxygen Delivery Method: Circle system utilized Preoxygenation: Pre-oxygenation with 100% oxygen Induction Type: IV induction Ventilation: Mask ventilation without difficulty Laryngoscope Size: Glidescope and 3 Grade View: Grade II Tube type: Oral Number of attempts: 1 Airway Equipment and Method: Stylet Placement Confirmation: ETT inserted through vocal cords under direct vision, positive ETCO2 and breath sounds checked- equal and bilateral Secured at: 20 cm Tube secured with: Tape Dental Injury: Injury to tongue and Teeth and Oropharynx as per pre-operative assessment  Difficulty Due To: Difficulty was unanticipated, Difficult Airway- due to anterior larynx and Difficult Airway- due to immobile epiglottis Comments: Eyes taped. Easy mask. DL x 2. Initial DL with MAC 3 gd 3 view, switched to Glide 3 with grade 2a view. Small tongue laceration noted when removing glidescope. Gauze placed and small amount of bleeding ceased.

## 2023-09-07 NOTE — Progress Notes (Signed)
Subjective: 1 Day Post-Op Procedure(s) (LRB): IRRIGATION AND DEBRIDEMENT RIGHT ANKLE (Right) CAST APPLICATION (Right) Patient reports pain as mild.    Objective: Vital signs in last 24 hours: Temp:  [97.6 F (36.4 C)-99.1 F (37.3 C)] 97.6 F (36.4 C) (11/06 0726) Pulse Rate:  [65-86] 83 (11/06 0726) Resp:  [11-23] 19 (11/06 0726) BP: (112-194)/(50-182) 163/79 (11/06 0726) SpO2:  [97 %-100 %] 99 % (11/06 0726) FiO2 (%):  [0 %] 0 % (11/06 0114) Weight:  [63.3 kg] 63.3 kg (11/06 0500)  Intake/Output from previous day: 11/05 0701 - 11/06 0700 In: 937.2 [I.V.:787.2; IV Piggyback:150] Out: 20 [Blood:20] Intake/Output this shift: No intake/output data recorded.  Recent Labs    09/06/23 2148 09/07/23 0539  HGB 13.4 12.8   Recent Labs    09/06/23 2148 09/07/23 0539  WBC 8.4 9.3  RBC 4.28 4.07  HCT 40.9 38.1  PLT 154 141*   Recent Labs    09/06/23 2148 09/07/23 0539  NA 139 139  K 3.6 3.2*  CL 103 103  CO2 21* 25  BUN 25* 22  CREATININE 1.19* 0.99  GLUCOSE 353* 207*  CALCIUM 9.2 8.7*   No results for input(s): "LABPT", "INR" in the last 72 hours.  Neurologically intact Neurovascular intact Sensation intact distally Intact pulses distally Dorsiflexion/Plantar flexion intact Well-fitted short-leg splint in place.  Able to wiggle toes.     Assessment/Plan: 1 Day Post-Op Procedure(s) (LRB): IRRIGATION AND DEBRIDEMENT RIGHT ANKLE (Right) CAST APPLICATION (Right) PLAN NWB RLE Ice and elevate May resume Lovenox/heparin 12 hours post-op Dr. Lajoyce Corners to see later today and will plan for definitive surgery this Friday May have diet from ortho standpoint       Cristie Hem 09/07/2023, 8:06 AM

## 2023-09-07 NOTE — Progress Notes (Signed)
Pt arrived on the unit from PACU. Alert and oriented x4, with family at the bed side. Vital signs within normal. Pt admitted to [facility]. Pt stated pain level of 0 . Call bell within reach. No s/s of distress with this assessment.

## 2023-09-07 NOTE — Op Note (Signed)
   Date of Surgery: 09/07/2023  INDICATIONS: Ms. Payeur is a 89 y.o.-year-old female with a right open ankle fracture dislocation.  The patient did consent to the procedure after discussion of the risks and benefits.  PREOPERATIVE DIAGNOSIS: Type 3a open fracture dislocation of right ankle  POSTOPERATIVE DIAGNOSIS: Same.  PROCEDURE:  Irrigation and debridement of bone, periosteum, subcutaneous tissue, skin associated with open right ankle fracture dislocation Complex repair right ankle traumatic lacerations 15 cm Application of short leg splint  Debridement type: Excisional Debridement  Side: right  Body Location: Ankle  Tools used for debridement: scalpel and rongeur  Irrigation volume: 3 L     Irrigation fluid type: Normal Saline  SURGEON: N. Glee Arvin, M.D.  ASSIST: None  ANESTHESIA:  general  IV FLUIDS AND URINE: See anesthesia.  ESTIMATED BLOOD LOSS: 50 mL.  IMPLANTS: None  DRAINS: None  COMPLICATIONS: see description of procedure.  DESCRIPTION OF PROCEDURE: The patient was brought to the operating room.  The patient had been signed prior to the procedure and this was documented. The patient had the anesthesia placed by the anesthesiologist.  A time-out was performed to confirm that this was the correct patient, site, side and location. The patient did receive antibiotics prior to the incision and was re-dosed during the procedure as needed at indicated intervals. The patient had the operative extremity prepped and draped in the standard surgical fashion.    Sharp excisional debridement of the traumatic lacerations associated with the open ankle fracture dislocation was first performed.  I used a scalpel and rondure and sharply debrided devitalized skin and subcutaneous tissue and periosteum that was stripped.  The open wound did communicate to the medial ankle joint.  There was no foreign bodies.  The open sites were thoroughly irrigated with 3 L of normal saline.   The ankle was then reduced back into the wounds.  Complex repair of the traumatic lacerations was accomplished with using 2-0 nylon and far near near far pattern.  I was happy that I was able to approximate the skin edges and to provide coverage over the distal tibia.  Sterile dressings were applied.  Short leg splint was placed.  Patient tolerated procedure well had no immediate complications.  POSTOPERATIVE PLAN: Patient will be admitted to the hospitalist service.  She will need definitive ORIF.  She will be nonweightbearing to the right lower extremity.  She can receive Lovenox or subcu heparin for DVT prophylaxis starting 12 hours postop.  Mayra Reel, MD 12:49 AM

## 2023-09-07 NOTE — Hospital Course (Addendum)
Grace Robinson is a 89 yo female with PMH CHF s/p ICD, DMII, HLD, HoH, HTN, Meniere's disease, UC who presented after a mechanical fall at home.  She described feeling her legs as becoming weak and buckling causing her to fall onto her right side.  She was unable to ambulate after falling and crawled along the floor to obtain her phone.  She was then brought to the hospital for further evaluation. Right leg x-ray showed open distal fibular fracture.  She was evaluated by orthopedic surgery and taken to the OR for I&D of the open fracture and repair of lacerations.  She then underwent ORIF on 09/09/2023.

## 2023-09-07 NOTE — Assessment & Plan Note (Addendum)
-   s/p mechanical fall at home; no description of syncope at this time.  - s/p I&D and laceration repair with ortho on 11/6 - s/p ORIF on 11/8 - WBAT to RLE with CAM boot - wound vac to remain x 1 week per ortho rec's -Outpatient follow-up with orthopedic surgery, Dr. Lajoyce Corners 1 week after surgery -Evaluated by PT/OT.  SNF recommended for rehab

## 2023-09-07 NOTE — Transfer of Care (Signed)
Immediate Anesthesia Transfer of Care Note  Patient: Grace Robinson  Procedure(s) Performed: IRRIGATION AND DEBRIDEMENT RIGHT ANKLE (Right: Ankle) CAST APPLICATION (Right: Ankle)  Patient Location: PACU  Anesthesia Type:General  Level of Consciousness: awake and alert   Airway & Oxygen Therapy: Patient Spontanous Breathing  Post-op Assessment: Report given to RN and Post -op Vital signs reviewed and stable  Post vital signs: Reviewed and stable  Last Vitals:  Vitals Value Taken Time  BP 182/92 09/07/23 0111  Temp    Pulse 65 09/07/23 0114  Resp 14 09/07/23 0114  SpO2 98 % 09/07/23 0114  Vitals shown include unfiled device data.  Last Pain:  Vitals:   09/06/23 2316  TempSrc:   PainSc: 6          Complications:  Encounter Notable Events  Notable Event Outcome Phase Comment  Difficult to intubate - unexpected  Intraprocedure Filed from anesthesia note documentation.

## 2023-09-07 NOTE — Progress Notes (Addendum)
Pt from [facility], s/p fall with right open ankle fracture dislocation. 11/6- s/p debridement with plan for possible OR on Friday with Dr. Lajoyce Corners for repair. TOC will continue to monitor patient advancement through interdisciplinary progression rounds. If new patient transition needs arise, please place a TOC consult.     09/07/23 1202  TOC Brief Assessment  Insurance and Status Reviewed  Patient has primary care physician Yes  Home environment has been reviewed lives at [facility]- in apartment  Prior level of function: independent  Prior/Current Home Services No current home services  Social Determinants of Health Reivew SDOH reviewed no interventions necessary  Readmission risk has been reviewed Yes  Transition of care needs transition of care needs identified, TOC will continue to follow

## 2023-09-07 NOTE — Progress Notes (Signed)
Progress Note    Grace Robinson   XBJ:[id number]  DOB: 1926-02-23  DOA: 09/06/2023     1 PCP: Mast, Man X, NP  Initial CC: fall at home  Hospital Course: Grace Robinson is a 89 yo female with PMH CHF s/p ICD, DMII, HLD, HoH, HTN, Meniere's disease, UC who presented after a mechanical fall at home.  She described feeling her legs as becoming weak and buckling causing her to fall onto her right side.  She was unable to ambulate after falling and crawled along the floor to obtain her phone.  She was then brought to the hospital for further evaluation. Right leg x-ray showed open distal fibular fracture.  She was evaluated by orthopedic surgery and taken to the OR for I&D of the open fracture and repair of lacerations.  Tentative plan for ORIF planned for 09/09/2023.  Interval History:  Resting in bed when seen this morning in no distress.  Son present bedside.  She tolerated debridement well overnight.  Understands plan is for further surgical repair on Friday.  Assessment and Plan: * Open fracture of right distal fibula - s/p mechanical fall at home; no description of syncope at this time.  - s/p I&D and laceration repair with ortho on 11/6 - tentative plan for ORIF on 11/8 - continue NWB to RLE - continue pain control  Type 2 diabetes mellitus with renal complication (HCC) - A1c was 7.5% in April 2024  - Check CBGs, continue long-acting insulin, add SSI for now  NICM (nonischemic cardiomyopathy) (HCC) - EF recovered - Appears compensated  - Hold Lasix and ARB initially given increase in BUN & creatinine, monitor weight and I/Os, continue beta-blocker as tolerated  HTN (hypertension) - Continue metoprolol, hold losartan initially given increase in creatinine    Old records reviewed in assessment of this patient  Antimicrobials:   DVT prophylaxis:  heparin injection 5,000 Units Start: 09/07/23 1400 SCDs Start: 09/06/23 2352   Code Status:   Code Status: Limited: Do not attempt  resuscitation (DNR) -DNR-LIMITED -Do Not Intubate/DNI   Mobility Assessment (Last 72 Hours)     Mobility Assessment     Row Name 09/07/23 0300           Does patient have an order for bedrest or is patient medically unstable No - Continue assessment       What is the highest level of mobility based on the progressive mobility assessment? Level 5 (Walks with assist in room/hall) - Balance while stepping forward/back and can walk in room with assist - Complete                Barriers to discharge: none Disposition Plan:  Pending PT eval after surgery, likely needs SNF Status is: Inpt  Objective: Blood pressure 114/71, pulse 72, temperature 97.9 F (36.6 C), temperature source Oral, resp. rate 20, weight 63.3 kg, SpO2 98%.  Examination:  Physical Exam Constitutional:      Appearance: Normal appearance.     Comments: Hard of hearing noted but able to still interact easily  HENT:     Head: Normocephalic and atraumatic.     Mouth/Throat:     Mouth: Mucous membranes are moist.  Eyes:     Extraocular Movements: Extraocular movements intact.  Cardiovascular:     Rate and Rhythm: Normal rate and regular rhythm.  Pulmonary:     Effort: Pulmonary effort is normal. No respiratory distress.     Breath sounds: Normal breath sounds. No wheezing.  Abdominal:     General: Bowel sounds are normal. There is no distension.     Palpations: Abdomen is soft.     Tenderness: There is no abdominal tenderness.  Musculoskeletal:     Cervical back: Normal range of motion and neck supple.     Comments: Right leg in surgical dressings   Skin:    General: Skin is warm and dry.  Neurological:     General: No focal deficit present.     Mental Status: She is alert.  Psychiatric:        Mood and Affect: Mood normal.      Consultants:  Orthopedic surgery  Procedures:  09/07/23:  PROCEDURE:  Irrigation and debridement of bone, periosteum, subcutaneous tissue, skin associated with open  right ankle fracture dislocation Complex repair right ankle traumatic lacerations 15 cm Application of short leg splint  Data Reviewed: Results for orders placed or performed during the hospital encounter of 09/06/23 (from the past 24 hour(s))  CBC     Status: None   Collection Time: 09/06/23  9:48 PM  Result Value Ref Range   WBC 8.4 4.0 - 10.5 K/uL   RBC 4.28 3.87 - 5.11 MIL/uL   Hemoglobin 13.4 12.0 - 15.0 g/dL   HCT 91.4 78.2 - 95.6 %   MCV 95.6 80.0 - 100.0 fL   MCH 31.3 26.0 - 34.0 pg   MCHC 32.8 30.0 - 36.0 g/dL   RDW 21.3 08.6 - 57.8 %   Platelets 154 150 - 400 K/uL   nRBC 0.0 0.0 - 0.2 %  Basic metabolic panel     Status: Abnormal   Collection Time: 09/06/23  9:48 PM  Result Value Ref Range   Sodium 139 135 - 145 mmol/L   Potassium 3.6 3.5 - 5.1 mmol/L   Chloride 103 98 - 111 mmol/L   CO2 21 (L) 22 - 32 mmol/L   Glucose, Bld 353 (H) 70 - 99 mg/dL   BUN 25 (H) 8 - 23 mg/dL   Creatinine, Ser 4.69 (H) 0.44 - 1.00 mg/dL   Calcium 9.2 8.9 - 62.9 mg/dL   GFR, Estimated 42 (L) >60 mL/min   Anion gap 15 5 - 15  Glucose, capillary     Status: Abnormal   Collection Time: 09/07/23 12:18 AM  Result Value Ref Range   Glucose-Capillary 362 (H) 70 - 99 mg/dL   Comment 1 Call MD NNP PA CNM   Glucose, capillary     Status: Abnormal   Collection Time: 09/07/23  1:32 AM  Result Value Ref Range   Glucose-Capillary 302 (H) 70 - 99 mg/dL  Basic metabolic panel     Status: Abnormal   Collection Time: 09/07/23  5:39 AM  Result Value Ref Range   Sodium 139 135 - 145 mmol/L   Potassium 3.2 (L) 3.5 - 5.1 mmol/L   Chloride 103 98 - 111 mmol/L   CO2 25 22 - 32 mmol/L   Glucose, Bld 207 (H) 70 - 99 mg/dL   BUN 22 8 - 23 mg/dL   Creatinine, Ser 5.28 0.44 - 1.00 mg/dL   Calcium 8.7 (L) 8.9 - 10.3 mg/dL   GFR, Estimated 53 (L) >60 mL/min   Anion gap 11 5 - 15  Magnesium     Status: None   Collection Time: 09/07/23  5:39 AM  Result Value Ref Range   Magnesium 1.7 1.7 - 2.4 mg/dL  CBC      Status: Abnormal   Collection Time:  09/07/23  5:39 AM  Result Value Ref Range   WBC 9.3 4.0 - 10.5 K/uL   RBC 4.07 3.87 - 5.11 MIL/uL   Hemoglobin 12.8 12.0 - 15.0 g/dL   HCT 44.0 34.7 - 42.5 %   MCV 93.6 80.0 - 100.0 fL   MCH 31.4 26.0 - 34.0 pg   MCHC 33.6 30.0 - 36.0 g/dL   RDW 95.6 38.7 - 56.4 %   Platelets 141 (L) 150 - 400 K/uL   nRBC 0.0 0.0 - 0.2 %  Glucose, capillary     Status: Abnormal   Collection Time: 09/07/23  6:41 AM  Result Value Ref Range   Glucose-Capillary 190 (H) 70 - 99 mg/dL  Glucose, capillary     Status: Abnormal   Collection Time: 09/07/23  7:25 AM  Result Value Ref Range   Glucose-Capillary 191 (H) 70 - 99 mg/dL  Glucose, capillary     Status: Abnormal   Collection Time: 09/07/23 11:18 AM  Result Value Ref Range   Glucose-Capillary 193 (H) 70 - 99 mg/dL    I have reviewed pertinent nursing notes, vitals, labs, and images as necessary. I have ordered labwork to follow up on as indicated.  I have reviewed the last notes from staff over past 24 hours. I have discussed patient's care plan and test results with nursing staff, CM/SW, and other staff as appropriate.  Time spent: Greater than 50% of the 55 minute visit was spent in counseling/coordination of care for the patient as laid out in the A&P.   LOS: 1 day   Lewie Chamber, MD [facility] 09/07/2023, 2:04 PM

## 2023-09-07 NOTE — Anesthesia Postprocedure Evaluation (Signed)
Anesthesia Post Note  Patient: Grace Robinson  Procedure(s) Performed: IRRIGATION AND DEBRIDEMENT RIGHT ANKLE (Right: Ankle) CAST APPLICATION (Right: Ankle)     Patient location during evaluation: PACU Anesthesia Type: General Level of consciousness: awake and alert Pain management: pain level controlled Vital Signs Assessment: post-procedure vital signs reviewed and stable Respiratory status: spontaneous breathing, nonlabored ventilation and respiratory function stable Cardiovascular status: blood pressure returned to baseline and stable Postop Assessment: no apparent nausea or vomiting Anesthetic complications: yes   Encounter Notable Events  Notable Event Outcome Phase Comment  Difficult to intubate - unexpected  Intraprocedure Filed from anesthesia note documentation.    Last Vitals:  Vitals:   09/07/23 0243 09/07/23 0244  BP:  (!) 144/53  Pulse:  67  Resp:  11  Temp: 36.9 C 36.7 C  SpO2:  97%    Last Pain:  Vitals:   09/07/23 0244  TempSrc: Oral  PainSc:                  Grace Robinson

## 2023-09-08 DIAGNOSIS — S82831C Other fracture of upper and lower end of right fibula, initial encounter for open fracture type IIIA, IIIB, or IIIC: Secondary | ICD-10-CM | POA: Diagnosis not present

## 2023-09-08 DIAGNOSIS — E876 Hypokalemia: Secondary | ICD-10-CM | POA: Insufficient documentation

## 2023-09-08 LAB — BASIC METABOLIC PANEL
Anion gap: 10 (ref 5–15)
BUN: 25 mg/dL — ABNORMAL HIGH (ref 8–23)
CO2: 24 mmol/L (ref 22–32)
Calcium: 8.5 mg/dL — ABNORMAL LOW (ref 8.9–10.3)
Chloride: 106 mmol/L (ref 98–111)
Creatinine, Ser: 0.93 mg/dL (ref 0.44–1.00)
GFR, Estimated: 57 mL/min — ABNORMAL LOW (ref >60)
Glucose, Bld: 199 mg/dL — ABNORMAL HIGH (ref 70–99)
Potassium: 3.3 mmol/L — ABNORMAL LOW (ref 3.5–5.1)
Sodium: 140 mmol/L (ref 135–145)

## 2023-09-08 LAB — CBC
HCT: 33.4 % — ABNORMAL LOW (ref 36.0–46.0)
Hemoglobin: 11.3 g/dL — ABNORMAL LOW (ref 12.0–15.0)
MCH: 32.3 pg (ref 26.0–34.0)
MCHC: 33.8 g/dL (ref 30.0–36.0)
MCV: 95.4 fL (ref 80.0–100.0)
Platelets: 137 10*3/uL — ABNORMAL LOW (ref 150–400)
RBC: 3.5 MIL/uL — ABNORMAL LOW (ref 3.87–5.11)
RDW: 14.1 % (ref 11.5–15.5)
WBC: 12.5 10*3/uL — ABNORMAL HIGH (ref 4.0–10.5)
nRBC: 0 % (ref 0.0–0.2)

## 2023-09-08 LAB — GLUCOSE, CAPILLARY
Glucose-Capillary: 191 mg/dL — ABNORMAL HIGH (ref 70–99)
Glucose-Capillary: 274 mg/dL — ABNORMAL HIGH (ref 70–99)
Glucose-Capillary: 149 mg/dL — ABNORMAL HIGH (ref 70–99)

## 2023-09-08 LAB — MAGNESIUM: Magnesium: 2 mg/dL (ref 1.7–2.4)

## 2023-09-08 MED ORDER — POTASSIUM CHLORIDE 20 MEQ PO PACK
40.0000 meq | PACK | Freq: Once | ORAL | Status: AC
  Administered 2023-09-08: 40 meq via ORAL
  Filled 2023-09-08: qty 2

## 2023-09-08 MED ORDER — HYDRALAZINE HCL 25 MG PO TABS
25.0000 mg | ORAL_TABLET | ORAL | Status: DC | PRN
  Administered 2023-09-09: 25 mg via ORAL
  Filled 2023-09-08: qty 1

## 2023-09-08 NOTE — Progress Notes (Signed)
Progress Note    Grace Robinson   NFA:[id number]  DOB: 30-Sep-1926  DOA: 09/06/2023     2 PCP: Mast, Man X, NP  Initial CC: fall at home  Hospital Course: Grace Robinson is a 89 yo female with PMH CHF s/p ICD, DMII, HLD, HoH, HTN, Meniere's disease, UC who presented after a mechanical fall at home.  She described feeling her legs as becoming weak and buckling causing her to fall onto her right side.  She was unable to ambulate after falling and crawled along the floor to obtain her phone.  She was then brought to the hospital for further evaluation. Right leg x-ray showed open distal fibular fracture.  She was evaluated by orthopedic surgery and taken to the OR for I&D of the open fracture and repair of lacerations.  Tentative plan for ORIF planned for 09/09/2023.  Interval History:  No events overnight.  Some sundowning/delirium.  Haldol given.  This morning sitting in chair and is cooperative and pleasant.  Assessment and Plan: * Open fracture of right distal fibula - s/p mechanical fall at home; no description of syncope at this time.  - s/p I&D and laceration repair with ortho on 11/6 - tentative plan for ORIF on 11/8 - continue NWB to RLE - continue pain control  Type 2 diabetes mellitus with renal complication (HCC) - A1c was 7.5% in April 2024  - Check CBGs, continue long-acting insulin, add SSI for now  NICM (nonischemic cardiomyopathy) (HCC) - EF recovered - Appears compensated  - Hold Lasix and ARB initially given increase in BUN & creatinine, monitor weight and I/Os, continue beta-blocker as tolerated  HTN (hypertension) - Continue metoprolol, hold losartan initially given increase in creatinine    Old records reviewed in assessment of this patient  Antimicrobials:   DVT prophylaxis:  heparin injection 5,000 Units Start: 09/07/23 1400 SCDs Start: 09/06/23 2352   Code Status:   Code Status: Limited: Do not attempt resuscitation (DNR) -DNR-LIMITED -Do Not  Intubate/DNI   Mobility Assessment (Last 72 Hours)     Mobility Assessment     Row Name 09/08/23 0900 09/07/23 2015 09/07/23 0800 09/07/23 0300     Does patient have an order for bedrest or is patient medically unstable No - Continue assessment No - Continue assessment No - Continue assessment No - Continue assessment    What is the highest level of mobility based on the progressive mobility assessment? Level 2 (Chairfast) - Balance while sitting on edge of bed and cannot stand Level 1 (Bedfast) - Unable to balance while sitting on edge of bed Level 1 (Bedfast) - Unable to balance while sitting on edge of bed Level 5 (Walks with assist in room/hall) - Balance while stepping forward/back and can walk in room with assist - Complete    Is the above level different from baseline mobility prior to current illness? Yes - Recommend PT order Yes - Recommend PT order Yes - Recommend PT order --             Barriers to discharge: none Disposition Plan:  Pending PT eval after surgery, likely needs SNF Status is: Inpt  Objective: Blood pressure (!) 152/66, pulse 68, temperature 98.2 F (36.8 C), temperature source Oral, resp. rate 20, weight 63.3 kg, SpO2 96%.  Examination:  Physical Exam Constitutional:      Appearance: Normal appearance.     Comments: Hard of hearing noted but able to still interact easily  HENT:     Head:  Normocephalic and atraumatic.     Mouth/Throat:     Mouth: Mucous membranes are moist.  Eyes:     Extraocular Movements: Extraocular movements intact.  Cardiovascular:     Rate and Rhythm: Normal rate and regular rhythm.  Pulmonary:     Effort: Pulmonary effort is normal. No respiratory distress.     Breath sounds: Normal breath sounds. No wheezing.  Abdominal:     General: Bowel sounds are normal. There is no distension.     Palpations: Abdomen is soft.     Tenderness: There is no abdominal tenderness.  Musculoskeletal:     Cervical back: Normal range of motion  and neck supple.     Comments: Right leg in surgical dressings   Skin:    General: Skin is warm and dry.  Neurological:     General: No focal deficit present.     Mental Status: She is alert.  Psychiatric:        Mood and Affect: Mood normal.      Consultants:  Orthopedic surgery  Procedures:  09/07/23:  PROCEDURE:  Irrigation and debridement of bone, periosteum, subcutaneous tissue, skin associated with open right ankle fracture dislocation Complex repair right ankle traumatic lacerations 15 cm Application of short leg splint  Data Reviewed: Results for orders placed or performed during the hospital encounter of 09/06/23 (from the past 24 hour(s))  Glucose, capillary     Status: Abnormal   Collection Time: 09/07/23  4:23 PM  Result Value Ref Range   Glucose-Capillary 178 (H) 70 - 99 mg/dL  Glucose, capillary     Status: Abnormal   Collection Time: 09/07/23  6:23 PM  Result Value Ref Range   Glucose-Capillary 171 (H) 70 - 99 mg/dL  Glucose, capillary     Status: Abnormal   Collection Time: 09/07/23  9:05 PM  Result Value Ref Range   Glucose-Capillary 199 (H) 70 - 99 mg/dL  Basic metabolic panel     Status: Abnormal   Collection Time: 09/08/23  6:15 AM  Result Value Ref Range   Sodium 140 135 - 145 mmol/L   Potassium 3.3 (L) 3.5 - 5.1 mmol/L   Chloride 106 98 - 111 mmol/L   CO2 24 22 - 32 mmol/L   Glucose, Bld 199 (H) 70 - 99 mg/dL   BUN 25 (H) 8 - 23 mg/dL   Creatinine, Ser 1.61 0.44 - 1.00 mg/dL   Calcium 8.5 (L) 8.9 - 10.3 mg/dL   GFR, Estimated 57 (L) >60 mL/min   Anion gap 10 5 - 15  CBC     Status: Abnormal   Collection Time: 09/08/23  6:15 AM  Result Value Ref Range   WBC 12.5 (H) 4.0 - 10.5 K/uL   RBC 3.50 (L) 3.87 - 5.11 MIL/uL   Hemoglobin 11.3 (L) 12.0 - 15.0 g/dL   HCT 09.6 (L) 04.5 - 40.9 %   MCV 95.4 80.0 - 100.0 fL   MCH 32.3 26.0 - 34.0 pg   MCHC 33.8 30.0 - 36.0 g/dL   RDW 81.1 91.4 - 78.2 %   Platelets 137 (L) 150 - 400 K/uL   nRBC 0.0 0.0  - 0.2 %  Magnesium     Status: None   Collection Time: 09/08/23  6:15 AM  Result Value Ref Range   Magnesium 2.0 1.7 - 2.4 mg/dL  Glucose, capillary     Status: Abnormal   Collection Time: 09/08/23  7:34 AM  Result Value Ref Range   Glucose-Capillary  191 (H) 70 - 99 mg/dL    I have reviewed pertinent nursing notes, vitals, labs, and images as necessary. I have ordered labwork to follow up on as indicated.  I have reviewed the last notes from staff over past 24 hours. I have discussed patient's care plan and test results with nursing staff, CM/SW, and other staff as appropriate.    LOS: 2 days   Lewie Chamber, MD [facility] 09/08/2023, 2:27 PM

## 2023-09-08 NOTE — Assessment & Plan Note (Signed)
-   Replete as needed 

## 2023-09-09 ENCOUNTER — Other Ambulatory Visit: Payer: Self-pay

## 2023-09-09 ENCOUNTER — Inpatient Hospital Stay (HOSPITAL_COMMUNITY): Payer: Medicare Other | Admitting: Anesthesiology

## 2023-09-09 ENCOUNTER — Encounter: Payer: Medicare Other | Admitting: Sports Medicine

## 2023-09-09 ENCOUNTER — Inpatient Hospital Stay (HOSPITAL_COMMUNITY): Payer: Medicare Other

## 2023-09-09 ENCOUNTER — Encounter (HOSPITAL_COMMUNITY)
Admission: EM | Disposition: A | Discharge status: Skilled Nursing Facility | Payer: Self-pay | Source: Skilled Nursing Facility | Attending: Internal Medicine

## 2023-09-09 DIAGNOSIS — S91001A Unspecified open wound, right ankle, initial encounter: Secondary | ICD-10-CM | POA: Diagnosis not present

## 2023-09-09 DIAGNOSIS — E119 Type 2 diabetes mellitus without complications: Secondary | ICD-10-CM | POA: Diagnosis not present

## 2023-09-09 DIAGNOSIS — S9304XA Dislocation of right ankle joint, initial encounter: Secondary | ICD-10-CM | POA: Diagnosis not present

## 2023-09-09 DIAGNOSIS — S82891A Other fracture of right lower leg, initial encounter for closed fracture: Secondary | ICD-10-CM | POA: Diagnosis not present

## 2023-09-09 DIAGNOSIS — S82851C Displaced trimalleolar fracture of right lower leg, initial encounter for open fracture type IIIA, IIIB, or IIIC: Principal | ICD-10-CM | POA: Insufficient documentation

## 2023-09-09 DIAGNOSIS — S82831C Other fracture of upper and lower end of right fibula, initial encounter for open fracture type IIIA, IIIB, or IIIC: Secondary | ICD-10-CM | POA: Diagnosis not present

## 2023-09-09 HISTORY — PX: ORIF ANKLE FRACTURE: SHX5408

## 2023-09-09 LAB — BASIC METABOLIC PANEL
Anion gap: 8 (ref 5–15)
BUN: 23 mg/dL (ref 8–23)
CO2: 25 mmol/L (ref 22–32)
Calcium: 8.7 mg/dL — ABNORMAL LOW (ref 8.9–10.3)
Chloride: 108 mmol/L (ref 98–111)
Creatinine, Ser: 0.94 mg/dL (ref 0.44–1.00)
GFR, Estimated: 56 mL/min — ABNORMAL LOW (ref >60)
Glucose, Bld: 143 mg/dL — ABNORMAL HIGH (ref 70–99)
Potassium: 4.3 mmol/L (ref 3.5–5.1)
Sodium: 141 mmol/L (ref 135–145)

## 2023-09-09 LAB — CBC
HCT: 34.9 % — ABNORMAL LOW (ref 36.0–46.0)
Hemoglobin: 11.6 g/dL — ABNORMAL LOW (ref 12.0–15.0)
MCH: 32.4 pg (ref 26.0–34.0)
MCHC: 33.2 g/dL (ref 30.0–36.0)
MCV: 97.5 fL (ref 80.0–100.0)
Platelets: 136 10*3/uL — ABNORMAL LOW (ref 150–400)
RBC: 3.58 MIL/uL — ABNORMAL LOW (ref 3.87–5.11)
RDW: 14.1 % (ref 11.5–15.5)
WBC: 11.9 10*3/uL — ABNORMAL HIGH (ref 4.0–10.5)
nRBC: 0 % (ref 0.0–0.2)

## 2023-09-09 LAB — GLUCOSE, CAPILLARY
Glucose-Capillary: 144 mg/dL — ABNORMAL HIGH (ref 70–99)
Glucose-Capillary: 102 mg/dL — ABNORMAL HIGH (ref 70–99)
Glucose-Capillary: 88 mg/dL (ref 70–99)
Glucose-Capillary: 123 mg/dL — ABNORMAL HIGH (ref 70–99)
Glucose-Capillary: 234 mg/dL — ABNORMAL HIGH (ref 70–99)

## 2023-09-09 LAB — MRSA NEXT GEN BY PCR, NASAL: MRSA by PCR Next Gen: NOT DETECTED

## 2023-09-09 LAB — MAGNESIUM: Magnesium: 2.1 mg/dL (ref 1.7–2.4)

## 2023-09-09 SURGERY — OPEN REDUCTION INTERNAL FIXATION (ORIF) ANKLE FRACTURE
Anesthesia: Regional | Site: Ankle | Laterality: Right

## 2023-09-09 MED ORDER — CHLORHEXIDINE GLUCONATE 4 % EX SOLN
60.0000 mL | Freq: Once | CUTANEOUS | Status: DC
  Filled 2023-09-09: qty 60

## 2023-09-09 MED ORDER — FENTANYL CITRATE (PF) 100 MCG/2ML IJ SOLN: 50.0000 ug | Freq: Once | INTRAMUSCULAR | Status: AC

## 2023-09-09 MED ORDER — CHLORHEXIDINE GLUCONATE 0.12 % MT SOLN
15.0000 mL | Freq: Once | OROMUCOSAL | Status: AC
  Filled 2023-09-09: qty 15

## 2023-09-09 MED ORDER — METOCLOPRAMIDE HCL 5 MG/ML IJ SOLN: 5.0000 mg | Freq: Three times a day (TID) | INTRAMUSCULAR | Status: DC | PRN

## 2023-09-09 MED ORDER — OXYCODONE HCL 5 MG PO TABS: 10.0000 mg | ORAL_TABLET | ORAL | Status: DC | PRN

## 2023-09-09 MED ORDER — AMISULPRIDE (ANTIEMETIC) 5 MG/2ML IV SOLN: 10.0000 mg | Freq: Once | INTRAVENOUS | Status: DC | PRN

## 2023-09-09 MED ORDER — ORAL CARE MOUTH RINSE: 15.0000 mL | Freq: Once | OROMUCOSAL | Status: AC

## 2023-09-09 MED ORDER — ONDANSETRON HCL 4 MG/2ML IJ SOLN: 4.0000 mg | Freq: Once | INTRAMUSCULAR | Status: DC | PRN

## 2023-09-09 MED ORDER — SODIUM CHLORIDE 0.9% FLUSH
10.0000 mL | Freq: Two times a day (BID) | INTRAVENOUS | Status: DC
  Administered 2023-09-10 – 2023-09-11 (×3): 10 mL via INTRAVENOUS

## 2023-09-09 MED ORDER — PHENYLEPHRINE HCL-NACL 20-0.9 MG/250ML-% IV SOLN
INTRAVENOUS | Status: DC | PRN
  Administered 2023-09-09: 20 ug/min via INTRAVENOUS

## 2023-09-09 MED ORDER — METOCLOPRAMIDE HCL 5 MG PO TABS: 5.0000 mg | ORAL_TABLET | Freq: Three times a day (TID) | ORAL | Status: DC | PRN

## 2023-09-09 MED ORDER — POVIDONE-IODINE 10 % EX SWAB
2.0000 | Freq: Once | CUTANEOUS | Status: AC
  Administered 2023-09-09: 2 via TOPICAL

## 2023-09-09 MED ORDER — FENTANYL CITRATE (PF) 100 MCG/2ML IJ SOLN
INTRAMUSCULAR | Status: AC
  Administered 2023-09-09: 50 ug via INTRAVENOUS
  Filled 2023-09-09: qty 2

## 2023-09-09 MED ORDER — HYDROMORPHONE HCL 1 MG/ML IJ SOLN: 0.5000 mg | INTRAMUSCULAR | Status: DC | PRN

## 2023-09-09 MED ORDER — OXYCODONE HCL 5 MG PO TABS
5.0000 mg | ORAL_TABLET | ORAL | Status: DC | PRN
Start: 2023-09-09 — End: 2023-09-14

## 2023-09-09 MED ORDER — OXYCODONE HCL 5 MG PO TABS: 5.0000 mg | ORAL_TABLET | Freq: Once | ORAL | Status: DC | PRN

## 2023-09-09 MED ORDER — ACETAMINOPHEN 325 MG PO TABS
325.0000 mg | ORAL_TABLET | Freq: Four times a day (QID) | ORAL | Status: DC | PRN
  Administered 2023-09-12: 650 mg via ORAL
  Filled 2023-09-09: qty 2

## 2023-09-09 MED ORDER — OXYCODONE HCL 5 MG/5ML PO SOLN: 5.0000 mg | Freq: Once | ORAL | Status: DC | PRN

## 2023-09-09 MED ORDER — BUPIVACAINE LIPOSOME 1.3 % IJ SUSP
INTRAMUSCULAR | Status: DC | PRN
Start: 2023-09-09 — End: 2023-09-09
  Administered 2023-09-09: 10 mL via PERINEURAL

## 2023-09-09 MED ORDER — PHENYLEPHRINE 80 MCG/ML (10ML) SYRINGE FOR IV PUSH (FOR BLOOD PRESSURE SUPPORT)
PREFILLED_SYRINGE | INTRAVENOUS | Status: DC | PRN
  Administered 2023-09-09: 80 ug via INTRAVENOUS

## 2023-09-09 MED ORDER — CEFAZOLIN SODIUM-DEXTROSE 2-4 GM/100ML-% IV SOLN: 2.0000 g | INTRAVENOUS | Status: DC

## 2023-09-09 MED ORDER — ACETAMINOPHEN 500 MG PO TABS
ORAL_TABLET | ORAL | Status: AC
  Administered 2023-09-09: 1000 mg via ORAL
  Filled 2023-09-09: qty 2

## 2023-09-09 MED ORDER — LIDOCAINE 2% (20 MG/ML) 5 ML SYRINGE
INTRAMUSCULAR | Status: DC | PRN
  Administered 2023-09-09: 40 mg via INTRAVENOUS

## 2023-09-09 MED ORDER — 0.9 % SODIUM CHLORIDE (POUR BTL) OPTIME
TOPICAL | Status: DC | PRN
  Administered 2023-09-09: 1000 mL

## 2023-09-09 MED ORDER — PROPOFOL 10 MG/ML IV BOLUS
INTRAVENOUS | Status: DC | PRN
  Administered 2023-09-09: 100 mg via INTRAVENOUS
  Administered 2023-09-09: 30 mg via INTRAVENOUS

## 2023-09-09 MED ORDER — DOCUSATE SODIUM 100 MG PO CAPS
100.0000 mg | ORAL_CAPSULE | Freq: Two times a day (BID) | ORAL | Status: DC
  Administered 2023-09-11 – 2023-09-14 (×6): 100 mg via ORAL
  Filled 2023-09-09 (×10): qty 1

## 2023-09-09 MED ORDER — ACETAMINOPHEN 500 MG PO TABS
1000.0000 mg | ORAL_TABLET | Freq: Once | ORAL | Status: AC
  Filled 2023-09-09: qty 2

## 2023-09-09 MED ORDER — ROPIVACAINE HCL 5 MG/ML IJ SOLN
INTRAMUSCULAR | Status: DC | PRN
Start: 2023-09-09 — End: 2023-09-09
  Administered 2023-09-09: 30 mL via PERINEURAL

## 2023-09-09 MED ORDER — HYDRALAZINE HCL 20 MG/ML IJ SOLN
10.0000 mg | INTRAMUSCULAR | Status: DC | PRN
  Administered 2023-09-09 – 2023-09-12 (×4): 10 mg via INTRAVENOUS
  Filled 2023-09-09 (×4): qty 1

## 2023-09-09 MED ORDER — CEFAZOLIN SODIUM-DEXTROSE 1-4 GM/50ML-% IV SOLN
1.0000 g | Freq: Four times a day (QID) | INTRAVENOUS | Status: AC
  Administered 2023-09-09 – 2023-09-10 (×3): 1 g via INTRAVENOUS
  Filled 2023-09-09 (×3): qty 50

## 2023-09-09 MED ORDER — FENTANYL CITRATE (PF) 100 MCG/2ML IJ SOLN: 25.0000 ug | INTRAMUSCULAR | Status: DC | PRN

## 2023-09-09 MED ORDER — LACTATED RINGERS IV SOLN
INTRAVENOUS | Status: DC
Start: 2023-09-09 — End: 2023-09-09

## 2023-09-09 MED ORDER — DEXAMETHASONE SODIUM PHOSPHATE 10 MG/ML IJ SOLN
INTRAMUSCULAR | Status: DC | PRN
  Administered 2023-09-09: 10 mg via INTRAVENOUS

## 2023-09-09 MED ORDER — ONDANSETRON HCL 4 MG/2ML IJ SOLN
INTRAMUSCULAR | Status: DC | PRN
  Administered 2023-09-09: 4 mg via INTRAVENOUS

## 2023-09-09 MED ORDER — CHLORHEXIDINE GLUCONATE 0.12 % MT SOLN
OROMUCOSAL | Status: AC
  Administered 2023-09-09: 15 mL via OROMUCOSAL
  Filled 2023-09-09: qty 15

## 2023-09-09 SURGICAL SUPPLY — 46 items
BAG COUNTER SPONGE SURGICOUNT (BAG) ×1 IMPLANT
BAG SPNG CNTER NS LX DISP (BAG) ×1
BANDAGE ESMARK 6X9 LF (GAUZE/BANDAGES/DRESSINGS) IMPLANT
BIT DRILL 2.5X2.75 QC CALB (BIT) IMPLANT
BNDG CMPR 5X4 CHSV STRCH STRL (GAUZE/BANDAGES/DRESSINGS) ×1
BNDG CMPR 9X6 STRL LF SNTH (GAUZE/BANDAGES/DRESSINGS)
BNDG COHESIVE 4X5 TAN STRL (GAUZE/BANDAGES/DRESSINGS) ×1 IMPLANT
BNDG COHESIVE 4X5 TAN STRL LF (GAUZE/BANDAGES/DRESSINGS) IMPLANT
BNDG ESMARK 6X9 LF (GAUZE/BANDAGES/DRESSINGS)
BNDG GAUZE DERMACEA FLUFF 4 (GAUZE/BANDAGES/DRESSINGS) ×1 IMPLANT
BNDG GZE DERMACEA 4 6PLY (GAUZE/BANDAGES/DRESSINGS)
COVER SURGICAL LIGHT HANDLE (MISCELLANEOUS) ×1 IMPLANT
DRAPE DERMATAC (DRAPES) IMPLANT
DRAPE OEC MINIVIEW 54X84 (DRAPES) IMPLANT
DRAPE U-SHAPE 47X51 STRL (DRAPES) ×1 IMPLANT
DRSG ADAPTIC 3X8 NADH LF (GAUZE/BANDAGES/DRESSINGS) ×1 IMPLANT
DURAPREP 26ML APPLICATOR (WOUND CARE) ×1 IMPLANT
ELECT REM PT RETURN 9FT ADLT (ELECTROSURGICAL) ×1
ELECTRODE REM PT RTRN 9FT ADLT (ELECTROSURGICAL) ×1 IMPLANT
GAUZE PAD ABD 8X10 STRL (GAUZE/BANDAGES/DRESSINGS) ×1 IMPLANT
GAUZE SPONGE 4X4 12PLY STRL (GAUZE/BANDAGES/DRESSINGS) ×1 IMPLANT
GLOVE BIOGEL PI IND STRL 9 (GLOVE) ×1 IMPLANT
GLOVE SURG ORTHO 9.0 STRL STRW (GLOVE) ×1 IMPLANT
GOWN STRL REUS W/ TWL XL LVL3 (GOWN DISPOSABLE) ×3 IMPLANT
GOWN STRL REUS W/TWL XL LVL3 (GOWN DISPOSABLE) ×1
GRAFT SKIN WND MICRO 38 (Tissue) IMPLANT
KIT BASIN OR (CUSTOM PROCEDURE TRAY) ×1 IMPLANT
KIT TURNOVER KIT B (KITS) ×1 IMPLANT
MANIFOLD NEPTUNE II (INSTRUMENTS) ×1 IMPLANT
NS IRRIG 1000ML POUR BTL (IV SOLUTION) ×1 IMPLANT
PACK ORTHO EXTREMITY (CUSTOM PROCEDURE TRAY) ×1 IMPLANT
PAD ARMBOARD 7.5X6 YLW CONV (MISCELLANEOUS) ×2 IMPLANT
PLATE ACE 100DEG 7HOLE (Plate) IMPLANT
PREVENA RESTOR AXIOFORM 29X28 (GAUZE/BANDAGES/DRESSINGS) IMPLANT
SCREW CORTICAL 3.5MM 12MM (Screw) IMPLANT
SCREW CORTICAL 3.5MM 16MM (Screw) IMPLANT
SCREW CORTICAL 3.5MM 18MM (Screw) IMPLANT
SCREW CORTICAL 3.5MM 46MM (Screw) IMPLANT
STAPLER VISISTAT 35W (STAPLE) IMPLANT
SUCTION TUBE FRAZIER 10FR DISP (SUCTIONS) ×1 IMPLANT
SUT ETHILON 2 0 PSLX (SUTURE) IMPLANT
SUT VIC AB 2-0 CT1 27 (SUTURE) ×1
SUT VIC AB 2-0 CT1 TAPERPNT 27 (SUTURE) ×1 IMPLANT
TOWEL GREEN STERILE (TOWEL DISPOSABLE) ×1 IMPLANT
TOWEL GREEN STERILE FF (TOWEL DISPOSABLE) ×1 IMPLANT
TUBE CONNECTING 12X1/4 (SUCTIONS) ×1 IMPLANT

## 2023-09-09 NOTE — Progress Notes (Signed)
CCC Pre-op Review  Pre-op checklist: To be completed by bedside RN  NPO: yes  Labs: PCR neg  Consent: ordered  H&P: Hospitalist  Vitals: BP elevated  O2 requirements: RA  MAR/PTA review: BB given at 0911, heparin given at 1407, No GLPs  IV: 22g   Floor nurse name:  Almond Lint, RN   Additional info:

## 2023-09-09 NOTE — Interval H&P Note (Signed)
History and Physical Interval Note:  09/09/2023 12:52 PM  Grace Robinson  has presented today for surgery, with the diagnosis of RIGHT ANKLE FRACTURE.  The various methods of treatment have been discussed with the patient and family. After consideration of risks, benefits and other options for treatment, the patient has consented to  Procedure(s): OPEN REDUCTION INTERNAL FIXATION (ORIF) ANKLE FRACTURE (Right) as a surgical intervention.  The patient's history has been reviewed, patient examined, no change in status, stable for surgery.  I have reviewed the patient's chart and labs.  Questions were answered to the patient's satisfaction.     [name]

## 2023-09-09 NOTE — Care Management Important Message (Signed)
Important Message  Patient Details  Name: Grace Robinson MRN: [id number] Date of Birth: 13-May-1926   Important Message Given:  Yes - Medicare IM     Dorena Bodo 09/09/2023, 3:20 PM

## 2023-09-09 NOTE — Op Note (Signed)
09/09/2023  3:13 PM  PATIENT:  Grace Robinson    PRE-OPERATIVE DIAGNOSIS:  RIGHT ANKLE FRACTURE, trimalleolar with syndesmotic injury, type IIIa.  POST-OPERATIVE DIAGNOSIS:  Same  PROCEDURE:  OPEN REDUCTION INTERNAL FIXATION (ORIF) fibula FRACTURE. Internal fixation syndesmosis. Application Kerecis micro graft 38 cm. Application axial form wound VAC dressing.  SURGEON:  [name], MD  PHYSICIAN ASSISTANT:None ANESTHESIA:   General  PREOPERATIVE INDICATIONS:  [name] is a  89 y.o. female with a diagnosis of RIGHT ANKLE FRACTURE who failed conservative measures and elected for surgical management.    The risks benefits and alternatives were discussed with the patient preoperatively including but not limited to the risks of infection, bleeding, nerve injury, cardiopulmonary complications, the need for revision surgery, among others, and the patient was willing to proceed.  OPERATIVE IMPLANTS:   Implant Name Type Inv. Item Serial No. Manufacturer Lot No. LRB No. Used Action  GRAFT SKIN WND MICRO 38 - [id number] Tissue GRAFT SKIN WND MICRO 38  [employer] [id number] Right 1 Implanted  PLATE ACE 132GMW 7HOLE - [id number] Plate PLATE ACE [id number] 7HOLE  [employer](ORTH,TRAU,BIO,SG)  Right 1 Implanted  SCREW CORTICAL 3.5MM - [id number] Screw SCREW CORTICAL 3.5MM  [employer](ORTH,TRAU,BIO,SG)  Right 3 Implanted  SCREW CORTICAL 3.5MM - [id number] Screw SCREW CORTICAL 3.5MM  [employer](ORTH,TRAU,BIO,SG)  Right 1 Implanted  SCREW CORTICAL 3.5MM - [id number] Screw SCREW CORTICAL 3.5MM  [employer](ORTH,TRAU,BIO,SG)  Right 1 Implanted  SCREW CORTICAL 3.5MM - [id number] Screw SCREW CORTICAL 3.5MM  [employer](ORTH,TRAU,BIO,SG)  Right 1 Implanted    @ENCIMAGES @  OPERATIVE FINDINGS: See en face be verified reduction of the syndesmosis and fibular fracture.  There was no ischemic changes to the traumatic wound medially.  OPERATIVE  PROCEDURE: Patient was brought the operating room and underwent a regional block.  She then underwent a general anesthetic.  After adequate levels anesthesia obtained patient's right lower extremity was prepped using DuraPrep draped into a sterile field a timeout was called.  A lateral incision was made over the fibula and this was carried down to bone and subperiosteal dissection was used to debride the fracture.  This was a Weber C fracture with unstable syndesmosis.  The fibula was brought out to length cleansed debrided clamped and an interfrag screw was placed to stabilize the fracture.  An antiglide plate was then placed posterior laterally secured proximally with 3 screws distally with 1 screw.  C-arm fluoroscopy was used to verify reduction and stabilize the syndesmosis with a 46 mm screw.  The wound was irrigated with normal saline.  The soft tissue was reinforced with 38 cm of Kerecis micro graft.  Part of the graft tissue was also used over the traumatic wound medially.  Modified horizontal mattress or suture was used with 2-0 nylon and 4 near near far sutures to take tension off the skin.  Both wounds were covered with the axial form wound VAC this was secured with derma tack this had a good suction fit this was overwrapped with Coban patient was extubated taken the PACU in stable condition   DISCHARGE PLANNING:  Antibiotic duration: Continue antibiotics for 24 hours  Weightbearing: Weightbearing as tolerated in the cam boot  Pain medication: Opioid pathway  Dressing care/ Wound VAC: Wound VAC for 1 week  Ambulatory devices: Walker  Discharge to: Anticipate discharge back to assisted living or skilled nursing level of care.  Follow-up: In the office 1 week post operative.

## 2023-09-09 NOTE — Anesthesia Procedure Notes (Signed)
Anesthesia Regional Block: Adductor canal block   Pre-Anesthetic Checklist: , timeout performed,  Correct Patient, Correct Site, Correct Laterality,  Correct Procedure, Correct Position, site marked,  Risks and benefits discussed,  Surgical consent,  Pre-op evaluation,  At surgeon's request and post-op pain management  Laterality: Right  Prep: Maximum Sterile Barrier Precautions used, chloraprep       Needles:  Injection technique: Single-shot  Needle Type: Echogenic Stimulator Needle     Needle Length: 9cm  Needle Gauge: 22     Additional Needles:   Procedures:,,,, ultrasound used (permanent image in chart),,    Narrative:  Start time: 09/09/2023 1:13 PM End time: 09/09/2023 1:15 PM Injection made incrementally with aspirations every 5 mL.  Performed by: Personally  Anesthesiologist: [name], DO  Additional Notes: Monitors applied. No increased pain on injection. No increased resistance to injection. Injection made in 5cc increments. Good needle visualization. Patient tolerated procedure well.

## 2023-09-09 NOTE — Anesthesia Procedure Notes (Signed)
Procedure Name: LMA Insertion Date/Time: 09/09/2023 2:06 PM  Performed by: Allyn Kenner, CRNAPre-anesthesia Checklist: Patient identified, Emergency Drugs available, Suction available and Patient being monitored Patient Re-evaluated:Patient Re-evaluated prior to induction Oxygen Delivery Method: Circle System Utilized Preoxygenation: Pre-oxygenation with 100% oxygen Induction Type: IV induction Ventilation: Mask ventilation without difficulty LMA: LMA inserted LMA Size: 3.0 Number of attempts: 1 Airway Equipment and Method: Bite block Placement Confirmation: positive ETCO2 Tube secured with: Tape Dental Injury: Teeth and Oropharynx as per pre-operative assessment

## 2023-09-09 NOTE — Progress Notes (Signed)
Orthopedic Tech Progress Note Patient Details:  [name] [facility] 1926-02-08 [id number]  Ortho Devices Type of Ortho Device: CAM walker Ortho Device/Splint Location: RLE Ortho Device/Splint Interventions: Ordered   Post Interventions Patient Tolerated: Well  Grace Robinson 09/09/2023, 5:37 PM

## 2023-09-09 NOTE — Anesthesia Preprocedure Evaluation (Signed)
Anesthesia Evaluation  Patient identified by MRN, date of birth, ID band Patient awake    Reviewed: Allergy & Precautions, H&P , NPO status , Patient's Chart, lab work & pertinent test results, reviewed documented beta blocker date and time   Airway Mallampati: II  TM Distance: >3 FB Neck ROM: Full    Dental  (+) Teeth Intact, Dental Advisory Given   Pulmonary former smoker   Pulmonary exam normal breath sounds clear to auscultation       Cardiovascular hypertension (183/83 preop), Pt. on medications and Pt. on home beta blockers +CHF (hx NICM now with recovered LVEF since 2015)  Normal cardiovascular exam+ dysrhythmias + pacemaker + Cardiac Defibrillator (biV ppm)  Rhythm:Regular Rate:Normal  Last echo 2015 - Left ventricle: The cavity size was normal. Systolic function was    normal. The estimated ejection fraction was in the range of 50%    to 55%. There was an increased relative contribution of atrial    contraction to ventricular filling. Doppler parameters are    consistent with abnormal left ventricular relaxation (grade 1    diastolic dysfunction).  - Ventricular septum: Septal motion showed paradox. These changes    are consistent with right ventricular pacing.  - Aortic valve: There was mild regurgitation.  - Mitral valve: There is a calcified mobile density in the LV    cavity most consistent with calcified redundant chordae tendinae.    This was present on echo in 2014. There was mild regurgitation.  - Pulmonic valve: There was trivial regurgitation.      Neuro/Psych negative neurological ROS  negative psych ROS   GI/Hepatic Neg liver ROS, PUD,,,  Endo/Other  diabetes, Well Controlled, Type 2, Insulin Dependent  FS 102   Renal/GU negative Renal ROS  negative genitourinary   Musculoskeletal  (+) Arthritis , Osteoarthritis,  R ankle fx   Abdominal   Peds negative pediatric ROS (+)  Hematology  (+)  Blood dyscrasia, anemia Hb 11.6, plt 136   Anesthesia Other Findings   Reproductive/Obstetrics negative OB ROS                             Anesthesia Physical Anesthesia Plan  ASA: 3  Anesthesia Plan: General and Regional   Post-op Pain Management: Regional block* and Tylenol PO (pre-op)*   Induction: Intravenous  PONV Risk Score and Plan: 3 and Ondansetron, Dexamethasone and Treatment may vary due to age or medical condition  Airway Management Planned: LMA  Additional Equipment: None  Intra-op Plan:   Post-operative Plan: Extubation in OR  Informed Consent: I have reviewed the patients History and Physical, chart, labs and discussed the procedure including the risks, benefits and alternatives for the proposed anesthesia with the patient or authorized representative who has indicated his/her understanding and acceptance.     Dental advisory given  Plan Discussed with: CRNA  Anesthesia Plan Comments:        Anesthesia Quick Evaluation

## 2023-09-09 NOTE — Progress Notes (Signed)
Progress Note    Grace Robinson   AVW:[id number]  DOB: 01/13/1926  DOA: 09/06/2023     3 PCP: Mast, Man X, NP  Initial CC: fall at home  Hospital Course: Grace Robinson is a 89 yo female with PMH CHF s/p ICD, DMII, HLD, HoH, HTN, Meniere's disease, UC who presented after a mechanical fall at home.  She described feeling her legs as becoming weak and buckling causing her to fall onto her right side.  She was unable to ambulate after falling and crawled along the floor to obtain her phone.  She was then brought to the hospital for further evaluation. Right leg x-ray showed open distal fibular fracture.  She was evaluated by orthopedic surgery and taken to the OR for I&D of the open fracture and repair of lacerations.  Tentative plan for ORIF planned for 09/09/2023.  Interval History:  No events overnight. Son present this morning. Plan is for surgery today with orthopedic surgery.   Assessment and Plan: * Open fracture of right distal fibula - s/p mechanical fall at home; no description of syncope at this time.  - s/p I&D and laceration repair with ortho on 11/6 - tentative plan for ORIF on 11/8 - continue NWB to RLE - continue pain control  Hypokalemia - Replete as needed  Type 2 diabetes mellitus with renal complication (HCC) - A1c was 7.5% in April 2024  - Check CBGs, continue long-acting insulin, add SSI for now  NICM (nonischemic cardiomyopathy) (HCC) - EF recovered - Appears compensated  - Hold Lasix and ARB initially given increase in BUN & creatinine, monitor weight and I/Os, continue beta-blocker as tolerated  HTN (hypertension) - Continue metoprolol, hold losartan initially given increase in creatinine    Old records reviewed in assessment of this patient  Antimicrobials:   DVT prophylaxis:  heparin injection 5,000 Units Start: 09/07/23 1400 SCDs Start: 09/06/23 2352   Code Status:   Code Status: Limited: Do not attempt resuscitation (DNR) -DNR-LIMITED -Do Not  Intubate/DNI   Mobility Assessment (Last 72 Hours)     Mobility Assessment     Row Name 09/09/23 0900 09/08/23 1939 09/08/23 0900 09/07/23 2015 09/07/23 0800   Does patient have an order for bedrest or is patient medically unstable No - Continue assessment No - Continue assessment No - Continue assessment No - Continue assessment No - Continue assessment   What is the highest level of mobility based on the progressive mobility assessment? Level 2 (Chairfast) - Balance while sitting on edge of bed and cannot stand Level 1 (Bedfast) - Unable to balance while sitting on edge of bed Level 3 (Stands with assist) - Balance while standing  and cannot march in place Level 1 (Bedfast) - Unable to balance while sitting on edge of bed Level 1 (Bedfast) - Unable to balance while sitting on edge of bed   Is the above level different from baseline mobility prior to current illness? Yes - Recommend PT order Yes - Recommend PT order Yes - Recommend PT order Yes - Recommend PT order Yes - Recommend PT order    Row Name 09/07/23 0300           Does patient have an order for bedrest or is patient medically unstable No - Continue assessment       What is the highest level of mobility based on the progressive mobility assessment? Level 5 (Walks with assist in room/hall) - Balance while stepping forward/back and can walk in room with  assist - Complete                Barriers to discharge: none Disposition Plan:  Pending PT eval after surgery, likely needs SNF Status is: Inpt  Objective: Blood pressure (!) 177/70, pulse 81, temperature 98 F (36.7 C), temperature source Axillary, resp. rate 20, weight 63.3 kg, SpO2 95%.  Examination:  Physical Exam Constitutional:      Appearance: Normal appearance.     Comments: Hard of hearing noted but able to still interact easily  HENT:     Head: Normocephalic and atraumatic.     Mouth/Throat:     Mouth: Mucous membranes are moist.  Eyes:     Extraocular  Movements: Extraocular movements intact.  Cardiovascular:     Rate and Rhythm: Normal rate and regular rhythm.  Pulmonary:     Effort: Pulmonary effort is normal. No respiratory distress.     Breath sounds: Normal breath sounds. No wheezing.  Abdominal:     General: Bowel sounds are normal. There is no distension.     Palpations: Abdomen is soft.     Tenderness: There is no abdominal tenderness.  Musculoskeletal:     Cervical back: Normal range of motion and neck supple.     Comments: Right leg in surgical dressings   Skin:    General: Skin is warm and dry.  Neurological:     General: No focal deficit present.     Mental Status: She is alert.  Psychiatric:        Mood and Affect: Mood normal.      Consultants:  Orthopedic surgery  Procedures:  09/07/23:  PROCEDURE:  Irrigation and debridement of bone, periosteum, subcutaneous tissue, skin associated with open right ankle fracture dislocation Complex repair right ankle traumatic lacerations 15 cm Application of short leg splint  Data Reviewed: Results for orders placed or performed during the hospital encounter of 09/06/23 (from the past 24 hour(s))  Glucose, capillary     Status: Abnormal   Collection Time: 09/08/23  4:10 PM  Result Value Ref Range   Glucose-Capillary 149 (H) 70 - 99 mg/dL  MRSA Next Gen by PCR, Nasal     Status: None   Collection Time: 09/09/23  3:13 AM   Specimen: Nasal Mucosa; Nasal Swab  Result Value Ref Range   MRSA by PCR Next Gen NOT DETECTED NOT DETECTED  Basic metabolic panel     Status: Abnormal   Collection Time: 09/09/23  5:59 AM  Result Value Ref Range   Sodium 141 135 - 145 mmol/L   Potassium 4.3 3.5 - 5.1 mmol/L   Chloride 108 98 - 111 mmol/L   CO2 25 22 - 32 mmol/L   Glucose, Bld 143 (H) 70 - 99 mg/dL   BUN 23 8 - 23 mg/dL   Creatinine, Ser 1.61 0.44 - 1.00 mg/dL   Calcium 8.7 (L) 8.9 - 10.3 mg/dL   GFR, Estimated 56 (L) >60 mL/min   Anion gap 8 5 - 15  CBC     Status: Abnormal    Collection Time: 09/09/23  5:59 AM  Result Value Ref Range   WBC 11.9 (H) 4.0 - 10.5 K/uL   RBC 3.58 (L) 3.87 - 5.11 MIL/uL   Hemoglobin 11.6 (L) 12.0 - 15.0 g/dL   HCT 09.6 (L) 04.5 - 40.9 %   MCV 97.5 80.0 - 100.0 fL   MCH 32.4 26.0 - 34.0 pg   MCHC 33.2 30.0 - 36.0 g/dL   RDW  14.1 11.5 - 15.5 %   Platelets 136 (L) 150 - 400 K/uL   nRBC 0.0 0.0 - 0.2 %  Magnesium     Status: None   Collection Time: 09/09/23  5:59 AM  Result Value Ref Range   Magnesium 2.1 1.7 - 2.4 mg/dL  Glucose, capillary     Status: Abnormal   Collection Time: 09/09/23  7:56 AM  Result Value Ref Range   Glucose-Capillary 144 (H) 70 - 99 mg/dL  Glucose, capillary     Status: Abnormal   Collection Time: 09/09/23 11:17 AM  Result Value Ref Range   Glucose-Capillary 102 (H) 70 - 99 mg/dL  Glucose, capillary     Status: None   Collection Time: 09/09/23  1:10 PM  Result Value Ref Range   Glucose-Capillary 88 70 - 99 mg/dL    I have reviewed pertinent nursing notes, vitals, labs, and images as necessary. I have ordered labwork to follow up on as indicated.  I have reviewed the last notes from staff over past 24 hours. I have discussed patient's care plan and test results with nursing staff, CM/SW, and other staff as appropriate.    LOS: 3 days   Lewie Chamber, MD [facility] 09/09/2023, 1:25 PM

## 2023-09-09 NOTE — Transfer of Care (Signed)
Immediate Anesthesia Transfer of Care Note  Patient: Grace Robinson  Procedure(s) Performed: OPEN REDUCTION INTERNAL FIXATION (ORIF) ANKLE FRACTURE (Right: Ankle)  Patient Location: PACU  Anesthesia Type:General  Level of Consciousness: drowsy, patient cooperative, and responds to stimulation  Airway & Oxygen Therapy: Patient Spontanous Breathing  Post-op Assessment: Report given to RN and Post -op Vital signs reviewed and stable  Post vital signs: Reviewed and stable  Last Vitals:  Vitals Value Taken Time  BP 142/52 09/09/23 1505  Temp    Pulse 67 09/09/23 1508  Resp 21 09/09/23 1508  SpO2 95 % 09/09/23 1508  Vitals shown include unfiled device data.  Last Pain:  Vitals:   09/09/23 1301  TempSrc: Axillary  PainSc:          Complications: No notable events documented.

## 2023-09-09 NOTE — Anesthesia Procedure Notes (Signed)
Anesthesia Regional Block: Popliteal block   Pre-Anesthetic Checklist: , timeout performed,  Correct Patient, Correct Site, Correct Laterality,  Correct Procedure, Correct Position, site marked,  Risks and benefits discussed,  Surgical consent,  Pre-op evaluation,  At surgeon's request and post-op pain management  Laterality: Right  Prep: Maximum Sterile Barrier Precautions used, chloraprep       Needles:  Injection technique: Single-shot  Needle Type: Echogenic Stimulator Needle     Needle Length: 9cm  Needle Gauge: 22     Additional Needles:   Procedures:,,,, ultrasound used (permanent image in chart),,    Narrative:  Start time: 09/09/2023 1:10 PM End time: 09/09/2023 1:13 PM Injection made incrementally with aspirations every 5 mL.  Performed by: Personally  Anesthesiologist: [name], DO  Additional Notes: Monitors applied. No increased pain on injection. No increased resistance to injection. Injection made in 5cc increments. Good needle visualization. Patient tolerated procedure well.

## 2023-09-10 DIAGNOSIS — K589 Irritable bowel syndrome without diarrhea: Secondary | ICD-10-CM

## 2023-09-10 DIAGNOSIS — K580 Irritable bowel syndrome with diarrhea: Secondary | ICD-10-CM

## 2023-09-10 DIAGNOSIS — S82831C Other fracture of upper and lower end of right fibula, initial encounter for open fracture type IIIA, IIIB, or IIIC: Secondary | ICD-10-CM | POA: Diagnosis not present

## 2023-09-10 LAB — BASIC METABOLIC PANEL
Anion gap: 11 (ref 5–15)
BUN: 22 mg/dL (ref 8–23)
CO2: 25 mmol/L (ref 22–32)
Calcium: 8.3 mg/dL — ABNORMAL LOW (ref 8.9–10.3)
Chloride: 102 mmol/L (ref 98–111)
Creatinine, Ser: 0.92 mg/dL (ref 0.44–1.00)
GFR, Estimated: 57 mL/min — ABNORMAL LOW (ref >60)
Glucose, Bld: 181 mg/dL — ABNORMAL HIGH (ref 70–99)
Potassium: 3.8 mmol/L (ref 3.5–5.1)
Sodium: 138 mmol/L (ref 135–145)

## 2023-09-10 LAB — CBC
HCT: 31.9 % — ABNORMAL LOW (ref 36.0–46.0)
Hemoglobin: 10.5 g/dL — ABNORMAL LOW (ref 12.0–15.0)
MCH: 31.5 pg (ref 26.0–34.0)
MCHC: 32.9 g/dL (ref 30.0–36.0)
MCV: 95.8 fL (ref 80.0–100.0)
Platelets: 119 10*3/uL — ABNORMAL LOW (ref 150–400)
RBC: 3.33 MIL/uL — ABNORMAL LOW (ref 3.87–5.11)
RDW: 14.1 % (ref 11.5–15.5)
WBC: 8.1 10*3/uL (ref 4.0–10.5)
nRBC: 0 % (ref 0.0–0.2)

## 2023-09-10 LAB — GLUCOSE, CAPILLARY
Glucose-Capillary: 157 mg/dL — ABNORMAL HIGH (ref 70–99)
Glucose-Capillary: 318 mg/dL — ABNORMAL HIGH (ref 70–99)
Glucose-Capillary: 270 mg/dL — ABNORMAL HIGH (ref 70–99)
Glucose-Capillary: 144 mg/dL — ABNORMAL HIGH (ref 70–99)

## 2023-09-10 LAB — MAGNESIUM: Magnesium: 2.2 mg/dL (ref 1.7–2.4)

## 2023-09-10 MED ORDER — LOPERAMIDE HCL 2 MG PO CAPS: 2.0000 mg | ORAL_CAPSULE | ORAL | Status: DC | PRN

## 2023-09-10 NOTE — Plan of Care (Signed)

## 2023-09-10 NOTE — Evaluation (Signed)
Occupational Therapy Evaluation Patient Details Name: Grace Robinson MRN: [id number] DOB: 14-Jan-1926 Today's Date: 09/10/2023   History of Present Illness Pt is a 89 y.o. female who presented 09/06/23 s/p fall in which pt sustained a type 3a open fx dislocation of her R ankle. S/p I&D 11/6, ORIF 11/8. PMH: CHF s/p ICD, DMII, HLD, HoH, HTN, Meniere's disease, UC, breast cancer, DM   Clinical Impression   Pt lives at [facility] in [place]. She ambulates with a Rollator and is independent in ADL with the exception of bathing - she has an aide 2 days a week to assist with that. She cooks her own breakfast  but eats in the dining room for her other meals. Pt today is min A for transfers with RW (cues for safe hand placement) max A for LB ADL, set up in seated position for UB ADL and very motivated. Pt will benefit from skilled OT in the acute setting as well as afterwards at the <3 hour daily rehab level to maximize safety and independence in ADL and functional transfers.       If plan is discharge home, recommend the following: A little help with walking and/or transfers;A lot of help with bathing/dressing/bathroom;Assistance with cooking/housework;Assist for transportation;Help with stairs or ramp for entrance    Functional Status Assessment  Patient has had a recent decline in their functional status and demonstrates the ability to make significant improvements in function in a reasonable and predictable amount of time.  Equipment Recommendations  Other (comment) (defer to next venue of care)    Recommendations for Other Services PT consult     Precautions / Restrictions Precautions Precautions: Fall Precaution Comments: wound vac R lower leg Restrictions Weight Bearing Restrictions: Yes RLE Weight Bearing: Weight bearing as tolerated Other Position/Activity Restrictions: in CAM boot      Mobility Bed Mobility Overal bed mobility: Needs Assistance Bed Mobility: Supine to Sit     Supine to  sit: Min assist, HOB elevated     General bed mobility comments: Pt initiated bringing her legs off L EOB and ascending her trunk well to sit up L EOB but needed minA to scoot hips to EOB.    Transfers Overall transfer level: Needs assistance Equipment used: Rolling walker (2 wheels) Transfers: Sit to/from Stand Sit to Stand: Min assist, +2 safety/equipment           General transfer comment: Pt needed verbal cues to scoot to edge of bed and place one hand on bed to push up to stand rather than pulling up on RW with bil UEs. MinA to power up to stand and gain balance, +2 for safety      Balance Overall balance assessment: Needs assistance Sitting-balance support: No upper extremity supported, Feet supported Sitting balance-Leahy Scale: Good     Standing balance support: Bilateral upper extremity supported, During functional activity, Reliant on assistive device for balance Standing balance-Leahy Scale: Poor Standing balance comment: Reliant on RW and up to minA                           ADL either performed or assessed with clinical judgement   ADL Overall ADL's : Needs assistance/impaired Eating/Feeding: Set up;Sitting   Grooming: Set up;Sitting   Upper Body Bathing: Moderate assistance;Sitting Upper Body Bathing Details (indicate cue type and reason): for back Lower Body Bathing: Moderate assistance Lower Body Bathing Details (indicate cue type and reason): knees down Upper Body Dressing :  Set up   Lower Body Dressing: Maximal assistance Lower Body Dressing Details (indicate cue type and reason): to don cam boot and additional shoe Toilet Transfer: Minimal assistance;+2 for safety/equipment;Ambulation;Rolling walker (2 wheels)   Toileting- Clothing Manipulation and Hygiene: Maximal assistance;Sit to/from stand       Functional mobility during ADLs: Minimal assistance;Cueing for safety;Cueing for sequencing;Rolling walker (2 wheels) General ADL  Comments: decreased access to LB for ADL, decreased balance in standing, increased assists for transfers     Vision Baseline Vision/History: 1 Wears glasses Ability to See in Adequate Light: 0 Adequate Patient Visual Report: No change from baseline Vision Assessment?: No apparent visual deficits     Perception Perception: Within Functional Limits       Praxis Praxis: WFL       Pertinent Vitals/Pain Pain Assessment Pain Assessment: No/denies pain     Extremity/Trunk Assessment Upper Extremity Assessment Upper Extremity Assessment: Generalized weakness (age appropriate)   Lower Extremity Assessment Lower Extremity Assessment: RLE deficits/detail RLE Deficits / Details: decreased sensation to light touch at foot, able to detect deep touch though; decreased ROM and strength distally s/p ORIF of R ankle fx RLE Sensation: decreased light touch   Cervical / Trunk Assessment Cervical / Trunk Assessment: Normal   Communication Communication Communication: No apparent difficulties   Cognition Arousal: Alert Behavior During Therapy: WFL for tasks assessed/performed Overall Cognitive Status: Within Functional Limits for tasks assessed                                       General Comments  Granddaughter present throughout session, Pt is retired [employer] officer. VSS on RA    Exercises     Shoulder Instructions      Home Living Family/patient expects to be discharged to:: Assisted living (ILF - [facility])                             Home Equipment: Shower seat - built in;Grab bars - tub/shower;Grab bars - toilet;Rollator (4 wheels)   Additional Comments: walk-in shower, standard toilet height      Prior Functioning/Environment Prior Level of Function : Needs assist;History of Falls (last six months)       Physical Assist : ADLs (physical)   ADLs (physical): Bathing Mobility Comments: Uses rollator mod I; recent falls ADLs  Comments: Aide comes 2x/week to assist with bathing, goes to dining area for dinner and intermittently for lunch        OT Problem List: Decreased activity tolerance;Impaired balance (sitting and/or standing);Decreased knowledge of use of DME or AE      OT Treatment/Interventions: Self-care/ADL training;DME and/or AE instruction;Therapeutic activities;Patient/family education;Balance training    OT Goals(Current goals can be found in the care plan section) Acute Rehab OT Goals Patient Stated Goal: get back to independent OT Goal Formulation: With patient/family Time For Goal Achievement: 09/24/23 Potential to Achieve Goals: Good ADL Goals Pt Will Perform Grooming: with modified independence;sitting Pt Will Perform Upper Body Dressing: with modified independence;sitting Pt Will Perform Lower Body Dressing: with contact guard assist;sit to/from stand Pt Will Transfer to Toilet: with supervision;ambulating Pt Will Perform Toileting - Clothing Manipulation and hygiene: with supervision;sitting/lateral leans  OT Frequency: Min 1X/week    Co-evaluation PT/OT/SLP Co-Evaluation/Treatment: Yes Reason for Co-Treatment: For patient/therapist safety;To address functional/ADL transfers PT goals addressed during session: Mobility/safety with mobility;Balance;Proper  use of DME OT goals addressed during session: ADL's and self-care;Strengthening/ROM;Proper use of Adaptive equipment and DME      AM-PAC OT "6 Clicks" Daily Activity     Outcome Measure Help from another person eating meals?: None Help from another person taking care of personal grooming?: A Little Help from another person toileting, which includes using toliet, bedpan, or urinal?: A Lot Help from another person bathing (including washing, rinsing, drying)?: A Lot Help from another person to put on and taking off regular upper body clothing?: A Little Help from another person to put on and taking off regular lower body clothing?: A  Lot 6 Click Score: 16   End of Session Equipment Utilized During Treatment: Gait belt;Rolling walker (2 wheels);Other (comment) (CAM boot) Nurse Communication: Mobility status;Precautions;Weight bearing status  Activity Tolerance: Patient tolerated treatment well Patient left: in chair;with call bell/phone within reach;with chair alarm set;with family/visitor present  OT Visit Diagnosis: Unsteadiness on feet (R26.81);Other abnormalities of gait and mobility (R26.89);History of falling (Z91.81)                Time: 2536-6440 OT Time Calculation (min): 38 min Charges:  OT General Charges $OT Visit: 1 Visit OT Evaluation $OT Eval Moderate Complexity: 1 Mod  Nyoka Cowden OTR/L Acute Rehabilitation Services Office: [phone]  Emelda Fear 09/10/2023, 12:18 PM

## 2023-09-10 NOTE — Evaluation (Signed)
Physical Therapy Evaluation Patient Details Name: MELE OKLAND MRN: [id number] DOB: July 03, 1926 Today's Date: 09/10/2023  History of Present Illness  Pt is a 89 y.o. female who presented 09/06/23 s/p fall in which pt sustained a type 3a open fx dislocation of her R ankle. S/p I&D 11/6, ORIF 11/8. PMH: CHF s/p ICD, DMII, HLD, HoH, HTN, Meniere's disease, UC, breast cancer, DM   Clinical Impression  Pt presents with condition above and deficits mentioned below, see PT Problem List. PTA, she was mod I utilizing a rollator for functional mobility and residing at [facility]. Currently, pt is displaying decreased sensation, strength, and ROM distally in her R lower extremity s/p ORIF of R ankle fx. She is also displaying deficits in activity tolerance and balance. At this time, pt is requiring minA for all functional mobility, including ambulating bedroom distances with a RW. She is at high risk for subsequent falls and could benefit from short-term inpatient rehab, < 3 hours/day. Will continue to follow acutely.       If plan is discharge home, recommend the following: A little help with walking and/or transfers;A little help with bathing/dressing/bathroom;Assistance with cooking/housework;Assist for transportation;Help with stairs or ramp for entrance   Can travel by private vehicle   Yes    Equipment Recommendations None recommended by PT  Recommendations for Other Services       Functional Status Assessment Patient has had a recent decline in their functional status and demonstrates the ability to make significant improvements in function in a reasonable and predictable amount of time.     Precautions / Restrictions Precautions Precautions: Fall Precaution Comments: wound vac R lower leg Restrictions Weight Bearing Restrictions: Yes RLE Weight Bearing: Weight bearing as tolerated Other Position/Activity Restrictions: in CAM boot      Mobility  Bed Mobility Overal bed  mobility: Needs Assistance Bed Mobility: Supine to Sit     Supine to sit: Min assist, HOB elevated     General bed mobility comments: Pt initiated bringing her legs off L EOB and ascending her trunk well to sit up L EOB but needed minA to scoot hips to EOB.    Transfers Overall transfer level: Needs assistance Equipment used: Rolling walker (2 wheels) Transfers: Sit to/from Stand Sit to Stand: Min assist, +2 safety/equipment           General transfer comment: Pt needed verbal cues to scoot to edge of bed and place one hand on bed to push up to stand rather than pulling up on RW with bil UEs. MinA to power up to stand and gain balance, +2 for safety    Ambulation/Gait Ambulation/Gait assistance: Min assist, +2 safety/equipment Gait Distance (Feet): 15 Feet Assistive device: Rolling walker (2 wheels) Gait Pattern/deviations: Step-through pattern, Decreased step length - right, Decreased step length - left, Decreased stride length Gait velocity: reduced Gait velocity interpretation: <1.31 ft/sec, indicative of household ambulator   General Gait Details: Pt ambulates slowly with RW and minA for balance, +2 for safety and chair follow. Pt needs cues to keep hands on grips of RW and to keep RW a little more distally from her as she was often running into the front bar of the RW. No LOB noted.  Stairs            Wheelchair Mobility     Tilt Bed    Modified Rankin (Stroke Patients Only)       Balance Overall balance assessment: Needs assistance Sitting-balance support: No  upper extremity supported, Feet supported Sitting balance-Leahy Scale: Good     Standing balance support: Bilateral upper extremity supported, During functional activity, Reliant on assistive device for balance Standing balance-Leahy Scale: Poor Standing balance comment: Reliant on RW and up to minA                             Pertinent Vitals/Pain Pain Assessment Pain Assessment:  No/denies pain    Home Living Family/patient expects to be discharged to:: Assisted living (ILF - [facility])                 Home Equipment: Information systems manager - built in;Grab bars - tub/shower;Grab bars - toilet;Rollator (4 wheels) Additional Comments: walk-in shower, standard toilet height    Prior Function Prior Level of Function : Needs assist;History of Falls (last six months)       Physical Assist : ADLs (physical)   ADLs (physical): Bathing Mobility Comments: Uses rollator mod I; recent falls ADLs Comments: Aide comes 2x/week to assist with bathing, goes to dining area for dinner and intermittently for lunch     Extremity/Trunk Assessment   Upper Extremity Assessment Upper Extremity Assessment: Defer to OT evaluation    Lower Extremity Assessment Lower Extremity Assessment: RLE deficits/detail RLE Deficits / Details: decreased sensation to light touch at foot, able to detect deep touch though; decreased ROM and strength distally s/p ORIF of R ankle fx RLE Sensation: decreased light touch    Cervical / Trunk Assessment Cervical / Trunk Assessment: Normal  Communication   Communication Communication: No apparent difficulties  Cognition Arousal: Alert Behavior During Therapy: WFL for tasks assessed/performed Overall Cognitive Status: Within Functional Limits for tasks assessed                                          General Comments General comments (skin integrity, edema, etc.): VSS on RA    Exercises     Assessment/Plan    PT Assessment Patient needs continued PT services  PT Problem List Decreased strength;Decreased activity tolerance;Decreased balance;Decreased range of motion;Decreased mobility;Impaired sensation       PT Treatment Interventions DME instruction;Gait training;Functional mobility training;Therapeutic exercise;Therapeutic activities;Balance training;Neuromuscular re-education;Patient/family education    PT Goals  (Current goals can be found in the Care Plan section)  Acute Rehab PT Goals Patient Stated Goal: to improve and return to being independent PT Goal Formulation: With patient/family Time For Goal Achievement: 09/24/23 Potential to Achieve Goals: Good    Frequency Min 1X/week     Co-evaluation PT/OT/SLP Co-Evaluation/Treatment: Yes Reason for Co-Treatment: For patient/therapist safety;To address functional/ADL transfers PT goals addressed during session: Mobility/safety with mobility;Balance;Proper use of DME         AM-PAC PT "6 Clicks" Mobility  Outcome Measure Help needed turning from your back to your side while in a flat bed without using bedrails?: A Little Help needed moving from lying on your back to sitting on the side of a flat bed without using bedrails?: A Little Help needed moving to and from a bed to a chair (including a wheelchair)?: A Little Help needed standing up from a chair using your arms (e.g., wheelchair or bedside chair)?: A Little Help needed to walk in hospital room?: Total (< 20 ft) Help needed climbing 3-5 steps with a railing? : Total 6 Click Score: 14  End of Session Equipment Utilized During Treatment: Gait belt;Other (comment) (CAM boot) Activity Tolerance: Patient tolerated treatment well Patient left: in chair;with call bell/phone within reach;with chair alarm set;with family/visitor present Nurse Communication: Mobility status PT Visit Diagnosis: Unsteadiness on feet (R26.81);Other abnormalities of gait and mobility (R26.89);Muscle weakness (generalized) (M62.81);Difficulty in walking, not elsewhere classified (R26.2)    Time: 1478-2956 PT Time Calculation (min) (ACUTE ONLY): 38 min   Charges:   PT Evaluation $PT Eval Moderate Complexity: 1 Mod PT Treatments $Therapeutic Activity: 8-22 mins PT General Charges $$ ACUTE PT VISIT: 1 Visit         Virgil Benedict, PT, DPT Acute Rehabilitation Services  Office: [phone]   Bettina Gavia 09/10/2023, 11:31 AM

## 2023-09-10 NOTE — Assessment & Plan Note (Signed)
-   takes dicyclomine chronically, 10 mg TID and imodium PRN too - family reports ~ 5 falls this year; some with mixed etiology but given now advanced age and having been on this for ~8 years she says and sometimes can't tell any benefit, that it's reasonable to hold further use for now given that risks might start outweighing benefit - if diarrhea does get noticeably worse without then maybe a retrial of it to see if still beneficial, but her ongoing fall history is concerning enough to keep holding for now

## 2023-09-10 NOTE — Progress Notes (Signed)
Progress Note    MARJEAN FRANCESCHINI   ZOX:[id number]  DOB: 08/30/1926  DOA: 09/06/2023     4 PCP: Mast, Man X, NP  Initial CC: fall at home  Hospital Course: Ms. Buffo is a 89 yo female with PMH CHF s/p ICD, DMII, HLD, HoH, HTN, Meniere's disease, UC who presented after a mechanical fall at home.  She described feeling her legs as becoming weak and buckling causing her to fall onto her right side.  She was unable to ambulate after falling and crawled along the floor to obtain her phone.  She was then brought to the hospital for further evaluation. Right leg x-ray showed open distal fibular fracture.  She was evaluated by orthopedic surgery and taken to the OR for I&D of the open fracture and repair of lacerations.  She then underwent ORIF on 09/09/2023.  Interval History:  Tolerated surgery well yesterday.  Family visiting bedside this afternoon.  Seems to be doing okay.  Tentative plan is for her returning to skilled nursing side at [facility] at discharge.  Assessment and Plan: * Open fracture of right distal fibula - s/p mechanical fall at home; no description of syncope at this time.  - s/p I&D and laceration repair with ortho on 11/6 - s/p ORIF on 11/8 - WBAT to RLE with CAM boot - wound vac to remain x 1 week per ortho rec's -Outpatient follow-up with orthopedic surgery, Dr. Lajoyce Corners 1 week after surgery -Evaluated by PT/OT.  SNF recommended for rehab  HTN (hypertension) - Continue metoprolol, hold losartan initially given increase in creatinine -BP also noted to be soft this morning, continue holding losartan  Type 2 diabetes mellitus with renal complication (HCC) - A1c was 7.5% in April 2024  - Check CBGs, continue long-acting insulin, add SSI for now -Decadron given during surgery on 09/09/2023.  Some elevation expected and should trend back to appropriate ranges - Continue current insulin dosing for now  IBS (irritable bowel syndrome) - takes dicyclomine chronically, 10 mg  TID and imodium PRN too - family reports ~ 5 falls this year; some with mixed etiology but given now advanced age and having been on this for ~8 years she says and sometimes can't tell any benefit, that it's reasonable to hold further use for now given that risks might start outweighing benefit - if diarrhea does get noticeably worse without then maybe a retrial of it to see if still beneficial, but her ongoing fall history is concerning enough to keep holding for now  Hypokalemia - Replete as needed  NICM (nonischemic cardiomyopathy) (HCC) - EF recovered - Appears compensated  - Hold Lasix and ARB initially given increase in BUN & creatinine, monitor weight and I/Os, continue beta-blocker as tolerated   Old records reviewed in assessment of this patient  Antimicrobials:   DVT prophylaxis:  heparin injection 5,000 Units Start: 09/07/23 1400 SCDs Start: 09/06/23 2352   Code Status:   Code Status: Limited: Do not attempt resuscitation (DNR) -DNR-LIMITED -Do Not Intubate/DNI   Mobility Assessment (Last 72 Hours)     Mobility Assessment     Row Name 09/10/23 1143 09/10/23 1000 09/10/23 0810 09/09/23 2014 09/09/23 0900   Does patient have an order for bedrest or is patient medically unstable -- -- No - Continue assessment No - Continue assessment No - Continue assessment   What is the highest level of mobility based on the progressive mobility assessment? Level 4 (Walks with assist in room) - Balance while marching  in place and cannot step forward and back - Complete Level 4 (Walks with assist in room) - Balance while marching in place and cannot step forward and back - Complete Level 2 (Chairfast) - Balance while sitting on edge of bed and cannot stand Level 2 (Chairfast) - Balance while sitting on edge of bed and cannot stand Level 2 (Chairfast) - Balance while sitting on edge of bed and cannot stand   Is the above level different from baseline mobility prior to current illness? -- --  Yes - Recommend PT order Yes - Recommend PT order Yes - Recommend PT order    Row Name 09/08/23 1939 09/08/23 0900 09/07/23 2015       Does patient have an order for bedrest or is patient medically unstable No - Continue assessment No - Continue assessment No - Continue assessment     What is the highest level of mobility based on the progressive mobility assessment? Level 1 (Bedfast) - Unable to balance while sitting on edge of bed Level 3 (Stands with assist) - Balance while standing  and cannot march in place Level 1 (Bedfast) - Unable to balance while sitting on edge of bed     Is the above level different from baseline mobility prior to current illness? Yes - Recommend PT order Yes - Recommend PT order Yes - Recommend PT order              Barriers to discharge: none Disposition Plan:  SNF Status is: Inpt  Objective: Blood pressure (!) 101/49, pulse 70, temperature 98.6 F (37 C), temperature source Oral, resp. rate 16, weight 63.3 kg, SpO2 94%.  Examination:  Physical Exam Constitutional:      Appearance: Normal appearance.     Comments: Hard of hearing noted but able to still interact easily  HENT:     Head: Normocephalic and atraumatic.     Mouth/Throat:     Mouth: Mucous membranes are moist.  Eyes:     Extraocular Movements: Extraocular movements intact.  Cardiovascular:     Rate and Rhythm: Normal rate and regular rhythm.  Pulmonary:     Effort: Pulmonary effort is normal. No respiratory distress.     Breath sounds: Normal breath sounds. No wheezing.  Abdominal:     General: Bowel sounds are normal. There is no distension.     Palpations: Abdomen is soft.     Tenderness: There is no abdominal tenderness.  Musculoskeletal:     Cervical back: Normal range of motion and neck supple.     Comments: Right leg in cam boot with WV in place; canister had no blood noted or drainage  Skin:    General: Skin is warm and dry.  Neurological:     General: No focal deficit  present.     Mental Status: She is alert.  Psychiatric:        Mood and Affect: Mood normal.      Consultants:  Orthopedic surgery  Procedures:   09/07/23:  PROCEDURE:  Irrigation and debridement of bone, periosteum, subcutaneous tissue, skin associated with open right ankle fracture dislocation Complex repair right ankle traumatic lacerations 15 cm Application of short leg splint  09/09/23:  PROCEDURE:   OPEN REDUCTION INTERNAL FIXATION (ORIF) fibula FRACTURE. Internal fixation syndesmosis. Application Kerecis micro graft 38 cm. Application axial form wound VAC dressing.  Data Reviewed: Results for orders placed or performed during the hospital encounter of 09/06/23 (from the past 24 hour(s))  Glucose, capillary  Status: Abnormal   Collection Time: 09/09/23  3:59 PM  Result Value Ref Range   Glucose-Capillary 123 (H) 70 - 99 mg/dL  Glucose, capillary     Status: Abnormal   Collection Time: 09/09/23  9:27 PM  Result Value Ref Range   Glucose-Capillary 234 (H) 70 - 99 mg/dL  Basic metabolic panel     Status: Abnormal   Collection Time: 09/10/23  5:30 AM  Result Value Ref Range   Sodium 138 135 - 145 mmol/L   Potassium 3.8 3.5 - 5.1 mmol/L   Chloride 102 98 - 111 mmol/L   CO2 25 22 - 32 mmol/L   Glucose, Bld 181 (H) 70 - 99 mg/dL   BUN 22 8 - 23 mg/dL   Creatinine, Ser 2.44 0.44 - 1.00 mg/dL   Calcium 8.3 (L) 8.9 - 10.3 mg/dL   GFR, Estimated 57 (L) >60 mL/min   Anion gap 11 5 - 15  CBC     Status: Abnormal   Collection Time: 09/10/23  5:30 AM  Result Value Ref Range   WBC 8.1 4.0 - 10.5 K/uL   RBC 3.33 (L) 3.87 - 5.11 MIL/uL   Hemoglobin 10.5 (L) 12.0 - 15.0 g/dL   HCT 01.0 (L) 27.2 - 53.6 %   MCV 95.8 80.0 - 100.0 fL   MCH 31.5 26.0 - 34.0 pg   MCHC 32.9 30.0 - 36.0 g/dL   RDW 64.4 03.4 - 74.2 %   Platelets 119 (L) 150 - 400 K/uL   nRBC 0.0 0.0 - 0.2 %  Magnesium     Status: None   Collection Time: 09/10/23  5:30 AM  Result Value Ref Range   Magnesium  2.2 1.7 - 2.4 mg/dL  Glucose, capillary     Status: Abnormal   Collection Time: 09/10/23  8:00 AM  Result Value Ref Range   Glucose-Capillary 157 (H) 70 - 99 mg/dL  Glucose, capillary     Status: Abnormal   Collection Time: 09/10/23 11:50 AM  Result Value Ref Range   Glucose-Capillary 318 (H) 70 - 99 mg/dL    I have reviewed pertinent nursing notes, vitals, labs, and images as necessary. I have ordered labwork to follow up on as indicated.  I have reviewed the last notes from staff over past 24 hours. I have discussed patient's care plan and test results with nursing staff, CM/SW, and other staff as appropriate.    LOS: 4 days   Lewie Chamber, MD [facility] 09/10/2023, 2:34 PM

## 2023-09-11 DIAGNOSIS — S82831C Other fracture of upper and lower end of right fibula, initial encounter for open fracture type IIIA, IIIB, or IIIC: Secondary | ICD-10-CM | POA: Diagnosis not present

## 2023-09-11 DIAGNOSIS — I10 Essential (primary) hypertension: Secondary | ICD-10-CM | POA: Diagnosis not present

## 2023-09-11 LAB — GLUCOSE, CAPILLARY
Glucose-Capillary: 174 mg/dL — ABNORMAL HIGH (ref 70–99)
Glucose-Capillary: 234 mg/dL — ABNORMAL HIGH (ref 70–99)
Glucose-Capillary: 227 mg/dL — ABNORMAL HIGH (ref 70–99)
Glucose-Capillary: 264 mg/dL — ABNORMAL HIGH (ref 70–99)

## 2023-09-11 MED ORDER — IRBESARTAN 150 MG PO TABS
150.0000 mg | ORAL_TABLET | Freq: Once | ORAL | Status: AC
  Administered 2023-09-11: 150 mg via ORAL
  Filled 2023-09-11: qty 1

## 2023-09-11 MED ORDER — IRBESARTAN 150 MG PO TABS
150.0000 mg | ORAL_TABLET | Freq: Every day | ORAL | Status: DC
  Administered 2023-09-11: 150 mg via ORAL
  Filled 2023-09-11: qty 1

## 2023-09-11 NOTE — Progress Notes (Signed)
Patient denies pain, SOB, headache, dizziness, feeling sad or feeling anxious. Just reports "feeling bad" and "feeling like she has no energy." Patient has not had any pain meds today and received the irbesartan at 1435. Patient's BP and slightly elevated at 168/50 and patient appears to have more of a flat affect. Notified Dr. Frederick Peers of this with instructions to recheck BP in 1 hr & then give the PRN hydralazine if BP still elevated.  BP recheck was elevated at 181/60 & PRN hydralazine given. Dr. Latanya Maudlin notified.    09/11/23 1610  Psychosocial  Psychosocial (WDL) X  Patient Behaviors Cooperative;Flat affect;Other (Comment) (pt reports "feeling bad")  Needs Expressed Emotional;Physical  Emotional support given Given to patient

## 2023-09-11 NOTE — Progress Notes (Signed)
Progress Note    Grace Robinson   XBJ:[id number]  DOB: 25-Feb-1926  DOA: 09/06/2023     5 PCP: Mast, Man X, NP  Initial CC: fall at home  Hospital Course: Grace Robinson is a 89 yo female with PMH CHF s/p ICD, DMII, HLD, HoH, HTN, Meniere's disease, UC who presented after a mechanical fall at home.  She described feeling her legs as becoming weak and buckling causing her to fall onto her right side.  She was unable to ambulate after falling and crawled along the floor to obtain her phone.  She was then brought to the hospital for further evaluation. Right leg x-ray showed open distal fibular fracture.  She was evaluated by orthopedic surgery and taken to the OR for I&D of the open fracture and repair of lacerations.  She then underwent ORIF on 09/09/2023.  Interval History:  No events overnight. Sitting in recliner this morning doing well.   Assessment and Plan: * Open fracture of right distal fibula - s/p mechanical fall at home; no description of syncope at this time.  - s/p I&D and laceration repair with ortho on 11/6 - s/p ORIF on 11/8 - WBAT to RLE with CAM boot - wound vac to remain x 1 week per ortho rec's -Outpatient follow-up with orthopedic surgery, Dr. Lajoyce Corners 1 week after surgery -Evaluated by PT/OT.  SNF recommended for rehab  HTN (hypertension) - Continue metoprolol -Resuming ARB as blood pressure now uptrending.  Still using lower dosing  Type 2 diabetes mellitus with renal complication (HCC) - A1c was 7.5% in April 2024  - Check CBGs, continue long-acting insulin, add SSI for now -Decadron given during surgery on 09/09/2023.  Some elevation expected and should trend back to appropriate ranges - Continue current insulin dosing for now  IBS (irritable bowel syndrome) - takes dicyclomine chronically, 10 mg TID and imodium PRN too - family reports ~ 5 falls this year; some with mixed etiology but given now advanced age and having been on this for ~8 years she says and  sometimes can't tell any benefit, that it's reasonable to hold further use for now given that risks might start outweighing benefit - if diarrhea does get noticeably worse without then maybe a retrial of it to see if still beneficial, but her ongoing fall history is concerning enough to keep holding for now  Hypokalemia - Replete as needed  NICM (nonischemic cardiomyopathy) (HCC) - EF recovered - Appears compensated    Old records reviewed in assessment of this patient  Antimicrobials:   DVT prophylaxis:  heparin injection 5,000 Units Start: 09/07/23 1400 SCDs Start: 09/06/23 2352   Code Status:   Code Status: Limited: Do not attempt resuscitation (DNR) -DNR-LIMITED -Do Not Intubate/DNI   Mobility Assessment (Last 72 Hours)     Mobility Assessment     Row Name 09/10/23 2000 09/10/23 1143 09/10/23 1000 09/10/23 0810 09/09/23 2014   Does patient have an order for bedrest or is patient medically unstable No - Continue assessment -- -- No - Continue assessment No - Continue assessment   What is the highest level of mobility based on the progressive mobility assessment? Level 3 (Stands with assist) - Balance while standing  and cannot march in place Level 4 (Walks with assist in room) - Balance while marching in place and cannot step forward and back - Complete Level 4 (Walks with assist in room) - Balance while marching in place and cannot step forward and back - Complete Level  2 (Chairfast) - Balance while sitting on edge of bed and cannot stand Level 2 (Chairfast) - Balance while sitting on edge of bed and cannot stand   Is the above level different from baseline mobility prior to current illness? Yes - Recommend PT order -- -- Yes - Recommend PT order Yes - Recommend PT order    Row Name 09/09/23 0900 09/08/23 1939         Does patient have an order for bedrest or is patient medically unstable No - Continue assessment No - Continue assessment      What is the highest level of  mobility based on the progressive mobility assessment? Level 2 (Chairfast) - Balance while sitting on edge of bed and cannot stand Level 1 (Bedfast) - Unable to balance while sitting on edge of bed      Is the above level different from baseline mobility prior to current illness? Yes - Recommend PT order Yes - Recommend PT order               Barriers to discharge: none Disposition Plan:  SNF Status is: Inpt  Objective: Blood pressure (!) 167/55, pulse 67, temperature 98 F (36.7 C), temperature source Oral, resp. rate 16, weight 63.3 kg, SpO2 100%.  Examination:  Physical Exam Constitutional:      Appearance: Normal appearance.     Comments: Hard of hearing noted but able to still interact easily  HENT:     Head: Normocephalic and atraumatic.     Mouth/Throat:     Mouth: Mucous membranes are moist.  Eyes:     Extraocular Movements: Extraocular movements intact.  Cardiovascular:     Rate and Rhythm: Normal rate and regular rhythm.  Pulmonary:     Effort: Pulmonary effort is normal. No respiratory distress.     Breath sounds: Normal breath sounds. No wheezing.  Abdominal:     General: Bowel sounds are normal. There is no distension.     Palpations: Abdomen is soft.     Tenderness: There is no abdominal tenderness.  Musculoskeletal:     Cervical back: Normal range of motion and neck supple.     Comments: Right leg in cam boot with WV in place; canister had no blood noted or drainage  Skin:    General: Skin is warm and dry.  Neurological:     General: No focal deficit present.     Mental Status: She is alert.  Psychiatric:        Mood and Affect: Mood normal.      Consultants:  Orthopedic surgery  Procedures:   09/07/23:  PROCEDURE:  Irrigation and debridement of bone, periosteum, subcutaneous tissue, skin associated with open right ankle fracture dislocation Complex repair right ankle traumatic lacerations 15 cm Application of short leg splint  09/09/23:   PROCEDURE:   OPEN REDUCTION INTERNAL FIXATION (ORIF) fibula FRACTURE. Internal fixation syndesmosis. Application Kerecis micro graft 38 cm. Application axial form wound VAC dressing.  Data Reviewed: Results for orders placed or performed during the hospital encounter of 09/06/23 (from the past 24 hour(s))  Glucose, capillary     Status: Abnormal   Collection Time: 09/10/23  3:47 PM  Result Value Ref Range   Glucose-Capillary 270 (H) 70 - 99 mg/dL  Glucose, capillary     Status: Abnormal   Collection Time: 09/10/23  8:55 PM  Result Value Ref Range   Glucose-Capillary 144 (H) 70 - 99 mg/dL  Glucose, capillary     Status: Abnormal  Collection Time: 09/11/23  8:34 AM  Result Value Ref Range   Glucose-Capillary 174 (H) 70 - 99 mg/dL    I have reviewed pertinent nursing notes, vitals, labs, and images as necessary. I have ordered labwork to follow up on as indicated.  I have reviewed the last notes from staff over past 24 hours. I have discussed patient's care plan and test results with nursing staff, CM/SW, and other staff as appropriate.    LOS: 5 days   Lewie Chamber, MD [facility] 09/11/2023, 12:47 PM

## 2023-09-12 ENCOUNTER — Encounter (HOSPITAL_COMMUNITY): Payer: Self-pay | Admitting: Orthopedic Surgery

## 2023-09-12 DIAGNOSIS — S82831C Other fracture of upper and lower end of right fibula, initial encounter for open fracture type IIIA, IIIB, or IIIC: Secondary | ICD-10-CM | POA: Diagnosis not present

## 2023-09-12 DIAGNOSIS — I10 Essential (primary) hypertension: Secondary | ICD-10-CM | POA: Diagnosis not present

## 2023-09-12 LAB — GLUCOSE, CAPILLARY
Glucose-Capillary: 159 mg/dL — ABNORMAL HIGH (ref 70–99)
Glucose-Capillary: 219 mg/dL — ABNORMAL HIGH (ref 70–99)
Glucose-Capillary: 239 mg/dL — ABNORMAL HIGH (ref 70–99)
Glucose-Capillary: 227 mg/dL — ABNORMAL HIGH (ref 70–99)

## 2023-09-12 MED ORDER — IRBESARTAN 300 MG PO TABS
300.0000 mg | ORAL_TABLET | Freq: Every day | ORAL | Status: DC
  Administered 2023-09-12 – 2023-09-14 (×3): 300 mg via ORAL
  Filled 2023-09-12 (×3): qty 1

## 2023-09-12 NOTE — Progress Notes (Addendum)
Patient ID: [name], female   DOB: 11/16/1925, 89 y.o.   MRN: [id number] Patient is a 89 year old woman who is postoperative day 3 open right distal fibula fracture.  The wound VAC is functioning well there is no drainage.  Plan for discharge to skilled nursing.  Weight bearing as tolerated on the right lower extremity.  Will discontinue the wound VAC at time of discharge.

## 2023-09-12 NOTE — Progress Notes (Signed)
Progress Note    Grace Robinson   ZOX:[id number]  DOB: April 29, 1926  DOA: 09/06/2023     6 PCP: Mast, Man X, NP  Initial CC: fall at home  Hospital Course: Grace Robinson is a 89 yo female with PMH CHF s/p ICD, DMII, HLD, HoH, HTN, Meniere's disease, UC who presented after a mechanical fall at home.  She described feeling her legs as becoming weak and buckling causing her to fall onto her right side.  She was unable to ambulate after falling and crawled along the floor to obtain her phone.  She was then brought to the hospital for further evaluation. Right leg x-ray showed open distal fibular fracture.  She was evaluated by orthopedic surgery and taken to the OR for I&D of the open fracture and repair of lacerations.  She then underwent ORIF on 09/09/2023.  Interval History:  No events overnight. Affect appears more flat this morning but she says she feels okay.   Assessment and Plan: * Open fracture of right distal fibula - s/p mechanical fall at home; no description of syncope at this time.  - s/p I&D and laceration repair with ortho on 11/6 - s/p ORIF on 11/8 - WBAT to RLE with CAM boot - wound vac to remain x 1 week per ortho rec's -Outpatient follow-up with orthopedic surgery, Dr. Lajoyce Corners 1 week after surgery -Evaluated by PT/OT.  SNF recommended for rehab  HTN (hypertension) - Continue metoprolol -BP now rebounded.  Resumed ARB  Type 2 diabetes mellitus with renal complication (HCC) - A1c was 7.5% in April 2024  - Check CBGs, continue long-acting insulin, add SSI for now -Decadron given during surgery on 09/09/2023.  Some elevation expected and should trend back to appropriate ranges - Continue current insulin dosing for now  IBS (irritable bowel syndrome) - takes dicyclomine chronically, 10 mg TID and imodium PRN too - family reports ~ 5 falls this year; some with mixed etiology but given now advanced age and having been on this for ~8 years she says and sometimes can't tell any  benefit, that it's reasonable to hold further use for now given that risks might start outweighing benefit - if diarrhea does get noticeably worse without then maybe a retrial of it to see if still beneficial, but her ongoing fall history is concerning enough to keep holding for now  Hypokalemia - Replete as needed  NICM (nonischemic cardiomyopathy) (HCC) - EF recovered - Appears compensated    Old records reviewed in assessment of this patient  Antimicrobials:   DVT prophylaxis:  heparin injection 5,000 Units Start: 09/07/23 1400 SCDs Start: 09/06/23 2352   Code Status:   Code Status: Limited: Do not attempt resuscitation (DNR) -DNR-LIMITED -Do Not Intubate/DNI   Mobility Assessment (Last 72 Hours)     Mobility Assessment     Row Name 09/12/23 0830 09/11/23 2000 09/11/23 0835 09/10/23 2000 09/10/23 1143   Does patient have an order for bedrest or is patient medically unstable No - Continue assessment No - Continue assessment No - Continue assessment No - Continue assessment --   What is the highest level of mobility based on the progressive mobility assessment? Level 3 (Stands with assist) - Balance while standing  and cannot march in place Level 3 (Stands with assist) - Balance while standing  and cannot march in place Level 3 (Stands with assist) - Balance while standing  and cannot march in place Level 3 (Stands with assist) - Balance while standing  and  cannot march in place Level 4 (Walks with assist in room) - Balance while marching in place and cannot step forward and back - Complete   Is the above level different from baseline mobility prior to current illness? Yes - Recommend PT order Yes - Recommend PT order Yes - Recommend PT order Yes - Recommend PT order --    Row Name 09/10/23 1000 09/10/23 0810 09/09/23 2014       Does patient have an order for bedrest or is patient medically unstable -- No - Continue assessment No - Continue assessment     What is the highest  level of mobility based on the progressive mobility assessment? Level 4 (Walks with assist in room) - Balance while marching in place and cannot step forward and back - Complete Level 2 (Chairfast) - Balance while sitting on edge of bed and cannot stand Level 2 (Chairfast) - Balance while sitting on edge of bed and cannot stand     Is the above level different from baseline mobility prior to current illness? -- Yes - Recommend PT order Yes - Recommend PT order              Barriers to discharge: none Disposition Plan:  SNF Status is: Inpt  Objective: Blood pressure 132/70, pulse 67, temperature 97.8 F (36.6 C), temperature source Oral, resp. rate 14, weight 63.4 kg, SpO2 100%.  Examination:  Physical Exam Constitutional:      Appearance: Normal appearance.     Comments: Hard of hearing noted but able to still interact easily  HENT:     Head: Normocephalic and atraumatic.     Mouth/Throat:     Mouth: Mucous membranes are moist.  Eyes:     Extraocular Movements: Extraocular movements intact.  Cardiovascular:     Rate and Rhythm: Normal rate and regular rhythm.  Pulmonary:     Effort: Pulmonary effort is normal. No respiratory distress.     Breath sounds: Normal breath sounds. No wheezing.  Abdominal:     General: Bowel sounds are normal. There is no distension.     Palpations: Abdomen is soft.     Tenderness: There is no abdominal tenderness.  Musculoskeletal:     Cervical back: Normal range of motion and neck supple.     Comments: Right leg in cam boot with WV in place; canister had no blood noted or drainage  Skin:    General: Skin is warm and dry.  Neurological:     General: No focal deficit present.     Mental Status: She is alert.  Psychiatric:        Mood and Affect: Mood normal.      Consultants:  Orthopedic surgery  Procedures:   09/07/23:  PROCEDURE:  Irrigation and debridement of bone, periosteum, subcutaneous tissue, skin associated with open right  ankle fracture dislocation Complex repair right ankle traumatic lacerations 15 cm Application of short leg splint  09/09/23:  PROCEDURE:   OPEN REDUCTION INTERNAL FIXATION (ORIF) fibula FRACTURE. Internal fixation syndesmosis. Application Kerecis micro graft 38 cm. Application axial form wound VAC dressing.  Data Reviewed: Results for orders placed or performed during the hospital encounter of 09/06/23 (from the past 24 hour(s))  Glucose, capillary     Status: Abnormal   Collection Time: 09/11/23 12:49 PM  Result Value Ref Range   Glucose-Capillary 234 (H) 70 - 99 mg/dL  Glucose, capillary     Status: Abnormal   Collection Time: 09/11/23  4:15 PM  Result  Value Ref Range   Glucose-Capillary 227 (H) 70 - 99 mg/dL  Glucose, capillary     Status: Abnormal   Collection Time: 09/11/23  9:12 PM  Result Value Ref Range   Glucose-Capillary 264 (H) 70 - 99 mg/dL  Glucose, capillary     Status: Abnormal   Collection Time: 09/12/23  7:31 AM  Result Value Ref Range   Glucose-Capillary 159 (H) 70 - 99 mg/dL    I have reviewed pertinent nursing notes, vitals, labs, and images as necessary. I have ordered labwork to follow up on as indicated.  I have reviewed the last notes from staff over past 24 hours. I have discussed patient's care plan and test results with nursing staff, CM/SW, and other staff as appropriate.    LOS: 6 days   Lewie Chamber, MD [facility] 09/12/2023, 11:30 AM

## 2023-09-12 NOTE — [place] FL2 (Signed)
[place] MEDICAID FL2 LEVEL OF CARE FORM     IDENTIFICATION  Patient Name: Grace Robinson Birthdate: 10-19-1926 Sex: female Admission Date (Current Location): 09/06/2023  [facility] and [place] Number:  Producer, television/film/video and Address:  [facility], [address], [place], [place] [address]      Provider Number: [id number]  Attending Physician Name and Address:  Lewie Chamber, MD  Relative Name and Phone Number:  Jomarie Longs (son) [phone]    Current Level of Care: Hospital Recommended Level of Care: Skilled Nursing Facility Prior Approval Number:    Date Approved/Denied:   PASRR Number: [id number]  Discharge Plan: SNF    Current Diagnoses: Patient Active Problem List   Diagnosis Date Noted   IBS (irritable bowel syndrome) 09/10/2023   Open trimalleolar fracture of ankle, right, type III, initial encounter 09/09/2023   Hypokalemia 09/08/2023   Dislocation of ankle, right, open, initial encounter 09/07/2023   Open fracture of right distal fibula 09/06/2023   COVID-19 virus infection 06/09/2023   Gait abnormality 05/19/2023   Osteoarthritis 05/03/2023   Radiation dermatitis 05/03/2023   Osteopenia after menopause 03/04/2023   Closed pelvic fracture (HCC) 02/23/2023   Lower back pain 02/23/2023   Hydronephrosis 02/23/2023   Sigmoid diverticulitis 02/23/2023   Aortic atherosclerosis (HCC) 02/23/2023   Hyperlipidemia associated with type 2 diabetes mellitus (HCC) 02/23/2023   Vitamin B12 deficiency 02/23/2023   Vitamin D deficiency 02/23/2023   Type 2 diabetes mellitus with renal complication (HCC) 02/23/2023   S/P lumpectomy, right breast 01/05/2023   Ductal carcinoma in situ (DCIS) of right breast 12/06/2022   NICM (nonischemic cardiomyopathy) (HCC) 07/30/2021   Degenerative tear of posterior horn of medial meniscus of left knee 04/14/2016   Tear of lateral meniscus of left knee 04/14/2016   Synovial plica of left knee 04/14/2016    Mechanical complication of implantable cardioverter-defibrillator (ICD) 01/17/2014   Fall 07/10/2013   Paroxysmal ventricular tachycardia (HCC) 12/13/2012   Biventricular implantable cardioverter-defibrillator -Medtronic 12/08/2011   Atrial tachycardia (HCC) 12/08/2011   HTN (hypertension) 05/19/2011   CARDIOMYOPATHY, PRIMARY, DILATED 12/01/2010   SYSTOLIC HEART FAILURE, CHRONIC 12/01/2010    Orientation RESPIRATION BLADDER Height & Weight     Self, Time, Situation, Place  Normal Incontinent, External catheter (External Urinary catheter) Weight: 139 lb 12.4 oz (63.4 kg) Height:     BEHAVIORAL SYMPTOMS/MOOD NEUROLOGICAL BOWEL NUTRITION STATUS      Continent Diet (Please see discharge summary)  AMBULATORY STATUS COMMUNICATION OF NEEDS Skin   Limited Assist Verbally Other (Comment) (Wound/Incision LDAs,Incision closed,Leg,R,compression wrap,dressing in place,Incision closed,Leg,R,compression wrap,dressing in place,Neg.Pressure wound therapy,pretibail,distal,R,lateral,dressing in place)                       Personal Care Assistance Level of Assistance  Bathing, Feeding, Dressing Bathing Assistance: Limited assistance Feeding assistance: Independent Dressing Assistance: Limited assistance     Functional Limitations Info  Sight, Hearing, Speech Sight Info: Impaired Financial trader) Hearing Info: Impaired (Wears Hearling Aid) Speech Info: Adequate    SPECIAL CARE FACTORS FREQUENCY  PT (By licensed PT), OT (By licensed OT)     PT Frequency: 5x min weekly OT Frequency: 5x min weekly            Contractures Contractures Info: Not present    Additional Factors Info  Code Status, Allergies, Insulin Sliding Scale Code Status Info: DNR Allergies Info: Coreg (carvedilol),Firvanq (vancomycin),Lactose Intolerance (gi),Lanoxin (digoxin),Sudafed (pseudoephedrine)   Insulin Sliding Scale Info: insulin aspart (novoLOG) injection 0-5  Units daily at bedtime ,  insulin aspart (novoLOG)  injection 0-9 Units 3 times daily with meals,insulin glargine-yfgn [facility]) injection 25 Units daily       Current Medications (09/12/2023):  This is the current hospital active medication list Current Facility-Administered Medications  Medication Dose Route Frequency Provider Last Rate Last Admin   acetaminophen (TYLENOL) tablet 325-650 mg  325-650 mg Oral Q6H PRN [name], MD   650 mg at 09/12/23 0510   atorvastatin (LIPITOR) tablet 40 mg  40 mg Oral Daily [name], MD   40 mg at 09/12/23 0837   docusate sodium (COLACE) capsule 100 mg  100 mg Oral BID [name], MD   100 mg at 09/12/23 0837   haloperidol lactate (HALDOL) injection 2 mg  2 mg Intravenous Q6H PRN [name], MD   2 mg at 09/08/23 0456   heparin injection 5,000 Units  5,000 Units Subcutaneous Q8H [name], MD   5,000 Units at 09/12/23 1509   hydrALAZINE (APRESOLINE) injection 10 mg  10 mg Intravenous Q4H PRN [name], MD   10 mg at 09/12/23 0419   HYDROmorphone (DILAUDID) injection 0.5-1 mg  0.5-1 mg Intravenous Q4H PRN [name], MD       insulin aspart (novoLOG) injection 0-5 Units  0-5 Units Subcutaneous QHS [name], MD   3 Units at 09/11/23 2143   insulin aspart (novoLOG) injection 0-9 Units  0-9 Units Subcutaneous TID WC [name], MD   3 Units at 09/12/23 1222   insulin glargine-yfgn (SEMGLEE) injection 25 Units  25 Units Subcutaneous Daily [name], MD   25 Units at 09/12/23 0836   irbesartan (AVAPRO) tablet 300 mg  300 mg Oral Daily Lewie Chamber, MD   300 mg at 09/12/23 1610   loperamide (IMODIUM) capsule 2 mg  2 mg Oral Q4H PRN Lewie Chamber, MD       methocarbamol (ROBAXIN) tablet 500 mg  500 mg Oral Q6H PRN [name], MD       metoCLOPramide (REGLAN) tablet 5-10 mg  5-10 mg Oral Q8H PRN [name], MD       Or   metoCLOPramide (REGLAN) injection 5-10 mg  5-10 mg Intravenous Q8H PRN [name], MD       metoprolol succinate (TOPROL-XL) 24 hr  tablet 100 mg  100 mg Oral BID [name], MD   100 mg at 09/12/23 0837   ondansetron (ZOFRAN) tablet 4 mg  4 mg Oral Q6H PRN [name], MD       Or   ondansetron [facility]) injection 4 mg  4 mg Intravenous Q6H PRN [name], MD       oxyCODONE (Oxy IR/ROXICODONE) immediate release tablet 10-15 mg  10-15 mg Oral Q4H PRN [name], MD       oxyCODONE (Oxy IR/ROXICODONE) immediate release tablet 5-10 mg  5-10 mg Oral Q4H PRN [name], MD       polyethylene glycol (MIRALAX / GLYCOLAX) packet 17 g  17 g Oral Daily PRN [name], MD       sodium chloride flush (NS) 0.9 % injection 3 mL  3 mL Intravenous Q12H [name], MD   3 mL at 09/11/23 1256     Discharge Medications: Please see discharge summary for a list of discharge medications.  Relevant Imaging Results:  Relevant Lab Results:   Additional Information [id number]  Delilah Shan, LCSWA

## 2023-09-12 NOTE — Progress Notes (Signed)
Physical Therapy Treatment Patient Details Name: Grace Robinson MRN: [id number] DOB: 27-Jun-1926 Today's Date: 09/12/2023   History of Present Illness Pt is a 89 y.o. female who presented 09/06/23 s/p fall in which pt sustained a type 3a open fx dislocation of her R ankle. S/p I&D 11/6, ORIF 11/8. PMH: CHF s/p ICD, DMII, HLD, HoH, HTN, Meniere's disease, UC, breast cancer, DM    PT Comments  Progressing well toward goals given deconditioned state.  Emphasis on safety, sit to stand technique, progression of gait stability/stamina with the RW and transitions back into bed.     If plan is discharge home, recommend the following: A little help with walking and/or transfers;A little help with bathing/dressing/bathroom;Assistance with cooking/housework;Assist for transportation;Help with stairs or ramp for entrance   Can travel by private vehicle     Yes  Equipment Recommendations  None recommended by PT    Recommendations for Other Services       Precautions / Restrictions Precautions Precautions: Fall Precaution Comments: wound vac R lower leg Restrictions RLE Weight Bearing: Weight bearing as tolerated Other Position/Activity Restrictions: in CAM boot     Mobility  Bed Mobility Overal bed mobility: Needs Assistance Bed Mobility: Sit to Supine       Sit to supine: Min assist (at LE's)        Transfers Overall transfer level: Needs assistance Equipment used: Rolling walker (2 wheels) Transfers: Sit to/from Stand Sit to Stand: Min assist           General transfer comment: cues for hand placement ascent/descent.  assist for help forward more than boost.    Ambulation/Gait Ambulation/Gait assistance: Contact guard assist, Min assist Gait Distance (Feet): 110 Feet Assistive device: Rolling walker (2 wheels) Gait Pattern/deviations: Step-through pattern, Decreased step length - right, Decreased step length - left, Decreased stride length Gait velocity: reduced Gait  velocity interpretation: <1.8 ft/sec, indicate of risk for recurrent falls   General Gait Details: mildly unsteady at times.  needed assist initially and then assist she started to fatigue and L triceps to hurt.  A good part of the distance was at [facility] with the RW.   Stairs             Wheelchair Mobility     Tilt Bed    Modified Rankin (Stroke Patients Only)       Balance     Sitting balance-Leahy Scale: Good       Standing balance-Leahy Scale: Poor Standing balance comment: reliant on either the RW or external support                            Cognition Arousal: Alert Behavior During Therapy: WFL for tasks assessed/performed Overall Cognitive Status: Within Functional Limits for tasks assessed                                          Exercises General Exercises - Lower Extremity Long Arc Quad: AROM, Both, 10 reps, Seated    General Comments General comments (skin integrity, edema, etc.): VSS on RA      Pertinent Vitals/Pain Pain Assessment Pain Assessment: Faces Faces Pain Scale: Hurts little more Pain Location: L UE at tricep area. Pain Descriptors / Indicators: Sore Pain Intervention(s): Monitored during session    Home Living  Prior Function            PT Goals (current goals can now be found in the care plan section) Acute Rehab PT Goals Patient Stated Goal: to improve and return to being independent PT Goal Formulation: With patient/family Time For Goal Achievement: 09/24/23 Potential to Achieve Goals: Good Progress towards PT goals: Progressing toward goals    Frequency    Min 1X/week      PT Plan      Co-evaluation              AM-PAC PT "6 Clicks" Mobility   Outcome Measure  Help needed turning from your back to your side while in a flat bed without using bedrails?: A Little Help needed moving from lying on your back to sitting on the side of a flat bed  without using bedrails?: A Little Help needed moving to and from a bed to a chair (including a wheelchair)?: A Little Help needed standing up from a chair using your arms (e.g., wheelchair or bedside chair)?: A Little Help needed to walk in hospital room?: A Little Help needed climbing 3-5 steps with a railing? : A Lot 6 Click Score: 17    End of Session   Activity Tolerance: Patient tolerated treatment well Patient left: in bed;with call bell/phone within reach Nurse Communication: Mobility status PT Visit Diagnosis: Unsteadiness on feet (R26.81);Other abnormalities of gait and mobility (R26.89);Pain Pain - Right/Left:  (left) Pain - part of body: Arm     Time: 4098-1191 PT Time Calculation (min) (ACUTE ONLY): 35 min  Charges:    $Gait Training: 8-22 mins $Therapeutic Exercise: 8-22 mins PT General Charges $$ ACUTE PT VISIT: 1 Visit                     09/12/2023  Jacinto Halim., PT Acute Rehabilitation Services [phone]  (office)   Grace Robinson 09/12/2023, 5:04 PM

## 2023-09-12 NOTE — Anesthesia Postprocedure Evaluation (Signed)
Anesthesia Post Note  Patient: Grace Robinson  Procedure(s) Performed: OPEN REDUCTION INTERNAL FIXATION (ORIF) ANKLE FRACTURE (Right: Ankle)     Patient location during evaluation: PACU Anesthesia Type: Regional and General Level of consciousness: awake and alert, oriented and patient cooperative Pain management: pain level controlled Vital Signs Assessment: post-procedure vital signs reviewed and stable Respiratory status: spontaneous breathing, nonlabored ventilation and respiratory function stable Cardiovascular status: blood pressure returned to baseline and stable Postop Assessment: no apparent nausea or vomiting Anesthetic complications: no   No notable events documented.  Last Vitals:  Vitals:   09/12/23 0512 09/12/23 0726  BP: (!) 144/45 (!) 133/49  Pulse: 67 64  Resp:    Temp:  36.4 C  SpO2: 96% 98%    Last Pain:  Vitals:   09/12/23 0726  TempSrc: Oral  PainSc:                  [name]

## 2023-09-12 NOTE — TOC Initial Note (Signed)
Transition of Care [facility]) - Initial/Assessment Note    Patient Details  Name: Grace Robinson MRN: [id number] Date of Birth: February 16, 1926  Transition of Care [facility]) CM/SW Contact:    Delilah Shan, LCSWA Phone Number: 09/12/2023, 4:34 PM  Clinical Narrative:                  CSW received consult for possible SNF placement at time of discharge. CSW spoke with patient regarding PT recommendation of SNF placement at time of discharge. Patient reports she is from [facility]. Patient expressed understanding of PT recommendation and is agreeable to SNF placement at time of discharge. Patient reports preference for [facility]. CSW discussed insurance authorization process.  No further questions reported at this time. CSW updated patients son Jomarie Longs. CSW spoke with [place] with [facility] who confirmed SNF bed for patient. CSW started insurance authorization for patient ref# [id number]. CSW submitted requested clinicals to patients insurance for review.CSW to continue to follow and assist with discharge planning needs.  Expected Discharge Plan: Skilled Nursing Facility Barriers to Discharge: Continued Medical Work up   Patient Goals and CMS Choice Patient states their goals for this hospitalization and ongoing recovery are:: SNF CMS Medicare.gov Compare Post Acute Care list provided to:: Patient Choice offered to / list presented to : Patient      Expected Discharge Plan and Services In-house Referral: Clinical Social Work     Living arrangements for the past 2 months: Apartment                                      Prior Living Arrangements/Services Living arrangements for the past 2 months: Apartment   Patient language and need for interpreter reviewed:: Yes Do you feel safe going back to the place where you live?: No   SNF  Need for Family Participation in Patient Care: Yes (Comment) Care giver support system in place?: Yes  (comment)   Criminal Activity/Legal Involvement Pertinent to Current Situation/Hospitalization: No - Comment as needed  Activities of Daily Living      Permission Sought/Granted Permission sought to share information with : Case Manager, Family Supports, Magazine features editor Permission granted to share information with : Yes, Verbal Permission Granted  Share Information with NAME: Jomarie Longs  Permission granted to share info w AGENCY: SNF  Permission granted to share info w Relationship: son  Permission granted to share info w Contact Information: Jomarie Longs [phone]  Emotional Assessment Appearance:: Appears stated age Attitude/Demeanor/Rapport: Gracious Affect (typically observed): Calm Orientation: : Oriented to Self, Oriented to Place, Oriented to  Time, Oriented to Situation Alcohol / Substance Use: Not Applicable Psych Involvement: No (comment)  Admission diagnosis:  Open fracture of right distal fibula [S82.831B] Injury of right ankle, initial encounter [W09.811B] Patient Active Problem List   Diagnosis Date Noted   IBS (irritable bowel syndrome) 09/10/2023   Open trimalleolar fracture of ankle, right, type III, initial encounter 09/09/2023   Hypokalemia 09/08/2023   Dislocation of ankle, right, open, initial encounter 09/07/2023   Open fracture of right distal fibula 09/06/2023   COVID-19 virus infection 06/09/2023   Gait abnormality 05/19/2023   Osteoarthritis 05/03/2023   Radiation dermatitis 05/03/2023   Osteopenia after menopause 03/04/2023   Closed pelvic fracture (HCC) 02/23/2023   Lower back pain 02/23/2023   Hydronephrosis 02/23/2023   Sigmoid diverticulitis 02/23/2023   Aortic atherosclerosis (HCC) 02/23/2023  Hyperlipidemia associated with type 2 diabetes mellitus (HCC) 02/23/2023   Vitamin B12 deficiency 02/23/2023   Vitamin D deficiency 02/23/2023   Type 2 diabetes mellitus with renal complication (HCC) 02/23/2023   S/P lumpectomy, right  breast 01/05/2023   Ductal carcinoma in situ (DCIS) of right breast 12/06/2022   NICM (nonischemic cardiomyopathy) (HCC) 07/30/2021   Degenerative tear of posterior horn of medial meniscus of left knee 04/14/2016   Tear of lateral meniscus of left knee 04/14/2016   Synovial plica of left knee 04/14/2016   Mechanical complication of implantable cardioverter-defibrillator (ICD) 01/17/2014   Fall 07/10/2013   Paroxysmal ventricular tachycardia (HCC) 12/13/2012   Biventricular implantable cardioverter-defibrillator -Medtronic 12/08/2011   Atrial tachycardia (HCC) 12/08/2011   HTN (hypertension) 05/19/2011   CARDIOMYOPATHY, PRIMARY, DILATED 12/01/2010   SYSTOLIC HEART FAILURE, CHRONIC 12/01/2010   PCP:  Mast, Man X, NP Pharmacy:   [facility] [phone] [place], [place] - [address] AT [facility] [address] [facility] [address] [place] [address] Phone: [phone] Fax: [phone]     Social Determinants of Health (SDOH) Social History: SDOH Screenings   Food Insecurity: No Food Insecurity (12/08/2022)  Housing: Low Risk  (12/08/2022)  Transportation Needs: No Transportation Needs (12/08/2022)  Utilities: Not At Risk (12/08/2022)  Depression (PHQ2-9): Low Risk  (07/29/2023)  Financial Resource Strain: Low Risk  (12/08/2022)  Tobacco Use: Medium Risk (09/06/2023)   SDOH Interventions:     Readmission Risk Interventions     No data to display

## 2023-09-13 DIAGNOSIS — S82831C Other fracture of upper and lower end of right fibula, initial encounter for open fracture type IIIA, IIIB, or IIIC: Secondary | ICD-10-CM | POA: Diagnosis not present

## 2023-09-13 DIAGNOSIS — I10 Essential (primary) hypertension: Secondary | ICD-10-CM | POA: Diagnosis not present

## 2023-09-13 LAB — GLUCOSE, CAPILLARY
Glucose-Capillary: 129 mg/dL — ABNORMAL HIGH (ref 70–99)
Glucose-Capillary: 312 mg/dL — ABNORMAL HIGH (ref 70–99)
Glucose-Capillary: 171 mg/dL — ABNORMAL HIGH (ref 70–99)
Glucose-Capillary: 268 mg/dL — ABNORMAL HIGH (ref 70–99)

## 2023-09-13 NOTE — Progress Notes (Signed)
Called and gave report to the nurse who will be receiving the patient in 5N room 32. She was transferred by nurse tech in a wheelchair with patient's personal belongings. Patient did not want me to update her family on her changing rooms.

## 2023-09-13 NOTE — Inpatient Diabetes Management (Signed)
Inpatient Diabetes Program Recommendations  AACE/ADA: New Consensus Statement on Inpatient Glycemic Control (2015)  Target Ranges:  Prepandial:   less than 140 mg/dL      Peak postprandial:   less than 180 mg/dL (1-2 hours)      Critically ill patients:  140 - 180 mg/dL   Lab Results  Component Value Date   GLUCAP 312 (H) 09/13/2023   HGBA1C 7.5 02/25/2023    Review of Glycemic Control  Latest Reference Range & Units 09/12/23 15:13 09/12/23 20:43 09/13/23 08:22 09/13/23 11:56  Glucose-Capillary 70 - 99 mg/dL 409 (H) 811 (H) 914 (H) 312 (H)  (H): Data is abnormally high Diabetes history: Type 2 DM Outpatient Diabetes medications: Toujeo 48 units QD Current orders for Inpatient glycemic control: Novolog 0-9 units TID & HS, Semglee 25 units QD  Inpatient Diabetes Program Recommendations:    Could consider adding Novolog 2-3 units TID (assuming patient consuming >50% of meals).  Thanks, Lujean Rave, MSN, RNC-OB Diabetes Coordinator [phone] (8a-5p)

## 2023-09-13 NOTE — Progress Notes (Signed)
Physical Therapy Treatment Patient Details Name: Grace Robinson MRN: [id number] DOB: [birth year] Today's Date: 09/13/2023   History of Present Illness Pt is a 89 y.o. female who presented 09/06/23 s/p fall in which pt sustained a type 3a open fx dislocation of her R ankle. S/p I&D 11/6, ORIF 11/8. PMH: CHF s/p ICD, DMII, HLD, HoH, HTN, Meniere's disease, UC, breast cancer, DM    PT Comments  Pt progressing well toward goals, but more unsteady today, likely a combination of mildly increased pain, CAM boot and chronic balance issues.  Emphasis on safety in general with sit to stands and progression of gait with the RW with work on improving gait pattern and stability.     If plan is discharge home, recommend the following: A little help with walking and/or transfers;A little help with bathing/dressing/bathroom;Assistance with cooking/housework;Assist for transportation;Help with stairs or ramp for entrance   Can travel by private vehicle     Yes  Equipment Recommendations  None recommended by PT    Recommendations for Other Services       Precautions / Restrictions Precautions Precautions: Fall Precaution Comments: wound vac R lower leg Restrictions Weight Bearing Restrictions: Yes RLE Weight Bearing: Weight bearing as tolerated Other Position/Activity Restrictions: in CAM boot     Mobility  Bed Mobility Overal bed mobility: Needs Assistance Bed Mobility: Sit to Supine       Sit to supine: Min assist (at LE's)   General bed mobility comments: Pt initiated bringing her legs off L EOB and ascending her trunk well to sit up L EOB but needed minA to scoot hips to EOB.    Transfers Overall transfer level: Needs assistance Equipment used: Rolling walker (2 wheels) Transfers: Sit to/from Stand Sit to Stand: Mod assist           General transfer comment: cues for hand placement ascent/descent.  assist for help forward more than boost.  Posterior bias more pronounced today     Ambulation/Gait Ambulation/Gait assistance: Min assist Gait Distance (Feet): 120 Feet Assistive device: Rolling walker (2 wheels) Gait Pattern/deviations: Step-through pattern, Decreased step length - right, Decreased step length - left, Decreased stride length Gait velocity: reduced Gait velocity interpretation: <1.31 ft/sec, indicative of household ambulator   General Gait Details: more unsteady today though still mild.  More bias posteriorly so min assist overall.  better step though pattern, cues for looking up and straightening posture.   Stairs             Wheelchair Mobility     Tilt Bed    Modified Rankin (Stroke Patients Only)       Balance Overall balance assessment: Needs assistance Sitting-balance support: No upper extremity supported, Single extremity supported Sitting balance-Leahy Scale: Good     Standing balance support: Bilateral upper extremity supported, During functional activity, Reliant on assistive device for balance Standing balance-Leahy Scale: Poor Standing balance comment: reliant on either the RW or external support                            Cognition Arousal: Alert Behavior During Therapy: WFL for tasks assessed/performed Overall Cognitive Status: Within Functional Limits for tasks assessed                                          Exercises General Exercises - Lower Extremity Long  Arc Quad: AROM, Both, 10 reps, Seated    General Comments General comments (skin integrity, edema, etc.): vss on RA      Pertinent Vitals/Pain Pain Assessment Pain Assessment: Faces Faces Pain Scale: Hurts a little bit Pain Location: R foot. Pain Descriptors / Indicators: Sore Pain Intervention(s): Monitored during session    Home Living                          Prior Function            PT Goals (current goals can now be found in the care plan section) Acute Rehab PT Goals Patient Stated Goal: to  improve and return to being independent PT Goal Formulation: With patient/family Time For Goal Achievement: 09/24/23 Potential to Achieve Goals: Good Progress towards PT goals: Progressing toward goals    Frequency    Min 1X/week      PT Plan      Co-evaluation              AM-PAC PT "6 Clicks" Mobility   Outcome Measure  Help needed turning from your back to your side while in a flat bed without using bedrails?: A Little Help needed moving from lying on your back to sitting on the side of a flat bed without using bedrails?: A Little Help needed moving to and from a bed to a chair (including a wheelchair)?: A Little Help needed standing up from a chair using your arms (e.g., wheelchair or bedside chair)?: A Lot Help needed to walk in hospital room?: A Little Help needed climbing 3-5 steps with a railing? : A Lot 6 Click Score: 16    End of Session   Activity Tolerance: Patient tolerated treatment well Patient left: in bed;with call bell/phone within reach Nurse Communication: Mobility status PT Visit Diagnosis: Unsteadiness on feet (R26.81);Other abnormalities of gait and mobility (R26.89);Pain Pain - Right/Left: Right Pain - part of body: Ankle and joints of foot     Time: 1610-9604 PT Time Calculation (min) (ACUTE ONLY): 24 min  Charges:    $Gait Training: 8-22 mins $Therapeutic Activity: 8-22 mins PT General Charges $$ ACUTE PT VISIT: 1 Visit                     09/13/2023  Jacinto Halim., PT Acute Rehabilitation Services [phone]  (office)   [name] 09/13/2023, 12:14 PM

## 2023-09-13 NOTE — Progress Notes (Signed)
Progress Note    Grace Robinson   WUJ:[id number]  DOB: May 23, 1926  DOA: 09/06/2023     7 PCP: Mast, Man X, NP  Initial CC: fall at home  Hospital Course: Grace Robinson is a 89 yo female with PMH CHF s/p ICD, DMII, HLD, HoH, HTN, Meniere's disease, UC who presented after a mechanical fall at home.  She described feeling her legs as becoming weak and buckling causing her to fall onto her right side.  She was unable to ambulate after falling and crawled along the floor to obtain her phone.  She was then brought to the hospital for further evaluation. Right leg x-ray showed open distal fibular fracture.  She was evaluated by orthopedic surgery and taken to the OR for I&D of the open fracture and repair of lacerations.  She then underwent ORIF on 09/09/2023.  Interval History:  No events overnight. Affect and mood better this morning. Was about to work with PT when seen. Had some right foot discomfort but CAM boot inspected and everything look appropriate. Leg soft and warm too.   Assessment and Plan: * Open fracture of right distal fibula - s/p mechanical fall at home; no description of syncope at this time.  - s/p I&D and laceration repair with ortho on 11/6 - s/p ORIF on 11/8 - WBAT to RLE with CAM boot - wound vac to remain x 1 week per ortho rec's -Outpatient follow-up with orthopedic surgery, Dr. Lajoyce Corners 1 week after surgery -Evaluated by PT/OT.  SNF recommended for rehab  HTN (hypertension) - Continue metoprolol -BP now rebounded.  Resumed ARB  Type 2 diabetes mellitus with renal complication (HCC) - A1c was 7.5% in April 2024  - Check CBGs, continue long-acting insulin, add SSI for now -Decadron given during surgery on 09/09/2023.  Some elevation expected and should trend back to appropriate ranges - Continue current insulin dosing for now  IBS (irritable bowel syndrome) - takes dicyclomine chronically, 10 mg TID and imodium PRN too - family reports ~ 5 falls this year; some with  mixed etiology but given now advanced age and having been on this for ~8 years she says and sometimes can't tell any benefit, that it's reasonable to hold further use for now given that risks might start outweighing benefit - if diarrhea does get noticeably worse without then maybe a retrial of it to see if still beneficial, but her ongoing fall history is concerning enough to keep holding for now  Hypokalemia - Replete as needed  NICM (nonischemic cardiomyopathy) (HCC) - EF recovered - Appears compensated    Old records reviewed in assessment of this patient  Antimicrobials:   DVT prophylaxis:  heparin injection 5,000 Units Start: 09/07/23 1400 SCDs Start: 09/06/23 2352   Code Status:   Code Status: Limited: Do not attempt resuscitation (DNR) -DNR-LIMITED -Do Not Intubate/DNI   Mobility Assessment (Last 72 Hours)     Mobility Assessment     Row Name 09/13/23 1443 09/13/23 1206 09/13/23 1100 09/13/23 0830 09/12/23 1946   Does patient have an order for bedrest or is patient medically unstable No - Continue assessment -- -- No - Continue assessment No - Continue assessment   What is the highest level of mobility based on the progressive mobility assessment? Level 4 (Walks with assist in room) - Balance while marching in place and cannot step forward and back - Complete Level 4 (Walks with assist in room) - Balance while marching in place and cannot step forward and  back - Complete Level 4 (Walks with assist in room) - Balance while marching in place and cannot step forward and back - Complete Level 3 (Stands with assist) - Balance while standing  and cannot march in place Level 3 (Stands with assist) - Balance while standing  and cannot march in place   Is the above level different from baseline mobility prior to current illness? No - Consider discontinuing PT/OT -- -- Yes - Recommend PT order Yes - Recommend PT order    Row Name 09/12/23 1652 09/12/23 0830 09/11/23 2000 09/11/23 0835  09/10/23 2000   Does patient have an order for bedrest or is patient medically unstable -- No - Continue assessment No - Continue assessment No - Continue assessment No - Continue assessment   What is the highest level of mobility based on the progressive mobility assessment? Level 3 (Stands with assist) - Balance while standing  and cannot march in place Level 3 (Stands with assist) - Balance while standing  and cannot march in place Level 3 (Stands with assist) - Balance while standing  and cannot march in place Level 3 (Stands with assist) - Balance while standing  and cannot march in place Level 3 (Stands with assist) - Balance while standing  and cannot march in place   Is the above level different from baseline mobility prior to current illness? -- Yes - Recommend PT order Yes - Recommend PT order Yes - Recommend PT order Yes - Recommend PT order            Barriers to discharge: none Disposition Plan:  SNF Status is: Inpt  Objective: Blood pressure (!) 149/72, pulse 79, temperature 98.3 F (36.8 C), temperature source Oral, resp. rate 18, weight 61.3 kg, SpO2 100%.  Examination:  Physical Exam Constitutional:      Appearance: Normal appearance.     Comments: Hard of hearing noted but able to still interact easily  HENT:     Head: Normocephalic and atraumatic.     Mouth/Throat:     Mouth: Mucous membranes are moist.  Eyes:     Extraocular Movements: Extraocular movements intact.  Cardiovascular:     Rate and Rhythm: Normal rate and regular rhythm.  Pulmonary:     Effort: Pulmonary effort is normal. No respiratory distress.     Breath sounds: Normal breath sounds. No wheezing.  Abdominal:     General: Bowel sounds are normal. There is no distension.     Palpations: Abdomen is soft.     Tenderness: There is no abdominal tenderness.  Musculoskeletal:     Cervical back: Normal range of motion and neck supple.     Comments: Right leg in cam boot with WV in place; canister had  no blood noted or drainage  Skin:    General: Skin is warm and dry.  Neurological:     General: No focal deficit present.     Mental Status: She is alert.  Psychiatric:        Mood and Affect: Mood normal.      Consultants:  Orthopedic surgery  Procedures:   09/07/23:  PROCEDURE:  Irrigation and debridement of bone, periosteum, subcutaneous tissue, skin associated with open right ankle fracture dislocation Complex repair right ankle traumatic lacerations 15 cm Application of short leg splint  09/09/23:  PROCEDURE:   OPEN REDUCTION INTERNAL FIXATION (ORIF) fibula FRACTURE. Internal fixation syndesmosis. Application Kerecis micro graft 38 cm. Application axial form wound VAC dressing.  Data Reviewed: Results for  orders placed or performed during the hospital encounter of 09/06/23 (from the past 24 hour(s))  Glucose, capillary     Status: Abnormal   Collection Time: 09/12/23  3:13 PM  Result Value Ref Range   Glucose-Capillary 239 (H) 70 - 99 mg/dL  Glucose, capillary     Status: Abnormal   Collection Time: 09/12/23  8:43 PM  Result Value Ref Range   Glucose-Capillary 227 (H) 70 - 99 mg/dL  Glucose, capillary     Status: Abnormal   Collection Time: 09/13/23  8:22 AM  Result Value Ref Range   Glucose-Capillary 129 (H) 70 - 99 mg/dL  Glucose, capillary     Status: Abnormal   Collection Time: 09/13/23 11:56 AM  Result Value Ref Range   Glucose-Capillary 312 (H) 70 - 99 mg/dL    I have reviewed pertinent nursing notes, vitals, labs, and images as necessary. I have ordered labwork to follow up on as indicated.  I have reviewed the last notes from staff over past 24 hours. I have discussed patient's care plan and test results with nursing staff, CM/SW, and other staff as appropriate.    LOS: 7 days   Lewie Chamber, MD [facility] 09/13/2023, 3:08 PM

## 2023-09-13 NOTE — Progress Notes (Signed)
Occupational Therapy Treatment Patient Details Name: Grace Robinson MRN: [id number] DOB: 1926/09/17 Today's Date: 09/13/2023   History of present illness Pt is a 89 y.o. female who presented 09/06/23 s/p fall in which pt sustained a type 3a open fx dislocation of her R ankle. S/p I&D 11/6, ORIF 11/8. PMH: CHF s/p ICD, DMII, HLD, HoH, HTN, Meniere's disease, UC, breast cancer, DM   OT comments  Pt progressing well towards OT goals this session. Max A to don boot and extra shoe on L foot. Initially mod A for sit<>stand which improved with multiple sit<>stand throughout session from bed, BSC x3, and recliner. Pt benefits from cues for hand placement. Pt was able to participate in UB grooming while seated at sink, max A for peri care in standing. OT will continue to follow acutely. POC remains appropriate.      If plan is discharge home, recommend the following:  A little help with walking and/or transfers;A lot of help with bathing/dressing/bathroom;Assistance with cooking/housework;Assist for transportation;Help with stairs or ramp for entrance   Equipment Recommendations  Other (comment) (defer to next venue of care)    Recommendations for Other Services PT consult    Precautions / Restrictions Precautions Precautions: Fall Precaution Comments: wound vac R lower leg Restrictions Weight Bearing Restrictions: Yes RLE Weight Bearing: Weight bearing as tolerated Other Position/Activity Restrictions: in CAM boot       Mobility Bed Mobility Overal bed mobility: Needs Assistance Bed Mobility: Sit to Supine     Supine to sit: Min assist, HOB elevated     General bed mobility comments: Pt initiated bringing her legs off L EOB and ascending her trunk well to sit up L EOB but needed minA to scoot hips to EOB.    Transfers Overall transfer level: Needs assistance Equipment used: Rolling walker (2 wheels) Transfers: Sit to/from Stand Sit to Stand: Mod assist           General  transfer comment: cues for hand placement ascent/descent.  assist for help forward more than boost. min A once on her feet     Balance Overall balance assessment: Needs assistance Sitting-balance support: No upper extremity supported, Feet supported Sitting balance-Leahy Scale: Good     Standing balance support: Bilateral upper extremity supported, During functional activity, Reliant on assistive device for balance Standing balance-Leahy Scale: Poor Standing balance comment: reliant on either the RW or external support                           ADL either performed or assessed with clinical judgement   ADL Overall ADL's : Needs assistance/impaired     Grooming: Oral care;Wash/dry face;Set up;Sitting Grooming Details (indicate cue type and reason): at sink             Lower Body Dressing: Maximal assistance;Sitting/lateral leans Lower Body Dressing Details (indicate cue type and reason): to don CAM boot and extra shoe Toilet Transfer: Ambulation;Rolling walker (2 wheels);Moderate assistance;Minimal assistance Toilet Transfer Details (indicate cue type and reason): mod A to rise, min A for ambulation Toileting- Clothing Manipulation and Hygiene: Maximal assistance;Sit to/from stand Toileting - Clothing Manipulation Details (indicate cue type and reason): able to maintain standing for OT to perform front and rear peri care     Functional mobility during ADLs: Minimal assistance;Cueing for safety;Cueing for sequencing;Rolling walker (2 wheels) General ADL Comments: decreased access to LB for ADL, decreased balance in standing, increased assists for transfers  Extremity/Trunk Assessment Upper Extremity Assessment Upper Extremity Assessment: Generalized weakness            Vision       Perception     Praxis      Cognition Arousal: Alert Behavior During Therapy: WFL for tasks assessed/performed Overall Cognitive Status: Within Functional Limits for tasks  assessed                                          Exercises      Shoulder Instructions       General Comments VSS on RA    Pertinent Vitals/ Pain       Pain Assessment Pain Assessment: Faces Faces Pain Scale: Hurts a little bit Pain Descriptors / Indicators: Sore Pain Intervention(s): Monitored during session, Repositioned  Home Living                                          Prior Functioning/Environment              Frequency  Min 1X/week        Progress Toward Goals  OT Goals(current goals can now be found in the care plan section)  Progress towards OT goals: Progressing toward goals  Acute Rehab OT Goals Patient Stated Goal: get back to independent OT Goal Formulation: With patient/family Time For Goal Achievement: 09/24/23 Potential to Achieve Goals: Good  Plan      Co-evaluation                 AM-PAC OT "6 Clicks" Daily Activity     Outcome Measure   Help from another person eating meals?: None Help from another person taking care of personal grooming?: A Little Help from another person toileting, which includes using toliet, bedpan, or urinal?: A Lot Help from another person bathing (including washing, rinsing, drying)?: A Lot Help from another person to put on and taking off regular upper body clothing?: A Little Help from another person to put on and taking off regular lower body clothing?: A Lot 6 Click Score: 16    End of Session    OT Visit Diagnosis: Unsteadiness on feet (R26.81);Other abnormalities of gait and mobility (R26.89);History of falling (Z91.81)   Activity Tolerance     Patient Left     Nurse Communication          Time: 5366-4403 OT Time Calculation (min): 35 min  Charges: OT General Charges $OT Visit: 1 Visit OT Treatments $Self Care/Home Management : 23-37 mins  Nyoka Cowden OTR/L Acute Rehabilitation Services Office: [phone]  Evern Bio [facility] 09/13/2023,  11:22 AM

## 2023-09-14 DIAGNOSIS — S82851D Displaced trimalleolar fracture of right lower leg, subsequent encounter for closed fracture with routine healing: Secondary | ICD-10-CM | POA: Diagnosis not present

## 2023-09-14 DIAGNOSIS — E1122 Type 2 diabetes mellitus with diabetic chronic kidney disease: Secondary | ICD-10-CM | POA: Diagnosis not present

## 2023-09-14 DIAGNOSIS — R200 Anesthesia of skin: Secondary | ICD-10-CM | POA: Diagnosis not present

## 2023-09-14 DIAGNOSIS — Z9889 Other specified postprocedural states: Secondary | ICD-10-CM | POA: Diagnosis not present

## 2023-09-14 DIAGNOSIS — S82891C Other fracture of right lower leg, initial encounter for open fracture type IIIA, IIIB, or IIIC: Secondary | ICD-10-CM | POA: Diagnosis not present

## 2023-09-14 DIAGNOSIS — I700 Atherosclerosis of aorta: Secondary | ICD-10-CM | POA: Diagnosis not present

## 2023-09-14 DIAGNOSIS — Z66 Do not resuscitate: Secondary | ICD-10-CM | POA: Diagnosis not present

## 2023-09-14 DIAGNOSIS — Z95810 Presence of automatic (implantable) cardiac defibrillator: Secondary | ICD-10-CM | POA: Diagnosis not present

## 2023-09-14 DIAGNOSIS — R600 Localized edema: Secondary | ICD-10-CM | POA: Diagnosis not present

## 2023-09-14 DIAGNOSIS — W19XXXA Unspecified fall, initial encounter: Secondary | ICD-10-CM | POA: Diagnosis not present

## 2023-09-14 DIAGNOSIS — Z23 Encounter for immunization: Secondary | ICD-10-CM | POA: Diagnosis present

## 2023-09-14 DIAGNOSIS — Z8781 Personal history of (healed) traumatic fracture: Secondary | ICD-10-CM | POA: Diagnosis not present

## 2023-09-14 DIAGNOSIS — K589 Irritable bowel syndrome without diarrhea: Secondary | ICD-10-CM | POA: Diagnosis not present

## 2023-09-14 DIAGNOSIS — W1830XA Fall on same level, unspecified, initial encounter: Secondary | ICD-10-CM | POA: Diagnosis not present

## 2023-09-14 DIAGNOSIS — M79661 Pain in right lower leg: Secondary | ICD-10-CM | POA: Diagnosis not present

## 2023-09-14 DIAGNOSIS — S91001D Unspecified open wound, right ankle, subsequent encounter: Secondary | ICD-10-CM | POA: Diagnosis not present

## 2023-09-14 DIAGNOSIS — R531 Weakness: Secondary | ICD-10-CM | POA: Diagnosis not present

## 2023-09-14 DIAGNOSIS — S82851C Displaced trimalleolar fracture of right lower leg, initial encounter for open fracture type IIIA, IIIB, or IIIC: Secondary | ICD-10-CM | POA: Diagnosis not present

## 2023-09-14 DIAGNOSIS — S91001A Unspecified open wound, right ankle, initial encounter: Secondary | ICD-10-CM | POA: Diagnosis not present

## 2023-09-14 DIAGNOSIS — R41841 Cognitive communication deficit: Secondary | ICD-10-CM | POA: Diagnosis not present

## 2023-09-14 DIAGNOSIS — K5732 Diverticulitis of large intestine without perforation or abscess without bleeding: Secondary | ICD-10-CM | POA: Diagnosis not present

## 2023-09-14 DIAGNOSIS — E785 Hyperlipidemia, unspecified: Secondary | ICD-10-CM | POA: Diagnosis not present

## 2023-09-14 DIAGNOSIS — M1990 Unspecified osteoarthritis, unspecified site: Secondary | ICD-10-CM | POA: Diagnosis not present

## 2023-09-14 DIAGNOSIS — M8580 Other specified disorders of bone density and structure, unspecified site: Secondary | ICD-10-CM | POA: Diagnosis not present

## 2023-09-14 DIAGNOSIS — S9304XA Dislocation of right ankle joint, initial encounter: Secondary | ICD-10-CM | POA: Diagnosis not present

## 2023-09-14 DIAGNOSIS — L03115 Cellulitis of right lower limb: Secondary | ICD-10-CM | POA: Diagnosis not present

## 2023-09-14 DIAGNOSIS — R2689 Other abnormalities of gait and mobility: Secondary | ICD-10-CM | POA: Diagnosis not present

## 2023-09-14 DIAGNOSIS — S82831B Other fracture of upper and lower end of right fibula, initial encounter for open fracture type I or II: Secondary | ICD-10-CM | POA: Diagnosis not present

## 2023-09-14 DIAGNOSIS — D0511 Intraductal carcinoma in situ of right breast: Secondary | ICD-10-CM | POA: Diagnosis not present

## 2023-09-14 DIAGNOSIS — I5022 Chronic systolic (congestive) heart failure: Secondary | ICD-10-CM | POA: Diagnosis not present

## 2023-09-14 DIAGNOSIS — S82831C Other fracture of upper and lower end of right fibula, initial encounter for open fracture type IIIA, IIIB, or IIIC: Secondary | ICD-10-CM | POA: Diagnosis not present

## 2023-09-14 DIAGNOSIS — I10 Essential (primary) hypertension: Secondary | ICD-10-CM | POA: Diagnosis not present

## 2023-09-14 DIAGNOSIS — E876 Hypokalemia: Secondary | ICD-10-CM | POA: Diagnosis not present

## 2023-09-14 DIAGNOSIS — M79604 Pain in right leg: Secondary | ICD-10-CM | POA: Diagnosis not present

## 2023-09-14 DIAGNOSIS — Z7401 Bed confinement status: Secondary | ICD-10-CM | POA: Diagnosis not present

## 2023-09-14 DIAGNOSIS — E1129 Type 2 diabetes mellitus with other diabetic kidney complication: Secondary | ICD-10-CM | POA: Diagnosis not present

## 2023-09-14 DIAGNOSIS — S82891A Other fracture of right lower leg, initial encounter for closed fracture: Secondary | ICD-10-CM | POA: Diagnosis not present

## 2023-09-14 DIAGNOSIS — I428 Other cardiomyopathies: Secondary | ICD-10-CM | POA: Diagnosis not present

## 2023-09-14 LAB — GLUCOSE, CAPILLARY
Glucose-Capillary: 162 mg/dL — ABNORMAL HIGH (ref 70–99)
Glucose-Capillary: 198 mg/dL — ABNORMAL HIGH (ref 70–99)

## 2023-09-14 MED ORDER — OXYCODONE HCL 5 MG PO TABS: 5.0000 mg | ORAL_TABLET | ORAL | 0 refills | Status: DC | PRN

## 2023-09-14 MED ORDER — POLYETHYLENE GLYCOL 3350 17 G PO PACK: 17.0000 g | PACK | Freq: Every day | ORAL | Status: DC | PRN

## 2023-09-14 MED ORDER — DOCUSATE SODIUM 100 MG PO CAPS: 100.0000 mg | ORAL_CAPSULE | Freq: Two times a day (BID) | ORAL | Status: DC

## 2023-09-14 MED ORDER — TOUJEO MAX SOLOSTAR 300 UNIT/ML ~~LOC~~ SOPN: 30.0000 [IU] | PEN_INJECTOR | Freq: Every day | SUBCUTANEOUS | Status: DC

## 2023-09-14 NOTE — Plan of Care (Signed)
  Problem: Education: Goal: Knowledge of General Education information will improve Description: Including pain rating scale, medication(s)/side effects and non-pharmacologic comfort measures Outcome: Progressing   Problem: Clinical Measurements: Goal: Ability to maintain clinical measurements within normal limits will improve Outcome: Progressing   Problem: Activity: Goal: Risk for activity intolerance will decrease Outcome: Progressing   Problem: Nutrition: Goal: Adequate nutrition will be maintained Outcome: Progressing   Problem: Coping: Goal: Level of anxiety will decrease Outcome: Progressing   Problem: Pain Management: Goal: General experience of comfort will improve Outcome: Progressing

## 2023-09-14 NOTE — Progress Notes (Signed)
Discharge (AVS )summary/pertinent documents provided to PTAR, Pt d/t to [facility] as ordered, She remains alert/oriented in no apparent distress. No complaints.

## 2023-09-14 NOTE — TOC Transition Note (Signed)
Transition of Care [facility]) - CM/SW Discharge Note   Patient Details  Name: Grace Robinson MRN: [id number] Date of Birth: [birth year]  Transition of Care [facility] CM/SW Contact:  Deatra Robinson, [place] Phone Number: 09/14/2023, 12:10 PM   Clinical Narrative:  pt for dc to [facility] today. Auth received (ref #[id number], 11/12-11/14) and details provided to [facility] in admissions who confirmed they are prepared to admit pt to [facility], room 23. Pt aware of dc and reports agreeable. Voicemail left for pt's son Jomarie Longs notifying him of dc. RN provided with number for report and PTAR arranged for transport. SW signing off at dc.   Dellie Burns, MSW, LCSW [phone] (coverage)       Final next level of care: Skilled Nursing Facility Barriers to Discharge: Barriers Resolved   Patient Goals and CMS Choice CMS Medicare.gov Compare Post Acute Care list provided to:: Patient Choice offered to / list presented to : Patient  Discharge Placement                Patient chooses bed at: [facility] Patient to be transferred to facility by: PTAR Name of family member notified: Patient Ox4 and voicemail left for son Patient and family notified of of transfer: 09/14/23  Discharge Plan and Services Additional resources added to the After Visit Summary for   In-house Referral: Clinical Social Work                                   Social Determinants of Health (SDOH) Interventions SDOH Screenings   Food Insecurity: No Food Insecurity (09/13/2023)  Housing: Low Risk  (09/13/2023)  Transportation Needs: No Transportation Needs (09/13/2023)  Utilities: Not At Risk (09/13/2023)  Depression (PHQ2-9): Low Risk  (07/29/2023)  Financial Resource Strain: Low Risk  (12/08/2022)  Tobacco Use: Medium Risk (09/06/2023)     Readmission Risk Interventions     No data to display

## 2023-09-14 NOTE — Discharge Summary (Signed)
Physician Discharge Summary  Grace Robinson WUJ:[id number] DOB: 12-Jun-1926 DOA: 09/06/2023  PCP: Grace Robinson  Admit date: 09/06/2023 Discharge date: 09/14/2023  Admitted From: Home Disposition:  SNF  Discharge Condition:Stable CODE STATUS:DNR Diet recommendation: Carb Modified  Brief/Interim Summary: Grace Robinson is a 89 yo female with PMH CHF s/p ICD, DMII, HLD, HoH, HTN, Meniere's disease, UC who presented after a mechanical fall at home.  She described feeling her legs as becoming weak and buckling causing her to fall onto her right side.  She was unable to ambulate after falling and crawled along the floor to obtain her phone.  She was then brought to the hospital for further evaluation. Right leg x-ray showed open distal fibular fracture.  She was evaluated by orthopedic surgery and taken to the OR for I&D of the open fracture and repair of lacerations.  She then underwent ORIF on 09/09/2023.  PT/OT recommending SNF on discharge.  Medically stable for discharge to SNF today.  She needs to follow-up with Grace Robinson in a week, she has been discharged with wound VAC.  Following problems were addressed during the hospitalization:  Open fracture of right distal fibula - s/p mechanical fall at home; no description of syncope at this time.  - s/p I&D and laceration repair with ortho on 11/6 - s/p ORIF on 11/8 - WBAT to RLE with CAM boot - wound vac to remain x 1 week per ortho rec's -Outpatient follow-up with orthopedic surgery, Grace Robinson 1 week after surgery -Evaluated by PT/OT.  SNF recommended for rehab   HTN (hypertension) - Continue metoprolol -Resumed ARB   Type 2 diabetes mellitus with renal complication (HCC) - A1c was 7.5% in April 2024  - Takes Toujeo at home, continue 30 units daily for now.  Monitor blood sugars   NICM (nonischemic cardiomyopathy) (HCC) - EF recovered - Appears compensated   Discharge Diagnoses:  Principal Problem:   Open fracture of right distal  fibula Active Problems:   HTN (hypertension)   Type 2 diabetes mellitus with renal complication (HCC)   IBS (irritable bowel syndrome)   NICM (nonischemic cardiomyopathy) (HCC)   Ductal carcinoma in situ (DCIS) of right breast   Dislocation of ankle, right, open, initial encounter   Hypokalemia   Open trimalleolar fracture of ankle, right, type III, initial encounter    Discharge Instructions  Discharge Instructions     Diet Carb Modified   Complete by: As directed    Discharge instructions   Complete by: As directed    1)Please take your medications as instructed 2)Follow up with Grace Robinson in a week   Increase activity slowly   Complete by: As directed    No wound care   Complete by: As directed       Allergies as of 09/14/2023       Reactions   Coreg [carvedilol] Other (See Comments)   Unknown reaction   Firvanq [vancomycin] Itching, Other (See Comments)   Red Man Syndrome   Lactose Intolerance (gi)    Lanoxin [digoxin] Other (See Comments)   DIG toxicity   Sudafed [pseudoephedrine] Other (See Comments)   Makes skin feel like its crawling        Medication List     TAKE these medications    acetaminophen 500 MG tablet Commonly known as: TYLENOL Take 500 mg by mouth every 8 (eight) hours as needed for headache.   alendronate 70 MG tablet Commonly known as: FOSAMAX Take 1 tablet (70 mg total)  by mouth every 7 (seven) days. Take with a full glass of water on an empty stomach. What changed:  when to take this additional instructions   aspirin EC 81 MG tablet Take 81 mg by mouth daily.   atorvastatin 40 MG tablet Commonly known as: LIPITOR Take 40 mg by mouth daily.   Dexcom G6 Receiver Devi 1 Device by Does not apply route as directed.   Dexcom G6 Sensor Misc 1 Device by Does not apply route as directed.   Dexcom G6 Transmitter Misc 1 Device by Does not apply route as directed.   dicyclomine 10 MG capsule Commonly known as: BENTYL TAKE 1  CAPSULE BY MOUTH THREE TIMES DAILY 30 MINUTES BEFORE MEALS   furosemide 20 MG tablet Commonly known as: LASIX Take 1 tablet (20 mg total) by mouth daily. alternating (40 mg) every other day. What changed: additional instructions   metoprolol succinate 100 MG 24 hr tablet Commonly known as: TOPROL-XL Take 100 mg by mouth 2 (two) times daily.   olmesartan 40 MG tablet Commonly known as: BENICAR Take 40 mg by mouth daily.   oxyCODONE 5 MG immediate release tablet Commonly known as: Oxy IR/ROXICODONE Take 1 tablet (5 mg total) by mouth every 4 (four) hours as needed for moderate pain (pain score 4-6) (pain score 4-6).   polyethylene glycol 17 g packet Commonly known as: MIRALAX / GLYCOLAX Take 17 g by mouth daily as needed for mild constipation.   potassium chloride 10 MEQ tablet Commonly known as: KLOR-CON M Take 10 mEq by mouth 2 (two) times daily.   Toujeo Max SoloStar 300 UNIT/ML Solostar Pen Generic drug: insulin glargine (2 Unit Dial) Inject 30 Units into the skin daily. What changed: how much to take        Follow-up Information     Grace Robinson Follow up in 1 week(s).   Specialty: Orthopedic Surgery Contact information: [address] [place] [address] [phone]                Allergies  Allergen Reactions   Coreg [Carvedilol] Other (See Comments)    Unknown reaction   Firvanq [Vancomycin] Itching and Other (See Comments)    Red Man Syndrome   Lactose Intolerance (Gi)    Lanoxin [Digoxin] Other (See Comments)    DIG toxicity   Sudafed [Pseudoephedrine] Other (See Comments)    Makes skin feel like its crawling     Consultations: Orthopedics   Procedures/Studies: DG MINI C-ARM IMAGE ONLY  Result Date: 09/09/2023 There is no interpretation for this exam.  This order is for images obtained during a surgical procedure.  Please See "Surgeries" Tab for more information regarding the procedure.   DG Tibia/Fibula Right  Result  Date: 09/06/2023 CLINICAL DATA:  Fall EXAM: RIGHT TIBIA AND FIBULA - 2 VIEW COMPARISON:  None Available. FINDINGS: Oblique fracture through the distal right fibular metaphysis. Widening of the medial ankle mortise. No visible tibial fracture. Chondrocalcinosis and degenerative changes in the knee joint. No joint effusion. IMPRESSION: Distal fibular metaphyseal fracture with widening of the ankle mortise. Electronically Signed   By: Charlett Nose M.D.   On: 09/06/2023 23:05   DG Shoulder Right  Result Date: 09/06/2023 CLINICAL DATA:  Fall EXAM: RIGHT SHOULDER - 2+ VIEW COMPARISON:  None Available. FINDINGS: Degenerative changes in the [facility] joint with joint space narrowing and spurring. Glenohumeral joint is maintained. No acute bony abnormality. Specifically, no fracture, subluxation, or dislocation. Soft tissues are intact. IMPRESSION: Degenerative  changes in the [facility] joint.  No acute bony abnormality. Electronically Signed   By: Charlett Nose M.D.   On: 09/06/2023 23:04   DG Forearm Right  Result Date: 09/06/2023 CLINICAL DATA:  Fall EXAM: RIGHT FOREARM - 2 VIEW COMPARISON:  None Available. FINDINGS: No acute bony abnormality. Specifically, no fracture, subluxation, or dislocation. Chondrocalcinosis in the triangular fibrocartilage. Soft tissues intact. IMPRESSION: No acute bony abnormality. Electronically Signed   By: Charlett Nose M.D.   On: 09/06/2023 23:04   DG Elbow Complete Right  Result Date: 09/06/2023 CLINICAL DATA:  Fall EXAM: RIGHT ELBOW - COMPLETE 3+ VIEW COMPARISON:  None Available. FINDINGS: No acute bony abnormality. Specifically, no fracture, subluxation, or dislocation. No joint effusion. Soft tissues are intact. IMPRESSION: No acute bony abnormality. Electronically Signed   By: Charlett Nose M.D.   On: 09/06/2023 23:03   DG Ankle Right Port  Result Date: 09/06/2023 CLINICAL DATA:  Fall, ankle pain EXAM: PORTABLE RIGHT ANKLE - 2 VIEW COMPARISON:  None Available. FINDINGS: Oblique fracture  in the distal right fibular metaphysis. No visible tibial fracture. There is widening of the medial ankle mortise. IMPRESSION: Distal fibular fracture with widening of the medial ankle mortise. Electronically Signed   By: Charlett Nose M.D.   On: 09/06/2023 23:01   CUP PACEART REMOTE DEVICE CHECK  Result Date: 08/31/2023 Scheduled remote reviewed. Normal device function.  Next remote 91 days. ML, CVRS     Subjective: Patient seen and examined the bedside today.  Hemodynamically stable comfortable.  Lying in bed.  No new complaints.  Medically stable for discharge to SNF today.  I called her son Denetra Hirata to update about the planning, call not received  Discharge Exam: Vitals:   09/14/23 0710 09/14/23 1000  BP: (!) 186/68 136/66  Pulse: 65 68  Resp: 18 17  Temp: 98.2 F (36.8 C) 98.3 F (36.8 C)  SpO2: 100% 100%   Vitals:   09/14/23 0500 09/14/23 0710 09/14/23 1000 09/14/23 1039  BP:  (!) 186/68 136/66   Pulse:  65 68   Resp:  18 17   Temp:  98.2 F (36.8 C) 98.3 F (36.8 C)   TempSrc:  Oral Oral   SpO2:  100% 100%   Weight: 55 kg   55 kg  Height:    5\' 6"  (1.676 m)    General: Pt is alert, awake, not in acute distress, pleasant elderly female Cardiovascular: RRR, S1/S2 +, no rubs, no gallops Respiratory: CTA bilaterally, no wheezing, no rhonchi Abdominal: Soft, NT, ND, bowel sounds + Extremities: no edema, no cyanosis, dressing on the left leg, wound VAC    The results of significant diagnostics from this hospitalization (including imaging, microbiology, ancillary and laboratory) are listed below for reference.     Microbiology: Recent Results (from the past 240 hour(s))  MRSA Next Gen by PCR, Nasal     Status: None   Collection Time: 09/09/23  3:13 AM   Specimen: Nasal Mucosa; Nasal Swab  Result Value Ref Range Status   MRSA by PCR Next Gen NOT DETECTED NOT DETECTED Final    Comment: (NOTE) The GeneXpert MRSA Assay (FDA approved for NASAL specimens  only), is one component of a comprehensive MRSA colonization surveillance program. It is not intended to diagnose MRSA infection nor to guide or monitor treatment for MRSA infections. Test performance is not FDA approved in patients less than 56 years old. Performed at [facility], [address] [place], [place] [address]  Labs: BNP (last 3 results) No results for input(s): "BNP" in the last 8760 hours. Basic Metabolic Panel: Recent Labs  Lab 09/08/23 0615 09/09/23 0559 09/10/23 0530  NA 140 141 138  K 3.3* 4.3 3.8  CL 106 108 102  CO2 24 25 25   GLUCOSE 199* 143* 181*  BUN 25* 23 22  CREATININE 0.93 0.94 0.92  CALCIUM 8.5* 8.7* 8.3*  MG 2.0 2.1 2.2   Liver Function Tests: No results for input(s): "AST", "ALT", "ALKPHOS", "BILITOT", "PROT", "ALBUMIN" in the last 168 hours. No results for input(s): "LIPASE", "AMYLASE" in the last 168 hours. No results for input(s): "AMMONIA" in the last 168 hours. CBC: Recent Labs  Lab 09/08/23 0615 09/09/23 0559 09/10/23 0530  WBC 12.5* 11.9* 8.1  HGB 11.3* 11.6* 10.5*  HCT 33.4* 34.9* 31.9*  MCV 95.4 97.5 95.8  PLT 137* 136* 119*   Cardiac Enzymes: No results for input(s): "CKTOTAL", "CKMB", "CKMBINDEX", "TROPONINI" in the last 168 hours. BNP: Invalid input(s): "POCBNP" CBG: Recent Labs  Lab 09/13/23 1156 09/13/23 1619 09/13/23 2048 09/14/23 0708 09/14/23 1110  GLUCAP 312* 171* 268* 162* 198*   D-Dimer No results for input(s): "DDIMER" in the last 72 hours. Hgb A1c No results for input(s): "HGBA1C" in the last 72 hours. Lipid Profile No results for input(s): "CHOL", "HDL", "LDLCALC", "TRIG", "CHOLHDL", "LDLDIRECT" in the last 72 hours. Thyroid function studies No results for input(s): "TSH", "T4TOTAL", "T3FREE", "THYROIDAB" in the last 72 hours.  Invalid input(s): "FREET3" Anemia work up No results for input(s): "VITAMINB12", "FOLATE", "FERRITIN", "TIBC", "IRON", "RETICCTPCT" in the last 72  hours. Urinalysis No results found for: "COLORURINE", "APPEARANCEUR", "LABSPEC", "PHURINE", "GLUCOSEU", "HGBUR", "BILIRUBINUR", "KETONESUR", "PROTEINUR", "UROBILINOGEN", "NITRITE", "LEUKOCYTESUR" Sepsis Labs Recent Labs  Lab 09/08/23 0615 09/09/23 0559 09/10/23 0530  WBC 12.5* 11.9* 8.1   Microbiology Recent Results (from the past 240 hour(s))  MRSA Next Gen by PCR, Nasal     Status: None   Collection Time: 09/09/23  3:13 AM   Specimen: Nasal Mucosa; Nasal Swab  Result Value Ref Range Status   MRSA by PCR Next Gen NOT DETECTED NOT DETECTED Final    Comment: (NOTE) The GeneXpert MRSA Assay (FDA approved for NASAL specimens only), is one component of a comprehensive MRSA colonization surveillance program. It is not intended to diagnose MRSA infection nor to guide or monitor treatment for MRSA infections. Test performance is not FDA approved in patients less than 5 years old. Performed at [facility], [address], [place], [place] [address]     Please note: You were cared for by a hospitalist during your hospital stay. Once you are discharged, your primary care physician will handle any further medical issues. Please note that NO REFILLS for any discharge medications will be authorized once you are discharged, as it is imperative that you return to your primary care physician (or establish a relationship with a primary care physician if you do not have one) for your post hospital discharge needs so that they can reassess your need for medications and monitor your lab values.    Time coordinating discharge: 40 minutes  SIGNED:   Burnadette Pop, Robinson  [facility] 09/14/2023, 11:22 AM Pager [phone]  If 7PM-7AM, please contact night-coverage [email or web address] Password [email or web address]

## 2023-09-14 NOTE — Progress Notes (Signed)
Report given to [facility] staff nurse at [facility], all questions and concerns were fully answered,

## 2023-09-15 ENCOUNTER — Non-Acute Institutional Stay (SKILLED_NURSING_FACILITY): Payer: Medicare Other | Admitting: Sports Medicine

## 2023-09-15 ENCOUNTER — Encounter: Payer: Self-pay | Admitting: Sports Medicine

## 2023-09-15 DIAGNOSIS — I10 Essential (primary) hypertension: Secondary | ICD-10-CM

## 2023-09-15 DIAGNOSIS — E1122 Type 2 diabetes mellitus with diabetic chronic kidney disease: Secondary | ICD-10-CM

## 2023-09-15 DIAGNOSIS — S82831F Other fracture of upper and lower end of right fibula, subsequent encounter for open fracture type IIIA, IIIB, or IIIC with routine healing: Secondary | ICD-10-CM

## 2023-09-15 DIAGNOSIS — Z794 Long term (current) use of insulin: Secondary | ICD-10-CM

## 2023-09-15 DIAGNOSIS — N183 Chronic kidney disease, stage 3 unspecified: Secondary | ICD-10-CM

## 2023-09-15 DIAGNOSIS — M8580 Other specified disorders of bone density and structure, unspecified site: Secondary | ICD-10-CM | POA: Diagnosis not present

## 2023-09-15 DIAGNOSIS — K589 Irritable bowel syndrome without diarrhea: Secondary | ICD-10-CM

## 2023-09-15 DIAGNOSIS — I5022 Chronic systolic (congestive) heart failure: Secondary | ICD-10-CM

## 2023-09-15 DIAGNOSIS — Z780 Asymptomatic menopausal state: Secondary | ICD-10-CM

## 2023-09-15 NOTE — Progress Notes (Signed)
Provider:  Venita Sheffield, MD Location:   [facility]   Place of Service:   Skilled care  PCP: Mast, Man X, NP Patient Care Team: Mast, Man X, NP as PCP - General (Internal Medicine) Abigail Miyamoto, MD as Consulting Physician (General Surgery) Rachel Moulds, MD as Consulting Physician (Hematology and Oncology) Dorothy Puffer, MD as Consulting Physician (Radiation Oncology)  Extended Emergency Contact Information Primary Emergency Contact: [name] Address: [address]          [place], [place] [address] [place] of [place] Home Phone: [phone] Mobile Phone: [phone] Relation: Son  Code Status:  Goals of Care: Advanced Directive information    09/14/2023   10:25 AM  Advanced Directives  Would patient like information on creating a medical advance directive? No - Patient declined      No chief complaint on file.   HPI: Patient is a 89 y.o. female seen today for admission to skilled care after her recent hospitalization after a fall which resulted in Rt ankle fracture  Pt seen and examined in her room Pt reported that she was standing near dresser in her room, all of a sudden felt weak and fell Denies being dizzy or lightheaded prior to the fall She c/o intermittent sharp pain in her ankle Evaluated by PT this morning  Pt denies fevers, chills, cough, SOB, abdominal pain, nausea, vomiting, dysuria, hematuria, bloody or dark stools. Had bowel movement this morning     As per discharge summary  '' Open fracture of right distal fibula - s/p mechanical fall at home; no description of syncope at this time.  - s/p I&D and laceration repair with ortho on 11/6 - s/p ORIF on 11/8 - WBAT to RLE with CAM boot - wound vac to remain x 1 week per ortho rec's -Outpatient follow-up with orthopedic surgery, Dr. Lajoyce Corners 1 week after surgery -Evaluated by PT/OT.  SNF recommended for rehab''    Past Medical History:  Diagnosis Date    Biventricular ICD (implantable cardiac defibrillator) in place    downgraded from CRT-D to CRT-P 08/30/22   Breast cancer (HCC)    Cellulitis    Change in bowel habits    CHF (congestive heart failure) (HCC)    Colon polyps    Diabetes mellitus    Type II   Diarrhea    Dyslipidemia    HOH (hard of hearing)    left   HTN (hypertension)    LBBB (left bundle branch block)    chronic   Meniere's disease    Nonischemic cardiomyopathy (HCC) 2006   Presence of permanent cardiac pacemaker    T wave oversensing 01/17/2014   Ulcerative colitis    Varicose veins    4 lazer  treatment each leg   Past Surgical History:  Procedure Laterality Date   ABDOMINAL HYSTERECTOMY     BIV PACEMAKER GENERATOR CHANGEOUT N/A 08/30/2022   Procedure: BIV PACEMAKER GENERATOR CHANGEOUT;  Surgeon: Duke Salvia, MD;  Location: [facility] INVASIVE CV LAB;  Service: Cardiovascular;  Laterality: N/A;   BREAST LUMPECTOMY WITH RADIOACTIVE SEED LOCALIZATION Right 01/05/2023   Procedure: RIGHT BREAST BRACKETED LUMPECTOMY WITH RADIOACTIVE SEED LOCALIZATION;  Surgeon: Abigail Miyamoto, MD;  Location: [facility] OR;  Service: General;  Laterality: Right;   CARDIAC CATHETERIZATION  08/2011   CAST APPLICATION Right 09/06/2023   Procedure: CAST APPLICATION;  Surgeon: Tarry Kos, MD;  Location: [facility] OR;  Service: Orthopedics;  Laterality: Right;   CHOLECYSTECTOMY     COLONOSCOPY  EP Study  08/22/08   I & D EXTREMITY Right 09/06/2023   Procedure: IRRIGATION AND DEBRIDEMENT RIGHT ANKLE;  Surgeon: Tarry Kos, MD;  Location: [facility] OR;  Service: Orthopedics;  Laterality: Right;   IMPLANTABLE CARDIOVERTER DEFIBRILLATOR GENERATOR CHANGE N/A 08/22/2013   Procedure: IMPLANTABLE CARDIOVERTER DEFIBRILLATOR GENERATOR CHANGE;  Surgeon: Duke Salvia, MD;  Location: [facility] CATH LAB;  Service: Cardiovascular;  Laterality: N/A;   KNEE ARTHROSCOPY Left 04/14/2016   Procedure: LEFT KNEE ARTHROSCOPY, PARTIAL MEDIAL AND PARTIAL LATERAL MENISCECTOMY, MEDIAL  PLICA;  Surgeon: Jodi Geralds, MD;  Location: [facility] OR;  Service: Orthopedics;  Laterality: Left;   mastoid sugery     in the setting of Meniere's disease   ORIF ANKLE FRACTURE Right 09/09/2023   Procedure: OPEN REDUCTION INTERNAL FIXATION (ORIF) ANKLE FRACTURE;  Surgeon: [name], MD;  Location: [facility] OR;  Service: Orthopedics;  Laterality: Right;   TONSILLECTOMY AND ADENOIDECTOMY      reports that she has quit smoking. Her smoking use included cigarettes. She has never been exposed to tobacco smoke. She has never used smokeless tobacco. She reports that she does not drink alcohol and does not use drugs. 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    Social Determinants of Health   Financial Resource Strain: Low Risk  (12/08/2022)   Overall Financial Resource Strain (CARDIA)    Difficulty of Paying Living Expenses: Not hard at all  Food Insecurity: No Food Insecurity (09/13/2023)   Hunger Vital Sign    Worried About Running Out of Food in the Last Year: Never true    Ran Out of Food in the Last Year: Never true  Transportation Needs: No Transportation Needs (09/13/2023)   PRAPARE - Administrator, Civil Service (Medical): No    Lack of Transportation (Non-Medical): No  Physical Activity: Not on file   Stress: Not on file  Social Connections: Not on file  Intimate Partner Violence: Not At Risk (09/13/2023)   Humiliation, Afraid, Rape, and Kick questionnaire    Fear of Current or Ex-Partner: No    Emotionally Abused: No    Physically Abused: No    Sexually Abused: No    Functional Status Survey:    Family History  Problem Relation Age of Onset   Breast cancer Mother 41       met to lungs   Emphysema Father 25   Heart disease Father    Healthy Child    Lung cancer Cousin        maternal first cousin, she smoked   Colon cancer Neg Hx    Rectal cancer Neg Hx    Stomach cancer Neg Hx  Liver cancer Neg Hx    Esophageal cancer Neg Hx     Health Maintenance  Topic Date Due   FOOT EXAM  Never done   OPHTHALMOLOGY EXAM  Never done   Zoster Vaccines- Shingrix (2 of 2) 05/02/2023   INFLUENZA VACCINE  06/02/2023   COVID-19 Vaccine (6 - 2023-24 season) 07/03/2023   HEMOGLOBIN A1C  08/27/2023   Medicare Annual Wellness (AWV)  03/01/2024   DTaP/Tdap/Td (3 - Td or Tdap) 09/05/2033   Pneumonia Vaccine 50+ Years old  Completed   DEXA SCAN  Completed   HPV VACCINES  Aged Out    Allergies  Allergen Reactions   Coreg [Carvedilol] Other (See Comments)    Unknown reaction   Firvanq [Vancomycin] Itching and Other (See Comments)    Red Man Syndrome   Lactose Intolerance (Gi)    Lanoxin [Digoxin] Other (See Comments)    DIG toxicity   Sudafed [Pseudoephedrine] Other (See Comments)    Makes skin feel like its crawling     Outpatient Encounter Medications as of 09/15/2023  Medication Sig   acetaminophen (TYLENOL) 500 MG tablet Take 500 mg by mouth every 8 (eight) hours as needed for headache.   alendronate (FOSAMAX) 70 MG tablet Take 1 tablet (70 mg total) by mouth every 7 (seven) days. Take with a full glass of water on an empty stomach. (Patient taking differently: Take 70 mg by mouth every Sunday.)   aspirin EC 81 MG tablet Take 81 mg by mouth daily.   atorvastatin  (LIPITOR) 40 MG tablet Take 40 mg by mouth daily.   Continuous Glucose Receiver (DEXCOM G6 RECEIVER) DEVI 1 Device by Does not apply route as directed.   Continuous Glucose Sensor (DEXCOM G6 SENSOR) MISC 1 Device by Does not apply route as directed.   Continuous Glucose Transmitter (DEXCOM G6 TRANSMITTER) MISC 1 Device by Does not apply route as directed.   dicyclomine (BENTYL) 10 MG capsule TAKE 1 CAPSULE BY MOUTH THREE TIMES DAILY 30 MINUTES BEFORE MEALS   furosemide (LASIX) 20 MG tablet Take 1 tablet (20 mg total) by mouth daily. alternating (40 mg) every other day. (Patient taking differently: Take 20 mg by mouth daily.)   insulin glargine, 2 Unit Dial, (TOUJEO MAX SOLOSTAR) 300 UNIT/ML Solostar Pen Inject 30 Units into the skin daily.   metoprolol succinate (TOPROL-XL) 100 MG 24 hr tablet Take 100 mg by mouth 2 (two) times daily.   olmesartan (BENICAR) 40 MG tablet Take 40 mg by mouth daily.   oxyCODONE (OXY IR/ROXICODONE) 5 MG immediate release tablet Take 1 tablet (5 mg total) by mouth every 4 (four) hours as needed for moderate pain (pain score 4-6) (pain score 4-6).   polyethylene glycol (MIRALAX / GLYCOLAX) 17 g packet Take 17 g by mouth daily as needed for mild constipation.   potassium chloride (K-DUR) 10 MEQ tablet Take 10 mEq by mouth 2 (two) times daily.   No facility-administered encounter medications on file as of 09/15/2023.    Review of Systems  Constitutional:  Negative for chills and fever.  HENT:  Negative for sinus pressure and sore throat.   Respiratory:  Negative for cough, shortness of breath and wheezing.   Cardiovascular:  Negative for chest pain, palpitations and leg swelling.  Gastrointestinal:  Negative for abdominal distention, abdominal pain, blood in stool, constipation, diarrhea, nausea and vomiting.  Genitourinary:  Negative for dysuria, frequency and urgency.  Musculoskeletal:        Rt ankle intermittent pain  Neurological:  Negative for dizziness,  weakness and numbness.  Psychiatric/Behavioral:  Negative for confusion.     There were no vitals filed for this visit. There is no height or weight on file to calculate BMI. Physical Exam Constitutional:      Appearance: Normal appearance.  HENT:     Head: Normocephalic.  Cardiovascular:     Rate and Rhythm: Normal rate and regular rhythm.  Pulmonary:     Effort: Pulmonary effort is normal. No respiratory distress.     Breath sounds: Normal breath sounds. No wheezing.  Abdominal:     General: Bowel sounds are normal. There is no distension.     Tenderness: There is no abdominal tenderness. There is no guarding or rebound.  Musculoskeletal:     Comments: Rt lower extremity wrapped with ACE wrap with wound vac in place No drainage noted  Pt is able to wiggle her toes   Neurological:     Mental Status: She is alert. Mental status is at baseline.     Sensory: No sensory deficit.     Motor: No weakness.     Labs reviewed: Basic Metabolic Panel: Recent Labs    09/08/23 0615 09/09/23 0559 09/10/23 0530  NA 140 141 138  K 3.3* 4.3 3.8  CL 106 108 102  CO2 24 25 25   GLUCOSE 199* 143* 181*  BUN 25* 23 22  CREATININE 0.93 0.94 0.92  CALCIUM 8.5* 8.7* 8.3*  MG 2.0 2.1 2.2   Liver Function Tests: Recent Labs    12/08/22 0801 02/25/23 0000  AST 19 16  ALT 13 10  ALKPHOS 67 62  BILITOT 0.8  --   PROT 6.6  --   ALBUMIN 4.0 3.8   No results for input(s): "LIPASE", "AMYLASE" in the last 8760 hours. No results for input(s): "AMMONIA" in the last 8760 hours. CBC: Recent Labs    12/08/22 0801 12/31/22 1430 02/25/23 0000 09/06/23 2148 09/08/23 0615 09/09/23 0559 09/10/23 0530  WBC 5.9   < > 5.3   < > 12.5* 11.9* 8.1  NEUTROABS 4.4  --  3,546.00  --   --   --   --   HGB 14.5   < > 12.9   < > 11.3* 11.6* 10.5*  HCT 42.2   < > 38   < > 33.4* 34.9* 31.9*  MCV 93.6   < >  --    < > 95.4 97.5 95.8  PLT 153   < > 179   < > 137* 136* 119*   < > = values in this  interval not displayed.   Cardiac Enzymes: No results for input(s): "CKTOTAL", "CKMB", "CKMBINDEX", "TROPONINI" in the last 8760 hours. BNP: Invalid input(s): "POCBNP" Lab Results  Component Value Date   HGBA1C 7.5 02/25/2023   Lab Results  Component Value Date   TSH 1.93 02/25/2023   Lab Results  Component Value Date   VITAMINB12 414 08/22/2023   Lab Results  Component Value Date   FOLATE 19.7 03/02/2022   No results found for: "IRON", "TIBC", "FERRITIN"  Imaging and Procedures obtained prior to SNF admission: DG Tibia/Fibula Right  Result Date: 09/06/2023 CLINICAL DATA:  Fall EXAM: RIGHT TIBIA AND FIBULA - 2 VIEW COMPARISON:  None Available. FINDINGS: Oblique fracture through the distal right fibular metaphysis. Widening of the medial ankle mortise. No visible tibial fracture. Chondrocalcinosis and degenerative changes in the knee joint. No joint effusion. IMPRESSION: Distal fibular metaphyseal fracture with widening of the ankle mortise.  Electronically Signed   By: Charlett Nose M.D.   On: 09/06/2023 23:05   DG Shoulder Right  Result Date: 09/06/2023 CLINICAL DATA:  Fall EXAM: RIGHT SHOULDER - 2+ VIEW COMPARISON:  None Available. FINDINGS: Degenerative changes in the [facility] joint with joint space narrowing and spurring. Glenohumeral joint is maintained. No acute bony abnormality. Specifically, no fracture, subluxation, or dislocation. Soft tissues are intact. IMPRESSION: Degenerative changes in the [facility] joint.  No acute bony abnormality. Electronically Signed   By: Charlett Nose M.D.   On: 09/06/2023 23:04   DG Forearm Right  Result Date: 09/06/2023 CLINICAL DATA:  Fall EXAM: RIGHT FOREARM - 2 VIEW COMPARISON:  None Available. FINDINGS: No acute bony abnormality. Specifically, no fracture, subluxation, or dislocation. Chondrocalcinosis in the triangular fibrocartilage. Soft tissues intact. IMPRESSION: No acute bony abnormality. Electronically Signed   By: Charlett Nose M.D.   On:  09/06/2023 23:04   DG Elbow Complete Right  Result Date: 09/06/2023 CLINICAL DATA:  Fall EXAM: RIGHT ELBOW - COMPLETE 3+ VIEW COMPARISON:  None Available. FINDINGS: No acute bony abnormality. Specifically, no fracture, subluxation, or dislocation. No joint effusion. Soft tissues are intact. IMPRESSION: No acute bony abnormality. Electronically Signed   By: Charlett Nose M.D.   On: 09/06/2023 23:03   DG Ankle Right Port  Result Date: 09/06/2023 CLINICAL DATA:  Fall, ankle pain EXAM: PORTABLE RIGHT ANKLE - 2 VIEW COMPARISON:  None Available. FINDINGS: Oblique fracture in the distal right fibular metaphysis. No visible tibial fracture. There is widening of the medial ankle mortise. IMPRESSION: Distal fibular fracture with widening of the medial ankle mortise. Electronically Signed   By: Charlett Nose M.D.   On: 09/06/2023 23:01    Assessment/Plan      Open fracture of right distal fibula Pt had  I&D ON 11/6 and ORIF on  11/8  Wound vac in place WBAT  Rt Lower extremity  Need to follow up with Dr Lajoyce Corners in 1 week  Cont with PT/ OT  Cont with tylenol q8 prn, oxycodone q8 prn    Osteoporosis Cont with fosamax, cal, vit d   DM  Lab Results  Component Value Date   HGBA1C 7.5 02/25/2023   HGBA1C 7.5 (H) 12/31/2022   HGBA1C 7.4 (H) 11/10/2020   Cont with toujeo 30 units  Monitor BG  Monitor for hypoglycemia   HTN  Bp high today  Will monitor daily and will adjust accordingly  Cont with metoprolol, olmesartan  Chronic systolic  heart failure  Lungs clear  Cont with lasix  20 mg alternating with 40 mg  Cont with potassium supplements   Irritable bowel syndrome Will d/c dicyclomine Will monitor  Family/ staff Communication: care plan discussed with the nursing staff  I spent 38 minutes total time for the care of this patient in face to face time, chart review from recent hospitalization on 11/5 ,clinical documentation, patient education, coordinating care with nursing staff .

## 2023-09-16 ENCOUNTER — Ambulatory Visit (INDEPENDENT_AMBULATORY_CARE_PROVIDER_SITE_OTHER): Payer: Medicare Other | Admitting: Orthopedic Surgery

## 2023-09-16 DIAGNOSIS — S82891C Other fracture of right lower leg, initial encounter for open fracture type IIIA, IIIB, or IIIC: Secondary | ICD-10-CM | POA: Diagnosis not present

## 2023-09-19 NOTE — Progress Notes (Signed)
Remote pacemaker transmission.   

## 2023-09-20 ENCOUNTER — Encounter: Payer: Self-pay | Admitting: Orthopedic Surgery

## 2023-09-20 NOTE — Progress Notes (Addendum)
Office Visit Note   Patient: Grace Robinson           Date of Birth: 02/21/1926           MRN: [id number] Visit Date: 09/16/2023              Requested by: Mast, Man X, NP [address] [place],  [place] [address] PCP: Mast, Man X, NP  Chief Complaint  Patient presents with   Right Ankle - Routine Post Op    09/09/2023 ORIF right ankle fracture       HPI: Patient is a 89 year old woman who is status post open duction internal fixation right fibular fracture and syndesmosis injury with a fracture dislocation of her ankle.  Patient has been in an axial form wound VAC dressing.  Assessment & Plan: Visit Diagnoses:  1. Closed right ankle fracture, initial encounter     Plan: Patient will proceed with Dial soap cleansing dry dressing changes with protein supplement and compression with elevation.  She may be weightbearing as required in the fracture boot.  Three-view radiographs of the right ankle at follow-up.  Follow-Up Instructions: Return in about 2 weeks (around 09/30/2023).   Ortho Exam  Patient is alert, oriented, no adenopathy, well-dressed, normal affect, normal respiratory effort. Examination the traumatic medial skin flap is well-healed from her open fracture.  The lateral incision is well-approximated.  Imaging: No results found. No images are attached to the encounter.  Labs: Lab Results  Component Value Date   HGBA1C 7.5 02/25/2023   HGBA1C 7.5 (H) 12/31/2022   HGBA1C 7.4 (H) 11/10/2020   ESRSEDRATE 48 (H) 09/06/2016   CRP 0.1 (L) 09/06/2016     Lab Results  Component Value Date   ALBUMIN 3.8 02/25/2023   ALBUMIN 4.0 12/08/2022   ALBUMIN 4.7 (H) 11/10/2020    Lab Results  Component Value Date   MG 2.2 09/10/2023   MG 2.1 09/09/2023   MG 2.0 09/08/2023   Lab Results  Component Value Date   VD25OH 41 02/25/2023    No results found for: "PREALBUMIN"    Latest Ref Rng & Units 09/10/2023    5:30 AM 09/09/2023    5:59 AM 09/08/2023    6:15  AM  CBC EXTENDED  WBC 4.0 - 10.5 K/uL 8.1  11.9  12.5   RBC 3.87 - 5.11 MIL/uL 3.33  3.58  3.50   Hemoglobin 12.0 - 15.0 g/dL 19.1  47.8  29.5   HCT 36.0 - 46.0 % 31.9  34.9  33.4   Platelets 150 - 400 K/uL 119  136  137      There is no height or weight on file to calculate BMI.  Orders:  No orders of the defined types were placed in this encounter.  No orders of the defined types were placed in this encounter.    Procedures: No procedures performed  Clinical Data: No additional findings.  ROS:  All other systems negative, except as noted in the HPI. Review of Systems  Objective: Vital Signs: There were no vitals taken for this visit.  Specialty Comments:  No specialty comments available.  PMFS History: Patient Active Problem List   Diagnosis Date Noted   IBS (irritable bowel syndrome) 09/10/2023   Open trimalleolar fracture of ankle, right, type III, initial encounter 09/09/2023   Hypokalemia 09/08/2023   Dislocation of ankle, right, open, initial encounter 09/07/2023   Open fracture of right distal fibula 09/06/2023   COVID-19 virus infection 06/09/2023  Gait abnormality 05/19/2023   Osteoarthritis 05/03/2023   Radiation dermatitis 05/03/2023   Osteopenia after menopause 03/04/2023   Closed pelvic fracture (HCC) 02/23/2023   Lower back pain 02/23/2023   Hydronephrosis 02/23/2023   Sigmoid diverticulitis 02/23/2023   Aortic atherosclerosis (HCC) 02/23/2023   Hyperlipidemia associated with type 2 diabetes mellitus (HCC) 02/23/2023   Vitamin B12 deficiency 02/23/2023   Vitamin D deficiency 02/23/2023   Type 2 diabetes mellitus with renal complication (HCC) 02/23/2023   S/P lumpectomy, right breast 01/05/2023   Ductal carcinoma in situ (DCIS) of right breast 12/06/2022   NICM (nonischemic cardiomyopathy) (HCC) 07/30/2021   Degenerative tear of posterior horn of medial meniscus of left knee 04/14/2016   Tear of lateral meniscus of left knee 04/14/2016    Synovial plica of left knee 04/14/2016   Mechanical complication of implantable cardioverter-defibrillator (ICD) 01/17/2014   Fall 07/10/2013   Paroxysmal ventricular tachycardia (HCC) 12/13/2012   Biventricular implantable cardioverter-defibrillator -[employer]tronic 12/08/2011   Atrial tachycardia (HCC) 12/08/2011   HTN (hypertension) 05/19/2011   CARDIOMYOPATHY, PRIMARY, DILATED 12/01/2010   SYSTOLIC HEART FAILURE, CHRONIC 12/01/2010   Past Medical History:  Diagnosis Date   Biventricular ICD (implantable cardiac defibrillator) in place    downgraded from CRT-D to CRT-P 08/30/22   Breast cancer (HCC)    Cellulitis    Change in bowel habits    CHF (congestive heart failure) (HCC)    Colon polyps    Diabetes mellitus    Type II   Diarrhea    Dyslipidemia    HOH (hard of hearing)    left   HTN (hypertension)    LBBB (left bundle branch block)    chronic   Meniere's disease    Nonischemic cardiomyopathy (HCC) 2006   Presence of permanent cardiac pacemaker    T wave oversensing 01/17/2014   Ulcerative colitis    Varicose veins    4 lazer  treatment each leg    Family History  Problem Relation Age of Onset   Breast cancer Mother 66       met to lungs   Emphysema Father 18   Heart disease Father    Healthy Child    Lung cancer Cousin        maternal first cousin, she smoked   Colon cancer Neg Hx    Rectal cancer Neg Hx    Stomach cancer Neg Hx    Liver cancer Neg Hx    Esophageal cancer Neg Hx     Past Surgical History:  Procedure Laterality Date   ABDOMINAL HYSTERECTOMY     BIV PACEMAKER GENERATOR CHANGEOUT N/A 08/30/2022   Procedure: BIV PACEMAKER GENERATOR CHANGEOUT;  Surgeon: Duke Salvia, MD;  Location: [facility] INVASIVE CV LAB;  Service: Cardiovascular;  Laterality: N/A;   BREAST LUMPECTOMY WITH RADIOACTIVE SEED LOCALIZATION Right 01/05/2023   Procedure: RIGHT BREAST BRACKETED LUMPECTOMY WITH RADIOACTIVE SEED LOCALIZATION;  Surgeon: Abigail Miyamoto, MD;  Location: [facility]  OR;  Service: General;  Laterality: Right;   CARDIAC CATHETERIZATION  08/2011   CAST APPLICATION Right 09/06/2023   Procedure: CAST APPLICATION;  Surgeon: Tarry Kos, MD;  Location: [facility] OR;  Service: Orthopedics;  Laterality: Right;   CHOLECYSTECTOMY     COLONOSCOPY     EP Study  08/22/08   I & D EXTREMITY Right 09/06/2023   Procedure: IRRIGATION AND DEBRIDEMENT RIGHT ANKLE;  Surgeon: Tarry Kos, MD;  Location: [facility] OR;  Service: Orthopedics;  Laterality: Right;   IMPLANTABLE CARDIOVERTER DEFIBRILLATOR GENERATOR CHANGE N/A  08/22/2013   Procedure: IMPLANTABLE CARDIOVERTER DEFIBRILLATOR GENERATOR CHANGE;  Surgeon: Duke Salvia, MD;  Location: [facility] CATH LAB;  Service: Cardiovascular;  Laterality: N/A;   KNEE ARTHROSCOPY Left 04/14/2016   Procedure: LEFT KNEE ARTHROSCOPY, PARTIAL MEDIAL AND PARTIAL LATERAL MENISCECTOMY, MEDIAL PLICA;  Surgeon: Jodi Geralds, MD;  Location: [facility] OR;  Service: Orthopedics;  Laterality: Left;   mastoid sugery     in the setting of Meniere's disease   ORIF ANKLE FRACTURE Right 09/09/2023   Procedure: OPEN REDUCTION INTERNAL FIXATION (ORIF) ANKLE FRACTURE;  Surgeon: [name], MD;  Location: [facility] OR;  Service: Orthopedics;  Laterality: Right;   TONSILLECTOMY AND ADENOIDECTOMY     Social History   Occupational History   Occupation: retired    Comment: Runner, 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

## 2023-09-21 ENCOUNTER — Encounter: Payer: Medicare Other | Admitting: Family

## 2023-09-22 ENCOUNTER — Encounter: Payer: Medicare Other | Admitting: Nurse Practitioner

## 2023-09-26 ENCOUNTER — Non-Acute Institutional Stay (SKILLED_NURSING_FACILITY): Payer: Self-pay | Admitting: Nurse Practitioner

## 2023-09-26 ENCOUNTER — Encounter: Payer: Self-pay | Admitting: Nurse Practitioner

## 2023-09-26 DIAGNOSIS — L03115 Cellulitis of right lower limb: Secondary | ICD-10-CM | POA: Diagnosis not present

## 2023-09-26 DIAGNOSIS — Z794 Long term (current) use of insulin: Secondary | ICD-10-CM

## 2023-09-26 DIAGNOSIS — M8580 Other specified disorders of bone density and structure, unspecified site: Secondary | ICD-10-CM

## 2023-09-26 DIAGNOSIS — Z780 Asymptomatic menopausal state: Secondary | ICD-10-CM

## 2023-09-26 DIAGNOSIS — I10 Essential (primary) hypertension: Secondary | ICD-10-CM | POA: Diagnosis not present

## 2023-09-26 DIAGNOSIS — I5022 Chronic systolic (congestive) heart failure: Secondary | ICD-10-CM

## 2023-09-26 DIAGNOSIS — N183 Chronic kidney disease, stage 3 unspecified: Secondary | ICD-10-CM

## 2023-09-26 DIAGNOSIS — E1122 Type 2 diabetes mellitus with diabetic chronic kidney disease: Secondary | ICD-10-CM | POA: Diagnosis not present

## 2023-09-26 DIAGNOSIS — L0390 Cellulitis, unspecified: Secondary | ICD-10-CM | POA: Insufficient documentation

## 2023-09-26 NOTE — Assessment & Plan Note (Signed)
redness, warmth, mild swelling R lower leg, sutures intact, f/u Ortho 09/27/23  Hospitalized 09/06/23-09/14/23 for open fracture of the right distal fibula, trimalleolar. S/p ORIF  Doxycycline 100mg  bid x 7 days.

## 2023-09-26 NOTE — Assessment & Plan Note (Signed)
Compensated, mild swelling RLE, taking Furosemide, Bun/creat 22/0.92 09/10/23

## 2023-09-26 NOTE — Assessment & Plan Note (Signed)
T2DM/renal complication, taking insulin Toujeo 30 u sq daily, am CBG 281-569, Hgb A1c 7.5 02/25/23 Increase Toujeo 33u sq every day, CBG ac meals, Insulin Aspart 5u if CBG>200, update Hgb A1c, CBC/diff, CMP/eGFR

## 2023-09-26 NOTE — Progress Notes (Signed)
Location:   SNF [facility] Room Number: 70 Place of Service:  SNF (31) Provider: Arna Snipe Mayco Walrond NP  Verlie Liotta X, NP  Patient Care Team: Edynn Gillock X, NP as PCP - General (Internal Medicine) Abigail Miyamoto, MD as Consulting Physician (General Surgery) Rachel Moulds, MD as Consulting Physician (Hematology and Oncology) Dorothy Puffer, MD as Consulting Physician (Radiation Oncology)  Extended Emergency Contact Information Primary Emergency Contact: [name] Address: [address]          [place], [place] [address] [place] Home Phone: [phone] Mobile Phone: [phone] Relation: Son  Code Status: DNR Goals of care: Advanced Directive information    09/14/2023   10:25 AM  Advanced Directives  Would patient like information on creating a medical advance directive? No - Patient declined     Chief Complaint  Patient presents with   Acute Visit    R ankle surgical sites mild redness, elevated CBGs    HPI:  Pt is a 89 y.o. female seen today for an acute visit for redness, warmth, mild swelling R lower leg, sutures intact, f/u Ortho 09/27/23  Hospitalized 09/06/23-09/14/23 for open fracture of the right distal fibula, trimalleolar. S/p ORIF   Healed multiple fractures sustained from fall 02/18/23: pelvic fractures: nondisplaced fxs of R superior and inferior pubic rami, minimally displaced fx of the inferior R sacral ala extended to the R SIJ. Mild compression fx of superior endplates of T12, L2. Prn Tylenol  Fall, mechanical T2DM/renal complication, taking insulin Toujeo 30 u sq daily, am CBG 281-569, Hgb A1c 7.5 02/25/23 Mild bilateral hydronephrosis, no stones Sigmoid diverticulitis/IBS taking Bentyl, Imodium Aortic atherosclerosis, taking ASA, Atorvastatin HLD, taking Atorvastatin, LDL 58 02/24/23 Vit B12 deficiency, takes Vit B12, Hgb 12.9 02/24/23 Vit D deficiency, on Vit D, Vit D 41 02/24/23 CHF, taking Furosemide, Bun/creat 22/0.92  09/10/23 HTN, taking Metoprolol, Olmesartan OP 03/03/23 DEXA t score -1.236, on Ca, Vit D, recommend Fosamax 70mg  qwk              S/p R breat cancer lumpectomy, f/u surgeon, 03/15/23 cancer center radiation tx             Radiation dermatitis right upper chest/breast, healed.        Past Medical History:  Diagnosis Date   Biventricular ICD (implantable cardiac defibrillator) in place    downgraded from CRT-D to CRT-P 08/30/22   Breast cancer (HCC)    Cellulitis    Change in bowel habits    CHF (congestive heart failure) (HCC)    Colon polyps    Diabetes mellitus    Type II   Diarrhea    Dyslipidemia    HOH (hard of hearing)    left   HTN (hypertension)    LBBB (left bundle branch block)    chronic   Meniere's disease    Nonischemic cardiomyopathy (HCC) 2006   Presence of permanent cardiac pacemaker    T wave oversensing 01/17/2014   Ulcerative colitis    Varicose veins    4 lazer  treatment each leg   Past Surgical History:  Procedure Laterality Date   ABDOMINAL HYSTERECTOMY     BIV PACEMAKER GENERATOR CHANGEOUT N/A 08/30/2022   Procedure: BIV PACEMAKER GENERATOR CHANGEOUT;  Surgeon: Duke Salvia, MD;  Location: [facility] INVASIVE CV LAB;  Service: Cardiovascular;  Laterality: N/A;   BREAST LUMPECTOMY WITH RADIOACTIVE SEED LOCALIZATION Right 01/05/2023   Procedure: RIGHT BREAST BRACKETED LUMPECTOMY WITH RADIOACTIVE SEED LOCALIZATION;  Surgeon: Abigail Miyamoto,  MD;  Location: [facility] OR;  Service: General;  Laterality: Right;   CARDIAC CATHETERIZATION  08/2011   CAST APPLICATION Right 09/06/2023   Procedure: CAST APPLICATION;  Surgeon: Tarry Kos, MD;  Location: [facility] OR;  Service: Orthopedics;  Laterality: Right;   CHOLECYSTECTOMY     COLONOSCOPY     EP Study  08/22/08   I & D EXTREMITY Right 09/06/2023   Procedure: IRRIGATION AND DEBRIDEMENT RIGHT ANKLE;  Surgeon: Tarry Kos, MD;  Location: [facility] OR;  Service: Orthopedics;  Laterality: Right;   IMPLANTABLE CARDIOVERTER  DEFIBRILLATOR GENERATOR CHANGE N/A 08/22/2013   Procedure: IMPLANTABLE CARDIOVERTER DEFIBRILLATOR GENERATOR CHANGE;  Surgeon: Duke Salvia, MD;  Location: [facility] CATH LAB;  Service: Cardiovascular;  Laterality: N/A;   KNEE ARTHROSCOPY Left 04/14/2016   Procedure: LEFT KNEE ARTHROSCOPY, PARTIAL MEDIAL AND PARTIAL LATERAL MENISCECTOMY, MEDIAL PLICA;  Surgeon: Jodi Geralds, MD;  Location: [facility] OR;  Service: Orthopedics;  Laterality: Left;   mastoid sugery     in the setting of Meniere's disease   ORIF ANKLE FRACTURE Right 09/09/2023   Procedure: OPEN REDUCTION INTERNAL FIXATION (ORIF) ANKLE FRACTURE;  Surgeon: [name], MD;  Location: [facility] OR;  Service: Orthopedics;  Laterality: Right;   TONSILLECTOMY AND ADENOIDECTOMY      Allergies  Allergen Reactions   Coreg [Carvedilol] Other (See Comments)    Unknown reaction   Firvanq [Vancomycin] Itching and Other (See Comments)    Red [place]ilia [place] Syndrome   Lactose Intolerance (Gi)    Lanoxin [Digoxin] Other (See Comments)    DIG toxicity   Sudafed [Pseudoephedrine] Other (See Comments)    Makes skin feel like its crawling     Allergies as of 09/26/2023       Reactions   Coreg [carvedilol] Other (See Comments)   Unknown reaction   Firvanq [vancomycin] Itching, Other (See Comments)   Red Nakima Fluegge Syndrome   Lactose Intolerance (gi)    Lanoxin [digoxin] Other (See Comments)   DIG toxicity   Sudafed [pseudoephedrine] Other (See Comments)   Makes skin feel like its crawling        Medication List        Accurate as of September 26, 2023  2:09 PM. If you have any questions, ask your nurse or doctor.          acetaminophen 500 MG tablet Commonly known as: TYLENOL Take 500 mg by mouth every 8 (eight) hours as needed for headache.   alendronate 70 MG tablet Commonly known as: FOSAMAX Take 1 tablet (70 mg total) by mouth every 7 (seven) days. Take with a full glass of water on an empty stomach. What changed:  when to take this additional  instructions   aspirin EC 81 MG tablet Take 81 mg by mouth daily.   atorvastatin 40 MG tablet Commonly known as: LIPITOR Take 40 mg by mouth daily.   Dexcom G6 Receiver Devi 1 Device by Does not apply route as directed.   Dexcom G6 Sensor Misc 1 Device by Does not apply route as directed.   Dexcom G6 Transmitter Misc 1 Device by Does not apply route as directed.   dicyclomine 10 MG capsule Commonly known as: BENTYL TAKE 1 CAPSULE BY MOUTH THREE TIMES DAILY 30 MINUTES BEFORE MEALS   furosemide 20 MG tablet Commonly known as: LASIX Take 1 tablet (20 mg total) by mouth daily. alternating (40 mg) every other day. What changed: additional instructions   metoprolol succinate 100 MG 24 hr tablet Commonly known as:  TOPROL-XL Take 100 mg by mouth 2 (two) times daily.   olmesartan 40 MG tablet Commonly known as: BENICAR Take 40 mg by mouth daily.   oxyCODONE 5 MG immediate release tablet Commonly known as: Oxy IR/ROXICODONE Take 1 tablet (5 mg total) by mouth every 4 (four) hours as needed for moderate pain (pain score 4-6) (pain score 4-6).   polyethylene glycol 17 g packet Commonly known as: MIRALAX / GLYCOLAX Take 17 g by mouth daily as needed for mild constipation.   potassium chloride 10 MEQ tablet Commonly known as: KLOR-CON M Take 10 mEq by mouth 2 (two) times daily.   Toujeo Max SoloStar 300 UNIT/ML Solostar Pen Generic drug: insulin glargine (2 Unit Dial) Inject 30 Units into the skin daily.        Review of Systems  Constitutional:  Negative for appetite change, fatigue and fever.  HENT:  Positive for hearing loss. Negative for congestion and trouble swallowing.   Eyes:  Negative for visual disturbance.  Respiratory:  Negative for cough, chest tightness and wheezing.        Residual hacking cough   Cardiovascular:  Positive for leg swelling.  Gastrointestinal:  Negative for abdominal pain, constipation and diarrhea.  Genitourinary:  Negative for dysuria  and urgency.  Musculoskeletal:  Positive for arthralgias, back pain, gait problem and joint swelling.       Right lower leg pain, in long boot  Skin:  Positive for color change and wound.  Neurological:  Negative for tremors and headaches.  Psychiatric/Behavioral:  Negative for behavioral problems and sleep disturbance. The patient is not nervous/anxious.     Immunization History  Administered Date(s) Administered   Influenza, High Dose Seasonal PF 10/06/2016, 10/05/2017, 09/14/2018, 08/17/2022   Influenza,inj,Quad PF,6+ Mos 10/02/2014   PNEUMOCOCCAL CONJUGATE-20 03/16/2023   PPD Test 02/22/2023   Pneumococcal-Unspecified 03/29/2022   Tdap 09/29/2022, 09/06/2023   Unspecified SARS-COV-2 Vaccination 11/05/2019, 12/03/2019, 03/31/2021, 07/21/2021, 03/19/2022   Zoster Recombinant(Shingrix) 03/07/2023   Pertinent  Health Maintenance Due  Topic Date Due   FOOT EXAM  Never done   OPHTHALMOLOGY EXAM  Never done   INFLUENZA VACCINE  06/02/2023   HEMOGLOBIN A1C  08/27/2023   DEXA SCAN  Completed      05/19/2023    2:37 PM 06/02/2023    2:49 PM 06/09/2023    8:48 AM 07/08/2023   10:35 AM 07/29/2023    9:55 AM  Fall Risk  Falls in the past year? 1 1 1 1  0  Was there an injury with Fall? 1 1 1 1  0  Fall Risk Category Calculator 3 3 3 3  0  Patient at Risk for Falls Due to  Impaired balance/gait;History of fall(s) History of fall(s);Impaired balance/gait History of fall(s);Impaired balance/gait;Impaired mobility History of fall(s);Impaired balance/gait;Impaired mobility  Fall risk Follow up Falls evaluation completed Falls evaluation completed Falls evaluation completed Falls evaluation completed Falls evaluation completed   Functional Status Survey:    Vitals:   09/26/23 1350  BP: (!) 140/62  Pulse: 70  Resp: 18  Temp: (!) 97.4 F (36.3 C)  SpO2: 97%  Weight: 129 lb 8 oz (58.7 kg)   Body mass index is 20.9 kg/m. Physical Exam Vitals and nursing note reviewed.  Constitutional:       Appearance: Normal appearance.  HENT:     Head: Normocephalic and atraumatic.     Nose: Nose normal.     Mouth/Throat:     Mouth: Mucous membranes are moist.  Eyes:  Extraocular Movements: Extraocular movements intact.     Conjunctiva/sclera: Conjunctivae normal.     Pupils: Pupils are equal, round, and reactive to light.  Cardiovascular:     Rate and Rhythm: Normal rate and regular rhythm.     Heart sounds: No murmur heard.    Comments: Pacemaker left upper chest(defibrillator was removed per patient's request in the past) Pulmonary:     Effort: Pulmonary effort is normal.     Breath sounds: No rales.  Abdominal:     General: Bowel sounds are normal.     Palpations: Abdomen is soft.     Tenderness: There is no abdominal tenderness.  Musculoskeletal:        General: Tenderness present.     Cervical back: Normal range of motion and neck supple.     Right lower leg: Edema present.     Left lower leg: No edema.     Comments: Lower back, pelvic/thigh pain with movement/weight bearing.  Mild edema RLE  Skin:    General: Skin is warm and dry.     Findings: Erythema present.     Comments: S/p R breat cancer lumpectomy, no apparent edema R arm.   Radiation dermatitis right upper chest/breast, healed.  R lower leg sutures intact, noted redness, warmth, swelling, medial and lateral aspect around sutures.    Neurological:     General: No focal deficit present.     Mental Status: She is alert and oriented to person, place, and time. Mental status is at baseline.     Motor: No weakness.     Gait: Gait abnormal.  Psychiatric:        Mood and Affect: Mood normal.        Behavior: Behavior normal.        Thought Content: Thought content normal.     Labs reviewed: Recent Labs    09/08/23 0615 09/09/23 0559 09/10/23 0530  NA 140 141 138  K 3.3* 4.3 3.8  CL 106 108 102  CO2 24 25 25   GLUCOSE 199* 143* 181*  BUN 25* 23 22  CREATININE 0.93 0.94 0.92  CALCIUM 8.5* 8.7*  8.3*  MG 2.0 2.1 2.2   Recent Labs    12/08/22 0801 02/25/23 0000  AST 19 16  ALT 13 10  ALKPHOS 67 62  BILITOT 0.8  --   PROT 6.6  --   ALBUMIN 4.0 3.8   Recent Labs    12/08/22 0801 12/31/22 1430 02/25/23 0000 09/06/23 2148 09/08/23 0615 09/09/23 0559 09/10/23 0530  WBC 5.9   < > 5.3   < > 12.5* 11.9* 8.1  NEUTROABS 4.4  --  3,546.00  --   --   --   --   HGB 14.5   < > 12.9   < > 11.3* 11.6* 10.5*  HCT 42.2   < > 38   < > 33.4* 34.9* 31.9*  MCV 93.6   < >  --    < > 95.4 97.5 95.8  PLT 153   < > 179   < > 137* 136* 119*   < > = values in this interval not displayed.   Lab Results  Component Value Date   TSH 1.93 02/25/2023   Lab Results  Component Value Date   HGBA1C 7.5 02/25/2023   Lab Results  Component Value Date   CHOL 116 02/25/2023   HDL 35 02/25/2023   LDLCALC 58 02/25/2023   TRIG 144 02/25/2023   CHOLHDL 2.7 11/10/2020  Significant Diagnostic Results in last 30 days:  DG MINI C-ARM IMAGE ONLY  Result Date: 09/09/2023 There is no interpretation for this exam.  This order is for images obtained during a surgical procedure.  Please See "Surgeries" Tab for more information regarding the procedure.   DG Tibia/Fibula Right  Result Date: 09/06/2023 CLINICAL DATA:  Fall EXAM: RIGHT TIBIA AND FIBULA - 2 VIEW COMPARISON:  None Available. FINDINGS: Oblique fracture through the distal right fibular metaphysis. Widening of the medial ankle mortise. No visible tibial fracture. Chondrocalcinosis and degenerative changes in the knee joint. No joint effusion. IMPRESSION: Distal fibular metaphyseal fracture with widening of the ankle mortise. Electronically Signed   By: Charlett Nose M.D.   On: 09/06/2023 23:05   DG Shoulder Right  Result Date: 09/06/2023 CLINICAL DATA:  Fall EXAM: RIGHT SHOULDER - 2+ VIEW COMPARISON:  None Available. FINDINGS: Degenerative changes in the [facility] joint with joint space narrowing and spurring. Glenohumeral joint is maintained. No acute  bony abnormality. Specifically, no fracture, subluxation, or dislocation. Soft tissues are intact. IMPRESSION: Degenerative changes in the [facility] joint.  No acute bony abnormality. Electronically Signed   By: Charlett Nose M.D.   On: 09/06/2023 23:04   DG Forearm Right  Result Date: 09/06/2023 CLINICAL DATA:  Fall EXAM: RIGHT FOREARM - 2 VIEW COMPARISON:  None Available. FINDINGS: No acute bony abnormality. Specifically, no fracture, subluxation, or dislocation. Chondrocalcinosis in the triangular fibrocartilage. Soft tissues intact. IMPRESSION: No acute bony abnormality. Electronically Signed   By: Charlett Nose M.D.   On: 09/06/2023 23:04   DG Elbow Complete Right  Result Date: 09/06/2023 CLINICAL DATA:  Fall EXAM: RIGHT ELBOW - COMPLETE 3+ VIEW COMPARISON:  None Available. FINDINGS: No acute bony abnormality. Specifically, no fracture, subluxation, or dislocation. No joint effusion. Soft tissues are intact. IMPRESSION: No acute bony abnormality. Electronically Signed   By: Charlett Nose M.D.   On: 09/06/2023 23:03   DG Ankle Right Port  Result Date: 09/06/2023 CLINICAL DATA:  Fall, ankle pain EXAM: PORTABLE RIGHT ANKLE - 2 VIEW COMPARISON:  None Available. FINDINGS: Oblique fracture in the distal right fibular metaphysis. No visible tibial fracture. There is widening of the medial ankle mortise. IMPRESSION: Distal fibular fracture with widening of the medial ankle mortise. Electronically Signed   By: Charlett Nose M.D.   On: 09/06/2023 23:01   CUP PACEART REMOTE DEVICE CHECK  Result Date: 08/31/2023 Scheduled remote reviewed. Normal device function.  Next remote 91 days. ML, CVRS   Assessment/Plan: Cellulitis redness, warmth, mild swelling R lower leg, sutures intact, f/u Ortho 09/27/23  Hospitalized 09/06/23-09/14/23 for open fracture of the right distal fibula, trimalleolar. S/p ORIF  Doxycycline 100mg  bid x 7 days.   Type 2 diabetes mellitus with renal complication (HCC) T2DM/renal  complication, taking insulin Toujeo 30 u sq daily, am CBG 281-569, Hgb A1c 7.5 02/25/23 Increase Toujeo 33u sq every day, CBG ac meals, Insulin Aspart 5u if CBG>200, update Hgb A1c, CBC/diff, CMP/eGFR  SYSTOLIC HEART FAILURE, CHRONIC Compensated, mild swelling RLE, taking Furosemide, Bun/creat 22/0.92 09/10/23  HTN (hypertension) Blood pressure is controlled,  taking Metoprolol, Olmesartan  Osteopenia after menopause OP 03/03/23 DEXA t score -1.236, on Ca, Vit D, recommend Fosamax 70mg  qwk     Family/ staff Communication: plan of care reviewed with the patient and charge nurse.   Labs/tests ordered: CBC/diff, CMP/eGFR, Hgb A1c in am.   Time spend 30 minutes.

## 2023-09-26 NOTE — Assessment & Plan Note (Signed)
OP 03/03/23 DEXA t score -1.236, on Ca, Vit D, recommend Fosamax 70mg  qwk

## 2023-09-26 NOTE — Assessment & Plan Note (Signed)
Blood pressure is controlled,  taking Metoprolol, Olmesartan

## 2023-09-27 ENCOUNTER — Ambulatory Visit: Payer: Medicare Other | Admitting: Orthopedic Surgery

## 2023-09-27 DIAGNOSIS — S82891C Other fracture of right lower leg, initial encounter for open fracture type IIIA, IIIB, or IIIC: Secondary | ICD-10-CM

## 2023-09-27 LAB — BASIC METABOLIC PANEL
BUN: 21 (ref 4–21)
CO2: 28 — AB (ref 13–22)
Chloride: 99 (ref 99–108)
Creatinine: 1 (ref 0.5–1.1)
Glucose: 393
Potassium: 4.1 meq/L (ref 3.5–5.1)
Sodium: 137 (ref 137–147)

## 2023-09-27 LAB — COMPREHENSIVE METABOLIC PANEL
Albumin: 3.3 — AB (ref 3.5–5.0)
Calcium: 8.6 — AB (ref 8.7–10.7)
Globulin: 2.4
eGFR: 55

## 2023-09-28 LAB — HEPATIC FUNCTION PANEL
ALT: 16 U/L (ref 7–35)
AST: 15 (ref 13–35)
Alkaline Phosphatase: 147 — AB (ref 25–125)
Bilirubin, Total: 0.6

## 2023-09-28 LAB — CBC: RBC: 3.44 — AB (ref 3.87–5.11)

## 2023-09-28 LAB — CBC AND DIFFERENTIAL
HCT: 34 — AB (ref 36–46)
Hemoglobin: 11 — AB (ref 12.0–16.0)
Neutrophils Absolute: 3244
Platelets: 138 10*3/uL — AB (ref 150–400)
WBC: 4.9

## 2023-09-28 NOTE — Progress Notes (Unsigned)
Cardiology Office Note Date:  09/28/2023  Patient ID:  Grace Robinson, Grace Robinson [birth year], MRN [id number] PCP:  Mast, Man X, NP  Cardiologist:  Dr. Antoine Poche L. Tyrone Sage, NP (retired) Electrophysiologist: Dr. Graciela Husbands    Chief Complaint:   *** 9 mo  History of Present Illness: Grace Robinson is a 89 y.o. female with history of NICM, VT, chronic CHF (systolic), DM, HTN, HLD, LBBB, CRT-D, ulcerative colitis, Meniere's disease, suspect TIA  Recovered LVEF by echo 2015  *** Resides at [facility]   She saw Dr. Graciela Husbands April 2023, looks like they discussed her device nearing ERI and all the potential options, at the time of her visit then, she preferred not to pursue any device change and when her life ends/heart stops, she was at peace with dying. He also discussed noted nystagmus and planned to reach out to Drs Tat and Timothy Lasso for their thoughts/?neuro eval.   I saw her 08/02/22 She is accompanied by her son. She is doing very well No CP, denies SOB, minimal if any DOE. No near syncope or syncope. No shocks She tells me she does NOT want a battery change She does not want programmiong changes today to turn off therapies, but des not want a new ICD when this one wears out. We discussed that her heart beat (noting underlying SR in the 60's with intact conduction) does not depend on her device but she is getting CRT pacing which may have impact on her QOL if we do not replace her device and she loses CRT. She woild like to therefor have a downgrade of her device to CRT-P, in the hopes that she will continue with the great QOL she is currently having. We revisited the procedure, potential risks, she would like to proceed SCAF noted, in d/w pt/son, planned to monitor burden (noting hx of TIA)  She saw Dr. Graciela Husbands 12/07/22, recently found with breast cancer, pending  multidisciplinary team meeting and management plan SCAF, no VT  March 2024 > lumpectomy May-June 2024 > XRT  Admitted 09/06/23,  mechanical fall (leg gave way) resulting in open dital fib fracture taken to the OR for I&D of the open fracture and repair of lacerations. She then underwent ORIF on 09/09/2023  Discharged 09/14/23 to SNF with wound vac No noted cardiac issues during her hospitalization  *** AF? SCAF *** VT *** BP% *** volume ***    Device information MDT CRT-D implanted 2003, RV ICD lead implanted 2006, last gen change 2014 Has a 6949 lead She has an abandoned RV pacing lead, though Dr. Graciela Husbands discusses failed [phone] lead prompting need to "pirate"  her pacing lead in 2006 Gen change/DOWNGRADE to CRT-P on 08/30/22  + appropriate tx for VT w/ATP  2019 2020 2021  Past Medical History:  Diagnosis Date   Biventricular ICD (implantable cardiac defibrillator) in place    downgraded from CRT-D to CRT-P 08/30/22   Breast cancer (HCC)    Cellulitis    Change in bowel habits    CHF (congestive heart failure) (HCC)    Colon polyps    Diabetes mellitus    Type II   Diarrhea    Dyslipidemia    HOH (hard of hearing)    left   HTN (hypertension)    LBBB (left bundle branch block)    chronic   Meniere's disease    Nonischemic cardiomyopathy (HCC) 2006   Presence of permanent cardiac pacemaker    T wave oversensing 01/17/2014   Ulcerative  colitis    Varicose veins    4 lazer  treatment each leg    Past Surgical History:  Procedure Laterality Date   ABDOMINAL HYSTERECTOMY     BIV PACEMAKER GENERATOR CHANGEOUT N/A 08/30/2022   Procedure: BIV PACEMAKER GENERATOR CHANGEOUT;  Surgeon: Duke Salvia, MD;  Location: [facility] INVASIVE CV LAB;  Service: Cardiovascular;  Laterality: N/A;   BREAST LUMPECTOMY WITH RADIOACTIVE SEED LOCALIZATION Right 01/05/2023   Procedure: RIGHT BREAST BRACKETED LUMPECTOMY WITH RADIOACTIVE SEED LOCALIZATION;  Surgeon: Abigail Miyamoto, MD;  Location[facility];  Service: General;  Laterality: Right;   CARDIAC CATHETERIZATION  08/2011   CAST APPLICATION Right 09/06/2023   Procedure:  CAST APPLICATION;  Surgeon: Tarry Kos, MD;  Location: [facility] OR;  Service: Orthopedics;  Laterality: Right;   CHOLECYSTECTOMY     COLONOSCOPY     EP Study  08/22/08   I & D EXTREMITY Right 09/06/2023   Procedure: IRRIGATION AND DEBRIDEMENT RIGHT ANKLE;  Surgeon: Tarry Kos, MD;  Location: [facility] OR;  Service: Orthopedics;  Laterality: Right;   IMPLANTABLE CARDIOVERTER DEFIBRILLATOR GENERATOR CHANGE N/A 08/22/2013   Procedure: IMPLANTABLE CARDIOVERTER DEFIBRILLATOR GENERATOR CHANGE;  Surgeon: Duke Salvia, MD;  Location: [facility] CATH LAB;  Service: Cardiovascular;  Laterality: N/A;   KNEE ARTHROSCOPY Left 04/14/2016   Procedure: LEFT KNEE ARTHROSCOPY, PARTIAL MEDIAL AND PARTIAL LATERAL MENISCECTOMY, MEDIAL PLICA;  Surgeon: Jodi Geralds, MD;  Location: [facility] OR;  Service: Orthopedics;  Laterality: Left;   mastoid sugery     in the setting of Meniere's disease   ORIF ANKLE FRACTURE Right 09/09/2023   Procedure: OPEN REDUCTION INTERNAL FIXATION (ORIF) ANKLE FRACTURE;  Surgeon: [name], MD;  Location: [facility] OR;  Service: Orthopedics;  Laterality: Right;   TONSILLECTOMY AND ADENOIDECTOMY      Current Outpatient Medications  Medication Sig Dispense Refill   acetaminophen (TYLENOL) 500 MG tablet Take 500 mg by mouth every 8 (eight) hours as needed for headache.     alendronate (FOSAMAX) 70 MG tablet Take 1 tablet (70 mg total) by mouth every 7 (seven) days. Take with a full glass of water on an empty stomach. (Patient taking differently: Take 70 mg by mouth every Sunday.) 4 tablet 11   aspirin EC 81 MG tablet Take 81 mg by mouth daily.     atorvastatin (LIPITOR) 40 MG tablet Take 40 mg by mouth daily.  3   Continuous Glucose Receiver (DEXCOM G6 RECEIVER) DEVI 1 Device by Does not apply route as directed. 1 each 11   Continuous Glucose Sensor (DEXCOM G6 SENSOR) MISC 1 Device by Does not apply route as directed. 3 each 11   Continuous Glucose Transmitter (DEXCOM G6 TRANSMITTER) MISC 1 Device by Does not  apply route as directed. 1 each 11   dicyclomine (BENTYL) 10 MG capsule TAKE 1 CAPSULE BY MOUTH THREE TIMES DAILY 30 MINUTES BEFORE MEALS 90 capsule 2   furosemide (LASIX) 20 MG tablet Take 1 tablet (20 mg total) by mouth daily. alternating (40 mg) every other day. (Patient taking differently: Take 20 mg by mouth daily.) 60 tablet 11   insulin glargine, 2 Unit Dial, (TOUJEO MAX SOLOSTAR) 300 UNIT/ML Solostar Pen Inject 30 Units into the skin daily.     metoprolol succinate (TOPROL-XL) 100 MG 24 hr tablet Take 100 mg by mouth 2 (two) times daily.     olmesartan (BENICAR) 40 MG tablet Take 40 mg by mouth daily.  3   oxyCODONE (OXY IR/ROXICODONE) 5 MG immediate release tablet  Take 1 tablet (5 mg total) by mouth every 4 (four) hours as needed for moderate pain (pain score 4-6) (pain score 4-6). 15 tablet 0   polyethylene glycol (MIRALAX / GLYCOLAX) 17 g packet Take 17 g by mouth daily as needed for mild constipation.     potassium chloride (K-DUR) 10 MEQ tablet Take 10 mEq by mouth 2 (two) times daily.     No current facility-administered medications for this visit.    Allergies:   Coreg [carvedilol], Firvanq [vancomycin], Lactose intolerance (gi), Lanoxin [digoxin], and Sudafed [pseudoephedrine]   Social History:  The patient  reports that she has quit smoking. Her smoking use included cigarettes. She has never been exposed to tobacco smoke. She has never used smokeless tobacco. She reports that she does not drink alcohol and does not use drugs.   Family History:  The patient's family history includes Breast cancer (age of onset: 40) in her mother; Emphysema (age of onset: 50) in her father; Healthy in her child; Heart disease in her father; Lung cancer in her cousin.  ROS:  Please see the history of present illness.    All other systems are reviewed and otherwise negative.   PHYSICAL EXAM:  VS:  There were no vitals taken for this visit. BMI: There is no height or weight on file to calculate  BMI. Well nourished, well developed, in no acute distress HEENT: normocephalic, atraumatic Neck: no JVD, carotid bruits or masses Cardiac: *** RRR; no significant murmurs, no rubs, or gallops Lungs:  *** CTA b/l, no wheezing, rhonchi or rales Abd: soft, nontender MS: no deformity, age appropriate atrophyatrophy Ext: *** trace edema, chronic looking skin changes Skin: warm and dry, no rash Neuro:  No gross deficits appreciated Psych: euthymic mood, full affect  *** PPM site is stable, no tethering or discomfort   EKG:  not done today  Device interrogation done today and reviewed by myself:  ***  03/30/22: TTE IMPRESSION: 1. Right carotid artery system: Less than 50% stenosis secondary to moderate multifocal atherosclerotic plaque formation.   2. Left carotid artery system: 50-69% stenosis secondary to focal atherosclerotic plaque formation in the proximal to mid internal carotid artery.   3.  Vertebral artery system: Patent with antegrade flow bilaterally.   Carotid Summary April 2021:  Right Carotid: Velocities in the right ICA are consistent with a 1-39%  stenosis.   Left Carotid: Velocities in the left ICA are consistent with a 1-39%  stenosis.   Vertebrals:  Bilateral vertebral arteries demonstrate antegrade flow.  Subclavians: Normal flow hemodynamics were seen in bilateral subclavian arteries.     CT HEAD IMPRESSION 01/2020: 1. No acute intracranial abnormality. If there is persisting clinical concern for acute/subacute ischemia, MRI could be obtained. 2. Moderate parenchymal volume loss and chronic microvascular angiopathy. 3. Prior left mastoidectomy.   Electronically Signed   By: Kreg Shropshire M.D.   On: 02/14/2020 23:51     Echo Study Conclusions from 07/2014 - Left ventricle: The cavity size was normal. Systolic function was   normal. The estimated ejection fraction was in the range of 50%   to 55%. There was an increased relative contribution of  atrial   contraction to ventricular filling. Doppler parameters are   consistent with abnormal left ventricular relaxation (grade 1   diastolic dysfunction). - Ventricular septum: Septal motion showed paradox. These changes   are consistent with right ventricular pacing. - Aortic valve: There was mild regurgitation. - Mitral valve: There is a calcified mobile  density in the LV   cavity most consistent with calcified redundant chordae tendinae.   This was present on echo in 2014. There was mild regurgitation. - Pulmonic valve: There was trivial regurgitation.   Recent Labs: 02/25/2023: ALT 10; TSH 1.93 09/10/2023: BUN 22; Creatinine, Ser 0.92; Hemoglobin 10.5; Magnesium 2.2; Platelets 119; Potassium 3.8; Sodium 138  02/25/2023: Cholesterol 116; HDL 35; LDL Cholesterol 58; Triglycerides 144   Estimated Creatinine Clearance: 33.9 mL/min (by C-G formula based on SCr of 0.92 mg/dL).   Wt Readings from Last 3 Encounters:  09/26/23 129 lb 8 oz (58.7 kg)  09/15/23 131 lb 9.6 oz (59.7 kg)  09/14/23 121 lb 4.1 oz (55 kg)     Other studies reviewed: Additional studies/records reviewed today include: summarized above  ASSESSMENT AND PLAN:  1. CRT-P     ***   2. NICM     Recovered LVEF by last echo in *** 2015     On *** BB, ARB, diuretic     *** Well compensated     *** OptiVol looks great  3. HTN     *** Looks ok       4. TIA     ***  5. VT     ***    Disposition: ***     Current medicines are reviewed at length with the patient today.  The patient did not have any concerns regarding medicines.  Norma Fredrickson, PA-C 09/28/2023 1:02 PM     [facility] [address] [place] [address] [phone] (office)  [phone] (fax)

## 2023-10-04 ENCOUNTER — Non-Acute Institutional Stay (SKILLED_NURSING_FACILITY): Payer: Self-pay | Admitting: Nurse Practitioner

## 2023-10-04 ENCOUNTER — Ambulatory Visit: Payer: Medicare Other | Admitting: Physician Assistant

## 2023-10-04 ENCOUNTER — Encounter: Payer: Self-pay | Admitting: Nurse Practitioner

## 2023-10-04 DIAGNOSIS — I10 Essential (primary) hypertension: Secondary | ICD-10-CM

## 2023-10-04 DIAGNOSIS — M8580 Other specified disorders of bone density and structure, unspecified site: Secondary | ICD-10-CM

## 2023-10-04 DIAGNOSIS — I700 Atherosclerosis of aorta: Secondary | ICD-10-CM | POA: Diagnosis not present

## 2023-10-04 DIAGNOSIS — N183 Chronic kidney disease, stage 3 unspecified: Secondary | ICD-10-CM

## 2023-10-04 DIAGNOSIS — E1122 Type 2 diabetes mellitus with diabetic chronic kidney disease: Secondary | ICD-10-CM | POA: Diagnosis not present

## 2023-10-04 DIAGNOSIS — K5732 Diverticulitis of large intestine without perforation or abscess without bleeding: Secondary | ICD-10-CM

## 2023-10-04 DIAGNOSIS — I5022 Chronic systolic (congestive) heart failure: Secondary | ICD-10-CM

## 2023-10-04 DIAGNOSIS — M1990 Unspecified osteoarthritis, unspecified site: Secondary | ICD-10-CM

## 2023-10-04 DIAGNOSIS — Z780 Asymptomatic menopausal state: Secondary | ICD-10-CM

## 2023-10-04 DIAGNOSIS — Z794 Long term (current) use of insulin: Secondary | ICD-10-CM

## 2023-10-04 NOTE — Assessment & Plan Note (Signed)
 OP 03/03/23 DEXA t score -1.236, on Ca, Vit D, recommend Fosamax 70mg  qwk

## 2023-10-04 NOTE — Assessment & Plan Note (Signed)
Sigmoid diverticulitis/IBS taking Bentyl, Imodium

## 2023-10-04 NOTE — Assessment & Plan Note (Signed)
Aortic atherosclerosis, taking ASA, Atorvastatin HLD, taking Atorvastatin, LDL 58 02/24/23

## 2023-10-04 NOTE — Assessment & Plan Note (Addendum)
T2DM/renal complication, taking insulin Toujeo 33u sq daily, increase insulin Aspart 7u/ 5u if CBG>201, Hgb A1c 7.5 02/25/23 CBG am [id number], noon 210-481, pm 259-394 Obtain Hgb A1c, CBC/diff, CMP/eGFR

## 2023-10-04 NOTE — Assessment & Plan Note (Signed)
taking Furosemide, Bun/creat 22/0.92 09/10/23

## 2023-10-04 NOTE — Progress Notes (Signed)
Location:   SNF [facility] Room Number: 58 Place of Service:  SNF (31) Provider: Arna Snipe Miosha Behe NP  Loye Vento X, NP  Patient Care Team: Peggy Monk X, NP as PCP - General (Internal Medicine) Abigail Miyamoto, MD as Consulting Physician (General Surgery) Rachel Moulds, MD as Consulting Physician (Hematology and Oncology) Dorothy Puffer, MD as Consulting Physician (Radiation Oncology)  Extended Emergency Contact Information Primary Emergency Contact: [name] Address: [address]          [place], [place] [address] [place] Home Phone: [phone] Mobile Phone: [phone] Relation: Son  Code Status:  DNR Goals of care: Advanced Directive information    09/14/2023   10:25 AM  Advanced Directives  Would patient like information on creating a medical advance directive? No - Patient declined     Chief Complaint  Patient presents with   Medical Management of Chronic Issues    HPI:  Pt is a 89 y.o. female seen today for medical management of chronic diseases.                     Healed multiple fractures sustained from fall 02/18/23: pelvic fractures: nondisplaced fxs of R superior and inferior pubic rami, minimally displaced fx of the inferior R sacral ala extended to the R SIJ. Mild compression fx of superior endplates of T12, L2. Prn Tylenol  Fall, mechanical T2DM/renal complication, taking insulin Toujeo 33u sq daily, insulin Aspart 5u if CBG>201, Hgb A1c 7.5 02/25/23 Mild bilateral hydronephrosis, no stones Sigmoid diverticulitis/IBS taking Bentyl, Imodium Aortic atherosclerosis, taking ASA, Atorvastatin HLD, taking Atorvastatin, LDL 58 02/24/23 Vit B12 deficiency, takes Vit B12, Hgb 10.5 09/10/23 Vit D deficiency, on Vit D, Vit D 41 02/24/23 CHF, taking Furosemide, Bun/creat 22/0.92 09/10/23 HTN, taking Metoprolol, Olmesartan OP 03/03/23 DEXA t score -1.236, on Ca, Vit D, recommend Fosamax 70mg  qwk              S/p R breat cancer lumpectomy,  f/u surgeon, 03/15/23 cancer center radiation tx             Radiation dermatitis right upper chest/breast, healed.    Past Medical History:  Diagnosis Date   Biventricular ICD (implantable cardiac defibrillator) in place    downgraded from CRT-D to CRT-P 08/30/22   Breast cancer (HCC)    Cellulitis    Change in bowel habits    CHF (congestive heart failure) (HCC)    Colon polyps    Diabetes mellitus    Type II   Diarrhea    Dyslipidemia    HOH (hard of hearing)    left   HTN (hypertension)    LBBB (left bundle branch block)    chronic   Meniere's disease    Nonischemic cardiomyopathy (HCC) 2006   Presence of permanent cardiac pacemaker    T wave oversensing 01/17/2014   Ulcerative colitis    Varicose veins    4 lazer  treatment each leg   Past Surgical History:  Procedure Laterality Date   ABDOMINAL HYSTERECTOMY     BIV PACEMAKER GENERATOR CHANGEOUT N/A 08/30/2022   Procedure: BIV PACEMAKER GENERATOR CHANGEOUT;  Surgeon: Duke Salvia, MD;  Location: [facility] INVASIVE CV LAB;  Service: Cardiovascular;  Laterality: N/A;   BREAST LUMPECTOMY WITH RADIOACTIVE SEED LOCALIZATION Right 01/05/2023   Procedure: RIGHT BREAST BRACKETED LUMPECTOMY WITH RADIOACTIVE SEED LOCALIZATION;  Surgeon: Abigail Miyamoto, MD;  Location: [facility] OR;  Service: General;  Laterality: Right;   CARDIAC CATHETERIZATION  08/2011  CAST APPLICATION Right 09/06/2023   Procedure: CAST APPLICATION;  Surgeon: Tarry Kos, MD;  Location: [facility] OR;  Service: Orthopedics;  Laterality: Right;   CHOLECYSTECTOMY     COLONOSCOPY     EP Study  08/22/08   I & D EXTREMITY Right 09/06/2023   Procedure: IRRIGATION AND DEBRIDEMENT RIGHT ANKLE;  Surgeon: Tarry Kos, MD;  Location: [facility] OR;  Service: Orthopedics;  Laterality: Right;   IMPLANTABLE CARDIOVERTER DEFIBRILLATOR GENERATOR CHANGE N/A 08/22/2013   Procedure: IMPLANTABLE CARDIOVERTER DEFIBRILLATOR GENERATOR CHANGE;  Surgeon: Duke Salvia, MD;  Location: [facility] CATH LAB;   Service: Cardiovascular;  Laterality: N/A;   KNEE ARTHROSCOPY Left 04/14/2016   Procedure: LEFT KNEE ARTHROSCOPY, PARTIAL MEDIAL AND PARTIAL LATERAL MENISCECTOMY, MEDIAL PLICA;  Surgeon: Jodi Geralds, MD;  Location: [facility] OR;  Service: Orthopedics;  Laterality: Left;   mastoid sugery     in the setting of Meniere's disease   ORIF ANKLE FRACTURE Right 09/09/2023   Procedure: OPEN REDUCTION INTERNAL FIXATION (ORIF) ANKLE FRACTURE;  Surgeon: [name], MD;  Location: [facility] OR;  Service: Orthopedics;  Laterality: Right;   TONSILLECTOMY AND ADENOIDECTOMY      Allergies  Allergen Reactions   Coreg [Carvedilol] Other (See Comments)    Unknown reaction   Firvanq [Vancomycin] Itching and Other (See Comments)    Red Taje Tondreau Syndrome   Lactose Intolerance (Gi)    Lanoxin [Digoxin] Other (See Comments)    DIG toxicity   Sudafed [Pseudoephedrine] Other (See Comments)    Makes skin feel like its crawling     Allergies as of 10/04/2023       Reactions   Coreg [carvedilol] Other (See Comments)   Unknown reaction   Firvanq [vancomycin] Itching, Other (See Comments)   Red Kenyette Gundy Syndrome   Lactose Intolerance (gi)    Lanoxin [digoxin] Other (See Comments)   DIG toxicity   Sudafed [pseudoephedrine] Other (See Comments)   Makes skin feel like its crawling        Medication List        Accurate as of October 04, 2023  3:36 PM. If you have any questions, ask your nurse or doctor.          acetaminophen 500 MG tablet Commonly known as: TYLENOL Take 500 mg by mouth every 8 (eight) hours as needed for headache.   alendronate 70 MG tablet Commonly known as: FOSAMAX Take 1 tablet (70 mg total) by mouth every 7 (seven) days. Take with a full glass of water on an empty stomach. What changed:  when to take this additional instructions   aspirin EC 81 MG tablet Take 81 mg by mouth daily.   atorvastatin 40 MG tablet Commonly known as: LIPITOR Take 40 mg by mouth daily.   Dexcom G6 Receiver  Devi 1 Device by Does not apply route as directed.   Dexcom G6 Sensor Misc 1 Device by Does not apply route as directed.   Dexcom G6 Transmitter Misc 1 Device by Does not apply route as directed.   dicyclomine 10 MG capsule Commonly known as: BENTYL TAKE 1 CAPSULE BY MOUTH THREE TIMES DAILY 30 MINUTES BEFORE MEALS   furosemide 20 MG tablet Commonly known as: LASIX Take 1 tablet (20 mg total) by mouth daily. alternating (40 mg) every other day. What changed: additional instructions   metoprolol succinate 100 MG 24 hr tablet Commonly known as: TOPROL-XL Take 100 mg by mouth 2 (two) times daily.   olmesartan 40 MG tablet Commonly known as:  BENICAR Take 40 mg by mouth daily.   oxyCODONE 5 MG immediate release tablet Commonly known as: Oxy IR/ROXICODONE Take 1 tablet (5 mg total) by mouth every 4 (four) hours as needed for moderate pain (pain score 4-6) (pain score 4-6).   polyethylene glycol 17 g packet Commonly known as: MIRALAX / GLYCOLAX Take 17 g by mouth daily as needed for mild constipation.   potassium chloride 10 MEQ tablet Commonly known as: KLOR-CON M Take 10 mEq by mouth 2 (two) times daily.   Toujeo Max SoloStar 300 UNIT/ML Solostar Pen Generic drug: insulin glargine (2 Unit Dial) Inject 30 Units into the skin daily.        Review of Systems  Constitutional:  Negative for appetite change, fatigue and fever.  HENT:  Positive for hearing loss. Negative for congestion and trouble swallowing.   Eyes:  Negative for visual disturbance.  Respiratory:  Negative for cough, chest tightness and wheezing.        Residual hacking cough   Cardiovascular:  Positive for leg swelling.  Gastrointestinal:  Negative for abdominal pain, constipation and diarrhea.  Genitourinary:  Negative for dysuria and urgency.  Musculoskeletal:  Positive for arthralgias, back pain and gait problem.       Right lower leg pain, in long boot  Skin:  Positive for wound. Negative for color  change.  Neurological:  Negative for tremors and headaches.  Psychiatric/Behavioral:  Negative for behavioral problems and sleep disturbance. The patient is not nervous/anxious.     Immunization History  Administered Date(s) Administered   Influenza, High Dose Seasonal PF 10/06/2016, 10/05/2017, 09/14/2018, 08/17/2022   Influenza,inj,Quad PF,6+ Mos 10/02/2014   PNEUMOCOCCAL CONJUGATE-20 03/16/2023   PPD Test 02/22/2023   Pneumococcal-Unspecified 03/29/2022   Tdap 09/29/2022, 09/06/2023   Unspecified SARS-COV-2 Vaccination 11/05/2019, 12/03/2019, 03/31/2021, 07/21/2021, 03/19/2022   Zoster Recombinant(Shingrix) 03/07/2023   Pertinent  Health Maintenance Due  Topic Date Due   FOOT EXAM  Never done   OPHTHALMOLOGY EXAM  Never done   INFLUENZA VACCINE  06/02/2023   HEMOGLOBIN A1C  08/27/2023   DEXA SCAN  Completed      05/19/2023    2:37 PM 06/02/2023    2:49 PM 06/09/2023    8:48 AM 07/08/2023   10:35 AM 07/29/2023    9:55 AM  Fall Risk  Falls in the past year? 1 1 1 1  0  Was there an injury with Fall? 1 1 1 1  0  Fall Risk Category Calculator 3 3 3 3  0  Patient at Risk for Falls Due to  Impaired balance/gait;History of fall(s) History of fall(s);Impaired balance/gait History of fall(s);Impaired balance/gait;Impaired mobility History of fall(s);Impaired balance/gait;Impaired mobility  Fall risk Follow up Falls evaluation completed Falls evaluation completed Falls evaluation completed Falls evaluation completed Falls evaluation completed   Functional Status Survey:    Vitals:   10/04/23 1521  BP: 126/60  Pulse: 70  Resp: 18  Temp: (!) 97.4 F (36.3 C)  SpO2: 97%  Weight: 127 lb (57.6 kg)   Body mass index is 20.5 kg/m. Physical Exam Vitals and nursing note reviewed.  Constitutional:      Appearance: Normal appearance.  HENT:     Head: Normocephalic and atraumatic.     Nose: Nose normal.     Mouth/Throat:     Mouth: Mucous membranes are moist.  Eyes:     Extraocular  Movements: Extraocular movements intact.     Conjunctiva/sclera: Conjunctivae normal.     Pupils: Pupils are equal, round, and  reactive to light.  Cardiovascular:     Rate and Rhythm: Normal rate and regular rhythm.     Heart sounds: No murmur heard.    Comments: Pacemaker left upper chest(defibrillator was removed per patient's request in the past) Pulmonary:     Effort: Pulmonary effort is normal.     Breath sounds: No rales.  Abdominal:     General: Bowel sounds are normal.     Palpations: Abdomen is soft.     Tenderness: There is no abdominal tenderness.  Musculoskeletal:        General: Tenderness present.     Cervical back: Normal range of motion and neck supple.     Right lower leg: Edema present.     Left lower leg: No edema.     Comments: Lower back, pelvic/thigh pain with movement/weight bearing.  Mild edema RLE  Skin:    General: Skin is warm and dry.     Findings: Erythema present.     Comments: S/p R breat cancer lumpectomy, no apparent edema R arm.   Radiation dermatitis right upper chest/breast, healed.  R lower leg sutures intact, improved redness, warmth, swelling, sutures removed, a small surgical wound dehiscence noted, no odorous drainage.    Neurological:     General: No focal deficit present.     Mental Status: She is alert and oriented to person, place, and time. Mental status is at baseline.     Gait: Gait abnormal.     Comments: Ambulates with walker.   Psychiatric:        Mood and Affect: Mood normal.        Behavior: Behavior normal.        Thought Content: Thought content normal.     Labs reviewed: Recent Labs    09/08/23 0615 09/09/23 0559 09/10/23 0530  NA 140 141 138  K 3.3* 4.3 3.8  CL 106 108 102  CO2 24 25 25   GLUCOSE 199* 143* 181*  BUN 25* 23 22  CREATININE 0.93 0.94 0.92  CALCIUM 8.5* 8.7* 8.3*  MG 2.0 2.1 2.2   Recent Labs    12/08/22 0801 02/25/23 0000  AST 19 16  ALT 13 10  ALKPHOS 67 62  BILITOT 0.8  --   PROT  6.6  --   ALBUMIN 4.0 3.8   Recent Labs    12/08/22 0801 12/31/22 1430 02/25/23 0000 09/06/23 2148 09/08/23 0615 09/09/23 0559 09/10/23 0530  WBC 5.9   < > 5.3   < > 12.5* 11.9* 8.1  NEUTROABS 4.4  --  3,546.00  --   --   --   --   HGB 14.5   < > 12.9   < > 11.3* 11.6* 10.5*  HCT 42.2   < > 38   < > 33.4* 34.9* 31.9*  MCV 93.6   < >  --    < > 95.4 97.5 95.8  PLT 153   < > 179   < > 137* 136* 119*   < > = values in this interval not displayed.   Lab Results  Component Value Date   TSH 1.93 02/25/2023   Lab Results  Component Value Date   HGBA1C 7.5 02/25/2023   Lab Results  Component Value Date   CHOL 116 02/25/2023   HDL 35 02/25/2023   LDLCALC 58 02/25/2023   TRIG 144 02/25/2023   CHOLHDL 2.7 11/10/2020    Significant Diagnostic Results in last 30 days:  DG MINI C-ARM IMAGE  ONLY  Result Date: 09/09/2023 There is no interpretation for this exam.  This order is for images obtained during a surgical procedure.  Please See "Surgeries" Tab for more information regarding the procedure.   DG Tibia/Fibula Right  Result Date: 09/06/2023 CLINICAL DATA:  Fall EXAM: RIGHT TIBIA AND FIBULA - 2 VIEW COMPARISON:  None Available. FINDINGS: Oblique fracture through the distal right fibular metaphysis. Widening of the medial ankle mortise. No visible tibial fracture. Chondrocalcinosis and degenerative changes in the knee joint. No joint effusion. IMPRESSION: Distal fibular metaphyseal fracture with widening of the ankle mortise. Electronically Signed   By: Charlett Nose M.D.   On: 09/06/2023 23:05   DG Shoulder Right  Result Date: 09/06/2023 CLINICAL DATA:  Fall EXAM: RIGHT SHOULDER - 2+ VIEW COMPARISON:  None Available. FINDINGS: Degenerative changes in the [facility] joint with joint space narrowing and spurring. Glenohumeral joint is maintained. No acute bony abnormality. Specifically, no fracture, subluxation, or dislocation. Soft tissues are intact. IMPRESSION: Degenerative changes in the  [facility] joint.  No acute bony abnormality. Electronically Signed   By: Charlett Nose M.D.   On: 09/06/2023 23:04   DG Forearm Right  Result Date: 09/06/2023 CLINICAL DATA:  Fall EXAM: RIGHT FOREARM - 2 VIEW COMPARISON:  None Available. FINDINGS: No acute bony abnormality. Specifically, no fracture, subluxation, or dislocation. Chondrocalcinosis in the triangular fibrocartilage. Soft tissues intact. IMPRESSION: No acute bony abnormality. Electronically Signed   By: Charlett Nose M.D.   On: 09/06/2023 23:04   DG Elbow Complete Right  Result Date: 09/06/2023 CLINICAL DATA:  Fall EXAM: RIGHT ELBOW - COMPLETE 3+ VIEW COMPARISON:  None Available. FINDINGS: No acute bony abnormality. Specifically, no fracture, subluxation, or dislocation. No joint effusion. Soft tissues are intact. IMPRESSION: No acute bony abnormality. Electronically Signed   By: Charlett Nose M.D.   On: 09/06/2023 23:03   DG Ankle Right Port  Result Date: 09/06/2023 CLINICAL DATA:  Fall, ankle pain EXAM: PORTABLE RIGHT ANKLE - 2 VIEW COMPARISON:  None Available. FINDINGS: Oblique fracture in the distal right fibular metaphysis. No visible tibial fracture. There is widening of the medial ankle mortise. IMPRESSION: Distal fibular fracture with widening of the medial ankle mortise. Electronically Signed   By: Charlett Nose M.D.   On: 09/06/2023 23:01    Assessment/Plan  Type 2 diabetes mellitus with renal complication (HCC) T2DM/renal complication, taking insulin Toujeo 33u sq daily, increase insulin Aspart 7u/ 5u if CBG>201, Hgb A1c 7.5 02/25/23 CBG am [id number], noon 210-481, pm 259-394 Obtain Hgb A1c, CBC/diff, CMP/eGFR  Sigmoid diverticulitis Sigmoid diverticulitis/IBS taking Bentyl, Imodium  Aortic atherosclerosis (HCC) Aortic atherosclerosis, taking ASA, Atorvastatin HLD, taking Atorvastatin, LDL 58 02/24/23  SYSTOLIC HEART FAILURE, CHRONIC  taking Furosemide, Bun/creat 22/0.92 09/10/23  HTN (hypertension) Blood pressure is  controlled,  taking Metoprolol, Olmesartan  Osteopenia after menopause OP 03/03/23 DEXA t score -1.236, on Ca, Vit D, recommend Fosamax 70mg  qwk   Osteoarthritis RLE peri wound cellulitis, improved redness, warmth, mild swelling R lower leg, sutures removed, a small surgical wound dehiscence noted, no odorous drainage, completed 7 day course of Doxycycline.              Hospitalized 09/06/23-09/14/23 for open fracture of the right distal fibula, trimalleolar. S/p ORIF  Ambulates with walker    Family/ staff Communication: plan of care reviewed with the patient and charge nurse.   Labs/tests ordered:  CBC/diff, CMP/eGFR, Hgb a1c  Time spend 30 minutes.

## 2023-10-04 NOTE — Assessment & Plan Note (Signed)
Blood pressure is controlled,  taking Metoprolol, Olmesartan 

## 2023-10-04 NOTE — Assessment & Plan Note (Addendum)
RLE peri wound cellulitis, improved redness, warmth, mild swelling R lower leg, sutures removed, a small surgical wound dehiscence noted, no odorous drainage, completed 7 day course of Doxycycline.              Hospitalized 09/06/23-09/14/23 for open fracture of the right distal fibula, trimalleolar. S/p ORIF  Ambulates with walker

## 2023-10-05 ENCOUNTER — Encounter: Payer: Self-pay | Admitting: Orthopedic Surgery

## 2023-10-05 NOTE — Progress Notes (Signed)
Office Visit Note   Patient: Grace Robinson           Date of Birth: 01/28/1926           MRN: [id number] Visit Date: 09/27/2023              Requested by: Mast, Man X, NP [address] [place],  [place] [address] PCP: Mast, Man X, NP  Chief Complaint  Patient presents with   Right Ankle - Routine Post Op    09/09/2023 ORIF right ankle fracture       HPI: Patient is a 89 year old woman who is status post open reduction internal fixation right ankle fracture November 8.  She is using protein supplements compression and elevation.  She is weightbearing as tolerated in the fracture boot.  [facility] that [facility] looked infected and she was started on doxycycline.  Assessment & Plan: Visit Diagnoses:  1. Open fracture dislocation of ankle joint, right, type III, initial encounter     Plan: Patient will complete her course of doxycycline.  Recommended elevation sutures harvested.  Repeat radiographs 3 view right ankle at follow-up.  Follow-Up Instructions: Return in about 4 weeks (around 10/25/2023).   Ortho Exam  Patient is alert, oriented, no adenopathy, well-dressed, normal affect, normal respiratory effort. Examination the incisions are well-healed there is no cellulitis there is some redness from swelling.  Sutures are harvested.  There is no drainage there is no tenderness to palpation.  Imaging: No results found. No images are attached to the encounter.  Labs: Lab Results  Component Value Date   HGBA1C 7.5 02/25/2023   HGBA1C 7.5 (H) 12/31/2022   HGBA1C 7.4 (H) 11/10/2020   ESRSEDRATE 48 (H) 09/06/2016   CRP 0.1 (L) 09/06/2016     Lab Results  Component Value Date   ALBUMIN 3.8 02/25/2023   ALBUMIN 4.0 12/08/2022   ALBUMIN 4.7 (H) 11/10/2020    Lab Results  Component Value Date   MG 2.2 09/10/2023   MG 2.1 09/09/2023   MG 2.0 09/08/2023   Lab Results  Component Value Date   VD25OH 41 02/25/2023    No  results found for: "PREALBUMIN"    Latest Ref Rng & Units 09/10/2023    5:30 AM 09/09/2023    5:59 AM 09/08/2023    6:15 AM  CBC EXTENDED  WBC 4.0 - 10.5 K/uL 8.1  11.9  12.5   RBC 3.87 - 5.11 MIL/uL 3.33  3.58  3.50   Hemoglobin 12.0 - 15.0 g/dL 52.8  41.3  24.4   HCT 36.0 - 46.0 % 31.9  34.9  33.4   Platelets 150 - 400 K/uL 119  136  137      There is no height or weight on file to calculate BMI.  Orders:  No orders of the defined types were placed in this encounter.  No orders of the defined types were placed in this encounter.    Procedures: No procedures performed  Clinical Data: No additional findings.  ROS:  All other systems negative, except as noted in the HPI. Review of Systems  Objective: Vital Signs: There were no vitals taken for this visit.  Specialty Comments:  No specialty comments available.  PMFS History: Patient Active Problem List   Diagnosis Date Noted   Cellulitis 09/26/2023   IBS (irritable bowel syndrome) 09/10/2023   Open trimalleolar fracture of ankle, right, type III, initial encounter 09/09/2023   Hypokalemia  09/08/2023   Dislocation of ankle, right, open, initial encounter 09/07/2023   Open fracture of right distal fibula 09/06/2023   COVID-19 virus infection 06/09/2023   Gait abnormality 05/19/2023   Osteoarthritis 05/03/2023   Radiation dermatitis 05/03/2023   Osteopenia after menopause 03/04/2023   Closed pelvic fracture (HCC) 02/23/2023   Lower back pain 02/23/2023   Hydronephrosis 02/23/2023   Sigmoid diverticulitis 02/23/2023   Aortic atherosclerosis (HCC) 02/23/2023   Hyperlipidemia associated with type 2 diabetes mellitus (HCC) 02/23/2023   Vitamin B12 deficiency 02/23/2023   Vitamin D deficiency 02/23/2023   Type 2 diabetes mellitus with renal complication (HCC) 02/23/2023   S/P lumpectomy, right breast 01/05/2023   Ductal carcinoma in situ (DCIS) of right breast 12/06/2022   NICM (nonischemic cardiomyopathy) (HCC)  07/30/2021   Degenerative tear of posterior horn of medial meniscus of left knee 04/14/2016   Tear of lateral meniscus of left knee 04/14/2016   Synovial plica of left knee 04/14/2016   Mechanical complication of implantable cardioverter-defibrillator (ICD) 01/17/2014   Fall 07/10/2013   Paroxysmal ventricular tachycardia (HCC) 12/13/2012   Biventricular implantable cardioverter-defibrillator -[employer]tr[employer] 12/08/2011   Atrial tachycardia (HCC) 12/08/2011   HTN (hypertension) 05/19/2011   CARDIOMYOPATHY, PRIMARY, DILATED 12/01/2010   SYSTOLIC HEART FAILURE, CHRONIC 12/01/2010   Past Medical History:  Diagnosis Date   Biventricular ICD (implantable cardiac defibrillator) in place    downgraded from CRT-D to CRT-P 08/30/22   Breast cancer (HCC)    Cellulitis    Change in bowel habits    CHF (congestive heart failure) (HCC)    Colon polyps    Diabetes mellitus    Type II   Diarrhea    Dyslipidemia    HOH (hard of hearing)    left   HTN (hypertension)    LBBB (left bundle branch block)    chronic   Meniere's disease    Nonischemic cardiomyopathy (HCC) 2006   Presence of permanent cardiac pacemaker    T wave oversensing 01/17/2014   Ulcerative colitis    Varicose veins    4 lazer  treatment each leg    Family History  Problem Relation Age of Onset   Breast cancer Mother 35       met to lungs   Emphysema Father 3   Heart disease Father    Healthy Child    Lung cancer Cousin        maternal first cousin, she smoked   Colon cancer Neg Hx    Rectal cancer Neg Hx    Stomach cancer Neg Hx    Liver cancer Neg Hx    Esophageal cancer Neg Hx     Past Surgical History:  Procedure Laterality Date   ABDOMINAL HYSTERECTOMY     BIV PACEMAKER GENERATOR CHANGEOUT N/A 08/30/2022   Procedure: BIV PACEMAKER GENERATOR CHANGEOUT;  Surgeon: Duke Salvia, MD;  Location: [facility] INVASIVE CV LAB;  Service: Cardiovascular;  Laterality: N/A;   BREAST LUMPECTOMY WITH RADIOACTIVE SEED  LOCALIZATION Right 01/05/2023   Procedure: RIGHT BREAST BRACKETED LUMPECTOMY WITH RADIOACTIVE SEED LOCALIZATION;  Surgeon: Abigail Miyamoto, MD;  Location: [facility] OR;  Service: General;  Laterality: Right;   CARDIAC CATHETERIZATION  08/2011   CAST APPLICATION Right 09/06/2023   Procedure: CAST APPLICATION;  Surgeon: Tarry Kos, MD;  Location: [facility] OR;  Service: Orthopedics;  Laterality: Right;   CHOLECYSTECTOMY     COLONOSCOPY     EP Study  08/22/08   I & D EXTREMITY Right 09/06/2023   Procedure: IRRIGATION  AND DEBRIDEMENT RIGHT ANKLE;  Surgeon: Tarry Kos, MD;  Location: [facility] OR;  Service: Orthopedics;  Laterality: Right;   IMPLANTABLE CARDIOVERTER DEFIBRILLATOR GENERATOR CHANGE N/A 08/22/2013   Procedure: IMPLANTABLE CARDIOVERTER DEFIBRILLATOR GENERATOR CHANGE;  Surgeon: Duke Salvia, MD;  Location: [facility] CATH LAB;  Service: Cardiovascular;  Laterality: N/A;   KNEE ARTHROSCOPY Left 04/14/2016   Procedure: LEFT KNEE ARTHROSCOPY, PARTIAL MEDIAL AND PARTIAL LATERAL MENISCECTOMY, MEDIAL PLICA;  Surgeon: Jodi Geralds, MD;  Location: [facility] OR;  Service: Orthopedics;  Laterality: Left;   mastoid sugery     in the setting of Meniere's disease   ORIF ANKLE FRACTURE Right 09/09/2023   Procedure: OPEN REDUCTION INTERNAL FIXATION (ORIF) ANKLE FRACTURE;  Surgeon: [name], MD;  Location: [facility] OR;  Service: Orthopedics;  Laterality: Right;   TONSILLECTOMY AND ADENOIDECTOMY     Social History   Occupational History   Occupation: retired    Comment: Runner, 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

## 2023-10-06 ENCOUNTER — Encounter: Payer: Medicare Other | Admitting: Nurse Practitioner

## 2023-10-07 LAB — BASIC METABOLIC PANEL
BUN: 17 (ref 4–21)
CO2: 28 — AB (ref 13–22)
Chloride: 105 (ref 99–108)
Creatinine: 0.9 (ref 0.5–1.1)
Glucose: 213
Potassium: 4.2 meq/L (ref 3.5–5.1)
Sodium: 142 (ref 137–147)

## 2023-10-07 LAB — CBC AND DIFFERENTIAL
HCT: 35 — AB (ref 36–46)
Hemoglobin: 11.6 — AB (ref 12.0–16.0)
Neutrophils Absolute: 2838
Platelets: 145 10*3/uL — AB (ref 150–400)
WBC: 4.6

## 2023-10-07 LAB — CBC: RBC: 3.57 — AB (ref 3.87–5.11)

## 2023-10-07 LAB — COMPREHENSIVE METABOLIC PANEL
Albumin: 3.3 — AB (ref 3.5–5.0)
Calcium: 8.5 — AB (ref 8.7–10.7)
Globulin: 2.4
eGFR: 63

## 2023-10-07 LAB — HEPATIC FUNCTION PANEL
ALT: 16 U/L (ref 7–35)
AST: 20 (ref 13–35)
Alkaline Phosphatase: 103 (ref 25–125)
Bilirubin, Total: 0.4

## 2023-10-07 NOTE — Progress Notes (Signed)
This encounter was created in error - please disregard.

## 2023-10-10 ENCOUNTER — Encounter: Payer: Self-pay | Admitting: Nurse Practitioner

## 2023-10-10 ENCOUNTER — Non-Acute Institutional Stay (SKILLED_NURSING_FACILITY): Payer: Self-pay | Admitting: Nurse Practitioner

## 2023-10-10 ENCOUNTER — Ambulatory Visit: Payer: Medicare Other | Attending: Internal Medicine

## 2023-10-10 DIAGNOSIS — I5022 Chronic systolic (congestive) heart failure: Secondary | ICD-10-CM

## 2023-10-10 DIAGNOSIS — E1169 Type 2 diabetes mellitus with other specified complication: Secondary | ICD-10-CM

## 2023-10-10 DIAGNOSIS — Z66 Do not resuscitate: Secondary | ICD-10-CM

## 2023-10-10 DIAGNOSIS — E1122 Type 2 diabetes mellitus with diabetic chronic kidney disease: Secondary | ICD-10-CM | POA: Diagnosis not present

## 2023-10-10 DIAGNOSIS — M79661 Pain in right lower leg: Secondary | ICD-10-CM

## 2023-10-10 DIAGNOSIS — Z95810 Presence of automatic (implantable) cardiac defibrillator: Secondary | ICD-10-CM

## 2023-10-10 DIAGNOSIS — N183 Chronic kidney disease, stage 3 unspecified: Secondary | ICD-10-CM

## 2023-10-10 DIAGNOSIS — M8580 Other specified disorders of bone density and structure, unspecified site: Secondary | ICD-10-CM

## 2023-10-10 DIAGNOSIS — E785 Hyperlipidemia, unspecified: Secondary | ICD-10-CM

## 2023-10-10 DIAGNOSIS — Z780 Asymptomatic menopausal state: Secondary | ICD-10-CM

## 2023-10-10 DIAGNOSIS — K5732 Diverticulitis of large intestine without perforation or abscess without bleeding: Secondary | ICD-10-CM | POA: Diagnosis not present

## 2023-10-10 DIAGNOSIS — Z794 Long term (current) use of insulin: Secondary | ICD-10-CM

## 2023-10-10 DIAGNOSIS — I10 Essential (primary) hypertension: Secondary | ICD-10-CM

## 2023-10-10 NOTE — Assessment & Plan Note (Signed)
Sigmoid diverticulitis/IBS taking Bentyl, Imodium

## 2023-10-10 NOTE — Progress Notes (Signed)
Location:  [facility] Room Number: 023-A Place of Service:  SNF (31) Provider:  Mercadez Heitman X,NP  Ilias Stcharles X, NP  Patient Care Team: Aleck Locklin X, NP as PCP - General (Internal Medicine) Abigail Miyamoto, MD as Consulting Physician (General Surgery) Rachel Moulds, MD as Consulting Physician (Hematology and Oncology) Dorothy Puffer, MD as Consulting Physician (Radiation Oncology)  Extended Emergency Contact Information Primary Emergency Contact: [name] Address: [address]          [place], [place] [address] [place] of [place] Home Phone: [phone] Mobile Phone: [phone] Relation: Son  Code Status:  DNR Goals of care: Advanced Directive information    10/10/2023   12:23 PM  Advanced Directives  Does Patient Have a Medical Advance Directive? Yes  Type of Advance Directive Out of facility DNR (pink MOST or yellow form)  Does patient want to make changes to medical advance directive? No - Patient declined  Pre-existing out of facility DNR order (yellow form or pink MOST form) Yellow form placed in chart (order not valid for inpatient use)     Chief Complaint  Patient presents with   Acute Visit    Back of right left elbow pain.    HPI:  Pt is a 89 y.o. female seen today for an acute visit for c/o the right calf pain, redness/warmth medical and lateral lower leg above the right ankle in surgical incision areas, lateral RLE a non healing incision about 2 inch in length, no s/s of infection. Mild swelling RLE, dorsalis pedis and posterior tibial pulses not felt.   Healed multiple fractures sustained from fall 02/18/23: pelvic fractures: nondisplaced fxs of R superior and inferior pubic rami, minimally displaced fx of the inferior R sacral ala extended to the R SIJ. Mild compression fx of superior endplates of T12, L2. Prn Tylenol  Fall, mechanical T2DM/renal complication, taking insulin Toujeo 36/33u sq daily since 10/07/23,  insulin  Aspart 7/5u if CBG>201 10/05/23, Hgb A1c 7.5 02/25/23<<9.3 10/06/23 Mild bilateral hydronephrosis, no stones Sigmoid diverticulitis/IBS taking Bentyl, Imodium Aortic atherosclerosis, taking ASA, Atorvastatin HLD, taking Atorvastatin, LDL 58 02/24/23 Vit B12 deficiency, takes Vit B12, Hgb 11.6 10/06/23 Vit D deficiency, on Vit D, Vit D 41 02/24/23 CHF, taking Furosemide, Bun/creat 17/0.85 10/06/23 HTN, taking Metoprolol, Olmesartan OP 03/03/23 DEXA t score -1.236, on Ca, Vit D, on Fosamax 70mg  qwk              S/p R breat cancer lumpectomy, f/u surgeon, 03/15/23 cancer center radiation tx             Radiation dermatitis right upper chest/breast, healed.        Past Medical History:  Diagnosis Date   Biventricular ICD (implantable cardiac defibrillator) in place    downgraded from CRT-D to CRT-P 08/30/22   Breast cancer (HCC)    Cellulitis    Change in bowel habits    CHF (congestive heart failure) (HCC)    Colon polyps    Diabetes mellitus    Type II   Diarrhea    Dyslipidemia    HOH (hard of hearing)    left   HTN (hypertension)    LBBB (left bundle branch block)    chronic   Meniere's disease    Nonischemic cardiomyopathy (HCC) 2006   Presence of permanent cardiac pacemaker    T wave oversensing 01/17/2014   Ulcerative colitis    Varicose veins    4 lazer  treatment each leg  Past Surgical History:  Procedure Laterality Date   ABDOMINAL HYSTERECTOMY     BIV PACEMAKER GENERATOR CHANGEOUT N/A 08/30/2022   Procedure: BIV PACEMAKER GENERATOR CHANGEOUT;  Surgeon: Duke Salvia, MD;  Location: [facility] INVASIVE CV LAB;  Service: Cardiovascular;  Laterality: N/A;   BREAST LUMPECTOMY WITH RADIOACTIVE SEED LOCALIZATION Right 01/05/2023   Procedure: RIGHT BREAST BRACKETED LUMPECTOMY WITH RADIOACTIVE SEED LOCALIZATION;  Surgeon: Abigail Miyamoto, MD;  Location: [facility] OR;  Service: General;  Laterality: Right;   CARDIAC CATHETERIZATION  08/2011   CAST APPLICATION Right 09/06/2023   Procedure:  CAST APPLICATION;  Surgeon: Tarry Kos, MD;  Location: [facility] OR;  Service: Orthopedics;  Laterality: Right;   CHOLECYSTECTOMY     COLONOSCOPY     EP Study  08/22/08   I & D EXTREMITY Right 09/06/2023   Procedure: IRRIGATION AND DEBRIDEMENT RIGHT ANKLE;  Surgeon: Tarry Kos, MD;  Location: [facility] OR;  Service: Orthopedics;  Laterality: Right;   IMPLANTABLE CARDIOVERTER DEFIBRILLATOR GENERATOR CHANGE N/A 08/22/2013   Procedure: IMPLANTABLE CARDIOVERTER DEFIBRILLATOR GENERATOR CHANGE;  Surgeon: Duke Salvia, MD;  Location: [facility] CATH LAB;  Service: Cardiovascular;  Laterality: N/A;   KNEE ARTHROSCOPY Left 04/14/2016   Procedure: LEFT KNEE ARTHROSCOPY, PARTIAL MEDIAL AND PARTIAL LATERAL MENISCECTOMY, MEDIAL PLICA;  Surgeon: Jodi Geralds, MD;  Location: [facility] OR;  Service: Orthopedics;  Laterality: Left;   mastoid sugery     in the setting of Meniere's disease   ORIF ANKLE FRACTURE Right 09/09/2023   Procedure: OPEN REDUCTION INTERNAL FIXATION (ORIF) ANKLE FRACTURE;  Surgeon: [name], MD;  Location: [facility] OR;  Service: Orthopedics;  Laterality: Right;   TONSILLECTOMY AND ADENOIDECTOMY      Allergies  Allergen Reactions   Coreg [Carvedilol] Other (See Comments)    Unknown reaction   Firvanq [Vancomycin] Itching and Other (See Comments)    Red Cherylann Hobday Syndrome   Lactose Intolerance (Gi)    Lanoxin [Digoxin] Other (See Comments)    DIG toxicity   Sudafed [Pseudoephedrine] Other (See Comments)    Makes skin feel like its crawling     Outpatient Encounter Medications as of 10/10/2023  Medication Sig   acetaminophen (TYLENOL) 500 MG tablet Take 500 mg by mouth every 8 (eight) hours as needed for headache.   alendronate (FOSAMAX) 70 MG tablet Take 1 tablet (70 mg total) by mouth every 7 (seven) days. Take with a full glass of water on an empty stomach. (Patient taking differently: Take 70 mg by mouth every Sunday.)   aspirin EC 81 MG tablet Take 81 mg by mouth daily.   atorvastatin (LIPITOR) 40 MG  tablet Take 40 mg by mouth daily.   Continuous Glucose Receiver (DEXCOM G6 RECEIVER) DEVI 1 Device by Does not apply route as directed.   Continuous Glucose Sensor (DEXCOM G6 SENSOR) MISC 1 Device by Does not apply route as directed.   Continuous Glucose Transmitter (DEXCOM G6 TRANSMITTER) MISC 1 Device by Does not apply route as directed.   furosemide (LASIX) 20 MG tablet Take 1 tablet (20 mg total) by mouth daily. alternating (40 mg) every other day. (Patient taking differently: Take 20 mg by mouth daily.)   insulin glargine, 2 Unit Dial, (TOUJEO MAX SOLOSTAR) 300 UNIT/ML Solostar Pen Inject 30 Units into the skin daily.   metoprolol succinate (TOPROL-XL) 100 MG 24 hr tablet Take 100 mg by mouth 2 (two) times daily.   olmesartan (BENICAR) 40 MG tablet Take 40 mg by mouth daily.   oxyCODONE (OXY IR/ROXICODONE) 5 MG  immediate release tablet Take 1 tablet (5 mg total) by mouth every 4 (four) hours as needed for moderate pain (pain score 4-6) (pain score 4-6).   polyethylene glycol (MIRALAX / GLYCOLAX) 17 g packet Take 17 g by mouth daily as needed for mild constipation.   potassium chloride (K-DUR) 10 MEQ tablet Take 10 mEq by mouth 2 (two) times daily.   dicyclomine (BENTYL) 10 MG capsule TAKE 1 CAPSULE BY MOUTH THREE TIMES DAILY 30 MINUTES BEFORE MEALS (Patient not taking: Reported on 10/10/2023)   No facility-administered encounter medications on file as of 10/10/2023.    Review of Systems  Constitutional:  Negative for appetite change, fatigue and fever.  HENT:  Positive for hearing loss. Negative for congestion and trouble swallowing.   Eyes:  Negative for visual disturbance.  Respiratory:  Negative for cough, chest tightness and wheezing.        Residual hacking cough   Cardiovascular:  Positive for leg swelling.  Gastrointestinal:  Negative for abdominal pain, constipation and diarrhea.  Genitourinary:  Negative for dysuria and urgency.  Musculoskeletal:  Positive for arthralgias, back  pain and gait problem.       Right lower leg pain in the calf  Skin:  Positive for wound. Negative for color change.  Neurological:  Negative for tremors and headaches.  Psychiatric/Behavioral:  Negative for behavioral problems and sleep disturbance. The patient is not nervous/anxious.     Immunization History  Administered Date(s) Administered   Influenza, High Dose Seasonal PF 10/06/2016, 10/05/2017, 09/14/2018, 08/17/2022, 08/31/2023   Influenza,inj,Quad PF,6+ Mos 10/02/2014   Moderna Covid-19 Vaccine Bivalent Booster 19yrs & up 08/17/2023   PNEUMOCOCCAL CONJUGATE-20 03/16/2023   PPD Test 02/22/2023   Pneumococcal-Unspecified 03/29/2022   Tdap 09/29/2022, 09/06/2023   Unspecified SARS-COV-2 Vaccination 11/05/2019, 12/03/2019, 03/31/2021, 07/21/2021, 03/19/2022   Zoster Recombinant(Shingrix) 03/07/2023   Pertinent  Health Maintenance Due  Topic Date Due   FOOT EXAM  Never done   OPHTHALMOLOGY EXAM  Never done   HEMOGLOBIN A1C  08/27/2023   INFLUENZA VACCINE  Completed   DEXA SCAN  Completed      05/19/2023    2:37 PM 06/02/2023    2:49 PM 06/09/2023    8:48 AM 07/08/2023   10:35 AM 07/29/2023    9:55 AM  Fall Risk  Falls in the past year? 1 1 1 1  0  Was there an injury with Fall? 1 1 1 1  0  Fall Risk Category Calculator 3 3 3 3  0  Patient at Risk for Falls Due to  Impaired balance/gait;History of fall(s) History of fall(s);Impaired balance/gait History of fall(s);Impaired balance/gait;Impaired mobility History of fall(s);Impaired balance/gait;Impaired mobility  Fall risk Follow up Falls evaluation completed Falls evaluation completed Falls evaluation completed Falls evaluation completed Falls evaluation completed   Functional Status Survey:    Vitals:   10/10/23 1219  BP: 129/62  Pulse: 67  Resp: 18  Temp: (!) 97.4 F (36.3 C)  SpO2: 97%  Weight: 127 lb 4.8 oz (57.7 kg)  Height: 5\' 6"  (1.676 m)   Body mass index is 20.55 kg/m. Physical Exam Vitals and nursing note  reviewed.  Constitutional:      Appearance: Normal appearance.  HENT:     Head: Normocephalic and atraumatic.     Nose: Nose normal.     Mouth/Throat:     Mouth: Mucous membranes are moist.  Eyes:     Extraocular Movements: Extraocular movements intact.     Conjunctiva/sclera: Conjunctivae normal.     Pupils:  Pupils are equal, round, and reactive to light.  Cardiovascular:     Rate and Rhythm: Normal rate and regular rhythm.     Heart sounds: No murmur heard.    Comments: Pacemaker left upper chest(defibrillator was removed per patient's request in the past) No dorsalis pedis pulses or posterior tibial pulses felt.  Pulmonary:     Effort: Pulmonary effort is normal.     Breath sounds: No rales.  Abdominal:     General: Bowel sounds are normal.     Palpations: Abdomen is soft.     Tenderness: There is no abdominal tenderness.  Musculoskeletal:        General: Tenderness present.     Cervical back: Normal range of motion and neck supple.     Right lower leg: Edema present.     Left lower leg: No edema.     Comments: Lower back, pelvic/thigh pain with movement/weight bearing.  Mild edema RLE  Skin:    General: Skin is warm and dry.     Findings: Erythema present.     Comments: S/p R breat cancer lumpectomy, no apparent edema R arm.   Radiation dermatitis right upper chest/breast, healed.  R lower leg sutures removed, about 2 inches open area lateral RLE incision, no odorous drainage, noted redness, warmth, swelling in the healed surgical areas.   Pain palpated in the lower right calf and with right right foot dorsiflexion, no cord like findings.    Neurological:     General: No focal deficit present.     Mental Status: She is alert and oriented to person, place, and time. Mental status is at baseline.     Gait: Gait abnormal.     Comments: Ambulates with walker.   Psychiatric:        Mood and Affect: Mood normal.        Behavior: Behavior normal.        Thought Content:  Thought content normal.     Labs reviewed: Recent Labs    09/08/23 0615 09/09/23 0559 09/10/23 0530 09/27/23 0000 10/07/23 0000  NA 140 141 138 137 142  K 3.3* 4.3 3.8 4.1 4.2  CL 106 108 102 99 105  CO2 24 25 25  28* 28*  GLUCOSE 199* 143* 181*  --   --   BUN 25* 23 22 21 17   CREATININE 0.93 0.94 0.92 1.0 0.9  CALCIUM 8.5* 8.7* 8.3* 8.6* 8.5*  MG 2.0 2.1 2.2  --   --    Recent Labs    12/08/22 0801 02/25/23 0000 09/27/23 0000 09/28/23 0000 10/07/23 0000  AST 19 16  --  15 20  ALT 13 10  --  16 16  ALKPHOS 67 62  --  147* 103  BILITOT 0.8  --   --   --   --   PROT 6.6  --   --   --   --   ALBUMIN 4.0 3.8 3.3*  --  3.3*   Recent Labs    02/25/23 0000 09/06/23 2148 09/08/23 0615 09/09/23 0559 09/10/23 0530 09/28/23 0000 10/07/23 0000  WBC 5.3   < > 12.5* 11.9* 8.1 4.9 4.6  NEUTROABS 3,546.00  --   --   --   --  3,244.00 2,838.00  HGB 12.9   < > 11.3* 11.6* 10.5* 11.0* 11.6*  HCT 38   < > 33.4* 34.9* 31.9* 34* 35*  MCV  --    < > 95.4 97.5 95.8  --   --  PLT 179   < > 137* 136* 119* 138* 145*   < > = values in this interval not displayed.   Lab Results  Component Value Date   TSH 1.93 02/25/2023   Lab Results  Component Value Date   HGBA1C 7.5 02/25/2023   Lab Results  Component Value Date   CHOL 116 02/25/2023   HDL 35 02/25/2023   LDLCALC 58 02/25/2023   TRIG 144 02/25/2023   CHOLHDL 2.7 11/10/2020    Significant Diagnostic Results in last 30 days:  No results found.  Assessment/Plan Right calf pain r c/o the right calf pain, redness/warmth medical and lateral lower leg above the right ankle in surgical incision areas, lateral RLE a non healing incision about 2 inch in length, no s/s of infection. Mild swelling RLE, dorsalis pedis and posterior tibial pulses not felt.  Venous US RLE to r/o DVT Apply Voltaren gel to the redness/warmth healed surgical sites away from open area 10/11/23 venous US no acute DVT.   Type 2 diabetes mellitus with  renal complication (HCC) T2DM/renal complication, taking insulin Toujeo 36/33u sq daily since 10/07/23,  insulin Aspart 7/5u if CBG>201 10/05/23, Hgb A1c 7.5 02/25/23<<9.3 10/06/23  Sigmoid diverticulitis Sigmoid diverticulitis/IBS taking Bentyl, Imodium  Hyperlipidemia associated with type 2 diabetes mellitus (HCC) HLD, taking Atorvastatin, LDL 58 02/24/23  SYSTOLIC HEART FAILURE, CHRONIC Compensated,  taking Furosemide, Bun/creat 17/0.85 10/06/23  HTN (hypertension) Blood pressure is controlled, taking Metoprolol, Olmesartan  Osteopenia after menopause OP 03/03/23 DEXA t score -1.236, on Ca, Vit D, on Fosamax 70mg  qwk      Family/ staff Communication: plan of care reviewed with the patient and charge nurse.   Labs/tests ordered:  venous US RLE  Time spend 30 minutes.

## 2023-10-10 NOTE — Assessment & Plan Note (Signed)
T2DM/renal complication, taking insulin Toujeo 36/33u sq daily since 10/07/23,  insulin Aspart 7/5u if CBG>201 10/05/23, Hgb A1c 7.5 02/25/23<<9.3 10/06/23

## 2023-10-10 NOTE — Assessment & Plan Note (Signed)
r c/o the right calf pain, redness/warmth medical and lateral lower leg above the right ankle in surgical incision areas, lateral RLE a non healing incision about 2 inch in length, no s/s of infection. Mild swelling RLE, dorsalis pedis and posterior tibial pulses not felt.  Venous US RLE to r/o DVT Apply Voltaren gel to the redness/warmth healed surgical sites away from open area 10/11/23 venous US no acute DVT.

## 2023-10-10 NOTE — Assessment & Plan Note (Signed)
Compensated,  taking Furosemide, Bun/creat 17/0.85 10/06/23

## 2023-10-10 NOTE — Assessment & Plan Note (Signed)
HLD, taking Atorvastatin, LDL 58 02/24/23

## 2023-10-10 NOTE — Assessment & Plan Note (Signed)
Blood pressure is controlled,  taking Metoprolol, Olmesartan 

## 2023-10-10 NOTE — Assessment & Plan Note (Signed)
OP 03/03/23 DEXA t score -1.236, on Ca, Vit D, on Fosamax 70mg  qwk

## 2023-10-12 ENCOUNTER — Telehealth: Payer: Self-pay

## 2023-10-12 NOTE — Progress Notes (Signed)
EPIC Encounter for ICM Monitoring  Patient Name: Grace Robinson is a 89 y.o. female Date: 10/12/2023 Primary Care Physican: Mast, Man X, NP Primary Cardiologist: Graciela Husbands Electrophysiologist: Joycelyn Schmid Pacing:  98.2%     12/24/2022 Weight:  130 lbs   Clinical Status  Since 30-Sep-2023 Time in AT/AF  <0.1 hr/day (<0.1%)            Attempted call to patient and unable to reach.  Left detailed message per DPR regarding transmission. Transmission reviewed.    Optivol Thoracic impedance suggesting normal fluid levels with the exception of possible dryness from 11/10-12/1.   Prescribed:  Furosemide 20 mg take 1 tablet (20 mg total) by mouth daily alternating (40 mg) every other day.   Potassium 10 mEq 1 tablet twice a day.   Labs: 10/07/2023 Creatinine 0.9, BUN 17, Potassium 4.2, Sodium 142  09/27/2023 Creatinine 1.0, BUN 21, Potassium 4.1, Sodium 137  09/10/2023 Creatinine 0.92, BUN 22, Potassium 3.8, Sodium 138  A complete set of results can be found in Results Review.   Recommendations:  Left voice mail with ICM number and encouraged to call if experiencing any fluid symptoms.   Follow-up plan: ICM clinic phone appointment on 11/14/2023.   91 day device clinic remote transmission 11/30/2023.      EP/Cardiology Office Visits:  EP scheduling attempted calls to patient to make recall appt with Dr Graciela Husbands.   Recall 09/03/2023 with Dr Graciela Husbands.     Copy of ICM check sent to Dr. Graciela Husbands.    3 month ICM trend: 10/10/2023.    12-14 Month ICM trend:     Karie Soda, RN 10/12/2023 9:50 AM

## 2023-10-12 NOTE — Telephone Encounter (Signed)
Remote ICM transmission received.  Attempted call to patient regarding ICM remote transmission and no answer or voice mail option.  

## 2023-10-21 ENCOUNTER — Encounter: Payer: Self-pay | Admitting: Sports Medicine

## 2023-10-21 ENCOUNTER — Non-Acute Institutional Stay (INDEPENDENT_AMBULATORY_CARE_PROVIDER_SITE_OTHER): Payer: Medicare Other | Admitting: Sports Medicine

## 2023-10-21 DIAGNOSIS — W19XXXA Unspecified fall, initial encounter: Secondary | ICD-10-CM | POA: Diagnosis not present

## 2023-10-21 DIAGNOSIS — S82891A Other fracture of right lower leg, initial encounter for closed fracture: Secondary | ICD-10-CM

## 2023-10-21 NOTE — Progress Notes (Unsigned)
Location:  [facility] Room Number: 23-A Place of Service:  SNF (31) Provider:  Jakiya Bookbinder,MD  Mast, Man X, NP  Patient Care Team: Mast, Man X, NP as PCP - General (Internal Medicine) Abigail Miyamoto, MD as Consulting Physician (General Surgery) Rachel Moulds, MD as Consulting Physician (Hematology and Oncology) Dorothy Puffer, MD as Consulting Physician (Radiation Oncology)  Extended Emergency Contact Information Primary Emergency Contact: [name] Address: [address]          [place], [place] [address] [place] Home Phone: [phone] Mobile Phone: [phone] Relation: Son  Code Status:  DNR Goals of care: Advanced Directive information    10/21/2023    2:39 PM  Advanced Directives  Does Patient Have a Medical Advance Directive? Yes  Type of Advance Directive Out of facility DNR (pink MOST or yellow form)  Does patient want to make changes to medical advance directive? No - Patient declined  Pre-existing out of facility DNR order (yellow form or pink MOST form) Yellow form placed in chart (order not valid for inpatient use)     Chief Complaint  Patient presents with   Acute Visit    Fall    HPI:  Pt is a 89 y.o. female seen today for an acute visit for fall  Pt seen and examined in her room, she reports that she was trying to get up from her bed, she was holding the railing at the edge of her bed, it was not steady, she lost her balance and fell on the floor.  Pt denies being dizzy or lightheaded.  Denies pain , chest pain, palpitations, SOB, abdominal pain, nausea, vomiting, dysuria, hematuria, bloody or dark stools.  Rt ankle fracture -  Followed with ortho Pt is able to transfer herself and ambulate with walker  Denies pain in her ankle    Past Medical History:  Diagnosis Date   Biventricular ICD (implantable cardiac defibrillator) in place    downgraded from CRT-D to CRT-P 08/30/22    Breast cancer (HCC)    Cellulitis    Change in bowel habits    CHF (congestive heart failure) (HCC)    Colon polyps    Diabetes mellitus    Type II   Diarrhea    Dyslipidemia    HOH (hard of hearing)    left   HTN (hypertension)    LBBB (left bundle branch block)    chronic   Meniere's disease    Nonischemic cardiomyopathy (HCC) 2006   Presence of permanent cardiac pacemaker    T wave oversensing 01/17/2014   Ulcerative colitis    Varicose veins    4 lazer  treatment each leg   Past Surgical History:  Procedure Laterality Date   ABDOMINAL HYSTERECTOMY     BIV PACEMAKER GENERATOR CHANGEOUT N/A 08/30/2022   Procedure: BIV PACEMAKER GENERATOR CHANGEOUT;  Surgeon: Duke Salvia, MD;  Location: [facility] INVASIVE CV LAB;  Service: Cardiovascular;  Laterality: N/A;   BREAST LUMPECTOMY WITH RADIOACTIVE SEED LOCALIZATION Right 01/05/2023   Procedure: RIGHT BREAST BRACKETED LUMPECTOMY WITH RADIOACTIVE SEED LOCALIZATION;  Surgeon: Abigail Miyamoto, MD;  Location: [facility] OR;  Service: General;  Laterality: Right;   CARDIAC CATHETERIZATION  08/2011   CAST APPLICATION Right 09/06/2023   Procedure: CAST APPLICATION;  Surgeon: Tarry Kos, MD;  Location: [facility] OR;  Service: Orthopedics;  Laterality: Right;   CHOLECYSTECTOMY     COLONOSCOPY     EP Study  08/22/08   I &  D EXTREMITY Right 09/06/2023   Procedure: IRRIGATION AND DEBRIDEMENT RIGHT ANKLE;  Surgeon: Tarry Kos, MD;  Location: [facility] OR;  Service: Orthopedics;  Laterality: Right;   IMPLANTABLE CARDIOVERTER DEFIBRILLATOR GENERATOR CHANGE N/A 08/22/2013   Procedure: IMPLANTABLE CARDIOVERTER DEFIBRILLATOR GENERATOR CHANGE;  Surgeon: Duke Salvia, MD;  Location: [facility] CATH LAB;  Service: Cardiovascular;  Laterality: N/A;   KNEE ARTHROSCOPY Left 04/14/2016   Procedure: LEFT KNEE ARTHROSCOPY, PARTIAL MEDIAL AND PARTIAL LATERAL MENISCECTOMY, MEDIAL PLICA;  Surgeon: Jodi Geralds, MD;  Location: [facility] OR;  Service: Orthopedics;  Laterality: Left;   mastoid  sugery     in the setting of Meniere's disease   ORIF ANKLE FRACTURE Right 09/09/2023   Procedure: OPEN REDUCTION INTERNAL FIXATION (ORIF) ANKLE FRACTURE;  Surgeon: [name], MD;  Location[facility];  Service: Orthopedics;  Laterality: Right;   TONSILLECTOMY AND ADENOIDECTOMY      Allergies  Allergen Reactions   Coreg [Carvedilol] Other (See Comments)    Unknown reaction   Firvanq [Vancomycin] Itching and Other (See Comments)    Red Man Syndrome   Lactose Intolerance (Gi)    Lanoxin [Digoxin] Other (See Comments)    DIG toxicity   Sudafed [Pseudoephedrine] Other (See Comments)    Makes skin feel like its crawling     Outpatient Encounter Medications as of 10/21/2023  Medication Sig   acetaminophen (TYLENOL) 500 MG tablet Take 500 mg by mouth every 8 (eight) hours as needed for headache.   alendronate (FOSAMAX) 70 MG tablet Take 1 tablet (70 mg total) by mouth every 7 (seven) days. Take with a full glass of water on an empty stomach.   aspirin EC 81 MG tablet Take 81 mg by mouth daily.   atorvastatin (LIPITOR) 40 MG tablet Take 40 mg by mouth daily.   diclofenac Sodium (VOLTAREN ARTHRITIS PAIN) 1 % GEL Apply to R ankle topically four times a day for Reddened areas APPLY AWAY FROM OPENED AREAS   furosemide (LASIX) 20 MG tablet Take 1 tablet (20 mg total) by mouth daily. alternating (40 mg) every other day.   INSULIN ASPART IJ Inject as per sliding scale: if 0 - 201 = 0; 202+ = 7, subcutaneously before meals   insulin glargine, 2 Unit Dial, (TOUJEO MAX SOLOSTAR) 300 UNIT/ML Solostar Pen Inject 30 Units into the skin daily. (Patient taking differently: Inject 35 Units into the skin daily.)   metoprolol succinate (TOPROL-XL) 100 MG 24 hr tablet Take 100 mg by mouth 2 (two) times daily.   olmesartan (BENICAR) 40 MG tablet Take 40 mg by mouth daily.   oxyCODONE (OXY IR/ROXICODONE) 5 MG immediate release tablet Take 1 tablet (5 mg total) by mouth every 4 (four) hours as needed for moderate  pain (pain score 4-6) (pain score 4-6).   polyethylene glycol (MIRALAX / GLYCOLAX) 17 g packet Take 17 g by mouth daily as needed for mild constipation.   potassium chloride (K-DUR) 10 MEQ tablet Take 10 mEq by mouth 2 (two) times daily.   Continuous Glucose Receiver (DEXCOM G6 RECEIVER) DEVI 1 Device by Does not apply route as directed. (Patient not taking: Reported on 10/21/2023)   Continuous Glucose Sensor (DEXCOM G6 SENSOR) MISC 1 Device by Does not apply route as directed. (Patient not taking: Reported on 10/21/2023)   Continuous Glucose Transmitter (DEXCOM G6 TRANSMITTER) MISC 1 Device by Does not apply route as directed. (Patient not taking: Reported on 10/21/2023)   dicyclomine (BENTYL) 10 MG capsule TAKE 1 CAPSULE BY MOUTH THREE  TIMES DAILY 30 MINUTES BEFORE MEALS (Patient not taking: Reported on 10/10/2023)   No facility-administered encounter medications on file as of 10/21/2023.    Review of Systems  Constitutional:  Negative for fever.  Respiratory:  Negative for cough, shortness of breath and wheezing.   Cardiovascular:  Negative for chest pain, palpitations and leg swelling.  Gastrointestinal:  Negative for abdominal distention, abdominal pain, blood in stool, constipation, diarrhea, nausea and vomiting.  Genitourinary:  Negative for dysuria, frequency and urgency.  Neurological:  Negative for dizziness, weakness and numbness.  Psychiatric/Behavioral:  Negative for confusion.     Immunization History  Administered Date(s) Administered   Influenza, High Dose Seasonal PF 10/06/2016, 10/05/2017, 09/14/2018, 08/17/2022, 08/31/2023   Influenza,inj,Quad PF,6+ Mos 10/02/2014   Moderna Covid-19 Vaccine Bivalent Booster 37yrs & up 08/17/2023   PNEUMOCOCCAL CONJUGATE-20 03/16/2023   PPD Test 02/22/2023   Pneumococcal-Unspecified 03/29/2022   Tdap 09/29/2022, 09/06/2023   Unspecified SARS-COV-2 Vaccination 11/05/2019, 12/03/2019, 03/31/2021, 07/21/2021, 03/19/2022   Zoster  Recombinant(Shingrix) 03/07/2023   Pertinent  Health Maintenance Due  Topic Date Due   FOOT EXAM  Never done   OPHTHALMOLOGY EXAM  Never done   HEMOGLOBIN A1C  08/27/2023   INFLUENZA VACCINE  Completed   DEXA SCAN  Completed      05/19/2023    2:37 PM 06/02/2023    2:49 PM 06/09/2023    8:48 AM 07/08/2023   10:35 AM 07/29/2023    9:55 AM  Fall Risk  Falls in the past year? 1 1 1 1  0  Was there an injury with Fall? 1 1 1 1  0  Fall Risk Category Calculator 3 3 3 3  0  Patient at Risk for Falls Due to  Impaired balance/gait;History of fall(s) History of fall(s);Impaired balance/gait History of fall(s);Impaired balance/gait;Impaired mobility History of fall(s);Impaired balance/gait;Impaired mobility  Fall risk Follow up Falls evaluation completed Falls evaluation completed Falls evaluation completed Falls evaluation completed Falls evaluation completed   Functional Status Survey:    Vitals:   10/21/23 1428  BP: 135/74  Pulse: 76  Resp: 18  Temp: 97.8 F (36.6 C)  SpO2: 96%  Weight: 127 lb 4.8 oz (57.7 kg)  Height: 5\' 6"  (1.676 m)   Body mass index is 20.55 kg/m. Physical Exam Constitutional:      Appearance: Normal appearance.  HENT:     Head: Normocephalic and atraumatic.  Cardiovascular:     Rate and Rhythm: Normal rate and regular rhythm.  Pulmonary:     Effort: Pulmonary effort is normal. No respiratory distress.     Breath sounds: Normal breath sounds. No wheezing.  Abdominal:     General: Bowel sounds are normal. There is no distension.     Tenderness: There is no abdominal tenderness. There is no guarding or rebound.     Comments:    Musculoskeletal:        General: Swelling present. No tenderness.     Comments: Rt ankle wrapped  Rt leg swollen 2+ pitting odema  Neurological:     Mental Status: She is alert. Mental status is at baseline.     Sensory: No sensory deficit.     Motor: No weakness.     Labs reviewed: Recent Labs    09/08/23 0615 09/09/23 0559  09/10/23 0530 09/27/23 0000 10/07/23 0000  NA 140 141 138 137 142  K 3.3* 4.3 3.8 4.1 4.2  CL 106 108 102 99 105  CO2 24 25 25  28* 28*  GLUCOSE 199* 143* 181*  --   --  BUN 25* 23 22 21 17   CREATININE 0.93 0.94 0.92 1.0 0.9  CALCIUM 8.5* 8.7* 8.3* 8.6* 8.5*  MG 2.0 2.1 2.2  --   --    Recent Labs    12/08/22 0801 02/25/23 0000 09/27/23 0000 09/28/23 0000 10/07/23 0000  AST 19 16  --  15 20  ALT 13 10  --  16 16  ALKPHOS 67 62  --  147* 103  BILITOT 0.8  --   --   --   --   PROT 6.6  --   --   --   --   ALBUMIN 4.0 3.8 3.3*  --  3.3*   Recent Labs    02/25/23 0000 09/06/23 2148 09/08/23 0615 09/09/23 0559 09/10/23 0530 09/28/23 0000 10/07/23 0000  WBC 5.3   < > 12.5* 11.9* 8.1 4.9 4.6  NEUTROABS 3,546.00  --   --   --   --  3,244.00 2,838.00  HGB 12.9   < > 11.3* 11.6* 10.5* 11.0* 11.6*  HCT 38   < > 33.4* 34.9* 31.9* 34* 35*  MCV  --    < > 95.4 97.5 95.8  --   --   PLT 179   < > 137* 136* 119* 138* 145*   < > = values in this interval not displayed.   Lab Results  Component Value Date   TSH 1.93 02/25/2023   Lab Results  Component Value Date   HGBA1C 7.5 02/25/2023   Lab Results  Component Value Date   CHOL 116 02/25/2023   HDL 35 02/25/2023   LDLCALC 58 02/25/2023   TRIG 144 02/25/2023   CHOLHDL 2.7 11/10/2020    Significant Diagnostic Results in last 30 days:  No results found.  Assessment/Plan 1. Fall, initial encounter (Primary) Mechanical fall  Pt denies pain Able to ambulate with walker Cont with PT, OT   2. Closed fracture of right ankle, initial encounter Denies pain  Cont with oxycodone prn for pain  Follow up with ortho   Other orders - INSULIN ASPART IJ; Inject as per sliding scale: if 0 - 201 = 0; 202+ = 7, subcutaneously before meals - diclofenac Sodium (VOLTAREN ARTHRITIS PAIN) 1 % GEL; Apply to R ankle topically four times a day for Reddened areas APPLY AWAY FROM OPENED AREAS   Family/ staff Communication: care plan  discussed with the nursing staff   30 min Total time spent for obtaining history,  performing a medically appropriate examination and evaluation, reviewing the tests,ordering  tests,  documenting clinical information in the electronic or other health record, independently interpreting results ,care coordination (not separately reported)

## 2023-10-24 ENCOUNTER — Encounter: Payer: Self-pay | Admitting: Sports Medicine

## 2023-11-01 ENCOUNTER — Other Ambulatory Visit (INDEPENDENT_AMBULATORY_CARE_PROVIDER_SITE_OTHER): Payer: Self-pay

## 2023-11-01 ENCOUNTER — Ambulatory Visit: Payer: Medicare Other | Admitting: Orthopedic Surgery

## 2023-11-01 DIAGNOSIS — Z9889 Other specified postprocedural states: Secondary | ICD-10-CM | POA: Diagnosis not present

## 2023-11-01 DIAGNOSIS — Z8781 Personal history of (healed) traumatic fracture: Secondary | ICD-10-CM

## 2023-11-03 ENCOUNTER — Non-Acute Institutional Stay (SKILLED_NURSING_FACILITY): Payer: Medicare Other | Admitting: Nurse Practitioner

## 2023-11-03 ENCOUNTER — Encounter: Payer: Self-pay | Admitting: Nurse Practitioner

## 2023-11-03 DIAGNOSIS — E1122 Type 2 diabetes mellitus with diabetic chronic kidney disease: Secondary | ICD-10-CM | POA: Diagnosis not present

## 2023-11-03 DIAGNOSIS — R200 Anesthesia of skin: Secondary | ICD-10-CM | POA: Insufficient documentation

## 2023-11-03 DIAGNOSIS — M1990 Unspecified osteoarthritis, unspecified site: Secondary | ICD-10-CM | POA: Diagnosis not present

## 2023-11-03 DIAGNOSIS — Z794 Long term (current) use of insulin: Secondary | ICD-10-CM

## 2023-11-03 DIAGNOSIS — I5022 Chronic systolic (congestive) heart failure: Secondary | ICD-10-CM

## 2023-11-03 DIAGNOSIS — N183 Chronic kidney disease, stage 3 unspecified: Secondary | ICD-10-CM

## 2023-11-03 DIAGNOSIS — Z66 Do not resuscitate: Secondary | ICD-10-CM

## 2023-11-03 NOTE — Assessment & Plan Note (Signed)
 taking Furosemide, Bun/creat 17/0.85 10/06/23

## 2023-11-03 NOTE — Progress Notes (Signed)
 Location:  [facility] Room Number: 023-A Place of Service:  SNF (31) Provider:  Shaconda Hajduk X,NP  Azaylea Maves X, NP  Patient Care Team: Michiel Sivley X, NP as PCP - General (Internal Medicine) Vernetta Berg, MD as Consulting Physician (General Surgery) Loretha Ash, MD as Consulting Physician (Hematology and Oncology) Dewey Rush, MD as Consulting Physician (Radiation Oncology)  Extended Emergency Contact Information Primary Emergency Contact: Drury Fairy BRAVO Address: [address]          [place], [place] [address] [place] Home Phone: [phone] Mobile Phone: [phone] Relation: Son  Code Status:  DNR Goals of care: Advanced Directive information    11/03/2023   10:59 AM  Advanced Directives  Does Patient Have a Medical Advance Directive? Yes  Type of Advance Directive Out of facility DNR (pink MOST or yellow form)  Does patient want to make changes to medical advance directive? No - Patient declined  Pre-existing out of facility DNR order (yellow form or pink MOST form) Yellow form placed in chart (order not valid for inpatient use)     Chief Complaint  Patient presents with   Acute Visit    Right hand numbness.    HPI:  Pt is a 89 y.o. female seen today for an acute visit for reported the right hand numbness while in f/u Ortho, lasted a few minutes, resolved w/o intervention 11/01/23, VS and Neuro checks benign so far. The patient is in her usual state of health, smiling and conversing upon my examination, no noted focal weakness.   R ankle fx, healed, last f/u Ortho 11/01/23 WBAT   Healed multiple fractures sustained from fall 02/18/23: pelvic fractures: nondisplaced fxs of R superior and inferior pubic rami, minimally displaced fx of the inferior R sacral ala extended to the R SIJ. Mild compression fx of superior endplates of T12, L2. Prn Tylenol   Fall, mechanical T2DM/renal complication, taking insulin  Toujeo  36/33u sq  daily since 10/07/23,  insulin  Aspart 7/5u if CBG>201 10/05/23, Hgb A1c 7.5 02/25/23<<9.3 10/06/23 Mild bilateral hydronephrosis, no stones Sigmoid diverticulitis/IBS taking Bentyl , Imodium  Aortic atherosclerosis, taking ASA, Atorvastatin  HLD, taking Atorvastatin , LDL 58 02/24/23 Vit B12 deficiency, takes Vit B12, Hgb 11.6 10/06/23 Vit D deficiency, on Vit D, Vit D 41 02/24/23 CHF, taking Furosemide , Bun/creat 17/0.85 10/06/23 HTN, taking Metoprolol , Olmesartan  OP 03/03/23 DEXA t score -1.236, on Ca, Vit D, on Fosamax  70mg  qwk              S/p R breat cancer lumpectomy, f/u surgeon, 03/15/23 cancer center radiation tx             Radiation dermatitis right upper chest/breast, healed.       Past Medical History:  Diagnosis Date   Biventricular ICD (implantable cardiac defibrillator) in place    downgraded from CRT-D to CRT-P 08/30/22   Breast cancer (HCC)    Cellulitis    Change in bowel habits    CHF (congestive heart failure) (HCC)    Colon polyps    Diabetes mellitus    Type II   Diarrhea    Dyslipidemia    HOH (hard of hearing)    left   HTN (hypertension)    LBBB (left bundle branch block)    chronic   Meniere's disease    Nonischemic cardiomyopathy (HCC) 2006   Presence of permanent cardiac pacemaker    T wave oversensing 01/17/2014   Ulcerative colitis    Varicose veins    4 lazer  treatment each leg   Past Surgical History:  Procedure Laterality Date   ABDOMINAL HYSTERECTOMY     BIV PACEMAKER GENERATOR CHANGEOUT N/A 08/30/2022   Procedure: BIV PACEMAKER GENERATOR CHANGEOUT;  Surgeon: Fernande Elspeth BROCKS, MD;  Location: [facility] INVASIVE CV LAB;  Service: Cardiovascular;  Laterality: N/A;   BREAST LUMPECTOMY WITH RADIOACTIVE SEED LOCALIZATION Right 01/05/2023   Procedure: RIGHT BREAST BRACKETED LUMPECTOMY WITH RADIOACTIVE SEED LOCALIZATION;  Surgeon: Vernetta Berg, MD;  Location: [facility] OR;  Service: General;  Laterality: Right;   CARDIAC CATHETERIZATION  08/2011   CAST APPLICATION  Right 09/06/2023   Procedure: CAST APPLICATION;  Surgeon: Jerri Kay HERO, MD;  Location: [facility] OR;  Service: Orthopedics;  Laterality: Right;   CHOLECYSTECTOMY     COLONOSCOPY     EP Study  08/22/08   I & D EXTREMITY Right 09/06/2023   Procedure: IRRIGATION AND DEBRIDEMENT RIGHT ANKLE;  Surgeon: Jerri Kay HERO, MD;  Location: [facility] OR;  Service: Orthopedics;  Laterality: Right;   IMPLANTABLE CARDIOVERTER DEFIBRILLATOR GENERATOR CHANGE N/A 08/22/2013   Procedure: IMPLANTABLE CARDIOVERTER DEFIBRILLATOR GENERATOR CHANGE;  Surgeon: Elspeth BROCKS Fernande, MD;  Location: [facility] CATH LAB;  Service: Cardiovascular;  Laterality: N/A;   KNEE ARTHROSCOPY Left 04/14/2016   Procedure: LEFT KNEE ARTHROSCOPY, PARTIAL MEDIAL AND PARTIAL LATERAL MENISCECTOMY, MEDIAL PLICA;  Surgeon: Norleen Gavel, MD;  Location: [facility] OR;  Service: Orthopedics;  Laterality: Left;   mastoid sugery     in the setting of Meniere's disease   ORIF ANKLE FRACTURE Right 09/09/2023   Procedure: OPEN REDUCTION INTERNAL FIXATION (ORIF) ANKLE FRACTURE;  Surgeon: Harden Jerona GAILS, MD;  Location: [facility] OR;  Service: Orthopedics;  Laterality: Right;   TONSILLECTOMY AND ADENOIDECTOMY      Allergies  Allergen Reactions   Coreg [Carvedilol] Other (See Comments)    Unknown reaction   Firvanq  [Vancomycin ] Itching and Other (See Comments)    Red Decarlo Rivet Syndrome   Lactose Intolerance (Gi)    Lanoxin [Digoxin] Other (See Comments)    DIG toxicity   Sudafed [Pseudoephedrine] Other (See Comments)    Makes skin feel like its crawling     Outpatient Encounter Medications as of 11/03/2023  Medication Sig   acetaminophen  (TYLENOL ) 500 MG tablet Take 500 mg by mouth every 8 (eight) hours as needed for headache.   alendronate  (FOSAMAX ) 70 MG tablet Take 1 tablet (70 mg total) by mouth every 7 (seven) days. Take with a full glass of water on an empty stomach.   aspirin  EC 81 MG tablet Take 81 mg by mouth daily.   atorvastatin  (LIPITOR) 40 MG tablet Take 40 mg by mouth daily.    diclofenac  Sodium (VOLTAREN  ARTHRITIS PAIN) 1 % GEL Apply to R ankle topically four times a day for Reddened areas APPLY AWAY FROM OPENED AREAS   furosemide  (LASIX ) 20 MG tablet Take 1 tablet (20 mg total) by mouth daily. alternating (40 mg) every other day.   INSULIN  ASPART IJ Inject as per sliding scale: if 0 - 201 = 0; 202+ = 7, subcutaneously before meals   insulin  glargine, 2 Unit Dial , (TOUJEO  MAX SOLOSTAR) 300 UNIT/ML Solostar Pen Inject 30 Units into the skin daily. (Patient taking differently: Inject 35 Units into the skin daily.)   metoprolol  succinate (TOPROL -XL) 100 MG 24 hr tablet Take 100 mg by mouth 2 (two) times daily.   olmesartan  (BENICAR ) 40 MG tablet Take 40 mg by mouth daily.   oxyCODONE  (OXY IR/ROXICODONE ) 5 MG immediate release tablet Take 1 tablet (5 mg total)  by mouth every 4 (four) hours as needed for moderate pain (pain score 4-6) (pain score 4-6).   polyethylene glycol (MIRALAX  / GLYCOLAX ) 17 g packet Take 17 g by mouth daily as needed for mild constipation.   potassium chloride  (K-DUR) 10 MEQ tablet Take 10 mEq by mouth 2 (two) times daily.   Continuous Glucose Receiver (DEXCOM G6 RECEIVER) DEVI 1 Device by Does not apply route as directed. (Patient not taking: Reported on 10/21/2023)   Continuous Glucose Sensor (DEXCOM G6 SENSOR) MISC 1 Device by Does not apply route as directed. (Patient not taking: Reported on 10/21/2023)   Continuous Glucose Transmitter (DEXCOM G6 TRANSMITTER) MISC 1 Device by Does not apply route as directed. (Patient not taking: Reported on 10/21/2023)   dicyclomine  (BENTYL ) 10 MG capsule TAKE 1 CAPSULE BY MOUTH THREE TIMES DAILY 30 MINUTES BEFORE MEALS (Patient not taking: Reported on 10/10/2023)   No facility-administered encounter medications on file as of 11/03/2023.    Review of Systems  Constitutional:  Negative for appetite change, fatigue and fever.  HENT:  Positive for hearing loss. Negative for congestion and trouble swallowing.   Eyes:   Negative for visual disturbance.  Respiratory:  Negative for cough, chest tightness and wheezing.        Residual hacking cough   Cardiovascular:  Positive for leg swelling.  Gastrointestinal:  Negative for abdominal pain, constipation and diarrhea.  Genitourinary:  Negative for dysuria and urgency.  Musculoskeletal:  Positive for arthralgias, back pain and gait problem.       Right lower leg pain in the calf  Skin:  Positive for wound. Negative for color change.  Neurological:  Negative for tremors and headaches.  Psychiatric/Behavioral:  Negative for behavioral problems and sleep disturbance. The patient is not nervous/anxious.     Immunization History  Administered Date(s) Administered   Influenza, High Dose Seasonal PF 10/06/2016, 10/05/2017, 09/14/2018, 08/17/2022, 08/31/2023   Influenza,inj,Quad PF,6+ Mos 10/02/2014   Moderna Covid-19 Vaccine Bivalent Booster 29yrs & up 08/17/2023   PNEUMOCOCCAL CONJUGATE-20 03/16/2023   PPD Test 02/22/2023   Pneumococcal-Unspecified 03/29/2022   Tdap 09/29/2022, 09/06/2023   Unspecified SARS-COV-2 Vaccination 11/05/2019, 12/03/2019, 03/31/2021, 07/21/2021, 03/19/2022   Zoster Recombinant(Shingrix) 03/07/2023   Pertinent  Health Maintenance Due  Topic Date Due   FOOT EXAM  Never done   OPHTHALMOLOGY EXAM  Never done   HEMOGLOBIN A1C  08/27/2023   INFLUENZA VACCINE  Completed   DEXA SCAN  Completed      05/19/2023    2:37 PM 06/02/2023    2:49 PM 06/09/2023    8:48 AM 07/08/2023   10:35 AM 07/29/2023    9:55 AM  Fall Risk  Falls in the past year? 1 1 1 1  0  Was there an injury with Fall? 1 1 1 1  0  Fall Risk Category Calculator 3 3 3 3  0  Patient at Risk for Falls Due to  Impaired balance/gait;History of fall(s) History of fall(s);Impaired balance/gait History of fall(s);Impaired balance/gait;Impaired mobility History of fall(s);Impaired balance/gait;Impaired mobility  Fall risk Follow up Falls evaluation completed Falls evaluation completed  Falls evaluation completed Falls evaluation completed Falls evaluation completed   Functional Status Survey:    Vitals:   11/03/23 1056 11/03/23 1109 11/04/23 1144  BP: (!) 148/70 130/70 (!) 142/78  Pulse: 74    Resp: 16    Temp: (!) 96.5 F (35.8 C)    SpO2: 98%    Weight: 131 lb 8 oz (59.6 kg)    Height: 5' 6 (  1.676 m)     Body mass index is 21.22 kg/m. Physical Exam Vitals and nursing note reviewed.  Constitutional:      Appearance: Normal appearance.  HENT:     Head: Normocephalic and atraumatic.     Nose: Nose normal.     Mouth/Throat:     Mouth: Mucous membranes are moist.  Eyes:     Extraocular Movements: Extraocular movements intact.     Conjunctiva/sclera: Conjunctivae normal.     Pupils: Pupils are equal, round, and reactive to light.  Cardiovascular:     Rate and Rhythm: Normal rate and regular rhythm.     Heart sounds: No murmur heard.    Comments: Pacemaker left upper chest(defibrillator was removed per patient's request in the past) No dorsalis pedis pulses or posterior tibial pulses felt.  Pulmonary:     Effort: Pulmonary effort is normal.     Breath sounds: No rales.  Abdominal:     General: Bowel sounds are normal.     Palpations: Abdomen is soft.     Tenderness: There is no abdominal tenderness.  Musculoskeletal:     Cervical back: Normal range of motion and neck supple.     Right lower leg: Edema present.     Left lower leg: No edema.     Comments: Lower back, pelvic/thigh pain with movement/weight bearing.  Mild edema RLE  Skin:    General: Skin is warm and dry.     Findings: Erythema present.     Comments: S/p R breat cancer lumpectomy, no apparent edema R arm.   Radiation dermatitis right upper chest/breast, healed.  R lower leg mild erythema, warmth, better than prior, slow healing surgical wound w/o s/s of infection.    Neurological:     General: No focal deficit present.     Mental Status: She is alert and oriented to person,  place, and time. Mental status is at baseline.     Gait: Gait abnormal.     Comments: Ambulates with walker.   Psychiatric:        Mood and Affect: Mood normal.        Behavior: Behavior normal.        Thought Content: Thought content normal.     Labs reviewed: Recent Labs    09/08/23 0615 09/09/23 0559 09/10/23 0530 09/27/23 0000 10/07/23 0000  NA 140 141 138 137 142  K 3.3* 4.3 3.8 4.1 4.2  CL 106 108 102 99 105  CO2 24 25 25  28* 28*  GLUCOSE 199* 143* 181*  --   --   BUN 25* 23 22 21 17   CREATININE 0.93 0.94 0.92 1.0 0.9  CALCIUM  8.5* 8.7* 8.3* 8.6* 8.5*  MG 2.0 2.1 2.2  --   --    Recent Labs    12/08/22 0801 02/25/23 0000 09/27/23 0000 09/28/23 0000 10/07/23 0000  AST 19 16  --  15 20  ALT 13 10  --  16 16  ALKPHOS 67 62  --  147* 103  BILITOT 0.8  --   --   --   --   PROT 6.6  --   --   --   --   ALBUMIN 4.0 3.8 3.3*  --  3.3*   Recent Labs    02/25/23 0000 09/06/23 2148 09/08/23 0615 09/09/23 0559 09/10/23 0530 09/28/23 0000 10/07/23 0000  WBC 5.3   < > 12.5* 11.9* 8.1 4.9 4.6  NEUTROABS 3,546.00  --   --   --   --  3,244.00 2,838.00  HGB 12.9   < > 11.3* 11.6* 10.5* 11.0* 11.6*  HCT 38   < > 33.4* 34.9* 31.9* 34* 35*  MCV  --    < > 95.4 97.5 95.8  --   --   PLT 179   < > 137* 136* 119* 138* 145*   < > = values in this interval not displayed.   Lab Results  Component Value Date   TSH 1.93 02/25/2023   Lab Results  Component Value Date   HGBA1C 7.5 02/25/2023   Lab Results  Component Value Date   CHOL 116 02/25/2023   HDL 35 02/25/2023   LDLCALC 58 02/25/2023   TRIG 144 02/25/2023   CHOLHDL 2.7 11/10/2020    Significant Diagnostic Results in last 30 days:  No results found.  Assessment/Plan Numbness of right hand  reported the right hand numbness while in f/u Ortho, lasted a few minutes, resolved w/o intervention 11/01/23, VS and Neuro checks benign so far. The patient is in her usual state of health, smiling and conversing upon  my examination, no noted focal weakness.   Osteoarthritis R ankle fx, healed, last f/u Ortho 11/01/23 WBAT   Healed multiple fractures sustained from fall 02/18/23: pelvic fractures: nondisplaced fxs of R superior and inferior pubic rami, minimally displaced fx of the inferior R sacral ala extended to the R SIJ. Mild compression fx of superior endplates of T12, L2. Prn Tylenol   Fall, mechanical  Type 2 diabetes mellitus with renal complication (HCC) T2DM/renal complication, taking insulin  Toujeo  36/33u sq daily since 10/07/23,  insulin  Aspart 7/5u if CBG>201 10/05/23, Hgb A1c 7.5 02/25/23<<9.3 10/06/23  SYSTOLIC HEART FAILURE, CHRONIC  taking Furosemide , Bun/creat 17/0.85 10/06/23  HTN (hypertension) Blood pressure is controlled,  taking Metoprolol , Olmesartan       Family/ staff Communication: plan of care reviewed with the patient and charge nurse.   Labs/tests ordered:  none  Time spend 30 minutes.

## 2023-11-03 NOTE — Assessment & Plan Note (Signed)
Blood pressure is controlled,  taking Metoprolol, Olmesartan 

## 2023-11-03 NOTE — Assessment & Plan Note (Signed)
 reported the right hand numbness while in f/u Ortho, lasted a few minutes, resolved w/o intervention 11/01/23, VS and Neuro checks benign so far. The patient is in her usual state of health, smiling and conversing upon my examination, no noted focal weakness.

## 2023-11-03 NOTE — Assessment & Plan Note (Signed)
 R ankle fx, healed, last f/u Ortho 11/01/23 WBAT   Healed multiple fractures sustained from fall 02/18/23: pelvic fractures: nondisplaced fxs of R superior and inferior pubic rami, minimally displaced fx of the inferior R sacral ala extended to the R SIJ. Mild compression fx of superior endplates of T12, L2. Prn Tylenol   Fall, mechanical

## 2023-11-03 NOTE — Assessment & Plan Note (Signed)
 T2DM/renal complication, taking insulin Toujeo 36/33u sq daily since 10/07/23,  insulin Aspart 7/5u if CBG>201 10/05/23, Hgb A1c 7.5 02/25/23<<9.3 10/06/23

## 2023-11-06 DIAGNOSIS — R2681 Unsteadiness on feet: Secondary | ICD-10-CM | POA: Diagnosis not present

## 2023-11-06 DIAGNOSIS — M6281 Muscle weakness (generalized): Secondary | ICD-10-CM | POA: Diagnosis not present

## 2023-11-06 DIAGNOSIS — R2689 Other abnormalities of gait and mobility: Secondary | ICD-10-CM | POA: Diagnosis not present

## 2023-11-06 DIAGNOSIS — R29898 Other symptoms and signs involving the musculoskeletal system: Secondary | ICD-10-CM | POA: Diagnosis not present

## 2023-11-06 DIAGNOSIS — S82851D Displaced trimalleolar fracture of right lower leg, subsequent encounter for closed fracture with routine healing: Secondary | ICD-10-CM | POA: Diagnosis not present

## 2023-11-06 DIAGNOSIS — R296 Repeated falls: Secondary | ICD-10-CM | POA: Diagnosis not present

## 2023-11-06 DIAGNOSIS — R41841 Cognitive communication deficit: Secondary | ICD-10-CM | POA: Diagnosis not present

## 2023-11-07 ENCOUNTER — Non-Acute Institutional Stay (SKILLED_NURSING_FACILITY): Payer: Medicare Other | Admitting: Sports Medicine

## 2023-11-07 ENCOUNTER — Encounter: Payer: Self-pay | Admitting: Orthopedic Surgery

## 2023-11-07 ENCOUNTER — Encounter: Payer: Self-pay | Admitting: Sports Medicine

## 2023-11-07 DIAGNOSIS — R2689 Other abnormalities of gait and mobility: Secondary | ICD-10-CM | POA: Diagnosis not present

## 2023-11-07 DIAGNOSIS — R2681 Unsteadiness on feet: Secondary | ICD-10-CM | POA: Diagnosis not present

## 2023-11-07 DIAGNOSIS — N183 Chronic kidney disease, stage 3 unspecified: Secondary | ICD-10-CM

## 2023-11-07 DIAGNOSIS — M6281 Muscle weakness (generalized): Secondary | ICD-10-CM | POA: Diagnosis not present

## 2023-11-07 DIAGNOSIS — I5022 Chronic systolic (congestive) heart failure: Secondary | ICD-10-CM

## 2023-11-07 DIAGNOSIS — S82891S Other fracture of right lower leg, sequela: Secondary | ICD-10-CM

## 2023-11-07 DIAGNOSIS — S82851D Displaced trimalleolar fracture of right lower leg, subsequent encounter for closed fracture with routine healing: Secondary | ICD-10-CM | POA: Diagnosis not present

## 2023-11-07 DIAGNOSIS — R296 Repeated falls: Secondary | ICD-10-CM | POA: Diagnosis not present

## 2023-11-07 DIAGNOSIS — R41841 Cognitive communication deficit: Secondary | ICD-10-CM | POA: Diagnosis not present

## 2023-11-07 DIAGNOSIS — R29898 Other symptoms and signs involving the musculoskeletal system: Secondary | ICD-10-CM | POA: Diagnosis not present

## 2023-11-07 DIAGNOSIS — E1122 Type 2 diabetes mellitus with diabetic chronic kidney disease: Secondary | ICD-10-CM | POA: Diagnosis not present

## 2023-11-07 DIAGNOSIS — Z794 Long term (current) use of insulin: Secondary | ICD-10-CM

## 2023-11-07 MED ORDER — FUROSEMIDE 20 MG PO TABS: 20.0000 mg | ORAL_TABLET | ORAL | Status: DC

## 2023-11-07 NOTE — Progress Notes (Signed)
 Office Visit Note   Patient: Grace Robinson           Date of Birth: 02/10/1926           MRN: [id number] Visit Date: 11/01/2023              Requested by: Mast, Man X, NP [address] [place],  [place] [address] PCP: Mast, Man X, NP  Chief Complaint  Patient presents with   Right Ankle - Routine Post Op    09/09/2023 ORIF right ankle fracture      HPI: Patient is a 89 year old woman who is seen for follow-up 2 months status post open reduction internal fixation fracture dislocation right ankle with plating of the fibula and screw fixation of the syndesmosis.  Assessment & Plan: Visit Diagnoses:  1. S/P ORIF (open reduction internal fixation) fracture     Plan: Continue Dial  soap cleansing dry dressing changes weightbearing as tolerated.  Follow-Up Instructions: Return if symptoms worsen or fail to improve.   Ortho Exam  Patient is alert, oriented, no adenopathy, well-dressed, normal affect, normal respiratory effort. Examination there is a wound laterally that is 5 x 10 mm and 1 mm deep that is healing well the medial eschar is stable from the fracture dislocation.  There is no cellulitis no drainage no signs of infection.  Imaging: No results found. No images are attached to the encounter.  Labs: Lab Results  Component Value Date   HGBA1C 7.5 02/25/2023   HGBA1C 7.5 (H) 12/31/2022   HGBA1C 7.4 (H) 11/10/2020   ESRSEDRATE 48 (H) 09/06/2016   CRP 0.1 (L) 09/06/2016     Lab Results  Component Value Date   ALBUMIN 3.3 (A) 10/07/2023   ALBUMIN 3.3 (A) 09/27/2023   ALBUMIN 3.8 02/25/2023    Lab Results  Component Value Date   MG 2.2 09/10/2023   MG 2.1 09/09/2023   MG 2.0 09/08/2023   Lab Results  Component Value Date   VD25OH 41 02/25/2023    No results found for: PREALBUMIN    Latest Ref Rng & Units 10/07/2023   12:00 AM 09/28/2023   12:00 AM 09/10/2023    5:30 AM  CBC EXTENDED  WBC  4.6     4.9     8.1   RBC 3.87 - 5.11 3.57     3.44      3.33   Hemoglobin 12.0 - 16.0 11.6     11.0     10.5   HCT 36 - 46 35     34     31.9   Platelets 150 - 400 K/uL 145     138     119   NEUT#  2,838.00     3,244.00          This result is from an external source.     There is no height or weight on file to calculate BMI.  Orders:  Orders Placed This Encounter  Procedures   XR Ankle Complete Right   No orders of the defined types were placed in this encounter.    Procedures: No procedures performed  Clinical Data: No additional findings.  ROS:  All other systems negative, except as noted in the HPI. Review of Systems  Objective: Vital Signs: There were no vitals taken for this visit.  Specialty Comments:  No specialty comments available.  PMFS History: Patient Active Problem List   Diagnosis Date Noted   Numbness of right hand 11/03/2023  Right calf pain 10/10/2023   Cellulitis 09/26/2023   IBS (irritable bowel syndrome) 09/10/2023   Open trimalleolar fracture of ankle, right, type III, initial encounter 09/09/2023   Hypokalemia 09/08/2023   Dislocation of ankle, right, open, initial encounter 09/07/2023   Open fracture of right distal fibula 09/06/2023   COVID-19 virus infection 06/09/2023   Gait abnormality 05/19/2023   Osteoarthritis 05/03/2023   Radiation dermatitis 05/03/2023   Osteopenia after menopause 03/04/2023   Closed pelvic fracture (HCC) 02/23/2023   Lower back pain 02/23/2023   Hydronephrosis 02/23/2023   Sigmoid diverticulitis 02/23/2023   Aortic atherosclerosis (HCC) 02/23/2023   Hyperlipidemia associated with type 2 diabetes mellitus (HCC) 02/23/2023   Vitamin B12 deficiency 02/23/2023   Vitamin D  deficiency 02/23/2023   Type 2 diabetes mellitus with renal complication (HCC) 02/23/2023   S/P lumpectomy, right breast 01/05/2023   Ductal carcinoma in situ (DCIS) of right breast 12/06/2022   NICM (nonischemic cardiomyopathy) (HCC) 07/30/2021   Degenerative tear of posterior horn of medial  meniscus of left knee 04/14/2016   Tear of lateral meniscus of left knee 04/14/2016   Synovial plica of left knee 04/14/2016   Mechanical complication of implantable cardioverter-defibrillator (ICD) 01/17/2014   Fall 07/10/2013   Paroxysmal ventricular tachycardia (HCC) 12/13/2012   Biventricular implantable cardioverter-defibrillator -[employer]tr[employer] 12/08/2011   Atrial tachycardia (HCC) 12/08/2011   HTN (hypertension) 05/19/2011   CARDIOMYOPATHY, PRIMARY, DILATED 12/01/2010   SYSTOLIC HEART FAILURE, CHRONIC 12/01/2010   Past Medical History:  Diagnosis Date   Biventricular ICD (implantable cardiac defibrillator) in place    downgraded from CRT-D to CRT-P 08/30/22   Breast cancer (HCC)    Cellulitis    Change in bowel habits    CHF (congestive heart failure) (HCC)    Colon polyps    Diabetes mellitus    Type II   Diarrhea    Dyslipidemia    HOH (hard of hearing)    left   HTN (hypertension)    LBBB (left bundle branch block)    chronic   Meniere's disease    Nonischemic cardiomyopathy (HCC) 2006   Presence of permanent cardiac pacemaker    T wave oversensing 01/17/2014   Ulcerative colitis    Varicose veins    4 lazer  treatment each leg    Family History  Problem Relation Age of Onset   Breast cancer Mother 4       met to lungs   Emphysema Father 77   Heart disease Father    Healthy Child    Lung cancer Cousin        maternal first cousin, she smoked   Colon cancer Neg Hx    Rectal cancer Neg Hx    Stomach cancer Neg Hx    Liver cancer Neg Hx    Esophageal cancer Neg Hx     Past Surgical History:  Procedure Laterality Date   ABDOMINAL HYSTERECTOMY     BIV PACEMAKER GENERATOR CHANGEOUT N/A 08/30/2022   Procedure: BIV PACEMAKER GENERATOR CHANGEOUT;  Surgeon: Fernande Elspeth BROCKS, MD;  Location: [facility] INVASIVE CV LAB;  Service: Cardiovascular;  Laterality: N/A;   BREAST LUMPECTOMY WITH RADIOACTIVE SEED LOCALIZATION Right 01/05/2023   Procedure: RIGHT BREAST BRACKETED  LUMPECTOMY WITH RADIOACTIVE SEED LOCALIZATION;  Surgeon: Vernetta Berg, MD;  Location[facility] OR;  Service: General;  Laterality: Right;   CARDIAC CATHETERIZATION  08/2011   CAST APPLICATION Right 09/06/2023   Procedure: CAST APPLICATION;  Surgeon: Jerri Kay HERO, MD;  Location: [facility] OR;  Service: Orthopedics;  Laterality: Right;   CHOLECYSTECTOMY     COLONOSCOPY     EP Study  08/22/08   I & D EXTREMITY Right 09/06/2023   Procedure: IRRIGATION AND DEBRIDEMENT RIGHT ANKLE;  Surgeon: Jerri Kay HERO, MD;  Location: [facility] OR;  Service: Orthopedics;  Laterality: Right;   IMPLANTABLE CARDIOVERTER DEFIBRILLATOR GENERATOR CHANGE N/A 08/22/2013   Procedure: IMPLANTABLE CARDIOVERTER DEFIBRILLATOR GENERATOR CHANGE;  Surgeon: Elspeth JAYSON Sage, MD;  Location: [facility] CATH LAB;  Service: Cardiovascular;  Laterality: N/A;   KNEE ARTHROSCOPY Left 04/14/2016   Procedure: LEFT KNEE ARTHROSCOPY, PARTIAL MEDIAL AND PARTIAL LATERAL MENISCECTOMY, MEDIAL PLICA;  Surgeon: Norleen Gavel, MD;  Location: [facility] OR;  Service: Orthopedics;  Laterality: Left;   mastoid sugery     in the setting of Meniere's disease   ORIF ANKLE FRACTURE Right 09/09/2023   Procedure: OPEN REDUCTION INTERNAL FIXATION (ORIF) ANKLE FRACTURE;  Surgeon: Harden Jerona GAILS, MD;  Location: [facility] OR;  Service: Orthopedics;  Laterality: Right;   TONSILLECTOMY AND ADENOIDECTOMY     Social History   Occupational History   Occupation: retired    Comment: runner, 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

## 2023-11-07 NOTE — Progress Notes (Signed)
 Location:   [facility] Service:   Skilled care  Provider: Jackalyn Blazing  PCP: Mast, Man X, NP Patient Care Team: Mast, Man X, NP as PCP - General (Internal Medicine) Vernetta Berg, MD as Consulting Physician (General Surgery) Loretha Ash, MD as Consulting Physician (Hematology and Oncology) Dewey Rush, MD as Consulting Physician (Radiation Oncology)  Extended Emergency Contact Information Primary Emergency Contact: Drury Fairy BRAVO Address: [address]          [place], [place] [address] [place] Home Phone: [phone] Mobile Phone: [phone] Relation: Son  Goals of care:  Advanced Directive information    11/03/2023   10:59 AM  Advanced Directives  Does Patient Have a Medical Advance Directive? Yes  Type of Advance Directive Out of facility DNR (pink MOST or yellow form)  Does patient want to make changes to medical advance directive? No - Patient declined  Pre-existing out of facility DNR order (yellow form or pink MOST form) Yellow form placed in chart (order not valid for inpatient use)     Allergies  Allergen Reactions   Coreg [Carvedilol] Other (See Comments)    Unknown reaction   Firvanq  [Vancomycin ] Itching and Other (See Comments)    Red Man Syndrome   Lactose Intolerance (Gi)    Lanoxin [Digoxin] Other (See Comments)    DIG toxicity   Sudafed [Pseudoephedrine] Other (See Comments)    Makes skin feel like its crawling     No chief complaint on file.   HPI:  89 y.o. female with past medical history of diabetes, hyperlipidemia, congestive heart failure, hypertension, osteoporosis, recent right ankle fracture status post ORIF seen today for discharge to assisted living from skilled care. Pt admitted to skilled care for rehab after Rt ankle fracture s/p ORIF   Patient seen and examined in her room.  Patient reports she is doing fine. She had gastroenteritis last week with nausea, vomiting,  diarrhea but reports that her symptoms have resolved. Denies being dizzy, lightheaded. She follows with Ortho last week.  She is off full weightbearing.  Denies pain in her ankle. Patient denies chest pain, shortness of breath, abdominal pain, nausea, vomiting, dysuria, hematuria, bloody and dark stools. She is able to ambulate with the walker      Past Medical History:  Diagnosis Date   Biventricular ICD (implantable cardiac defibrillator) in place    downgraded from CRT-D to CRT-P 08/30/22   Breast cancer (HCC)    Cellulitis    Change in bowel habits    CHF (congestive heart failure) (HCC)    Colon polyps    Diabetes mellitus    Type II   Diarrhea    Dyslipidemia    HOH (hard of hearing)    left   HTN (hypertension)    LBBB (left bundle branch block)    chronic   Meniere's disease    Nonischemic cardiomyopathy (HCC) 2006   Presence of permanent cardiac pacemaker    T wave oversensing 01/17/2014   Ulcerative colitis    Varicose veins    4 lazer  treatment each leg    Past Surgical History:  Procedure Laterality Date   ABDOMINAL HYSTERECTOMY     BIV PACEMAKER GENERATOR CHANGEOUT N/A 08/30/2022   Procedure: BIV PACEMAKER GENERATOR CHANGEOUT;  Surgeon: Fernande Elspeth BROCKS, MD;  Location: [facility] INVASIVE CV LAB;  Service: Cardiovascular;  Laterality: N/A;   BREAST LUMPECTOMY WITH RADIOACTIVE SEED LOCALIZATION Right 01/05/2023   Procedure: RIGHT BREAST BRACKETED  LUMPECTOMY WITH RADIOACTIVE SEED LOCALIZATION;  Surgeon: Vernetta Berg, MD;  Location: [facility] OR;  Service: General;  Laterality: Right;   CARDIAC CATHETERIZATION  08/2011   CAST APPLICATION Right 09/06/2023   Procedure: CAST APPLICATION;  Surgeon: Jerri Kay HERO, MD;  Location: [facility] OR;  Service: Orthopedics;  Laterality: Right;   CHOLECYSTECTOMY     COLONOSCOPY     EP Study  08/22/08   I & D EXTREMITY Right 09/06/2023   Procedure: IRRIGATION AND DEBRIDEMENT RIGHT ANKLE;  Surgeon: Jerri Kay HERO, MD;  Location: [facility] OR;  Service:  Orthopedics;  Laterality: Right;   IMPLANTABLE CARDIOVERTER DEFIBRILLATOR GENERATOR CHANGE N/A 08/22/2013   Procedure: IMPLANTABLE CARDIOVERTER DEFIBRILLATOR GENERATOR CHANGE;  Surgeon: Elspeth JAYSON Sage, MD;  Location: [facility] CATH LAB;  Service: Cardiovascular;  Laterality: N/A;   KNEE ARTHROSCOPY Left 04/14/2016   Procedure: LEFT KNEE ARTHROSCOPY, PARTIAL MEDIAL AND PARTIAL LATERAL MENISCECTOMY, MEDIAL PLICA;  Surgeon: Norleen Gavel, MD;  Location: [facility] OR;  Service: Orthopedics;  Laterality: Left;   mastoid sugery     in the setting of Meniere's disease   ORIF ANKLE FRACTURE Right 09/09/2023   Procedure: OPEN REDUCTION INTERNAL FIXATION (ORIF) ANKLE FRACTURE;  Surgeon: Harden Jerona GAILS, MD;  Location: [facility] OR;  Service: Orthopedics;  Laterality: Right;   TONSILLECTOMY AND ADENOIDECTOMY        reports that she has quit smoking. Her smoking use included cigarettes. She has never been exposed to tobacco smoke. She has never used smokeless tobacco. She reports that she does not drink alcohol and does not use drugs. 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    Social Drivers of Corporate Investment Banker Strain: Low Risk  (12/08/2022)   Overall Financial Resource Strain (CARDIA)    Difficulty of Paying Living Expenses: Not hard at  all  Food Insecurity: No Food Insecurity (09/13/2023)   Hunger Vital Sign    Worried About Running Out of Food in the Last Year: Never true    Ran Out of Food in the Last Year: Never true  Transportation Needs: No Transportation Needs (09/13/2023)   PRAPARE - Administrator, Civil Service (Medical): No    Lack of Transportation (Non-Medical): No  Physical Activity: Not on file  Stress: Not on file  Social Connections: Not on file  Intimate Partner Violence: Not At Risk (09/13/2023)   Humiliation, Afraid, Rape, and Kick questionnaire    Fear of Current or Ex-Partner: No    Emotionally Abused: No    Physically Abused: No    Sexually Abused: No   Functional Status Survey:    Allergies  Allergen Reactions   Coreg [Carvedilol] Other (See Comments)  Unknown reaction   Firvanq  [Vancomycin ] Itching and Other (See Comments)    Red Man Syndrome   Lactose Intolerance (Gi)    Lanoxin [Digoxin] Other (See Comments)    DIG toxicity   Sudafed [Pseudoephedrine] Other (See Comments)    Makes skin feel like its crawling     Pertinent  Health Maintenance Due  Topic Date Due   FOOT EXAM  Never done   OPHTHALMOLOGY EXAM  Never done   HEMOGLOBIN A1C  08/27/2023   INFLUENZA VACCINE  Completed   DEXA SCAN  Completed    Medications: Outpatient Encounter Medications as of 11/07/2023  Medication Sig   acetaminophen  (TYLENOL ) 500 MG tablet Take 500 mg by mouth every 8 (eight) hours as needed for headache.   alendronate  (FOSAMAX ) 70 MG tablet Take 1 tablet (70 mg total) by mouth every 7 (seven) days. Take with a full glass of water on an empty stomach.   aspirin  EC 81 MG tablet Take 81 mg by mouth daily.   atorvastatin  (LIPITOR) 40 MG tablet Take 40 mg by mouth daily.   Continuous Glucose Receiver (DEXCOM G6 RECEIVER) DEVI 1 Device by Does not apply route as directed. (Patient not taking: Reported on 10/21/2023)   Continuous Glucose Sensor (DEXCOM G6 SENSOR) MISC 1 Device by Does  not apply route as directed. (Patient not taking: Reported on 10/21/2023)   Continuous Glucose Transmitter (DEXCOM G6 TRANSMITTER) MISC 1 Device by Does not apply route as directed. (Patient not taking: Reported on 10/21/2023)   diclofenac  Sodium (VOLTAREN  ARTHRITIS PAIN) 1 % GEL Apply to R ankle topically four times a day for Reddened areas APPLY AWAY FROM OPENED AREAS   dicyclomine  (BENTYL ) 10 MG capsule TAKE 1 CAPSULE BY MOUTH THREE TIMES DAILY 30 MINUTES BEFORE MEALS (Patient not taking: Reported on 10/10/2023)   furosemide  (LASIX ) 20 MG tablet Take 1 tablet (20 mg total) by mouth daily. alternating (40 mg) every other day.   INSULIN  ASPART IJ Inject as per sliding scale: if 0 - 201 = 0; 202+ = 7, subcutaneously before meals   insulin  glargine, 2 Unit Dial , (TOUJEO  MAX SOLOSTAR) 300 UNIT/ML Solostar Pen Inject 30 Units into the skin daily. (Patient taking differently: Inject 35 Units into the skin daily.)   metoprolol  succinate (TOPROL -XL) 100 MG 24 hr tablet Take 100 mg by mouth 2 (two) times daily.   olmesartan  (BENICAR ) 40 MG tablet Take 40 mg by mouth daily.   oxyCODONE  (OXY IR/ROXICODONE ) 5 MG immediate release tablet Take 1 tablet (5 mg total) by mouth every 4 (four) hours as needed for moderate pain (pain score 4-6) (pain score 4-6).   polyethylene glycol (MIRALAX  / GLYCOLAX ) 17 g packet Take 17 g by mouth daily as needed for mild constipation.   potassium chloride  (K-DUR) 10 MEQ tablet Take 10 mEq by mouth 2 (two) times daily.   No facility-administered encounter medications on file as of 11/07/2023.    Review of Systems  Constitutional:  Negative for chills and fever.  HENT:  Negative for sinus pressure and sore throat.   Respiratory:  Negative for cough, shortness of breath and wheezing.   Cardiovascular:  Negative for chest pain, palpitations and leg swelling.  Gastrointestinal:  Negative for abdominal distention, abdominal pain, blood in stool, constipation, diarrhea, nausea and  vomiting.  Genitourinary:  Negative for dysuria, frequency and urgency.  Neurological:  Negative for dizziness, weakness and numbness.  Psychiatric/Behavioral:  Negative for confusion.     There were no vitals filed for this  visit. There is no height or weight on file to calculate BMI. Physical Exam Constitutional:      Appearance: Normal appearance.  HENT:     Head: Normocephalic and atraumatic.  Cardiovascular:     Rate and Rhythm: Normal rate and regular rhythm.     Pulses: Normal pulses.     Heart sounds: Normal heart sounds.  Pulmonary:     Effort: No respiratory distress.     Breath sounds: No stridor. No wheezing or rales.  Abdominal:     General: Bowel sounds are normal. There is no distension.     Palpations: Abdomen is soft.     Tenderness: There is no abdominal tenderness. There is no right CVA tenderness or guarding.  Musculoskeletal:        General: Swelling present.     Comments: Rt lower extremity swelling 1+ pitting odema  Neurological:     Mental Status: She is alert. Mental status is at baseline.     Sensory: No sensory deficit.     Motor: No weakness.     Labs reviewed: Basic Metabolic Panel: Recent Labs    09/08/23 0615 09/09/23 0559 09/10/23 0530 09/27/23 0000 10/07/23 0000  NA 140 141 138 137 142  K 3.3* 4.3 3.8 4.1 4.2  CL 106 108 102 99 105  CO2 24 25 25  28* 28*  GLUCOSE 199* 143* 181*  --   --   BUN 25* 23 22 21 17   CREATININE 0.93 0.94 9.07 1.0 0.9  CALCIUM  8.5* 8.7* 8.3* 8.6* 8.5*  MG 2.0 2.1 2.2  --   --    Liver Function Tests: Recent Labs    12/08/22 0801 02/25/23 0000 09/27/23 0000 09/28/23 0000 10/07/23 0000  AST 19 16  --  15 20  ALT 13 10  --  16 16  ALKPHOS 67 62  --  147* 103  BILITOT 0.8  --   --   --   --   PROT 6.6  --   --   --   --   ALBUMIN 4.0 3.8 3.3*  --  3.3*   No results for input(s): LIPASE, AMYLASE in the last 8760 hours. No results for input(s): AMMONIA in the last 8760 hours. CBC: Recent  Labs    02/25/23 0000 09/06/23 2148 09/08/23 0615 09/09/23 0559 09/10/23 0530 09/28/23 0000 10/07/23 0000  WBC 5.3   < > 12.5* 11.9* 8.1 4.9 4.6  NEUTROABS 3,546.00  --   --   --   --  3,244.00 2,838.00  HGB 12.9   < > 11.3* 11.6* 10.5* 11.0* 11.6*  HCT 38   < > 33.4* 34.9* 31.9* 34* 35*  MCV  --    < > 95.4 97.5 95.8  --   --   PLT 179   < > 137* 136* 119* 138* 145*   < > = values in this interval not displayed.   Cardiac Enzymes: No results for input(s): CKTOTAL, CKMB, CKMBINDEX, TROPONINI in the last 8760 hours. BNP: Invalid input(s): POCBNP CBG: Recent Labs    09/13/23 2048 09/14/23 0708 09/14/23 1110  GLUCAP 268* 162* 198*    Procedures and Imaging Studies During Stay: XR Ankle Complete Right Result Date: 11/07/2023 Three-view radiographs of the right ankle shows stable internal fixation of the right ankle with congruent mortise.  Patient has calcified arteries out to the midfoot.    Allergies as of 11/07/2023       Reactions   Coreg [carvedilol] Other (See  Comments)   Unknown reaction   Firvanq  [vancomycin ] Itching, Other (See Comments)   Red Man Syndrome   Lactose Intolerance (gi)    Lanoxin [digoxin] Other (See Comments)   DIG toxicity   Sudafed [pseudoephedrine] Other (See Comments)   Makes skin feel like its crawling        Medication List        Accurate as of November 07, 2023  3:22 PM. If you have any questions, ask your nurse or doctor.          STOP taking these medications    Dexcom G6 Receiver Devi Stopped by: Jackalyn Blazing   Dexcom G6 Sensor Misc Stopped by: Jackalyn Blazing   Dexcom G6 Transmitter Misc Stopped by: Jackalyn Blazing   dicyclomine  10 MG capsule Commonly known as: BENTYL  Stopped by: Jackalyn Blazing       TAKE these medications    acetaminophen  500 MG tablet Commonly known as: TYLENOL  Take 500 mg by mouth every 8 (eight) hours as needed for headache.   alendronate  70 MG  tablet Commonly known as: FOSAMAX  Take 1 tablet (70 mg total) by mouth every 7 (seven) days. Take with a full glass of water on an empty stomach.   aspirin  EC 81 MG tablet Take 81 mg by mouth daily.   atorvastatin  40 MG tablet Commonly known as: LIPITOR Take 40 mg by mouth daily.   furosemide  20 MG tablet Commonly known as: LASIX  Take 1 tablet (20 mg total) by mouth as directed. alternating (40 mg) every other day. What changed: when to take this Changed by: Iden Stripling   INSULIN  ASPART IJ Inject as per sliding scale: if 0 - 201 = 0; 202+ = 7, subcutaneously before meals   metoprolol  succinate 100 MG 24 hr tablet Commonly known as: TOPROL -XL Take 100 mg by mouth 2 (two) times daily.   olmesartan  40 MG tablet Commonly known as: BENICAR  Take 40 mg by mouth daily.   oxyCODONE  5 MG immediate release tablet Commonly known as: Oxy IR/ROXICODONE  Take 1 tablet (5 mg total) by mouth every 4 (four) hours as needed for moderate pain (pain score 4-6) (pain score 4-6).   polyethylene glycol 17 g packet Commonly known as: MIRALAX  / GLYCOLAX  Take 17 g by mouth daily as needed for mild constipation.   potassium chloride  10 MEQ tablet Commonly known as: KLOR-CON  M Take 10 mEq by mouth 2 (two) times daily.   Toujeo  Max SoloStar 300 UNIT/ML Solostar Pen Generic drug: insulin  glargine (2 Unit Dial ) Inject 30 Units into the skin daily. What changed: how much to take   Voltaren  Arthritis Pain 1 % Gel Generic drug: diclofenac  Sodium Apply to R ankle topically four times a day for Reddened areas APPLY AWAY FROM OPENED AREAS        1. Type 2 diabetes mellitus with stage 3 chronic kidney disease, with long-term current use of insulin , unspecified whether stage 3a or 3b CKD (HCC) (Primary) 2. SYSTOLIC HEART FAILURE, CHRONIC 3. Type III open fracture of right ankle, sequela   Other orders - furosemide  (LASIX ) 20 MG tablet; Take 1 tablet (20 mg total) by mouth as directed.  alternating (40 mg) every other day.   Patient is being discharged to assisted living     Care plan discussed with the nursing staff  35 min Total time spent for obtaining history,  performing a medically appropriate examination and evaluation, reviewing the tests, documenting clinical information in the electronic or other health record, independently interpreting  results ,care coordination (not separately reported)

## 2023-11-08 DIAGNOSIS — R2689 Other abnormalities of gait and mobility: Secondary | ICD-10-CM | POA: Diagnosis not present

## 2023-11-08 DIAGNOSIS — R296 Repeated falls: Secondary | ICD-10-CM | POA: Diagnosis not present

## 2023-11-08 DIAGNOSIS — R29898 Other symptoms and signs involving the musculoskeletal system: Secondary | ICD-10-CM | POA: Diagnosis not present

## 2023-11-08 DIAGNOSIS — R41841 Cognitive communication deficit: Secondary | ICD-10-CM | POA: Diagnosis not present

## 2023-11-08 DIAGNOSIS — M6281 Muscle weakness (generalized): Secondary | ICD-10-CM | POA: Diagnosis not present

## 2023-11-08 DIAGNOSIS — S82851D Displaced trimalleolar fracture of right lower leg, subsequent encounter for closed fracture with routine healing: Secondary | ICD-10-CM | POA: Diagnosis not present

## 2023-11-08 DIAGNOSIS — R2681 Unsteadiness on feet: Secondary | ICD-10-CM | POA: Diagnosis not present

## 2023-11-09 DIAGNOSIS — R2689 Other abnormalities of gait and mobility: Secondary | ICD-10-CM | POA: Diagnosis not present

## 2023-11-09 DIAGNOSIS — R29898 Other symptoms and signs involving the musculoskeletal system: Secondary | ICD-10-CM | POA: Diagnosis not present

## 2023-11-09 DIAGNOSIS — R296 Repeated falls: Secondary | ICD-10-CM | POA: Diagnosis not present

## 2023-11-09 DIAGNOSIS — S82851D Displaced trimalleolar fracture of right lower leg, subsequent encounter for closed fracture with routine healing: Secondary | ICD-10-CM | POA: Diagnosis not present

## 2023-11-09 DIAGNOSIS — M6281 Muscle weakness (generalized): Secondary | ICD-10-CM | POA: Diagnosis not present

## 2023-11-09 DIAGNOSIS — R2681 Unsteadiness on feet: Secondary | ICD-10-CM | POA: Diagnosis not present

## 2023-11-09 DIAGNOSIS — R41841 Cognitive communication deficit: Secondary | ICD-10-CM | POA: Diagnosis not present

## 2023-11-11 DIAGNOSIS — R41841 Cognitive communication deficit: Secondary | ICD-10-CM | POA: Diagnosis not present

## 2023-11-11 DIAGNOSIS — R2689 Other abnormalities of gait and mobility: Secondary | ICD-10-CM | POA: Diagnosis not present

## 2023-11-11 DIAGNOSIS — S82851D Displaced trimalleolar fracture of right lower leg, subsequent encounter for closed fracture with routine healing: Secondary | ICD-10-CM | POA: Diagnosis not present

## 2023-11-11 DIAGNOSIS — R29898 Other symptoms and signs involving the musculoskeletal system: Secondary | ICD-10-CM | POA: Diagnosis not present

## 2023-11-11 DIAGNOSIS — R296 Repeated falls: Secondary | ICD-10-CM | POA: Diagnosis not present

## 2023-11-11 DIAGNOSIS — R2681 Unsteadiness on feet: Secondary | ICD-10-CM | POA: Diagnosis not present

## 2023-11-11 DIAGNOSIS — M6281 Muscle weakness (generalized): Secondary | ICD-10-CM | POA: Diagnosis not present

## 2023-11-14 ENCOUNTER — Ambulatory Visit: Payer: Medicare Other | Attending: Internal Medicine

## 2023-11-14 DIAGNOSIS — Z95810 Presence of automatic (implantable) cardiac defibrillator: Secondary | ICD-10-CM

## 2023-11-14 DIAGNOSIS — I5022 Chronic systolic (congestive) heart failure: Secondary | ICD-10-CM

## 2023-11-15 DIAGNOSIS — R296 Repeated falls: Secondary | ICD-10-CM | POA: Diagnosis not present

## 2023-11-15 DIAGNOSIS — S82851D Displaced trimalleolar fracture of right lower leg, subsequent encounter for closed fracture with routine healing: Secondary | ICD-10-CM | POA: Diagnosis not present

## 2023-11-15 DIAGNOSIS — M6281 Muscle weakness (generalized): Secondary | ICD-10-CM | POA: Diagnosis not present

## 2023-11-15 DIAGNOSIS — R2681 Unsteadiness on feet: Secondary | ICD-10-CM | POA: Diagnosis not present

## 2023-11-15 DIAGNOSIS — R41841 Cognitive communication deficit: Secondary | ICD-10-CM | POA: Diagnosis not present

## 2023-11-15 DIAGNOSIS — R29898 Other symptoms and signs involving the musculoskeletal system: Secondary | ICD-10-CM | POA: Diagnosis not present

## 2023-11-15 DIAGNOSIS — R2689 Other abnormalities of gait and mobility: Secondary | ICD-10-CM | POA: Diagnosis not present

## 2023-11-16 ENCOUNTER — Telehealth: Payer: Self-pay

## 2023-11-16 DIAGNOSIS — R41841 Cognitive communication deficit: Secondary | ICD-10-CM | POA: Diagnosis not present

## 2023-11-16 DIAGNOSIS — M6281 Muscle weakness (generalized): Secondary | ICD-10-CM | POA: Diagnosis not present

## 2023-11-16 DIAGNOSIS — R296 Repeated falls: Secondary | ICD-10-CM | POA: Diagnosis not present

## 2023-11-16 DIAGNOSIS — S82851D Displaced trimalleolar fracture of right lower leg, subsequent encounter for closed fracture with routine healing: Secondary | ICD-10-CM | POA: Diagnosis not present

## 2023-11-16 DIAGNOSIS — R2689 Other abnormalities of gait and mobility: Secondary | ICD-10-CM | POA: Diagnosis not present

## 2023-11-16 DIAGNOSIS — R29898 Other symptoms and signs involving the musculoskeletal system: Secondary | ICD-10-CM | POA: Diagnosis not present

## 2023-11-16 DIAGNOSIS — R2681 Unsteadiness on feet: Secondary | ICD-10-CM | POA: Diagnosis not present

## 2023-11-16 NOTE — Telephone Encounter (Signed)
 Remote ICM transmission received.  Attempted call to patient regarding ICM remote transmission and no answer or voice mail option.

## 2023-11-16 NOTE — Progress Notes (Signed)
 EPIC Encounter for ICM Monitoring  Patient Name: Grace Robinson is a 89 y.o. female Date: 11/16/2023 Primary Care Physican: Mast, Man X, NP Primary Cardiologist: Fernande Electrophysiologist: Fernande Pore Pacing:  98.1%     12/24/2022 Weight:  130 lbs   Clinical Status  Since 09-Oct-2023 Time in AT/AF  <0.1 hr/day (<0.1%)            Attempted call to patient and unable to reach.    Transmission results reviewed.    Optivol Thoracic impedance suggesting intermittent days with possible fluid accumulation within the last month.   Prescribed:  Furosemide  20 mg take 1 tablet (20 mg total) by mouth daily alternating (40 mg) every other day.   Potassium 10 mEq 1 tablet twice a day.   Labs: 10/07/2023 Creatinine 0.9, BUN 17, Potassium 4.2, Sodium 142  09/27/2023 Creatinine 1.0, BUN 21, Potassium 4.1, Sodium 137  09/10/2023 Creatinine 0.92, BUN 22, Potassium 3.8, Sodium 138  A complete set of results can be found in Results Review.   Recommendations:  Unable to reach.     Follow-up plan: ICM clinic phone appointment on 12/19/2023.   91 day device clinic remote transmission 11/30/2023.      EP/Cardiology Office Visits:  12/05/2023 with [name], NP.       Copy of ICM check sent to Dr. Fernande.    3 month ICM trend: 11/13/2023.    12-14 Month ICM trend:     Grace GORMAN Garner, RN 11/16/2023 8:29 AM

## 2023-11-17 DIAGNOSIS — R296 Repeated falls: Secondary | ICD-10-CM | POA: Diagnosis not present

## 2023-11-17 DIAGNOSIS — M6281 Muscle weakness (generalized): Secondary | ICD-10-CM | POA: Diagnosis not present

## 2023-11-17 DIAGNOSIS — R2689 Other abnormalities of gait and mobility: Secondary | ICD-10-CM | POA: Diagnosis not present

## 2023-11-17 DIAGNOSIS — R41841 Cognitive communication deficit: Secondary | ICD-10-CM | POA: Diagnosis not present

## 2023-11-17 DIAGNOSIS — S82851D Displaced trimalleolar fracture of right lower leg, subsequent encounter for closed fracture with routine healing: Secondary | ICD-10-CM | POA: Diagnosis not present

## 2023-11-17 DIAGNOSIS — R29898 Other symptoms and signs involving the musculoskeletal system: Secondary | ICD-10-CM | POA: Diagnosis not present

## 2023-11-17 DIAGNOSIS — R2681 Unsteadiness on feet: Secondary | ICD-10-CM | POA: Diagnosis not present

## 2023-11-18 DIAGNOSIS — S82851D Displaced trimalleolar fracture of right lower leg, subsequent encounter for closed fracture with routine healing: Secondary | ICD-10-CM | POA: Diagnosis not present

## 2023-11-18 DIAGNOSIS — R41841 Cognitive communication deficit: Secondary | ICD-10-CM | POA: Diagnosis not present

## 2023-11-18 DIAGNOSIS — R29898 Other symptoms and signs involving the musculoskeletal system: Secondary | ICD-10-CM | POA: Diagnosis not present

## 2023-11-18 DIAGNOSIS — R2689 Other abnormalities of gait and mobility: Secondary | ICD-10-CM | POA: Diagnosis not present

## 2023-11-18 DIAGNOSIS — R296 Repeated falls: Secondary | ICD-10-CM | POA: Diagnosis not present

## 2023-11-18 DIAGNOSIS — R2681 Unsteadiness on feet: Secondary | ICD-10-CM | POA: Diagnosis not present

## 2023-11-18 DIAGNOSIS — M6281 Muscle weakness (generalized): Secondary | ICD-10-CM | POA: Diagnosis not present

## 2023-11-21 ENCOUNTER — Non-Acute Institutional Stay: Payer: Self-pay | Admitting: Nurse Practitioner

## 2023-11-21 ENCOUNTER — Encounter: Payer: Self-pay | Admitting: Nurse Practitioner

## 2023-11-21 DIAGNOSIS — E538 Deficiency of other specified B group vitamins: Secondary | ICD-10-CM

## 2023-11-21 DIAGNOSIS — I5022 Chronic systolic (congestive) heart failure: Secondary | ICD-10-CM | POA: Diagnosis not present

## 2023-11-21 DIAGNOSIS — K5732 Diverticulitis of large intestine without perforation or abscess without bleeding: Secondary | ICD-10-CM | POA: Diagnosis not present

## 2023-11-21 DIAGNOSIS — Z780 Asymptomatic menopausal state: Secondary | ICD-10-CM

## 2023-11-21 DIAGNOSIS — R296 Repeated falls: Secondary | ICD-10-CM | POA: Diagnosis not present

## 2023-11-21 DIAGNOSIS — M8580 Other specified disorders of bone density and structure, unspecified site: Secondary | ICD-10-CM

## 2023-11-21 DIAGNOSIS — I10 Essential (primary) hypertension: Secondary | ICD-10-CM

## 2023-11-21 DIAGNOSIS — R41841 Cognitive communication deficit: Secondary | ICD-10-CM | POA: Diagnosis not present

## 2023-11-21 DIAGNOSIS — R29898 Other symptoms and signs involving the musculoskeletal system: Secondary | ICD-10-CM | POA: Diagnosis not present

## 2023-11-21 DIAGNOSIS — M6281 Muscle weakness (generalized): Secondary | ICD-10-CM | POA: Diagnosis not present

## 2023-11-21 DIAGNOSIS — N183 Chronic kidney disease, stage 3 unspecified: Secondary | ICD-10-CM

## 2023-11-21 DIAGNOSIS — E1122 Type 2 diabetes mellitus with diabetic chronic kidney disease: Secondary | ICD-10-CM | POA: Diagnosis not present

## 2023-11-21 DIAGNOSIS — R2689 Other abnormalities of gait and mobility: Secondary | ICD-10-CM | POA: Diagnosis not present

## 2023-11-21 DIAGNOSIS — R2681 Unsteadiness on feet: Secondary | ICD-10-CM | POA: Diagnosis not present

## 2023-11-21 DIAGNOSIS — S82851D Displaced trimalleolar fracture of right lower leg, subsequent encounter for closed fracture with routine healing: Secondary | ICD-10-CM | POA: Diagnosis not present

## 2023-11-21 DIAGNOSIS — Z794 Long term (current) use of insulin: Secondary | ICD-10-CM

## 2023-11-21 NOTE — Assessment & Plan Note (Addendum)
Low CBG in am, 77, 97, 98, decrease insulin Toujeo to 30u every day,  continue insulin Aspart Hgb A1c 7.5 02/25/23<<9.3 10/06/23 taking Atorvastatin, LDL 58 02/24/23

## 2023-11-21 NOTE — Assessment & Plan Note (Signed)
Sigmoid diverticulitis/IBS taking Bentyl, Imodium

## 2023-11-21 NOTE — Progress Notes (Signed)
Location:   [facility] Room Number: 823 Place of Service:  ALF (13) Provider: Arna Snipe Akeila Lana NP  Lynlee Stratton X, NP  Patient Care Team: Treonna Klee X, NP as PCP - General (Internal Medicine) Abigail Miyamoto, MD as Consulting Physician (General Surgery) Rachel Moulds, MD as Consulting Physician (Hematology and Oncology) Dorothy Puffer, MD as Consulting Physician (Radiation Oncology)  Extended Emergency Contact Information Primary Emergency Contact: [name] Address: [address]          [place], [place] [address] [place] Home Phone: [phone] Mobile Phone: [phone] Relation: Son  Code Status:  DNR Goals of care: Advanced Directive information    11/03/2023   10:59 AM  Advanced Directives  Does Patient Have a Medical Advance Directive? Yes  Type of Advance Directive Out of facility DNR (pink MOST or yellow form)  Does patient want to make changes to medical advance directive? No - Patient declined  Pre-existing out of facility DNR order (yellow form or pink MOST form) Yellow form placed in chart (order not valid for inpatient use)     Chief Complaint  Patient presents with   Medical Management of Chronic Issues    HPI:  Pt is a 89 y.o. female seen today for medical management of chronic diseases.      R ankle fx, healed, last f/u Ortho 11/01/23 WBAT, mild swelling RLE, slow healing surgical incision lateral R ankle, no s/s of infection.               Healed multiple fractures sustained from fall 02/18/23: pelvic fractures: nondisplaced fxs of R superior and inferior pubic rami, minimally displaced fx of the inferior R sacral ala extended to the R SIJ. Mild compression fx of superior endplates of T12, L2. Prn Tylenol  Fall, mechanical T2DM/renal complication, 11/21/23 Low CBG in am, 77, 97, 98, decrease insulin Toujeo to 30u every day,  continue insulin Aspart Hgb A1c 7.5 02/25/23<<9.3 10/06/23 Mild bilateral hydronephrosis, no  stones Sigmoid diverticulitis/IBS taking Bentyl, Imodium Aortic atherosclerosis, taking ASA, Atorvastatin HLD, taking Atorvastatin, LDL 58 02/24/23 Vit B12 deficiency, takes Vit B12, Hgb 11.6 10/06/23 Vit D deficiency, on Vit D, Vit D 41 02/24/23 CHF, taking Furosemide, Bun/creat 17/0.85 10/06/23 HTN, taking Metoprolol, Olmesartan OP 03/03/23 DEXA t score -1.236, on Ca, Vit D, on Fosamax 70mg  qwk              S/p R breat cancer lumpectomy, f/u surgeon, 03/15/23 cancer center radiation tx             Radiation dermatitis right upper chest/breast, healed.       Past Medical History:  Diagnosis Date   Biventricular ICD (implantable cardiac defibrillator) in place    downgraded from CRT-D to CRT-P 08/30/22   Breast cancer (HCC)    Cellulitis    Change in bowel habits    CHF (congestive heart failure) (HCC)    Colon polyps    Diabetes mellitus    Type II   Diarrhea    Dyslipidemia    HOH (hard of hearing)    left   HTN (hypertension)    LBBB (left bundle branch block)    chronic   Meniere's disease    Nonischemic cardiomyopathy (HCC) 2006   Presence of permanent cardiac pacemaker    T wave oversensing 01/17/2014   Ulcerative colitis    Varicose veins    4 lazer  treatment each leg   Past Surgical History:  Procedure Laterality Date  ABDOMINAL HYSTERECTOMY     BIV PACEMAKER GENERATOR CHANGEOUT N/A 08/30/2022   Procedure: BIV PACEMAKER GENERATOR CHANGEOUT;  Surgeon: Duke Salvia, MD;  Location: [facility] INVASIVE CV LAB;  Service: Cardiovascular;  Laterality: N/A;   BREAST LUMPECTOMY WITH RADIOACTIVE SEED LOCALIZATION Right 01/05/2023   Procedure: RIGHT BREAST BRACKETED LUMPECTOMY WITH RADIOACTIVE SEED LOCALIZATION;  Surgeon: Abigail Miyamoto, MD;  Location: [facility] OR;  Service: General;  Laterality: Right;   CARDIAC CATHETERIZATION  08/2011   CAST APPLICATION Right 09/06/2023   Procedure: CAST APPLICATION;  Surgeon: Tarry Kos, MD;  Location: [facility] OR;  Service: Orthopedics;  Laterality:  Right;   CHOLECYSTECTOMY     COLONOSCOPY     EP Study  08/22/08   I & D EXTREMITY Right 09/06/2023   Procedure: IRRIGATION AND DEBRIDEMENT RIGHT ANKLE;  Surgeon: Tarry Kos, MD;  Location: [facility] OR;  Service: Orthopedics;  Laterality: Right;   IMPLANTABLE CARDIOVERTER DEFIBRILLATOR GENERATOR CHANGE N/A 08/22/2013   Procedure: IMPLANTABLE CARDIOVERTER DEFIBRILLATOR GENERATOR CHANGE;  Surgeon: Duke Salvia, MD;  Location: [facility] CATH LAB;  Service: Cardiovascular;  Laterality: N/A;   KNEE ARTHROSCOPY Left 04/14/2016   Procedure: LEFT KNEE ARTHROSCOPY, PARTIAL MEDIAL AND PARTIAL LATERAL MENISCECTOMY, MEDIAL PLICA;  Surgeon: Jodi Geralds, MD;  Location: [facility] OR;  Service: Orthopedics;  Laterality: Left;   mastoid sugery     in the setting of Meniere's disease   ORIF ANKLE FRACTURE Right 09/09/2023   Procedure: OPEN REDUCTION INTERNAL FIXATION (ORIF) ANKLE FRACTURE;  Surgeon: [name], MD;  Location: [facility] OR;  Service: Orthopedics;  Laterality: Right;   TONSILLECTOMY AND ADENOIDECTOMY      Allergies  Allergen Reactions   Coreg [Carvedilol] Other (See Comments)    Unknown reaction   Firvanq [Vancomycin] Itching and Other (See Comments)    Red Leyland Kenna Syndrome   Lactose Intolerance (Gi)    Lanoxin [Digoxin] Other (See Comments)    DIG toxicity   Sudafed [Pseudoephedrine] Other (See Comments)    Makes skin feel like its crawling     Allergies as of 11/21/2023       Reactions   Coreg [carvedilol] Other (See Comments)   Unknown reaction   Firvanq [vancomycin] Itching, Other (See Comments)   Red Jamala Kohen Syndrome   Lactose Intolerance (gi)    Lanoxin [digoxin] Other (See Comments)   DIG toxicity   Sudafed [pseudoephedrine] Other (See Comments)   Makes skin feel like its crawling        Medication List        Accurate as of November 21, 2023  1:08 PM. If you have any questions, ask your nurse or doctor.          acetaminophen 500 MG tablet Commonly known as: TYLENOL Take 500 mg by  mouth every 8 (eight) hours as needed for headache.   alendronate 70 MG tablet Commonly known as: FOSAMAX Take 1 tablet (70 mg total) by mouth every 7 (seven) days. Take with a full glass of water on an empty stomach.   aspirin EC 81 MG tablet Take 81 mg by mouth daily.   atorvastatin 40 MG tablet Commonly known as: LIPITOR Take 40 mg by mouth daily.   furosemide 20 MG tablet Commonly known as: LASIX Take 1 tablet (20 mg total) by mouth as directed. alternating (40 mg) every other day.   INSULIN ASPART IJ Inject as per sliding scale: if 0 - 201 = 0; 202+ = 7, subcutaneously before meals   metoprolol succinate 100 MG 24  hr tablet Commonly known as: TOPROL-XL Take 100 mg by mouth 2 (two) times daily.   olmesartan 40 MG tablet Commonly known as: BENICAR Take 40 mg by mouth daily.   oxyCODONE 5 MG immediate release tablet Commonly known as: Oxy IR/ROXICODONE Take 1 tablet (5 mg total) by mouth every 4 (four) hours as needed for moderate pain (pain score 4-6) (pain score 4-6).   polyethylene glycol 17 g packet Commonly known as: MIRALAX / GLYCOLAX Take 17 g by mouth daily as needed for mild constipation.   potassium chloride 10 MEQ tablet Commonly known as: KLOR-CON M Take 10 mEq by mouth 2 (two) times daily.   Toujeo Max SoloStar 300 UNIT/ML Solostar Pen Generic drug: insulin glargine (2 Unit Dial) Inject 30 Units into the skin daily. What changed: how much to take   Voltaren Arthritis Pain 1 % Gel Generic drug: diclofenac Sodium Apply to R ankle topically four times a day for Reddened areas APPLY AWAY FROM OPENED AREAS        Review of Systems  Constitutional:  Negative for appetite change, fatigue and fever.  HENT:  Positive for hearing loss. Negative for congestion and trouble swallowing.   Eyes:  Negative for visual disturbance.  Respiratory:  Negative for cough, chest tightness and wheezing.        Residual hacking cough   Cardiovascular:  Positive for leg  swelling.  Gastrointestinal:  Negative for abdominal pain, constipation and diarrhea.  Genitourinary:  Negative for dysuria and urgency.  Musculoskeletal:  Positive for arthralgias, back pain and gait problem.       Right lower leg pain in the calf  Skin:  Positive for wound. Negative for color change.  Neurological:  Negative for tremors and headaches.  Psychiatric/Behavioral:  Negative for behavioral problems and sleep disturbance. The patient is not nervous/anxious.     Immunization History  Administered Date(s) Administered   Influenza, High Dose Seasonal PF 10/06/2016, 10/05/2017, 09/14/2018, 08/17/2022, 08/31/2023   Influenza,inj,Quad PF,6+ Mos 10/02/2014   Moderna Covid-19 Vaccine Bivalent Booster 70yrs & up 08/17/2023   PNEUMOCOCCAL CONJUGATE-20 03/16/2023   PPD Test 02/22/2023   Pneumococcal-Unspecified 03/29/2022   Tdap 09/29/2022, 09/06/2023   Unspecified SARS-COV-2 Vaccination 11/05/2019, 12/03/2019, 03/31/2021, 07/21/2021, 03/19/2022   Zoster Recombinant(Shingrix) 03/07/2023   Pertinent  Health Maintenance Due  Topic Date Due   FOOT EXAM  Never done   OPHTHALMOLOGY EXAM  Never done   HEMOGLOBIN A1C  08/27/2023   INFLUENZA VACCINE  Completed   DEXA SCAN  Completed      05/19/2023    2:37 PM 06/02/2023    2:49 PM 06/09/2023    8:48 AM 07/08/2023   10:35 AM 07/29/2023    9:55 AM  Fall Risk  Falls in the past year? 1 1 1 1  0  Was there an injury with Fall? 1 1 1 1  0  Fall Risk Category Calculator 3 3 3 3  0  Patient at Risk for Falls Due to  Impaired balance/gait;History of fall(s) History of fall(s);Impaired balance/gait History of fall(s);Impaired balance/gait;Impaired mobility History of fall(s);Impaired balance/gait;Impaired mobility  Fall risk Follow up Falls evaluation completed Falls evaluation completed Falls evaluation completed Falls evaluation completed Falls evaluation completed   Functional Status Survey:    Vitals:   11/21/23 1158  BP: (!) 140/70   Pulse: 68  Resp: 18  Temp: (!) 97.5 F (36.4 C)  SpO2: 96%  Weight: 132 lb 9.6 oz (60.1 kg)   Body mass index is 21.4 kg/m. Physical Exam  Vitals and nursing note reviewed.  Constitutional:      Appearance: Normal appearance.  HENT:     Head: Normocephalic and atraumatic.     Nose: Nose normal.     Mouth/Throat:     Mouth: Mucous membranes are moist.  Eyes:     Extraocular Movements: Extraocular movements intact.     Conjunctiva/sclera: Conjunctivae normal.     Pupils: Pupils are equal, round, and reactive to light.  Cardiovascular:     Rate and Rhythm: Normal rate and regular rhythm.     Heart sounds: No murmur heard.    Comments: Pacemaker left upper chest(defibrillator was removed per patient's request in the past) No dorsalis pedis pulses or posterior tibial pulses felt.  Pulmonary:     Effort: Pulmonary effort is normal.     Breath sounds: No rales.  Abdominal:     General: Bowel sounds are normal.     Palpations: Abdomen is soft.     Tenderness: There is no abdominal tenderness.  Musculoskeletal:     Cervical back: Normal range of motion and neck supple.     Right lower leg: Edema present.     Left lower leg: No edema.     Comments: Lower back, pelvic/thigh pain with movement/weight bearing.  Mild edema RLE  Skin:    General: Skin is warm and dry.     Findings: Erythema present.     Comments: S/p R breat cancer lumpectomy, no apparent edema R arm.   Radiation dermatitis right upper chest/breast, healed.  R lower leg mild erythema, warmth, better than prior, slow healing surgical wound w/o s/s of infection.    Neurological:     General: No focal deficit present.     Mental Status: She is alert and oriented to person, place, and time. Mental status is at baseline.     Gait: Gait abnormal.     Comments: Ambulates with walker.   Psychiatric:        Mood and Affect: Mood normal.        Behavior: Behavior normal.        Thought Content: Thought content  normal.     Labs reviewed: Recent Labs    09/08/23 0615 09/09/23 0559 09/10/23 0530 09/27/23 0000 10/07/23 0000  NA 140 141 138 137 142  K 3.3* 4.3 3.8 4.1 4.2  CL 106 108 102 99 105  CO2 24 25 25  28* 28*  GLUCOSE 199* 143* 181*  --   --   BUN 25* 23 22 21 17   CREATININE 0.93 0.94 0.92 1.0 0.9  CALCIUM 8.5* 8.7* 8.3* 8.6* 8.5*  MG 2.0 2.1 2.2  --   --    Recent Labs    12/08/22 0801 02/25/23 0000 09/27/23 0000 09/28/23 0000 10/07/23 0000  AST 19 16  --  15 20  ALT 13 10  --  16 16  ALKPHOS 67 62  --  147* 103  BILITOT 0.8  --   --   --   --   PROT 6.6  --   --   --   --   ALBUMIN 4.0 3.8 3.3*  --  3.3*   Recent Labs    02/25/23 0000 09/06/23 2148 09/08/23 0615 09/09/23 0559 09/10/23 0530 09/28/23 0000 10/07/23 0000  WBC 5.3   < > 12.5* 11.9* 8.1 4.9 4.6  NEUTROABS 3,546.00  --   --   --   --  3,244.00 2,838.00  HGB 12.9   < >  11.3* 11.6* 10.5* 11.0* 11.6*  HCT 38   < > 33.4* 34.9* 31.9* 34* 35*  MCV  --    < > 95.4 97.5 95.8  --   --   PLT 179   < > 137* 136* 119* 138* 145*   < > = values in this interval not displayed.   Lab Results  Component Value Date   TSH 1.93 02/25/2023   Lab Results  Component Value Date   HGBA1C 7.5 02/25/2023   Lab Results  Component Value Date   CHOL 116 02/25/2023   HDL 35 02/25/2023   LDLCALC 58 02/25/2023   TRIG 144 02/25/2023   CHOLHDL 2.7 11/10/2020    Significant Diagnostic Results in last 30 days:  XR Ankle Complete Right Result Date: 11/07/2023 Three-view radiographs of the right ankle shows stable internal fixation of the right ankle with congruent mortise.  Patient has calcified arteries out to the midfoot.   Assessment/Plan  Type 2 diabetes mellitus with renal complication (HCC) Low CBG in am, 77, 97, 98, decrease insulin Toujeo to 30u every day,  continue insulin Aspart Hgb A1c 7.5 02/25/23<<9.3 10/06/23 taking Atorvastatin, LDL 58 02/24/23  Sigmoid diverticulitis Sigmoid diverticulitis/IBS taking  Bentyl, Imodium  Vitamin B12 deficiency takes Vit B12, Hgb 11.6 10/06/23  SYSTOLIC HEART FAILURE, CHRONIC taking Furosemide, Bun/creat 17/0.85 10/06/23, RLE edema s/p R ankle fx. LLE no swelling.   HTN (hypertension) Blood pressure is controlled, taking Metoprolol, Olmesartan  Osteopenia after menopause OP 03/03/23 DEXA t score -1.236, on Ca, Vit D, on Fosamax 70mg  qwk    Family/ staff Communication: plan of care reviewed with the patient and charge nurse.   Labs/tests ordered:  none  Time spend 40 minutes.

## 2023-11-21 NOTE — Assessment & Plan Note (Signed)
takes Vit B12, Hgb 11.6 10/06/23

## 2023-11-21 NOTE — Assessment & Plan Note (Signed)
Blood pressure is controlled,  taking Metoprolol, Olmesartan 

## 2023-11-21 NOTE — Assessment & Plan Note (Signed)
 OP 03/03/23 DEXA t score -1.236, on Ca, Vit D, on Fosamax 70mg  qwk

## 2023-11-21 NOTE — Assessment & Plan Note (Signed)
taking Furosemide, Bun/creat 17/0.85 10/06/23, RLE edema s/p R ankle fx. LLE no swelling.

## 2023-11-22 DIAGNOSIS — R296 Repeated falls: Secondary | ICD-10-CM | POA: Diagnosis not present

## 2023-11-22 DIAGNOSIS — R2681 Unsteadiness on feet: Secondary | ICD-10-CM | POA: Diagnosis not present

## 2023-11-22 DIAGNOSIS — R2689 Other abnormalities of gait and mobility: Secondary | ICD-10-CM | POA: Diagnosis not present

## 2023-11-22 DIAGNOSIS — R41841 Cognitive communication deficit: Secondary | ICD-10-CM | POA: Diagnosis not present

## 2023-11-22 DIAGNOSIS — M6281 Muscle weakness (generalized): Secondary | ICD-10-CM | POA: Diagnosis not present

## 2023-11-22 DIAGNOSIS — S82851D Displaced trimalleolar fracture of right lower leg, subsequent encounter for closed fracture with routine healing: Secondary | ICD-10-CM | POA: Diagnosis not present

## 2023-11-22 DIAGNOSIS — R29898 Other symptoms and signs involving the musculoskeletal system: Secondary | ICD-10-CM | POA: Diagnosis not present

## 2023-11-23 DIAGNOSIS — R29898 Other symptoms and signs involving the musculoskeletal system: Secondary | ICD-10-CM | POA: Diagnosis not present

## 2023-11-23 DIAGNOSIS — M6281 Muscle weakness (generalized): Secondary | ICD-10-CM | POA: Diagnosis not present

## 2023-11-23 DIAGNOSIS — S82851D Displaced trimalleolar fracture of right lower leg, subsequent encounter for closed fracture with routine healing: Secondary | ICD-10-CM | POA: Diagnosis not present

## 2023-11-23 DIAGNOSIS — R296 Repeated falls: Secondary | ICD-10-CM | POA: Diagnosis not present

## 2023-11-23 DIAGNOSIS — R2681 Unsteadiness on feet: Secondary | ICD-10-CM | POA: Diagnosis not present

## 2023-11-23 DIAGNOSIS — R2689 Other abnormalities of gait and mobility: Secondary | ICD-10-CM | POA: Diagnosis not present

## 2023-11-23 DIAGNOSIS — R41841 Cognitive communication deficit: Secondary | ICD-10-CM | POA: Diagnosis not present

## 2023-11-25 DIAGNOSIS — R2681 Unsteadiness on feet: Secondary | ICD-10-CM | POA: Diagnosis not present

## 2023-11-25 DIAGNOSIS — R29898 Other symptoms and signs involving the musculoskeletal system: Secondary | ICD-10-CM | POA: Diagnosis not present

## 2023-11-25 DIAGNOSIS — R296 Repeated falls: Secondary | ICD-10-CM | POA: Diagnosis not present

## 2023-11-25 DIAGNOSIS — R2689 Other abnormalities of gait and mobility: Secondary | ICD-10-CM | POA: Diagnosis not present

## 2023-11-25 DIAGNOSIS — R41841 Cognitive communication deficit: Secondary | ICD-10-CM | POA: Diagnosis not present

## 2023-11-25 DIAGNOSIS — M6281 Muscle weakness (generalized): Secondary | ICD-10-CM | POA: Diagnosis not present

## 2023-11-25 DIAGNOSIS — S82851D Displaced trimalleolar fracture of right lower leg, subsequent encounter for closed fracture with routine healing: Secondary | ICD-10-CM | POA: Diagnosis not present

## 2023-11-30 ENCOUNTER — Ambulatory Visit (INDEPENDENT_AMBULATORY_CARE_PROVIDER_SITE_OTHER): Payer: Medicare Other

## 2023-11-30 DIAGNOSIS — I428 Other cardiomyopathies: Secondary | ICD-10-CM

## 2023-11-30 DIAGNOSIS — M6281 Muscle weakness (generalized): Secondary | ICD-10-CM | POA: Diagnosis not present

## 2023-11-30 DIAGNOSIS — R2689 Other abnormalities of gait and mobility: Secondary | ICD-10-CM | POA: Diagnosis not present

## 2023-11-30 DIAGNOSIS — R2681 Unsteadiness on feet: Secondary | ICD-10-CM | POA: Diagnosis not present

## 2023-11-30 DIAGNOSIS — R29898 Other symptoms and signs involving the musculoskeletal system: Secondary | ICD-10-CM | POA: Diagnosis not present

## 2023-11-30 DIAGNOSIS — R296 Repeated falls: Secondary | ICD-10-CM | POA: Diagnosis not present

## 2023-11-30 DIAGNOSIS — S82851D Displaced trimalleolar fracture of right lower leg, subsequent encounter for closed fracture with routine healing: Secondary | ICD-10-CM | POA: Diagnosis not present

## 2023-11-30 DIAGNOSIS — R41841 Cognitive communication deficit: Secondary | ICD-10-CM | POA: Diagnosis not present

## 2023-11-30 LAB — CUP PACEART REMOTE DEVICE CHECK
Battery Remaining Longevity: 129 mo
Battery Voltage: 3.03 V
Brady Statistic AP VP Percent: 46.81 %
Brady Statistic AP VS Percent: 0.73 %
Brady Statistic AS VP Percent: 51.17 %
Brady Statistic AS VS Percent: 1.29 %
Brady Statistic RA Percent Paced: 47.69 %
Brady Statistic RV Percent Paced: 0.65 %
Date Time Interrogation Session: 20250128211847
Implantable Lead Connection Status: 753985
Implantable Lead Connection Status: 753985
Implantable Lead Connection Status: 753985
Implantable Lead Connection Status: 753985
Implantable Lead Implant Date: 20031007
Implantable Lead Implant Date: 20031007
Implantable Lead Implant Date: 20031007
Implantable Lead Implant Date: 20060227
Implantable Lead Location: 753858
Implantable Lead Location: 753859
Implantable Lead Location: 753860
Implantable Lead Location: 753860
Implantable Lead Model: 4193
Implantable Lead Model: 4469
Implantable Lead Model: 4470
Implantable Lead Model: 6949
Implantable Lead Serial Number: 337988
Implantable Lead Serial Number: 361236
Implantable Pulse Generator Implant Date: 20231030
Lead Channel Impedance Value: 3363 Ohm
Lead Channel Impedance Value: 3363 Ohm
Lead Channel Impedance Value: 3363 Ohm
Lead Channel Impedance Value: 342 Ohm
Lead Channel Impedance Value: 342 Ohm
Lead Channel Impedance Value: 380 Ohm
Lead Channel Impedance Value: 513 Ohm
Lead Channel Impedance Value: 532 Ohm
Lead Channel Impedance Value: 589 Ohm
Lead Channel Pacing Threshold Amplitude: 0.625 V
Lead Channel Pacing Threshold Amplitude: 1.5 V
Lead Channel Pacing Threshold Pulse Width: 0.4 ms
Lead Channel Pacing Threshold Pulse Width: 0.4 ms
Lead Channel Sensing Intrinsic Amplitude: 1.25 mV
Lead Channel Sensing Intrinsic Amplitude: 1.25 mV
Lead Channel Sensing Intrinsic Amplitude: 31.625 mV
Lead Channel Sensing Intrinsic Amplitude: 31.625 mV
Lead Channel Setting Pacing Amplitude: 1.5 V
Lead Channel Setting Pacing Amplitude: 1.5 V
Lead Channel Setting Pacing Amplitude: 3.25 V
Lead Channel Setting Pacing Pulse Width: 0.4 ms
Lead Channel Setting Pacing Pulse Width: 0.8 ms
Lead Channel Setting Sensing Sensitivity: 1.2 mV
Zone Setting Status: 755011

## 2023-12-02 DIAGNOSIS — M6281 Muscle weakness (generalized): Secondary | ICD-10-CM | POA: Diagnosis not present

## 2023-12-02 DIAGNOSIS — R296 Repeated falls: Secondary | ICD-10-CM | POA: Diagnosis not present

## 2023-12-02 DIAGNOSIS — S82851D Displaced trimalleolar fracture of right lower leg, subsequent encounter for closed fracture with routine healing: Secondary | ICD-10-CM | POA: Diagnosis not present

## 2023-12-02 DIAGNOSIS — R2681 Unsteadiness on feet: Secondary | ICD-10-CM | POA: Diagnosis not present

## 2023-12-02 DIAGNOSIS — R29898 Other symptoms and signs involving the musculoskeletal system: Secondary | ICD-10-CM | POA: Diagnosis not present

## 2023-12-02 DIAGNOSIS — R41841 Cognitive communication deficit: Secondary | ICD-10-CM | POA: Diagnosis not present

## 2023-12-02 DIAGNOSIS — R2689 Other abnormalities of gait and mobility: Secondary | ICD-10-CM | POA: Diagnosis not present

## 2023-12-05 ENCOUNTER — Encounter: Payer: Self-pay | Admitting: Pulmonary Disease

## 2023-12-05 ENCOUNTER — Ambulatory Visit: Payer: Medicare Other | Attending: Pulmonary Disease | Admitting: Pulmonary Disease

## 2023-12-05 VITALS — BP 142/78 | HR 68 | Ht 66.0 in | Wt 133.5 lb

## 2023-12-05 DIAGNOSIS — I5022 Chronic systolic (congestive) heart failure: Secondary | ICD-10-CM | POA: Diagnosis not present

## 2023-12-05 DIAGNOSIS — I428 Other cardiomyopathies: Secondary | ICD-10-CM

## 2023-12-05 DIAGNOSIS — Z950 Presence of cardiac pacemaker: Secondary | ICD-10-CM | POA: Diagnosis not present

## 2023-12-05 DIAGNOSIS — I472 Ventricular tachycardia, unspecified: Secondary | ICD-10-CM | POA: Diagnosis not present

## 2023-12-05 LAB — CUP PACEART INCLINIC DEVICE CHECK
Battery Remaining Longevity: 130 mo
Battery Voltage: 3.03 V
Brady Statistic AP VP Percent: 35.41 %
Brady Statistic AP VS Percent: 0.55 %
Brady Statistic AS VP Percent: 62.54 %
Brady Statistic AS VS Percent: 1.5 %
Brady Statistic RA Percent Paced: 36.07 %
Brady Statistic RV Percent Paced: 0.68 %
Date Time Interrogation Session: 20250203190542
Implantable Lead Connection Status: 753985
Implantable Lead Connection Status: 753985
Implantable Lead Connection Status: 753985
Implantable Lead Connection Status: 753985
Implantable Lead Implant Date: 20031007
Implantable Lead Implant Date: 20031007
Implantable Lead Implant Date: 20031007
Implantable Lead Implant Date: 20060227
Implantable Lead Location: 753858
Implantable Lead Location: 753859
Implantable Lead Location: 753860
Implantable Lead Location: 753860
Implantable Lead Model: 4193
Implantable Lead Model: 4469
Implantable Lead Model: 4470
Implantable Lead Model: 6949
Implantable Lead Serial Number: 337988
Implantable Lead Serial Number: 361236
Implantable Pulse Generator Implant Date: 20231030
Lead Channel Impedance Value: 3363 Ohm
Lead Channel Impedance Value: 3363 Ohm
Lead Channel Impedance Value: 3363 Ohm
Lead Channel Impedance Value: 361 Ohm
Lead Channel Impedance Value: 380 Ohm
Lead Channel Impedance Value: 399 Ohm
Lead Channel Impedance Value: 532 Ohm
Lead Channel Impedance Value: 589 Ohm
Lead Channel Impedance Value: 646 Ohm
Lead Channel Pacing Threshold Amplitude: 0.625 V
Lead Channel Pacing Threshold Amplitude: 1.625 V
Lead Channel Pacing Threshold Pulse Width: 0.4 ms
Lead Channel Pacing Threshold Pulse Width: 0.4 ms
Lead Channel Sensing Intrinsic Amplitude: 1.25 mV
Lead Channel Sensing Intrinsic Amplitude: 1.875 mV
Lead Channel Sensing Intrinsic Amplitude: 31.625 mV
Lead Channel Sensing Intrinsic Amplitude: 31.625 mV
Lead Channel Setting Pacing Amplitude: 1.5 V
Lead Channel Setting Pacing Amplitude: 1.5 V
Lead Channel Setting Pacing Amplitude: 3.25 V
Lead Channel Setting Pacing Pulse Width: 0.4 ms
Lead Channel Setting Pacing Pulse Width: 0.8 ms
Lead Channel Setting Sensing Sensitivity: 1.2 mV
Zone Setting Status: 755011

## 2023-12-05 NOTE — Patient Instructions (Signed)
 Medication Instructions:  Your physician recommends that you continue on your current medications as directed. Please refer to the Current Medication list given to you today.  *If you need a refill on your cardiac medications before your next appointment, please call your pharmacy*  Lab Work: None ordered If you have labs (blood work) drawn today and your tests are completely normal, you will receive your results only by: MyChart Message (if you have MyChart) OR A paper copy in the mail If you have any lab test that is abnormal or we need to change your treatment, we will call you to review the results.  Follow-Up: At [facility], you and your health needs are our priority.  As part of our continuing mission to provide you with exceptional heart care, we have created designated Provider Care Teams.  These Care Teams include your primary Cardiologist (physician) and Advanced Practice Providers (APPs -  Physician Assistants and Nurse Practitioners) who all work together to provide you with the care you need, when you need it.  Your next appointment:   6 month(s)  Provider:   Canary Brim, NP

## 2023-12-05 NOTE — Progress Notes (Signed)
  Electrophysiology Office Note:   Date:  12/05/2023  ID:  Grace Robinson, DOB 26-Jun-1926, MRN [id number]  Primary Cardiologist: None Primary Heart Failure: None Electrophysiologist: Sherryl Manges, MD      History of Present Illness:   Grace Robinson is a 89 y.o. female with h/o NICM, VT, chronic CHF (systolic), DM, HTN, HLD, LBBB, CRT-D, ulcerative colitis, Meniere's disease, suspect TIA  seen today for routine electrophysiology followup.   Since last being seen in our clinic the patient reports doing very well. She has moved back to the assisted living side at [facility] and is hopeful to go back to the independent living as before she broke her leg.  No device related concerns.  Feels well > notes LE swelling but is improved overall.   She denies chest pain, palpitations, dyspnea, PND, orthopnea, nausea, vomiting, dizziness, syncope, edema, weight gain, or early satiety.   Review of systems complete and found to be negative unless listed in HPI.   EP Information / Studies Reviewed:    EKG is ordered today. Personal review as below.  EKG Interpretation Date/Time:  Monday December 05 2023 14:25:54 EST Ventricular Rate:  68 PR Interval:  136 QRS Duration:  134 QT Interval:  466 QTC Calculation: 495 R Axis:   154  Text Interpretation: AV dual-paced rhythm Confirmed by Canary Brim ([id number]) on 12/05/2023 7:08:28 PM   ICD Interrogation-  reviewed in detail today,  See PACEART report.  Device History: MDT CRT-D implanted 2003, RV ICD lead implanted 2006, last gen change 2014 Has a 6949 lead She has an abandoned RV pacing lead, though Dr. Graciela Husbands discusses failed [id number] lead prompting need to "pirate"  her pacing lead in 2007   + appropriate tx for VT w/ATP  2019 2020 2021 BiV CRT-D implanted 08/07/2002, RV ICD lead implanted 2/272006 for NICM. Generator change, CRT-D downgraded to a CRT-P 08/30/22.  History of appropriate therapy: Yes History of AAD therapy: No   Studies:  ECHO 2015  > LVEF 50-55%, G1DD    Arrhythmia / AAD SCAF  Physical Exam:   VS:  BP (!) 142/78   Pulse 68   Ht 5\' 6"  (1.676 m)   Wt 133 lb 8 oz (60.6 kg)   SpO2 99%   BMI 21.55 kg/m    Wt Readings from Last 3 Encounters:  12/05/23 133 lb 8 oz (60.6 kg)  11/21/23 132 lb 9.6 oz (60.1 kg)  11/07/23 131 lb 8 oz (59.6 kg)     GEN: Well nourished, well developed in no acute distress NECK: No JVD; No carotid bruits CARDIAC: Regular rate and rhythm, no murmurs, rubs, gallops RESPIRATORY:  Clear to auscultation without rales, wheezing or rhonchi  ABDOMEN: Soft, non-tender, non-distended EXTREMITIES:  No edema; No deformity   ASSESSMENT AND PLAN:    Chronic Systolic Dysfunction due to NICM s/p [employer]tronic  CRT-P   HFpEF VT  Recovered LVEF  -euvolemic today -Stable on an appropriate medical regimen -continue Toprol 100mg  BID, olmesartan, lasix -See Pace Art report -No changes today -97.9% BiV pacing  -few seconds long episodes of NSVT  Hypertension  -mildly elevated in clinic, monitor for now   SCAF <0.1 % burden on device  -not on anticoagulation given low burden    Disposition:   Follow up with Dr. Graciela Husbands in 6 months   Signed, Canary Brim, NP-C, AGACNP-BC [facility] - Electrophysiology  12/05/2023, 7:12 PM

## 2023-12-06 DIAGNOSIS — R296 Repeated falls: Secondary | ICD-10-CM | POA: Diagnosis not present

## 2023-12-06 DIAGNOSIS — D044 Carcinoma in situ of skin of scalp and neck: Secondary | ICD-10-CM | POA: Diagnosis not present

## 2023-12-06 DIAGNOSIS — R2689 Other abnormalities of gait and mobility: Secondary | ICD-10-CM | POA: Diagnosis not present

## 2023-12-06 DIAGNOSIS — R41841 Cognitive communication deficit: Secondary | ICD-10-CM | POA: Diagnosis not present

## 2023-12-06 DIAGNOSIS — R29898 Other symptoms and signs involving the musculoskeletal system: Secondary | ICD-10-CM | POA: Diagnosis not present

## 2023-12-07 DIAGNOSIS — R296 Repeated falls: Secondary | ICD-10-CM | POA: Diagnosis not present

## 2023-12-07 DIAGNOSIS — R41841 Cognitive communication deficit: Secondary | ICD-10-CM | POA: Diagnosis not present

## 2023-12-07 DIAGNOSIS — R29898 Other symptoms and signs involving the musculoskeletal system: Secondary | ICD-10-CM | POA: Diagnosis not present

## 2023-12-07 DIAGNOSIS — R2689 Other abnormalities of gait and mobility: Secondary | ICD-10-CM | POA: Diagnosis not present

## 2023-12-09 DIAGNOSIS — R41841 Cognitive communication deficit: Secondary | ICD-10-CM | POA: Diagnosis not present

## 2023-12-09 DIAGNOSIS — R29898 Other symptoms and signs involving the musculoskeletal system: Secondary | ICD-10-CM | POA: Diagnosis not present

## 2023-12-09 DIAGNOSIS — R2689 Other abnormalities of gait and mobility: Secondary | ICD-10-CM | POA: Diagnosis not present

## 2023-12-09 DIAGNOSIS — R296 Repeated falls: Secondary | ICD-10-CM | POA: Diagnosis not present

## 2023-12-12 DIAGNOSIS — R296 Repeated falls: Secondary | ICD-10-CM | POA: Diagnosis not present

## 2023-12-12 DIAGNOSIS — R29898 Other symptoms and signs involving the musculoskeletal system: Secondary | ICD-10-CM | POA: Diagnosis not present

## 2023-12-12 DIAGNOSIS — R2689 Other abnormalities of gait and mobility: Secondary | ICD-10-CM | POA: Diagnosis not present

## 2023-12-12 DIAGNOSIS — R41841 Cognitive communication deficit: Secondary | ICD-10-CM | POA: Diagnosis not present

## 2023-12-14 DIAGNOSIS — R296 Repeated falls: Secondary | ICD-10-CM | POA: Diagnosis not present

## 2023-12-14 DIAGNOSIS — R41841 Cognitive communication deficit: Secondary | ICD-10-CM | POA: Diagnosis not present

## 2023-12-14 DIAGNOSIS — R29898 Other symptoms and signs involving the musculoskeletal system: Secondary | ICD-10-CM | POA: Diagnosis not present

## 2023-12-14 DIAGNOSIS — R2689 Other abnormalities of gait and mobility: Secondary | ICD-10-CM | POA: Diagnosis not present

## 2023-12-15 DIAGNOSIS — R296 Repeated falls: Secondary | ICD-10-CM | POA: Diagnosis not present

## 2023-12-15 DIAGNOSIS — R2689 Other abnormalities of gait and mobility: Secondary | ICD-10-CM | POA: Diagnosis not present

## 2023-12-15 DIAGNOSIS — R41841 Cognitive communication deficit: Secondary | ICD-10-CM | POA: Diagnosis not present

## 2023-12-15 DIAGNOSIS — R29898 Other symptoms and signs involving the musculoskeletal system: Secondary | ICD-10-CM | POA: Diagnosis not present

## 2023-12-16 DIAGNOSIS — R41841 Cognitive communication deficit: Secondary | ICD-10-CM | POA: Diagnosis not present

## 2023-12-16 DIAGNOSIS — R2689 Other abnormalities of gait and mobility: Secondary | ICD-10-CM | POA: Diagnosis not present

## 2023-12-16 DIAGNOSIS — R296 Repeated falls: Secondary | ICD-10-CM | POA: Diagnosis not present

## 2023-12-16 DIAGNOSIS — R29898 Other symptoms and signs involving the musculoskeletal system: Secondary | ICD-10-CM | POA: Diagnosis not present

## 2023-12-19 ENCOUNTER — Ambulatory Visit: Payer: Medicare Other | Attending: Internal Medicine

## 2023-12-19 DIAGNOSIS — I5022 Chronic systolic (congestive) heart failure: Secondary | ICD-10-CM

## 2023-12-19 DIAGNOSIS — Z950 Presence of cardiac pacemaker: Secondary | ICD-10-CM | POA: Diagnosis not present

## 2023-12-20 DIAGNOSIS — M79671 Pain in right foot: Secondary | ICD-10-CM | POA: Diagnosis not present

## 2023-12-20 DIAGNOSIS — B351 Tinea unguium: Secondary | ICD-10-CM | POA: Diagnosis not present

## 2023-12-20 DIAGNOSIS — M79672 Pain in left foot: Secondary | ICD-10-CM | POA: Diagnosis not present

## 2023-12-21 ENCOUNTER — Telehealth: Payer: Self-pay

## 2023-12-21 DIAGNOSIS — R41841 Cognitive communication deficit: Secondary | ICD-10-CM | POA: Diagnosis not present

## 2023-12-21 DIAGNOSIS — R29898 Other symptoms and signs involving the musculoskeletal system: Secondary | ICD-10-CM | POA: Diagnosis not present

## 2023-12-21 DIAGNOSIS — R296 Repeated falls: Secondary | ICD-10-CM | POA: Diagnosis not present

## 2023-12-21 DIAGNOSIS — R2689 Other abnormalities of gait and mobility: Secondary | ICD-10-CM | POA: Diagnosis not present

## 2023-12-21 NOTE — Telephone Encounter (Signed)
 Remote ICM transmission received.  Attempted mobile and home call to patient regarding ICM remote transmission and no answer.

## 2023-12-21 NOTE — Progress Notes (Signed)
 EPIC Encounter for ICM Monitoring  Patient Name: Grace Robinson is a 89 y.o. female Date: 12/21/2023 Primary Care Physican: Mast, Man X, NP Primary Cardiologist: Graciela Husbands Electrophysiologist: Joycelyn Schmid Pacing:  98.1%     12/24/2022 Weight:  130 lbs   Clinical Status  Since 05-Dec-2023 Time in AT/AF  <0.1 hr/day (<0.1%)            Attempted call to patient and unable to reach.   Transmission results reviewed.    Optivol Thoracic impedance suggesting intermittent days with possible fluid accumulation within the last month.   Prescribed:  Furosemide 20 mg take 1 tablet (20 mg total) by mouth daily alternating (40 mg) every other day.   Potassium 10 mEq 1 tablet twice a day.   Labs: 10/07/2023 Creatinine 0.9, BUN 17, Potassium 4.2, Sodium 142  09/27/2023 Creatinine 1.0, BUN 21, Potassium 4.1, Sodium 137  09/10/2023 Creatinine 0.92, BUN 22, Potassium 3.8, Sodium 138  A complete set of results can be found in Results Review.   Recommendations:  Unable to reach.     Follow-up plan: ICM clinic phone appointment on 01/23/2024.   91 day device clinic remote transmission 02/29/2024.      EP/Cardiology Office Visits:  Recall 06/02/2024 with Canary Brim, NP.       Copy of ICM check sent to Dr. Graciela Husbands.    3 month ICM trend: 12/19/2023.    12-14 Month ICM trend:     Karie Soda, RN 12/21/2023 8:35 AM

## 2023-12-23 ENCOUNTER — Non-Acute Institutional Stay (SKILLED_NURSING_FACILITY): Payer: Self-pay | Admitting: Sports Medicine

## 2023-12-23 DIAGNOSIS — E1169 Type 2 diabetes mellitus with other specified complication: Secondary | ICD-10-CM

## 2023-12-23 DIAGNOSIS — Z780 Asymptomatic menopausal state: Secondary | ICD-10-CM

## 2023-12-23 DIAGNOSIS — Z794 Long term (current) use of insulin: Secondary | ICD-10-CM

## 2023-12-23 DIAGNOSIS — S82831F Other fracture of upper and lower end of right fibula, subsequent encounter for open fracture type IIIA, IIIB, or IIIC with routine healing: Secondary | ICD-10-CM | POA: Diagnosis not present

## 2023-12-23 DIAGNOSIS — E1122 Type 2 diabetes mellitus with diabetic chronic kidney disease: Secondary | ICD-10-CM

## 2023-12-23 DIAGNOSIS — R29898 Other symptoms and signs involving the musculoskeletal system: Secondary | ICD-10-CM | POA: Diagnosis not present

## 2023-12-23 DIAGNOSIS — E785 Hyperlipidemia, unspecified: Secondary | ICD-10-CM

## 2023-12-23 DIAGNOSIS — I10 Essential (primary) hypertension: Secondary | ICD-10-CM

## 2023-12-23 DIAGNOSIS — M8580 Other specified disorders of bone density and structure, unspecified site: Secondary | ICD-10-CM

## 2023-12-23 DIAGNOSIS — R296 Repeated falls: Secondary | ICD-10-CM | POA: Diagnosis not present

## 2023-12-23 DIAGNOSIS — N183 Chronic kidney disease, stage 3 unspecified: Secondary | ICD-10-CM

## 2023-12-23 DIAGNOSIS — R2689 Other abnormalities of gait and mobility: Secondary | ICD-10-CM | POA: Diagnosis not present

## 2023-12-23 DIAGNOSIS — R41841 Cognitive communication deficit: Secondary | ICD-10-CM | POA: Diagnosis not present

## 2023-12-23 NOTE — Progress Notes (Signed)
 Provider:  Dr. Venita Sheffield Location:  [facility] Place of Service:   Assisted living   PCP: Mast, Man X, NP Patient Care Team: Mast, Man X, NP as PCP - General (Internal Medicine) Duke Salvia, MD as PCP - Electrophysiology (Cardiology) Abigail Miyamoto, MD as Consulting Physician (General Surgery) Rachel Moulds, MD as Consulting Physician (Hematology and Oncology) Dorothy Puffer, MD as Consulting Physician (Radiation Oncology)  Extended Emergency Contact Information Primary Emergency Contact: [name] Address: [address]          [place], [place] [address] [place] of [place] Home Phone: [phone] Mobile Phone: [phone] Relation: Son  Goals of care:  Advanced Directive information    11/03/2023   10:59 AM  Advanced Directives  Does Patient Have a Medical Advance Directive? Yes  Type of Advance Directive Out of facility DNR (pink MOST or yellow form)  Does patient want to make changes to medical advance directive? No - Patient declined  Pre-existing out of facility DNR order (yellow form or pink MOST form) Yellow form placed in chart (order not valid for inpatient use)     Allergies  Allergen Reactions   Coreg [Carvedilol] Other (See Comments)    Unknown reaction   Firvanq [Vancomycin] Itching and Other (See Comments)    Red Man Syndrome   Lactose Intolerance (Gi)    Lanoxin [Digoxin] Other (See Comments)    DIG toxicity   Sudafed [Pseudoephedrine] Other (See Comments)    Makes skin feel like its crawling       History of Present Illness          89 yr old F with h/o NICM, chronic systolic heart failure, DM, HTN, HLD  is seen today for discharge.  Pt seen and examined in her room.  Pt states she is doing fine.  Denies chest pain, sob, abdominal pain, nausea, vomiting, dysuria, hematuria, bloody or dark stools.  DM  Pt is currently on sliding scale insulin, Toujeo  Pt has difficulty managing sliding scale She  prefers to stay only on one insulin   Rt ankle fracture- pt denies pain  Followed with ortho      Past Medical History:  Diagnosis Date   Biventricular ICD (implantable cardiac defibrillator) in place    downgraded from CRT-D to CRT-P 08/30/22   Breast cancer (HCC)    Cellulitis    Change in bowel habits    CHF (congestive heart failure) (HCC)    Colon polyps    Diabetes mellitus    Type II   Diarrhea    Dyslipidemia    HOH (hard of hearing)    left   HTN (hypertension)    LBBB (left bundle branch block)    chronic   Meniere's disease    Nonischemic cardiomyopathy (HCC) 2006   Presence of permanent cardiac pacemaker    T wave oversensing 01/17/2014   Ulcerative colitis    Varicose veins    4 lazer  treatment each leg    Past Surgical History:  Procedure Laterality Date   ABDOMINAL HYSTERECTOMY     BIV PACEMAKER GENERATOR CHANGEOUT N/A 08/30/2022   Procedure: BIV PACEMAKER GENERATOR CHANGEOUT;  Surgeon: Duke Salvia, MD;  Location: [facility] INVASIVE CV LAB;  Service: Cardiovascular;  Laterality: N/A;   BREAST LUMPECTOMY WITH RADIOACTIVE SEED LOCALIZATION Right 01/05/2023   Procedure: RIGHT BREAST BRACKETED LUMPECTOMY WITH RADIOACTIVE SEED LOCALIZATION;  Surgeon: Abigail Miyamoto, MD;  Location: [facility] OR;  Service: General;  Laterality: Right;  CARDIAC CATHETERIZATION  08/2011   CAST APPLICATION Right 09/06/2023   Procedure: CAST APPLICATION;  Surgeon: Tarry Kos, MD;  Location: [facility] OR;  Service: Orthopedics;  Laterality: Right;   CHOLECYSTECTOMY     COLONOSCOPY     EP Study  08/22/08   I & D EXTREMITY Right 09/06/2023   Procedure: IRRIGATION AND DEBRIDEMENT RIGHT ANKLE;  Surgeon: Tarry Kos, MD;  Location: [facility] OR;  Service: Orthopedics;  Laterality: Right;   IMPLANTABLE CARDIOVERTER DEFIBRILLATOR GENERATOR CHANGE N/A 08/22/2013   Procedure: IMPLANTABLE CARDIOVERTER DEFIBRILLATOR GENERATOR CHANGE;  Surgeon: Duke Salvia, MD;  Location: [facility] CATH LAB;  Service:  Cardiovascular;  Laterality: N/A;   KNEE ARTHROSCOPY Left 04/14/2016   Procedure: LEFT KNEE ARTHROSCOPY, PARTIAL MEDIAL AND PARTIAL LATERAL MENISCECTOMY, MEDIAL PLICA;  Surgeon: Jodi Geralds, MD;  Location: [facility] OR;  Service: Orthopedics;  Laterality: Left;   mastoid sugery     in the setting of Meniere's disease   ORIF ANKLE FRACTURE Right 09/09/2023   Procedure: OPEN REDUCTION INTERNAL FIXATION (ORIF) ANKLE FRACTURE;  Surgeon: [name], MD;  Location: [facility] OR;  Service: Orthopedics;  Laterality: Right;   TONSILLECTOMY AND ADENOIDECTOMY        reports that she has quit smoking. Her smoking use included cigarettes. She has never been exposed to tobacco smoke. She has never used smokeless tobacco. She reports that she does not drink alcohol and does not use drugs. 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    Social Drivers of Corporate investment banker Strain: Low Risk  (12/08/2022)   Overall Financial Resource Strain (CARDIA)    Difficulty of Paying Living Expenses: Not hard at all  Food Insecurity: No Food Insecurity (09/13/2023)   Hunger Vital Sign    Worried About Running Out of Food in the Last Year: Never true    Ran Out of Food in the Last Year: Never true  Transportation Needs: No Transportation Needs  (09/13/2023)   PRAPARE - Administrator, Civil Service (Medical): No    Lack of Transportation (Non-Medical): No  Physical Activity: Not on file  Stress: Not on file  Social Connections: Not on file  Intimate Partner Violence: Not At Risk (09/13/2023)   Humiliation, Afraid, Rape, and Kick questionnaire    Fear of Current or Ex-Partner: No    Emotionally Abused: No    Physically Abused: No    Sexually Abused: No   Functional Status Survey:    Allergies  Allergen Reactions   Coreg [Carvedilol] Other (See Comments)    Unknown reaction   Firvanq [Vancomycin] Itching and Other (See Comments)    Red Man Syndrome   Lactose  Intolerance (Gi)    Lanoxin [Digoxin] Other (See Comments)    DIG toxicity   Sudafed [Pseudoephedrine] Other (See Comments)    Makes skin feel like its crawling     Pertinent  Health Maintenance Due  Topic Date Due   FOOT EXAM  Never done   OPHTHALMOLOGY EXAM  Never done   HEMOGLOBIN A1C  08/27/2023   INFLUENZA VACCINE  Completed   DEXA SCAN  Completed    Medications: Outpatient Encounter Medications as of 12/23/2023  Medication Sig   acetaminophen (TYLENOL) 500 MG tablet Take 500 mg by mouth every 8 (eight) hours as needed for headache.   alendronate (FOSAMAX) 70 MG tablet Take 1 tablet (70 mg total) by mouth every 7 (seven) days. Take with a full glass of water on an empty stomach.   aspirin EC 81 MG tablet Take 81 mg by mouth daily.   atorvastatin (LIPITOR) 40 MG tablet Take 40 mg by mouth daily.   diclofenac Sodium (VOLTAREN ARTHRITIS PAIN) 1 % GEL Apply to R ankle topically four times a day for Reddened areas APPLY AWAY FROM OPENED AREAS   furosemide (LASIX) 20 MG tablet Take 1 tablet (20 mg total) by mouth as directed. alternating (40 mg) every other day.   INSULIN ASPART IJ Inject as per sliding scale: if 0 - 201 = 0; 202+ = 7, subcutaneously before meals   insulin glargine, 2 Unit Dial, (TOUJEO MAX SOLOSTAR) 300 UNIT/ML Solostar Pen  Inject 30 Units into the skin daily. (Patient taking differently: Inject 35 Units into the skin daily.)   metoprolol succinate (TOPROL-XL) 100 MG 24 hr tablet Take 100 mg by mouth 2 (two) times daily.   olmesartan (BENICAR) 40 MG tablet Take 40 mg by mouth daily.   potassium chloride (K-DUR) 10 MEQ tablet Take 10 mEq by mouth 2 (two) times daily.   No facility-administered encounter medications on file as of 12/23/2023.    Review of Systems  Constitutional:  Negative for fever.  HENT:  Negative for sore throat.   Respiratory:  Negative for cough, shortness of breath and wheezing.   Cardiovascular:  Negative for chest pain, palpitations and leg swelling.  Gastrointestinal:  Negative for abdominal distention, abdominal pain, blood in stool, constipation, diarrhea, nausea and vomiting.  Genitourinary:  Negative for dysuria.  Neurological:  Negative for dizziness and numbness.    There were no vitals filed for this visit. There is no height or weight on file to calculate BMI. Physical Exam Constitutional:      Appearance: Normal appearance.  Cardiovascular:     Rate and Rhythm: Normal rate and regular rhythm.  Pulmonary:     Effort: Pulmonary effort is normal. No respiratory distress.     Breath sounds: Normal breath sounds. No wheezing.  Abdominal:     General: Bowel sounds are normal. There is no distension.     Tenderness: There is no abdominal tenderness. There is no guarding or rebound.     Comments:    Musculoskeletal:        General: Swelling present.     Comments: Mild lower extremity swelling in Rt leg   Neurological:     Mental Status: She is alert. Mental status is at baseline.     Labs reviewed: Basic Metabolic Panel: Recent Labs    09/08/23 0615 09/09/23 0559 09/10/23 0530 09/27/23 0000 10/07/23 0000  NA 140 141 138 137 142  K 3.3* 4.3 3.8 4.1 4.2  CL 106 108 102 99 105  CO2 24 25 25  28* 28*  GLUCOSE 199* 143* 181*  --   --   BUN 25* 23 22 21 17    CREATININE 0.93 0.94 0.92 1.0 0.9  CALCIUM 8.5* 8.7* 8.3* 8.6* 8.5*  MG 2.0 2.1 2.2  --   --    Liver Function Tests: Recent Labs    02/25/23 0000 09/27/23 0000 09/28/23 0000 10/07/23 0000  AST 16  --  15 20  ALT 10  --  16 16  ALKPHOS 62  --  147* 103  ALBUMIN 3.8 3.3*  --  3.3*   No results for input(s): "LIPASE", "AMYLASE" in the last 8760 hours. No results for input(s): "AMMONIA" in the last 8760 hours. CBC: Recent Labs    02/25/23 0000 09/06/23 2148 09/08/23 0615 09/09/23 0559 09/10/23 0530 09/28/23 0000 10/07/23 0000  WBC 5.3   < > 12.5* 11.9* 8.1 4.9 4.6  NEUTROABS 3,546.00  --   --   --   --  3,244.00 2,838.00  HGB 12.9   < > 11.3* 11.6* 10.5* 11.0* 11.6*  HCT 38   < > 33.4* 34.9* 31.9* 34* 35*  MCV  --    < > 95.4 97.5 95.8  --   --   PLT 179   < > 137* 136* 119* 138* 145*   < > = values in this interval not displayed.   Cardiac Enzymes: No results for input(s): "CKTOTAL", "CKMB", "CKMBINDEX", "TROPONINI" in the last 8760 hours. BNP: Invalid input(s): "POCBNP" CBG: Recent Labs    09/13/23 2048 09/14/23 0708 09/14/23 1110  GLUCAP 268* 162* 198*    Procedures and Imaging Studies During Stay: CUP PACEART INCLINIC DEVICE CHECK Result Date: 12/05/2023 Normal in-clinic CRT-P check. Thresholds, sensing, impedance  trend, and HF diagnostics are within normal limits or stable for patient over time. Brief, seconds long, episodes of NSVT, AT/AF > longest AT/AF of 2 minutes, <0.1% burden. Patient BiV pacing 97.9 % of the time. Estimated longevity 8yr, 10 mo . Pt enrolled in remote follow-up.  CUP PACEART REMOTE DEVICE CHECK Result Date: 11/30/2023 Scheduled remote reviewed. Normal device function.  There have been 5 NSVT since last scheduled transmission, 8-11 beats in duration, 1 >20 beats, HR's 160's HF diagnostics currently abnormal Next remote 91 days. LA, CVRS   Assessment and Plan      1. Type 2 diabetes mellitus with stage 3 chronic kidney disease,  with long-term current use of insulin, unspecified whether stage 3a or 3b CKD (HCC) (Primary) Will cont with toujeo 35 units Stopped slidings clae last week  2. Hyperlipidemia associated with type 2 diabetes mellitus (HCC) Cont with lipitor  3. Primary hypertension At goal  Cont with the same  4. Type III open fracture of distal end of right fibula with routine healing, unspecified fracture morphology, subsequent encounter Take tylenol prn for pain D/c oxycodone  5. Osteopenia after menopause Cont with fosamax  - alendronate (FOSAMAX) 70 MG tablet; Take 1 tablet (70 mg total) by mouth every 7 (seven) days. Take with a full glass of water on an empty stomach.  Dispense: 4 tablet; Refill: 0  Other orders - aspirin EC 81 MG tablet; Take 1 tablet (81 mg total) by mouth daily. Swallow whole.  Dispense: 30 tablet; Refill: 0 - atorvastatin (LIPITOR) 40 MG tablet; Take 1 tablet (40 mg total) by mouth daily.  Dispense: 30 tablet; Refill: 0 - furosemide (LASIX) 20 MG tablet; Take 1 tablet (20 mg total) by mouth as directed. alternating (40  mg) every other day.  Dispense: 30 tablet; Refill: 0 - insulin glargine, 2 Unit Dial, (TOUJEO MAX SOLOSTAR) 300 UNIT/ML Solostar Pen; Inject 35 Units into the skin daily.  Dispense: 9 mL; Refill: 0 - metoprolol succinate (TOPROL-XL) 100 MG 24 hr tablet; Take 1 tablet (100 mg total) by mouth 2 (two) times daily.  Dispense: 60 tablet; Refill: 0 - olmesartan (BENICAR) 40 MG tablet; Take 1 tablet (40 mg total) by mouth daily.  Dispense: 30 tablet; Refill: 0 - potassium chloride (KLOR-CON M) 10 MEQ tablet; Take 1 tablet (10 mEq total) by mouth 2 (two) times daily.  Dispense: 60 tablet; Refill: 0 - diclofenac Sodium (VOLTAREN ARTHRITIS PAIN) 1 % GEL; Apply 2 g topically 4 (four) times daily. Apply to R ankle topically four times a day for Reddened areas APPLY AWAY FROM OPENED AREAS  Dispense: 100 g; Refill: 2        Patient has been advised to f/u with their PCP  in 1-2 weeks to for a transitions of care visit.     Ademide Schaberg   35 min Total time spent for obtaining history,  performing a medically appropriate examination and evaluation, reviewing the tests,  documenting clinical information in the electronic or other health record,discharge planning ,care coordination (not separately reported)

## 2023-12-26 ENCOUNTER — Other Ambulatory Visit: Payer: Self-pay | Admitting: Sports Medicine

## 2023-12-26 ENCOUNTER — Encounter: Payer: Self-pay | Admitting: Sports Medicine

## 2023-12-26 DIAGNOSIS — R29898 Other symptoms and signs involving the musculoskeletal system: Secondary | ICD-10-CM | POA: Diagnosis not present

## 2023-12-26 DIAGNOSIS — M8580 Other specified disorders of bone density and structure, unspecified site: Secondary | ICD-10-CM

## 2023-12-26 DIAGNOSIS — R296 Repeated falls: Secondary | ICD-10-CM | POA: Diagnosis not present

## 2023-12-26 DIAGNOSIS — R41841 Cognitive communication deficit: Secondary | ICD-10-CM | POA: Diagnosis not present

## 2023-12-26 DIAGNOSIS — R2689 Other abnormalities of gait and mobility: Secondary | ICD-10-CM | POA: Diagnosis not present

## 2023-12-26 MED ORDER — ALENDRONATE SODIUM 70 MG PO TABS: 70.0000 mg | ORAL_TABLET | ORAL | 0 refills | Status: DC

## 2023-12-26 MED ORDER — FUROSEMIDE 20 MG PO TABS: 20.0000 mg | ORAL_TABLET | ORAL | 0 refills | Status: DC

## 2023-12-26 MED ORDER — ASPIRIN 81 MG PO TBEC: 81.0000 mg | DELAYED_RELEASE_TABLET | Freq: Every day | ORAL | 0 refills | Status: DC

## 2023-12-26 MED ORDER — ATORVASTATIN CALCIUM 40 MG PO TABS: 40.0000 mg | ORAL_TABLET | Freq: Every day | ORAL | 0 refills | Status: DC

## 2023-12-26 MED ORDER — TOUJEO MAX SOLOSTAR 300 UNIT/ML ~~LOC~~ SOPN: 35.0000 [IU] | PEN_INJECTOR | Freq: Every day | SUBCUTANEOUS | 0 refills | Status: DC

## 2023-12-26 MED ORDER — POTASSIUM CHLORIDE CRYS ER 10 MEQ PO TBCR: 10.0000 meq | EXTENDED_RELEASE_TABLET | Freq: Two times a day (BID) | ORAL | 0 refills | Status: DC

## 2023-12-26 MED ORDER — OLMESARTAN MEDOXOMIL 40 MG PO TABS: 40.0000 mg | ORAL_TABLET | Freq: Every day | ORAL | 0 refills | Status: DC

## 2023-12-26 MED ORDER — DICLOFENAC SODIUM 1 % EX GEL: 2.0000 g | Freq: Four times a day (QID) | CUTANEOUS | 2 refills | Status: DC

## 2023-12-26 MED ORDER — METOPROLOL SUCCINATE ER 100 MG PO TB24: 100.0000 mg | ORAL_TABLET | Freq: Two times a day (BID) | ORAL | 0 refills | Status: DC

## 2023-12-26 NOTE — Telephone Encounter (Signed)
 Assisted Living Resident. Refills are handled through the facility staff

## 2023-12-28 DIAGNOSIS — R2689 Other abnormalities of gait and mobility: Secondary | ICD-10-CM | POA: Diagnosis not present

## 2023-12-28 DIAGNOSIS — R41841 Cognitive communication deficit: Secondary | ICD-10-CM | POA: Diagnosis not present

## 2023-12-28 DIAGNOSIS — R296 Repeated falls: Secondary | ICD-10-CM | POA: Diagnosis not present

## 2023-12-28 DIAGNOSIS — R29898 Other symptoms and signs involving the musculoskeletal system: Secondary | ICD-10-CM | POA: Diagnosis not present

## 2023-12-30 DIAGNOSIS — R29898 Other symptoms and signs involving the musculoskeletal system: Secondary | ICD-10-CM | POA: Diagnosis not present

## 2023-12-30 DIAGNOSIS — R41841 Cognitive communication deficit: Secondary | ICD-10-CM | POA: Diagnosis not present

## 2023-12-30 DIAGNOSIS — R296 Repeated falls: Secondary | ICD-10-CM | POA: Diagnosis not present

## 2023-12-30 DIAGNOSIS — R2689 Other abnormalities of gait and mobility: Secondary | ICD-10-CM | POA: Diagnosis not present

## 2024-01-02 ENCOUNTER — Encounter: Payer: Self-pay | Admitting: Internal Medicine

## 2024-01-02 DIAGNOSIS — R2689 Other abnormalities of gait and mobility: Secondary | ICD-10-CM | POA: Diagnosis not present

## 2024-01-02 DIAGNOSIS — R29898 Other symptoms and signs involving the musculoskeletal system: Secondary | ICD-10-CM | POA: Diagnosis not present

## 2024-01-02 DIAGNOSIS — R296 Repeated falls: Secondary | ICD-10-CM | POA: Diagnosis not present

## 2024-01-04 DIAGNOSIS — R296 Repeated falls: Secondary | ICD-10-CM | POA: Diagnosis not present

## 2024-01-04 DIAGNOSIS — R29898 Other symptoms and signs involving the musculoskeletal system: Secondary | ICD-10-CM | POA: Diagnosis not present

## 2024-01-04 DIAGNOSIS — R2689 Other abnormalities of gait and mobility: Secondary | ICD-10-CM | POA: Diagnosis not present

## 2024-01-09 NOTE — Progress Notes (Signed)
 Remote pacemaker transmission.

## 2024-01-16 ENCOUNTER — Telehealth: Payer: Self-pay | Admitting: Internal Medicine

## 2024-01-16 DIAGNOSIS — M1711 Unilateral primary osteoarthritis, right knee: Secondary | ICD-10-CM | POA: Diagnosis not present

## 2024-01-16 DIAGNOSIS — M1712 Unilateral primary osteoarthritis, left knee: Secondary | ICD-10-CM | POA: Diagnosis not present

## 2024-01-16 DIAGNOSIS — M170 Bilateral primary osteoarthritis of knee: Secondary | ICD-10-CM | POA: Diagnosis not present

## 2024-01-16 NOTE — Telephone Encounter (Signed)
 Patient came to AL nurse and Told her that her CBG is 511 She got Cortisone shot today.from Ortho I told them to give her 8 units of Novolog  She already took Toujeo in the morning. She will continue to monitor her CBGS and Follow with [facility]

## 2024-01-20 ENCOUNTER — Emergency Department (HOSPITAL_COMMUNITY)
Admission: EM | Admit: 2024-01-20 | Discharge: 2024-01-20 | Disposition: A | Discharge status: Home / Self Care | Attending: Emergency Medicine | Admitting: Emergency Medicine

## 2024-01-20 ENCOUNTER — Telehealth: Payer: Self-pay | Admitting: Student

## 2024-01-20 ENCOUNTER — Emergency Department (HOSPITAL_COMMUNITY)

## 2024-01-20 DIAGNOSIS — Z87891 Personal history of nicotine dependence: Secondary | ICD-10-CM | POA: Diagnosis not present

## 2024-01-20 DIAGNOSIS — I6782 Cerebral ischemia: Secondary | ICD-10-CM | POA: Diagnosis not present

## 2024-01-20 DIAGNOSIS — I110 Hypertensive heart disease with heart failure: Secondary | ICD-10-CM | POA: Diagnosis not present

## 2024-01-20 DIAGNOSIS — Z794 Long term (current) use of insulin: Secondary | ICD-10-CM | POA: Insufficient documentation

## 2024-01-20 DIAGNOSIS — E1129 Type 2 diabetes mellitus with other diabetic kidney complication: Secondary | ICD-10-CM | POA: Diagnosis not present

## 2024-01-20 DIAGNOSIS — I10 Essential (primary) hypertension: Secondary | ICD-10-CM

## 2024-01-20 DIAGNOSIS — R739 Hyperglycemia, unspecified: Secondary | ICD-10-CM | POA: Diagnosis not present

## 2024-01-20 DIAGNOSIS — Z853 Personal history of malignant neoplasm of breast: Secondary | ICD-10-CM | POA: Insufficient documentation

## 2024-01-20 DIAGNOSIS — Z8616 Personal history of COVID-19: Secondary | ICD-10-CM | POA: Diagnosis not present

## 2024-01-20 DIAGNOSIS — Z95810 Presence of automatic (implantable) cardiac defibrillator: Secondary | ICD-10-CM | POA: Diagnosis not present

## 2024-01-20 DIAGNOSIS — R42 Dizziness and giddiness: Secondary | ICD-10-CM | POA: Diagnosis not present

## 2024-01-20 DIAGNOSIS — I5022 Chronic systolic (congestive) heart failure: Secondary | ICD-10-CM | POA: Insufficient documentation

## 2024-01-20 DIAGNOSIS — Z79899 Other long term (current) drug therapy: Secondary | ICD-10-CM | POA: Insufficient documentation

## 2024-01-20 DIAGNOSIS — Z7982 Long term (current) use of aspirin: Secondary | ICD-10-CM | POA: Insufficient documentation

## 2024-01-20 DIAGNOSIS — R519 Headache, unspecified: Secondary | ICD-10-CM | POA: Diagnosis not present

## 2024-01-20 DIAGNOSIS — R079 Chest pain, unspecified: Secondary | ICD-10-CM | POA: Diagnosis not present

## 2024-01-20 LAB — CBC WITH DIFFERENTIAL/PLATELET
Abs Immature Granulocytes: 0.02 10*3/uL (ref 0.00–0.07)
Basophils Absolute: 0 10*3/uL (ref 0.0–0.1)
Basophils Relative: 0 %
Eosinophils Absolute: 0 10*3/uL (ref 0.0–0.5)
Eosinophils Relative: 0 %
HCT: 43.7 % (ref 36.0–46.0)
Hemoglobin: 14 g/dL (ref 12.0–15.0)
Immature Granulocytes: 0 %
Lymphocytes Relative: 9 %
Lymphs Abs: 0.6 10*3/uL — ABNORMAL LOW (ref 0.7–4.0)
MCH: 31.3 pg (ref 26.0–34.0)
MCHC: 32 g/dL (ref 30.0–36.0)
MCV: 97.5 fL (ref 80.0–100.0)
Monocytes Absolute: 0.6 10*3/uL (ref 0.1–1.0)
Monocytes Relative: 8 %
Neutro Abs: 5.6 10*3/uL (ref 1.7–7.7)
Neutrophils Relative %: 83 %
Platelets: 157 10*3/uL (ref 150–400)
RBC: 4.48 MIL/uL (ref 3.87–5.11)
RDW: 14.6 % (ref 11.5–15.5)
WBC: 6.8 10*3/uL (ref 4.0–10.5)
nRBC: 0 % (ref 0.0–0.2)

## 2024-01-20 LAB — BASIC METABOLIC PANEL
Anion gap: 12 (ref 5–15)
BUN: 22 mg/dL (ref 8–23)
CO2: 26 mmol/L (ref 22–32)
Calcium: 9 mg/dL (ref 8.9–10.3)
Chloride: 99 mmol/L (ref 98–111)
Creatinine, Ser: 0.85 mg/dL (ref 0.44–1.00)
GFR, Estimated: >60 mL/min (ref >60)
Glucose, Bld: 283 mg/dL — ABNORMAL HIGH (ref 70–99)
Potassium: 3.7 mmol/L (ref 3.5–5.1)
Sodium: 137 mmol/L (ref 135–145)

## 2024-01-20 LAB — CBG MONITORING, ED: Glucose-Capillary: 342 mg/dL — ABNORMAL HIGH (ref 70–99)

## 2024-01-20 MED ORDER — SODIUM CHLORIDE 0.9 % IV BOLUS
1000.0000 mL | Freq: Once | INTRAVENOUS | Status: AC
Start: 2024-01-20 — End: 2024-01-20
  Administered 2024-01-20: 1000 mL via INTRAVENOUS

## 2024-01-20 MED ORDER — HYDRALAZINE HCL 10 MG PO TABS: 10.0000 mg | ORAL_TABLET | Freq: Once | ORAL | Status: DC

## 2024-01-20 NOTE — Telephone Encounter (Signed)
 Patient with glucose >500. On steroids with numerous glucose elevated. SSI (novolog) ordered for once daily, will have it ordered for [facility] give multiple glucose checks per day. 10 units given.   F/u next week given uncontrolled glucose and ED visit for HTN.

## 2024-01-20 NOTE — Discharge Instructions (Signed)
 We evaluated you for your high blood pressure.  Your testing was reassuring including your laboratory testing and CT scan.  We did not see any dangerous findings.  Please talk to your primary care doctor about adjusting your blood pressure medication.  Please return if you develop any new or worsening symptoms such as lightheadedness or dizziness, severe headaches, chest pain, shortness of breath, or any other new symptoms.

## 2024-01-20 NOTE — ED Provider Notes (Signed)
 [place] EMERGENCY DEPARTMENT AT [facility] Provider Note  CSN: [id number] Arrival date & time: 01/20/24 1003  Chief Complaint(s) Hypertension  HPI Grace Robinson is a 89 y.o. female history of nonischemic cardiomyopathy, pacemaker, hypertension, hyperlipidemia, diabetes presenting to the emergency department with high blood pressure.  Patient lives at assisted living, had blood pressure checked this morning and was 220 so sent to the ER.  Patient reports that overall she feels normal.  She had a slight headache this morning which is resolved.  Otherwise denies chest pain, shortness of breath, abdominal pain, numbness or tingling, weakness, vision changes, speech changes, trouble swallowing, or any other new symptoms.  She notes that she had corticosteroid injections to both knees recently, had high blood sugar subsequently in the 600s which has improved but remains somewhat elevated.  Does not have any symptoms of dry mouth, polyuria.   Past Medical History Past Medical History:  Diagnosis Date   Biventricular ICD (implantable cardiac defibrillator) in place    downgraded from CRT-D to CRT-P 08/30/22   Breast cancer (HCC)    Cellulitis    Change in bowel habits    CHF (congestive heart failure) (HCC)    Colon polyps    Diabetes mellitus    Type II   Diarrhea    Dyslipidemia    HOH (hard of hearing)    left   HTN (hypertension)    LBBB (left bundle branch block)    chronic   Meniere's disease    Nonischemic cardiomyopathy (HCC) 2006   Presence of permanent cardiac pacemaker    T wave oversensing 01/17/2014   Ulcerative colitis    Varicose veins    4 lazer  treatment each leg   Patient Active Problem List   Diagnosis Date Noted   Numbness of right hand 11/03/2023   Right calf pain 10/10/2023   Cellulitis 09/26/2023   IBS (irritable bowel syndrome) 09/10/2023   Open trimalleolar fracture of ankle, right, type III, initial encounter 09/09/2023   Hypokalemia  09/08/2023   Dislocation of ankle, right, open, initial encounter 09/07/2023   Open fracture of right distal fibula 09/06/2023   COVID-19 virus infection 06/09/2023   Gait abnormality 05/19/2023   Osteoarthritis 05/03/2023   Radiation dermatitis 05/03/2023   Osteopenia after menopause 03/04/2023   Closed pelvic fracture (HCC) 02/23/2023   Lower back pain 02/23/2023   Hydronephrosis 02/23/2023   Sigmoid diverticulitis 02/23/2023   Aortic atherosclerosis (HCC) 02/23/2023   Hyperlipidemia associated with type 2 diabetes mellitus (HCC) 02/23/2023   Vitamin B12 deficiency 02/23/2023   Vitamin D deficiency 02/23/2023   Type 2 diabetes mellitus with renal complication (HCC) 02/23/2023   S/P lumpectomy, right breast 01/05/2023   Ductal carcinoma in situ (DCIS) of right breast 12/06/2022   NICM (nonischemic cardiomyopathy) (HCC) 07/30/2021   Degenerative tear of posterior horn of medial meniscus of left knee 04/14/2016   Tear of lateral meniscus of left knee 04/14/2016   Synovial plica of left knee 04/14/2016   Mechanical complication of implantable cardioverter-defibrillator (ICD) 01/17/2014   Fall 07/10/2013   Paroxysmal ventricular tachycardia (HCC) 12/13/2012   Biventricular implantable cardioverter-defibrillator -[employer] 12/08/2011   Atrial tachycardia (HCC) 12/08/2011   HTN (hypertension) 05/19/2011   CARDIOMYOPATHY, PRIMARY, DILATED 12/01/2010   SYSTOLIC HEART FAILURE, CHRONIC 12/01/2010   Home Medication(s) Prior to Admission medications   Medication Sig Start Date End Date Taking? Authorizing Provider  acetaminophen (TYLENOL) 500 MG tablet Take 500 mg by mouth every 8 (eight) hours as needed  for headache.    [provider]  alendronate (FOSAMAX) 70 MG tablet TAKE 1 TABLET(70 MG) BY MOUTH EVERY 7 DAYS WITH A FULL GLASS OF WATER AND ON AN EMPTY STOMACH 12/26/23   Mast, Man X, NP  aspirin EC 81 MG tablet Take 81 mg by mouth daily.    [provider]  aspirin EC  81 MG tablet Take 1 tablet (81 mg total) by mouth daily. Swallow whole. 12/26/23   Venita Sheffield, MD  atorvastatin (LIPITOR) 40 MG tablet TAKE 1 TABLET(40 MG) BY MOUTH DAILY 12/26/23   Mast, Man X, NP  diclofenac Sodium (VOLTAREN ARTHRITIS PAIN) 1 % GEL Apply 2 g topically 4 (four) times daily. Apply to R ankle topically four times a day for Reddened areas APPLY AWAY FROM OPENED AREAS 12/26/23   Venita Sheffield, MD  furosemide (LASIX) 20 MG tablet TAKE 1 TABLET BY MOUTH DAILY AS DIRECTED, ALTERNATING 40MG  EVERY OTHER DAY 12/26/23   Mast, Man X, NP  insulin glargine, 2 Unit Dial, (TOUJEO MAX SOLOSTAR) 300 UNIT/ML Solostar Pen Inject 35 Units into the skin daily. 12/26/23   Venita Sheffield, MD  metoprolol succinate (TOPROL-XL) 100 MG 24 hr tablet TAKE 1 TABLET(100 MG) BY MOUTH TWICE DAILY 12/26/23   Mast, Man X, NP  olmesartan (BENICAR) 40 MG tablet TAKE 1 TABLET(40 MG) BY MOUTH DAILY 12/26/23   Mast, Man X, NP  potassium chloride (KLOR-CON M) 10 MEQ tablet TAKE 1 TABLET(10 MEQ) BY MOUTH TWICE DAILY 12/26/23   Mast, Man X, NP                                                                                                                                    Past Surgical History Past Surgical History:  Procedure Laterality Date   ABDOMINAL HYSTERECTOMY     BIV PACEMAKER GENERATOR CHANGEOUT N/A 08/30/2022   Procedure: BIV PACEMAKER GENERATOR CHANGEOUT;  Surgeon: Duke Salvia, MD;  Location: [facility] INVASIVE CV LAB;  Service: Cardiovascular;  Laterality: N/A;   BREAST LUMPECTOMY WITH RADIOACTIVE SEED LOCALIZATION Right 01/05/2023   Procedure: RIGHT BREAST BRACKETED LUMPECTOMY WITH RADIOACTIVE SEED LOCALIZATION;  Surgeon: Abigail Miyamoto, MD;  Location: [facility] OR;  Service: General;  Laterality: Right;   CARDIAC CATHETERIZATION  08/2011   CAST APPLICATION Right 09/06/2023   Procedure: CAST APPLICATION;  Surgeon: Tarry Kos, MD;  Location: [facility] OR;  Service: Orthopedics;  Laterality: Right;    CHOLECYSTECTOMY     COLONOSCOPY     EP Study  08/22/08   I & D EXTREMITY Right 09/06/2023   Procedure: IRRIGATION AND DEBRIDEMENT RIGHT ANKLE;  Surgeon: Tarry Kos, MD;  Location: [facility] OR;  Service: Orthopedics;  Laterality: Right;   IMPLANTABLE CARDIOVERTER DEFIBRILLATOR GENERATOR CHANGE N/A 08/22/2013   Procedure: IMPLANTABLE CARDIOVERTER DEFIBRILLATOR GENERATOR CHANGE;  Surgeon: Duke Salvia, MD;  Location: [facility] CATH LAB;  Service: Cardiovascular;  Laterality: N/A;   KNEE ARTHROSCOPY Left 04/14/2016  Procedure: LEFT KNEE ARTHROSCOPY, PARTIAL MEDIAL AND PARTIAL LATERAL MENISCECTOMY, MEDIAL PLICA;  Surgeon: Jodi Geralds, MD;  Location: [facility] OR;  Service: Orthopedics;  Laterality: Left;   mastoid sugery     in the setting of Meniere's disease   ORIF ANKLE FRACTURE Right 09/09/2023   Procedure: OPEN REDUCTION INTERNAL FIXATION (ORIF) ANKLE FRACTURE;  Surgeon: [name], MD;  Location: [facility] OR;  Service: Orthopedics;  Laterality: Right;   TONSILLECTOMY AND ADENOIDECTOMY     Family History Family History  Problem Relation Age of Onset   Breast cancer Mother 18       met to lungs   Emphysema Father 1   Heart disease Father    Healthy Child    Lung cancer Cousin        maternal first cousin, she smoked   Colon cancer Neg Hx    Rectal cancer Neg Hx    Stomach cancer Neg Hx    Liver cancer Neg Hx    Esophageal cancer Neg Hx     Social History Social History   Tobacco Use   Smoking status: Former    Types: Cigarettes    Passive exposure: Never   Smokeless tobacco: Never   Tobacco comments:    quit in 1970  Vaping Use   Vaping status: Never Used  Substance Use Topics   Alcohol use: No   Drug use: No   Allergies Coreg [carvedilol], Firvanq [vancomycin], Lactose intolerance (gi), Lanoxin [digoxin], and Sudafed [pseudoephedrine]  Review of Systems Review of Systems  All other systems reviewed and are negative.   Physical Exam Vital Signs  I have reviewed the triage vital  signs BP (!) 191/81   Pulse 64   Temp 97.8 F (36.6 C) (Oral)   Resp 19   SpO2 100%  Physical Exam Vitals and nursing note reviewed.  Constitutional:      General: She is not in acute distress.    Appearance: She is well-developed.  HENT:     Head: Normocephalic and atraumatic.     Mouth/Throat:     Mouth: Mucous membranes are moist.  Eyes:     Pupils: Pupils are equal, round, and reactive to light.  Cardiovascular:     Rate and Rhythm: Normal rate and regular rhythm.     Heart sounds: No murmur heard. Pulmonary:     Effort: Pulmonary effort is normal. No respiratory distress.     Breath sounds: Normal breath sounds.  Abdominal:     General: Abdomen is flat.     Palpations: Abdomen is soft.     Tenderness: There is no abdominal tenderness.  Musculoskeletal:        General: No tenderness.     Right lower leg: No edema.     Left lower leg: No edema.  Skin:    General: Skin is warm and dry.  Neurological:     General: No focal deficit present.     Mental Status: She is alert. Mental status is at baseline.     Comments: Cranial nerves II through XII intact, strength 5 out of 5 in the bilateral upper and lower extremities, no sensory deficit to light touch, no dysmetria on finger-nose-finger testing  Psychiatric:        Mood and Affect: Mood normal.        Behavior: Behavior normal.     ED Results and Treatments Labs (all labs ordered are listed, but only abnormal results are displayed) Labs Reviewed  BASIC METABOLIC PANEL -  Abnormal; Notable for the following components:      Result Value   Glucose, Bld 283 (*)    All other components within normal limits  CBC WITH DIFFERENTIAL/PLATELET - Abnormal; Notable for the following components:   Lymphs Abs 0.6 (*)    All other components within normal limits  CBG MONITORING, ED - Abnormal; Notable for the following components:   Glucose-Capillary 342 (*)    All other components within normal limits                                                                                                                           Radiology CT Head Wo Contrast Result Date: 01/20/2024 CLINICAL DATA:  Headache, hypertension. EXAM: CT HEAD WITHOUT CONTRAST TECHNIQUE: Contiguous axial images were obtained from the base of the skull through the vertex without intravenous contrast. RADIATION DOSE REDUCTION: This exam was performed according to the departmental dose-optimization program which includes automated exposure control, adjustment of the mA and/or kV according to patient size and/or use of iterative reconstruction technique. COMPARISON:  CT head 09/29/2022. FINDINGS: Brain: No acute intracranial hemorrhage. No CT evidence of acute infarct. Nonspecific hypoattenuation in the periventricular and subcortical white matter favored to reflect chronic microvascular ischemic changes. No edema, mass effect, or midline shift. The basilar cisterns are patent. Ventricles: Prominence of the ventricles suggesting underlying parenchymal volume loss. Vascular: Atherosclerotic calcifications of the carotid siphons. No hyperdense vessel. Skull: No acute or aggressive finding. Orbits: Orbits are symmetric. Sinuses: The visualized paranasal sinuses are clear. Other: Postsurgical changes of left canal wall up mastoidectomy. Mastoid air cells are clear. IMPRESSION: No CT evidence of acute intracranial abnormality. Similar chronic microvascular ischemic changes and parenchymal volume loss. Electronically Signed   By: Emily Filbert M.D.   On: 01/20/2024 11:55    Pertinent labs & imaging results that were available during my care of the patient were reviewed by me and considered in my medical decision making (see MDM for details).  Medications Ordered in ED Medications  sodium chloride 0.9 % bolus 1,000 mL (0 mLs Intravenous Stopped 01/20/24 1518)                                                                                                                                      Procedures Procedures  (including critical care time)  Medical Decision Making / ED Course   MDM:  89 year old presenting to the emergency department with high blood pressure.  Patient overall well-appearing, examination is unremarkable.  Blood pressure is somewhat elevated here.  Patient essentially asymptomatic, did endorse episode of headache this morning, so we will check CT head given age and high blood pressure, but overall very low concern for intracranial process such as tumor, stroke, subarachnoid hemorrhage, intracranial bleeding.  Neurologic exam is reassuring and patient asymptomatic at this time.  Will check basic labs as well as given mild hyperglycemia and give IV fluids for some slight dehydration.  Given patient asymptomatic, if workup is reassuring, anticipate discharge  Clinical Course as of 01/20/24 1555  Fri Jan 20, 2024  1554 Imaging negative.  Labs with improving hyper glycemia after fluids.  Patient feels well, tolerating p.o.  Low concern for acute process.  Recommended that she follow-up with her primary care doctor at her assisted living facility.  Patient feels comfortable with this. Will discharge patient to home. All questions answered. Patient comfortable with plan of discharge. Return precautions discussed with patient and specified on the after visit summary.  [WS]    Clinical Course User Index [WS] Lonell Grandchild, MD     Additional history obtained: -Additional history obtained from ems -External records from outside source obtained and reviewed including: Chart review including previous notes, labs, imaging, consultation notes including prior notes    Lab Tests: -I ordered, reviewed, and interpreted labs.   The pertinent results include:   Labs Reviewed  BASIC METABOLIC PANEL - Abnormal; Notable for the following components:      Result Value   Glucose, Bld 283 (*)    All other components within normal limits  CBC WITH  DIFFERENTIAL/PLATELET - Abnormal; Notable for the following components:   Lymphs Abs 0.6 (*)    All other components within normal limits  CBG MONITORING, ED - Abnormal; Notable for the following components:   Glucose-Capillary 342 (*)    All other components within normal limits    Notable for mild hyperglycemia   Imaging Studies ordered: I ordered imaging studies including CT head On my interpretation imaging demonstrates no acute process I independently visualized and interpreted imaging. I agree with the radiologist interpretation  Reevaluation: After the interventions noted above, I reevaluated the patient and found that their symptoms have improved  Co morbidities that complicate the patient evaluation  Past Medical History:  Diagnosis Date   Biventricular ICD (implantable cardiac defibrillator) in place    downgraded from CRT-D to CRT-P 08/30/22   Breast cancer (HCC)    Cellulitis    Change in bowel habits    CHF (congestive heart failure) (HCC)    Colon polyps    Diabetes mellitus    Type II   Diarrhea    Dyslipidemia    HOH (hard of hearing)    left   HTN (hypertension)    LBBB (left bundle branch block)    chronic   Meniere's disease    Nonischemic cardiomyopathy (HCC) 2006   Presence of permanent cardiac pacemaker    T wave oversensing 01/17/2014   Ulcerative colitis    Varicose veins    4 lazer  treatment each leg      Dispostion: Disposition decision including need for hospitalization was considered, and patient discharged from emergency department.    Final Clinical Impression(s) / ED Diagnoses Final diagnoses:  Hypertension, unspecified type     This chart was dictated using voice recognition software.  Despite best efforts to proofread,  errors  can occur which can change the documentation meaning.    Lonell Grandchild, MD 01/20/24 [phone]

## 2024-01-20 NOTE — ED Triage Notes (Signed)
 Per EMS from [facility]. Hx of hypertension.  229/90 at 830a. Otherwise asymptomatic. Hx type 2 diabetes. Hx of breast cancer.  BP 200/100 HR 80 RR 18 CBG 380 100 on RA

## 2024-01-23 ENCOUNTER — Ambulatory Visit: Payer: Medicare Other | Attending: Internal Medicine

## 2024-01-23 ENCOUNTER — Encounter: Payer: Self-pay | Admitting: Sports Medicine

## 2024-01-23 ENCOUNTER — Non-Acute Institutional Stay: Payer: Self-pay | Admitting: Sports Medicine

## 2024-01-23 DIAGNOSIS — Z950 Presence of cardiac pacemaker: Secondary | ICD-10-CM

## 2024-01-23 DIAGNOSIS — E1165 Type 2 diabetes mellitus with hyperglycemia: Secondary | ICD-10-CM

## 2024-01-23 DIAGNOSIS — I428 Other cardiomyopathies: Secondary | ICD-10-CM | POA: Diagnosis not present

## 2024-01-23 DIAGNOSIS — I5022 Chronic systolic (congestive) heart failure: Secondary | ICD-10-CM | POA: Diagnosis not present

## 2024-01-23 DIAGNOSIS — I10 Essential (primary) hypertension: Secondary | ICD-10-CM | POA: Diagnosis not present

## 2024-01-23 DIAGNOSIS — Z794 Long term (current) use of insulin: Secondary | ICD-10-CM

## 2024-01-23 NOTE — Progress Notes (Signed)
 Provider:  Dr. Venita Sheffield Location:  [facility] Place of Service:    Assisted living   PCP: Mast, Man X, NP Patient Care Team: Mast, Man X, NP as PCP - General (Internal Medicine) Duke Salvia, MD as PCP - Electrophysiology (Cardiology) Abigail Miyamoto, MD as Consulting Physician (General Surgery) Rachel Moulds, MD as Consulting Physician (Hematology and Oncology) Dorothy Puffer, MD as Consulting Physician (Radiation Oncology)  Extended Emergency Contact Information Primary Emergency Contact: [name] Address: [address]          [place], [place] [address] [place] of [place] Home Phone: [phone] Mobile Phone: [phone] Relation: Son  Goals of Care: Advanced Directive information    01/20/2024   10:16 AM  Advanced Directives  Does Patient Have a Medical Advance Directive? Yes  Type of Advance Directive Healthcare Power of Attorney  Does patient want to make changes to medical advance directive? No - Patient declined  Copy of Healthcare Power of Attorney in Chart? No - copy requested       History of Present Illness           89 year old female with a history of NICM, chronic systolic heart failure, diabetes, hypertension, hyperlipidemia is seen today for ED hospital follow-up visit. Patient seen and examined in her room.  She is laying on her bed. States she is feeling tired.  Did not eat her breakfast. Patient denies headache, nausea, vomiting, blurry or double vision Denies chest pain, shortness of breath, palpitations, abdominal pain, dysuria, hematuria, bloody or dark-colored stools. Patient was sent to the emergency room room for elevated blood pressure, high blood glucose. She had a CT head which was unremarkable. Patient's blood sugars been spiking high.   Past Medical History:  Diagnosis Date   Biventricular ICD (implantable cardiac defibrillator) in place    downgraded from CRT-D to CRT-P 08/30/22   Breast  cancer (HCC)    Cellulitis    Change in bowel habits    CHF (congestive heart failure) (HCC)    Colon polyps    Diabetes mellitus    Type II   Diarrhea    Dyslipidemia    HOH (hard of hearing)    left   HTN (hypertension)    LBBB (left bundle branch block)    chronic   Meniere's disease    Nonischemic cardiomyopathy (HCC) 2006   Presence of permanent cardiac pacemaker    T wave oversensing 01/17/2014   Ulcerative colitis    Varicose veins    4 lazer  treatment each leg   Past Surgical History:  Procedure Laterality Date   ABDOMINAL HYSTERECTOMY     BIV PACEMAKER GENERATOR CHANGEOUT N/A 08/30/2022   Procedure: BIV PACEMAKER GENERATOR CHANGEOUT;  Surgeon: Duke Salvia, MD;  Location: [facility] INVASIVE CV LAB;  Service: Cardiovascular;  Laterality: N/A;   BREAST LUMPECTOMY WITH RADIOACTIVE SEED LOCALIZATION Right 01/05/2023   Procedure: RIGHT BREAST BRACKETED LUMPECTOMY WITH RADIOACTIVE SEED LOCALIZATION;  Surgeon: Abigail Miyamoto, MD;  Location: [facility] OR;  Service: General;  Laterality: Right;   CARDIAC CATHETERIZATION  08/2011   CAST APPLICATION Right 09/06/2023   Procedure: CAST APPLICATION;  Surgeon: Tarry Kos, MD;  Location: [facility] OR;  Service: Orthopedics;  Laterality: Right;   CHOLECYSTECTOMY     COLONOSCOPY     EP Study  08/22/08   I & D EXTREMITY Right 09/06/2023   Procedure: IRRIGATION AND DEBRIDEMENT RIGHT ANKLE;  Surgeon: Tarry Kos, MD;  Location: [facility] OR;  Service: Orthopedics;  Laterality: Right;   IMPLANTABLE CARDIOVERTER DEFIBRILLATOR GENERATOR CHANGE N/A 08/22/2013   Procedure: IMPLANTABLE CARDIOVERTER DEFIBRILLATOR GENERATOR CHANGE;  Surgeon: Duke Salvia, MD;  Location: [facility] CATH LAB;  Service: Cardiovascular;  Laterality: N/A;   KNEE ARTHROSCOPY Left 04/14/2016   Procedure: LEFT KNEE ARTHROSCOPY, PARTIAL MEDIAL AND PARTIAL LATERAL MENISCECTOMY, MEDIAL PLICA;  Surgeon: Jodi Geralds, MD;  Location: [facility] OR;  Service: Orthopedics;  Laterality: Left;   mastoid sugery      in the setting of Meniere's disease   ORIF ANKLE FRACTURE Right 09/09/2023   Procedure: OPEN REDUCTION INTERNAL FIXATION (ORIF) ANKLE FRACTURE;  Surgeon: [name], MD;  Location[facility] OR;  Service: Orthopedics;  Laterality: Right;   TONSILLECTOMY AND ADENOIDECTOMY      reports that she has quit smoking. Her smoking use included cigarettes. She has never been exposed to tobacco smoke. She has never used smokeless tobacco. She reports that she does not drink alcohol and does not use drugs. 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    Social Drivers of Corporate investment banker Strain: Low Risk  (12/08/2022)   Overall Financial Resource Strain (CARDIA)    Difficulty of Paying Living Expenses: Not hard at all  Food Insecurity: No Food Insecurity (09/13/2023)   Hunger Vital Sign    Worried About Running Out of Food in the Last Year: Never true    Ran Out of Food in the Last Year: Never true  Transportation Needs: No Transportation Needs (09/13/2023)   PRAPARE - Administrator, Civil Service (Medical): No    Lack of Transportation (Non-Medical): No  Physical Activity: Not on file  Stress: Not on file  Social Connections: Not on file  Intimate Partner Violence: Not At Risk (09/13/2023)   Humiliation,  Afraid, Rape, and Kick questionnaire    Fear of Current or Ex-Partner: No    Emotionally Abused: No    Physically Abused: No    Sexually Abused: No    Functional Status Survey:    Family History  Problem Relation Age of Onset   Breast cancer Mother 102       met to lungs   Emphysema Father 2   Heart disease Father    Healthy Child    Lung cancer Cousin        maternal first cousin, she smoked   Colon cancer Neg Hx    Rectal cancer Neg Hx    Stomach cancer Neg Hx    Liver cancer Neg Hx    Esophageal cancer Neg Hx     Health Maintenance  Topic Date Due   FOOT EXAM  Never done   OPHTHALMOLOGY EXAM  Never done   Zoster Vaccines- Shingrix (2 of 2) 05/02/2023   HEMOGLOBIN A1C  08/27/2023   COVID-19 Vaccine (7 - 2024-25 season) 10/12/2023   Medicare Annual Wellness (AWV)  03/01/2024   DTaP/Tdap/Td (3 - Td or Tdap) 09/05/2033   Pneumonia Vaccine 80+ Years old  Completed   INFLUENZA VACCINE  Completed   DEXA SCAN  Completed   HPV VACCINES  Aged Out    Allergies  Allergen Reactions   Coreg [Carvedilol] Other (See Comments)    Unknown reaction   Firvanq [Vancomycin] Itching and Other (See Comments)    Red Man Syndrome   Lactose Intolerance (Gi)    Lanoxin [Digoxin] Other (See Comments)    DIG toxicity   Sudafed [Pseudoephedrine] Other (See Comments)    Makes skin feel like its crawling     Outpatient Encounter Medications as of 01/23/2024  Medication Sig   acetaminophen (TYLENOL) 500 MG tablet Take 500 mg by mouth every 8 (eight) hours as needed for headache.   alendronate (FOSAMAX) 70 MG tablet TAKE 1 TABLET(70 MG) BY MOUTH EVERY 7 DAYS WITH A FULL GLASS OF WATER AND ON AN EMPTY STOMACH   aspirin EC 81 MG tablet Take 81 mg by mouth daily.   aspirin EC 81 MG tablet Take 1 tablet (81 mg total) by mouth daily. Swallow whole.   atorvastatin (LIPITOR) 40 MG tablet TAKE 1 TABLET(40 MG) BY MOUTH DAILY   diclofenac Sodium (VOLTAREN ARTHRITIS PAIN) 1 % GEL Apply 2 g  topically 4 (four) times daily. Apply to R ankle topically four times a day for Reddened areas APPLY AWAY FROM OPENED AREAS   furosemide (LASIX) 20 MG tablet TAKE 1 TABLET BY MOUTH DAILY AS DIRECTED, ALTERNATING 40MG  EVERY OTHER DAY   insulin glargine, 2 Unit Dial, (TOUJEO MAX SOLOSTAR) 300 UNIT/ML Solostar Pen Inject 35 Units into the skin daily.   metoprolol succinate (TOPROL-XL) 100 MG 24 hr tablet TAKE 1 TABLET(100 MG) BY MOUTH TWICE DAILY   olmesartan (BENICAR) 40 MG tablet TAKE 1 TABLET(40 MG) BY MOUTH DAILY   potassium chloride (KLOR-CON M) 10 MEQ tablet TAKE 1 TABLET(10 MEQ) BY MOUTH TWICE DAILY   No facility-administered encounter medications on file as of 01/23/2024.    Review of Systems  Constitutional:  Positive for fatigue. Negative for fever.  HENT:  Negative for sinus pressure and sore throat.   Respiratory:  Negative for cough, shortness of breath and wheezing.   Cardiovascular:  Negative for chest pain, palpitations and leg swelling.  Gastrointestinal:  Negative for abdominal distention, abdominal pain, blood in stool, constipation, diarrhea, nausea and vomiting.  Genitourinary:  Negative for dysuria.  Neurological:  Negative for dizziness.  Psychiatric/Behavioral:  Negative for confusion.    Negative unless indicated in HPI.  There were no vitals filed for this visit. There is no height or weight on file to calculate BMI. BP Readings from Last 3 Encounters:  01/20/24 (!) 191/81  12/23/23 (!) 140/70  12/05/23 (!) 142/78   Wt Readings from Last 3 Encounters:  12/23/23 131 lb (59.4 kg)  12/05/23 133 lb 8 oz (60.6 kg)  11/21/23 132 lb 9.6 oz (60.1 kg)   Physical Exam Constitutional:      Appearance: Normal appearance.  HENT:     Head: Normocephalic and atraumatic.  Cardiovascular:     Rate and Rhythm: Normal rate and regular rhythm.  Pulmonary:     Effort: Pulmonary effort is normal. No respiratory distress.     Breath sounds: Normal breath  sounds. No wheezing.   Abdominal:     General: Bowel sounds are normal. There is no distension.     Tenderness: There is no abdominal tenderness. There is no guarding or rebound.     Comments:    Musculoskeletal:        General: No swelling.  Neurological:     Mental Status: She is alert. Mental status is at baseline.     Motor: No weakness.     Labs reviewed: Basic Metabolic Panel: Recent Labs    09/08/23 0615 09/09/23 0559 09/10/23 0530 09/27/23 0000 10/07/23 0000 01/20/24 1346  NA 140 141 138 137 142 137  K 3.3* 4.3 3.8 4.1 4.2 3.7  CL 106 108 102 99 105 99  CO2 24 25 25  28* 28* 26  GLUCOSE 199* 143* 181*  --   --  283*  BUN 25* 23 22 21 17 22   CREATININE 0.93 0.94 0.92 1.0 0.9 0.85  CALCIUM 8.5* 8.7* 8.3* 8.6* 8.5* 9.0  MG 2.0 2.1 2.2  --   --   --    Liver Function Tests: Recent Labs    02/25/23 0000 09/27/23 0000 09/28/23 0000 10/07/23 0000  AST 16  --  15 20  ALT 10  --  16 16  ALKPHOS 62  --  147* 103  ALBUMIN 3.8 3.3*  --  3.3*   No results for input(s): "LIPASE", "AMYLASE" in the last 8760 hours. No results for input(s): "AMMONIA" in the last 8760 hours. CBC: Recent Labs    09/09/23 0559 09/10/23 0530 09/28/23 0000 10/07/23 0000 01/20/24 1346  WBC 11.9* 8.1 4.9 4.6 6.8  NEUTROABS  --   --  3,244.00 2,838.00 5.6  HGB 11.6* 10.5* 11.0* 11.6* 14.0  HCT 34.9* 31.9* 34* 35* 43.7  MCV 97.5 95.8  --   --  97.5  PLT 136* 119* 138* 145* 157   Cardiac Enzymes: No results for input(s): "CKTOTAL", "CKMB", "CKMBINDEX", "TROPONINI" in the last 8760 hours. BNP: Invalid input(s): "POCBNP" Lab Results  Component Value Date   HGBA1C 7.5 02/25/2023   Lab Results  Component Value Date   TSH 1.93 02/25/2023   Lab Results  Component Value Date   VITAMINB12 414 08/22/2023   Lab Results  Component Value Date   FOLATE 19.7 03/02/2022   No results found for: "IRON", "TIBC", "FERRITIN"  Imaging and Procedures obtained prior to SNF admission: CT Head Wo Contrast Result  Date: 01/20/2024 CLINICAL DATA:  Headache, hypertension. EXAM: CT HEAD WITHOUT CONTRAST TECHNIQUE: Contiguous axial images were obtained from the base of the skull through the vertex without intravenous contrast. RADIATION DOSE REDUCTION: This exam was performed according to the departmental dose-optimization program which includes automated exposure control, adjustment of the mA and/or kV according to patient size and/or use of iterative reconstruction technique. COMPARISON:  CT head 09/29/2022. FINDINGS: Brain: No acute intracranial hemorrhage. No CT evidence of acute infarct. Nonspecific hypoattenuation in the periventricular and subcortical white matter favored to reflect chronic microvascular ischemic changes. No edema, mass effect, or midline shift. The basilar cisterns are patent. Ventricles: Prominence of the ventricles suggesting underlying parenchymal volume loss. Vascular: Atherosclerotic calcifications of the carotid siphons. No hyperdense vessel. Skull: No acute or aggressive finding. Orbits: Orbits are symmetric. Sinuses: The visualized paranasal sinuses are clear. Other: Postsurgical changes of left canal wall up mastoidectomy. Mastoid air cells are clear. IMPRESSION: No CT evidence of acute intracranial abnormality. Similar chronic microvascular ischemic changes and parenchymal volume loss. Electronically Signed  By: Emily Filbert M.D.   On: 01/20/2024 11:55    Assessment and Plan     Diabetes mellitus Lab Results  Component Value Date   HGBA1C 7.5 02/25/2023   HGBA1C 7.5 (H) 12/31/2022   HGBA1C 7.4 (H) 11/10/2020   01/23/2024 11:22 146 mg/dL High of 16.1 exceeded 09/60/4540 07:57 168 mg/dL High of 98.1 exceeded 19/14/7829 20:50 500 mg/dL High of 56.2 exceeded 13/06/6577 15:28 337 mg/dL High of 46.9 exceeded 62/95/2841 11:50 201 mg/dL High of 32.4 exceeded 40/08/2724 09:36 252 mg/dL High of 36.6 exceeded 44/12/4740 20:28 382 mg/dL High of 59.5 exceeded 63/87/5643 16:24 284 mg/dL  High of 32.9 exceeded 01/21/2024 11:40 188 mg/dL High of 51.8 exceeded 84/16/6063 08:26 185 mg/dL   Blood sugars been running high Will increase Toujeo to 45 units Continue with sliding scale insulin Monitor blood sugar twice daily Avoid high carbohydrate, sweets   Hypertension Blood pressure 152/73. add amlodipine 5 mg Continue with hydralazine, metoprolol, olmesartan Monitor blood pressure daily Avoid salty foods   History of congestive heart failure Patient does not appear to be in distress Euvolemic on exam today Lungs clear, no lower extremity swelling Continue with the Lasix Avoid salty foods  NICM Coronary artery disease Continue with aspirin, Lipitor   40 min Total time spent for obtaining history,  performing a medically appropriate examination and evaluation, reviewing the tests,   documenting clinical information in the electronic or other health record,  (not separately reported)

## 2024-01-25 NOTE — Progress Notes (Signed)
 EPIC Encounter for ICM Monitoring  Patient Name: Grace Robinson is a 89 y.o. female Date: 01/25/2024 Primary Care Physican: Mast, Man X, NP Primary Cardiologist: Graciela Husbands Electrophysiologist: Joycelyn Schmid Pacing:  97.9%     12/24/2022 Weight: 130 lbs   Clinical Status (19-Dec-2023 to 22-Jan-2024) Time in AT/AF  <0.1 hr/day (<0.1%)           Transmission results reviewed.    Optivol Thoracic impedance suggesting intermittent days with possible fluid accumulation within the last month.   Prescribed:  Furosemide 20 mg take 1 tablet (20 mg total) by mouth daily alternating (40 mg) every other day.   Potassium 10 mEq 1 tablet twice a day.   Labs: 01/20/2024 Creatinine 0.85, BUN 22, Potassium 3.7, Sodium 137, GFR >60 10/07/2023 Creatinine 0.9,   BUN 17, Potassium 4.2, Sodium 142  09/27/2023 Creatinine 1.0,   BUN 21, Potassium 4.1, Sodium 137  09/10/2023 Creatinine 0.92, BUN 22, Potassium 3.8, Sodium 138  A complete set of results can be found in Results Review.   Recommendations:  None   Follow-up plan: ICM clinic phone appointment on 03/02/2024.   91 day device clinic remote transmission 02/29/2024.      EP/Cardiology Office Visits:  Recall 06/02/2024 with Canary Brim, NP.       Copy of ICM check sent to Dr. Graciela Husbands.    3 month ICM trend: 01/23/2024.    12-14 Month ICM trend:     Karie Soda, RN 01/25/2024 1:38 PM

## 2024-01-31 ENCOUNTER — Encounter: Payer: Self-pay | Admitting: Hematology and Oncology

## 2024-01-31 ENCOUNTER — Other Ambulatory Visit: Payer: Self-pay | Admitting: *Deleted

## 2024-01-31 DIAGNOSIS — D0511 Intraductal carcinoma in situ of right breast: Secondary | ICD-10-CM

## 2024-01-31 DIAGNOSIS — Z9889 Other specified postprocedural states: Secondary | ICD-10-CM

## 2024-02-03 ENCOUNTER — Non-Acute Institutional Stay: Payer: Self-pay | Admitting: Sports Medicine

## 2024-02-03 ENCOUNTER — Encounter: Payer: Self-pay | Admitting: Sports Medicine

## 2024-02-03 DIAGNOSIS — I10 Essential (primary) hypertension: Secondary | ICD-10-CM | POA: Diagnosis not present

## 2024-02-03 DIAGNOSIS — E11649 Type 2 diabetes mellitus with hypoglycemia without coma: Secondary | ICD-10-CM | POA: Diagnosis not present

## 2024-02-03 DIAGNOSIS — Z794 Long term (current) use of insulin: Secondary | ICD-10-CM

## 2024-02-03 NOTE — Progress Notes (Signed)
 Provider:  Dr. Venita Sheffield Location:  [facility] Place of Service:   Assisted living    PCP: Mast, Man X, NP Patient Care Team: Mast, Man X, NP as PCP - General (Internal Medicine) Duke Salvia, MD as PCP - Electrophysiology (Cardiology) Abigail Miyamoto, MD as Consulting Physician (General Surgery) Rachel Moulds, MD as Consulting Physician (Hematology and Oncology) Dorothy Puffer, MD as Consulting Physician (Radiation Oncology)  Extended Emergency Contact Information Primary Emergency Contact: [name] Address: [address]          [place], [place] [address] [place] of [place] Home Phone: [phone] Mobile Phone: [phone] Relation: Son  Goals of Care: Advanced Directive information    01/20/2024   10:16 AM  Advanced Directives  Does Patient Have a Medical Advance Directive? Yes  Type of Advance Directive Healthcare Power of Attorney  Does patient want to make changes to medical advance directive? No - Patient declined  Copy of Healthcare Power of Attorney in Chart? No - copy requested      No chief complaint on file.     History of Present Illness   Pt seen and examined in her room  She seems pleasant and comfortable and does not appear to be in distress Pt is getting ready to go to her lunch  Denies chest pain, palpitations, SOB, dizziness, abdominal pain, nausea, vomiting, dysuria, hematuria, bloody or dark stools.  Staff reported that her BG running low in 70's  Pt reports that some times she eats about 50%of  her evening meal and the BG runs low the  following day  Past Medical History:  Diagnosis Date   Biventricular ICD (implantable cardiac defibrillator) in place    downgraded from CRT-D to CRT-P 08/30/22   Breast cancer (HCC)    Cellulitis    Change in bowel habits    CHF (congestive heart failure) (HCC)    Colon polyps    Diabetes mellitus    Type II   Diarrhea    Dyslipidemia    HOH (hard of  hearing)    left   HTN (hypertension)    LBBB (left bundle branch block)    chronic   Meniere's disease    Nonischemic cardiomyopathy (HCC) 2006   Presence of permanent cardiac pacemaker    T wave oversensing 01/17/2014   Ulcerative colitis    Varicose veins    4 lazer  treatment each leg   Past Surgical History:  Procedure Laterality Date   ABDOMINAL HYSTERECTOMY     BIV PACEMAKER GENERATOR CHANGEOUT N/A 08/30/2022   Procedure: BIV PACEMAKER GENERATOR CHANGEOUT;  Surgeon: Duke Salvia, MD;  Location: [facility] INVASIVE CV LAB;  Service: Cardiovascular;  Laterality: N/A;   BREAST LUMPECTOMY WITH RADIOACTIVE SEED LOCALIZATION Right 01/05/2023   Procedure: RIGHT BREAST BRACKETED LUMPECTOMY WITH RADIOACTIVE SEED LOCALIZATION;  Surgeon: Abigail Miyamoto, MD;  Location: [facility] OR;  Service: General;  Laterality: Right;   CARDIAC CATHETERIZATION  08/2011   CAST APPLICATION Right 09/06/2023   Procedure: CAST APPLICATION;  Surgeon: Tarry Kos, MD;  Location: [facility] OR;  Service: Orthopedics;  Laterality: Right;   CHOLECYSTECTOMY     COLONOSCOPY     EP Study  08/22/08   I & D EXTREMITY Right 09/06/2023   Procedure: IRRIGATION AND DEBRIDEMENT RIGHT ANKLE;  Surgeon: Tarry Kos, MD;  Location: [facility] OR;  Service: Orthopedics;  Laterality: Right;   IMPLANTABLE CARDIOVERTER DEFIBRILLATOR GENERATOR CHANGE N/A 08/22/2013   Procedure: IMPLANTABLE CARDIOVERTER DEFIBRILLATOR GENERATOR CHANGE;  Surgeon: Duke Salvia, MD;  Location: [facility] CATH LAB;  Service: Cardiovascular;  Laterality: N/A;   KNEE ARTHROSCOPY Left 04/14/2016   Procedure: LEFT KNEE ARTHROSCOPY, PARTIAL MEDIAL AND PARTIAL LATERAL MENISCECTOMY, MEDIAL PLICA;  Surgeon: Jodi Geralds, MD;  Location: [facility] OR;  Service: Orthopedics;  Laterality: Left;   mastoid sugery     in the setting of Meniere's disease   ORIF ANKLE FRACTURE Right 09/09/2023   Procedure: OPEN REDUCTION INTERNAL FIXATION (ORIF) ANKLE FRACTURE;  Surgeon: [name], MD;  Location: [facility] OR;   Service: Orthopedics;  Laterality: Right;   TONSILLECTOMY AND ADENOIDECTOMY      reports that she has quit smoking. Her smoking use included cigarettes. She has never been exposed to tobacco smoke. She has never used smokeless tobacco. She reports that she does not drink alcohol and does not use drugs. 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    Social Drivers of Corporate investment banker Strain: Low Risk  (12/08/2022)   Overall Financial Resource Strain (CARDIA)    Difficulty of Paying Living Expenses: Not hard at all  Food Insecurity: No Food Insecurity (09/13/2023)   Hunger Vital Sign    Worried About Running Out of Food in the Last Year: Never true    Ran Out of Food in the Last Year: Never true  Transportation Needs: No Transportation Needs (09/13/2023)   PRAPARE - Administrator, Civil Service (Medical): No    Lack of Transportation (Non-Medical): No  Physical Activity: Not on file  Stress: Not on file  Social Connections: Not on file  Intimate Partner Violence: Not At Risk (09/13/2023)   Humiliation, Afraid, Rape, and Kick questionnaire    Fear of Current or Ex-Partner: No    Emotionally Abused: No    Physically Abused: No    Sexually Abused: No    Functional Status Survey:     Family History  Problem Relation Age of Onset   Breast cancer Mother 1       met to lungs   Emphysema Father 34   Heart disease Father    Healthy Child    Lung cancer Cousin        maternal first cousin, she smoked   Colon cancer Neg Hx    Rectal cancer Neg Hx    Stomach cancer Neg Hx    Liver cancer Neg Hx    Esophageal cancer Neg Hx     Health Maintenance  Topic Date Due   FOOT EXAM  Never done   OPHTHALMOLOGY EXAM  Never done   Zoster Vaccines- Shingrix (2 of 2) 05/02/2023   HEMOGLOBIN A1C  08/27/2023   COVID-19  Vaccine (7 - 2024-25 season) 10/12/2023   Medicare Annual Wellness (AWV)  03/01/2024   INFLUENZA VACCINE  06/01/2024   DTaP/Tdap/Td (3 - Td or Tdap) 09/05/2033   Pneumonia Vaccine 26+ Years old  Completed   DEXA SCAN  Completed   HPV VACCINES  Aged Out    Allergies  Allergen Reactions   Coreg [Carvedilol] Other (See Comments)    Unknown reaction   Firvanq [Vancomycin] Itching and Other (See Comments)    Red Man Syndrome   Lactose Intolerance (Gi)    Lanoxin [Digoxin] Other (See Comments)    DIG toxicity   Sudafed [Pseudoephedrine] Other (See Comments)    Makes skin feel like its crawling     Outpatient Encounter Medications as of 02/03/2024  Medication Sig   acetaminophen (TYLENOL) 500 MG tablet Take 500 mg by mouth every 8 (eight) hours as needed for headache.   aspirin EC 81 MG tablet Take 81 mg by mouth daily.   aspirin EC 81 MG tablet Take 1 tablet (81 mg total) by mouth daily. Swallow whole.   atorvastatin (LIPITOR) 40 MG tablet TAKE 1 TABLET(40 MG) BY MOUTH DAILY   diclofenac Sodium (VOLTAREN ARTHRITIS PAIN) 1 % GEL Apply 2 g topically 4 (four) times daily. Apply to R ankle topically four times a day for Reddened areas APPLY AWAY FROM OPENED AREAS   furosemide (LASIX) 20 MG tablet TAKE 1 TABLET BY MOUTH DAILY AS DIRECTED, ALTERNATING 40MG  EVERY OTHER DAY   furosemide (LASIX) 40 MG tablet Take 20 mg by mouth daily.   insulin glargine, 2 Unit  Dial, (TOUJEO MAX SOLOSTAR) 300 UNIT/ML Solostar Pen Inject 35 Units into the skin daily.   metoprolol succinate (TOPROL-XL) 100 MG 24 hr tablet TAKE 1 TABLET(100 MG) BY MOUTH TWICE DAILY   olmesartan (BENICAR) 40 MG tablet TAKE 1 TABLET(40 MG) BY MOUTH DAILY   ondansetron (ZOFRAN) 4 MG tablet Take 4 mg by mouth every 8 (eight) hours as needed.   potassium chloride (KLOR-CON M) 10 MEQ tablet TAKE 1 TABLET(10 MEQ) BY MOUTH TWICE DAILY   potassium chloride (KLOR-CON) 10 MEQ tablet 10 mEq 2 (two) times daily.   No facility-administered encounter medications on file as of 02/03/2024.    Review of Systems  Constitutional:  Negative for chills and fever.  HENT:  Negative for sinus pressure and sore throat.   Respiratory:  Negative for cough, shortness of breath and wheezing.   Cardiovascular:  Negative for chest pain, palpitations and leg swelling.  Gastrointestinal:  Negative for abdominal distention, abdominal pain, blood in stool, constipation, diarrhea, nausea and vomiting.  Genitourinary:  Negative for dysuria, frequency and urgency.  Neurological:  Negative for dizziness, weakness and numbness.  Psychiatric/Behavioral:  Negative for confusion.    Negative unless indicated in HPI.  There were no vitals filed for this visit. There is no height or weight on file to calculate BMI. BP Readings from Last 3 Encounters:  01/23/24 (!) 152/73  01/20/24 (!) 191/81  12/23/23 (!) 140/70   Wt Readings from Last 3 Encounters:  01/23/24 130 lb 3.2 oz (59.1 kg)  12/23/23 131 lb (59.4 kg)  12/05/23 133 lb 8 oz (60.6 kg)   Physical Exam Constitutional:      Appearance: Normal appearance.  HENT:     Head: Normocephalic and atraumatic.  Cardiovascular:     Rate and Rhythm: Normal rate and regular rhythm.  Pulmonary:     Effort: Pulmonary effort is normal. No respiratory distress.     Breath  sounds: Normal breath sounds. No wheezing.  Abdominal:     General: Bowel sounds are normal. There is no  distension.     Tenderness: There is no abdominal tenderness. There is no guarding or rebound.     Comments:    Musculoskeletal:        General: No swelling or tenderness.  Skin:    General: Skin is dry.  Neurological:     Mental Status: She is alert. Mental status is at baseline.     Sensory: No sensory deficit.     Motor: No weakness.     Labs reviewed: Basic Metabolic Panel: Recent Labs    09/08/23 0615 09/09/23 0559 09/10/23 0530 09/27/23 0000 10/07/23 0000 01/20/24 1346  NA 140 141 138 137 142 137  K 3.3* 4.3 3.8 4.1 4.2 3.7  CL 106 108 102 99 105 99  CO2 24 25 25  28* 28* 26  GLUCOSE 199* 143* 181*  --   --  283*  BUN 25* 23 22 21 17 22   CREATININE 0.93 0.94 0.92 1.0 0.9 0.85  CALCIUM 8.5* 8.7* 8.3* 8.6* 8.5* 9.0  MG 2.0 2.1 2.2  --   --   --    Liver Function Tests: Recent Labs    02/25/23 0000 09/27/23 0000 09/28/23 0000 10/07/23 0000  AST 16  --  15 20  ALT 10  --  16 16  ALKPHOS 62  --  147* 103  ALBUMIN 3.8 3.3*  --  3.3*   No results for input(s): "LIPASE", "AMYLASE" in the last 8760 hours. No results for input(s): "AMMONIA" in the last 8760 hours. CBC: Recent Labs    09/09/23 0559 09/10/23 0530 09/28/23 0000 10/07/23 0000 01/20/24 1346  WBC 11.9* 8.1 4.9 4.6 6.8  NEUTROABS  --   --  3,244.00 2,838.00 5.6  HGB 11.6* 10.5* 11.0* 11.6* 14.0  HCT 34.9* 31.9* 34* 35* 43.7  MCV 97.5 95.8  --   --  97.5  PLT 136* 119* 138* 145* 157   Cardiac Enzymes: No results for input(s): "CKTOTAL", "CKMB", "CKMBINDEX", "TROPONINI" in the last 8760 hours. BNP: Invalid input(s): "POCBNP" Lab Results  Component Value Date   HGBA1C 7.5 02/25/2023   Lab Results  Component Value Date   TSH 1.93 02/25/2023   Lab Results  Component Value Date   VITAMINB12 414 08/22/2023   Lab Results  Component Value Date   FOLATE 19.7 03/02/2022   No results found for: "IRON", "TIBC", "FERRITIN"  Imaging and Procedures obtained prior to SNF admission: CT Head Wo  Contrast Result Date: 01/20/2024 CLINICAL DATA:  Headache, hypertension. EXAM: CT HEAD WITHOUT CONTRAST TECHNIQUE: Contiguous axial images were obtained from the base of the skull through the vertex without intravenous contrast. RADIATION DOSE REDUCTION: This exam was performed according to the departmental dose-optimization program which includes automated exposure control, adjustment of the mA and/or kV according to patient size and/or use of iterative reconstruction technique. COMPARISON:  CT head 09/29/2022. FINDINGS: Brain: No acute intracranial hemorrhage. No CT evidence of acute infarct. Nonspecific hypoattenuation in the periventricular and subcortical white matter favored to reflect chronic microvascular ischemic changes. No edema, mass effect, or midline shift. The basilar cisterns are patent. Ventricles: Prominence of the ventricles suggesting underlying parenchymal volume loss. Vascular: Atherosclerotic calcifications of the carotid siphons. No hyperdense vessel. Skull: No acute or aggressive finding. Orbits: Orbits are symmetric. Sinuses: The visualized paranasal sinuses are clear. Other: Postsurgical changes of left canal wall up mastoidectomy. Mastoid air cells are  clear. IMPRESSION: No CT evidence of acute intracranial abnormality. Similar chronic microvascular ischemic changes and parenchymal volume loss. Electronically Signed   By: Emily Filbert M.D.   On: 01/20/2024 11:55    Assessment and Plan Assessment & Plan   1. Type 2 diabetes mellitus with hypoglycemia without coma, with long-term current use of insulin (HCC) (Primary) Staff reported low BG  Instructed staff to reduce Insulin to 50% when patient skips meals or eats < 50% of her meals Cont with sliding scale Monitor for hypoglycemia  2. Primary hypertension Bp at goal  Cont with olmesartan and metoprolol   Other orders - INSULIN ASPART IJ; Inject 10 Units into the skin 3 (three) times daily before meals. Inject as per  sliding scale: if 0 - 400 = 0 if below 400 do not give insulin; 401 - 500 = 10 Give 10 units for CBG 401-500. If CBG is above 500 notify MD/NP., subcutaneously before meals and at bedtime - polyethylene glycol (MIRALAX / GLYCOLAX) 17 g packet; Take 17 g by mouth every 12 (twelve) hours as needed for moderate constipation. - risedronate (ACTONEL) 35 MG tablet; Take 35 mg by mouth every Sunday. Give 1 tablet by mouth one time a day every Sun for post menopause     Family/ staff Communication:   Labs/tests ordered:  Venita Sheffield

## 2024-02-06 ENCOUNTER — Encounter: Payer: Self-pay | Admitting: Sports Medicine

## 2024-02-08 DIAGNOSIS — R92323 Mammographic fibroglandular density, bilateral breasts: Secondary | ICD-10-CM | POA: Diagnosis not present

## 2024-02-08 DIAGNOSIS — Z853 Personal history of malignant neoplasm of breast: Secondary | ICD-10-CM | POA: Diagnosis not present

## 2024-02-09 ENCOUNTER — Non-Acute Institutional Stay: Payer: Self-pay | Admitting: Sports Medicine

## 2024-02-09 DIAGNOSIS — E1165 Type 2 diabetes mellitus with hyperglycemia: Secondary | ICD-10-CM | POA: Diagnosis not present

## 2024-02-09 DIAGNOSIS — Z7189 Other specified counseling: Secondary | ICD-10-CM | POA: Diagnosis not present

## 2024-02-09 DIAGNOSIS — Z794 Long term (current) use of insulin: Secondary | ICD-10-CM | POA: Diagnosis not present

## 2024-02-09 NOTE — Progress Notes (Signed)
 Provider:  Dr. Tye Gall Location:  [facility] Place of Service:   Assisted living  PCP: Mast, Man X, NP Patient Care Team: Mast, Man X, NP as PCP - General (Internal Medicine) Verona Goodwill, MD as PCP - Electrophysiology (Cardiology) Oza Blumenthal, MD as Consulting Physician (General Surgery) Murleen Arms, MD as Consulting Physician (Hematology and Oncology) Johna Myers, MD as Consulting Physician (Radiation Oncology)  Extended Emergency Contact Information Primary Emergency Contact: Marton Sleeper Address: [address]          [place], [place] [address] [place] Home Phone: [phone] Mobile Phone: [phone] Relation: Son  Goals of Care: Advanced Directive information    02/03/2024    4:03 PM  Advanced Directives  Does Patient Have a Medical Advance Directive? Yes  Type of Advance Directive Out of facility DNR (pink MOST or yellow form)  Does patient want to make changes to medical advance directive? No - Patient declined  Copy of Healthcare Power of Attorney in Chart? No - copy requested  Pre-existing out of facility DNR order (yellow form or pink MOST form) Yellow form placed in chart (order not valid for inpatient use)      No chief complaint on file.     History of Present Illness   89 yr old F with  h/o DM, OA, HLD CHF , B12 def is evaluated for follow up to discuss goals of care Met with patient's son, nursing staff , Social worker and DON available for the meeting  Met for 1 hour from 10.30 - 11.30  Patient is having variable blood sugar readings with hypoglycemia with Bg readings in 60's and high blood sugar readings in 300's.She is currently on long acting and sliding scale insulin . Patient wants to go back to her apartment but hs difficulties to administer insulin  based on blood sugar readings.  Prior history - pt had recent hospitalization for high blood pressure readings   Past Medical History:   Diagnosis Date   Biventricular ICD (implantable cardiac defibrillator) in place    downgraded from CRT-D to CRT-P 08/30/22   Breast cancer (HCC)    Cellulitis    Change in bowel habits    CHF (congestive heart failure) (HCC)    Colon polyps    Diabetes mellitus    Type II   Diarrhea    Dyslipidemia    HOH (hard of hearing)    left   HTN (hypertension)    LBBB (left bundle branch block)    chronic   Meniere's disease    Nonischemic cardiomyopathy (HCC) 2006   Presence of permanent cardiac pacemaker    T wave oversensing 01/17/2014   Ulcerative colitis    Varicose veins    4 lazer  treatment each leg   Past Surgical History:  Procedure Laterality Date   ABDOMINAL HYSTERECTOMY     BIV PACEMAKER GENERATOR CHANGEOUT N/A 08/30/2022   Procedure: BIV PACEMAKER GENERATOR CHANGEOUT;  Surgeon: Verona Goodwill, MD;  Location: [facility] INVASIVE CV LAB;  Service: Cardiovascular;  Laterality: N/A;   BREAST LUMPECTOMY WITH RADIOACTIVE SEED LOCALIZATION Right 01/05/2023   Procedure: RIGHT BREAST BRACKETED LUMPECTOMY WITH RADIOACTIVE SEED LOCALIZATION;  Surgeon: Oza Blumenthal, MD;  Location: [facility] OR;  Service: General;  Laterality: Right;   CARDIAC CATHETERIZATION  08/2011   CAST APPLICATION Right 09/06/2023   Procedure: CAST APPLICATION;  Surgeon: Wes Hamman, MD;  Location: [facility] OR;  Service: Orthopedics;  Laterality: Right;   CHOLECYSTECTOMY  COLONOSCOPY     EP Study  08/22/08   I & D EXTREMITY Right 09/06/2023   Procedure: IRRIGATION AND DEBRIDEMENT RIGHT ANKLE;  Surgeon: Wes Hamman, MD;  Location: [facility] OR;  Service: Orthopedics;  Laterality: Right;   IMPLANTABLE CARDIOVERTER DEFIBRILLATOR GENERATOR CHANGE N/A 08/22/2013   Procedure: IMPLANTABLE CARDIOVERTER DEFIBRILLATOR GENERATOR CHANGE;  Surgeon: Verona Goodwill, MD;  Location: [facility] CATH LAB;  Service: Cardiovascular;  Laterality: N/A;   KNEE ARTHROSCOPY Left 04/14/2016   Procedure: LEFT KNEE ARTHROSCOPY, PARTIAL MEDIAL AND PARTIAL LATERAL  MENISCECTOMY, MEDIAL PLICA;  Surgeon: Neil Balls, MD;  Location: [facility] OR;  Service: Orthopedics;  Laterality: Left;   mastoid sugery     in the setting of Meniere's disease   ORIF ANKLE FRACTURE Right 09/09/2023   Procedure: OPEN REDUCTION INTERNAL FIXATION (ORIF) ANKLE FRACTURE;  Surgeon: Timothy Ford, MD;  Location: [facility] OR;  Service: Orthopedics;  Laterality: Right;   TONSILLECTOMY AND ADENOIDECTOMY      reports that she has quit smoking. Her smoking use included cigarettes. She has never been exposed to tobacco smoke. She has never used smokeless tobacco. She reports that she does not drink alcohol and does not use drugs. 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    Social Drivers of Corporate investment banker Strain: Low Risk  (88/05/2023)   Overall Financial Resource Strain (CARDIA)    Difficulty of Paying Living Expenses: Not hard at all  Food Insecurity: No Food Insecurity (09/13/2023)   Hunger Vital Sign    Worried About Running Out of Food in the Last Year: Never true    Ran Out of Food in the Last Year: Never true  Transportation Needs: No Transportation Needs (09/13/2023)   PRAPARE - Administrator, Civil Service (Medical): No    Lack of Transportation (Non-Medical): No  Physical Activity:  Not on file  Stress: Not on file  Social Connections: Not on file  Intimate Partner Violence: Not At Risk (09/13/2023)   Humiliation, Afraid, Rape, and Kick questionnaire    Fear of Current or Ex-Partner: No    Emotionally Abused: No    Physically Abused: No    Sexually Abused: No    Functional Status Survey:    Family History  Problem Relation Age of Onset   Breast cancer Mother 71       met to lungs   Emphysema Father 29   Heart disease Father    Healthy Child    Lung cancer Cousin        maternal first cousin, she smoked   Colon cancer Neg Hx    Rectal cancer Neg Hx    Stomach  cancer Neg Hx    Liver cancer Neg Hx    Esophageal cancer Neg Hx     Health Maintenance  Topic Date Due   FOOT EXAM  Never done   OPHTHALMOLOGY EXAM  Never done   Zoster Vaccines- Shingrix (2 of 2) 05/02/2023   HEMOGLOBIN A1C  08/27/2023   COVID-19 Vaccine (7 - 2024-25 season) 10/12/2023   Medicare Annual Wellness (AWV)  03/01/2024   INFLUENZA VACCINE  06/01/2024   DTaP/Tdap/Td (3 - Td or Tdap) 09/05/2033   Pneumonia Vaccine 43+ Years old  Completed   DEXA SCAN  Completed   HPV VACCINES  Aged Out   Meningococcal B Vaccine  Aged Out    Allergies  Allergen Reactions   Coreg [Carvedilol] Other (See Comments)    Unknown reaction   Firvanq [Vancomycin] Itching and Other (See Comments)    Red Man Syndrome   Lactose Intolerance (Gi)    Lanoxin [Digoxin] Other (See Comments)    DIG toxicity   Sudafed [Pseudoephedrine] Other (See Comments)    Makes skin feel like its crawling     Outpatient Encounter Medications as of 02/09/2024  Medication Sig   acetaminophen  (TYLENOL ) 500 MG tablet Take 500 mg by mouth every 8 (eight) hours as needed for headache.   aspirin  EC 81 MG tablet Take 81 mg by mouth daily.   aspirin  EC 81 MG tablet Take 1 tablet (81 mg total) by mouth daily. Swallow whole.   atorvastatin  (LIPITOR) 40 MG tablet TAKE 1 TABLET(40 MG) BY MOUTH DAILY   diclofenac  Sodium  (VOLTAREN  ARTHRITIS PAIN) 1 % GEL Apply 2 g topically 4 (four) times daily. Apply to R ankle topically four times a day for Reddened areas APPLY AWAY FROM OPENED AREAS   furosemide  (LASIX ) 20 MG tablet TAKE 1 TABLET BY MOUTH DAILY AS DIRECTED, ALTERNATING 40MG  EVERY OTHER DAY   furosemide  (LASIX ) 40 MG tablet Take 20 mg by mouth daily.   INSULIN  ASPART IJ Inject 10 Units into the skin 3 (three) times daily before meals. Inject as per sliding scale: if 0 - 400 = 0 if below 400 do not give insulin ; 401 - 500 = 10 Give 10 units for CBG 401-500. If CBG is above 500 notify MD/NP., subcutaneously before meals and at bedtime   insulin  glargine, 2 Unit Dial, (TOUJEO  MAX SOLOSTAR) 300 UNIT/ML Solostar Pen Inject 35 Units into the skin daily.   metoprolol  succinate (TOPROL -XL) 100 MG 24 hr tablet TAKE 1 TABLET(100 MG) BY MOUTH TWICE DAILY   olmesartan  (BENICAR ) 40 MG tablet TAKE 1 TABLET(40 MG) BY MOUTH DAILY   ondansetron  (ZOFRAN ) 4 MG tablet Take 4 mg by mouth every 8 (eight) hours as needed.   polyethylene glycol (MIRALAX  / GLYCOLAX ) 17 g packet Take 17 g by mouth every 12 (twelve) hours as needed for moderate constipation.   potassium chloride  (KLOR-CON  M) 10 MEQ tablet TAKE 1 TABLET(10 MEQ) BY MOUTH TWICE DAILY   potassium chloride  (KLOR-CON ) 10 MEQ tablet 10 mEq 2 (two) times daily.   risedronate (ACTONEL) 35 MG tablet Take 35 mg by mouth every Sunday. Give 1 tablet by mouth one time a day every Sun for post menopause   No facility-administered encounter medications on file as of 02/09/2024.    Review of Systems  Constitutional:  Negative for fever.  Respiratory:  Negative for cough and shortness of breath.   Cardiovascular:  Negative for chest pain and leg swelling.  Gastrointestinal:  Negative for abdominal distention, abdominal pain, blood  in stool and constipation.  Genitourinary:  Negative for dysuria.  Psychiatric/Behavioral:  Negative for confusion.    Negative unless indicated in  HPI.  There were no vitals filed for this visit. There is no height or weight on file to calculate BMI. BP Readings from Last 3 Encounters:  02/03/24 (!) 149/84  01/23/24 (!) 152/73  01/20/24 (!) 191/81   Wt Readings from Last 3 Encounters:  02/03/24 122 lb 12.8 oz (55.7 kg)  01/23/24 130 lb 3.2 oz (59.1 kg)  12/23/23 131 lb (59.4 kg)   Physical Exam Constitutional:      Appearance: Normal appearance.  HENT:     Head: Normocephalic and atraumatic.  Cardiovascular:     Rate and Rhythm: Normal rate and regular rhythm.  Pulmonary:     Effort: Pulmonary effort is normal. No respiratory distress.     Breath sounds: Normal breath sounds. No wheezing.  Abdominal:     General: Bowel sounds are normal. There is no distension.     Tenderness: There is no abdominal tenderness. There is no guarding or rebound.     Comments:    Musculoskeletal:        General: No swelling or tenderness.  Neurological:     Mental Status: She is alert. Mental status is at baseline.     Labs reviewed: Basic Metabolic Panel: Recent Labs    09/08/23 0615 09/09/23 0559 09/10/23 0530 09/27/23 0000 10/07/23 0000 01/20/24 1346  NA 140 141 138 137 142 137  K 3.3* 4.3 3.8 4.1 4.2 3.7  CL 106 108 102 99 105 99  CO2 24 25 25  28* 28* 26  GLUCOSE 199* 143* 181*  --   --  283*  BUN 25* 23 22 21 17 22   CREATININE 0.93 0.94 0.92 1.0 0.9 0.85  CALCIUM  8.5* 8.7* 8.3* 8.6* 8.5* 9.0  MG 2.0 2.1 2.2  --   --   --    Liver Function Tests: Recent Labs    02/25/23 0000 09/27/23 0000 09/28/23 0000 10/07/23 0000  AST 16  --  15 20  ALT 10  --  16 16  ALKPHOS 62  --  147* 103  ALBUMIN 3.8 3.3*  --  3.3*   No results for input(s): "LIPASE", "AMYLASE" in the last 8760 hours. No results for input(s): "AMMONIA" in the last 8760 hours. CBC: Recent Labs    09/09/23 0559 09/10/23 0530 09/28/23 0000 10/07/23 0000 01/20/24 1346  WBC 11.9* 8.1 4.9 4.6 6.8  NEUTROABS  --   --  3,244.00 2,838.00 5.6  HGB  11.6* 10.5* 11.0* 11.6* 14.0  HCT 34.9* 31.9* 34* 35* 43.7  MCV 97.5 95.8  --   --  97.5  PLT 136* 119* 138* 145* 157   Cardiac Enzymes: No results for input(s): "CKTOTAL", "CKMB", "CKMBINDEX", "TROPONINI" in the last 8760 hours. BNP: Invalid input(s): "POCBNP" Lab Results  Component Value Date   HGBA1C 7.5 02/25/2023   Lab Results  Component Value Date   TSH 1.93 02/25/2023   Lab Results  Component Value Date   VITAMINB12 414 08/22/2023   Lab Results  Component Value Date   FOLATE 19.7 03/02/2022   No results found for: "IRON", "TIBC", "FERRITIN"  Imaging and Procedures obtained prior to SNF admission: CT Head Wo Contrast Result Date: 01/20/2024 CLINICAL DATA:  Headache, hypertension. EXAM: CT HEAD WITHOUT CONTRAST TECHNIQUE: Contiguous axial images were obtained from the base of the skull through the vertex without intravenous contrast. RADIATION DOSE REDUCTION: This exam  was performed according to the departmental dose-optimization program which includes automated exposure control, adjustment of the mA and/or kV according to patient size and/or use of iterative reconstruction technique. COMPARISON:  CT head 09/29/2022. FINDINGS: Brain: No acute intracranial hemorrhage. No CT evidence of acute infarct. Nonspecific hypoattenuation in the periventricular and subcortical white matter favored to reflect chronic microvascular ischemic changes. No edema, mass effect, or midline shift. The basilar cisterns are patent. Ventricles: Prominence of the ventricles suggesting underlying parenchymal volume loss. Vascular: Atherosclerotic calcifications of the carotid siphons. No hyperdense vessel. Skull: No acute or aggressive finding. Orbits: Orbits are symmetric. Sinuses: The visualized paranasal sinuses are clear. Other: Postsurgical changes of left canal wall up mastoidectomy. Mastoid air cells are clear. IMPRESSION: No CT evidence of acute intracranial abnormality. Similar chronic microvascular  ischemic changes and parenchymal volume loss. Electronically Signed   By: Denny Flack M.D.   On: 01/20/2024 11:55    Assessment and Plan Assessment & Plan  1. Goals of care, counseling/discussion (Primary) 2. Type 2 diabetes mellitus with hyperglycemia, with long-term current use of insulin  (HCC)  Met with  patient's son, patient and had goals pf care discussion  It was determined that pt has difficulties to administer insulin  based on blood sugars and her Bg have been variable with hypoglycemia and hyperglycemia. Patient agreed to move to Assisted living   60 min Total time spent for  goals of care discussion with patient's family  regarding placement in Assisted living,  documenting clinical information in the electronic or other health record, independently interpreting results ,care coordination (not separately reported)

## 2024-02-10 ENCOUNTER — Non-Acute Institutional Stay: Payer: Self-pay | Admitting: Nurse Practitioner

## 2024-02-10 ENCOUNTER — Encounter: Payer: Self-pay | Admitting: Nurse Practitioner

## 2024-02-10 DIAGNOSIS — E1122 Type 2 diabetes mellitus with diabetic chronic kidney disease: Secondary | ICD-10-CM

## 2024-02-10 DIAGNOSIS — M1990 Unspecified osteoarthritis, unspecified site: Secondary | ICD-10-CM

## 2024-02-10 DIAGNOSIS — I5022 Chronic systolic (congestive) heart failure: Secondary | ICD-10-CM | POA: Diagnosis not present

## 2024-02-10 DIAGNOSIS — N183 Chronic kidney disease, stage 3 unspecified: Secondary | ICD-10-CM

## 2024-02-10 DIAGNOSIS — Z794 Long term (current) use of insulin: Secondary | ICD-10-CM

## 2024-02-10 DIAGNOSIS — R2689 Other abnormalities of gait and mobility: Secondary | ICD-10-CM | POA: Diagnosis not present

## 2024-02-10 DIAGNOSIS — I10 Essential (primary) hypertension: Secondary | ICD-10-CM

## 2024-02-10 NOTE — Assessment & Plan Note (Signed)
 More swelling in legs, increase Furosemide 40mg  every day, Kcl bid, f/u BMP one week

## 2024-02-10 NOTE — Progress Notes (Signed)
 Location:   [facility] Room Number: 823 Place of Service:  ALF (13) Provider: Arna Snipe Jaynell Castagnola NP  Mckyle Solanki X, NP  Patient Care Team: Cherrish Vitali X, NP as PCP - General (Internal Medicine) Duke Salvia, MD as PCP - Electrophysiology (Cardiology) Abigail Miyamoto, MD as Consulting Physician (General Surgery) Rachel Moulds, MD as Consulting Physician (Hematology and Oncology) Dorothy Puffer, MD as Consulting Physician (Radiation Oncology)  Extended Emergency Contact Information Primary Emergency Contact: [name] Address: [address]          [place], [place] [address] D[place] of [place] Home Phone: [phone] Mobile Phone: [phone] Relation: Son  Code Status: DNR Goals of care: Advanced Directive information    02/03/2024    4:03 PM  Advanced Directives  Does Patient Have a Medical Advance Directive? Yes  Type of Advance Directive Out of facility DNR (pink MOST or yellow form)  Does patient want to make changes to medical advance directive? No - Patient declined  Copy of Healthcare Power of Attorney in Chart? No - copy requested  Pre-existing out of facility DNR order (yellow form or pink MOST form) Yellow form placed in chart (order not valid for inpatient use)     Chief Complaint  Patient presents with   Acute Visit    Pain in the right lower leg(ankle and above the ankle)    HPI:  Pt is a 89 y.o. female seen today for an acute visit for c/o right ankle and lower pain when walking, no s/s of injury or infection seen.   R ankle fx, healed, last f/u Ortho 11/01/23 WBAT, mild swelling RLE              Healed multiple fractures sustained from fall 02/18/23: pelvic fractures: nondisplaced fxs of R superior and inferior pubic rami, minimally displaced fx of the inferior R sacral ala extended to the R SIJ. Mild compression fx of superior endplates of T12, L2. Prn Tylenol  Fall, mechanical T2DM/renal complication, on Toujeo to 30u every day,   Insulin Aspart Hgb A1c 7.5 02/25/23<<9.3 10/06/23 Mild bilateral hydronephrosis, no stones Sigmoid diverticulitis/IBS taking Bentyl, Imodium Aortic atherosclerosis, taking ASA, Atorvastatin HLD, taking Atorvastatin, LDL 58 02/24/23 Vit B12 deficiency, takes Vit B12, Hgb 14.0 01/20/24 Vit D deficiency, on Vit D, Vit D 41 02/24/23 CHF, taking Furosemide, Bun/creat 22/0.85 01/20/24 HTN, taking Metoprolol, Olmesartan, Hydralazine OP 03/03/23 DEXA t score -1.236, on Ca, Vit D, on Fosamax 70mg  qwk              S/p R breat cancer lumpectomy, f/u surgeon, 03/15/23 cancer center radiation tx             Radiation dermatitis right upper chest/breast, healed.    Past Medical History:  Diagnosis Date   Biventricular ICD (implantable cardiac defibrillator) in place    downgraded from CRT-D to CRT-P 08/30/22   Breast cancer (HCC)    Cellulitis    Change in bowel habits    CHF (congestive heart failure) (HCC)    Colon polyps    Diabetes mellitus    Type II   Diarrhea    Dyslipidemia    HOH (hard of hearing)    left   HTN (hypertension)    LBBB (left bundle branch block)    chronic   Meniere's disease    Nonischemic cardiomyopathy (HCC) 2006   Presence of permanent cardiac pacemaker    T wave oversensing 01/17/2014   Ulcerative colitis    Varicose  veins    4 lazer  treatment each leg   Past Surgical History:  Procedure Laterality Date   ABDOMINAL HYSTERECTOMY     BIV PACEMAKER GENERATOR CHANGEOUT N/A 08/30/2022   Procedure: BIV PACEMAKER GENERATOR CHANGEOUT;  Surgeon: Duke Salvia, MD;  Location: [facility] INVASIVE CV LAB;  Service: Cardiovascular;  Laterality: N/A;   BREAST LUMPECTOMY WITH RADIOACTIVE SEED LOCALIZATION Right 01/05/2023   Procedure: RIGHT BREAST BRACKETED LUMPECTOMY WITH RADIOACTIVE SEED LOCALIZATION;  Surgeon: Abigail Miyamoto, MD;  Location: [facility] OR;  Service: General;  Laterality: Right;   CARDIAC CATHETERIZATION  08/2011   CAST APPLICATION Right 09/06/2023   Procedure: CAST  APPLICATION;  Surgeon: Tarry Kos, MD;  Location: [facility] OR;  Service: Orthopedics;  Laterality: Right;   CHOLECYSTECTOMY     COLONOSCOPY     EP Study  08/22/08   I & D EXTREMITY Right 09/06/2023   Procedure: IRRIGATION AND DEBRIDEMENT RIGHT ANKLE;  Surgeon: Tarry Kos, MD;  Location: [facility] OR;  Service: Orthopedics;  Laterality: Right;   IMPLANTABLE CARDIOVERTER DEFIBRILLATOR GENERATOR CHANGE N/A 08/22/2013   Procedure: IMPLANTABLE CARDIOVERTER DEFIBRILLATOR GENERATOR CHANGE;  Surgeon: Duke Salvia, MD;  Location: [facility] CATH LAB;  Service: Cardiovascular;  Laterality: N/A;   KNEE ARTHROSCOPY Left 04/14/2016   Procedure: LEFT KNEE ARTHROSCOPY, PARTIAL MEDIAL AND PARTIAL LATERAL MENISCECTOMY, MEDIAL PLICA;  Surgeon: Jodi Geralds, MD;  Location: [facility] OR;  Service: Orthopedics;  Laterality: Left;   mastoid sugery     in the setting of Meniere's disease   ORIF ANKLE FRACTURE Right 09/09/2023   Procedure: OPEN REDUCTION INTERNAL FIXATION (ORIF) ANKLE FRACTURE;  Surgeon: [name], MD;  Location[facility] OR;  Service: Orthopedics;  Laterality: Right;   TONSILLECTOMY AND ADENOIDECTOMY      Allergies  Allergen Reactions   Coreg [Carvedilol] Other (See Comments)    Unknown reaction   Firvanq [Vancomycin] Itching and Other (See Comments)    Red Dimitra Woodstock Syndrome   Lactose Intolerance (Gi)    Lanoxin [Digoxin] Other (See Comments)    DIG toxicity   Sudafed [Pseudoephedrine] Other (See Comments)    Makes skin feel like its crawling     Allergies as of 02/10/2024       Reactions   Coreg [carvedilol] Other (See Comments)   Unknown reaction   Firvanq [vancomycin] Itching, Other (See Comments)   Red Christyna Letendre Syndrome   Lactose Intolerance (gi)    Lanoxin [digoxin] Other (See Comments)   DIG toxicity   Sudafed [pseudoephedrine] Other (See Comments)   Makes skin feel like its crawling        Medication List        Accurate as of February 10, 2024  2:58 PM. If you have any questions, ask your nurse or  doctor.          acetaminophen 500 MG tablet Commonly known as: TYLENOL Take 500 mg by mouth every 8 (eight) hours as needed for headache.   aspirin EC 81 MG tablet Take 81 mg by mouth daily.   aspirin EC 81 MG tablet Take 1 tablet (81 mg total) by mouth daily. Swallow whole.   atorvastatin 40 MG tablet Commonly known as: LIPITOR TAKE 1 TABLET(40 MG) BY MOUTH DAILY   diclofenac Sodium 1 % Gel Commonly known as: Voltaren Arthritis Pain Apply 2 g topically 4 (four) times daily. Apply to R ankle topically four times a day for Reddened areas APPLY AWAY FROM OPENED AREAS   furosemide 40 MG tablet Commonly known as: LASIX  Take 20 mg by mouth daily.   furosemide 20 MG tablet Commonly known as: LASIX TAKE 1 TABLET BY MOUTH DAILY AS DIRECTED, ALTERNATING 40MG  EVERY OTHER DAY   INSULIN ASPART IJ Inject 10 Units into the skin 3 (three) times daily before meals. Inject as per sliding scale: if 0 - 400 = 0 if below 400 do not give insulin; 401 - 500 = 10 Give 10 units for CBG 401-500. If CBG is above 500 notify MD/NP., subcutaneously before meals and at bedtime   metoprolol succinate 100 MG 24 hr tablet Commonly known as: TOPROL-XL TAKE 1 TABLET(100 MG) BY MOUTH TWICE DAILY   olmesartan 40 MG tablet Commonly known as: BENICAR TAKE 1 TABLET(40 MG) BY MOUTH DAILY   ondansetron 4 MG tablet Commonly known as: ZOFRAN Take 4 mg by mouth every 8 (eight) hours as needed.   polyethylene glycol 17 g packet Commonly known as: MIRALAX / GLYCOLAX Take 17 g by mouth every 12 (twelve) hours as needed for moderate constipation.   potassium chloride 10 MEQ tablet Commonly known as: KLOR-CON 10 mEq 2 (two) times daily.   potassium chloride 10 MEQ tablet Commonly known as: KLOR-CON M TAKE 1 TABLET(10 MEQ) BY MOUTH TWICE DAILY   risedronate 35 MG tablet Commonly known as: ACTONEL Take 35 mg by mouth every Sunday. Give 1 tablet by mouth one time a day every Sun for post menopause    Toujeo Max SoloStar 300 UNIT/ML Solostar Pen Generic drug: insulin glargine (2 Unit Dial) Inject 35 Units into the skin daily.        Review of Systems  Constitutional:  Negative for appetite change, fatigue and fever.  HENT:  Positive for hearing loss. Negative for congestion and trouble swallowing.   Eyes:  Negative for visual disturbance.  Respiratory:  Negative for cough, chest tightness and wheezing.        Residual hacking cough   Cardiovascular:  Positive for leg swelling.  Gastrointestinal:  Negative for abdominal pain and constipation.  Genitourinary:  Negative for dysuria and urgency.  Musculoskeletal:  Positive for arthralgias, back pain and gait problem.       Right lower leg pain   Skin:  Negative for color change.  Neurological:  Negative for tremors and headaches.  Psychiatric/Behavioral:  Negative for behavioral problems and sleep disturbance. The patient is not nervous/anxious.     Immunization History  Administered Date(s) Administered   Influenza, High Dose Seasonal PF 10/06/2016, 10/05/2017, 09/14/2018, 08/17/2022, 08/31/2023   Influenza, Quadrivalent, Recombinant, Inj, Pf 08/01/2020   Influenza,inj,Quad PF,6+ Mos 10/02/2014   Moderna Covid-19 Vaccine Bivalent Booster 45yrs & up 08/17/2023   PNEUMOCOCCAL CONJUGATE-20 03/16/2023   PPD Test 02/22/2023   Pneumococcal-Unspecified 03/29/2022   Tdap 09/29/2022, 09/06/2023   Unspecified SARS-COV-2 Vaccination 11/05/2019, 12/03/2019, 03/31/2021, 07/21/2021, 03/19/2022   Zoster Recombinant(Shingrix) 03/07/2023   Pertinent  Health Maintenance Due  Topic Date Due   FOOT EXAM  Never done   OPHTHALMOLOGY EXAM  Never done   HEMOGLOBIN A1C  08/27/2023   INFLUENZA VACCINE  06/01/2024   DEXA SCAN  Completed      05/19/2023    2:37 PM 06/02/2023    2:49 PM 06/09/2023    8:48 AM 07/08/2023   10:35 AM 07/29/2023    9:55 AM  Fall Risk  Falls in the past year? 1 1 1 1  0  Was there an injury with Fall? 1 1 1 1  0  Fall  Risk Category Calculator 3 3 3 3  0  Patient at Risk for Falls Due to  Impaired balance/gait;History of fall(s) History of fall(s);Impaired balance/gait History of fall(s);Impaired balance/gait;Impaired mobility History of fall(s);Impaired balance/gait;Impaired mobility  Fall risk Follow up Falls evaluation completed Falls evaluation completed Falls evaluation completed Falls evaluation completed Falls evaluation completed   Functional Status Survey:    Vitals:   02/10/24 1405  BP: (!) 149/84  Pulse: 81  Resp: 19  Temp: 97.8 F (36.6 C)  SpO2: 98%  Weight: 126 lb (57.2 kg)   Body mass index is 20.97 kg/m. Physical Exam Vitals and nursing note reviewed.  Constitutional:      Appearance: Normal appearance.  HENT:     Head: Normocephalic and atraumatic.     Nose: Nose normal.     Mouth/Throat:     Mouth: Mucous membranes are moist.  Eyes:     Extraocular Movements: Extraocular movements intact.     Conjunctiva/sclera: Conjunctivae normal.     Pupils: Pupils are equal, round, and reactive to light.  Cardiovascular:     Rate and Rhythm: Normal rate and regular rhythm.     Heart sounds: No murmur heard.    Comments: Pacemaker left upper chest(defibrillator was removed per patient's request in the past) No dorsalis pedis pulses or posterior tibial pulses felt.  Pulmonary:     Effort: Pulmonary effort is normal.     Breath sounds: No rales.  Abdominal:     General: Bowel sounds are normal.     Palpations: Abdomen is soft.     Tenderness: There is no abdominal tenderness.  Musculoskeletal:     Cervical back: Normal range of motion and neck supple.     Right lower leg: Edema present.     Left lower leg: Edema present.     Comments: Lower back, pelvic/thigh pain with movement/weight bearing.  Edema 2-3+BLE R>L Pain in the lower right leg above the right ankle pain when walking, no redness, warmth, open wound, or deformity.   Skin:    General: Skin is warm and dry.      Findings: Erythema present.     Comments: S/p R breat cancer lumpectomy, no apparent edema R arm.   Radiation dermatitis right upper chest/breast, healed.  R lower leg mild erythema, warmth, better than prior, slow healing surgical wound w/o s/s of infection.    Neurological:     General: No focal deficit present.     Mental Status: She is alert and oriented to person, place, and time. Mental status is at baseline.     Gait: Gait abnormal.     Comments: Ambulates with walker.   Psychiatric:        Mood and Affect: Mood normal.        Behavior: Behavior normal.        Thought Content: Thought content normal.     Labs reviewed: Recent Labs    09/08/23 0615 09/09/23 0559 09/10/23 0530 09/27/23 0000 10/07/23 0000 01/20/24 1346  NA 140 141 138 137 142 137  K 3.3* 4.3 3.8 4.1 4.2 3.7  CL 106 108 102 99 105 99  CO2 24 25 25  28* 28* 26  GLUCOSE 199* 143* 181*  --   --  283*  BUN 25* 23 22 21 17 22   CREATININE 0.93 0.94 0.92 1.0 0.9 0.85  CALCIUM 8.5* 8.7* 8.3* 8.6* 8.5* 9.0  MG 2.0 2.1 2.2  --   --   --    Recent Labs    02/25/23 0000 09/27/23 0000 09/28/23  0000 10/07/23 0000  AST 16  --  15 20  ALT 10  --  16 16  ALKPHOS 62  --  147* 103  ALBUMIN 3.8 3.3*  --  3.3*   Recent Labs    09/09/23 0559 09/10/23 0530 09/28/23 0000 10/07/23 0000 01/20/24 1346  WBC 11.9* 8.1 4.9 4.6 6.8  NEUTROABS  --   --  3,244.00 2,838.00 5.6  HGB 11.6* 10.5* 11.0* 11.6* 14.0  HCT 34.9* 31.9* 34* 35* 43.7  MCV 97.5 95.8  --   --  97.5  PLT 136* 119* 138* 145* 157   Lab Results  Component Value Date   TSH 1.93 02/25/2023   Lab Results  Component Value Date   HGBA1C 7.5 02/25/2023   Lab Results  Component Value Date   CHOL 116 02/25/2023   HDL 35 02/25/2023   LDLCALC 58 02/25/2023   TRIG 144 02/25/2023   CHOLHDL 2.7 11/10/2020    Significant Diagnostic Results in last 30 days:  CT Head Wo Contrast Result Date: 01/20/2024 CLINICAL DATA:  Headache, hypertension. EXAM:  CT HEAD WITHOUT CONTRAST TECHNIQUE: Contiguous axial images were obtained from the base of the skull through the vertex without intravenous contrast. RADIATION DOSE REDUCTION: This exam was performed according to the departmental dose-optimization program which includes automated exposure control, adjustment of the mA and/or kV according to patient size and/or use of iterative reconstruction technique. COMPARISON:  CT head 09/29/2022. FINDINGS: Brain: No acute intracranial hemorrhage. No CT evidence of acute infarct. Nonspecific hypoattenuation in the periventricular and subcortical white matter favored to reflect chronic microvascular ischemic changes. No edema, mass effect, or midline shift. The basilar cisterns are patent. Ventricles: Prominence of the ventricles suggesting underlying parenchymal volume loss. Vascular: Atherosclerotic calcifications of the carotid siphons. No hyperdense vessel. Skull: No acute or aggressive finding. Orbits: Orbits are symmetric. Sinuses: The visualized paranasal sinuses are clear. Other: Postsurgical changes of left canal wall up mastoidectomy. Mastoid air cells are clear. IMPRESSION: No CT evidence of acute intracranial abnormality. Similar chronic microvascular ischemic changes and parenchymal volume loss. Electronically Signed   By: Emily Filbert M.D.   On: 01/20/2024 11:55    Assessment/Plan: Osteoarthritis  c/o right ankle and lower pain when walking, no s/s of injury or infection seen. R ankle fx, healed, last f/u Ortho 11/01/23 WBAT Will Xray R ankle, tibia, fibula Apply Voltaren gel qid The patient declined oral analgesics.   Type 2 diabetes mellitus with renal complication (HCC) Bun/creat 22/0.85 01/20/24  on Toujeo to 30u every day,  Insulin Aspart Hgb A1c 7.5 02/25/23<<9.3 10/06/23  SYSTOLIC HEART FAILURE, CHRONIC More swelling in legs, increase Furosemide 40mg  every day, Kcl bid, f/u BMP one week   HTN (hypertension)  taking Metoprolol,  Olmesartan, Hydralazine    Family/ staff Communication: plan of care reviewed with the patient and charge nurse.   Labs/tests ordered:  xray R ankle, tibia, fibula 3 view, BMP one week.

## 2024-02-10 NOTE — Assessment & Plan Note (Signed)
 Bun/creat 22/0.85 01/20/24  on Toujeo to 30u every day,  Insulin Aspart Hgb A1c 7.5 02/25/23<<9.3 10/06/23

## 2024-02-10 NOTE — Assessment & Plan Note (Signed)
 c/o right ankle and lower pain when walking, no s/s of injury or infection seen. R ankle fx, healed, last f/u Ortho 11/01/23 WBAT Will Xray R ankle, tibia, fibula Apply Voltaren gel qid The patient declined oral analgesics.

## 2024-02-10 NOTE — Assessment & Plan Note (Signed)
 taking Metoprolol, Olmesartan, Hydralazine

## 2024-02-15 ENCOUNTER — Telehealth: Payer: Self-pay

## 2024-02-15 NOTE — Telephone Encounter (Signed)
 Didn't leave a message.. no answer

## 2024-02-16 ENCOUNTER — Inpatient Hospital Stay: Payer: Medicare Other | Attending: Hematology and Oncology | Admitting: Hematology and Oncology

## 2024-02-16 DIAGNOSIS — Z923 Personal history of irradiation: Secondary | ICD-10-CM | POA: Diagnosis not present

## 2024-02-16 DIAGNOSIS — D0511 Intraductal carcinoma in situ of right breast: Secondary | ICD-10-CM

## 2024-02-16 NOTE — Progress Notes (Signed)
 [facility] CONSULT NOTE  Patient Care Team: Mast, Man X, NP as PCP - General (Internal Medicine) Duke Salvia, MD as PCP - Electrophysiology (Cardiology) Abigail Miyamoto, MD as Consulting Physician (General Surgery) Rachel Moulds, MD as Consulting Physician (Hematology and Oncology) Dorothy Puffer, MD as Consulting Physician (Radiation Oncology)  CHIEF COMPLAINTS/PURPOSE OF CONSULTATION:  Breast cancer follow-up  HISTORY OF PRESENTING ILLNESS:  Grace Robinson 89 y.o. female is here because of recent diagnosis of right breast DCIS  I reviewed her records extensively and collaborated the history with the patient.  SUMMARY OF ONCOLOGIC HISTORY: Oncology History  Ductal carcinoma in situ (DCIS) of right breast  11/22/2022 Mammogram   1.7 * 5.8 cm* 3.4 segmental to borderline pleomorphic calcs in the right breast highly suggestive of malignancy. Stereotactic biopsy recommended.   12/01/2022 Pathology Results   DCIS, high grade,  ER 10% pos weak staining, PR neg   12/08/2022 Cancer Staging   Staging form: Breast, AJCC 8th Edition - Clinical: Stage 0 (cTis (DCIS), cN0, cM0, G2, ER+, PR-, HER2-) - Signed by Rachel Moulds, MD on 12/08/2022 Stage prefix: Initial diagnosis Histologic grading system: 3 grade system   01/05/2023 Surgery   A. BREAST, RIGHT, LUMPECTOMY:  Ductal carcinoma in situ, solid type, nuclear grade 3, with necrosis and  calcifications  DCIS greatest dimension: 5.0 cm  Margins:  negative      Closest margin:< 0.5 mm to lateral margin  Prognostic markers: ER (10%), PR (0%)  Biopsy site and clip: identified  Other findings: Flat epithelial atypia, columnar cell change, usual  ductal hyperplasia,  See oncology table    03/24/2023 - 04/21/2023 Radiation Therapy   Plan Name: Breast_R_UHRT Site: Breast, Right Technique: 3D Mode: Photon Dose Per Fraction: 5.7 Gy Prescribed Dose (Delivered / Prescribed): 28.5 Gy / 28.5 Gy Prescribed Fxs (Delivered /  Prescribed): 5 / 5    Interval History  Patient arrived to the appointment today with her son.  She is healthy at baseline except for history of congestive heart failure although she says that her cardiologist is quite pleased with the most recent workup.  She also has type 2 diabetes mellitus controlled on insulin, last hemoglobin A1c 7%. Since her last visit here she had right breast lumpectomy which showed DCIS, solid type, nuclear grade 3 with necrosis and calcifications, DCIS greatest dimension is 5 cm, ER 10% low positive, weak staining intensity, PR 0%  Interval history  Discussed the use of AI scribe software for clinical note transcription with the patient, who gave verbal consent to proceed.  History of Present Illness Grace Robinson is a 89 year old female who presents with fatigue and ear discomfort.  She experiences persistent fatigue, which she attributes to her age, stating 'I'm always tired.' Despite this, she is able to perform her chores at home and has not noticed any significant changes in her health recently. She denies any trouble with breathing and states that her bowel movements are regular.  She has ongoing issues with her ears, describing them as 'always bothering me' and having difficulty hearing. No specific treatments or interventions have been mentioned for this issue.  Her left leg is more swollen than the right and is 'still leaking.' She does not recall any specific injury to the leg.  She had a mammogram approximately two weeks ago at [facility] which reportedly showed no concerning findings. She has not seen any other doctors recently.  She is currently taking vitamin D, vitamin E, and B12 supplements.  MEDICAL HISTORY:  Past Medical History:  Diagnosis Date   Biventricular ICD (implantable cardiac defibrillator) in place    downgraded from CRT-D to CRT-P 08/30/22   Breast cancer (HCC)    Cellulitis    Change in bowel habits    CHF (congestive heart  failure) (HCC)    Colon polyps    Diabetes mellitus    Type II   Diarrhea    Dyslipidemia    HOH (hard of hearing)    left   HTN (hypertension)    LBBB (left bundle branch block)    chronic   Meniere's disease    Nonischemic cardiomyopathy (HCC) 2006   Presence of permanent cardiac pacemaker    T wave oversensing 01/17/2014   Ulcerative colitis    Varicose veins    4 lazer  treatment each leg    SURGICAL HISTORY: Past Surgical History:  Procedure Laterality Date   ABDOMINAL HYSTERECTOMY     BIV PACEMAKER GENERATOR CHANGEOUT N/A 08/30/2022   Procedure: BIV PACEMAKER GENERATOR CHANGEOUT;  Surgeon: Verona Goodwill, MD;  Location: [facility] INVASIVE CV LAB;  Service: Cardiovascular;  Laterality: N/A;   BREAST LUMPECTOMY WITH RADIOACTIVE SEED LOCALIZATION Right 01/05/2023   Procedure: RIGHT BREAST BRACKETED LUMPECTOMY WITH RADIOACTIVE SEED LOCALIZATION;  Surgeon: Oza Blumenthal, MD;  Location: [facility] OR;  Service: General;  Laterality: Right;   CARDIAC CATHETERIZATION  08/2011   CAST APPLICATION Right 09/06/2023   Procedure: CAST APPLICATION;  Surgeon: Wes Hamman, MD;  Location: [facility] OR;  Service: Orthopedics;  Laterality: Right;   CHOLECYSTECTOMY     COLONOSCOPY     EP Study  08/22/08   I & D EXTREMITY Right 09/06/2023   Procedure: IRRIGATION AND DEBRIDEMENT RIGHT ANKLE;  Surgeon: Wes Hamman, MD;  Location: [facility] OR;  Service: Orthopedics;  Laterality: Right;   IMPLANTABLE CARDIOVERTER DEFIBRILLATOR GENERATOR CHANGE N/A 08/22/2013   Procedure: IMPLANTABLE CARDIOVERTER DEFIBRILLATOR GENERATOR CHANGE;  Surgeon: Verona Goodwill, MD;  Location: [facility] CATH LAB;  Service: Cardiovascular;  Laterality: N/A;   KNEE ARTHROSCOPY Left 04/14/2016   Procedure: LEFT KNEE ARTHROSCOPY, PARTIAL MEDIAL AND PARTIAL LATERAL MENISCECTOMY, MEDIAL PLICA;  Surgeon: Neil Balls, MD;  Location: [facility] OR;  Service: Orthopedics;  Laterality: Left;   mastoid sugery     in the setting of Meniere's disease   ORIF ANKLE FRACTURE  Right 09/09/2023   Procedure: OPEN REDUCTION INTERNAL FIXATION (ORIF) ANKLE FRACTURE;  Surgeon: Timothy Ford, MD;  Location: [facility] OR;  Service: Orthopedics;  Laterality: Right;   TONSILLECTOMY AND ADENOIDECTOMY      SOCIAL HISTORY: Social History   Socioeconomic History   Marital status: Single    Spouse name: Not on file   Number of children: 2   Years of education: Not on file   Highest education level: Bachelor's degree (e.g., BA, AB, BS)  Occupational History   Occupation: retired    Comment: Runner, 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    Social Drivers of Corporate investment banker  Strain: Low Risk  (12/08/2022)   Overall Financial Resource Strain (CARDIA)    Difficulty of Paying Living Expenses: Not hard at all  Food Insecurity: No Food Insecurity (09/13/2023)   Hunger Vital Sign    Worried About Running Out of Food in the Last Year: Never true    Ran Out of Food in the Last Year: Never true  Transportation Needs: No Transportation Needs (09/13/2023)   PRAPARE - Administrator, Civil Service (Medical): No    Lack of Transportation (Non-Medical): No  Physical Activity: Not on file  Stress: Not on file  Social Connections: Not on file  Intimate Partner Violence: Not At Risk (09/13/2023)   Humiliation, Afraid, Rape, and Kick questionnaire    Fear of Current or Ex-Partner: No    Emotionally Abused: No    Physically Abused: No    Sexually Abused: No    FAMILY HISTORY: Family History  Problem Relation Age of Onset   Breast cancer Mother 66       met to  lungs   Emphysema Father 45   Heart disease Father    Healthy Child    Lung cancer Cousin        maternal first cousin, she smoked   Colon cancer Neg Hx    Rectal cancer Neg Hx    Stomach cancer Neg Hx    Liver cancer Neg Hx    Esophageal cancer Neg Hx     ALLERGIES:  is allergic to coreg [carvedilol], firvanq [vancomycin], lactose intolerance (gi), lanoxin [digoxin], and sudafed [pseudoephedrine].  MEDICATIONS:  Current Outpatient Medications  Medication Sig Dispense Refill   acetaminophen (TYLENOL) 500 MG tablet Take 500 mg by mouth every 8 (eight) hours as needed for headache.     aspirin EC 81 MG tablet Take 81 mg by mouth daily.     aspirin EC 81 MG tablet Take 1 tablet (81 mg total) by mouth daily. Swallow whole. 30 tablet 0   atorvastatin (LIPITOR) 40 MG tablet TAKE 1 TABLET(40 MG) BY MOUTH DAILY 90 tablet 4   diclofenac Sodium (VOLTAREN ARTHRITIS PAIN) 1 % GEL Apply 2 g topically 4 (four) times daily. Apply to R ankle topically four times a day for Reddened areas APPLY AWAY FROM OPENED AREAS 100 g 2   furosemide (LASIX) 20 MG tablet TAKE 1 TABLET BY MOUTH DAILY AS DIRECTED, ALTERNATING 40MG  EVERY OTHER DAY 45 tablet 3   furosemide (LASIX) 40 MG tablet Take 20 mg by mouth daily.     INSULIN ASPART IJ Inject 10 Units into the skin 3 (three) times daily before meals. Inject as per sliding scale: if 0 - 400 = 0 if below 400 do not give insulin; 401 - 500 = 10 Give 10 units for CBG 401-500. If CBG is above 500 notify MD/NP., subcutaneously before meals and at bedtime     insulin glargine, 2 Unit Dial, (TOUJEO MAX SOLOSTAR) 300 UNIT/ML Solostar Pen Inject 35 Units into the skin daily. 9 mL 0   metoprolol succinate (TOPROL-XL) 100 MG 24 hr tablet TAKE 1 TABLET(100 MG) BY MOUTH TWICE DAILY 180 tablet 3   olmesartan (BENICAR) 40 MG tablet TAKE 1 TABLET(40 MG) BY MOUTH DAILY 90 tablet 3   ondansetron (ZOFRAN) 4 MG tablet Take 4 mg by mouth every 8 (eight) hours as needed.     polyethylene  glycol (MIRALAX / GLYCOLAX) 17 g packet Take 17 g by mouth every 12 (twelve) hours as needed for moderate constipation.  potassium chloride (KLOR-CON M) 10 MEQ tablet TAKE 1 TABLET(10 MEQ) BY MOUTH TWICE DAILY 180 tablet 3   potassium chloride (KLOR-CON) 10 MEQ tablet 10 mEq 2 (two) times daily.     risedronate (ACTONEL) 35 MG tablet Take 35 mg by mouth every Sunday. Give 1 tablet by mouth one time a day every Sun for post menopause     No current facility-administered medications for this visit.    REVIEW OF SYSTEMS:   Constitutional: Denies fevers, chills or abnormal night sweats Eyes: Denies blurriness of vision, double vision or watery eyes Ears, nose, mouth, throat, and face: Denies mucositis or sore throat Respiratory: Denies cough, dyspnea or wheezes Cardiovascular: Denies palpitation, chest discomfort or lower extremity swelling Gastrointestinal:  Denies nausea, heartburn or change in bowel habits Skin: Denies abnormal skin rashes Lymphatics: Denies new lymphadenopathy or easy bruising Neurological:Denies numbness, tingling or new weaknesses Behavioral/Psych: Mood is stable, no new changes  Breast: She noted some retraction of the nipple, no lumps felt. All other systems were reviewed with the patient and are negative.  PHYSICAL EXAMINATION: ECOG PERFORMANCE STATUS: 0 - Asymptomatic  Vitals:   02/16/24 1328  BP: (!) 139/57  Pulse: 90  Resp: 18  Temp: 97.8 F (36.6 C)  SpO2: 100%    Filed Weights   02/16/24 1328  Weight: 126 lb 4.8 oz (57.3 kg)     GENERAL:alert, no distress and comfortable Breasts: Bilateral breast inspected and palpated.  Right breast with the postsurgical changes, no palpable masses No regional adenopathy Left breast normal to inspection and palpation. No LE edema  LABORATORY DATA:  I have reviewed the data as listed Lab Results  Component Value Date   WBC 6.8 01/20/2024   HGB 14.0 01/20/2024   HCT 43.7 01/20/2024   MCV 97.5  01/20/2024   PLT 157 01/20/2024   Lab Results  Component Value Date   NA 137 01/20/2024   K 3.7 01/20/2024   CL 99 01/20/2024   CO2 26 01/20/2024    RADIOGRAPHIC STUDIES: I have personally reviewed the radiological reports and agreed with the findings in the report.  ASSESSMENT AND PLAN:  Ductal carcinoma in situ (DCIS) of right breast This is a very pleasant 89 year old female patient with past medical history significant for coronary artery disease, congestive heart failure, type 2 diabetes referred to oncology for evaluation and management of DCIS. She had lumpectomy, final pathology showed DCIS, underwent adjuvant radiation.  Given weak ER positivity, we have discussed about antiestrogen therapy but decision was made to proceed with surveillance alone.  She is now here for follow-up.   Assessment & Plan Breast cancer follow up. No concerns on exam. Recent mammogram normal.   - Schedule next mammogram for next year.  Fatigue Chronic fatigue likely age-related at 89 years. No significant change, will continue to monitor.  Leg swelling and leakage Chronic unilateral leg swelling and leakage without injury history. RLE swelling compared to left with chronic venous stasis changes.  Hearing difficulty Persistent hearing issues reported.  General health maintenance Taking vitamin D, E, and B12.  - Schedule follow-up in one year.     All questions were answered. The patient knows to call the clinic with any problems, questions or concerns.    Rachel Moulds, MD 02/16/24

## 2024-02-16 NOTE — Assessment & Plan Note (Addendum)
 This is a very pleasant 89 year old female patient with past medical history significant for coronary artery disease, congestive heart failure, type 2 diabetes referred to oncology for evaluation and management of DCIS. She had lumpectomy, final pathology showed DCIS, underwent adjuvant radiation.  Given weak ER positivity, we have discussed about antiestrogen therapy but decision was made to proceed with surveillance alone.  She is now here for follow-up.   Assessment & Plan Breast cancer follow up. No concerns on exam. Recent mammogram normal.   - Schedule next mammogram for next year.  Fatigue Chronic fatigue likely age-related at 89 years. No significant change, will continue to monitor.  Leg swelling and leakage Chronic unilateral leg swelling and leakage without injury history. RLE swelling compared to left with chronic venous stasis changes.  Hearing difficulty Persistent hearing issues reported.  General health maintenance Taking vitamin D, E, and B12.  - Schedule follow-up in one year.

## 2024-02-20 ENCOUNTER — Encounter: Payer: Self-pay | Admitting: Sports Medicine

## 2024-02-23 LAB — BASIC METABOLIC PANEL WITH GFR
BUN: 21 (ref 4–21)
CO2: 23 — AB (ref 13–22)
Chloride: 105 (ref 99–108)
Creatinine: 0.9 (ref 0.5–1.1)
Glucose: 121
Potassium: 4.1 meq/L (ref 3.5–5.1)
Sodium: 140 (ref 137–147)

## 2024-02-23 LAB — COMPREHENSIVE METABOLIC PANEL WITH GFR: Calcium: 9 (ref 8.7–10.7)

## 2024-02-28 DIAGNOSIS — L84 Corns and callosities: Secondary | ICD-10-CM | POA: Diagnosis not present

## 2024-02-28 DIAGNOSIS — M79672 Pain in left foot: Secondary | ICD-10-CM | POA: Diagnosis not present

## 2024-02-28 DIAGNOSIS — B351 Tinea unguium: Secondary | ICD-10-CM | POA: Diagnosis not present

## 2024-02-28 DIAGNOSIS — E1159 Type 2 diabetes mellitus with other circulatory complications: Secondary | ICD-10-CM | POA: Diagnosis not present

## 2024-02-28 DIAGNOSIS — M79671 Pain in right foot: Secondary | ICD-10-CM | POA: Diagnosis not present

## 2024-02-29 ENCOUNTER — Ambulatory Visit: Payer: Medicare Other

## 2024-02-29 LAB — CUP PACEART REMOTE DEVICE CHECK
Battery Remaining Longevity: 125 mo
Battery Voltage: 3.02 V
Brady Statistic AP VP Percent: 47.45 %
Brady Statistic AP VS Percent: 0.76 %
Brady Statistic AS VP Percent: 50.25 %
Brady Statistic AS VS Percent: 1.55 %
Brady Statistic RA Percent Paced: 48.44 %
Brady Statistic RV Percent Paced: 0.4 %
Date Time Interrogation Session: 20250430003222
Implantable Lead Connection Status: 753985
Implantable Lead Connection Status: 753985
Implantable Lead Connection Status: 753985
Implantable Lead Connection Status: 753985
Implantable Lead Implant Date: 20031007
Implantable Lead Implant Date: 20031007
Implantable Lead Implant Date: 20031007
Implantable Lead Implant Date: 20060227
Implantable Lead Location: 753858
Implantable Lead Location: 753859
Implantable Lead Location: 753860
Implantable Lead Location: 753860
Implantable Lead Model: 4193
Implantable Lead Model: 4469
Implantable Lead Model: 4470
Implantable Lead Model: 6949
Implantable Lead Serial Number: 337988
Implantable Lead Serial Number: 361236
Implantable Pulse Generator Implant Date: 20231030
Lead Channel Impedance Value: 3363 Ohm
Lead Channel Impedance Value: 3363 Ohm
Lead Channel Impedance Value: 3363 Ohm
Lead Channel Impedance Value: 361 Ohm
Lead Channel Impedance Value: 380 Ohm
Lead Channel Impedance Value: 399 Ohm
Lead Channel Impedance Value: 494 Ohm
Lead Channel Impedance Value: 532 Ohm
Lead Channel Impedance Value: 570 Ohm
Lead Channel Pacing Threshold Amplitude: 0.625 V
Lead Channel Pacing Threshold Amplitude: 1.75 V
Lead Channel Pacing Threshold Pulse Width: 0.4 ms
Lead Channel Pacing Threshold Pulse Width: 0.4 ms
Lead Channel Sensing Intrinsic Amplitude: 1.25 mV
Lead Channel Sensing Intrinsic Amplitude: 1.25 mV
Lead Channel Sensing Intrinsic Amplitude: 31.625 mV
Lead Channel Sensing Intrinsic Amplitude: 31.625 mV
Lead Channel Setting Pacing Amplitude: 1.5 V
Lead Channel Setting Pacing Amplitude: 1.5 V
Lead Channel Setting Pacing Amplitude: 3.5 V
Lead Channel Setting Pacing Pulse Width: 0.4 ms
Lead Channel Setting Pacing Pulse Width: 0.8 ms
Lead Channel Setting Sensing Sensitivity: 1.2 mV
Zone Setting Status: 755011

## 2024-03-02 ENCOUNTER — Ambulatory Visit: Attending: Cardiology

## 2024-03-02 ENCOUNTER — Non-Acute Institutional Stay: Payer: Self-pay | Admitting: Sports Medicine

## 2024-03-02 ENCOUNTER — Telehealth: Payer: Self-pay

## 2024-03-02 ENCOUNTER — Encounter: Payer: Self-pay | Admitting: Sports Medicine

## 2024-03-02 DIAGNOSIS — I5022 Chronic systolic (congestive) heart failure: Secondary | ICD-10-CM

## 2024-03-02 DIAGNOSIS — F4320 Adjustment disorder, unspecified: Secondary | ICD-10-CM | POA: Diagnosis not present

## 2024-03-02 DIAGNOSIS — E1122 Type 2 diabetes mellitus with diabetic chronic kidney disease: Secondary | ICD-10-CM | POA: Diagnosis not present

## 2024-03-02 DIAGNOSIS — Z950 Presence of cardiac pacemaker: Secondary | ICD-10-CM | POA: Diagnosis not present

## 2024-03-02 DIAGNOSIS — N183 Chronic kidney disease, stage 3 unspecified: Secondary | ICD-10-CM

## 2024-03-02 DIAGNOSIS — R21 Rash and other nonspecific skin eruption: Secondary | ICD-10-CM

## 2024-03-02 DIAGNOSIS — Z794 Long term (current) use of insulin: Secondary | ICD-10-CM

## 2024-03-02 DIAGNOSIS — D0511 Intraductal carcinoma in situ of right breast: Secondary | ICD-10-CM

## 2024-03-02 NOTE — Progress Notes (Unsigned)
 Location:  [facility] Room Number: [id number] Place of Service:  SNF (31) Provider:  Tye Gall, MD    Patient Care Team: Mast, Man X, NP as PCP - General (Internal Medicine) Verona Goodwill, MD as PCP - Electrophysiology (Cardiology) Oza Blumenthal, MD as Consulting Physician (General Surgery) Murleen Arms, MD as Consulting Physician (Hematology and Oncology) Johna Myers, MD as Consulting Physician (Radiation Oncology)  Extended Emergency Contact Information Primary Emergency Contact: Marton Sleeper Address: [address]          [place], [place] [address] [place] Home Phone: [phone] Mobile Phone: [phone] Relation: Son  Code Status:  DNR Goals of care: Advanced Directive information    03/02/2024    2:46 PM  Advanced Directives  Does Patient Have a Medical Advance Directive? Yes  Type of Advance Directive Out of facility DNR (pink MOST or yellow form)  Does patient want to make changes to medical advance directive? No - Patient declined  Copy of Healthcare Power of Attorney in Chart? No - copy requested  Pre-existing out of facility DNR order (yellow form or pink MOST form) Yellow form placed in chart (order not valid for inpatient use)     Chief Complaint  Patient presents with   Rash    Rash    HPI:  Pt is a 89 y.o. female seen today for an acute visit for    Past Medical History:  Diagnosis Date   Biventricular ICD (implantable cardiac defibrillator) in place    downgraded from CRT-D to CRT-P 08/30/22   Breast cancer (HCC)    Cellulitis    Change in bowel habits    CHF (congestive heart failure) (HCC)    Colon polyps    Diabetes mellitus    Type II   Diarrhea    Dyslipidemia    HOH (hard of hearing)    left   HTN (hypertension)    LBBB (left bundle branch block)    chronic   Meniere's disease    Nonischemic cardiomyopathy (HCC) 2006   Presence of permanent cardiac pacemaker    T  wave oversensing 01/17/2014   Ulcerative colitis    Varicose veins    4 lazer  treatment each leg   Past Surgical History:  Procedure Laterality Date   ABDOMINAL HYSTERECTOMY     BIV PACEMAKER GENERATOR CHANGEOUT N/A 08/30/2022   Procedure: BIV PACEMAKER GENERATOR CHANGEOUT;  Surgeon: Verona Goodwill, MD;  Location: [facility] INVASIVE CV LAB;  Service: Cardiovascular;  Laterality: N/A;   BREAST LUMPECTOMY WITH RADIOACTIVE SEED LOCALIZATION Right 01/05/2023   Procedure: RIGHT BREAST BRACKETED LUMPECTOMY WITH RADIOACTIVE SEED LOCALIZATION;  Surgeon: Oza Blumenthal, MD;  Location: [facility] OR;  Service: General;  Laterality: Right;   CARDIAC CATHETERIZATION  08/2011   CAST APPLICATION Right 09/06/2023   Procedure: CAST APPLICATION;  Surgeon: Wes Hamman, MD;  Location: [facility] OR;  Service: Orthopedics;  Laterality: Right;   CHOLECYSTECTOMY     COLONOSCOPY     EP Study  08/22/08   I & D EXTREMITY Right 09/06/2023   Procedure: IRRIGATION AND DEBRIDEMENT RIGHT ANKLE;  Surgeon: Wes Hamman, MD;  Location: [facility] OR;  Service: Orthopedics;  Laterality: Right;   IMPLANTABLE CARDIOVERTER DEFIBRILLATOR GENERATOR CHANGE N/A 08/22/2013   Procedure: IMPLANTABLE CARDIOVERTER DEFIBRILLATOR GENERATOR CHANGE;  Surgeon: Verona Goodwill, MD;  Location: [facility] CATH LAB;  Service: Cardiovascular;  Laterality: N/A;   KNEE ARTHROSCOPY Left 04/14/2016   Procedure: LEFT KNEE ARTHROSCOPY, PARTIAL MEDIAL  AND PARTIAL LATERAL MENISCECTOMY, MEDIAL PLICA;  Surgeon: Neil Balls, MD;  Location: [facility] OR;  Service: Orthopedics;  Laterality: Left;   mastoid sugery     in the setting of Meniere's disease   ORIF ANKLE FRACTURE Right 09/09/2023   Procedure: OPEN REDUCTION INTERNAL FIXATION (ORIF) ANKLE FRACTURE;  Surgeon: Timothy Ford, MD;  Location: [facility] OR;  Service: Orthopedics;  Laterality: Right;   TONSILLECTOMY AND ADENOIDECTOMY      Allergies  Allergen Reactions   Coreg [Carvedilol] Other (See Comments)    Unknown reaction   Firvanq  [Vancomycin] Itching and Other (See Comments)    Red Man Syndrome   Lactose Intolerance (Gi)    Lanoxin [Digoxin] Other (See Comments)    DIG toxicity   Sudafed [Pseudoephedrine] Other (See Comments)    Makes skin feel like its crawling     Outpatient Encounter Medications as of 03/02/2024  Medication Sig   acetaminophen  (TYLENOL ) 500 MG tablet Take 500 mg by mouth every 8 (eight) hours as needed for headache.   aspirin  EC 81 MG tablet Take 81 mg by mouth daily.   aspirin  EC 81 MG tablet Take 1 tablet (81 mg total) by mouth daily. Swallow whole.   atorvastatin  (LIPITOR) 40 MG tablet TAKE 1 TABLET(40 MG) BY MOUTH DAILY   diclofenac  Sodium (VOLTAREN  ARTHRITIS PAIN) 1 % GEL Apply 2 g topically 4 (four) times daily. Apply to R ankle topically four times a day for Reddened areas APPLY AWAY FROM OPENED AREAS   furosemide  (LASIX ) 20 MG tablet TAKE 1 TABLET BY MOUTH DAILY AS DIRECTED, ALTERNATING 40MG  EVERY OTHER DAY   furosemide  (LASIX ) 40 MG tablet Take 20 mg by mouth daily.   hydrALAZINE  (APRESOLINE ) 50 MG tablet Take 50 mg by mouth 2 (two) times daily.   INSULIN  ASPART IJ Inject 10 Units into the skin 3 (three) times daily before meals. Inject as per sliding scale: if 0 - 400 = 0 if below 400 do not give insulin ; 401 - 500 = 10 Give 10 units for CBG 401-500. If CBG is above 500 notify MD/NP., subcutaneously before meals and at bedtime   insulin  glargine, 2 Unit Dial, (TOUJEO  MAX SOLOSTAR) 300 UNIT/ML Solostar Pen Inject 35 Units into the skin daily.   loperamide  (IMODIUM  A-D) 2 MG tablet Take 2 mg by mouth every 6 (six) hours as needed for diarrhea or loose stools.   metoprolol  succinate (TOPROL -XL) 100 MG 24 hr tablet TAKE 1 TABLET(100 MG) BY MOUTH TWICE DAILY   olmesartan  (BENICAR ) 40 MG tablet TAKE 1 TABLET(40 MG) BY MOUTH DAILY   ondansetron  (ZOFRAN ) 4 MG tablet Take 4 mg by mouth every 8 (eight) hours as needed.   polyethylene glycol (MIRALAX  / GLYCOLAX ) 17 g packet Take 17 g by mouth every  12 (twelve) hours as needed for moderate constipation.   potassium chloride  (KLOR-CON  M) 10 MEQ tablet TAKE 1 TABLET(10 MEQ) BY MOUTH TWICE DAILY   potassium chloride  (KLOR-CON ) 10 MEQ tablet 10 mEq 2 (two) times daily.   risedronate (ACTONEL) 35 MG tablet Take 35 mg by mouth every Sunday. Give 1 tablet by mouth one time a day every Sun for post menopause   No facility-administered encounter medications on file as of 03/02/2024.    Review of Systems  Immunization History  Administered Date(s) Administered   Influenza, High Dose Seasonal PF 10/06/2016, 10/05/2017, 09/14/2018, 08/17/2022, 08/31/2023   Influenza, Quadrivalent, Recombinant, Inj, Pf 08/01/2020   Influenza,inj,Quad PF,6+ Mos 10/02/2014   Moderna Covid-19 Vaccine Bivalent  Booster 46yrs & up 08/17/2023   PNEUMOCOCCAL CONJUGATE-20 03/16/2023   PPD Test 02/22/2023   Pneumococcal-Unspecified 03/29/2022   Tdap 09/29/2022, 09/06/2023   Unspecified SARS-COV-2 Vaccination 11/05/2019, 12/03/2019, 03/31/2021, 07/21/2021, 03/19/2022   Zoster Recombinant(Shingrix) 03/07/2023   Pertinent  Health Maintenance Due  Topic Date Due   FOOT EXAM  Never done   OPHTHALMOLOGY EXAM  Never done   HEMOGLOBIN A1C  08/27/2023   INFLUENZA VACCINE  06/01/2024   DEXA SCAN  Completed      05/19/2023    2:37 PM 06/02/2023    2:49 PM 06/09/2023    8:48 AM 07/08/2023   10:35 AM 07/29/2023    9:55 AM  Fall Risk  Falls in the past year? 1 1 1 1  0  Was there an injury with Fall? 1 1 1 1  0  Fall Risk Category Calculator 3 3 3 3  0  Patient at Risk for Falls Due to  Impaired balance/gait;History of fall(s) History of fall(s);Impaired balance/gait History of fall(s);Impaired balance/gait;Impaired mobility History of fall(s);Impaired balance/gait;Impaired mobility  Fall risk Follow up Falls evaluation completed Falls evaluation completed Falls evaluation completed Falls evaluation completed Falls evaluation completed   Functional Status Survey:    Vitals:    03/02/24 1443  BP: 138/62  Pulse: 78  Resp: 20  Temp: 98 F (36.7 C)  SpO2: 98%  Weight: 127 lb (57.6 kg)  Height: 5\' 5"  (1.651 m)   Body mass index is 21.13 kg/m. Physical Exam  Labs reviewed: Recent Labs    09/08/23 0615 09/09/23 0559 09/10/23 0530 09/27/23 0000 10/07/23 0000 01/20/24 1346  NA 140 141 138 137 142 137  K 3.3* 4.3 3.8 4.1 4.2 3.7  CL 106 108 102 99 105 99  CO2 24 25 25  28* 28* 26  GLUCOSE 199* 143* 181*  --   --  283*  BUN 25* 23 22 21 17 22   CREATININE 0.93 0.94 0.92 1.0 0.9 0.85  CALCIUM  8.5* 8.7* 8.3* 8.6* 8.5* 9.0  MG 2.0 2.1 2.2  --   --   --    Recent Labs    09/27/23 0000 09/28/23 0000 10/07/23 0000  AST  --  15 20  ALT  --  16 16  ALKPHOS  --  147* 103  ALBUMIN 3.3*  --  3.3*   Recent Labs    09/09/23 0559 09/10/23 0530 09/28/23 0000 10/07/23 0000 01/20/24 1346  WBC 11.9* 8.1 4.9 4.6 6.8  NEUTROABS  --   --  3,244.00 2,838.00 5.6  HGB 11.6* 10.5* 11.0* 11.6* 14.0  HCT 34.9* 31.9* 34* 35* 43.7  MCV 97.5 95.8  --   --  97.5  PLT 136* 119* 138* 145* 157   Lab Results  Component Value Date   TSH 1.93 02/25/2023   Lab Results  Component Value Date   HGBA1C 7.5 02/25/2023   Lab Results  Component Value Date   CHOL 116 02/25/2023   HDL 35 02/25/2023   LDLCALC 58 02/25/2023   TRIG 144 02/25/2023   CHOLHDL 2.7 11/10/2020    Significant Diagnostic Results in last 30 days:  CUP PACEART REMOTE DEVICE CHECK Result Date: 02/29/2024 PPM Scheduled remote reviewed. Normal device function.  Presenting rhythm:  AS/VP RV threshold 1.75V @ 0.63ms, trend 1.5V 2 NSVT events on previous HF transmission, HR's 160-165, 6-8 beats in duration Next remote 91 days. LA, CVRS   Assessment/Plan There are no diagnoses linked to this encounter.   Family/ staff Communication: ***  Labs/tests ordered:  ***

## 2024-03-02 NOTE — Progress Notes (Signed)
 EPIC Encounter for ICM Monitoring  Patient Name: Grace Robinson is a 89 y.o. female Date: 03/02/2024 Primary Care Physican: Mast, Man X, NP Primary Cardiologist: Camnitz Electrophysiologist: Camnitz Bi-V Pacing:  97.7%     12/24/2022 Weight: 130 lbs 02/16/2024 Office Weight: 126 lbs   Clinical Status Since 22-Jan-2024 Time in AT/AF  <0.1 hr/day (<0.1%)           Attempted call to patient and unable to reach.   Transmission results reviewed.    Optivol Thoracic impedance suggesting possible fluid accumulation starting 4/21.  Also suggesting possible fluid accumulation from 4/1-4/15   Prescribed:  Furosemide  40 mg take 0.5 tablet (20 mg total) by mouth daily.   Potassium 10 mEq 1 tablet twice a day.   Labs: 01/20/2024 Creatinine 0.85, BUN 22, Potassium 3.7, Sodium 137, GFR >60 10/07/2023 Creatinine 0.9,   BUN 17, Potassium 4.2, Sodium 142  09/27/2023 Creatinine 1.0,   BUN 21, Potassium 4.1, Sodium 137  09/10/2023 Creatinine 0.92, BUN 22, Potassium 3.8, Sodium 138  A complete set of results can be found in Results Review.   Recommendations:  Unable to reach.     Follow-up plan: ICM clinic phone appointment on 03/12/2024 to recheck fluid levels.   91 day device clinic remote transmission 05/30/2024.      EP/Cardiology Office Visits:  Recall 06/02/2024 with Creighton Doffing, NP.       Copy of ICM check sent to Dr. Lawana Pray.    3 month ICM trend: 02/28/2024.    12-14 Month ICM trend:     Almyra Jain, RN 03/02/2024 10:38 AM

## 2024-03-02 NOTE — Telephone Encounter (Signed)
 Remote ICM transmission received.  Attempted call to patient regarding ICM remote transmission and no answer or voice mail option.

## 2024-03-07 ENCOUNTER — Encounter: Payer: Self-pay | Admitting: Sports Medicine

## 2024-03-12 ENCOUNTER — Ambulatory Visit: Attending: Internal Medicine

## 2024-03-12 ENCOUNTER — Telehealth: Payer: Self-pay

## 2024-03-12 DIAGNOSIS — Z950 Presence of cardiac pacemaker: Secondary | ICD-10-CM | POA: Diagnosis not present

## 2024-03-12 DIAGNOSIS — I5022 Chronic systolic (congestive) heart failure: Secondary | ICD-10-CM | POA: Diagnosis not present

## 2024-03-12 NOTE — Telephone Encounter (Signed)
 Received voice mail message from patient stating her new number is [phone].  Secure Message sent to front desk to update new phone number in Epic.

## 2024-03-13 ENCOUNTER — Other Ambulatory Visit: Payer: Self-pay | Admitting: Sports Medicine

## 2024-03-16 NOTE — Progress Notes (Signed)
 EPIC Encounter for ICM Monitoring  Patient Name: Grace Robinson is a 89 y.o. female Date: 03/16/2024 Primary Care Physican: Mast, Man X, NP Primary Cardiologist: Camnitz Electrophysiologist: Camnitz Bi-V Pacing:  98.1%     12/24/2022 Weight: 130 lbs 02/16/2024 Office Weight: 126 lbs   Since 02-Mar-2024 Time in AT/AF  <0.1 hr/day (<0.1%)           Spoke with patient and heart failure questions reviewed.  Transmission results reviewed.  Pt asymptomatic for fluid accumulation.   [facility] is in [facility] but has had several room changes.  She has had a couple of hospitalizations since last spoke but is doing better.     Optivol Thoracic impedance trending close to baseline since 5/1.   Prescribed:  Furosemide  20 mg take 1 tablet (20 mg total) by mouth daily.   Potassium 10 mEq 1 tablet twice a day.   Labs: 01/20/2024 Creatinine 0.85, BUN 22, Potassium 3.7, Sodium 137, GFR >60 10/07/2023 Creatinine 0.9,   BUN 17, Potassium 4.2, Sodium 142  09/27/2023 Creatinine 1.0,   BUN 21, Potassium 4.1, Sodium 137  09/10/2023 Creatinine 0.92, BUN 22, Potassium 3.8, Sodium 138  A complete set of results can be found in Results Review.   Recommendations:  No changes and encouraged to call if experiencing any fluid symptoms.   Follow-up plan: ICM clinic phone appointment on 04/02/2024.   91 day device clinic remote transmission 05/30/2024.      EP/Cardiology Office Visits:  Recall 06/02/2024 with Creighton Doffing, NP.       Copy of ICM check sent to Dr. Lawana Pray.    3 month ICM trend: 03/11/2024.    12-14 Month ICM trend:     Almyra Jain, RN 03/16/2024 7:34 AM

## 2024-03-20 DIAGNOSIS — M6281 Muscle weakness (generalized): Secondary | ICD-10-CM | POA: Diagnosis not present

## 2024-03-20 DIAGNOSIS — R29898 Other symptoms and signs involving the musculoskeletal system: Secondary | ICD-10-CM | POA: Diagnosis not present

## 2024-03-20 DIAGNOSIS — R2681 Unsteadiness on feet: Secondary | ICD-10-CM | POA: Diagnosis not present

## 2024-03-22 ENCOUNTER — Non-Acute Institutional Stay: Payer: Self-pay | Admitting: Sports Medicine

## 2024-03-22 ENCOUNTER — Encounter: Payer: Self-pay | Admitting: Sports Medicine

## 2024-03-22 DIAGNOSIS — E1122 Type 2 diabetes mellitus with diabetic chronic kidney disease: Secondary | ICD-10-CM

## 2024-03-22 DIAGNOSIS — E1169 Type 2 diabetes mellitus with other specified complication: Secondary | ICD-10-CM | POA: Diagnosis not present

## 2024-03-22 DIAGNOSIS — E785 Hyperlipidemia, unspecified: Secondary | ICD-10-CM

## 2024-03-22 DIAGNOSIS — R197 Diarrhea, unspecified: Secondary | ICD-10-CM | POA: Diagnosis not present

## 2024-03-22 DIAGNOSIS — N183 Chronic kidney disease, stage 3 unspecified: Secondary | ICD-10-CM

## 2024-03-22 DIAGNOSIS — Z794 Long term (current) use of insulin: Secondary | ICD-10-CM

## 2024-03-22 DIAGNOSIS — I10 Essential (primary) hypertension: Secondary | ICD-10-CM | POA: Diagnosis not present

## 2024-03-22 NOTE — Progress Notes (Signed)
 Location:   [facility]  Nursing Home Room Number: 823-A Place of Service:  ALF [phone]) Provider:  Fredy Jericho  PCP: Mast, Man X, NP  Patient Care Team: Mast, Man X, NP as PCP - General (Internal Medicine) Verona Goodwill, MD as PCP - Electrophysiology (Cardiology) Oza Blumenthal, MD as Consulting Physician (General Surgery) Murleen Arms, MD as Consulting Physician (Hematology and Oncology) Johna Myers, MD as Consulting Physician (Radiation Oncology)  Extended Emergency Contact Information Primary Emergency Contact: Marton Sleeper Address: [address]          [place], [place] [address] [place] Home Phone: [phone] Mobile Phone: [phone] Relation: Son  Code Status:  DNR Goals of care: Advanced Directive information    03/22/2024   11:23 AM  Advanced Directives  Does Patient Have a Medical Advance Directive? Yes  Type of Advance Directive Out of facility DNR (pink MOST or yellow form)  Does patient want to make changes to medical advance directive? No - Patient declined     Chief Complaint  Patient presents with   Acute Visit    Follow up on hypertension and diabetes management.     HPI:  Pt is a 89 y.o. female with past medical history of breast cancer diabetes, hyperlipidemia, CHF is seen today for an acute visit for   Pt seen and examined in her room  Seems pleasant  and comfortable  She was recently transition to assisted living from independent living as she is requiring  assistance medication management  Diarrhea Intermittent Denies abdominal pain, nausea, vomiting  Denies feeling dizzy, lightheaded   DM  Lab Results  Component Value Date   HGBA1C 7.5 02/25/2023   HGBA1C 7.5 (H) 12/31/2022   HGBA1C 7.4 (H) 11/10/2020     Past Medical History:  Diagnosis Date   Biventricular ICD (implantable cardiac defibrillator) in place    downgraded from CRT-D to CRT-P 08/30/22   Breast cancer (HCC)     Cellulitis    Change in bowel habits    CHF (congestive heart failure) (HCC)    Colon polyps    Diabetes mellitus    Type II   Diarrhea    Dyslipidemia    HOH (hard of hearing)    left   HTN (hypertension)    LBBB (left bundle branch block)    chronic   Meniere's disease    Nonischemic cardiomyopathy (HCC) 2006   Presence of permanent cardiac pacemaker    T wave oversensing 01/17/2014   Ulcerative colitis    Varicose veins    4 lazer  treatment each leg   Past Surgical History:  Procedure Laterality Date   ABDOMINAL HYSTERECTOMY     BIV PACEMAKER GENERATOR CHANGEOUT N/A 08/30/2022   Procedure: BIV PACEMAKER GENERATOR CHANGEOUT;  Surgeon: Verona Goodwill, MD;  Location: [facility] INVASIVE CV LAB;  Service: Cardiovascular;  Laterality: N/A;   BREAST LUMPECTOMY WITH RADIOACTIVE SEED LOCALIZATION Right 01/05/2023   Procedure: RIGHT BREAST BRACKETED LUMPECTOMY WITH RADIOACTIVE SEED LOCALIZATION;  Surgeon: Oza Blumenthal, MD;  Location: [facility] OR;  Service: General;  Laterality: Right;   CARDIAC CATHETERIZATION  08/2011   CAST APPLICATION Right 09/06/2023   Procedure: CAST APPLICATION;  Surgeon: Wes Hamman, MD;  Location: [facility] OR;  Service: Orthopedics;  Laterality: Right;   CHOLECYSTECTOMY     COLONOSCOPY     EP Study  08/22/08   I & D EXTREMITY Right 09/06/2023   Procedure: IRRIGATION AND DEBRIDEMENT RIGHT ANKLE;  Surgeon: Christiane Cowing,  Artie Bimler, MD;  Location: [facility] OR;  Service: Orthopedics;  Laterality: Right;   IMPLANTABLE CARDIOVERTER DEFIBRILLATOR GENERATOR CHANGE N/A 08/22/2013   Procedure: IMPLANTABLE CARDIOVERTER DEFIBRILLATOR GENERATOR CHANGE;  Surgeon: Verona Goodwill, MD;  Location: [facility] CATH LAB;  Service: Cardiovascular;  Laterality: N/A;   KNEE ARTHROSCOPY Left 04/14/2016   Procedure: LEFT KNEE ARTHROSCOPY, PARTIAL MEDIAL AND PARTIAL LATERAL MENISCECTOMY, MEDIAL PLICA;  Surgeon: Neil Balls, MD;  Location: [facility] OR;  Service: Orthopedics;  Laterality: Left;   mastoid sugery     in the setting  of Meniere's disease   ORIF ANKLE FRACTURE Right 09/09/2023   Procedure: OPEN REDUCTION INTERNAL FIXATION (ORIF) ANKLE FRACTURE;  Surgeon: Timothy Ford, MD;  Location: [facility] OR;  Service: Orthopedics;  Laterality: Right;   TONSILLECTOMY AND ADENOIDECTOMY      Allergies  Allergen Reactions   Coreg [Carvedilol] Other (See Comments)    Unknown reaction   Firvanq [Vancomycin] Itching and Other (See Comments)    Red Man Syndrome   Lactose Intolerance (Gi)    Lanoxin [Digoxin] Other (See Comments)    DIG toxicity   Sudafed [Pseudoephedrine] Other (See Comments)    Makes skin feel like its crawling     Allergies as of 03/22/2024       Reactions   Coreg [carvedilol] Other (See Comments)   Unknown reaction   Firvanq [vancomycin] Itching, Other (See Comments)   Red Man Syndrome   Lactose Intolerance (gi)    Lanoxin [digoxin] Other (See Comments)   DIG toxicity   Sudafed [pseudoephedrine] Other (See Comments)   Makes skin feel like its crawling        Medication List        Accurate as of Mar 22, 2024 11:23 AM. If you have any questions, ask your nurse or doctor.          acetaminophen  500 MG tablet Commonly known as: TYLENOL  Take 500 mg by mouth every 8 (eight) hours as needed for headache.   aspirin  EC 81 MG tablet Take 81 mg by mouth daily.   aspirin  EC 81 MG tablet Take 1 tablet (81 mg total) by mouth daily. Swallow whole.   atorvastatin  40 MG tablet Commonly known as: LIPITOR TAKE 1 TABLET(40 MG) BY MOUTH DAILY   Voltaren  Arthritis Pain 1 % Gel Generic drug: diclofenac  Sodium Apply topically every 8 (eight) hours as needed (Apply to bilateral knees).   diclofenac  Sodium 1 % Gel Commonly known as: Voltaren  Arthritis Pain Apply 2 g topically 4 (four) times daily. Apply to R ankle topically four times a day for Reddened areas APPLY AWAY FROM OPENED AREAS   furosemide  40 MG tablet Commonly known as: LASIX  Take 40 mg by mouth 2 (two) times daily.   furosemide   20 MG tablet Commonly known as: LASIX  TAKE 1 TABLET BY MOUTH DAILY AS DIRECTED, ALTERNATING 40MG  EVERY OTHER DAY   hydrALAZINE  50 MG tablet Commonly known as: APRESOLINE  Take 50 mg by mouth 2 (two) times daily.   INSULIN  ASPART IJ Inject 10 Units into the skin 3 (three) times daily before meals. Inject as per sliding scale: if 0 - 400 = 0 if below 400 do not give insulin ; 401 - 500 = 10 Give 10 units for CBG 401-500. If CBG is above 500 notify MD/NP., subcutaneously before meals and at bedtime   loperamide  2 MG tablet Commonly known as: IMODIUM  A-D Take 2 mg by mouth every 6 (six) hours as needed for diarrhea or loose stools.  metoprolol  succinate 100 MG 24 hr tablet Commonly known as: TOPROL -XL TAKE 1 TABLET(100 MG) BY MOUTH TWICE DAILY   olmesartan  40 MG tablet Commonly known as: BENICAR  TAKE 1 TABLET(40 MG) BY MOUTH DAILY   ondansetron  4 MG tablet Commonly known as: ZOFRAN  Take 4 mg by mouth every 6 (six) hours as needed.   polyethylene glycol 17 g packet Commonly known as: MIRALAX  / GLYCOLAX  Take 17 g by mouth daily as needed for moderate constipation.   potassium chloride  10 MEQ tablet Commonly known as: KLOR-CON  Take 20 mEq by mouth 2 (two) times daily.   potassium chloride  10 MEQ tablet Commonly known as: KLOR-CON  M TAKE 1 TABLET(10 MEQ) BY MOUTH TWICE DAILY   risedronate 35 MG tablet Commonly known as: ACTONEL Take 35 mg by mouth every Sunday. Give 1 tablet by mouth one time a day every Sun for post menopause   Toujeo  Max SoloStar 300 UNIT/ML Solostar Pen Generic drug: insulin  glargine (2 Unit Dial) Inject 45 Units into the skin daily.   Toujeo  Max SoloStar 300 UNIT/ML Solostar Pen Generic drug: insulin  glargine (2 Unit Dial) ADMINISTER 35 UNITS UNDER THE SKIN DAILY        Review of Systems  Constitutional:  Negative for chills and fever.  HENT:  Negative for sinus pressure and sore throat.   Respiratory:  Negative for cough, shortness of breath and  wheezing.   Cardiovascular:  Negative for chest pain, palpitations and leg swelling.  Gastrointestinal:  Positive for diarrhea. Negative for abdominal distention, abdominal pain, blood in stool, constipation, nausea and vomiting.  Genitourinary:  Negative for dysuria, frequency and urgency.  Neurological:  Negative for dizziness, weakness and numbness.  Psychiatric/Behavioral:  Negative for confusion and dysphoric mood.     Immunization History  Administered Date(s) Administered   Influenza, High Dose Seasonal PF 10/06/2016, 10/05/2017, 09/14/2018, 08/17/2022, 08/31/2023   Influenza, Quadrivalent, Recombinant, Inj, Pf 08/01/2020   Influenza,inj,Quad PF,6+ Mos 10/02/2014   Moderna Covid-19 Vaccine Bivalent Booster 36yrs & up 08/17/2023   PNEUMOCOCCAL CONJUGATE-20 03/16/2023   PPD Test 02/22/2023   Pneumococcal-Unspecified 03/29/2022   Tdap 09/29/2022, 09/06/2023   Unspecified SARS-COV-2 Vaccination 11/05/2019, 12/03/2019, 03/31/2021, 07/21/2021, 03/19/2022   Zoster Recombinant(Shingrix) 03/07/2023   Pertinent  Health Maintenance Due  Topic Date Due   FOOT EXAM  Never done   OPHTHALMOLOGY EXAM  Never done   HEMOGLOBIN A1C  08/27/2023   INFLUENZA VACCINE  06/01/2024   DEXA SCAN  Completed      05/19/2023    2:37 PM 06/02/2023    2:49 PM 06/09/2023    8:48 AM 07/08/2023   10:35 AM 07/29/2023    9:55 AM  Fall Risk  Falls in the past year? 1 1 1 1  0  Was there an injury with Fall? 1 1 1 1  0  Fall Risk Category Calculator 3 3 3 3  0  Patient at Risk for Falls Due to  Impaired balance/gait;History of fall(s) History of fall(s);Impaired balance/gait History of fall(s);Impaired balance/gait;Impaired mobility History of fall(s);Impaired balance/gait;Impaired mobility  Fall risk Follow up Falls evaluation completed Falls evaluation completed Falls evaluation completed Falls evaluation completed Falls evaluation completed   Functional Status Survey:    Vitals:   03/22/24 1114  BP: 132/60   Pulse: 74  Resp: 18  Temp: (!) 97.3 F (36.3 C)  SpO2: 99%  Weight: 127 lb (57.6 kg)  Height: 5\' 5"  (1.651 m)   Body mass index is 21.13 kg/m. Physical Exam Constitutional:      Appearance:  Normal appearance.  HENT:     Head: Normocephalic and atraumatic.  Cardiovascular:     Rate and Rhythm: Normal rate and regular rhythm.  Pulmonary:     Effort: Pulmonary effort is normal. No respiratory distress.     Breath sounds: Normal breath sounds. No wheezing.  Abdominal:     General: Bowel sounds are normal. There is no distension.     Tenderness: There is no abdominal tenderness. There is no guarding or rebound.     Comments:    Musculoskeletal:        General: No swelling.  Neurological:     Mental Status: She is alert. Mental status is at baseline.     Motor: No weakness.     Labs reviewed: Recent Labs    09/08/23 0615 09/09/23 0559 09/10/23 0530 09/27/23 0000 10/07/23 0000 01/20/24 1346 02/23/24 0000  NA 140 141 138   < > 142 137 140  K 3.3* 4.3 3.8   < > 4.2 3.7 4.1  CL 106 108 102   < > 105 99 105  CO2 24 25 25    < > 28* 26 23*  GLUCOSE 199* 143* 181*  --   --  283*  --   BUN 25* 23 22   < > 17 22 21   CREATININE 0.93 0.94 0.92   < > 0.9 0.85 0.9  CALCIUM  8.5* 8.7* 8.3*   < > 8.5* 9.0 9.0  MG 2.0 2.1 2.2  --   --   --   --    < > = values in this interval not displayed.   Recent Labs    09/27/23 0000 09/28/23 0000 10/07/23 0000  AST  --  15 20  ALT  --  16 16  ALKPHOS  --  147* 103  ALBUMIN 3.3*  --  3.3*   Recent Labs    09/09/23 0559 09/10/23 0530 09/28/23 0000 10/07/23 0000 01/20/24 1346  WBC 11.9* 8.1 4.9 4.6 6.8  NEUTROABS  --   --  3,244.00 2,838.00 5.6  HGB 11.6* 10.5* 11.0* 11.6* 14.0  HCT 34.9* 31.9* 34* 35* 43.7  MCV 97.5 95.8  --   --  97.5  PLT 136* 119* 138* 145* 157   Lab Results  Component Value Date   TSH 1.93 02/25/2023   Lab Results  Component Value Date   HGBA1C 7.5 02/25/2023   Lab Results  Component Value Date    CHOL 116 02/25/2023   HDL 35 02/25/2023   LDLCALC 58 02/25/2023   TRIG 144 02/25/2023   CHOLHDL 2.7 11/10/2020    Significant Diagnostic Results in last 30 days:  CUP PACEART REMOTE DEVICE CHECK Result Date: 02/29/2024 PPM Scheduled remote reviewed. Normal device function.  Presenting rhythm:  AS/VP RV threshold 1.75V @ 0.7ms, trend 1.5V 2 NSVT events on previous HF transmission, HR's 160-165, 6-8 beats in duration Next remote 91 days. LA, CVRS   Assessment/Plan   Diabetes mellitus Blood sugars running high Will add a NovoLog  3 units with each meal Monitor blood sugars 3 times daily Continue with a sliding scale Continue with Toujeo  We will follow-up next week  Diarrhea Patient denies abdominal pain, nausea, vomiting, dizzy, lightheadedness Continue with Imodium  as needed  Hypertension Continue with hydralazine , olmesartan  Avoid salty foods  Hyperlipidemia Continue with Lipitor

## 2024-03-23 ENCOUNTER — Encounter: Payer: Self-pay | Admitting: Sports Medicine

## 2024-03-23 DIAGNOSIS — R29898 Other symptoms and signs involving the musculoskeletal system: Secondary | ICD-10-CM | POA: Diagnosis not present

## 2024-03-23 DIAGNOSIS — M6281 Muscle weakness (generalized): Secondary | ICD-10-CM | POA: Diagnosis not present

## 2024-03-23 DIAGNOSIS — R2681 Unsteadiness on feet: Secondary | ICD-10-CM | POA: Diagnosis not present

## 2024-03-27 ENCOUNTER — Other Ambulatory Visit: Payer: Self-pay | Admitting: Nurse Practitioner

## 2024-03-27 DIAGNOSIS — E119 Type 2 diabetes mellitus without complications: Secondary | ICD-10-CM | POA: Diagnosis not present

## 2024-03-27 DIAGNOSIS — M6281 Muscle weakness (generalized): Secondary | ICD-10-CM | POA: Diagnosis not present

## 2024-03-27 DIAGNOSIS — R29898 Other symptoms and signs involving the musculoskeletal system: Secondary | ICD-10-CM | POA: Diagnosis not present

## 2024-03-27 DIAGNOSIS — R2681 Unsteadiness on feet: Secondary | ICD-10-CM | POA: Diagnosis not present

## 2024-03-27 DIAGNOSIS — M8580 Other specified disorders of bone density and structure, unspecified site: Secondary | ICD-10-CM

## 2024-04-02 ENCOUNTER — Ambulatory Visit: Attending: Cardiology

## 2024-04-02 DIAGNOSIS — Z950 Presence of cardiac pacemaker: Secondary | ICD-10-CM

## 2024-04-02 DIAGNOSIS — I5022 Chronic systolic (congestive) heart failure: Secondary | ICD-10-CM | POA: Diagnosis not present

## 2024-04-03 DIAGNOSIS — M6281 Muscle weakness (generalized): Secondary | ICD-10-CM | POA: Diagnosis not present

## 2024-04-03 DIAGNOSIS — R2681 Unsteadiness on feet: Secondary | ICD-10-CM | POA: Diagnosis not present

## 2024-04-03 NOTE — Progress Notes (Unsigned)
 EPIC Encounter for ICM Monitoring  Patient Name: Grace Robinson is a 89 y.o. female Date: 04/03/2024 Primary Care Physican: Mast, Man X, NP Primary Cardiologist: Camnitz Electrophysiologist: Camnitz Bi-V Pacing:  98.1%     12/24/2022 Weight: 130 lbs 02/16/2024 Office Weight: 126 lbs 03/22/2024 Weight: 127 lbs per facility note   Since 02-Mar-2024 Time in AT/AF  <0.1 hr/day (<0.1%)           Spoke with patient and heart failure questions reviewed.  Transmission results reviewed.  Pt reports both her feet are swollen but other wise doing okay.      Currently resides in ALF [facility] Room 823, Nurses station on [address].    Optivol Thoracic impedance suggesting possible fluid accumulation starting 5/19.   Prescribed:  Furosemide  20 mg take 1 tablet (20 mg total) by mouth every other day alternating with 2 tablets (40 mg total) every other day.  Per [facility] Nurse, Pt is taking 40 mg bid. Potassium 10 mEq 1 tablet twice a day.   Labs: 02/23/2024 Creatinine 0.9,   BUN 21, Potassium 4.1, Sodium 140 01/20/2024 Creatinine 0.85, BUN 22, Potassium 3.7, Sodium 137, GFR >60 10/07/2023 Creatinine 0.9,   BUN 17, Potassium 4.2, Sodium 142  09/27/2023 Creatinine 1.0,   BUN 21, Potassium 4.1, Sodium 137  09/10/2023 Creatinine 0.92, BUN 22, Potassium 3.8, Sodium 138  A complete set of results can be found in Results Review.   Recommendations:  Copy sent to Dr Nathaneil Bakes at [facility] for review and FYI.     Follow-up plan: ICM clinic phone appointment on 04/09/2024 to recheck fluid levels.   91 day device clinic remote transmission 05/30/2024.      EP/Cardiology Office Visits:  Recall 06/02/2024 with Creighton Doffing, NP.       Copy of ICM check sent to Dr. Lawana Pray.    3 month ICM trend: 04/02/2024.    12-14 Month ICM trend:   ***  Almyra Jain, RN 04/03/2024 8:48 AM

## 2024-04-03 NOTE — Progress Notes (Unsigned)
 Call to [facility] and spoke with patient's nurse Bartholomew Light.   Explained reason for call and she verified patient is taking Furosemide  40 mg twice a day.   Advised patient reported this morning her feet are swollen which correlates with possible fluid accumulation as suggested by the remote transmission.    Nurse said she would leave a message for the physician, Dr Tye Gall, that is taking care of patient at [facility].  I advised will also send copy of my note to Dr Nathaneil Bakes in case she would like to make any adjustments and will also send to Dr Lawana Pray , patients device physician as well.  Copy sent to patients physician Dr Nathaneil Bakes that follows her at [facility] as FYI and review.

## 2024-04-06 DIAGNOSIS — M6281 Muscle weakness (generalized): Secondary | ICD-10-CM | POA: Diagnosis not present

## 2024-04-06 DIAGNOSIS — R2681 Unsteadiness on feet: Secondary | ICD-10-CM | POA: Diagnosis not present

## 2024-04-09 ENCOUNTER — Ambulatory Visit: Attending: Cardiology

## 2024-04-09 DIAGNOSIS — Z950 Presence of cardiac pacemaker: Secondary | ICD-10-CM

## 2024-04-09 DIAGNOSIS — I5022 Chronic systolic (congestive) heart failure: Secondary | ICD-10-CM

## 2024-04-10 ENCOUNTER — Telehealth: Payer: Self-pay

## 2024-04-10 DIAGNOSIS — R2681 Unsteadiness on feet: Secondary | ICD-10-CM | POA: Diagnosis not present

## 2024-04-10 DIAGNOSIS — M6281 Muscle weakness (generalized): Secondary | ICD-10-CM | POA: Diagnosis not present

## 2024-04-10 NOTE — Telephone Encounter (Signed)
 Remote ICM transmission received.  Attempted call to patient regarding ICM remote transmission and no answer.

## 2024-04-10 NOTE — Progress Notes (Signed)
 EPIC Encounter for ICM Monitoring  Patient Name: Grace Robinson is a 89 y.o. female Date: 04/10/2024 Primary Care Physican: Mast, Man X, NP Primary Cardiologist: Camnitz Electrophysiologist: Camnitz Bi-V Pacing:  98.2%     12/24/2022 Weight: 130 lbs 02/16/2024 Office Weight: 126 lbs 03/22/2024 Weight: 127 lbs per facility note   Since 02-Apr-2024 Time in AT/AF  0.0 hr/day (0.0%)          Attempted call to patient and unable to reach.   Transmission results reviewed.    Currently resides in ALF [facility] Room 823, Nurses station on [address].     Optivol Thoracic impedance suggesting fluid levels trending back toward baseline.   Prescribed:  Furosemide  20 mg take 1 tablet (20 mg total) by mouth every other day alternating with 2 tablets (40 mg total) every other day.  Per [facility] Nurse 04/02/2024, Pt is taking 40 mg bid. Potassium 10 mEq 1 tablet twice a day.   Labs: 02/23/2024 Creatinine 0.9,   BUN 21, Potassium 4.1, Sodium 140 01/20/2024 Creatinine 0.85, BUN 22, Potassium 3.7, Sodium 137, GFR >60 10/07/2023 Creatinine 0.9,   BUN 17, Potassium 4.2, Sodium 142  09/27/2023 Creatinine 1.0,   BUN 21, Potassium 4.1, Sodium 137  09/10/2023 Creatinine 0.92, BUN 22, Potassium 3.8, Sodium 138  A complete set of results can be found in Results Review.   Recommendations:  Unable to reach.       Follow-up plan: ICM clinic phone appointment on 05/14/2024.   91 day device clinic remote transmission 05/30/2024.      EP/Cardiology Office Visits:  Recall 06/02/2024 with Creighton Doffing, NP.       Copy of ICM check sent to Dr. Lawana Pray.    3 month ICM trend: 04/09/2024.    12-14 Month ICM trend:     Almyra Jain, RN 04/10/2024 8:40 AM

## 2024-04-17 ENCOUNTER — Encounter: Payer: Self-pay | Admitting: Nurse Practitioner

## 2024-04-17 ENCOUNTER — Non-Acute Institutional Stay: Payer: Self-pay | Admitting: Nurse Practitioner

## 2024-04-17 DIAGNOSIS — I5022 Chronic systolic (congestive) heart failure: Secondary | ICD-10-CM | POA: Diagnosis not present

## 2024-04-17 DIAGNOSIS — M8580 Other specified disorders of bone density and structure, unspecified site: Secondary | ICD-10-CM

## 2024-04-17 DIAGNOSIS — R2681 Unsteadiness on feet: Secondary | ICD-10-CM | POA: Diagnosis not present

## 2024-04-17 DIAGNOSIS — Z780 Asymptomatic menopausal state: Secondary | ICD-10-CM

## 2024-04-17 DIAGNOSIS — Z794 Long term (current) use of insulin: Secondary | ICD-10-CM

## 2024-04-17 DIAGNOSIS — I10 Essential (primary) hypertension: Secondary | ICD-10-CM | POA: Diagnosis not present

## 2024-04-17 DIAGNOSIS — E1122 Type 2 diabetes mellitus with diabetic chronic kidney disease: Secondary | ICD-10-CM | POA: Diagnosis not present

## 2024-04-17 DIAGNOSIS — N183 Chronic kidney disease, stage 3 unspecified: Secondary | ICD-10-CM

## 2024-04-17 DIAGNOSIS — M6281 Muscle weakness (generalized): Secondary | ICD-10-CM | POA: Diagnosis not present

## 2024-04-17 NOTE — Progress Notes (Signed)
 Location:   [facility] Room Number: 804 Place of Service:  AL[facility] (13) Provider: Abner Hoffman Darina Hartwell NP  Elvie Maines X, NP  Patient Care Team: Odaliz Mcqueary X, NP as PCP - General (Internal Medicine) Verona Goodwill, MD as PCP - Electrophysiology (Cardiology) Oza Blumenthal, MD as Consulting Physician (General Surgery) Murleen Arms, MD as Consulting Physician (Hematology and Oncology) Johna Myers, MD as Consulting Physician (Radiation Oncology)  Extended Emergency Contact Information Primary Emergency Contact: Marton Sleeper Address: [address]          [place], [place] [address] [place] Home Phone: [phone] Mobile Phone: [phone] Relation: Son  Code Status:  DNR Goals of care: Advanced Directive information    03/22/2024   11:23 AM  Advanced Directives  Does Patient Have a Medical Advance Directive? Yes  Type of Advance Directive Out of facility DNR (pink MOST or yellow form)  Does patient want to make changes to medical advance directive? No - Patient declined     Chief Complaint  Patient presents with   Medical Management of Chronic Issues    HPI:  Pt is a 89 y.o. female seen today for medical management of chronic diseases.      R ankle fx, healed, last f/u Ortho 11/01/23 WBAT, mild swelling RLE              Healed multiple fractures sustained from fall 02/18/23: pelvic fractures: nondisplaced fxs of R superior and inferior pubic rami, minimally displaced fx of the inferior R sacral ala extended to the R SIJ. Mild compression fx of superior endplates of T12, L2. Prn Tylenol   Fall, mechanical T2DM/renal complication, on Toujeo  to 35u every day,  Insulin  Aspart 10u if CBG > 401-500, 2u with meals-hold <70, Hgb A1c 7.5 02/25/23<<9.3 10/06/23>>8.0 03/27/24 Mild bilateral hydronephrosis, no stones Sigmoid diverticulitis/IBS taking Bentyl , Imodium  Aortic atherosclerosis, taking ASA, Atorvastatin  HLD, taking Atorvastatin , LDL 58 02/24/23 Vit  B12 deficiency, takes Vit B12, Hgb 14.0 01/20/24 Vit D deficiency, on Vit D, Vit D 41 02/24/23 CHF, taking Furosemide , Bun/creat 21/0.9 02/23/24 HTN, taking Metoprolol , Olmesartan  OP 03/03/23 DEXA t score -1.236, on Ca, Vit D, on Fosamax  70mg  qwk              S/p R breat cancer lumpectomy, f/u surgeon, 03/15/23 cancer center radiation tx             Radiation dermatitis right upper chest/breast, healed.     Past Medical History:  Diagnosis Date   Biventricular ICD (implantable cardiac defibrillator) in place    downgraded from CRT-D to CRT-P 08/30/22   Breast cancer (HCC)    Cellulitis    Change in bowel habits    CHF (congestive heart failure) (HCC)    Colon polyps    Diabetes mellitus    Type II   Diarrhea    Dyslipidemia    HOH (hard of hearing)    left   HTN (hypertension)    LBBB (left bundle branch block)    chronic   Meniere's disease    Nonischemic cardiomyopathy (HCC) 2006   Presence of permanent cardiac pacemaker    T wave oversensing 01/17/2014   Ulcerative colitis    Varicose veins    4 lazer  treatment each leg   Past Surgical History:  Procedure Laterality Date   ABDOMINAL HYSTERECTOMY     BIV PACEMAKER GENERATOR CHANGEOUT N/A 08/30/2022   Procedure: BIV PACEMAKER GENERATOR CHANGEOUT;  Surgeon: Verona Goodwill, MD;  Location:  [facility];  Service: Cardiovascular;  Laterality: N/A;   BREAST LUMPECTOMY WITH RADIOACTIVE SEED LOCALIZATION Right 01/05/2023   Procedure: RIGHT BREAST BRACKETED LUMPECTOMY WITH RADIOACTIVE SEED LOCALIZATION;  Surgeon: Oza Blumenthal, MD;  Location: [facility] OR;  Service: General;  Laterality: Right;   CARDIAC CATHETERIZATION  08/2011   CAST APPLICATION Right 09/06/2023   Procedure: CAST APPLICATION;  Surgeon: Wes Hamman, MD;  Location: [facility] OR;  Service: Orthopedics;  Laterality: Right;   CHOLECYSTECTOMY     COLONOSCOPY     EP Study  08/22/08   I & D EXTREMITY Right 09/06/2023   Procedure: IRRIGATION AND DEBRIDEMENT RIGHT ANKLE;   Surgeon: Wes Hamman, MD;  Location: [facility] OR;  Service: Orthopedics;  Laterality: Right;   IMPLANTABLE CARDIOVERTER DEFIBRILLATOR GENERATOR CHANGE N/A 08/22/2013   Procedure: IMPLANTABLE CARDIOVERTER DEFIBRILLATOR GENERATOR CHANGE;  Surgeon: Verona Goodwill, MD;  Location: [facility] CATH LAB;  Service: Cardiovascular;  Laterality: N/A;   KNEE ARTHROSCOPY Left 04/14/2016   Procedure: LEFT KNEE ARTHROSCOPY, PARTIAL MEDIAL AND PARTIAL LATERAL MENISCECTOMY, MEDIAL PLICA;  Surgeon: Neil Balls, MD;  Location: [facility] OR;  Service: Orthopedics;  Laterality: Left;   mastoid sugery     in the setting of Meniere's disease   ORIF ANKLE FRACTURE Right 09/09/2023   Procedure: OPEN REDUCTION INTERNAL FIXATION (ORIF) ANKLE FRACTURE;  Surgeon: Timothy Ford, MD;  Location: [facility] OR;  Service: Orthopedics;  Laterality: Right;   TONSILLECTOMY AND ADENOIDECTOMY      Allergies  Allergen Reactions   Coreg [Carvedilol] Other (See Comments)    Unknown reaction   Firvanq [Vancomycin] Itching and Other (See Comments)    Red Jayse[place] Syndrome   Lactose Intolerance (Gi)    Lanoxin [Digoxin] Other (See Comments)    DIG toxicity   Sudafed [Pseudoephedrine] Other (See Comments)    Makes skin feel like its crawling     Allergies as of 04/17/2024       Reactions   Coreg [carvedilol] Other (See Comments)   Unknown reaction   Firvanq [vancomycin] Itching, Other (See Comments)   Red [place] Ribeiro Syndrome   Lactose Intolerance (gi)    Lanoxin [digoxin] Other (See Comments)   DIG toxicity   Sudafed [pseudoephedrine] Other (See Comments)   Makes skin feel like its crawling        Medication List        Accurate as of April 17, 2024 11:59 PM. If you have any questions, ask your nurse or doctor.          acetaminophen  500 MG tablet Commonly known as: TYLENOL  Take 500 mg by mouth every 8 (eight) hours as needed for headache.   aspirin  EC 81 MG tablet Take 81 mg by mouth daily.   aspirin  EC 81 MG tablet Take 1 tablet (81 mg  total) by mouth daily. Swallow whole.   atorvastatin  40 MG tablet Commonly known as: LIPITOR TAKE 1 TABLET(40 MG) BY MOUTH DAILY   Voltaren  Arthritis Pain 1 % Gel Generic drug: diclofenac  Sodium Apply topically every 8 (eight) hours as needed (Apply to bilateral knees).   diclofenac  Sodium 1 % Gel Commonly known as: Voltaren  Arthritis Pain Apply 2 g topically 4 (four) times daily. Apply to R ankle topically four times a day for Reddened areas APPLY AWAY FROM OPENED AREAS   furosemide  40 MG tablet Commonly known as: LASIX  Take 40 mg by mouth 2 (two) times daily.   furosemide  20 MG tablet Commonly known as: LASIX  TAKE 1 TABLET BY MOUTH DAILY  AS DIRECTED, ALTERNATING 40MG  EVERY OTHER DAY   hydrALAZINE  50 MG tablet Commonly known as: APRESOLINE  Take 50 mg by mouth 2 (two) times daily.   INSULIN  ASPART IJ Inject 10 Units into the skin 3 (three) times daily before meals. Inject as per sliding scale: if 0 - 400 = 0 if below 400 do not give insulin ; 401 - 500 = 10 Give 10 units for CBG 401-500. If CBG is above 500 notify MD/NP., subcutaneously before meals and at bedtime   loperamide  2 MG tablet Commonly known as: IMODIUM  A-D Take 2 mg by mouth every 6 (six) hours as needed for diarrhea or loose stools.   metoprolol  succinate 100 MG 24 hr tablet Commonly known as: TOPROL -XL TAKE 1 TABLET(100 MG) BY MOUTH TWICE DAILY   olmesartan  40 MG tablet Commonly known as: BENICAR  TAKE 1 TABLET(40 MG) BY MOUTH DAILY   ondansetron  4 MG tablet Commonly known as: ZOFRAN  Take 4 mg by mouth every 6 (six) hours as needed.   polyethylene glycol 17 g packet Commonly known as: MIRALAX  / GLYCOLAX  Take 17 g by mouth daily as needed for moderate constipation.   potassium chloride  10 MEQ tablet Commonly known as: KLOR-CON  Take 20 mEq by mouth 2 (two) times daily.   potassium chloride  10 MEQ tablet Commonly known as: KLOR-CON  M TAKE 1 TABLET(10 MEQ) BY MOUTH TWICE DAILY   risedronate 35 MG  tablet Commonly known as: ACTONEL Take 35 mg by mouth every Sunday. Give 1 tablet by mouth one time a day every Sun for post menopause   Toujeo  Max SoloStar 300 UNIT/ML Solostar Pen Generic drug: insulin  glargine (2 Unit Dial) Inject 45 Units into the skin daily.   Toujeo  Max SoloStar 300 UNIT/ML Solostar Pen Generic drug: insulin  glargine (2 Unit Dial) ADMINISTER 35 UNITS UNDER THE SKIN DAILY        Review of Systems  Constitutional:  Negative for appetite change, fatigue and fever.  HENT:  Positive for hearing loss. Negative for congestion and trouble swallowing.   Eyes:  Negative for visual disturbance.  Respiratory:  Negative for cough, chest tightness and wheezing.   Cardiovascular:  Positive for leg swelling.  Gastrointestinal:  Negative for abdominal pain and constipation.  Genitourinary:  Negative for dysuria and urgency.  Musculoskeletal:  Positive for arthralgias, back pain and gait problem.       Right lower leg pain occasionally Chronic R+L knee pain, ambulates with walker, s/p inj  Skin:  Negative for color change.  Neurological:  Negative for tremors and headaches.  Psychiatric/Behavioral:  Negative for behavioral problems and sleep disturbance. The patient is not nervous/anxious.     Immunization History  Administered Date(s) Administered   Influenza, High Dose Seasonal PF 10/06/2016, 10/05/2017, 09/14/2018, 08/17/2022, 08/31/2023   Influenza, Quadrivalent, Recombinant, Inj, Pf 08/01/2020   Influenza,inj,Quad PF,6+ Mos 10/02/2014   Moderna Covid-19 Vaccine Bivalent Booster 36yrs & up 08/17/2023   PNEUMOCOCCAL CONJUGATE-20 03/16/2023   PPD Test 02/22/2023   Pneumococcal-Unspecified 03/29/2022   Tdap 09/29/2022, 09/06/2023   Unspecified SARS-COV-2 Vaccination 11/05/2019, 12/03/2019, 03/31/2021, 07/21/2021, 03/19/2022   Zoster Recombinant(Shingrix) 03/07/2023   Pertinent  Health Maintenance Due  Topic Date Due   FOOT EXAM  Never done   OPHTHALMOLOGY EXAM   Never done   HEMOGLOBIN A1C  08/27/2023   INFLUENZA VACCINE  06/01/2024   DEXA SCAN  Completed      05/19/2023    2:37 PM 06/02/2023    2:49 PM 06/09/2023    8:48 AM 07/08/2023  10:35 AM 07/29/2023    9:55 AM  Fall Risk  Falls in the past year? 1 1 1 1  0  Was there an injury with Fall? 1 1 1 1  0  Fall Risk Category Calculator 3 3 3 3  0  Patient at Risk for Falls Due to  Impaired balance/gait;History of fall(s) History of fall(s);Impaired balance/gait History of fall(s);Impaired balance/gait;Impaired mobility History of fall(s);Impaired balance/gait;Impaired mobility  Fall risk Follow up Falls evaluation completed Falls evaluation completed Falls evaluation completed Falls evaluation completed Falls evaluation completed   Functional Status Survey:    Vitals:   04/17/24 1309  BP: 130/75  Pulse: 84  Resp: 18  Temp: (!) 97.3 F (36.3 C)  SpO2: 98%  Weight: 131 lb 8 oz (59.6 kg)   Body mass index is 21.88 kg/m. Physical Exam Vitals and nursing note reviewed.  Constitutional:      Appearance: Normal appearance.  HENT:     Head: Normocephalic and atraumatic.     Nose: Nose normal.     Mouth/Throat:     Mouth: Mucous membranes are moist.   Eyes:     Extraocular Movements: Extraocular movements intact.     Conjunctiva/sclera: Conjunctivae normal.     Pupils: Pupils are equal, round, and reactive to light.    Cardiovascular:     Rate and Rhythm: Normal rate and regular rhythm.     Heart sounds: No murmur heard.    Comments: Pacemaker left upper chest(defibrillator was removed per patient's request in the past) No dorsalis pedis pulses or posterior tibial pulses felt.  Pulmonary:     Effort: Pulmonary effort is normal.     Breath sounds: No rales.  Abdominal:     General: Bowel sounds are normal.     Palpations: Abdomen is soft.     Tenderness: There is no abdominal tenderness.   Musculoskeletal:     Cervical back: Normal range of motion and neck supple.     Right lower  leg: Edema present.     Left lower leg: Edema present.     Comments: Lower back, pelvic/thigh pain with movement/weight bearing.  Edema 1+BLE R>L Pain in the lower right leg above the right ankle pain when walking, no redness, warmth, open wound, or deformity.    Skin:    General: Skin is warm and dry.     Comments: S/p R breat cancer lumpectomy, no apparent edema R arm.   Radiation dermatitis right upper chest/breast, healed.     Neurological:     General: No focal deficit present.     Mental Status: She is alert and oriented to person, place, and time. Mental status is at baseline.     Gait: Gait abnormal.     Comments: Ambulates with walker.   Psychiatric:        Mood and Affect: Mood normal.        Behavior: Behavior normal.        Thought Content: Thought content normal.     Labs reviewed: Recent Labs    09/08/23 0615 09/09/23 0559 09/10/23 0530 09/27/23 0000 10/07/23 0000 01/20/24 1346 02/23/24 0000  NA 140 141 138   < > 142 137 140  K 3.3* 4.3 3.8   < > 4.2 3.7 4.1  CL 106 108 102   < > 105 99 105  CO2 24 25 25    < > 28* 26 23*  GLUCOSE 199* 143* 181*  --   --  283*  --  BUN 25* 23 22   < > 17 22 21   CREATININE 0.93 0.94 0.92   < > 0.9 0.85 0.9  CALCIUM  8.5* 8.7* 8.3*   < > 8.5* 9.0 9.0  MG 2.0 2.1 2.2  --   --   --   --    < > = values in this interval not displayed.   Recent Labs    09/27/23 0000 09/28/23 0000 10/07/23 0000  AST  --  15 20  ALT  --  16 16  ALKPHOS  --  147* 103  ALBUMIN 3.3*  --  3.3*   Recent Labs    09/09/23 0559 09/10/23 0530 09/28/23 0000 10/07/23 0000 01/20/24 1346  WBC 11.9* 8.1 4.9 4.6 6.8  NEUTROABS  --   --  3,244.00 2,838.00 5.6  HGB 11.6* 10.5* 11.0* 11.6* 14.0  HCT 34.9* 31.9* 34* 35* 43.7  MCV 97.5 95.8  --   --  97.5  PLT 136* 119* 138* 145* 157   Lab Results  Component Value Date   TSH 1.93 02/25/2023   Lab Results  Component Value Date   HGBA1C 7.5 02/25/2023   Lab Results  Component Value Date    CHOL 116 02/25/2023   HDL 35 02/25/2023   LDLCALC 58 02/25/2023   TRIG 144 02/25/2023   CHOLHDL 2.7 11/10/2020    Significant Diagnostic Results in last 30 days:  No results found.  Assessment/Plan  Type 2 diabetes mellitus with renal complication (HCC) on Toujeo  to 35u every day,  Insulin  Aspart 10u if CBG > 401-500, 2u with meals-hold <70, Hgb A1c 7.5 02/25/23<<9.3 10/06/23>>8.0 03/27/24 Noted am CBG 80s, encourage hs snacks Average hs CBG 200-300s, will check CBG hs prn since the patient c/o sore fingers from CBG check.   SYSTOLIC HEART FAILURE, CHRONIC  taking Furosemide , Bun/creat 21/0.9 02/23/24  HTN (hypertension) Blood pressure is controlled, taking Metoprolol , Olmesartan   Osteopenia after menopause 03/03/23 DEXA t score -1.236, on Ca, Vit D, on Fosamax  70mg  qwk    Family/ staff Communication: plan of care reviewed with the patient and charge nurse.   Labs/tests ordered:  none

## 2024-04-17 NOTE — Assessment & Plan Note (Signed)
Blood pressure is controlled,  taking Metoprolol, Olmesartan 

## 2024-04-17 NOTE — Assessment & Plan Note (Addendum)
 on Toujeo  to 35u every day,  Insulin  Aspart 10u if CBG > 401-500, 2u with meals-hold <70, Hgb A1c 7.5 02/25/23<<9.3 10/06/23>>8.0 03/27/24 Noted am CBG 80s, encourage hs snacks Average hs CBG 200-300s, will check CBG hs prn since the patient c/o sore fingers from CBG check.

## 2024-04-17 NOTE — Assessment & Plan Note (Signed)
 taking Furosemide , Bun/creat 21/0.9 02/23/24

## 2024-04-17 NOTE — Assessment & Plan Note (Signed)
 03/03/23 DEXA t score -1.236, on Ca, Vit D, on Fosamax  70mg  qwk

## 2024-04-18 ENCOUNTER — Encounter: Payer: Self-pay | Admitting: Nurse Practitioner

## 2024-04-18 ENCOUNTER — Non-Acute Institutional Stay: Payer: Self-pay | Admitting: Nurse Practitioner

## 2024-04-18 DIAGNOSIS — Z0000 Encounter for general adult medical examination without abnormal findings: Secondary | ICD-10-CM | POA: Diagnosis not present

## 2024-04-18 NOTE — Progress Notes (Signed)
 Subjective:   Grace Robinson is a 89 y.o. female who presents for Medicare Annual (Subsequent) preventive examination @ [facility]  Visit Complete: In person  Patient Medicare AWV questionnaire was completed by the patient on 04/18/24; I have confirmed that all information answered by patient is correct and no changes since this date.  Cardiac Risk Factors include: advanced age (>13men, >24 women);diabetes mellitus;dyslipidemia;hypertension     Objective:    Today's Vitals   04/18/24 1324 04/18/24 1330  BP: (!) 155/77   Pulse: 75   Resp: 16   Temp: (!) 97.4 F (36.3 C)   SpO2: 98%   Weight: 129 lb 6.4 oz (58.7 kg)   PainSc:  2    Body mass index is 21.53 kg/m.     03/22/2024   11:23 AM 03/02/2024    2:46 PM 02/03/2024    4:03 PM 01/20/2024   10:16 AM 11/03/2023   10:59 AM 10/21/2023    2:39 PM 10/10/2023   12:23 PM  Advanced Directives  Does Patient Have a Medical Advance Directive? Yes Yes Yes Yes Yes Yes Yes  Type of Advance Directive Out of facility DNR (pink MOST or yellow form) Out of facility DNR (pink MOST or yellow form) Out of facility DNR (pink MOST or yellow form) Healthcare Power of Attorney Out of facility DNR (pink MOST or yellow form) Out of facility DNR (pink MOST or yellow form) Out of facility DNR (pink MOST or yellow form)  Does patient want to make changes to medical advance directive? No - Patient declined No - Patient declined No - Patient declined No - Patient declined No - Patient declined No - Patient declined No - Patient declined  Copy of Healthcare Power of Attorney in Chart?  No - copy requested No - copy requested No - copy requested     Pre-existing out of facility DNR order (yellow form or pink MOST form)  Yellow form placed in chart (order not valid for inpatient use) Yellow form placed in chart (order not valid for inpatient use)  Yellow form placed in chart (order not valid for inpatient use) Yellow form placed in chart (order not valid for inpatient  use) Yellow form placed in chart (order not valid for inpatient use)    Current Medications (verified) Outpatient Encounter Medications as of 04/18/2024  Medication Sig   acetaminophen  (TYLENOL ) 500 MG tablet Take 500 mg by mouth every 8 (eight) hours as needed for headache.   aspirin  EC 81 MG tablet Take 81 mg by mouth daily.   aspirin  EC 81 MG tablet Take 1 tablet (81 mg total) by mouth daily. Swallow whole. (Patient not taking: Reported on 03/22/2024)   atorvastatin  (LIPITOR) 40 MG tablet TAKE 1 TABLET(40 MG) BY MOUTH DAILY   diclofenac  Sodium (VOLTAREN  ARTHRITIS PAIN) 1 % GEL Apply 2 g topically 4 (four) times daily. Apply to R ankle topically four times a day for Reddened areas APPLY AWAY FROM OPENED AREAS (Patient not taking: Reported on 03/22/2024)   diclofenac  Sodium (VOLTAREN  ARTHRITIS PAIN) 1 % GEL Apply topically every 8 (eight) hours as needed (Apply to bilateral knees).   furosemide  (LASIX ) 20 MG tablet TAKE 1 TABLET BY MOUTH DAILY AS DIRECTED, ALTERNATING 40MG  EVERY OTHER DAY (Patient not taking: Reported on 03/22/2024)   furosemide  (LASIX ) 40 MG tablet Take 40 mg by mouth 2 (two) times daily.   hydrALAZINE  (APRESOLINE ) 50 MG tablet Take 50 mg by mouth 2 (two) times daily.   INSULIN  ASPART  IJ Inject 10 Units into the skin 3 (three) times daily before meals. Inject as per sliding scale: if 0 - 400 = 0 if below 400 do not give insulin ; 401 - 500 = 10 Give 10 units for CBG 401-500. If CBG is above 500 notify MD/NP., subcutaneously before meals and at bedtime   insulin  glargine, 2 Unit Dial, (TOUJEO  MAX SOLOSTAR) 300 UNIT/ML Solostar Pen Inject 45 Units into the skin daily.   loperamide  (IMODIUM  A-D) 2 MG tablet Take 2 mg by mouth every 6 (six) hours as needed for diarrhea or loose stools.   metoprolol  succinate (TOPROL -XL) 100 MG 24 hr tablet TAKE 1 TABLET(100 MG) BY MOUTH TWICE DAILY   olmesartan  (BENICAR ) 40 MG tablet TAKE 1 TABLET(40 MG) BY MOUTH DAILY   ondansetron  (ZOFRAN ) 4 MG tablet  Take 4 mg by mouth every 6 (six) hours as needed.   polyethylene glycol (MIRALAX  / GLYCOLAX ) 17 g packet Take 17 g by mouth daily as needed for moderate constipation.   potassium chloride  (KLOR-CON  M) 10 MEQ tablet TAKE 1 TABLET(10 MEQ) BY MOUTH TWICE DAILY (Patient not taking: Reported on 03/22/2024)   potassium chloride  (KLOR-CON ) 10 MEQ tablet Take 20 mEq by mouth 2 (two) times daily.   risedronate (ACTONEL) 35 MG tablet Take 35 mg by mouth every Sunday. Give 1 tablet by mouth one time a day every Sun for post menopause   TOUJEO  MAX SOLOSTAR 300 UNIT/ML Solostar Pen ADMINISTER 35 UNITS UNDER THE SKIN DAILY (Patient not taking: Reported on 03/22/2024)   No facility-administered encounter medications on file as of 04/18/2024.    Allergies (verified) Coreg [carvedilol], Firvanq [vancomycin], Lactose intolerance (gi), Lanoxin [digoxin], and Sudafed [pseudoephedrine]   History: Past Medical History:  Diagnosis Date   Biventricular ICD (implantable cardiac defibrillator) in place    downgraded from CRT-D to CRT-P 08/30/22   Breast cancer (HCC)    Cellulitis    Change in bowel habits    CHF (congestive heart failure) (HCC)    Colon polyps    Diabetes mellitus    Type II   Diarrhea    Dyslipidemia    HOH (hard of hearing)    left   HTN (hypertension)    LBBB (left bundle branch block)    chronic   Meniere's disease    Nonischemic cardiomyopathy (HCC) 2006   Presence of permanent cardiac pacemaker    T wave oversensing 01/17/2014   Ulcerative colitis    Varicose veins    4 lazer  treatment each leg   Past Surgical History:  Procedure Laterality Date   ABDOMINAL HYSTERECTOMY     BIV PACEMAKER GENERATOR CHANGEOUT N/A 08/30/2022   Procedure: BIV PACEMAKER GENERATOR CHANGEOUT;  Surgeon: Fernande Elspeth BROCKS, MD;  Location: [facility] INVASIVE CV LAB;  Service: Cardiovascular;  Laterality: N/A;   BREAST LUMPECTOMY WITH RADIOACTIVE SEED LOCALIZATION Right 01/05/2023   Procedure: RIGHT BREAST  BRACKETED LUMPECTOMY WITH RADIOACTIVE SEED LOCALIZATION;  Surgeon: Vernetta Berg, MD;  Location: [facility] OR;  Service: General;  Laterality: Right;   CARDIAC CATHETERIZATION  08/2011   CAST APPLICATION Right 09/06/2023   Procedure: CAST APPLICATION;  Surgeon: Jerri Kay HERO, MD;  Location: [facility] OR;  Service: Orthopedics;  Laterality: Right;   CHOLECYSTECTOMY     COLONOSCOPY     EP Study  08/22/08   I & D EXTREMITY Right 09/06/2023   Procedure: IRRIGATION AND DEBRIDEMENT RIGHT ANKLE;  Surgeon: Jerri Kay HERO, MD;  Location: [facility] OR;  Service: Orthopedics;  Laterality: Right;  IMPLANTABLE CARDIOVERTER DEFIBRILLATOR GENERATOR CHANGE N/A 08/22/2013   Procedure: IMPLANTABLE CARDIOVERTER DEFIBRILLATOR GENERATOR CHANGE;  Surgeon: Elspeth JAYSON Sage, MD;  Location: [facility] CATH LAB;  Service: Cardiovascular;  Laterality: N/A;   KNEE ARTHROSCOPY Left 04/14/2016   Procedure: LEFT KNEE ARTHROSCOPY, PARTIAL MEDIAL AND PARTIAL LATERAL MENISCECTOMY, MEDIAL PLICA;  Surgeon: Norleen Gavel, MD;  Location: [facility] OR;  Service: Orthopedics;  Laterality: Left;   mastoid sugery     in the setting of Meniere's disease   ORIF ANKLE FRACTURE Right 09/09/2023   Procedure: OPEN REDUCTION INTERNAL FIXATION (ORIF) ANKLE FRACTURE;  Surgeon: Harden Jerona GAILS, MD;  Location: [facility] OR;  Service: Orthopedics;  Laterality: Right;   TONSILLECTOMY AND ADENOIDECTOMY     Family History  Problem Relation Age of Onset   Breast cancer Mother 55       met to lungs   Emphysema Father 75   Heart disease Father    Healthy Child    Lung cancer Cousin        maternal first cousin, she smoked   Colon cancer Neg Hx    Rectal cancer Neg Hx    Stomach cancer Neg Hx    Liver cancer Neg Hx    Esophageal cancer Neg Hx    Social History   Socioeconomic History   Marital status: Single    Spouse name: Not on file   Number of children: 2   Years of education: Not on file   Highest education level: Bachelor's degree (e.g., BA, AB, BS)  Occupational History    Occupation: retired    Comment: Runner, 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    Social Drivers of Corporate investment banker Strain: Low Risk  (12/08/2022)   Overall Financial Resource Strain (CARDIA)    Difficulty of Paying Living Expenses: Not hard at all  Food Insecurity: No Food Insecurity (09/13/2023)   Hunger Vital Sign    Worried About Running Out of Food in the Last Year: Never true    Ran Out of Food in the Last Year: Never true  Transportation Needs: No Transportation Needs (09/13/2023)   PRAPARE - Administrator, Civil Service (Medical): No    Lack of Transportation (Non-Medical): No  Physical Activity: Not on file  Stress: Not on file  Social Connections: Not on file    Tobacco Counseling Counseling given: Not Answered Tobacco comments: quit in 1970   Clinical Intake:  Pre-visit preparation completed: Yes  Pain : 0-10 Pain Score: 2  Pain Type: Chronic pain Pain Location: Knee Pain Orientation: Left, Right Pain Radiating Towards: none Pain Descriptors / Indicators: Aching Pain Onset: More than a month ago Pain Frequency: Intermittent Pain Relieving Factors: resting, Tylenol , inj Effect of Pain on Daily Activities: less walking  Pain Relieving Factors: resting, Tylenol , inj  Nutritional Status: BMI of 19-24  Normal Nutritional Risks: None Diabetes: Yes CBG done?: Yes CBG resulted in Enter/ Edit results?: Yes Did pt. bring in CBG monitor from home?: Yes Glucose  Meter Downloaded?: No  How often do you need to have someone help you when you  read instructions, pamphlets, or other written materials from your doctor or pharmacy?: 2 - Rarely What is the last grade level you completed in school?: college  Interpreter Needed?: No  Information entered by :: Nellie Chevalier Lorenda Hark NP   Activities of Daily Living    04/18/2024    1:33 PM 09/14/2023    9:00 AM  In your present state of health, do you have any difficulty performing the following activities:  Hearing? 0 0  Vision? 0 0  Difficulty concentrating or making decisions? 0 0  Walking or climbing stairs? 1   Dressing or bathing? 1   Doing errands, shopping? 1 0  Preparing Food and eating ? N   Using the Toilet? N   In the past six months, have you accidently leaked urine? Y   Do you have problems with loss of bowel control? N   Managing your Medications? Y   Managing your Finances? Y   Housekeeping or managing your Housekeeping? Y     Patient Care Team: Joshuajames Moehring X, NP as PCP - General (Internal Medicine) Fernande Elspeth BROCKS, MD as PCP - Electrophysiology (Cardiology) Vernetta Berg, MD as Consulting Physician (General Surgery) Loretha Ash, MD as Consulting Physician (Hematology and Oncology) Dewey Rush, MD as Consulting Physician (Radiation Oncology)  Indicate any recent Medical Services you may have received from other than [facility] providers in the past year (date may be approximate).     Assessment:   This is a routine wellness examination for [facility].  Hearing/Vision screen No results found.   Goals Addressed             This Visit's Progress    Maintain Mobility and Function       Evidence-based guidance:  Acknowledge and validate impact of pain, loss of strength and potential disfigurement (hand osteoarthritis) on mental health and daily life, such as social isolation, anxiety, depression, impaired sexual relationship and   injury from falls.  Anticipate referral to physical or occupational therapy for assessment, therapeutic exercise and recommendation  for adaptive equipment or assistive devices; encourage participation.  Assess impact on ability to perform activities of daily living, as well as engage in sports and leisure events or requirements of work or school.  Provide anticipatory guidance and reassurance about the benefit of exercise to maintain function; acknowledge and normalize fear that exercise may worsen symptoms.  Encourage regular exercise, at least 10 minutes at a time for 45 minutes per week; consider yoga, water exercise and proprioceptive exercises; encourage use of wearable activity tracker to increase motivation and adherence.  Encourage maintenance or resumption of daily activities, including employment, as pain allows and with minimal exposure to trauma.  Assist patient to advocate for adaptations to the work environment.  Consider level of pain and function, gender, age, lifestyle, patient preference, quality of life, readiness and ?ocapacity to benefit? when recommending patients for orthopaedic surgery consultation.  Explore strategies, such as changes to medication regimen or activity that enables patient to anticipate and manage flare-ups that increase deconditioning and disability.  Explore patient preferences; encourage exposure to a broader range of activities that have been avoided for fear of experiencing pain.  Identify barriers to participation in therapy or exercise, such as pain with activity, anticipated or imagined pain.  Monitor postoperative joint replacement or any preexisting joint replacement for ongoing pain and loss of function; provide social support and encouragement throughout  recovery.   Notes:        Depression Screen    04/18/2024    1:34 PM 07/29/2023    9:56 AM 07/08/2023   10:35 AM 06/09/2023    8:48 AM 05/19/2023    2:37 PM  PHQ 2/9 Scores  PHQ - 2 Score 0 0 0 0 0  PHQ- 9 Score  0 0  0    Fall Risk    07/29/2023    9:55 AM 07/08/2023   10:35 AM 06/09/2023    8:48 AM 06/02/2023    2:49  PM 05/19/2023    2:37 PM  Fall Risk   Falls in the past year? 0 1 1 1 1   Number falls in past yr: 0 1 1 1 1   Injury with Fall? 0 1 1 1 1   Risk for fall due to : History of fall(s);Impaired balance/gait;Impaired mobility History of fall(s);Impaired balance/gait;Impaired mobility History of fall(s);Impaired balance/gait Impaired balance/gait;History of fall(s)   Follow up Falls evaluation completed Falls evaluation completed Falls evaluation completed Falls evaluation completed Falls evaluation completed    MEDICARE RISK AT HOME: Medicare Risk at Home Any stairs in or around the home?: Yes If so, are there any without handrails?: No Home free of loose throw rugs in walkways, pet beds, electrical cords, etc?: Yes Adequate lighting in your home to reduce risk of falls?: Yes Life alert?: No Use of a cane, walker or w/c?: Yes Grab bars in the bathroom?: Yes Shower chair or bench in shower?: Yes Elevated toilet seat or a handicapped toilet?: Yes  TIMED UP AND GO:  Was the test performed?  Yes  Length of time to ambulate 10 feet: 8 sec Gait slow and steady with assistive device    Cognitive Function:        Immunizations Immunization History  Administered Date(s) Administered   Influenza, High Dose Seasonal PF 10/06/2016, 10/05/2017, 09/14/2018, 08/17/2022, 08/31/2023   Influenza, Quadrivalent, Recombinant, Inj, Pf 08/01/2020   Influenza,inj,Quad PF,6+ Mos 10/02/2014   Moderna Covid-19 Vaccine Bivalent Booster 32yrs & up 08/17/2023   PNEUMOCOCCAL CONJUGATE-20 03/16/2023   PPD Test 02/22/2023   Pneumococcal-Unspecified 03/29/2022   Tdap 09/29/2022, 09/06/2023   Unspecified SARS-COV-2 Vaccination 11/05/2019, 12/03/2019, 03/31/2021, 07/21/2021, 03/19/2022   Zoster Recombinant(Shingrix) 03/07/2023    TDAP status: Up to date  Flu Vaccine status: Up to date  Pneumococcal vaccine status: Up to date  Covid-19 vaccine status: Completed vaccines  Qualifies for Shingles Vaccine?  Yes   Zostavax completed Yes   Shingrix Completed?: No.    Education has been provided regarding the importance of this vaccine. Patient has been advised to call insurance company to determine out of pocket expense if they have not yet received this vaccine. Advised may also receive vaccine at local pharmacy or Health Dept. Verbalized acceptance and understanding.  Screening Tests Health Maintenance  Topic Date Due   OPHTHALMOLOGY EXAM  Never done   Zoster Vaccines- Shingrix (2 of 2) 05/02/2023   COVID-19 Vaccine (7 - 2024-25 season) 10/12/2023   INFLUENZA VACCINE  06/01/2024   HEMOGLOBIN A1C  09/27/2024   FOOT EXAM  04/18/2025   Medicare Annual Wellness (AWV)  04/18/2025   DTaP/Tdap/Td (3 - Td or Tdap) 09/05/2033   Pneumococcal Vaccine: 50+ Years  Completed   DEXA SCAN  Completed   HPV VACCINES  Aged Out   Meningococcal B Vaccine  Aged Out    Health Maintenance  Health Maintenance Due  Topic Date Due   OPHTHALMOLOGY EXAM  Never  done   Zoster Vaccines- Shingrix (2 of 2) 05/02/2023   COVID-19 Vaccine (7 - 2024-25 season) 10/12/2023    Colorectal cancer screening: No longer required.   Mammogram status: No longer required due to aged out.  Bone Density status: Ordered 04/18/24. Pt provided with contact info and advised to call to schedule appt.  Lung Cancer Screening: (Low Dose CT Chest recommended if Age 73-80 years, 20 pack-year currently smoking OR have quit w/in 15years.) does not qualify.   Lung Cancer Screening Referral: NA  Additional Screening:  Hepatitis C Screening: does not qualify;   Vision Screening: Recommended annual ophthalmology exams for early detection of glaucoma and other disorders of the eye. Is the patient up to date with their annual eye exam?  Yes Who is the provider or what is the name of the office in which the patient attends annual eye exams? Dr. Abigail If pt is not established with a provider, would they like to be referred to a provider to  establish care? No .   Dental Screening: Recommended annual dental exams for proper oral hygiene  Diabetic Foot Exam: Diabetic Foot Exam: Completed 04/18/24  Community Resource Referral / Chronic Care Management: CRR required this visit?  No   CCM required this visit?  No     Plan:     I have personally reviewed and noted the following in the patient's chart:   Medical and social history Use of alcohol, tobacco or illicit drugs  Current medications and supplements including opioid prescriptions. Patient is not currently taking opioid prescriptions. Functional ability and status Nutritional status Physical activity Advanced directives List of other physicians Hospitalizations, surgeries, and ER visits in previous 12 months Vitals Screenings to include cognitive, depression, and falls Referrals and appointments  In addition, I have reviewed and discussed with patient certain preventive protocols, quality metrics, and best practice recommendations. A written personalized care plan for preventive services as well as general preventive health recommendations were provided to patient.     Jolly Carlini X Massimiliano Rohleder, NP   04/20/2024   After Visit Summary: (In Person-Declined) Patient declined AVS at this time.

## 2024-04-20 DIAGNOSIS — M6281 Muscle weakness (generalized): Secondary | ICD-10-CM | POA: Diagnosis not present

## 2024-04-20 DIAGNOSIS — R2681 Unsteadiness on feet: Secondary | ICD-10-CM | POA: Diagnosis not present

## 2024-04-24 DIAGNOSIS — R2681 Unsteadiness on feet: Secondary | ICD-10-CM | POA: Diagnosis not present

## 2024-04-24 DIAGNOSIS — M6281 Muscle weakness (generalized): Secondary | ICD-10-CM | POA: Diagnosis not present

## 2024-04-27 DIAGNOSIS — M6281 Muscle weakness (generalized): Secondary | ICD-10-CM | POA: Diagnosis not present

## 2024-04-27 DIAGNOSIS — R2681 Unsteadiness on feet: Secondary | ICD-10-CM | POA: Diagnosis not present

## 2024-05-04 DIAGNOSIS — R2681 Unsteadiness on feet: Secondary | ICD-10-CM | POA: Diagnosis not present

## 2024-05-04 DIAGNOSIS — M6281 Muscle weakness (generalized): Secondary | ICD-10-CM | POA: Diagnosis not present

## 2024-05-08 DIAGNOSIS — L84 Corns and callosities: Secondary | ICD-10-CM | POA: Diagnosis not present

## 2024-05-08 DIAGNOSIS — B351 Tinea unguium: Secondary | ICD-10-CM | POA: Diagnosis not present

## 2024-05-08 DIAGNOSIS — E1159 Type 2 diabetes mellitus with other circulatory complications: Secondary | ICD-10-CM | POA: Diagnosis not present

## 2024-05-08 DIAGNOSIS — R2681 Unsteadiness on feet: Secondary | ICD-10-CM | POA: Diagnosis not present

## 2024-05-08 DIAGNOSIS — M79672 Pain in left foot: Secondary | ICD-10-CM | POA: Diagnosis not present

## 2024-05-08 DIAGNOSIS — M79671 Pain in right foot: Secondary | ICD-10-CM | POA: Diagnosis not present

## 2024-05-08 DIAGNOSIS — M6281 Muscle weakness (generalized): Secondary | ICD-10-CM | POA: Diagnosis not present

## 2024-05-11 DIAGNOSIS — R2681 Unsteadiness on feet: Secondary | ICD-10-CM | POA: Diagnosis not present

## 2024-05-11 DIAGNOSIS — M6281 Muscle weakness (generalized): Secondary | ICD-10-CM | POA: Diagnosis not present

## 2024-05-14 ENCOUNTER — Ambulatory Visit: Attending: Cardiology

## 2024-05-14 DIAGNOSIS — I5022 Chronic systolic (congestive) heart failure: Secondary | ICD-10-CM

## 2024-05-14 DIAGNOSIS — Z950 Presence of cardiac pacemaker: Secondary | ICD-10-CM | POA: Diagnosis not present

## 2024-05-15 DIAGNOSIS — M6281 Muscle weakness (generalized): Secondary | ICD-10-CM | POA: Diagnosis not present

## 2024-05-15 DIAGNOSIS — R2681 Unsteadiness on feet: Secondary | ICD-10-CM | POA: Diagnosis not present

## 2024-05-18 ENCOUNTER — Telehealth: Payer: Self-pay

## 2024-05-18 NOTE — Progress Notes (Signed)
 EPIC Encounter for ICM Monitoring  Patient Name: Grace Robinson is a 89 y.o. female Date: 05/18/2024 Primary Care Physican: Mast, Man X, NP Primary Cardiologist: Camnitz Electrophysiologist: Camnitz Bi-V Pacing:  98.1%     12/24/2022 Weight: 130 lbs 02/16/2024 Office Weight: 126 lbs 03/22/2024 Weight: 127 lbs per facility note   Since 09-Apr-2024  VT (>4 beats)  1 Time in AT/AF  0.0 hr/day (0.0%)          Attempted call to patient and unable to reach.   Transmission results reviewed.    Currently resides in ALF [facility] Room 8[address], Nurses station on [address].     Optivol Thoracic impedance suggesting intermittent fay with possible fluid accumulation within the last month.   Prescribed:  Furosemide  20 mg take 1 tablet (20 mg total) by mouth every other day alternating with 2 tablets (40 mg total) every other day.  Per [facility] Nurse 04/02/2024, Pt is taking 40 mg bid. Potassium 10 mEq 1 tablet twice a day.   Labs: 02/23/2024 Creatinine 0.9,   BUN 21, Potassium 4.1, Sodium 140 01/20/2024 Creatinine 0.85, BUN 22, Potassium 3.7, Sodium 137, GFR >60 10/07/2023 Creatinine 0.9,   BUN 17, Potassium 4.2, Sodium 142  09/27/2023 Creatinine 1.0,   BUN 21, Potassium 4.1, Sodium 137  09/10/2023 Creatinine 0.92, BUN 22, Potassium 3.8, Sodium 138  A complete set of results can be found in Results Review.   Recommendations:  Unable to reach.       Follow-up plan: ICM clinic phone appointment on 06/18/2024.   91 day device clinic remote transmission 05/30/2024.      EP/Cardiology Office Visits:  Recall 06/02/2024 with [name], NP.       Copy of ICM check sent to Dr. Inocencio.     3 month ICM trend: 05/14/2024.    12-14 Month ICM trend:     [name], RN 05/18/2024 1:54 PM

## 2024-05-18 NOTE — Telephone Encounter (Signed)
 Remote ICM transmission received.  Attempted call to patient regarding ICM remote transmission and no answer.

## 2024-05-24 DIAGNOSIS — M85852 Other specified disorders of bone density and structure, left thigh: Secondary | ICD-10-CM | POA: Diagnosis not present

## 2024-05-30 ENCOUNTER — Ambulatory Visit: Payer: Medicare Other

## 2024-05-30 DIAGNOSIS — I428 Other cardiomyopathies: Secondary | ICD-10-CM | POA: Diagnosis not present

## 2024-05-30 LAB — CUP PACEART REMOTE DEVICE CHECK
Battery Remaining Longevity: 122 mo
Battery Voltage: 3.02 V
Brady Statistic AP VP Percent: 31.77 %
Brady Statistic AP VS Percent: 0.48 %
Brady Statistic AS VP Percent: 66.33 %
Brady Statistic AS VS Percent: 1.42 %
Brady Statistic RA Percent Paced: 32.41 %
Brady Statistic RV Percent Paced: 0.51 %
Date Time Interrogation Session: 20250729222135
Implantable Lead Connection Status: 753985
Implantable Lead Connection Status: 753985
Implantable Lead Connection Status: 753985
Implantable Lead Connection Status: 753985
Implantable Lead Implant Date: 20031007
Implantable Lead Implant Date: 20031007
Implantable Lead Implant Date: 20031007
Implantable Lead Implant Date: 20060227
Implantable Lead Location: 753858
Implantable Lead Location: 753859
Implantable Lead Location: 753860
Implantable Lead Location: 753860
Implantable Lead Model: 4193
Implantable Lead Model: 4469
Implantable Lead Model: 4470
Implantable Lead Model: 6949
Implantable Lead Serial Number: 337988
Implantable Lead Serial Number: 361236
Implantable Pulse Generator Implant Date: 20231030
Lead Channel Impedance Value: 3363 Ohm
Lead Channel Impedance Value: 3363 Ohm
Lead Channel Impedance Value: 3363 Ohm
Lead Channel Impedance Value: 361 Ohm
Lead Channel Impedance Value: 361 Ohm
Lead Channel Impedance Value: 399 Ohm
Lead Channel Impedance Value: 494 Ohm
Lead Channel Impedance Value: 532 Ohm
Lead Channel Impedance Value: 589 Ohm
Lead Channel Pacing Threshold Amplitude: 0.625 V
Lead Channel Pacing Threshold Amplitude: 1.875 V
Lead Channel Pacing Threshold Pulse Width: 0.4 ms
Lead Channel Pacing Threshold Pulse Width: 0.4 ms
Lead Channel Sensing Intrinsic Amplitude: 1.375 mV
Lead Channel Sensing Intrinsic Amplitude: 1.375 mV
Lead Channel Sensing Intrinsic Amplitude: 31.625 mV
Lead Channel Sensing Intrinsic Amplitude: 31.625 mV
Lead Channel Setting Pacing Amplitude: 1.5 V
Lead Channel Setting Pacing Amplitude: 1.5 V
Lead Channel Setting Pacing Amplitude: 3.75 V
Lead Channel Setting Pacing Pulse Width: 0.4 ms
Lead Channel Setting Pacing Pulse Width: 0.8 ms
Lead Channel Setting Sensing Sensitivity: 1.2 mV
Zone Setting Status: 755011

## 2024-06-02 ENCOUNTER — Ambulatory Visit: Payer: Self-pay | Admitting: Cardiology

## 2024-06-07 DIAGNOSIS — M170 Bilateral primary osteoarthritis of knee: Secondary | ICD-10-CM | POA: Diagnosis not present

## 2024-06-07 DIAGNOSIS — M1712 Unilateral primary osteoarthritis, left knee: Secondary | ICD-10-CM | POA: Diagnosis not present

## 2024-06-11 DIAGNOSIS — D044 Carcinoma in situ of skin of scalp and neck: Secondary | ICD-10-CM | POA: Diagnosis not present

## 2024-06-11 DIAGNOSIS — L570 Actinic keratosis: Secondary | ICD-10-CM | POA: Diagnosis not present

## 2024-06-18 ENCOUNTER — Ambulatory Visit: Attending: Cardiology

## 2024-06-18 DIAGNOSIS — Z950 Presence of cardiac pacemaker: Secondary | ICD-10-CM | POA: Diagnosis not present

## 2024-06-18 DIAGNOSIS — I5022 Chronic systolic (congestive) heart failure: Secondary | ICD-10-CM | POA: Diagnosis not present

## 2024-06-18 DIAGNOSIS — M1711 Unilateral primary osteoarthritis, right knee: Secondary | ICD-10-CM | POA: Diagnosis not present

## 2024-06-18 DIAGNOSIS — M170 Bilateral primary osteoarthritis of knee: Secondary | ICD-10-CM | POA: Diagnosis not present

## 2024-06-22 ENCOUNTER — Telehealth: Payer: Self-pay

## 2024-06-22 NOTE — Progress Notes (Signed)
 EPIC Encounter for ICM Monitoring  Patient Name: Grace Robinson is a 89 y.o. female Date: 06/22/2024 Primary Care Physican: Mast, Man X, NP Primary Cardiologist: Camnitz Electrophysiologist: Camnitz Bi-V Pacing:  97.8%     12/24/2022 Weight: 130 lbs 02/16/2024 Office Weight: 126 lbs 03/22/2024 Weight: 127 lbs per facility note   Since 29-May-2024 VT (>4 beats)  1 Time in AT/AF  <0.1 hr/day (<0.1%)          Attempted call to patient and unable to reach.   Transmission results reviewed.    Currently resides in ALF [facility] Room 823, Nurses station on [address].     Optivol Thoracic impedance suggesting normal fluid levels with the exception of possible fluid accumulation from 7/28-8/8.   Prescribed:  Furosemide  20 mg take 1 tablet (20 mg total) by mouth every other day alternating with 2 tablets (40 mg total) every other day.  Per [facility] Nurse 04/02/2024, Pt is taking 40 mg bid. Potassium 10 mEq 1 tablet twice a day.   Labs: 02/23/2024 Creatinine 0.9,   BUN 21, Potassium 4.1, Sodium 140 01/20/2024 Creatinine 0.85, BUN 22, Potassium 3.7, Sodium 137, GFR >60 10/07/2023 Creatinine 0.9,   BUN 17, Potassium 4.2, Sodium 142  09/27/2023 Creatinine 1.0,   BUN 21, Potassium 4.1, Sodium 137  09/10/2023 Creatinine 0.92, BUN 22, Potassium 3.8, Sodium 138  A complete set of results can be found in Results Review.   Recommendations:  Unable to reach.     Follow-up plan: ICM clinic phone appointment on 07/23/2024.   91 day device clinic remote transmission 08/29/2024.      EP/Cardiology Office Visits:  Recall 06/02/2024 with [name], NP (6 month).       Copy of ICM check sent to Dr. Inocencio.     3 month ICM trend: 06/18/2024.    12-14 Month ICM trend:     Grace GORMAN Garner, RN 06/22/2024 8:57 AM

## 2024-06-22 NOTE — Telephone Encounter (Signed)
 Remote ICM transmission received.  Attempted call to patient regarding ICM remote transmission and no answer or voice mail option.

## 2024-06-25 ENCOUNTER — Telehealth: Payer: Self-pay | Admitting: Orthopedic Surgery

## 2024-06-25 NOTE — Telephone Encounter (Signed)
 Dinner blood sugar 514. Advised to give SSI 10 units and recheck about 30-45 minutes after administered.

## 2024-07-20 ENCOUNTER — Encounter: Payer: Self-pay | Admitting: Nurse Practitioner

## 2024-07-20 ENCOUNTER — Non-Acute Institutional Stay: Payer: Self-pay | Admitting: Nurse Practitioner

## 2024-07-20 DIAGNOSIS — I5022 Chronic systolic (congestive) heart failure: Secondary | ICD-10-CM | POA: Diagnosis not present

## 2024-07-20 NOTE — Assessment & Plan Note (Signed)
 fatigue and frequent loose stools for a couple of days.  The patient denied headache, abdominal pain, nausea, vomiting, indigestion, chest pain/pressure, palpitations, increased urinary frequency or burning sensation upon urination.  She is afebrile.  The patient stated that she had IBS with diarrhea in the past which is a similar to current event, declined workup or treatment except Imodium  as needed

## 2024-07-20 NOTE — Assessment & Plan Note (Signed)
 on Toujeo  to 35u every day,  Insulin  Aspart 10u if CBG > 401-500, 3u with meals-hold <70, Hgb A1c 7.5 02/25/23<<9.3 10/06/23>>8.0 03/27/24  taking Atorvastatin , LDL 58 02/24/23

## 2024-07-20 NOTE — Progress Notes (Signed)
 Location:   [facility] Room Number: 804 Place of Service:  ALF (13) Provider: Larwance Jehan Bonano NP  Azie Mcconahy X, NP  Patient Care Team: Taleeyah Bora X, NP as PCP - General (Internal Medicine) Fernande Elspeth BROCKS, MD as PCP - Electrophysiology (Cardiology) Vernetta Berg, MD as Consulting Physician (General Surgery) Loretha Ash, MD as Consulting Physician (Hematology and Oncology) Dewey Rush, MD as Consulting Physician (Radiation Oncology)  Extended Emergency Contact Information Primary Emergency Contact: Drury Fairy BRAVO Address: [address]          [place], [place] [address] [place] Home Phone: [phone] Mobile Phone: [phone] Relation: Son  Code Status: DNR Goals of care: Advanced Directive information    03/22/2024   11:23 AM  Advanced Directives  Does Patient Have a Medical Advance Directive? Yes  Type of Advance Directive Out of facility DNR (pink MOST or yellow form)  Does patient want to make changes to medical advance directive? No - Patient declined     Chief Complaint  Patient presents with   Acute Visit    Fatigue and a looser stools    HPI:  Pt is a 89 y.o. female seen today for an acute visit for fatigue and frequent loose stools for a couple of days.  The patient denied headache, abdominal pain, nausea, vomiting, indigestion, chest pain/pressure, palpitations, increased urinary frequency or burning sensation upon urination.  She is afebrile.  The patient stated that she had IBS with diarrhea in the past which is a similar to current event, declined workup or treatment except Imodium  as needed   R ankle fx, healed, last f/u Ortho 11/01/23 WBAT, mild swelling RLE              Healed multiple fractures sustained from fall 02/18/23: pelvic fractures: nondisplaced fxs of R superior and inferior pubic rami, minimally displaced fx of the inferior R sacral ala extended to the R SIJ. Mild compression fx of superior endplates of T12, L2.  Prn Tylenol   Fall, mechanical T2DM/renal complication, on Toujeo  to 35u every day,  Insulin  Aspart 10u if CBG > 401-500, 3u with meals-hold <70, Hgb A1c 7.5 02/25/23<<9.3 10/06/23>>8.0 03/27/24 Mild bilateral hydronephrosis, no stones Sigmoid diverticulitis/IBS taking Bentyl , Imodium  Aortic atherosclerosis, taking ASA, Atorvastatin  HLD, taking Atorvastatin , LDL 58 02/24/23 Vit B12 deficiency, takes Vit B12, Hgb 14.0 01/20/24 Vit D deficiency, on Vit D, Vit D 41 02/24/23 CHF, taking Furosemide , Bun/creat 21/0.9 02/23/24 HTN, taking Metoprolol , Olmesartan  OP 03/03/23 DEXA t score -1.236, on Ca, Vit D, on Fosamax  70mg  qwk              S/p R breat cancer lumpectomy, f/u surgeon, 03/15/23 cancer center radiation tx             Radiation dermatitis right upper chest/breast, healed.     Past Medical History:  Diagnosis Date   Biventricular ICD (implantable cardiac defibrillator) in place    downgraded from CRT-D to CRT-P 08/30/22   Breast cancer (HCC)    Cellulitis    Change in bowel habits    CHF (congestive heart failure) (HCC)    Colon polyps    Diabetes mellitus    Type II   Diarrhea    Dyslipidemia    HOH (hard of hearing)    left   HTN (hypertension)    LBBB (left bundle branch block)    chronic   Meniere's disease    Nonischemic cardiomyopathy (HCC) 2006   Presence of permanent cardiac pacemaker  T wave oversensing 01/17/2014   Ulcerative colitis    Varicose veins    4 lazer  treatment each leg   Past Surgical History:  Procedure Laterality Date   ABDOMINAL HYSTERECTOMY     BIV PACEMAKER GENERATOR CHANGEOUT N/A 08/30/2022   Procedure: BIV PACEMAKER GENERATOR CHANGEOUT;  Surgeon: Fernande Elspeth BROCKS, MD;  Location: [facility] INVASIVE CV LAB;  Service: Cardiovascular;  Laterality: N/A;   BREAST LUMPECTOMY WITH RADIOACTIVE SEED LOCALIZATION Right 01/05/2023   Procedure: RIGHT BREAST BRACKETED LUMPECTOMY WITH RADIOACTIVE SEED LOCALIZATION;  Surgeon: Vernetta Berg, MD;  Location: [facility] OR;   Service: General;  Laterality: Right;   CARDIAC CATHETERIZATION  08/2011   CAST APPLICATION Right 09/06/2023   Procedure: CAST APPLICATION;  Surgeon: Jerri Kay HERO, MD;  Location: [facility] OR;  Service: Orthopedics;  Laterality: Right;   CHOLECYSTECTOMY     COLONOSCOPY     EP Study  08/22/08   I & D EXTREMITY Right 09/06/2023   Procedure: IRRIGATION AND DEBRIDEMENT RIGHT ANKLE;  Surgeon: Jerri Kay HERO, MD;  Location: [facility] OR;  Service: Orthopedics;  Laterality: Right;   IMPLANTABLE CARDIOVERTER DEFIBRILLATOR GENERATOR CHANGE N/A 08/22/2013   Procedure: IMPLANTABLE CARDIOVERTER DEFIBRILLATOR GENERATOR CHANGE;  Surgeon: Elspeth BROCKS Fernande, MD;  Location: [facility] CATH LAB;  Service: Cardiovascular;  Laterality: N/A;   KNEE ARTHROSCOPY Left 04/14/2016   Procedure: LEFT KNEE ARTHROSCOPY, PARTIAL MEDIAL AND PARTIAL LATERAL MENISCECTOMY, MEDIAL PLICA;  Surgeon: Norleen Gavel, MD;  Location: [facility] OR;  Service: Orthopedics;  Laterality: Left;   mastoid sugery     in the setting of Meniere's disease   ORIF ANKLE FRACTURE Right 09/09/2023   Procedure: OPEN REDUCTION INTERNAL FIXATION (ORIF) ANKLE FRACTURE;  Surgeon: Harden Jerona GAILS, MD;  Location: [facility] OR;  Service: Orthopedics;  Laterality: Right;   TONSILLECTOMY AND ADENOIDECTOMY      Allergies  Allergen Reactions   Coreg [Carvedilol] Other (See Comments)    Unknown reaction   Firvanq [Vancomycin] Itching and Other (See Comments)    Red Annmarie Plemmons Syndrome   Lactose Intolerance (Gi)    Lanoxin [Digoxin] Other (See Comments)    DIG toxicity   Sudafed [Pseudoephedrine] Other (See Comments)    Makes skin feel like its crawling     Allergies as of 07/20/2024       Reactions   Coreg [carvedilol] Other (See Comments)   Unknown reaction   Firvanq [vancomycin] Itching, Other (See Comments)   Red Samentha Perham Syndrome   Lactose Intolerance (gi)    Lanoxin [digoxin] Other (See Comments)   DIG toxicity   Sudafed [pseudoephedrine] Other (See Comments)   Makes skin feel like its crawling         Medication List        Accurate as of July 20, 2024 11:59 PM. If you have any questions, ask your nurse or doctor.          acetaminophen  500 MG tablet Commonly known as: TYLENOL  Take 500 mg by mouth every 8 (eight) hours as needed for headache.   aspirin  EC 81 MG tablet Take 81 mg by mouth daily.   aspirin  EC 81 MG tablet Take 1 tablet (81 mg total) by mouth daily. Swallow whole.   atorvastatin  40 MG tablet Commonly known as: LIPITOR TAKE 1 TABLET(40 MG) BY MOUTH DAILY   Voltaren  Arthritis Pain 1 % Gel Generic drug: diclofenac  Sodium Apply topically every 8 (eight) hours as needed (Apply to bilateral knees).   diclofenac  Sodium 1 % Gel Commonly known as: Voltaren  Arthritis Pain  Apply 2 g topically 4 (four) times daily. Apply to R ankle topically four times a day for Reddened areas APPLY AWAY FROM OPENED AREAS   furosemide  40 MG tablet Commonly known as: LASIX  Take 40 mg by mouth 2 (two) times daily.   furosemide  20 MG tablet Commonly known as: LASIX  TAKE 1 TABLET BY MOUTH DAILY AS DIRECTED, ALTERNATING 40MG  EVERY OTHER DAY   hydrALAZINE  50 MG tablet Commonly known as: APRESOLINE  Take 50 mg by mouth 2 (two) times daily.   INSULIN  ASPART IJ Inject 10 Units into the skin 3 (three) times daily before meals. Inject as per sliding scale: if 0 - 400 = 0 if below 400 do not give insulin ; 401 - 500 = 10 Give 10 units for CBG 401-500. If CBG is above 500 notify MD/NP., subcutaneously before meals and at bedtime   loperamide  2 MG tablet Commonly known as: IMODIUM  A-D Take 2 mg by mouth every 6 (six) hours as needed for diarrhea or loose stools.   metoprolol  succinate 100 MG 24 hr tablet Commonly known as: TOPROL -XL TAKE 1 TABLET(100 MG) BY MOUTH TWICE DAILY   olmesartan  40 MG tablet Commonly known as: BENICAR  TAKE 1 TABLET(40 MG) BY MOUTH DAILY   ondansetron  4 MG tablet Commonly known as: ZOFRAN  Take 4 mg by mouth every 6 (six) hours as needed.    polyethylene glycol 17 g packet Commonly known as: MIRALAX  / GLYCOLAX  Take 17 g by mouth daily as needed for moderate constipation.   potassium chloride  10 MEQ tablet Commonly known as: KLOR-CON  Take 20 mEq by mouth 2 (two) times daily.   potassium chloride  10 MEQ tablet Commonly known as: KLOR-CON  M TAKE 1 TABLET(10 MEQ) BY MOUTH TWICE DAILY   risedronate 35 MG tablet Commonly known as: ACTONEL Take 35 mg by mouth every Sunday. Give 1 tablet by mouth one time a day every Sun for post menopause   Toujeo  Max SoloStar 300 UNIT/ML Solostar Pen Generic drug: insulin  glargine (2 Unit Dial) Inject 45 Units into the skin daily.   Toujeo  Max SoloStar 300 UNIT/ML Solostar Pen Generic drug: insulin  glargine (2 Unit Dial) ADMINISTER 35 UNITS UNDER THE SKIN DAILY        Review of Systems  Constitutional:  Positive for appetite change and fatigue. Negative for fever.  HENT:  Positive for hearing loss. Negative for congestion and trouble swallowing.   Eyes:  Negative for visual disturbance.  Respiratory:  Negative for cough, chest tightness and wheezing.   Cardiovascular:  Positive for leg swelling.  Gastrointestinal:  Positive for diarrhea. Negative for abdominal pain, blood in stool, nausea and vomiting.  Genitourinary:  Negative for dysuria and urgency.  Musculoskeletal:  Positive for arthralgias, back pain and gait problem.       Right lower leg pain occasionally Chronic R+L knee pain, ambulates with walker, s/p inj  Skin:  Negative for color change.  Neurological:  Negative for tremors and headaches.  Psychiatric/Behavioral:  Negative for behavioral problems and sleep disturbance. The patient is not nervous/anxious.     Immunization History  Administered Date(s) Administered   INFLUENZA, HIGH DOSE SEASONAL PF 10/06/2016, 10/05/2017, 09/14/2018, 08/17/2022, 08/31/2023   Influenza, Quadrivalent, Recombinant, Inj, Pf 08/01/2020   Influenza,inj,Quad PF,6+ Mos 10/02/2014    Moderna Covid-19 Vaccine Bivalent Booster 62yrs & up 08/17/2023   PNEUMOCOCCAL CONJUGATE-20 03/16/2023   PPD Test 02/22/2023   Pneumococcal-Unspecified 03/29/2022   Tdap 09/29/2022, 09/06/2023   Unspecified SARS-COV-2 Vaccination 11/05/2019, 12/03/2019, 03/31/2021, 07/21/2021, 03/19/2022   Zoster Recombinant(Shingrix)  03/07/2023   Pertinent  Health Maintenance Due  Topic Date Due   OPHTHALMOLOGY EXAM  Never done   Influenza Vaccine  06/01/2024   HEMOGLOBIN A1C  09/27/2024   FOOT EXAM  04/18/2025   DEXA SCAN  Completed      05/19/2023    2:37 PM 06/02/2023    2:49 PM 06/09/2023    8:48 AM 07/08/2023   10:35 AM 07/29/2023    9:55 AM  Fall Risk  Falls in the past year? 1 1 1 1  0  Was there an injury with Fall? 1 1 1 1  0  Fall Risk Category Calculator 3 3 3 3  0  Patient at Risk for Falls Due to  Impaired balance/gait;History of fall(s) History of fall(s);Impaired balance/gait History of fall(s);Impaired balance/gait;Impaired mobility History of fall(s);Impaired balance/gait;Impaired mobility  Fall risk Follow up Falls evaluation completed Falls evaluation completed Falls evaluation completed Falls evaluation completed Falls evaluation completed   Functional Status Survey:    Vitals:   07/20/24 1157  BP: 107/64  Pulse: 74  Resp: 18  Temp: 97.7 F (36.5 C)  SpO2: 97%   There is no height or weight on file to calculate BMI. Physical Exam Vitals and nursing note reviewed.  Constitutional:      Comments: Appears tired, answered questions appropriately  HENT:     Head: Normocephalic and atraumatic.     Nose: Nose normal.     Mouth/Throat:     Mouth: Mucous membranes are moist.  Eyes:     Extraocular Movements: Extraocular movements intact.     Conjunctiva/sclera: Conjunctivae normal.     Pupils: Pupils are equal, round, and reactive to light.  Cardiovascular:     Rate and Rhythm: Normal rate and regular rhythm.     Heart sounds: No murmur heard.    Comments: Pacemaker left  upper chest(defibrillator was removed per patient's request in the past) No dorsalis pedis pulses or posterior tibial pulses felt.  Pulmonary:     Effort: Pulmonary effort is normal.     Breath sounds: No rales.  Abdominal:     General: Bowel sounds are normal.     Palpations: Abdomen is soft.     Tenderness: There is no abdominal tenderness. There is no right CVA tenderness, left CVA tenderness, guarding or rebound.  Musculoskeletal:     Cervical back: Normal range of motion and neck supple.     Right lower leg: Edema present.     Left lower leg: Edema present.     Comments: Lower back, pelvic/thigh pain with movement/weight bearing.  Edema trace BLE R>L Pain in the lower right leg above the right ankle pain when walking, no redness, warmth, open wound, or deformity.   Skin:    General: Skin is warm and dry.     Comments: S/p R breat cancer lumpectomy, no apparent edema R arm.   Radiation dermatitis right upper chest/breast, healed.    Neurological:     General: No focal deficit present.     Mental Status: She is alert and oriented to person, place, and time. Mental status is at baseline.     Gait: Gait abnormal.     Comments: Ambulates with walker.   Psychiatric:        Mood and Affect: Mood normal.        Behavior: Behavior normal.        Thought Content: Thought content normal.     Labs reviewed: Recent Labs    09/08/23 0615 09/09/23  0559 09/10/23 0530 09/27/23 0000 10/07/23 0000 01/20/24 1346 02/23/24 0000  NA 140 141 138   < > 142 137 140  K 3.3* 4.3 3.8   < > 4.2 3.7 4.1  CL 106 108 102   < > 105 99 105  CO2 24 25 25    < > 28* 26 23*  GLUCOSE 199* 143* 181*  --   --  283*  --   BUN 25* 23 22   < > 17 22 21   CREATININE 0.93 0.94 0.92   < > 0.9 0.85 0.9  CALCIUM  8.5* 8.7* 8.3*   < > 8.5* 9.0 9.0  MG 2.0 2.1 2.2  --   --   --   --    < > = values in this interval not displayed.   Recent Labs    09/27/23 0000 09/28/23 0000 10/07/23 0000  AST  --  15 20   ALT  --  16 16  ALKPHOS  --  147* 103  ALBUMIN 3.3*  --  3.3*   Recent Labs    09/09/23 0559 09/10/23 0530 09/28/23 0000 10/07/23 0000 01/20/24 1346  WBC 11.9* 8.1 4.9 4.6 6.8  NEUTROABS  --   --  3,244.00 2,838.00 5.6  HGB 11.6* 10.5* 11.0* 11.6* 14.0  HCT 34.9* 31.9* 34* 35* 43.7  MCV 97.5 95.8  --   --  97.5  PLT 136* 119* 138* 145* 157   Lab Results  Component Value Date   TSH 1.93 02/25/2023   Lab Results  Component Value Date   HGBA1C 7.5 02/25/2023   Lab Results  Component Value Date   CHOL 116 02/25/2023   HDL 35 02/25/2023   LDLCALC 58 02/25/2023   TRIG 144 02/25/2023   CHOLHDL 2.7 11/10/2020    Significant Diagnostic Results in last 30 days:  No results found.  Assessment/Plan: IBS (irritable bowel syndrome) fatigue and frequent loose stools for a couple of days.  The patient denied headache, abdominal pain, nausea, vomiting, indigestion, chest pain/pressure, palpitations, increased urinary frequency or burning sensation upon urination.  She is afebrile.  The patient stated that she had IBS with diarrhea in the past which is a similar to current event, declined workup or treatment except Imodium  as needed  Type 2 diabetes mellitus with renal complication (HCC) on Toujeo  to 35u every day,  Insulin  Aspart 10u if CBG > 401-500, 3u with meals-hold <70, Hgb A1c 7.5 02/25/23<<9.3 10/06/23>>8.0 03/27/24  taking Atorvastatin , LDL 58 02/24/23  SYSTOLIC HEART FAILURE, CHRONIC Euvolemic,  taking Furosemide , Bun/creat 21/0.9 02/23/24  HTN (hypertension) Blood pressure is controlled,  taking Metoprolol , Olmesartan     Family/ staff Communication: Plan of care reviewed with the patient and charge nurse  Labs/tests ordered: None

## 2024-07-20 NOTE — Assessment & Plan Note (Signed)
 Euvolemic,  taking Furosemide , Bun/creat 21/0.9 02/23/24

## 2024-07-20 NOTE — Assessment & Plan Note (Signed)
Blood pressure is controlled,  taking Metoprolol, Olmesartan 

## 2024-07-20 NOTE — Progress Notes (Signed)
 ICM remote transmission rescheduled for 08/13/2024.

## 2024-07-23 ENCOUNTER — Encounter

## 2024-07-25 ENCOUNTER — Non-Acute Institutional Stay: Payer: Self-pay | Admitting: Nurse Practitioner

## 2024-07-25 ENCOUNTER — Encounter: Payer: Self-pay | Admitting: Nurse Practitioner

## 2024-07-25 DIAGNOSIS — E1122 Type 2 diabetes mellitus with diabetic chronic kidney disease: Secondary | ICD-10-CM | POA: Diagnosis not present

## 2024-07-25 DIAGNOSIS — Z794 Long term (current) use of insulin: Secondary | ICD-10-CM

## 2024-07-25 DIAGNOSIS — K580 Irritable bowel syndrome with diarrhea: Secondary | ICD-10-CM | POA: Diagnosis not present

## 2024-07-25 DIAGNOSIS — S91001A Unspecified open wound, right ankle, initial encounter: Secondary | ICD-10-CM | POA: Diagnosis not present

## 2024-07-25 DIAGNOSIS — N183 Chronic kidney disease, stage 3 unspecified: Secondary | ICD-10-CM

## 2024-07-25 DIAGNOSIS — I10 Essential (primary) hypertension: Secondary | ICD-10-CM

## 2024-07-25 DIAGNOSIS — I5022 Chronic systolic (congestive) heart failure: Secondary | ICD-10-CM | POA: Diagnosis not present

## 2024-07-25 NOTE — Assessment & Plan Note (Signed)
 reopened wound surgical site lateral right ankle, serous drainage, periwound redness, warmth, and tenderness.   R ankle fx, healed, last f/u Ortho 11/01/23 WBAT, mild swelling RLE

## 2024-07-25 NOTE — Progress Notes (Signed)
 Location:   [facility] Room Number: 804 Place of Service:  ALF (13) Provider: Larwance Chelsey Kimberley NP  Tallia Moehring X, NP  Patient Care Team: Warwick Nick X, NP as PCP - General (Internal Medicine) Fernande Elspeth BROCKS, MD as PCP - Electrophysiology (Cardiology) Vernetta Berg, MD as Consulting Physician (General Surgery) Loretha Ash, MD as Consulting Physician (Hematology and Oncology) Dewey Rush, MD as Consulting Physician (Radiation Oncology)  Extended Emergency Contact Information Primary Emergency Contact: Drury Fairy BRAVO Address: [address]          [place], [place] [address] [place] Home Phone: [phone] Mobile Phone: [phone] Relation: Son  Code Status: DNR Goals of care: Advanced Directive information    03/22/2024   11:23 AM  Advanced Directives  Does Patient Have a Medical Advance Directive? Yes  Type of Advance Directive Out of facility DNR (pink MOST or yellow form)  Does patient want to make changes to medical advance directive? No - Patient declined     Chief Complaint  Patient presents with   Acute Visit    We obtained area lateral right ankle where had a surgery    HPI:  Pt is a 89 y.o. female seen today for an acute visit for reopened wound surgical site lateral right ankle, serous drainage, periwound redness, warmth, and tenderness.   R ankle fx, healed, last f/u Ortho 11/01/23 WBAT, mild swelling RLE              Healed multiple fractures sustained from fall 02/18/23: pelvic fractures: nondisplaced fxs of R superior and inferior pubic rami, minimally displaced fx of the inferior R sacral ala extended to the R SIJ. Mild compression fx of superior endplates of T12, L2. Prn Tylenol   Fall, mechanical T2DM/renal complication, on Toujeo  to 35u every day,  Insulin  Aspart 10u if CBG > 401-500, 3u with meals-hold <70, Hgb A1c 7.5 02/25/23<<9.3 10/06/23>>8.0 03/27/24 Mild bilateral hydronephrosis, no stones Sigmoid diverticulitis/IBS  taking Bentyl , Imodium , on and off diarrhea Aortic atherosclerosis, taking ASA, Atorvastatin  HLD, taking Atorvastatin , LDL 58 02/24/23 Vit B12 deficiency, takes Vit B12, Hgb 14.0 01/20/24 Vit D deficiency, on Vit D, Vit D 41 02/24/23 CHF, taking Furosemide , Bun/creat 21/0.9 02/23/24 HTN, taking Metoprolol , Olmesartan  OP 03/03/23 DEXA t score -1.236, on Ca, Vit D, on Fosamax  70mg  qwk              S/p R breat cancer lumpectomy, f/u surgeon, 03/15/23 cancer center radiation tx             Radiation dermatitis right upper chest/breast, healed.       Past Medical History:  Diagnosis Date   Biventricular ICD (implantable cardiac defibrillator) in place    downgraded from CRT-D to CRT-P 08/30/22   Breast cancer (HCC)    Cellulitis    Change in bowel habits    CHF (congestive heart failure) (HCC)    Colon polyps    Diabetes mellitus    Type II   Diarrhea    Dyslipidemia    HOH (hard of hearing)    left   HTN (hypertension)    LBBB (left bundle branch block)    chronic   Meniere's disease    Nonischemic cardiomyopathy (HCC) 2006   Presence of permanent cardiac pacemaker    T wave oversensing 01/17/2014   Ulcerative colitis    Varicose veins    4 lazer  treatment each leg   Past Surgical History:  Procedure Laterality Date   ABDOMINAL HYSTERECTOMY  BIV PACEMAKER GENERATOR CHANGEOUT N/A 08/30/2022   Procedure: BIV PACEMAKER GENERATOR CHANGEOUT;  Surgeon: Fernande Elspeth BROCKS, MD;  Location: [facility] INVASIVE CV LAB;  Service: Cardiovascular;  Laterality: N/A;   BREAST LUMPECTOMY WITH RADIOACTIVE SEED LOCALIZATION Right 01/05/2023   Procedure: RIGHT BREAST BRACKETED LUMPECTOMY WITH RADIOACTIVE SEED LOCALIZATION;  Surgeon: Vernetta Berg, MD;  Location: [facility] OR;  Service: General;  Laterality: Right;   CARDIAC CATHETERIZATION  08/2011   CAST APPLICATION Right 09/06/2023   Procedure: CAST APPLICATION;  Surgeon: Jerri Kay HERO, MD;  Location: [facility] OR;  Service: Orthopedics;  Laterality: Right;    CHOLECYSTECTOMY     COLONOSCOPY     EP Study  08/22/08   I & D EXTREMITY Right 09/06/2023   Procedure: IRRIGATION AND DEBRIDEMENT RIGHT ANKLE;  Surgeon: Jerri Kay HERO, MD;  Location: [facility] OR;  Service: Orthopedics;  Laterality: Right;   IMPLANTABLE CARDIOVERTER DEFIBRILLATOR GENERATOR CHANGE N/A 08/22/2013   Procedure: IMPLANTABLE CARDIOVERTER DEFIBRILLATOR GENERATOR CHANGE;  Surgeon: Elspeth BROCKS Fernande, MD;  Location: [facility] CATH LAB;  Service: Cardiovascular;  Laterality: N/A;   KNEE ARTHROSCOPY Left 04/14/2016   Procedure: LEFT KNEE ARTHROSCOPY, PARTIAL MEDIAL AND PARTIAL LATERAL MENISCECTOMY, MEDIAL PLICA;  Surgeon: Norleen Gavel, MD;  Location: [facility] OR;  Service: Orthopedics;  Laterality: Left;   mastoid sugery     in the setting of Meniere's disease   ORIF ANKLE FRACTURE Right 09/09/2023   Procedure: OPEN REDUCTION INTERNAL FIXATION (ORIF) ANKLE FRACTURE;  Surgeon: Harden Jerona GAILS, MD;  Location: [facility] OR;  Service: Orthopedics;  Laterality: Right;   TONSILLECTOMY AND ADENOIDECTOMY      Allergies  Allergen Reactions   Coreg [Carvedilol] Other (See Comments)    Unknown reaction   Firvanq [Vancomycin] Itching and Other (See Comments)    Red Iraida Cragin Syndrome   Lactose Intolerance (Gi)    Lanoxin [Digoxin] Other (See Comments)    DIG toxicity   Sudafed [Pseudoephedrine] Other (See Comments)    Makes skin feel like its crawling     Allergies as of 07/25/2024       Reactions   Coreg [carvedilol] Other (See Comments)   Unknown reaction   Firvanq [vancomycin] Itching, Other (See Comments)   Red Desani Sprung Syndrome   Lactose Intolerance (gi)    Lanoxin [digoxin] Other (See Comments)   DIG toxicity   Sudafed [pseudoephedrine] Other (See Comments)   Makes skin feel like its crawling        Medication List        Accurate as of July 25, 2024  1:55 PM. If you have any questions, ask your nurse or doctor.          acetaminophen  500 MG tablet Commonly known as: TYLENOL  Take 500 mg by mouth every  8 (eight) hours as needed for headache.   aspirin  EC 81 MG tablet Take 81 mg by mouth daily.   aspirin  EC 81 MG tablet Take 1 tablet (81 mg total) by mouth daily. Swallow whole.   atorvastatin  40 MG tablet Commonly known as: LIPITOR TAKE 1 TABLET(40 MG) BY MOUTH DAILY   Voltaren  Arthritis Pain 1 % Gel Generic drug: diclofenac  Sodium Apply topically every 8 (eight) hours as needed (Apply to bilateral knees).   diclofenac  Sodium 1 % Gel Commonly known as: Voltaren  Arthritis Pain Apply 2 g topically 4 (four) times daily. Apply to R ankle topically four times a day for Reddened areas APPLY AWAY FROM OPENED AREAS   furosemide  40 MG tablet Commonly known as: LASIX  Take 40 mg  by mouth 2 (two) times daily.   furosemide  20 MG tablet Commonly known as: LASIX  TAKE 1 TABLET BY MOUTH DAILY AS DIRECTED, ALTERNATING 40MG  EVERY OTHER DAY   hydrALAZINE  50 MG tablet Commonly known as: APRESOLINE  Take 50 mg by mouth 2 (two) times daily.   INSULIN  ASPART IJ Inject 10 Units into the skin 3 (three) times daily before meals. Inject as per sliding scale: if 0 - 400 = 0 if below 400 do not give insulin ; 401 - 500 = 10 Give 10 units for CBG 401-500. If CBG is above 500 notify MD/NP., subcutaneously before meals and at bedtime   loperamide  2 MG tablet Commonly known as: IMODIUM  A-D Take 2 mg by mouth every 6 (six) hours as needed for diarrhea or loose stools.   metoprolol  succinate 100 MG 24 hr tablet Commonly known as: TOPROL -XL TAKE 1 TABLET(100 MG) BY MOUTH TWICE DAILY   olmesartan  40 MG tablet Commonly known as: BENICAR  TAKE 1 TABLET(40 MG) BY MOUTH DAILY   ondansetron  4 MG tablet Commonly known as: ZOFRAN  Take 4 mg by mouth every 6 (six) hours as needed.   polyethylene glycol 17 g packet Commonly known as: MIRALAX  / GLYCOLAX  Take 17 g by mouth daily as needed for moderate constipation.   potassium chloride  10 MEQ tablet Commonly known as: KLOR-CON  Take 20 mEq by mouth 2 (two) times  daily.   potassium chloride  10 MEQ tablet Commonly known as: KLOR-CON  M TAKE 1 TABLET(10 MEQ) BY MOUTH TWICE DAILY   risedronate 35 MG tablet Commonly known as: ACTONEL Take 35 mg by mouth every Sunday. Give 1 tablet by mouth one time a day every Sun for post menopause   Toujeo  Max SoloStar 300 UNIT/ML Solostar Pen Generic drug: insulin  glargine (2 Unit Dial) Inject 45 Units into the skin daily.   Toujeo  Max SoloStar 300 UNIT/ML Solostar Pen Generic drug: insulin  glargine (2 Unit Dial) ADMINISTER 35 UNITS UNDER THE SKIN DAILY        Review of Systems  Constitutional:  Negative for appetite change, fatigue and fever.  HENT:  Positive for hearing loss. Negative for congestion and trouble swallowing.   Eyes:  Negative for visual disturbance.  Respiratory:  Negative for cough, chest tightness and wheezing.   Cardiovascular:  Positive for leg swelling.  Gastrointestinal:  Negative for abdominal pain, blood in stool and diarrhea.  Genitourinary:  Negative for dysuria and urgency.  Musculoskeletal:  Positive for arthralgias, back pain and gait problem.       Right lower leg pain occasionally Chronic R+L knee pain, ambulates with walker, s/p inj  Skin:  Positive for wound.  Neurological:  Negative for tremors and headaches.  Psychiatric/Behavioral:  Negative for behavioral problems and sleep disturbance. The patient is not nervous/anxious.     Immunization History  Administered Date(s) Administered   INFLUENZA, HIGH DOSE SEASONAL PF 10/06/2016, 10/05/2017, 09/14/2018, 08/17/2022, 08/31/2023   Influenza, Quadrivalent, Recombinant, Inj, Pf 08/01/2020   Influenza,inj,Quad PF,6+ Mos 10/02/2014   Moderna Covid-19 Vaccine Bivalent Booster 50yrs & up 08/17/2023   PNEUMOCOCCAL CONJUGATE-20 03/16/2023   PPD Test 02/22/2023   Pneumococcal-Unspecified 03/29/2022   Tdap 09/29/2022, 09/06/2023   Unspecified SARS-COV-2 Vaccination 11/05/2019, 12/03/2019, 03/31/2021, 07/21/2021, 03/19/2022    Zoster Recombinant(Shingrix) 03/07/2023   Pertinent  Health Maintenance Due  Topic Date Due   OPHTHALMOLOGY EXAM  Never done   Influenza Vaccine  06/01/2024   HEMOGLOBIN A1C  09/27/2024   FOOT EXAM  04/18/2025   DEXA SCAN  Completed  05/19/2023    2:37 PM 06/02/2023    2:49 PM 06/09/2023    8:48 AM 07/08/2023   10:35 AM 07/29/2023    9:55 AM  Fall Risk  Falls in the past year? 1 1 1 1  0  Was there an injury with Fall? 1 1 1 1  0  Fall Risk Category Calculator 3 3 3 3  0  Patient at Risk for Falls Due to  Impaired balance/gait;History of fall(s) History of fall(s);Impaired balance/gait History of fall(s);Impaired balance/gait;Impaired mobility History of fall(s);Impaired balance/gait;Impaired mobility  Fall risk Follow up Falls evaluation completed Falls evaluation completed Falls evaluation completed Falls evaluation completed Falls evaluation completed   Functional Status Survey:    Vitals:   07/25/24 1341  BP: 130/72  Pulse: 74  Resp: 18  Temp: 97.7 F (36.5 C)  SpO2: 97%  Weight: 132 lb 3.2 oz (60 kg)   Body mass index is 22 kg/m. Physical Exam Vitals and nursing note reviewed.  Constitutional:      Appearance: Normal appearance.  HENT:     Head: Normocephalic and atraumatic.     Nose: Nose normal.     Mouth/Throat:     Mouth: Mucous membranes are moist.  Eyes:     Extraocular Movements: Extraocular movements intact.     Conjunctiva/sclera: Conjunctivae normal.     Pupils: Pupils are equal, round, and reactive to light.  Cardiovascular:     Rate and Rhythm: Normal rate and regular rhythm.     Heart sounds: No murmur heard.    Comments: Pacemaker left upper chest(defibrillator was removed per patient's request in the past) No dorsalis pedis pulses or posterior tibial pulses felt.  Pulmonary:     Effort: Pulmonary effort is normal.     Breath sounds: No rales.  Abdominal:     General: Bowel sounds are normal.     Palpations: Abdomen is soft.      Tenderness: There is no abdominal tenderness. There is no right CVA tenderness, left CVA tenderness, guarding or rebound.  Musculoskeletal:     Cervical back: Normal range of motion and neck supple.     Right lower leg: Edema present.     Left lower leg: Edema present.     Comments: Lower back, pelvic/thigh pain with movement/weight bearing.  Edema trace BLE R>L Pain in the lower right leg above the right ankle pain when walking, no redness, warmth, open wound, or deformity.   Skin:    General: Skin is warm and dry.     Findings: Erythema present.     Comments: S/p R breat cancer lumpectomy, no apparent edema R arm.  Open wound lateral R ankle where had surgery, mild erythema, warmth, tenderness in the area.     Neurological:     General: No focal deficit present.     Mental Status: She is alert and oriented to person, place, and time. Mental status is at baseline.     Gait: Gait abnormal.     Comments: Ambulates with walker.   Psychiatric:        Mood and Affect: Mood normal.        Behavior: Behavior normal.        Thought Content: Thought content normal.     Labs reviewed: Recent Labs    09/08/23 0615 09/09/23 0559 09/10/23 0530 09/27/23 0000 10/07/23 0000 01/20/24 1346 02/23/24 0000  NA 140 141 138   < > 142 137 140  K 3.3* 4.3 3.8   < >  4.2 3.7 4.1  CL 106 108 102   < > 105 99 105  CO2 24 25 25    < > 28* 26 23*  GLUCOSE 199* 143* 181*  --   --  283*  --   BUN 25* 23 22   < > 17 22 21   CREATININE 0.93 0.94 0.92   < > 0.9 0.85 0.9  CALCIUM  8.5* 8.7* 8.3*   < > 8.5* 9.0 9.0  MG 2.0 2.1 2.2  --   --   --   --    < > = values in this interval not displayed.   Recent Labs    09/27/23 0000 09/28/23 0000 10/07/23 0000  AST  --  15 20  ALT  --  16 16  ALKPHOS  --  147* 103  ALBUMIN 3.3*  --  3.3*   Recent Labs    09/09/23 0559 09/10/23 0530 09/28/23 0000 10/07/23 0000 01/20/24 1346  WBC 11.9* 8.1 4.9 4.6 6.8  NEUTROABS  --   --  3,244.00 2,838.00 5.6   HGB 11.6* 10.5* 11.0* 11.6* 14.0  HCT 34.9* 31.9* 34* 35* 43.7  MCV 97.5 95.8  --   --  97.5  PLT 136* 119* 138* 145* 157   Lab Results  Component Value Date   TSH 1.93 02/25/2023   Lab Results  Component Value Date   HGBA1C 7.5 02/25/2023   Lab Results  Component Value Date   CHOL 116 02/25/2023   HDL 35 02/25/2023   LDLCALC 58 02/25/2023   TRIG 144 02/25/2023   CHOLHDL 2.7 11/10/2020    Significant Diagnostic Results in last 30 days:  No results found.  Assessment/Plan: Open wound of right ankle   reopened wound surgical site lateral right ankle, serous drainage, periwound redness, warmth, and tenderness.   R ankle fx, healed, last f/u Ortho 11/01/23 WBAT, mild swelling RLE  Type 2 diabetes mellitus with renal complication (HCC) on Toujeo  to 35u every day,  Insulin  Aspart 10u if CBG > 401-500, 3u with meals-hold <70, Hgb A1c 7.5 02/25/23<<9.3 10/06/23>>8.0 03/27/24  IBS (irritable bowel syndrome) Sigmoid diverticulitis/IBS taking Bentyl , Imodium , on and off diarrhea  SYSTOLIC HEART FAILURE, CHRONIC Compensated clinically, taking Furosemide , Bun/creat 21/0.9 02/23/24    Family/ staff Communication: Plan of care reviewed with the patient and charge nurse  Labs/tests ordered: None

## 2024-07-25 NOTE — Assessment & Plan Note (Signed)
 Compensated clinically, taking Furosemide , Bun/creat 21/0.9 02/23/24

## 2024-07-25 NOTE — Assessment & Plan Note (Signed)
 on Toujeo  to 35u every day,  Insulin  Aspart 10u if CBG > 401-500, 3u with meals-hold <70, Hgb A1c 7.5 02/25/23<<9.3 10/06/23>>8.0 03/27/24

## 2024-07-25 NOTE — Assessment & Plan Note (Signed)
Blood pressure is controlled,  taking Metoprolol, Olmesartan 

## 2024-07-25 NOTE — Assessment & Plan Note (Signed)
 Sigmoid diverticulitis/IBS taking Bentyl , Imodium , on and off diarrhea

## 2024-07-30 ENCOUNTER — Non-Acute Institutional Stay: Payer: Self-pay | Admitting: Sports Medicine

## 2024-07-30 ENCOUNTER — Encounter: Payer: Self-pay | Admitting: Sports Medicine

## 2024-07-30 DIAGNOSIS — R300 Dysuria: Secondary | ICD-10-CM

## 2024-07-30 DIAGNOSIS — E11621 Type 2 diabetes mellitus with foot ulcer: Secondary | ICD-10-CM

## 2024-07-30 DIAGNOSIS — R3581 Nocturnal polyuria: Secondary | ICD-10-CM | POA: Diagnosis not present

## 2024-07-30 DIAGNOSIS — Z794 Long term (current) use of insulin: Secondary | ICD-10-CM

## 2024-07-30 DIAGNOSIS — S91001D Unspecified open wound, right ankle, subsequent encounter: Secondary | ICD-10-CM

## 2024-07-30 DIAGNOSIS — L97509 Non-pressure chronic ulcer of other part of unspecified foot with unspecified severity: Secondary | ICD-10-CM

## 2024-07-30 DIAGNOSIS — I5022 Chronic systolic (congestive) heart failure: Secondary | ICD-10-CM

## 2024-07-30 DIAGNOSIS — I10 Essential (primary) hypertension: Secondary | ICD-10-CM | POA: Diagnosis not present

## 2024-07-30 DIAGNOSIS — M545 Low back pain, unspecified: Secondary | ICD-10-CM | POA: Diagnosis not present

## 2024-07-30 NOTE — Progress Notes (Signed)
 Provider:  Dr. Jackalyn Blazing Location:  [facility] Place of Service:   Assisted living   PCP: Mast, Man X, NP Patient Care Team: Mast, Man X, NP as PCP - General (Internal Medicine) Fernande Elspeth BROCKS, MD as PCP - Electrophysiology (Cardiology) Vernetta Berg, MD as Consulting Physician (General Surgery) Loretha Ash, MD as Consulting Physician (Hematology and Oncology) Dewey Rush, MD as Consulting Physician (Radiation Oncology)  Extended Emergency Contact Information Primary Emergency Contact: Drury Fairy BRAVO Address: [address]          [place], [place] [address] [place] Home Phone: [phone] Mobile Phone: [phone] Relation: Son  Goals of Care: Advanced Directive information    03/22/2024   11:23 AM  Advanced Directives  Does Patient Have a Medical Advance Directive? Yes  Type of Advance Directive Out of facility DNR (pink MOST or yellow form)  Does patient want to make changes to medical advance directive? No - Patient declined        History of Present Illness   89 year old female with a past medical history of diabetes, hyperlipidemia, hypertension, CHF, history of breast cancer is seen today for chronic disease management. Patient seen and examined in her room.  Patient complains of dysuria, urinary frequency since 3 to 4 days. Patient denies abdominal pain, fevers, chills, nausea, vomiting. Complains of lower back pain since last 3 to 4 days.  Chronic wound on her rt ankle As per nursing staff wound heals and reopens Patient states his pain was drained serous drainage since couple of days Denies fevers, chills, pain Patient has an appointment to see Ortho tomorrow  History of diabetes Patient is currently on Toujeo , sliding scale insulin  Reviewed blood sugar log no hypoglycemia   History of CHF Patient complains of exertional shortness of breath, denies orthopnea, PND.  She sleeps on 1 pillow Patient is  able to speak in full sentences, does not appear to be in distress Currently on Lasix  40 mg daily  Hypertension Blood pressure on the lower side 100/60 She is currently on hydralazine , metoprolol , olmesartan     Past Medical History:  Diagnosis Date   Biventricular ICD (implantable cardiac defibrillator) in place    downgraded from CRT-D to CRT-P 08/30/22   Breast cancer (HCC)    Cellulitis    Change in bowel habits    CHF (congestive heart failure) (HCC)    Colon polyps    Diabetes mellitus    Type II   Diarrhea    Dyslipidemia    HOH (hard of hearing)    left   HTN (hypertension)    LBBB (left bundle branch block)    chronic   Meniere's disease    Nonischemic cardiomyopathy (HCC) 2006   Presence of permanent cardiac pacemaker    T wave oversensing 01/17/2014   Ulcerative colitis    Varicose veins    4 lazer  treatment each leg   Past Surgical History:  Procedure Laterality Date   ABDOMINAL HYSTERECTOMY     BIV PACEMAKER GENERATOR CHANGEOUT N/A 08/30/2022   Procedure: BIV PACEMAKER GENERATOR CHANGEOUT;  Surgeon: Fernande Elspeth BROCKS, MD;  Location: [facility] INVASIVE CV LAB;  Service: Cardiovascular;  Laterality: N/A;   BREAST LUMPECTOMY WITH RADIOACTIVE SEED LOCALIZATION Right 01/05/2023   Procedure: RIGHT BREAST BRACKETED LUMPECTOMY WITH RADIOACTIVE SEED LOCALIZATION;  Surgeon: Vernetta Berg, MD;  Location: [facility] OR;  Service: General;  Laterality: Right;   CARDIAC CATHETERIZATION  08/2011   CAST APPLICATION Right 09/06/2023   Procedure: CAST APPLICATION;  Surgeon: Jerri Kay HERO, MD;  Location: [facility] OR;  Service: Orthopedics;  Laterality: Right;   CHOLECYSTECTOMY     COLONOSCOPY     EP Study  08/22/08   I & D EXTREMITY Right 09/06/2023   Procedure: IRRIGATION AND DEBRIDEMENT RIGHT ANKLE;  Surgeon: Jerri Kay HERO, MD;  Location: [facility] OR;  Service: Orthopedics;  Laterality: Right;   IMPLANTABLE CARDIOVERTER DEFIBRILLATOR GENERATOR CHANGE N/A 08/22/2013   Procedure: IMPLANTABLE  CARDIOVERTER DEFIBRILLATOR GENERATOR CHANGE;  Surgeon: Elspeth JAYSON Sage, MD;  Location: [facility] CATH LAB;  Service: Cardiovascular;  Laterality: N/A;   KNEE ARTHROSCOPY Left 04/14/2016   Procedure: LEFT KNEE ARTHROSCOPY, PARTIAL MEDIAL AND PARTIAL LATERAL MENISCECTOMY, MEDIAL PLICA;  Surgeon: Norleen Gavel, MD;  Location: [facility] OR;  Service: Orthopedics;  Laterality: Left;   mastoid sugery     in the setting of Meniere's disease   ORIF ANKLE FRACTURE Right 09/09/2023   Procedure: OPEN REDUCTION INTERNAL FIXATION (ORIF) ANKLE FRACTURE;  Surgeon: Harden Jerona GAILS, MD;  Location: [facility] OR;  Service: Orthopedics;  Laterality: Right;   TONSILLECTOMY AND ADENOIDECTOMY      reports that she has quit smoking. Her smoking use included cigarettes. She has never been exposed to tobacco smoke. She has never used smokeless tobacco. She reports that she does not drink alcohol and does not use drugs. 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    Social Drivers of Corporate investment banker Strain: Low Risk  (12/08/2022)   Overall Financial Resource Strain (CARDIA)    Difficulty of Paying Living Expenses: Not hard at all  Food Insecurity: No Food Insecurity (09/13/2023)   Hunger Vital Sign    Worried About Running Out of Food in the Last Year: Never  true    Ran Out of Food in the Last Year: Never true  Transportation Needs: No Transportation Needs (09/13/2023)   PRAPARE - Administrator, Civil Service (Medical): No    Lack of Transportation (Non-Medical): No  Physical Activity: Not on file  Stress: Not on file  Social Connections: Not on file  Intimate Partner Violence: Not At Risk (09/13/2023)   Humiliation, Afraid, Rape, and Kick questionnaire    Fear of Current or Ex-Partner: No    Emotionally Abused: No    Physically Abused: No    Sexually Abused: No    Functional Status Survey:    Family History  Problem Relation Age of Onset   Breast cancer Mother 67       met to lungs   Emphysema Father 55   Heart disease Father    Healthy Child    Lung cancer Cousin  maternal first cousin, she smoked   Colon cancer Neg Hx    Rectal cancer Neg Hx    Stomach cancer Neg Hx    Liver cancer Neg Hx    Esophageal cancer Neg Hx     Health Maintenance  Topic Date Due   OPHTHALMOLOGY EXAM  Never done   Zoster Vaccines- Shingrix (2 of 2) 05/02/2023   Influenza Vaccine  06/01/2024   COVID-19 Vaccine (7 - 2025-26 season) 07/02/2024   HEMOGLOBIN A1C  09/27/2024   FOOT EXAM  04/18/2025   Medicare Annual Wellness (AWV)  04/18/2025   DTaP/Tdap/Td (3 - Td or Tdap) 09/05/2033   Pneumococcal Vaccine: 50+ Years  Completed   DEXA SCAN  Completed   HPV VACCINES  Aged Out   Meningococcal B Vaccine  Aged Out    Allergies  Allergen Reactions   Coreg [Carvedilol] Other (See Comments)    Unknown reaction   Firvanq [Vancomycin] Itching and Other (See Comments)    Red Man Syndrome   Lactose Intolerance (Gi)    Lanoxin [Digoxin] Other (See Comments)    DIG toxicity   Sudafed [Pseudoephedrine] Other (See Comments)    Makes skin feel like its crawling     Outpatient Encounter Medications as of 07/30/2024  Medication Sig   acetaminophen  (TYLENOL ) 500 MG tablet Take 500 mg by mouth every 8 (eight) hours as needed for  headache.   aspirin  EC 81 MG tablet Take 81 mg by mouth daily.   aspirin  EC 81 MG tablet Take 1 tablet (81 mg total) by mouth daily. Swallow whole. (Patient not taking: Reported on 03/22/2024)   atorvastatin  (LIPITOR) 40 MG tablet TAKE 1 TABLET(40 MG) BY MOUTH DAILY   diclofenac  Sodium (VOLTAREN  ARTHRITIS PAIN) 1 % GEL Apply 2 g topically 4 (four) times daily. Apply to R ankle topically four times a day for Reddened areas APPLY AWAY FROM OPENED AREAS (Patient not taking: Reported on 03/22/2024)   diclofenac  Sodium (VOLTAREN  ARTHRITIS PAIN) 1 % GEL Apply topically every 8 (eight) hours as needed (Apply to bilateral knees).   furosemide  (LASIX ) 20 MG tablet TAKE 1 TABLET BY MOUTH DAILY AS DIRECTED, ALTERNATING 40MG  EVERY OTHER DAY (Patient not taking: Reported on 03/22/2024)   furosemide  (LASIX ) 40 MG tablet Take 40 mg by mouth 2 (two) times daily.   hydrALAZINE  (APRESOLINE ) 50 MG tablet Take 50 mg by mouth 2 (two) times daily.   INSULIN  ASPART IJ Inject 10 Units into the skin 3 (three) times daily before meals. Inject as per sliding scale: if 0 - 400 = 0 if below 400 do not give insulin ; 401 - 500 = 10 Give 10 units for CBG 401-500. If CBG is above 500 notify MD/NP., subcutaneously before meals and at bedtime   insulin  glargine, 2 Unit Dial, (TOUJEO  MAX SOLOSTAR) 300 UNIT/ML Solostar Pen Inject 45 Units into the skin daily.   loperamide  (IMODIUM  A-D) 2 MG tablet Take 2 mg by mouth every 6 (six) hours as needed for diarrhea or loose stools.   metoprolol  succinate (TOPROL -XL) 100 MG 24 hr tablet TAKE 1 TABLET(100 MG) BY MOUTH TWICE DAILY   olmesartan  (BENICAR ) 40 MG tablet TAKE 1 TABLET(40 MG) BY MOUTH DAILY   ondansetron  (ZOFRAN ) 4 MG tablet Take 4 mg by mouth every 6 (six) hours as needed.   polyethylene glycol (MIRALAX  / GLYCOLAX ) 17 g packet Take 17 g by mouth daily as needed for moderate constipation.   potassium chloride  (KLOR-CON  M) 10 MEQ tablet TAKE 1 TABLET(10 MEQ)  BY MOUTH TWICE DAILY (Patient  not taking: Reported on 03/22/2024)   potassium chloride  (KLOR-CON ) 10 MEQ tablet Take 20 mEq by mouth 2 (two) times daily.   risedronate (ACTONEL) 35 MG tablet Take 35 mg by mouth every Sunday. Give 1 tablet by mouth one time a day every Sun for post menopause   TOUJEO  MAX SOLOSTAR 300 UNIT/ML Solostar Pen ADMINISTER 35 UNITS UNDER THE SKIN DAILY (Patient not taking: Reported on 03/22/2024)   No facility-administered encounter medications on file as of 07/30/2024.    Review of Systems  Constitutional:  Negative for chills and fever.  Respiratory:  Positive for shortness of breath (exertional). Negative for cough and wheezing.   Cardiovascular:  Negative for chest pain, palpitations and leg swelling.  Gastrointestinal:  Negative for abdominal pain, blood in stool, constipation, diarrhea, nausea and vomiting.  Genitourinary:  Positive for dysuria and frequency.  Musculoskeletal:  Positive for back pain.  Neurological:  Negative for dizziness.   Negative unless indicated in HPI.  There were no vitals filed for this visit. There is no height or weight on file to calculate BMI. BP Readings from Last 3 Encounters:  07/25/24 130/72  07/20/24 107/64  04/18/24 (!) 155/77   Wt Readings from Last 3 Encounters:  07/25/24 132 lb 3.2 oz (60 kg)  04/18/24 129 lb 6.4 oz (58.7 kg)  04/17/24 131 lb 8 oz (59.6 kg)   Physical Exam Constitutional:      Appearance: Normal appearance.  HENT:     Head: Normocephalic and atraumatic.  Cardiovascular:     Rate and Rhythm: Normal rate and regular rhythm.  Pulmonary:     Effort: Pulmonary effort is normal. No respiratory distress.     Breath sounds: Normal breath sounds. No wheezing.  Abdominal:     General: Bowel sounds are normal. There is no distension.     Tenderness: There is no abdominal tenderness. There is no guarding or rebound.     Comments:    Musculoskeletal:        General: No swelling or tenderness.  Skin:    Comments: Rt ankle - 1cm  open wound , draining slight purulent drainage  No surrounding erythema No tenderness No warmth   Neurological:     Mental Status: She is alert. Mental status is at baseline.     Motor: No weakness.     Labs reviewed: Basic Metabolic Panel: Recent Labs    09/08/23 0615 09/09/23 0559 09/10/23 0530 09/27/23 0000 10/07/23 0000 01/20/24 1346 02/23/24 0000  NA 140 141 138   < > 142 137 140  K 3.3* 4.3 3.8   < > 4.2 3.7 4.1  CL 106 108 102   < > 105 99 105  CO2 24 25 25    < > 28* 26 23*  GLUCOSE 199* 143* 181*  --   --  283*  --   BUN 25* 23 22   < > 17 22 21   CREATININE 0.93 0.94 0.92   < > 0.9 0.85 0.9  CALCIUM  8.5* 8.7* 8.3*   < > 8.5* 9.0 9.0  MG 2.0 2.1 2.2  --   --   --   --    < > = values in this interval not displayed.   Liver Function Tests: Recent Labs    09/27/23 0000 09/28/23 0000 10/07/23 0000  AST  --  15 20  ALT  --  16 16  ALKPHOS  --  147* 103  ALBUMIN 3.3*  --  3.3*   No results for input(s): LIPASE, AMYLASE in the last 8760 hours. No results for input(s): AMMONIA in the last 8760 hours. CBC: Recent Labs    09/09/23 0559 09/10/23 0530 09/28/23 0000 10/07/23 0000 01/20/24 1346  WBC 11.9* 8.1 4.9 4.6 6.8  NEUTROABS  --   --  3,244.00 2,838.00 5.6  HGB 11.6* 10.5* 11.0* 11.6* 14.0  HCT 34.9* 31.9* 34* 35* 43.7  MCV 97.5 95.8  --   --  97.5  PLT 136* 119* 138* 145* 157   Cardiac Enzymes: No results for input(s): CKTOTAL, CKMB, CKMBINDEX, TROPONINI in the last 8760 hours. BNP: Invalid input(s): POCBNP Lab Results  Component Value Date   HGBA1C 7.5 02/25/2023   Lab Results  Component Value Date   TSH 1.93 02/25/2023   Lab Results  Component Value Date   VITAMINB12 414 08/22/2023   Lab Results  Component Value Date   FOLATE 19.7 03/02/2022   No results found for: IRON, TIBC, FERRITIN  Imaging and Procedures obtained prior to SNF admission: CT Head Wo Contrast Result Date: 01/20/2024 CLINICAL DATA:   Headache, hypertension. EXAM: CT HEAD WITHOUT CONTRAST TECHNIQUE: Contiguous axial images were obtained from the base of the skull through the vertex without intravenous contrast. RADIATION DOSE REDUCTION: This exam was performed according to the departmental dose-optimization program which includes automated exposure control, adjustment of the mA and/or kV according to patient size and/or use of iterative reconstruction technique. COMPARISON:  CT head 09/29/2022. FINDINGS: Brain: No acute intracranial hemorrhage. No CT evidence of acute infarct. Nonspecific hypoattenuation in the periventricular and subcortical white matter favored to reflect chronic microvascular ischemic changes. No edema, mass effect, or midline shift. The basilar cisterns are patent. Ventricles: Prominence of the ventricles suggesting underlying parenchymal volume loss. Vascular: Atherosclerotic calcifications of the carotid siphons. No hyperdense vessel. Skull: No acute or aggressive finding. Orbits: Orbits are symmetric. Sinuses: The visualized paranasal sinuses are clear. Other: Postsurgical changes of left canal wall up mastoidectomy. Mastoid air cells are clear. IMPRESSION: No CT evidence of acute intracranial abnormality. Similar chronic microvascular ischemic changes and parenchymal volume loss. Electronically Signed   By: Donnice Mania M.D.   On: 01/20/2024 11:55    Assessment and Plan Assessment & Plan   1. Dysuria (Primary) Patient complains of dysuria, lower back pain since 3 to 4 days She is afebrile Will check UA, urine culture  2. Open wound of right ankle, subsequent encounter Open wound on her right ankle about 1 cm draining slight purulent drainage Follow-up with Ortho Ortho tomorrow Continue with the daily dressing changes   3. Primary hypertension Blood pressure on the lower side She is currently on metoprolol  extended release 100 mg twice daily, will switch to 100 mg daily Continue with hydralazine ,  olmesartan  Monitor blood pressure daily for a week  4. SYSTOLIC HEART FAILURE, CHRONIC Lungs clear to auscultation Patient seems comfortable and does not appear to be in distress Continue with torsemide  5. Type 2 diabetes mellitus with foot ulcer, with long-term current use of insulin  (HCC) Will check A1c Continue with Toujeo , sliding scale insulin  Monitor for hypoglycemia.

## 2024-07-31 ENCOUNTER — Encounter (HOSPITAL_COMMUNITY): Payer: Self-pay | Admitting: Orthopedic Surgery

## 2024-07-31 ENCOUNTER — Encounter: Payer: Self-pay | Admitting: Cardiology

## 2024-07-31 ENCOUNTER — Ambulatory Visit: Admitting: Physician Assistant

## 2024-07-31 ENCOUNTER — Other Ambulatory Visit: Payer: Self-pay

## 2024-07-31 ENCOUNTER — Encounter: Payer: Self-pay | Admitting: Physician Assistant

## 2024-07-31 DIAGNOSIS — L03119 Cellulitis of unspecified part of limb: Secondary | ICD-10-CM

## 2024-07-31 DIAGNOSIS — E119 Type 2 diabetes mellitus without complications: Secondary | ICD-10-CM | POA: Diagnosis not present

## 2024-07-31 DIAGNOSIS — M545 Low back pain, unspecified: Secondary | ICD-10-CM | POA: Diagnosis not present

## 2024-07-31 NOTE — Anesthesia Preprocedure Evaluation (Signed)
 Anesthesia Evaluation  Patient identified by MRN, date of birth, ID band Patient awake    Reviewed: Allergy & Precautions, NPO status , Patient's Chart, lab work & pertinent test results, reviewed documented beta blocker date and time   History of Anesthesia Complications Negative for: history of anesthetic complications  Airway Mallampati: III  TM Distance: >3 FB     Dental no notable dental hx.    Pulmonary neg COPD, former smoker   breath sounds clear to auscultation       Cardiovascular hypertension, (-) angina +CHF  (-) CAD and (-) Past MI + dysrhythmias Atrial Fibrillation + pacemaker  Rhythm:Regular Rate:Normal  Medtronic BiV PPM 08/07/02 for HFrEF, explanted with insertion of ICD 12/28/04 for NICM, incorporation of RV lead into ICD 12/27/05; ICD generator replacement & lead repair 08/22/13; CRT-D at EOL, s/p generator replacement with removal of high voltage and replacement of low voltage Medtronic [id number] Percepta CRT-P 08/30/22  No recent TTE     Neuro/Psych neg Seizures    GI/Hepatic PUD,,,(+) neg Cirrhosis        Endo/Other  diabetes, Type 2    Renal/GU Renal disease     Musculoskeletal  (+) Arthritis ,    Abdominal   Peds  Hematology   Anesthesia Other Findings   Reproductive/Obstetrics                              Anesthesia Physical Anesthesia Plan  ASA: 3  Anesthesia Plan: General   Post-op Pain Management:    Induction: Intravenous  PONV Risk Score and Plan: 2 and Ondansetron   Airway Management Planned: LMA  Additional Equipment:   Intra-op Plan:   Post-operative Plan: Extubation in OR  Informed Consent: I have reviewed the patients History and Physical, chart, labs and discussed the procedure including the risks, benefits and alternatives for the proposed anesthesia with the patient or authorized representative who has indicated his/her understanding and  acceptance.     Dental advisory given  Plan Discussed with: CRNA  Anesthesia Plan Comments: (PAT note written 07/31/2024 by Brian Kocourek, PA-C.  )         Anesthesia Quick Evaluation

## 2024-07-31 NOTE — Progress Notes (Signed)
 SDW call  Patient is a resident at [facility], [phone], Tillman RN was given pre-op instructions over the phone. She verbalized understanding of instructions provided. Instructions were also faxed to [phone].  Patients son, will pick her up at the nursing home and transport her to the hospital   PCP - Man Mast, NP Cardiologist - Dr. Soyla Norton Pulmonary:    PPM/ICD - ICD, medtronic Device Orders - received and on chart Rep Notified - na   Chest x-ray - na EKG -  12/05/2023 Stress Test -02/18/2009 ECHO - 07/10/2014 Cardiac Cath -   Sleep Study/sleep apnea/CPAP: denies  Type II diabetic. A1C 7.5 on 02/25/2023.  Nursing home states they have not needed to treat her her with insulin  for high blood sugar in a long time. When pressed what her sugar reading this morning was, she states 501. She was unsure if insulin  was given and if not, why it was not.  Fasting Blood sugar range: they are unsure How often check sugars. TID   Blood Thinner Instructions:  denies Aspirin  Instructions: on hold   ERAS Protcol - Clears until 0920   Anesthesia review: Yes. Cardiomyopathy, HLD, CHF, HTN, DM, ICD

## 2024-07-31 NOTE — H&P (View-Only) (Signed)
 Office Visit Note   Patient: Grace Robinson           Date of Birth: 25-Dec-1925           MRN: [id number] Visit Date: 07/31/2024              Requested by: Mast, Man X, NP [address] [place],  [place] [address] PCP: Mast, Man X, NP  Chief Complaint  Patient presents with   Right Ankle - Wound Check      HPI: Chief Complaint: Infected right ankle with retained hardware HPI: 89 y/o female who sustained open fracture of the right ankle on 09/06/23.  The wound was irrigated and then the next day Dr. Harden performed ORIF of the right ankle.  She has done well over all, until 7-10 days ago she has developed erythema and edema with a 0.5 cm open draining wound over the lateral right ankle.  She denies fever and chills.      Assessment & Plan: Visit Diagnoses:  1. Cellulitis of ankle     Plan:  Right ankle infection with retained hardware.   Plan removal of right ankle hard ware irrigation and debridement of the ankle.  Plan for application of antibiotics and cultures for oral antibiotics.  WBAT in post op shoe with Prevena vac for 1 week.   Follow-Up Instructions: Return in about 1 day (around 08/01/2024) for surgery.   Ortho Exam  Patient is alert, oriented, no adenopathy, well-dressed, normal affect, normal respiratory effort. Right lateral ankle 0.5 cm open draining sinus.  Probed with a q tip to hardware.  Cellulitis in the surrounding tissue.  Tenderness lateral lower leg to the ankle with mild edema.      Imaging: Maintained hardware later plate and screws with well healed right ankle fracture.   Labs: Lab Results  Component Value Date   HGBA1C 7.5 02/25/2023   HGBA1C 7.5 (H) 12/31/2022   HGBA1C 7.4 (H) 11/10/2020   ESRSEDRATE 48 (H) 09/06/2016   CRP 0.1 (L) 09/06/2016     Lab Results  Component Value Date   ALBUMIN 3.3 (A) 10/07/2023   ALBUMIN 3.3 (A) 09/27/2023   ALBUMIN 3.8 02/25/2023    Lab Results  Component Value Date   MG 2.2 09/10/2023   MG 2.1  09/09/2023   MG 2.0 09/08/2023   Lab Results  Component Value Date   VD25OH 41 02/25/2023    No results found for: PREALBUMIN    Latest Ref Rng & Units 01/20/2024    1:46 PM 10/07/2023   12:00 AM 09/28/2023   12:00 AM  CBC EXTENDED  WBC 4.0 - 10.5 K/uL 6.8  4.6     4.9      RBC 3.87 - 5.11 MIL/uL 4.48  3.57     3.44      Hemoglobin 12.0 - 15.0 g/dL 85.9  88.3     88.9      HCT 36.0 - 46.0 % 43.7  35     34      Platelets 150 - 400 K/uL 157  145     138      NEUT# 1.7 - 7.7 K/uL 5.6  2,838.00     3,244.00      Lymph# 0.7 - 4.0 K/uL 0.6        This result is from an external source.     There is no height or weight on file to calculate BMI.  Orders:  Orders Placed This  Encounter  Procedures   XR Ankle Complete Right   No orders of the defined types were placed in this encounter.    Procedures: No procedures performed  Clinical Data: No additional findings.  ROS:  All other systems negative, except as noted in the HPI. Review of Systems  Objective: Vital Signs: There were no vitals taken for this visit.  Specialty Comments:  No specialty comments available.  PMFS History: Patient Active Problem List   Diagnosis Date Noted   Numbness of right hand 11/03/2023   Right calf pain 10/10/2023   Cellulitis 09/26/2023   IBS (irritable bowel syndrome) 09/10/2023   Open wound of right ankle 09/09/2023   Hypokalemia 09/08/2023   Dislocation of ankle, right, open, initial encounter 09/07/2023   Open fracture of right distal fibula 09/06/2023   COVID-19 virus infection 06/09/2023   Gait abnormality 05/19/2023   Osteoarthritis 05/03/2023   Radiation dermatitis 05/03/2023   Osteopenia after menopause 03/04/2023   Closed pelvic fracture (HCC) 02/23/2023   Lower back pain 02/23/2023   Hydronephrosis 02/23/2023   Sigmoid diverticulitis 02/23/2023   Aortic atherosclerosis 02/23/2023   Hyperlipidemia associated with type 2 diabetes mellitus (HCC) 02/23/2023    Vitamin B12 deficiency 02/23/2023   Vitamin D  deficiency 02/23/2023   Type 2 diabetes mellitus with renal complication (HCC) 02/23/2023   S/P lumpectomy, right breast 01/05/2023   Ductal carcinoma in situ (DCIS) of right breast 12/06/2022   NICM (nonischemic cardiomyopathy) (HCC) 07/30/2021   Degenerative tear of posterior horn of medial meniscus of left knee 04/14/2016   Tear of lateral meniscus of left knee 04/14/2016   Synovial plica of left knee 04/14/2016   Mechanical complication of implantable cardioverter-defibrillator (ICD) 01/17/2014   Fall 07/10/2013   Paroxysmal ventricular tachycardia (HCC) 12/13/2012   Biventricular implantable cardioverter-defibrillator -Medtronic 12/08/2011   Atrial tachycardia 12/08/2011   HTN (hypertension) 05/19/2011   CARDIOMYOPATHY, PRIMARY, DILATED 12/01/2010   SYSTOLIC HEART FAILURE, CHRONIC 12/01/2010   Past Medical History:  Diagnosis Date   Biventricular ICD (implantable cardiac defibrillator) in place    downgraded from CRT-D to CRT-P 08/30/22   Breast cancer (HCC)    Cellulitis    Change in bowel habits    CHF (congestive heart failure) (HCC)    Colon polyps    Diabetes mellitus    Type II   Diarrhea    Dyslipidemia    HOH (hard of hearing)    left   HTN (hypertension)    LBBB (left bundle branch block)    chronic   Meniere's disease    Nonischemic cardiomyopathy (HCC) 2006   Presence of permanent cardiac pacemaker    T wave oversensing 01/17/2014   Ulcerative colitis    Varicose veins    4 lazer  treatment each leg    Family History  Problem Relation Age of Onset   Breast cancer Mother 58       met to lungs   Emphysema Father 30   Heart disease Father    Healthy Child    Lung cancer Cousin        maternal first cousin, she smoked   Colon cancer Neg Hx    Rectal cancer Neg Hx    Stomach cancer Neg Hx    Liver cancer Neg Hx    Esophageal cancer Neg Hx     Past Surgical History:  Procedure Laterality Date    ABDOMINAL HYSTERECTOMY     BIV PACEMAKER GENERATOR CHANGEOUT N/A 08/30/2022   Procedure: BIV PACEMAKER GENERATOR  CHANGEOUT;  Surgeon: Fernande Elspeth BROCKS, MD;  Location: [facility] INVASIVE CV LAB;  Service: Cardiovascular;  Laterality: N/A;   BREAST LUMPECTOMY WITH RADIOACTIVE SEED LOCALIZATION Right 01/05/2023   Procedure: RIGHT BREAST BRACKETED LUMPECTOMY WITH RADIOACTIVE SEED LOCALIZATION;  Surgeon: Vernetta Berg, MD;  Location: [facility] OR;  Service: General;  Laterality: Right;   CARDIAC CATHETERIZATION  08/2011   CAST APPLICATION Right 09/06/2023   Procedure: CAST APPLICATION;  Surgeon: Jerri Kay HERO, MD;  Location: [facility] OR;  Service: Orthopedics;  Laterality: Right;   CHOLECYSTECTOMY     COLONOSCOPY     EP Study  08/22/08   I & D EXTREMITY Right 09/06/2023   Procedure: IRRIGATION AND DEBRIDEMENT RIGHT ANKLE;  Surgeon: Jerri Kay HERO, MD;  Location: [facility] OR;  Service: Orthopedics;  Laterality: Right;   IMPLANTABLE CARDIOVERTER DEFIBRILLATOR GENERATOR CHANGE N/A 08/22/2013   Procedure: IMPLANTABLE CARDIOVERTER DEFIBRILLATOR GENERATOR CHANGE;  Surgeon: Elspeth BROCKS Fernande, MD;  Location: [facility] CATH LAB;  Service: Cardiovascular;  Laterality: N/A;   KNEE ARTHROSCOPY Left 04/14/2016   Procedure: LEFT KNEE ARTHROSCOPY, PARTIAL MEDIAL AND PARTIAL LATERAL MENISCECTOMY, MEDIAL PLICA;  Surgeon: Norleen Gavel, MD;  Location: [facility] OR;  Service: Orthopedics;  Laterality: Left;   mastoid sugery     in the setting of Meniere's disease   ORIF ANKLE FRACTURE Right 09/09/2023   Procedure: OPEN REDUCTION INTERNAL FIXATION (ORIF) ANKLE FRACTURE;  Surgeon: Harden Jerona GAILS, MD;  Location: [facility] OR;  Service: Orthopedics;  Laterality: Right;   TONSILLECTOMY AND ADENOIDECTOMY     Social History   Occupational History   Occupation: retired    Comment: Runner, 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

## 2024-07-31 NOTE — Progress Notes (Signed)
 PERIOPERATIVE PRESCRIPTION FOR IMPLANTED CARDIAC DEVICE PROGRAMMING  Patient Information: Name:  Grace Robinson  DOB:  10/09/1926  MRN:  [id number]    Planned Procedure:  Incision and drainage of deep abscess right calf  Surgeon:  Dr. Jerona Sage  Date of Procedure:  08/01/2024  Cautery will be used.  Position during surgery:  Possible prone   Please send documentation back to:  Jolynn Pack (Fax # [phone])  Device Information:  Clinic EP Physician:  Soyla Norton, MD   Device Type:  Pacemaker Manufacturer and Phone #:  Medtronic: [phone] Pacemaker Dependent?:  Unknown Date of Last Device Check:  06/17/2024  Normal Device Function?:  Yes.    Electrophysiologist's Recommendations:  Have magnet available. Provide continuous ECG monitoring when magnet is used or reprogramming is to be performed.  Procedure should not interfere with device function.  No device programming or magnet placement needed.  Per Device Clinic Standing Orders, Almarie ONEIDA Shutter, RN  4:30 PM 07/31/2024

## 2024-07-31 NOTE — Progress Notes (Signed)
 Anesthesia Chart Review: SAME DAY WORK-UP  Case: [id number] Date/Time: 08/01/24 1206   Procedures:      REMOVAL, HARDWARE (Right) - RIGHT ANKLE REMOVAL OF RETAINED HARDWARE     INCISION AND DRAINAGE OF DEEP ABSCESS, CALF (Right) - IRRIGATION AND DEBRIDEMENT RIGHT ANKLE   Anesthesia type: Choice   Diagnosis: Retained orthopedic hardware [Z96.9]   Pre-op diagnosis: Right Ankle Infection with Retained Hardware   [facility] RO[facility] / [facility]   Surgeons: Harden Jerona GAILS, MD       DISCUSSION:  Patient is a 89 year old female scheduled for the above procedure.    History includes former smoker, non-ischemic cardiomyopathy (diagnosed ~ 2003), left BBB, CHF, cardiac device (Medtronic BiV PPM 08/07/02 for HFrEF, explanted with insertion of ICD 12/28/04 for NICM, incorporation of RV lead into ICD 12/27/05; ICD generator replacement & lead repair 08/22/13; CRT-D at EOL, s/p generator replacement with removal of high voltage and replacement of low voltage Medtronic [id number] Percepta CRT-P 08/30/22); HTN, dyslipidemia, DM2, breast cancer (right breast DCIS 12/06/22, s/p right breast lumpectomy 01/05/23, s/p radiation), Mnire's disease, hard of hearing, carotid artery disease (< 50% right ICA, 50-69% LICA 03/2022), essential tremor, ulcerative colitis. Possible prior TIA (~ 2021), right open ankle fracture dislocation (s/p ORIF 09/09/2023).    She resides at [facility]. Evaluation by Sherlynn Madden, MD on 07/30/2024. UA, urine culture ordered for dysuria. Referred to ortho for purulent drainage from open wound on right ankle. Chronic systolic seems stable on diuretic therapy.   Last EP visit noted was on 12/05/2023 with Aniceto Jarvis, NP for routine follow-up. She was downgraded from CRT-D to CRT-P on 08/30/2022. Euvolemic then and felt stable on Toprol , olmesartan , Lasix . PPM interrogation then showed 97.9% BiV pacing, few second of NSVT, < 0.1% afib (not on anticoagulation due to low  burden). Six month follow-up planned.    Per EP Cardiac Device Perioperative Rx Standing Orders form: Device Information: Clinic EP Physician:  Soyla Norton, MD  Device Type:  Pacemaker Manufacturer and Phone #:  Medtronic: [phone] Pacemaker Dependent?:  Unknown Date of Last Device Check:  06/17/2024      Normal Device Function?:  Yes.     Electrophysiologist's Recommendations:  Have magnet available. Provide continuous ECG monitoring when magnet is used or reprogramming is to be performed.  Procedure should not interfere with device function.  No device programming or magnet placement needed.   Anesthesia team to evaluate on the day of surgery.    VS:  Wt Readings from Last 3 Encounters:  07/30/24 60 kg  07/25/24 60 kg  04/18/24 58.7 kg   BP Readings from Last 3 Encounters:  07/30/24 100/60  07/25/24 130/72  07/20/24 107/64   Pulse Readings from Last 3 Encounters:  07/30/24 74  07/25/24 74  07/20/24 74     PROVIDERS: Mast, Man X, NP is PCP ([facility]). Previously was seeing Onita Rush, MD with [facility].  [facility] Fernande Standing, MD is EP cardiologist - Loretha Ash, MD is HEM-ON[facility] Dewey Rush, MD is RAD-[facility] Evonnie Stabs, DO is neurologist. Last visit 08/16/2022 for essential tremor, carotid stenosis, ataxia/suspected diabetic peripheral neuropathy.  - Eliza Bruckner, MD is vascular surgeon (now retired). Last visit 04/29/2022 for moderate LICA stenosis.    LABS: Most recent results in [facility] include: Lab Results  Component Value Date   WBC 6.8 01/20/2024   HGB 14.0 01/20/2024   HCT 43.7 01/20/2024   PLT 157 01/20/2024  GLUCOSE 283 (H) 01/20/2024   CHOL 116 02/25/2023   TRIG 144 02/25/2023   HDL 35 02/25/2023   LDLCALC 58 02/25/2023   ALT 16 10/07/2023   AST 20 10/07/2023   NA 140 02/23/2024   K 4.1 02/23/2024   CL 105 02/23/2024   CREATININE 0.9 02/23/2024   BUN 21 02/23/2024   CO2 23 (A) 02/23/2024   TSH 1.93  02/25/2023   INR 1.1 (H) 08/13/2013   HGBA1C 7.5 02/25/2023     EKG: 12/05/2023: AV dual-paced rhythm Confirmed by Aniceto Jarvis ([id number]) on 12/05/2023 7:08:28 PM   CV: US  Carotid 03/30/22: IMPRESSION: 1. Right carotid artery system: Less than 50% stenosis secondary to moderate multifocal atherosclerotic plaque formation. 2. Left carotid artery system: 50-69% stenosis secondary to focal atherosclerotic plaque formation in the proximal to mid internal carotid artery. 3.  Vertebral artery system: Patent with antegrade flow bilaterally.     Echo 07/10/14: Study Conclusions  - Left ventricle: The cavity size was normal. Systolic function was    normal. The estimated ejection fraction was in the range of 50%    to 55%. There was an increased relative contribution of atrial    contraction to ventricular filling. Doppler parameters are    consistent with abnormal left ventricular relaxation (grade 1    diastolic dysfunction).  - Ventricular septum: Septal motion showed paradox. These changes    are consistent with right ventricular pacing.  - Aortic valve: There was mild regurgitation.  - Mitral valve: There is a calcified mobile density in the LV    cavity most consistent with calcified redundant chordae tendinae.    This was present on echo in 2014. There was mild regurgitation.  - Pulmonic valve: There was trivial regurgitation.  - Comparison: LVEF 60% 12/20/12; 25-35% 12/10/02, 30-40% 12/06/02     Normal nuclear stress study 02/18/09.     Cardiac cath 11/11/04: OVERALL IMPRESSION:  1.  Severe left ventricular dysfunction with global hypokinesia with grossly-      appearing satisfactory resynchronization.  2.  Minimal coronary atherosclerosis with distal vessel disease in a small      branch of the distal right coronary artery, 20-30% narrowing in a high      obtuse marginal, and 20-30% narrowing in a second diagonal vessel.  DISCUSSION:  These findings are all consistent with a  nonischemic cardiomyopathy.  We will proceed on with ICD implantation.   Past Medical History:  Diagnosis Date   Biventricular ICD (implantable cardiac defibrillator) in place    downgraded from CRT-D to CRT-P 08/30/22   Breast cancer (HCC)    Cellulitis    Change in bowel habits    CHF (congestive heart failure) (HCC)    Colon polyps    Diabetes mellitus    Type II   Diarrhea    Dyslipidemia    HOH (hard of hearing)    left   HTN (hypertension)    LBBB (left bundle branch block)    chronic   Meniere's disease    Nonischemic cardiomyopathy (HCC) 2006   Presence of permanent cardiac pacemaker    T wave oversensing 01/17/2014   Ulcerative colitis    Varicose veins    4 lazer  treatment each leg    Past Surgical History:  Procedure Laterality Date   ABDOMINAL HYSTERECTOMY     BIV PACEMAKER GENERATOR CHANGEOUT N/A 08/30/2022   Procedure: BIV PACEMAKER GENERATOR CHANGEOUT;  Surgeon: Fernande Elspeth BROCKS, MD;  Location: [facility] INVASIVE CV LAB;  Service: Cardiovascular;  Laterality: N/A;   BREAST LUMPECTOMY WITH RADIOACTIVE SEED LOCALIZATION Right 01/05/2023   Procedure: RIGHT BREAST BRACKETED LUMPECTOMY WITH RADIOACTIVE SEED LOCALIZATION;  Surgeon: Vernetta Berg, MD;  Location: [facility] OR;  Service: General;  Laterality: Right;   CARDIAC CATHETERIZATION  08/2011   CAST APPLICATION Right 09/06/2023   Procedure: CAST APPLICATION;  Surgeon: Jerri Kay HERO, MD;  Location: [facility] OR;  Service: Orthopedics;  Laterality: Right;   CHOLECYSTECTOMY     COLONOSCOPY     EP Study  08/22/08   I & D EXTREMITY Right 09/06/2023   Procedure: IRRIGATION AND DEBRIDEMENT RIGHT ANKLE;  Surgeon: Jerri Kay HERO, MD;  Location: [facility] OR;  Service: Orthopedics;  Laterality: Right;   IMPLANTABLE CARDIOVERTER DEFIBRILLATOR GENERATOR CHANGE N/A 08/22/2013   Procedure: IMPLANTABLE CARDIOVERTER DEFIBRILLATOR GENERATOR CHANGE;  Surgeon: Elspeth JAYSON Sage, MD;  Location: [facility] CATH LAB;  Service: Cardiovascular;  Laterality: N/A;   KNEE  ARTHROSCOPY Left 04/14/2016   Procedure: LEFT KNEE ARTHROSCOPY, PARTIAL MEDIAL AND PARTIAL LATERAL MENISCECTOMY, MEDIAL PLICA;  Surgeon: Norleen Gavel, MD;  Location: [facility] OR;  Service: Orthopedics;  Laterality: Left;   mastoid sugery     in the setting of Meniere's disease   ORIF ANKLE FRACTURE Right 09/09/2023   Procedure: OPEN REDUCTION INTERNAL FIXATION (ORIF) ANKLE FRACTURE;  Surgeon: Harden Jerona GAILS, MD;  Location: [facility] OR;  Service: Orthopedics;  Laterality: Right;   TONSILLECTOMY AND ADENOIDECTOMY      MEDICATIONS: No current facility-administered medications for this encounter.    acetaminophen  (TYLENOL ) 500 MG tablet   aspirin  EC 81 MG tablet   aspirin  EC 81 MG tablet   atorvastatin  (LIPITOR) 40 MG tablet   diclofenac  Sodium (VOLTAREN  ARTHRITIS PAIN) 1 % GEL   diclofenac  Sodium (VOLTAREN  ARTHRITIS PAIN) 1 % GEL   furosemide  (LASIX ) 20 MG tablet   furosemide  (LASIX ) 40 MG tablet   hydrALAZINE  (APRESOLINE ) 50 MG tablet   INSULIN  ASPART IJ   insulin  glargine, 2 Unit Dial, (TOUJEO  MAX SOLOSTAR) 300 UNIT/ML Solostar Pen   loperamide  (IMODIUM  A-D) 2 MG tablet   metoprolol  succinate (TOPROL -XL) 100 MG 24 hr tablet   olmesartan  (BENICAR ) 40 MG tablet   ondansetron  (ZOFRAN ) 4 MG tablet   polyethylene glycol (MIRALAX  / GLYCOLAX ) 17 g packet   potassium chloride  (KLOR-CON  M) 10 MEQ tablet   potassium chloride  (KLOR-CON ) 10 MEQ tablet   risedronate (ACTONEL) 35 MG tablet   TOUJEO  MAX SOLOSTAR 300 UNIT/ML Solostar Pen     Isaiah Ruder, PA-C Surgical Short Stay/Anesthesiology [facility] Phone [phone] [facility] Phone [phone] 07/31/2024 4:48 PM

## 2024-07-31 NOTE — Progress Notes (Signed)
 Office Visit Note   Patient: Grace Robinson           Date of Birth: 25-Dec-1925           MRN: [id number] Visit Date: 07/31/2024              Requested by: Mast, Man X, NP [address] [place],  [place] [address] PCP: Mast, Man X, NP  Chief Complaint  Patient presents with   Right Ankle - Wound Check      HPI: Chief Complaint: Infected right ankle with retained hardware HPI: 89 y/o female who sustained open fracture of the right ankle on 09/06/23.  The wound was irrigated and then the next day Dr. Harden performed ORIF of the right ankle.  She has done well over all, until 7-10 days ago she has developed erythema and edema with a 0.5 cm open draining wound over the lateral right ankle.  She denies fever and chills.      Assessment & Plan: Visit Diagnoses:  1. Cellulitis of ankle     Plan:  Right ankle infection with retained hardware.   Plan removal of right ankle hard ware irrigation and debridement of the ankle.  Plan for application of antibiotics and cultures for oral antibiotics.  WBAT in post op shoe with Prevena vac for 1 week.   Follow-Up Instructions: Return in about 1 day (around 08/01/2024) for surgery.   Ortho Exam  Patient is alert, oriented, no adenopathy, well-dressed, normal affect, normal respiratory effort. Right lateral ankle 0.5 cm open draining sinus.  Probed with a q tip to hardware.  Cellulitis in the surrounding tissue.  Tenderness lateral lower leg to the ankle with mild edema.      Imaging: Maintained hardware later plate and screws with well healed right ankle fracture.   Labs: Lab Results  Component Value Date   HGBA1C 7.5 02/25/2023   HGBA1C 7.5 (H) 12/31/2022   HGBA1C 7.4 (H) 11/10/2020   ESRSEDRATE 48 (H) 09/06/2016   CRP 0.1 (L) 09/06/2016     Lab Results  Component Value Date   ALBUMIN 3.3 (A) 10/07/2023   ALBUMIN 3.3 (A) 09/27/2023   ALBUMIN 3.8 02/25/2023    Lab Results  Component Value Date   MG 2.2 09/10/2023   MG 2.1  09/09/2023   MG 2.0 09/08/2023   Lab Results  Component Value Date   VD25OH 41 02/25/2023    No results found for: PREALBUMIN    Latest Ref Rng & Units 01/20/2024    1:46 PM 10/07/2023   12:00 AM 09/28/2023   12:00 AM  CBC EXTENDED  WBC 4.0 - 10.5 K/uL 6.8  4.6     4.9      RBC 3.87 - 5.11 MIL/uL 4.48  3.57     3.44      Hemoglobin 12.0 - 15.0 g/dL 85.9  88.3     88.9      HCT 36.0 - 46.0 % 43.7  35     34      Platelets 150 - 400 K/uL 157  145     138      NEUT# 1.7 - 7.7 K/uL 5.6  2,838.00     3,244.00      Lymph# 0.7 - 4.0 K/uL 0.6        This result is from an external source.     There is no height or weight on file to calculate BMI.  Orders:  Orders Placed This  Encounter  Procedures   XR Ankle Complete Right   No orders of the defined types were placed in this encounter.    Procedures: No procedures performed  Clinical Data: No additional findings.  ROS:  All other systems negative, except as noted in the HPI. Review of Systems  Objective: Vital Signs: There were no vitals taken for this visit.  Specialty Comments:  No specialty comments available.  PMFS History: Patient Active Problem List   Diagnosis Date Noted   Numbness of right hand 11/03/2023   Right calf pain 10/10/2023   Cellulitis 09/26/2023   IBS (irritable bowel syndrome) 09/10/2023   Open wound of right ankle 09/09/2023   Hypokalemia 09/08/2023   Dislocation of ankle, right, open, initial encounter 09/07/2023   Open fracture of right distal fibula 09/06/2023   COVID-19 virus infection 06/09/2023   Gait abnormality 05/19/2023   Osteoarthritis 05/03/2023   Radiation dermatitis 05/03/2023   Osteopenia after menopause 03/04/2023   Closed pelvic fracture (HCC) 02/23/2023   Lower back pain 02/23/2023   Hydronephrosis 02/23/2023   Sigmoid diverticulitis 02/23/2023   Aortic atherosclerosis 02/23/2023   Hyperlipidemia associated with type 2 diabetes mellitus (HCC) 02/23/2023    Vitamin B12 deficiency 02/23/2023   Vitamin D  deficiency 02/23/2023   Type 2 diabetes mellitus with renal complication (HCC) 02/23/2023   S/P lumpectomy, right breast 01/05/2023   Ductal carcinoma in situ (DCIS) of right breast 12/06/2022   NICM (nonischemic cardiomyopathy) (HCC) 07/30/2021   Degenerative tear of posterior horn of medial meniscus of left knee 04/14/2016   Tear of lateral meniscus of left knee 04/14/2016   Synovial plica of left knee 04/14/2016   Mechanical complication of implantable cardioverter-defibrillator (ICD) 01/17/2014   Fall 07/10/2013   Paroxysmal ventricular tachycardia (HCC) 12/13/2012   Biventricular implantable cardioverter-defibrillator -Medtronic 12/08/2011   Atrial tachycardia 12/08/2011   HTN (hypertension) 05/19/2011   CARDIOMYOPATHY, PRIMARY, DILATED 12/01/2010   SYSTOLIC HEART FAILURE, CHRONIC 12/01/2010   Past Medical History:  Diagnosis Date   Biventricular ICD (implantable cardiac defibrillator) in place    downgraded from CRT-D to CRT-P 08/30/22   Breast cancer (HCC)    Cellulitis    Change in bowel habits    CHF (congestive heart failure) (HCC)    Colon polyps    Diabetes mellitus    Type II   Diarrhea    Dyslipidemia    HOH (hard of hearing)    left   HTN (hypertension)    LBBB (left bundle branch block)    chronic   Meniere's disease    Nonischemic cardiomyopathy (HCC) 2006   Presence of permanent cardiac pacemaker    T wave oversensing 01/17/2014   Ulcerative colitis    Varicose veins    4 lazer  treatment each leg    Family History  Problem Relation Age of Onset   Breast cancer Mother 58       met to lungs   Emphysema Father 30   Heart disease Father    Healthy Child    Lung cancer Cousin        maternal first cousin, she smoked   Colon cancer Neg Hx    Rectal cancer Neg Hx    Stomach cancer Neg Hx    Liver cancer Neg Hx    Esophageal cancer Neg Hx     Past Surgical History:  Procedure Laterality Date    ABDOMINAL HYSTERECTOMY     BIV PACEMAKER GENERATOR CHANGEOUT N/A 08/30/2022   Procedure: BIV PACEMAKER GENERATOR  CHANGEOUT;  Surgeon: Fernande Elspeth BROCKS, MD;  Location: [facility] INVASIVE CV LAB;  Service: Cardiovascular;  Laterality: N/A;   BREAST LUMPECTOMY WITH RADIOACTIVE SEED LOCALIZATION Right 01/05/2023   Procedure: RIGHT BREAST BRACKETED LUMPECTOMY WITH RADIOACTIVE SEED LOCALIZATION;  Surgeon: Vernetta Berg, MD;  Location: [facility] OR;  Service: General;  Laterality: Right;   CARDIAC CATHETERIZATION  08/2011   CAST APPLICATION Right 09/06/2023   Procedure: CAST APPLICATION;  Surgeon: Jerri Kay HERO, MD;  Location: [facility] OR;  Service: Orthopedics;  Laterality: Right;   CHOLECYSTECTOMY     COLONOSCOPY     EP Study  08/22/08   I & D EXTREMITY Right 09/06/2023   Procedure: IRRIGATION AND DEBRIDEMENT RIGHT ANKLE;  Surgeon: Jerri Kay HERO, MD;  Location: [facility] OR;  Service: Orthopedics;  Laterality: Right;   IMPLANTABLE CARDIOVERTER DEFIBRILLATOR GENERATOR CHANGE N/A 08/22/2013   Procedure: IMPLANTABLE CARDIOVERTER DEFIBRILLATOR GENERATOR CHANGE;  Surgeon: Elspeth BROCKS Fernande, MD;  Location: [facility] CATH LAB;  Service: Cardiovascular;  Laterality: N/A;   KNEE ARTHROSCOPY Left 04/14/2016   Procedure: LEFT KNEE ARTHROSCOPY, PARTIAL MEDIAL AND PARTIAL LATERAL MENISCECTOMY, MEDIAL PLICA;  Surgeon: Norleen Gavel, MD;  Location: [facility] OR;  Service: Orthopedics;  Laterality: Left;   mastoid sugery     in the setting of Meniere's disease   ORIF ANKLE FRACTURE Right 09/09/2023   Procedure: OPEN REDUCTION INTERNAL FIXATION (ORIF) ANKLE FRACTURE;  Surgeon: Harden Jerona GAILS, MD;  Location: [facility] OR;  Service: Orthopedics;  Laterality: Right;   TONSILLECTOMY AND ADENOIDECTOMY     Social History   Occupational History   Occupation: retired    Comment: Runner, 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

## 2024-07-31 NOTE — Progress Notes (Signed)
 Surgical Instructions   Your procedure is scheduled on Wednesday August 01, 2024. Report to [facility] Main Entrance A at 9:50 A.M., then check in with the Admitting office. Any questions or running late day of surgery: call [phone]  Questions prior to your surgery date: call [phone], Monday-Friday, 8am-4pm. If you experience any cold or flu symptoms such as cough, fever, chills, shortness of breath, etc. between now and your scheduled surgery, please notify us  at the above number.     Remember:  Do not eat after midnight the night before your surgery  You may drink clear liquids until 9:20 the morning of your surgery.   Clear liquids allowed are: Water, Non-Citrus Juices (without pulp), Carbonated Beverages, Clear Tea (no milk, honey, etc.), Black Coffee Only (NO MILK, CREAM OR POWDERED CREAMER of any kind), and Gatorade.    Take these medicines the morning of surgery with A SIP OF WATER  atorvastatin  (LIPITOR)  hydrALAZINE  (APRESOLINE )  metoprolol  succinate (TOPROL -XL)   May take these medicines IF NEEDED: acetaminophen  (TYLENOL )  ondansetron  (ZOFRAN )   One week prior to surgery, STOP taking any Aspirin  (unless otherwise instructed by your surgeon) Aleve, Naproxen, Ibuprofen, Motrin, Advil, Goody's, BC's, all herbal medications, fish oil, and non-prescription vitamins. This includes your diclofenac  Sodium (VOLTAREN  ARTHRITIS PAIN) 1 % GEL   WHAT DO I DO ABOUT MY DIABETES MEDICATION?   Do not take oral diabetes medicines (pills) the morning of surgery.  THE NIGHT BEFORE SURGERY TAKE ZERO UNITS OF YOUR INSULIN  ASPART.        THE MORNING OF SURGERY TAKE 23 UNITS OF YOUR insulin  glargine, 2 Unit Dial, (TOUJEO  MAX SOLOSTAR) WHICH IS 50% OF YOUR REGULAR DOSE.          THE MORNING OF SURGERY TAKE ZERO UNITS OF YOUR INSULIN  ASPART.  If your CBG is greater than 220 mg/dL, you may take  of your sliding scale (correction) dose of insulin .   The day of surgery, do not take  other diabetes injectables, including Byetta (exenatide), Bydureon (exenatide ER), Victoza (liraglutide), or Trulicity (dulaglutide).  HOW TO MANAGE YOUR DIABETES BEFORE AND AFTER SURGERY  Why is it important to control my blood sugar before and after surgery? Improving blood sugar levels before and after surgery helps healing and can limit problems. A way of improving blood sugar control is eating a healthy diet by:  Eating less sugar and carbohydrates  Increasing activity/exercise  Talking with your doctor about reaching your blood sugar goals High blood sugars (greater than 180 mg/dL) can raise your risk of infections and slow your recovery, so you will need to focus on controlling your diabetes during the weeks before surgery. Make sure that the doctor who takes care of your diabetes knows about your planned surgery including the date and location.  How do I manage my blood sugar before surgery? Check your blood sugar at least 4 times a day, starting 2 days before surgery, to make sure that the level is not too high or low.  Check your blood sugar the morning of your surgery when you wake up and every 2 hours until you get to the Short Stay unit.  If your blood sugar is less than 70 mg/dL, you will need to treat for low blood sugar: Do not take insulin . Treat a low blood sugar (less than 70 mg/dL) with  cup of clear juice (cranberry or apple), 4 glucose tablets, OR glucose gel. Recheck blood sugar in 15 minutes after treatment (to make sure  it is greater than 70 mg/dL). If your blood sugar is not greater than 70 mg/dL on recheck, call [phone] for further instructions. Report your blood sugar to the short stay nurse when you get to Short Stay.  If you are admitted to the hospital after surgery: Your blood sugar will be checked by the staff and you will probably be given insulin  after surgery (instead of oral diabetes medicines) to make sure you have good blood sugar levels. The  goal for blood sugar control after surgery is 80-180 mg/dL.                      Do NOT Smoke (Tobacco/Vaping) for 24 hours prior to your procedure.  If you use a CPAP at night, you may bring your mask/headgear for your overnight stay.   You will be asked to remove any contacts, glasses, piercing's, hearing aid's, dentures/partials prior to surgery. Please bring cases for these items if needed.    Patients discharged the day of surgery will not be allowed to drive home, and someone needs to stay with them for 24 hours.  SURGICAL WAITING ROOM VISITATION Patients may have no more than 2 support people in the waiting area - these visitors may rotate.   Pre-op nurse will coordinate an appropriate time for 1 ADULT support person, who may not rotate, to accompany patient in pre-op.  Children under the age of 65 must have an adult with them who is not the patient and must remain in the main waiting area with an adult.  If the patient needs to stay at the hospital during part of their recovery, the visitor guidelines for inpatient rooms apply.  Please refer to the [facility] website for the visitor guidelines for any additional information.   If you received a COVID test during your pre-op visit  it is requested that you wear a mask when out in public, stay away from anyone that may not be feeling well and notify your surgeon if you develop symptoms. If you have been in contact with anyone that has tested positive in the last 10 days please notify you surgeon.      Pre-operative Dial soap Bathing Instructions   You can play a key role in reducing the risk of infection after surgery. Your skin needs to be as free of germs as possible. You can reduce the number of germs on your skin by washing with Dial soap before surgery. Call  [phone] for any questions             TAKE A SHOWER THE NIGHT BEFORE SURGERY AND THE DAY OF SURGERY    Please keep in mind the following:  DO NOT shave,  including legs and underarms, 48 hours prior to surgery.   Place clean sheets on your bed the night before surgery Use a clean washcloth (not used since being washed) for each shower.  Shower Instructions:  Oncologist and private area with normal soap. If you choose to wash your hair, wash first with your normal shampoo.  After you use shampoo/soap, rinse your hair and body thoroughly to remove shampoo/soap residue.  3.   Pat dry with a clean towel  4.   Put on clean pajamas    Additional instructions for the day of surgery: DO NOT APPLY any lotions, deodorants or perfumes.   Do not wear jewelry or makeup Do not wear nail polish, gel polish, artificial nails, or any other type of  covering on natural nails (fingers and toes) Do not bring valuables to the hospital. [facility] is not responsible for valuables/personal belongings. Put on clean/comfortable clothes.  Please brush your teeth.  Ask your nurse before applying any prescription medications to the skin.

## 2024-07-31 NOTE — H&P (Cosign Needed Addendum)
 Grace Robinson is an 89 y.o. female.   Chief Complaint: Infected right ankle with retained hardware HPI: 89 y/o female who sustained open fracture of the right ankle on 09/06/23.  The wound was irrigated and then the next day Dr. Harden performed ORIF of the right ankle.  She has done well over all, until 7-10 days ago she has developed erythema and edema with a 0.5 cm open draining wound over the lateral right ankle.  She denies fever and chills.    Past Medical History:  Diagnosis Date   Biventricular ICD (implantable cardiac defibrillator) in place    downgraded from CRT-D to CRT-P 08/30/22   Breast cancer (HCC)    Cellulitis    Change in bowel habits    CHF (congestive heart failure) (HCC)    Colon polyps    Diabetes mellitus    Type II   Diarrhea    Dyslipidemia    HOH (hard of hearing)    left   HTN (hypertension)    LBBB (left bundle branch block)    chronic   Meniere's disease    Nonischemic cardiomyopathy (HCC) 2006   Presence of permanent cardiac pacemaker    T wave oversensing 01/17/2014   Ulcerative colitis    Varicose veins    4 lazer  treatment each leg    Past Surgical History:  Procedure Laterality Date   ABDOMINAL HYSTERECTOMY     BIV PACEMAKER GENERATOR CHANGEOUT N/A 08/30/2022   Procedure: BIV PACEMAKER GENERATOR CHANGEOUT;  Surgeon: Fernande Elspeth BROCKS, MD;  Location: [facility] INVASIVE CV LAB;  Service: Cardiovascular;  Laterality: N/A;   BREAST LUMPECTOMY WITH RADIOACTIVE SEED LOCALIZATION Right 01/05/2023   Procedure: RIGHT BREAST BRACKETED LUMPECTOMY WITH RADIOACTIVE SEED LOCALIZATION;  Surgeon: Vernetta Berg, MD;  Location: [facility] OR;  Service: General;  Laterality: Right;   CARDIAC CATHETERIZATION  08/2011   CAST APPLICATION Right 09/06/2023   Procedure: CAST APPLICATION;  Surgeon: Jerri Kay CHRISTELLA, MD;  Location: [facility] OR;  Service: Orthopedics;  Laterality: Right;   CHOLECYSTECTOMY     COLONOSCOPY     EP Study  08/22/08   I & D EXTREMITY Right 09/06/2023   Procedure:  IRRIGATION AND DEBRIDEMENT RIGHT ANKLE;  Surgeon: Jerri Kay CHRISTELLA, MD;  Location: [facility] OR;  Service: Orthopedics;  Laterality: Right;   IMPLANTABLE CARDIOVERTER DEFIBRILLATOR GENERATOR CHANGE N/A 08/22/2013   Procedure: IMPLANTABLE CARDIOVERTER DEFIBRILLATOR GENERATOR CHANGE;  Surgeon: Elspeth BROCKS Fernande, MD;  Location: [facility] CATH LAB;  Service: Cardiovascular;  Laterality: N/A;   KNEE ARTHROSCOPY Left 04/14/2016   Procedure: LEFT KNEE ARTHROSCOPY, PARTIAL MEDIAL AND PARTIAL LATERAL MENISCECTOMY, MEDIAL PLICA;  Surgeon: Norleen Gavel, MD;  Location: [facility] OR;  Service: Orthopedics;  Laterality: Left;   mastoid sugery     in the setting of Meniere's disease   ORIF ANKLE FRACTURE Right 09/09/2023   Procedure: OPEN REDUCTION INTERNAL FIXATION (ORIF) ANKLE FRACTURE;  Surgeon: Harden Jerona GAILS, MD;  Location: [facility] OR;  Service: Orthopedics;  Laterality: Right;   TONSILLECTOMY AND ADENOIDECTOMY      Family History  Problem Relation Age of Onset   Breast cancer Mother 87       met to lungs   Emphysema Father 6   Heart disease Father    Healthy Child    Lung cancer Cousin        maternal first cousin, she smoked   Colon cancer Neg Hx    Rectal cancer Neg Hx    Stomach cancer Neg Hx  Liver cancer Neg Hx    Esophageal cancer Neg Hx    Social History:  reports that she has quit smoking. Her smoking use included cigarettes. She has never been exposed to tobacco smoke. She has never used smokeless tobacco. She reports that she does not drink alcohol and does not use drugs.  Allergies:  Allergies  Allergen Reactions   Coreg [Carvedilol] Other (See Comments)    Unknown reaction   Firvanq [Vancomycin] Itching and Other (See Comments)    Red Man Syndrome   Lactose Intolerance (Gi)    Lanoxin [Digoxin] Other (See Comments)    DIG toxicity   Sudafed [Pseudoephedrine] Other (See Comments)    Makes skin feel like its crawling     No medications prior to admission.    No results found for this or any previous  visit (from the past 48 hours). No results found.  Review of Systems  All other systems reviewed and are negative.   There were no vitals taken for this visit. Physical Exam  Patient is alert, oriented, no adenopathy, well-dressed, normal affect, normal respiratory effort. Right lateral ankle 0.5 cm open draining sinus.  Probed with a q tip to hardware.  Cellulitis in the surrounding tissue.  Tenderness lateral lower leg to the ankle with mild edema.    Assessment/Plan Right ankle infection with retained hardware.   Plan removal of right ankle hard ware irrigation and debridement of the ankle.  Plan for application of antibiotics and cultures for oral antibiotics.  WBAT in post op shoe with Prevena vac for 1 week.    Maurilio Deland Collet, PA-C 07/31/2024, 5:04 PM

## 2024-08-01 ENCOUNTER — Encounter (HOSPITAL_COMMUNITY)
Admission: RE | Disposition: A | Discharge status: Home / Self Care | Payer: Self-pay | Source: Home / Self Care | Attending: Orthopedic Surgery

## 2024-08-01 ENCOUNTER — Encounter (HOSPITAL_COMMUNITY): Payer: Self-pay | Admitting: Orthopedic Surgery

## 2024-08-01 ENCOUNTER — Ambulatory Visit (HOSPITAL_COMMUNITY): Payer: Self-pay | Admitting: Vascular Surgery

## 2024-08-01 ENCOUNTER — Ambulatory Visit (HOSPITAL_BASED_OUTPATIENT_CLINIC_OR_DEPARTMENT_OTHER): Payer: Self-pay | Admitting: Vascular Surgery

## 2024-08-01 ENCOUNTER — Other Ambulatory Visit: Payer: Self-pay

## 2024-08-01 ENCOUNTER — Observation Stay (HOSPITAL_COMMUNITY)
Admission: RE | Admit: 2024-08-01 | Discharge: 2024-08-03 | Disposition: A | Discharge status: Home / Self Care | Attending: Orthopedic Surgery | Admitting: Orthopedic Surgery

## 2024-08-01 DIAGNOSIS — I428 Other cardiomyopathies: Secondary | ICD-10-CM | POA: Diagnosis not present

## 2024-08-01 DIAGNOSIS — I110 Hypertensive heart disease with heart failure: Secondary | ICD-10-CM | POA: Diagnosis not present

## 2024-08-01 DIAGNOSIS — I5022 Chronic systolic (congestive) heart failure: Secondary | ICD-10-CM | POA: Diagnosis not present

## 2024-08-01 DIAGNOSIS — Z87891 Personal history of nicotine dependence: Secondary | ICD-10-CM | POA: Insufficient documentation

## 2024-08-01 DIAGNOSIS — Z853 Personal history of malignant neoplasm of breast: Secondary | ICD-10-CM | POA: Insufficient documentation

## 2024-08-01 DIAGNOSIS — Z794 Long term (current) use of insulin: Secondary | ICD-10-CM | POA: Insufficient documentation

## 2024-08-01 DIAGNOSIS — L97314 Non-pressure chronic ulcer of right ankle with necrosis of bone: Secondary | ICD-10-CM | POA: Diagnosis not present

## 2024-08-01 DIAGNOSIS — Z7982 Long term (current) use of aspirin: Secondary | ICD-10-CM | POA: Insufficient documentation

## 2024-08-01 DIAGNOSIS — I5020 Unspecified systolic (congestive) heart failure: Secondary | ICD-10-CM | POA: Diagnosis not present

## 2024-08-01 DIAGNOSIS — T847XXA Infection and inflammatory reaction due to other internal orthopedic prosthetic devices, implants and grafts, initial encounter: Secondary | ICD-10-CM

## 2024-08-01 DIAGNOSIS — M869 Osteomyelitis, unspecified: Secondary | ICD-10-CM | POA: Diagnosis not present

## 2024-08-01 DIAGNOSIS — E11622 Type 2 diabetes mellitus with other skin ulcer: Secondary | ICD-10-CM | POA: Diagnosis not present

## 2024-08-01 DIAGNOSIS — Y792 Prosthetic and other implants, materials and accessory orthopedic devices associated with adverse incidents: Secondary | ICD-10-CM | POA: Insufficient documentation

## 2024-08-01 DIAGNOSIS — I509 Heart failure, unspecified: Secondary | ICD-10-CM | POA: Insufficient documentation

## 2024-08-01 DIAGNOSIS — B999 Unspecified infectious disease: Secondary | ICD-10-CM | POA: Diagnosis present

## 2024-08-01 DIAGNOSIS — Z472 Encounter for removal of internal fixation device: Secondary | ICD-10-CM

## 2024-08-01 DIAGNOSIS — M86172 Other acute osteomyelitis, left ankle and foot: Secondary | ICD-10-CM | POA: Diagnosis not present

## 2024-08-01 DIAGNOSIS — T8469XA Infection and inflammatory reaction due to internal fixation device of other site, initial encounter: Secondary | ICD-10-CM | POA: Diagnosis not present

## 2024-08-01 HISTORY — PX: HARDWARE REMOVAL: SHX979

## 2024-08-01 HISTORY — PX: INCISION AND DRAINAGE OF DEEP ABSCESS, CALF: SHX7361

## 2024-08-01 LAB — CBC
HCT: 32.2 % — ABNORMAL LOW (ref 36.0–46.0)
Hemoglobin: 10.8 g/dL — ABNORMAL LOW (ref 12.0–15.0)
MCH: 32 pg (ref 26.0–34.0)
MCHC: 33.5 g/dL (ref 30.0–36.0)
MCV: 95.5 fL (ref 80.0–100.0)
Platelets: 197 K/uL (ref 150–400)
RBC: 3.37 MIL/uL — ABNORMAL LOW (ref 3.87–5.11)
RDW: 13 % (ref 11.5–15.5)
WBC: 7.3 K/uL (ref 4.0–10.5)
nRBC: 0 % (ref 0.0–0.2)

## 2024-08-01 LAB — POCT I-STAT, CHEM 8
BUN: 27 mg/dL — ABNORMAL HIGH (ref 8–23)
Calcium, Ion: 1.08 mmol/L — ABNORMAL LOW (ref 1.15–1.40)
Chloride: 102 mmol/L (ref 98–111)
Creatinine, Ser: 1.2 mg/dL — ABNORMAL HIGH (ref 0.44–1.00)
Glucose, Bld: 209 mg/dL — ABNORMAL HIGH (ref 70–99)
HCT: 33 % — ABNORMAL LOW (ref 36.0–46.0)
Hemoglobin: 11.2 g/dL — ABNORMAL LOW (ref 12.0–15.0)
Potassium: 4 mmol/L (ref 3.5–5.1)
Sodium: 139 mmol/L (ref 135–145)
TCO2: 25 mmol/L (ref 22–32)

## 2024-08-01 LAB — GLUCOSE, CAPILLARY
Glucose-Capillary: 212 mg/dL — ABNORMAL HIGH (ref 70–99)
Glucose-Capillary: 184 mg/dL — ABNORMAL HIGH (ref 70–99)
Glucose-Capillary: 257 mg/dL — ABNORMAL HIGH (ref 70–99)

## 2024-08-01 LAB — CREATININE, SERUM
Creatinine, Ser: 1.46 mg/dL — ABNORMAL HIGH (ref 0.44–1.00)
GFR, Estimated: 33 mL/min — ABNORMAL LOW (ref >60)

## 2024-08-01 SURGERY — REMOVAL, HARDWARE
Anesthesia: General | Laterality: Right

## 2024-08-01 MED ORDER — LACTATED RINGERS IV SOLN: INTRAVENOUS | Status: DC

## 2024-08-01 MED ORDER — METOPROLOL SUCCINATE ER 50 MG PO TB24
100.0000 mg | ORAL_TABLET | Freq: Two times a day (BID) | ORAL | Status: DC
  Administered 2024-08-01 – 2024-08-03 (×4): 100 mg via ORAL
  Filled 2024-08-01 (×4): qty 2

## 2024-08-01 MED ORDER — ACETAMINOPHEN 500 MG PO TABS
500.0000 mg | ORAL_TABLET | Freq: Four times a day (QID) | ORAL | Status: AC
  Administered 2024-08-01 – 2024-08-02 (×3): 500 mg via ORAL
  Filled 2024-08-01 (×3): qty 1

## 2024-08-01 MED ORDER — POTASSIUM CHLORIDE CRYS ER 10 MEQ PO TBCR
20.0000 meq | EXTENDED_RELEASE_TABLET | Freq: Two times a day (BID) | ORAL | Status: DC
  Administered 2024-08-01 – 2024-08-03 (×5): 20 meq via ORAL
  Filled 2024-08-01 (×8): qty 2

## 2024-08-01 MED ORDER — HYDRALAZINE HCL 50 MG PO TABS: 50.0000 mg | ORAL_TABLET | Freq: Two times a day (BID) | ORAL | Status: DC

## 2024-08-01 MED ORDER — VANCOMYCIN HCL 1000 MG IV SOLR
INTRAVENOUS | Status: AC
  Filled 2024-08-01: qty 20

## 2024-08-01 MED ORDER — HYDRALAZINE HCL 50 MG PO TABS
50.0000 mg | ORAL_TABLET | Freq: Two times a day (BID) | ORAL | Status: DC
  Administered 2024-08-01 – 2024-08-03 (×4): 50 mg via ORAL
  Filled 2024-08-01 (×4): qty 1

## 2024-08-01 MED ORDER — DOCUSATE SODIUM 100 MG PO CAPS
100.0000 mg | ORAL_CAPSULE | Freq: Two times a day (BID) | ORAL | Status: DC
  Administered 2024-08-01 – 2024-08-02 (×3): 100 mg via ORAL
  Filled 2024-08-01 (×4): qty 1

## 2024-08-01 MED ORDER — ATORVASTATIN CALCIUM 40 MG PO TABS
40.0000 mg | ORAL_TABLET | Freq: Every day | ORAL | Status: DC
  Administered 2024-08-02 – 2024-08-03 (×2): 40 mg via ORAL
  Filled 2024-08-01 (×2): qty 1

## 2024-08-01 MED ORDER — FUROSEMIDE 20 MG PO TABS
20.0000 mg | ORAL_TABLET | Freq: Every day | ORAL | Status: DC
  Administered 2024-08-01: 20 mg via ORAL
  Filled 2024-08-01 (×2): qty 1

## 2024-08-01 MED ORDER — LACTATED RINGERS IV SOLN: INTRAVENOUS | Status: DC | PRN

## 2024-08-01 MED ORDER — INSULIN ASPART 100 UNIT/ML IJ SOLN
10.0000 [IU] | Freq: Three times a day (TID) | INTRAMUSCULAR | Status: DC
  Administered 2024-08-01 – 2024-08-03 (×3): 10 [IU] via SUBCUTANEOUS

## 2024-08-01 MED ORDER — VANCOMYCIN HCL 500 MG IV SOLR
INTRAVENOUS | Status: DC | PRN
  Administered 2024-08-01: 1000 mg

## 2024-08-01 MED ORDER — ONDANSETRON HCL 4 MG PO TABS: 4.0000 mg | ORAL_TABLET | Freq: Four times a day (QID) | ORAL | Status: DC | PRN

## 2024-08-01 MED ORDER — METOCLOPRAMIDE HCL 5 MG/ML IJ SOLN: 5.0000 mg | Freq: Three times a day (TID) | INTRAMUSCULAR | Status: DC | PRN

## 2024-08-01 MED ORDER — VASHE WOUND IRRIGATION OPTIME
TOPICAL | Status: DC | PRN
  Administered 2024-08-01: 34 [oz_av]

## 2024-08-01 MED ORDER — ORAL CARE MOUTH RINSE: 15.0000 mL | Freq: Once | OROMUCOSAL | Status: AC

## 2024-08-01 MED ORDER — ASPIRIN 81 MG PO TBEC
81.0000 mg | DELAYED_RELEASE_TABLET | Freq: Every day | ORAL | Status: DC
Start: 2024-08-01 — End: 2024-08-03
  Administered 2024-08-01 – 2024-08-03 (×3): 81 mg via ORAL
  Filled 2024-08-01 (×3): qty 1

## 2024-08-01 MED ORDER — DEXAMETHASONE SODIUM PHOSPHATE 10 MG/ML IJ SOLN
INTRAMUSCULAR | Status: DC | PRN
  Administered 2024-08-01: 5 mg via INTRAVENOUS

## 2024-08-01 MED ORDER — FENTANYL CITRATE (PF) 100 MCG/2ML IJ SOLN
INTRAMUSCULAR | Status: AC
  Filled 2024-08-01: qty 2

## 2024-08-01 MED ORDER — ONDANSETRON HCL 4 MG/2ML IJ SOLN: 4.0000 mg | Freq: Four times a day (QID) | INTRAMUSCULAR | Status: DC | PRN

## 2024-08-01 MED ORDER — INSULIN GLARGINE 100 UNIT/ML ~~LOC~~ SOLN
36.0000 [IU] | Freq: Every day | SUBCUTANEOUS | Status: DC
  Administered 2024-08-02 – 2024-08-03 (×2): 36 [IU] via SUBCUTANEOUS
  Filled 2024-08-01 (×2): qty 0.36

## 2024-08-01 MED ORDER — PHENYLEPHRINE 80 MCG/ML (10ML) SYRINGE FOR IV PUSH (FOR BLOOD PRESSURE SUPPORT)
PREFILLED_SYRINGE | INTRAVENOUS | Status: DC | PRN
  Administered 2024-08-01 (×2): 80 ug via INTRAVENOUS

## 2024-08-01 MED ORDER — GENTAMICIN SULFATE 40 MG/ML IJ SOLN
INTRAMUSCULAR | Status: AC
  Filled 2024-08-01: qty 2

## 2024-08-01 MED ORDER — ACETAMINOPHEN 500 MG PO TABS
500.0000 mg | ORAL_TABLET | Freq: Three times a day (TID) | ORAL | Status: DC | PRN
  Administered 2024-08-03: 500 mg via ORAL
  Filled 2024-08-01: qty 1

## 2024-08-01 MED ORDER — OXYCODONE HCL 5 MG/5ML PO SOLN: 5.0000 mg | Freq: Once | ORAL | Status: DC | PRN

## 2024-08-01 MED ORDER — ACETAMINOPHEN 325 MG PO TABS
325.0000 mg | ORAL_TABLET | Freq: Four times a day (QID) | ORAL | Status: DC | PRN
  Administered 2024-08-02: 650 mg via ORAL
  Filled 2024-08-01: qty 2

## 2024-08-01 MED ORDER — MORPHINE SULFATE (PF) 2 MG/ML IV SOLN: 0.5000 mg | INTRAVENOUS | Status: DC | PRN

## 2024-08-01 MED ORDER — CEFAZOLIN SODIUM-DEXTROSE 2-4 GM/100ML-% IV SOLN
2.0000 g | INTRAVENOUS | Status: AC
  Administered 2024-08-01: 2 g via INTRAVENOUS
  Filled 2024-08-01: qty 100

## 2024-08-01 MED ORDER — HYDROCODONE-ACETAMINOPHEN 7.5-325 MG PO TABS: 1.0000 | ORAL_TABLET | ORAL | Status: DC | PRN

## 2024-08-01 MED ORDER — LIDOCAINE 2% (20 MG/ML) 5 ML SYRINGE
INTRAMUSCULAR | Status: DC | PRN
  Administered 2024-08-01: 100 mg via INTRAVENOUS

## 2024-08-01 MED ORDER — FUROSEMIDE 40 MG PO TABS
40.0000 mg | ORAL_TABLET | Freq: Two times a day (BID) | ORAL | Status: DC
  Administered 2024-08-01 – 2024-08-03 (×4): 40 mg via ORAL
  Filled 2024-08-01 (×4): qty 1

## 2024-08-01 MED ORDER — ONDANSETRON HCL 4 MG/2ML IJ SOLN: 4.0000 mg | Freq: Once | INTRAMUSCULAR | Status: DC | PRN

## 2024-08-01 MED ORDER — CEFAZOLIN SODIUM-DEXTROSE 2-4 GM/100ML-% IV SOLN
2.0000 g | Freq: Four times a day (QID) | INTRAVENOUS | Status: AC
  Administered 2024-08-01 – 2024-08-02 (×2): 2 g via INTRAVENOUS
  Filled 2024-08-01 (×2): qty 100

## 2024-08-01 MED ORDER — METOCLOPRAMIDE HCL 5 MG PO TABS: 5.0000 mg | ORAL_TABLET | Freq: Three times a day (TID) | ORAL | Status: DC | PRN

## 2024-08-01 MED ORDER — FENTANYL CITRATE (PF) 250 MCG/5ML IJ SOLN
INTRAMUSCULAR | Status: AC
  Filled 2024-08-01: qty 5

## 2024-08-01 MED ORDER — INSULIN ASPART 100 UNIT/ML IJ SOLN
0.0000 [IU] | INTRAMUSCULAR | Status: DC | PRN
  Administered 2024-08-01: 2 [IU] via SUBCUTANEOUS
  Filled 2024-08-01: qty 1

## 2024-08-01 MED ORDER — INSULIN GLARGINE (2 UNIT DIAL) 300 UNIT/ML ~~LOC~~ SOPN: 45.0000 [IU] | PEN_INJECTOR | Freq: Every day | SUBCUTANEOUS | Status: DC

## 2024-08-01 MED ORDER — HYDROCODONE-ACETAMINOPHEN 5-325 MG PO TABS
1.0000 | ORAL_TABLET | ORAL | Status: DC | PRN
  Administered 2024-08-01: 1 via ORAL
  Filled 2024-08-01: qty 1

## 2024-08-01 MED ORDER — ACETAMINOPHEN 10 MG/ML IV SOLN: 1000.0000 mg | Freq: Once | INTRAVENOUS | Status: DC | PRN

## 2024-08-01 MED ORDER — PHENYLEPHRINE 80 MCG/ML (10ML) SYRINGE FOR IV PUSH (FOR BLOOD PRESSURE SUPPORT)
PREFILLED_SYRINGE | INTRAVENOUS | Status: AC
  Filled 2024-08-01: qty 40

## 2024-08-01 MED ORDER — FENTANYL CITRATE (PF) 250 MCG/5ML IJ SOLN
INTRAMUSCULAR | Status: DC | PRN
  Administered 2024-08-01: 50 ug via INTRAVENOUS
  Administered 2024-08-01 (×2): 25 ug via INTRAVENOUS
  Administered 2024-08-01: 50 ug via INTRAVENOUS

## 2024-08-01 MED ORDER — OXYCODONE HCL 5 MG PO TABS: 5.0000 mg | ORAL_TABLET | Freq: Once | ORAL | Status: DC | PRN

## 2024-08-01 MED ORDER — TOBRAMYCIN SULFATE 80 MG/2ML IJ SOLN
INTRAMUSCULAR | Status: AC
  Filled 2024-08-01: qty 2

## 2024-08-01 MED ORDER — POLYETHYLENE GLYCOL 3350 17 G PO PACK
17.0000 g | PACK | Freq: Every day | ORAL | Status: DC | PRN
  Administered 2024-08-02: 17 g via ORAL
  Filled 2024-08-01: qty 1

## 2024-08-01 MED ORDER — ONDANSETRON HCL 4 MG/2ML IJ SOLN
INTRAMUSCULAR | Status: DC | PRN
  Administered 2024-08-01: 4 mg via INTRAVENOUS

## 2024-08-01 MED ORDER — TOBRAMYCIN SULFATE 80 MG/2ML IJ SOLN
INTRAMUSCULAR | Status: DC | PRN
  Administered 2024-08-01: 40 mg via INTRAMUSCULAR

## 2024-08-01 MED ORDER — ENOXAPARIN SODIUM 40 MG/0.4ML IJ SOSY: 40.0000 mg | PREFILLED_SYRINGE | INTRAMUSCULAR | Status: DC

## 2024-08-01 MED ORDER — SODIUM CHLORIDE 0.9 % IV SOLN: INTRAVENOUS | Status: AC

## 2024-08-01 MED ORDER — DICLOFENAC SODIUM 1 % EX GEL: 2.0000 g | Freq: Three times a day (TID) | CUTANEOUS | Status: DC | PRN

## 2024-08-01 MED ORDER — CHLORHEXIDINE GLUCONATE 0.12 % MT SOLN
15.0000 mL | Freq: Once | OROMUCOSAL | Status: AC
  Administered 2024-08-01: 15 mL via OROMUCOSAL
  Filled 2024-08-01: qty 15

## 2024-08-01 MED ORDER — FENTANYL CITRATE (PF) 100 MCG/2ML IJ SOLN
25.0000 ug | INTRAMUSCULAR | Status: DC | PRN
  Administered 2024-08-01 (×2): 25 ug via INTRAVENOUS

## 2024-08-01 MED ORDER — ENOXAPARIN SODIUM 30 MG/0.3ML IJ SOSY
30.0000 mg | PREFILLED_SYRINGE | INTRAMUSCULAR | Status: DC
  Administered 2024-08-02: 30 mg via SUBCUTANEOUS
  Filled 2024-08-01: qty 0.3

## 2024-08-01 MED ORDER — PROPOFOL 10 MG/ML IV BOLUS
INTRAVENOUS | Status: DC | PRN
  Administered 2024-08-01: 20 mg via INTRAVENOUS
  Administered 2024-08-01: 80 mg via INTRAVENOUS

## 2024-08-01 MED ORDER — LOPERAMIDE HCL 2 MG PO CAPS: 2.0000 mg | ORAL_CAPSULE | Freq: Four times a day (QID) | ORAL | Status: DC | PRN

## 2024-08-01 SURGICAL SUPPLY — 55 items
BAG COUNTER SPONGE SURGICOUNT (BAG) ×1 IMPLANT
BANDAGE ESMARK 6X9 LF (GAUZE/BANDAGES/DRESSINGS) IMPLANT
BLADE SURG 21 STRL SS (BLADE) ×1 IMPLANT
BNDG COHESIVE 1X5 TAN STRL LF (GAUZE/BANDAGES/DRESSINGS) IMPLANT
BNDG COHESIVE 4X5 TAN STRL LF (GAUZE/BANDAGES/DRESSINGS) IMPLANT
BNDG COHESIVE 6X5 TAN NS LF (GAUZE/BANDAGES/DRESSINGS) IMPLANT
BNDG COHESIVE 6X5 TAN ST LF (GAUZE/BANDAGES/DRESSINGS) IMPLANT
BNDG GAUZE DERMACEA FLUFF 4 (GAUZE/BANDAGES/DRESSINGS) ×1 IMPLANT
CANISTER WOUNDNEG PRESSURE 500 (CANNISTER) IMPLANT
CLEANSER WND VASHE 34 (WOUND CARE) IMPLANT
CLEANSER WND VASHE INSTL 34OZ (WOUND CARE) IMPLANT
COVER SURGICAL LIGHT HANDLE (MISCELLANEOUS) ×2 IMPLANT
CUFF TOURN SGL QUICK 42 (TOURNIQUET CUFF) IMPLANT
CUFF TRNQT CYL 34X4.125X (TOURNIQUET CUFF) IMPLANT
DRAPE C-ARM 42X72 X-RAY (DRAPES) IMPLANT
DRAPE DERMATAC (DRAPES) IMPLANT
DRAPE INCISE IOBAN 66X45 STRL (DRAPES) IMPLANT
DRAPE SURG ORHT 6 SPLT 77X108 (DRAPES) IMPLANT
DRAPE U-SHAPE 47X51 STRL (DRAPES) ×1 IMPLANT
DRESSING PREVENA PLUS CUSTOM (GAUZE/BANDAGES/DRESSINGS) IMPLANT
DRESSING VERAFLO CLEANS CC MED (GAUZE/BANDAGES/DRESSINGS) IMPLANT
DRSG ADAPTIC 3X8 NADH LF (GAUZE/BANDAGES/DRESSINGS) ×1 IMPLANT
DRSG EMULSION OIL 3X3 NADH (GAUZE/BANDAGES/DRESSINGS) ×1 IMPLANT
DURAPREP 26ML APPLICATOR (WOUND CARE) ×1 IMPLANT
ELECTRODE REM PT RTRN 9FT ADLT (ELECTROSURGICAL) ×1 IMPLANT
GAUZE PAD ABD 8X10 STRL (GAUZE/BANDAGES/DRESSINGS) ×1 IMPLANT
GAUZE SPONGE 4X4 12PLY STRL (GAUZE/BANDAGES/DRESSINGS) ×1 IMPLANT
GLOVE BIOGEL PI IND STRL 9 (GLOVE) ×1 IMPLANT
GLOVE SURG ORTHO 9.0 STRL STRW (GLOVE) ×1 IMPLANT
GOWN STRL REUS W/ TWL XL LVL3 (GOWN DISPOSABLE) ×3 IMPLANT
GRAFT SKIN WND MARIGEN OMEGA3 (Tissue) IMPLANT
KIT BASIN OR (CUSTOM PROCEDURE TRAY) ×1 IMPLANT
KIT TURNOVER KIT B (KITS) ×1 IMPLANT
MANIFOLD NEPTUNE II (INSTRUMENTS) ×1 IMPLANT
PACK ORTHO EXTREMITY (CUSTOM PROCEDURE TRAY) ×1 IMPLANT
PAD ARMBOARD POSITIONER FOAM (MISCELLANEOUS) ×2 IMPLANT
PAD NEG PRESSURE SENSATRAC (MISCELLANEOUS) IMPLANT
SET HNDPC FAN SPRY TIP SCT (DISPOSABLE) IMPLANT
SOLN 0.9% NACL 1000 ML (IV SOLUTION) ×1 IMPLANT
SOLN 0.9% NACL POUR BTL 1000ML (IV SOLUTION) ×1 IMPLANT
SOLN STERILE WATER 1000 ML (IV SOLUTION) ×1 IMPLANT
SOLN STERILE WATER BTL 1000 ML (IV SOLUTION) ×1 IMPLANT
SPONGE T-LAP 18X18 ~~LOC~~+RFID (SPONGE) IMPLANT
STAPLER SKIN PROX 35W (STAPLE) IMPLANT
STOCKINETTE IMPERVIOUS 9X36 MD (GAUZE/BANDAGES/DRESSINGS) IMPLANT
SUT ETHILON 2 0 PSLX (SUTURE) ×1 IMPLANT
SUT VIC AB 0 CT1 27XBRD ANBCTR (SUTURE) IMPLANT
SUT VIC AB 2-0 CT1 TAPERPNT 27 (SUTURE) IMPLANT
SWAB COLLECTION DEVICE MRSA (MISCELLANEOUS) ×1 IMPLANT
SWAB CULTURE ESWAB REG 1ML (MISCELLANEOUS) IMPLANT
TOWEL GREEN STERILE (TOWEL DISPOSABLE) ×1 IMPLANT
TOWEL GREEN STERILE FF (TOWEL DISPOSABLE) ×1 IMPLANT
TUBE CONNECTING 12X1/4 (SUCTIONS) ×1 IMPLANT
UNDERPAD 30X36 HEAVY ABSORB (UNDERPADS AND DIAPERS) ×1 IMPLANT
YANKAUER SUCT BULB TIP NO VENT (SUCTIONS) ×1 IMPLANT

## 2024-08-01 NOTE — Anesthesia Procedure Notes (Signed)
 Procedure Name: LMA Insertion Date/Time: 08/01/2024 11:24 AM  Performed by: Scherrie Mast, CRNAPre-anesthesia Checklist: Patient identified, Emergency Drugs available, Suction available and Patient being monitored Patient Re-evaluated:Patient Re-evaluated prior to induction Oxygen Delivery Method: Circle System Utilized Preoxygenation: Pre-oxygenation with 100% oxygen Induction Type: IV induction LMA: LMA inserted LMA Size: 4.0 Number of attempts: 1 Airway Equipment and Method: Bite block Placement Confirmation: positive ETCO2 Tube secured with: Tape Dental Injury: Teeth and Oropharynx as per pre-operative assessment  Comments: LMA placed by Dr.Buck MD/DO, after first attempt by SRNA.

## 2024-08-01 NOTE — Progress Notes (Signed)
 Orthopedic Tech Progress Note Patient Details:  Jaelyn Bourgoin [facility] 06-Jun-1926 [id number]  Ortho Devices Type of Ortho Device: Postop shoe/boot Ortho Device/Splint Location: RLE/fitted left at bedside Ortho Device/Splint Interventions: Ordered, Application, Adjustment   Post Interventions Patient Tolerated: Well  Adine MARLA Blush 08/01/2024, 2:42 PM

## 2024-08-01 NOTE — Progress Notes (Signed)
 Remote PPM Transmission

## 2024-08-01 NOTE — Anesthesia Postprocedure Evaluation (Signed)
 Anesthesia Post Note  Patient: ERICK OXENDINE  Procedure(s) Performed: REMOVAL, HARDWARE (Right) INCISION AND DRAINAGE OF DEEP ABSCESS, CALF (Right)     Patient location during evaluation: PACU Anesthesia Type: General Level of consciousness: awake and alert Pain management: pain level controlled Vital Signs Assessment: post-procedure vital signs reviewed and stable Respiratory status: spontaneous breathing, nonlabored ventilation, respiratory function stable and patient connected to nasal cannula oxygen Cardiovascular status: blood pressure returned to baseline and stable Postop Assessment: no apparent nausea or vomiting Anesthetic complications: no   No notable events documented.  Last Vitals:  Vitals:   08/01/24 1250 08/01/24 1322  BP: (!) 140/55 (!) 158/63  Pulse: 60 93  Resp: 13 16  Temp: 36.4 C 36.4 C  SpO2: 97% 91%    Last Pain:  Vitals:   08/01/24 1250  TempSrc:   PainSc: Asleep                 Lynwood MARLA Cornea

## 2024-08-01 NOTE — Transfer of Care (Signed)
 Immediate Anesthesia Transfer of Care Note  Patient: Grace Robinson  Procedure(s) Performed: REMOVAL, HARDWARE (Right) INCISION AND DRAINAGE OF DEEP ABSCESS, CALF (Right)  Patient Location: PACU  Anesthesia Type:General  Level of Consciousness: awake and alert   Airway & Oxygen Therapy: Patient Spontanous Breathing  Post-op Assessment: Report given to RN and Post -op Vital signs reviewed and stable  Post vital signs: Reviewed and stable  Last Vitals:  Vitals Value Taken Time  BP 163/65 08/01/24 12:15  Temp 36.1 C 08/01/24 12:10  Pulse 64 08/01/24 12:19  Resp 10 08/01/24 12:19  SpO2 98 % 08/01/24 12:19  Vitals shown include unfiled device data.  Last Pain:  Vitals:   08/01/24 1210  TempSrc:   PainSc: 0-No pain         Complications: No notable events documented.

## 2024-08-01 NOTE — Plan of Care (Signed)
   Problem: Education: Goal: Knowledge of General Education information will improve Description: Including pain rating scale, medication(s)/side effects and non-pharmacologic comfort measures Outcome: Progressing   Problem: Activity: Goal: Risk for activity intolerance will decrease Outcome: Progressing   Problem: Nutrition: Goal: Adequate nutrition will be maintained Outcome: Progressing

## 2024-08-01 NOTE — Op Note (Signed)
 08/01/2024  12:01 PM  PATIENT:  Grace Robinson    PRE-OPERATIVE DIAGNOSIS:  Right Ankle Infection with Retained Hardware  POST-OPERATIVE DIAGNOSIS:  Same  PROCEDURE:  REMOVAL DEEP RETAINED HARDWARE, excisional debridement of skin and soft tissue muscle fascia and bone lateral right ankle. Soft tissue sent for cultures. Application Kerecis micro graft 38 cm. Application vancomycin powder 1 g and 40 mg of tobramycin. Local tissue transfer for wound closure 10 x 3 cm.  SURGEON:  Jerona LULLA Sage, MD  PHYSICIAN ASSISTANT:None ANESTHESIA:   General  PREOPERATIVE INDICATIONS:  [name] is a  89 y.o. female with a diagnosis of Right Ankle Infection with Retained Hardware who failed conservative measures and elected for surgical management.    The risks benefits and alternatives were discussed with the patient preoperatively including but not limited to the risks of infection, bleeding, nerve injury, cardiopulmonary complications, the need for revision surgery, among others, and the patient was willing to proceed.  OPERATIVE IMPLANTS:   Implant Name Type Inv. Item Serial No. Manufacturer Lot No. LRB No. Used Action  GRAFT SKIN WND MARIGEN OMEGA3 - [id number] Tissue GRAFT SKIN WND MARIGEN OMEGA3  [employer] [id number] Right 1 Implanted    @ENCIMAGES @  OPERATIVE FINDINGS: Patient did have abscess tissue extending down to the hardware.  The infected tissue was sent for cultures.  After debridement the tissue margins were clear and there was petechial bleeding in the bone.  OPERATIVE PROCEDURE: Patient brought the operating room and underwent a general anesthetic.  After adequate level anesthesia were obtained patient's right lower extremity was prepped using DuraPrep draped into a sterile field a timeout was called.  A elliptical incision was made around the ulcerative tissue to excise the previous incision with the draining abscess.  This was carried sharply down to the hardware.   There was a abscess that communicated with the hardware the infected tissue was sent for cultures.  The interfrag screw and the neutralization plate with securing screws was removed.  A periosteal elevator and Cobb elevator were used to excisionally debride the lateral aspect of the fibula down to bleeding viable petechial bone.  The fracture had healed.  The tissue margins were clear.  Electrocautery was used and used for hemostasis.  The wound was irrigated with Vashe.  The Kerecis micro graft 38 cm was combined with 1 g vancomycin powder and 40 mg of tobramycin and applied to the wound bed.  The tissue margins were undermined to allow for local tissue transfer.  Local tissue transfer was used to close the wound 10 x 4 cm.  Wound closed with 2-0 nylon.  A sterile dressing was applied patient was extubated taken the PACU in stable condition.   DISCHARGE PLANNING:  Antibiotic duration: Antibiotics for 24 hours and then will adjust her antibiotics pending culture sensitivities  Weightbearing: Weightbearing as tolerated in a fracture boot  Pain medication: Opioid pathway  Dressing care/ Wound VAC: Dry dressing change and follow-up  Ambulatory devices: Walker  Discharge to: Anticipate discharge to home  Follow-up: In the office 1 week post operative.

## 2024-08-01 NOTE — Interval H&P Note (Signed)
 History and Physical Interval Note:  08/01/2024 10:55 AM  Grace Robinson  has presented today for surgery, with the diagnosis of Right Ankle Infection with Retained Hardware.  The various methods of treatment have been discussed with the patient and family. After consideration of risks, benefits and other options for treatment, the patient has consented to  Procedure(s) with comments: REMOVAL, HARDWARE (Right) - RIGHT ANKLE REMOVAL OF RETAINED HARDWARE INCISION AND DRAINAGE OF DEEP ABSCESS, CALF (Right) - IRRIGATION AND DEBRIDEMENT RIGHT ANKLE as a surgical intervention.  The patient's history has been reviewed, patient examined, no change in status, stable for surgery.  I have reviewed the patient's chart and labs.  Questions were answered to the patient's satisfaction.     Sarah Zerby V Tay Whitwell

## 2024-08-02 ENCOUNTER — Encounter (HOSPITAL_COMMUNITY): Payer: Self-pay | Admitting: Orthopedic Surgery

## 2024-08-02 DIAGNOSIS — Y792 Prosthetic and other implants, materials and accessory orthopedic devices associated with adverse incidents: Secondary | ICD-10-CM | POA: Diagnosis not present

## 2024-08-02 DIAGNOSIS — T8469XA Infection and inflammatory reaction due to internal fixation device of other site, initial encounter: Secondary | ICD-10-CM | POA: Diagnosis not present

## 2024-08-02 DIAGNOSIS — Z7982 Long term (current) use of aspirin: Secondary | ICD-10-CM | POA: Diagnosis not present

## 2024-08-02 DIAGNOSIS — I110 Hypertensive heart disease with heart failure: Secondary | ICD-10-CM | POA: Diagnosis not present

## 2024-08-02 DIAGNOSIS — I428 Other cardiomyopathies: Secondary | ICD-10-CM | POA: Diagnosis not present

## 2024-08-02 DIAGNOSIS — M86172 Other acute osteomyelitis, left ankle and foot: Secondary | ICD-10-CM | POA: Diagnosis not present

## 2024-08-02 DIAGNOSIS — L97314 Non-pressure chronic ulcer of right ankle with necrosis of bone: Secondary | ICD-10-CM | POA: Diagnosis not present

## 2024-08-02 DIAGNOSIS — I5022 Chronic systolic (congestive) heart failure: Secondary | ICD-10-CM | POA: Diagnosis not present

## 2024-08-02 DIAGNOSIS — E11622 Type 2 diabetes mellitus with other skin ulcer: Secondary | ICD-10-CM | POA: Diagnosis not present

## 2024-08-02 DIAGNOSIS — Z794 Long term (current) use of insulin: Secondary | ICD-10-CM | POA: Diagnosis not present

## 2024-08-02 DIAGNOSIS — I509 Heart failure, unspecified: Secondary | ICD-10-CM | POA: Diagnosis not present

## 2024-08-02 DIAGNOSIS — Z87891 Personal history of nicotine dependence: Secondary | ICD-10-CM | POA: Diagnosis not present

## 2024-08-02 DIAGNOSIS — Z853 Personal history of malignant neoplasm of breast: Secondary | ICD-10-CM | POA: Diagnosis not present

## 2024-08-02 LAB — HEMOGLOBIN A1C
Hgb A1c MFr Bld: 8 % — ABNORMAL HIGH (ref 4.8–5.6)
Mean Plasma Glucose: 182.9 mg/dL

## 2024-08-02 LAB — GLUCOSE, CAPILLARY
Glucose-Capillary: 284 mg/dL — ABNORMAL HIGH (ref 70–99)
Glucose-Capillary: 111 mg/dL — ABNORMAL HIGH (ref 70–99)
Glucose-Capillary: 166 mg/dL — ABNORMAL HIGH (ref 70–99)
Glucose-Capillary: 221 mg/dL — ABNORMAL HIGH (ref 70–99)

## 2024-08-02 MED ORDER — INSULIN ASPART 100 UNIT/ML IJ SOLN: 0.0000 [IU] | Freq: Three times a day (TID) | INTRAMUSCULAR | Status: DC

## 2024-08-02 MED ORDER — INSULIN ASPART 100 UNIT/ML IJ SOLN
0.0000 [IU] | Freq: Three times a day (TID) | INTRAMUSCULAR | Status: DC
  Administered 2024-08-02: 1 [IU] via SUBCUTANEOUS

## 2024-08-02 NOTE — Evaluation (Signed)
 Physical Therapy Evaluation Patient Details Name: Grace Robinson MRN: [id number] DOB: 05/04/1926 Today's Date: 08/02/2024  History of Present Illness  89 y/o female who sustained open fracture of the right ankle on 09/06/23.  The wound was irrigated and then the next day Dr. Harden performed ORIF of the right ankle.  She has done well over all, until 7-10 days ago she has developed erythema and edema with a 0.5 cm open draining wound over the lateral right ankle.  Clinical Impression  Pt presents with admitting diagnosis above. Pt today was able to ambulate in hallway with rollator CGA. PTA pt reports that she is typically Mod I with a rollator. Recommend HHPT upon DC. PT will continue to follow.          If plan is discharge home, recommend the following: A little help with walking and/or transfers;A little help with bathing/dressing/bathroom;Assistance with cooking/housework;Direct supervision/assist for medications management;Assist for transportation;Help with stairs or ramp for entrance;Supervision due to cognitive status   Can travel by private vehicle        Equipment Recommendations None recommended by PT  Recommendations for Other Services       Functional Status Assessment Patient has had a recent decline in their functional status and demonstrates the ability to make significant improvements in function in a reasonable and predictable amount of time.     Precautions / Restrictions Precautions Precautions: Fall Recall of Precautions/Restrictions: Intact Restrictions Weight Bearing Restrictions Per Provider Order: Yes RLE Weight Bearing Per Provider Order: Weight bearing as tolerated Other Position/Activity Restrictions: WBAT in post op shoe      Mobility  Bed Mobility Overal bed mobility: Modified Independent                  Transfers Overall transfer level: Needs assistance Equipment used: Rollator (4 wheels) Transfers: Sit to/from Stand Sit to Stand:  Contact guard assist           General transfer comment: Cues for locking brakes.    Ambulation/Gait Ambulation/Gait assistance: Contact guard assist Gait Distance (Feet): 125 Feet Assistive device: Rollator (4 wheels) Gait Pattern/deviations: Decreased stride length, Step-through pattern Gait velocity: decreased     General Gait Details: slowed step through pattern. no LOB noted.  Stairs            Wheelchair Mobility     Tilt Bed    Modified Rankin (Stroke Patients Only)       Balance Overall balance assessment: Mild deficits observed, not formally tested                                           Pertinent Vitals/Pain Pain Assessment Pain Assessment: No/denies pain    Home Living Family/patient expects to be discharged to:: Assisted living Living Arrangements: Other (Comment)               Home Equipment: Shower seat - built in;Grab bars - tub/shower;Grab bars - toilet;Rollator (4 wheels) Additional [facility]    Prior Function Prior Level of Function : Needs assist;History of Falls (last six months)             Mobility Comments: Uses rollator mod I; recent falls ADLs Comments: Aide comes 2x/week to assist with bathing, goes to dining area for dinner and intermittently for lunch     Extremity/Trunk Assessment   Upper Extremity Assessment Upper  Extremity Assessment: Overall WFL for tasks assessed    Lower Extremity Assessment Lower Extremity Assessment: RLE deficits/detail RLE Deficits / Details: R ankle hardware removal    Cervical / Trunk Assessment Cervical / Trunk Assessment: Kyphotic  Communication   Communication Communication: Impaired Factors Affecting Communication: Hearing impaired    Cognition Arousal: Alert Behavior During Therapy: WFL for tasks assessed/performed   PT - Cognitive impairments: No family/caregiver present to determine baseline                          Following commands: Impaired Following commands impaired: Follows one step commands with increased time, Follows one step commands inconsistently     Cueing Cueing Techniques: Verbal cues, Tactile cues     General Comments General comments (skin integrity, edema, etc.): VSS    Exercises     Assessment/Plan    PT Assessment Patient needs continued PT services  PT Problem List Decreased strength;Decreased range of motion;Decreased activity tolerance;Decreased balance;Decreased mobility;Decreased coordination;Decreased cognition;Decreased knowledge of use of DME;Decreased safety awareness;Cardiopulmonary status limiting activity;Decreased knowledge of precautions       PT Treatment Interventions DME instruction;Gait training;Stair training;Functional mobility training;Therapeutic exercise;Therapeutic activities;Balance training;Cognitive remediation;Neuromuscular re-education;Patient/family education    PT Goals (Current goals can be found in the Care Plan section)  Acute Rehab PT Goals Patient Stated Goal: to go home PT Goal Formulation: With patient Time For Goal Achievement: 08/16/24 Potential to Achieve Goals: Good    Frequency Min 2X/week     Co-evaluation               AM-PAC PT 6 Clicks Mobility  Outcome Measure Help needed turning from your back to your side while in a flat bed without using bedrails?: A Little Help needed moving from lying on your back to sitting on the side of a flat bed without using bedrails?: A Little Help needed moving to and from a bed to a chair (including a wheelchair)?: A Little Help needed standing up from a chair using your arms (e.g., wheelchair or bedside chair)?: A Little Help needed to walk in hospital room?: A Little Help needed climbing 3-5 steps with a railing? : A Little 6 Click Score: 18    End of Session Equipment Utilized During Treatment: Gait belt Activity Tolerance: Patient tolerated treatment well Patient  left: in bed;with call bell/phone within reach (Declined chair) Nurse Communication: Mobility status PT Visit Diagnosis: Other abnormalities of gait and mobility (R26.89)    Time: 8597-8576 PT Time Calculation (min) (ACUTE ONLY): 21 min   Charges:   PT Evaluation $PT Eval Moderate Complexity: 1 Mod   PT General Charges $$ ACUTE PT VISIT: 1 Visit         Sueellen NOVAK, PT, DPT Acute Rehab Services [phone]   Emeri Estill 08/02/2024, 4:13 PM

## 2024-08-02 NOTE — Progress Notes (Signed)
 Patient ID: Grace Robinson, female   DOB: Dec 16, 1925, 89 y.o.   MRN: [id number] Patient is status post removal of infected deep retained hardware.  Cultures are showing gram-positive cocci.  Patient to work with therapy today and [facility].  Will discharge with doxycycline and adjust according to tissue cultures.  The wound bed does have VAC powder and tobramycin mixed with the Kerecis.

## 2024-08-02 NOTE — Plan of Care (Signed)
   Problem: Education: Goal: Knowledge of General Education information will improve Description Including pain rating scale, medication(s)/side effects and non-pharmacologic comfort measures Outcome: Progressing

## 2024-08-02 NOTE — Care Management Obs Status (Signed)
 MEDICARE OBSERVATION STATUS NOTIFICATION   Patient Details  Name: Grace Robinson MRN: [id number] Date of Birth: 03/07/1926   Medicare Observation Status Notification Given:  Yes  Son Fairy also contacted regarding MOON.  Bridget Cordella Simmonds, LCSW 08/02/2024, 11:37 AM

## 2024-08-02 NOTE — Plan of Care (Signed)
  Problem: Education: Goal: Knowledge of General Education information will improve Description: Including pain rating scale, medication(s)/side effects and non-pharmacologic comfort measures Outcome: Progressing   Problem: Health Behavior/Discharge Planning: Goal: Ability to manage health-related needs will improve Outcome: Progressing   Problem: Nutrition: Goal: Adequate nutrition will be maintained Outcome: Progressing   Problem: Coping: Goal: Level of anxiety will decrease Outcome: Progressing   Problem: Elimination: Goal: Will not experience complications related to bowel motility Outcome: Progressing Goal: Will not experience complications related to urinary retention Outcome: Progressing   Problem: Pain Managment: Goal: General experience of comfort will improve and/or be controlled Outcome: Progressing   Problem: Safety: Goal: Ability to remain free from injury will improve Outcome: Progressing

## 2024-08-02 NOTE — Inpatient Diabetes Management (Addendum)
 Inpatient Diabetes Program Recommendations  AACE/ADA: New Consensus Statement on Inpatient Glycemic Control (2015)  Target Ranges:  Prepandial:   less than 140 mg/dL      Peak postprandial:   less than 180 mg/dL (1-2 hours)      Critically ill patients:  140 - 180 mg/dL   Lab Results  Component Value Date   GLUCAP 284 (H) 08/02/2024   HGBA1C 7.5 02/25/2023    Review of Glycemic Control  Latest Reference Range & Units 08/01/24 10:26 08/01/24 12:13 08/01/24 16:30 08/02/24 08:22  Glucose-Capillary 70 - 99 mg/dL 787 (H) 815 (H) 742 (H) 284 (H)  (H): Data is abnormally high  Diabetes history: DM2 Outpatient Diabetes medications: Toujeo  45 units TID, Aspart 10 units TID Current orders for Inpatient glycemic control: Lantus  35 units every day, Novolog  10 units TID  Inpatient Diabetes Program Recommendations:    Novolog  0-6 units TID  Addendum@12 :21  Noon CBG was 111 mg/dL after Novolog  10 units meal coverage fro BF.  Please consider:  Decrease meal coverage TID to Novolog  4 units  Decrease Lantus  to 20 units qd  Thank you, Wyvonna Pinal, MSN, CDCES Diabetes Coordinator Inpatient Diabetes Program [phone] (team pager from 8a-5p)

## 2024-08-02 NOTE — TOC Initial Note (Signed)
 Transition of Care [facility]) - Initial/Assessment Note    Patient Details  Name: Grace Robinson MRN: [id number] Date of Birth: 02/10/1926  [facility]) CM/SW Contact:    Bridget Cordella Simmonds, LCSW Phone Number: 08/02/2024, 11:41 AM  Clinical Narrative:   CSW met with pt for initial assessment.  Pt confirms she is from [facility], assisted living, plans to return.  Permission given to speak with son Fairy, who goes by Ed.  Discussed PT eval pending at this time to determine if STR is needed.    CSW spoke with Ed, who also confirms return to [facility].  MOON also discussed with Ed.  PT eval still pending.                  Expected Discharge Plan:  (TBD) Barriers to Discharge: Continued Medical Work up   Patient Goals and CMS Choice Patient states their goals for this hospitalization and ongoing recovery are:: get back to my room   Choice offered to / list presented to : Patient, Adult Children (son Fairy)      Expected Discharge Plan and Services In-house Referral: Clinical Social Work     Living arrangements for the past 2 months: Assisted Living Facility ([facility])                                      Prior Living Arrangements/Services Living arrangements for the past 2 months: Assisted Living Facility ([facility]) Lives with:: Facility Resident Patient language and need for interpreter reviewed:: Yes Do you feel safe going back to the place where you live?: Yes      Need for Family Participation in Patient Care: Yes (Comment)   Current home services: Other (comment) (none-just finished PT) Criminal Activity/Legal Involvement Pertinent to Current Situation/Hospitalization: No - Comment as needed  Activities of Daily Living   ADL Screening (condition at time of admission) Independently performs ADLs?: Yes (appropriate for developmental age) Is the patient deaf or have difficulty hearing?: No Does the  patient have difficulty seeing, even when wearing glasses/contacts?: No Does the patient have difficulty concentrating, remembering, or making decisions?: No  Permission Sought/Granted Permission sought to share information with : Family Supports Permission granted to share information with : Yes, Verbal Permission Granted  Share Information with NAME: son Fairy           Emotional Assessment Appearance:: Appears stated age Attitude/Demeanor/Rapport: Engaged Affect (typically observed): Appropriate, Pleasant Orientation: : Oriented to Self, Oriented to Place, Oriented to  Time, Oriented to Situation      Admission diagnosis:  Retained orthopedic hardware [Z96.9] Infection [B99.9] Patient Active Problem List   Diagnosis Date Noted   Bone infection, ankle/foot (HCC) 08/01/2024   Hardware complicating wound infection 08/01/2024   Lower limb ulcer, ankle, right, with necrosis of bone (HCC) 08/01/2024   Infection 08/01/2024   Numbness of right hand 11/03/2023   Right calf pain 10/10/2023   Cellulitis 09/26/2023   IBS (irritable bowel syndrome) 09/10/2023   Open wound of right ankle 09/09/2023   Hypokalemia 09/08/2023   Dislocation of ankle, right, open, initial encounter 09/07/2023   Open fracture of right distal fibula 09/06/2023   COVID-19 virus infection 06/09/2023   Gait abnormality 05/19/2023   Osteoarthritis 05/03/2023   Radiation dermatitis 05/03/2023   Osteopenia after menopause 03/04/2023   Closed pelvic fracture (HCC) 02/23/2023  Lower back pain 02/23/2023   Hydronephrosis 02/23/2023   Sigmoid diverticulitis 02/23/2023   Aortic atherosclerosis 02/23/2023   Hyperlipidemia associated with type 2 diabetes mellitus (HCC) 02/23/2023   Vitamin B12 deficiency 02/23/2023   Vitamin D  deficiency 02/23/2023   Type 2 diabetes mellitus with renal complication (HCC) 02/23/2023   S/P lumpectomy, right breast 01/05/2023   Ductal carcinoma in situ (DCIS) of right breast  12/06/2022   NICM (nonischemic cardiomyopathy) (HCC) 07/30/2021   Degenerative tear of posterior horn of medial meniscus of left knee 04/14/2016   Tear of lateral meniscus of left knee 04/14/2016   Synovial plica of left knee 04/14/2016   Mechanical complication of implantable cardioverter-defibrillator (ICD) 01/17/2014   Fall 07/10/2013   Paroxysmal ventricular tachycardia (HCC) 12/13/2012   Biventricular implantable cardioverter-defibrillator -Medtronic 12/08/2011   Atrial tachycardia 12/08/2011   HTN (hypertension) 05/19/2011   CARDIOMYOPATHY, PRIMARY, DILATED 12/01/2010   SYSTOLIC HEART FAILURE, CHRONIC 12/01/2010   PCP:  Mast, Man X, NP Pharmacy:   [facility] [phone] [address], [place] - [address] AT [facility] [address] & [facility] [address] [place] [address] Phone: [phone] Fax: [phone]     Social Drivers of Health (SDOH) Social History: SDOH Screenings   Food Insecurity: No Food Insecurity (08/01/2024)  Housing: Low Risk  (08/01/2024)  Transportation Needs: No Transportation Needs (08/01/2024)  Utilities: Not At Risk (08/01/2024)  Depression (PHQ2-9): Low Risk  (04/18/2024)  Financial Resource Strain: Low Risk  (12/08/2022)  Tobacco Use: Medium Risk (08/01/2024)   SDOH Interventions:     Readmission Risk Interventions     No data to display

## 2024-08-03 ENCOUNTER — Other Ambulatory Visit (HOSPITAL_COMMUNITY): Payer: Self-pay

## 2024-08-03 DIAGNOSIS — T8469XA Infection and inflammatory reaction due to internal fixation device of other site, initial encounter: Secondary | ICD-10-CM | POA: Diagnosis not present

## 2024-08-03 LAB — GLUCOSE, CAPILLARY
Glucose-Capillary: 102 mg/dL — ABNORMAL HIGH (ref 70–99)
Glucose-Capillary: 137 mg/dL — ABNORMAL HIGH (ref 70–99)

## 2024-08-03 MED ORDER — HYDROCODONE-ACETAMINOPHEN 5-325 MG PO TABS
1.0000 | ORAL_TABLET | Freq: Four times a day (QID) | ORAL | 0 refills | Status: DC | PRN
  Filled 2024-08-03: qty 30, 7d supply, fill #0

## 2024-08-03 MED ORDER — DOXYCYCLINE HYCLATE 100 MG PO TABS
100.0000 mg | ORAL_TABLET | Freq: Two times a day (BID) | ORAL | 1 refills | Status: DC
  Filled 2024-08-03: qty 14, 7d supply, fill #0

## 2024-08-03 NOTE — TOC Transition Note (Signed)
 Transition of Care [facility]) - Discharge Note   Patient Details  Name: Grace Robinson MRN: [id number] Date of Birth: 01/14/1926  [facility]) CM/SW Contact:  Bridget Cordella Simmonds, LCSW Phone Number: 08/03/2024, 10:35 AM   Clinical Narrative:   Pt discharging to [facility].  RN call report to [phone].  Son Fairy will transport, will need pt brought down to main north tower entrance with assistance getting into the vehicle.    1000: CSW message to and from [facility], [phone]: they can receive pt today, do need FL2.      Final next level of care: Assisted Living Barriers to Discharge: Barriers Resolved   Patient Goals and CMS Choice Patient states their goals for this hospitalization and ongoing recovery are:: get back to my room   Choice offered to / list presented to : Patient, Adult Children (son Fairy)      Discharge Placement              Patient chooses bed at:  ([facility]) Patient to be transferred to facility by: son Fairy Name of family member notified: son Fairy Patient and family notified of of transfer: 08/03/24  Discharge Plan and Services Additional resources added to the After Visit Summary for   In-house Referral: Clinical Social Work                                   Social Drivers of Health (SDOH) Interventions SDOH Screenings   Food Insecurity: No Food Insecurity (08/01/2024)  Housing: Low Risk  (08/01/2024)  Transportation Needs: No Transportation Needs (08/01/2024)  Utilities: Not At Risk (08/01/2024)  Depression (PHQ2-9): Low Risk  (04/18/2024)  Financial Resource Strain: Low Risk  (12/08/2022)  Social Connections: Patient Unable To Answer (08/03/2024)  Tobacco Use: Medium Risk (08/01/2024)     Readmission Risk Interventions     No data to display

## 2024-08-03 NOTE — Progress Notes (Signed)
 AVS Reviewed and placed in DC packet.  Abx and pain medications delivered to family from [facility].  Report called to [facility].  Patient's family is in the room and ready to take patient to receiving facility.

## 2024-08-03 NOTE — Progress Notes (Signed)
 [facility]    POD# 2 removal of infected ankle hardware right LE  Final cultures pending with  RARE GRAM POSITIVE COCCI IN PAIRS   Culture FEW STAPHYLOCOCCUS AUREUS   Dressing clean and dr, post op shoe in room. She did well with PT and no PT follow up is recommended.   Plan for discharge on Doxycycline po BID 100 mg.  We will follow cultures and make antibiotic adjustments as needed.  F/U in our office in 1 week. Stable for discharge today back to assisted living.  Maurilio Deland Collet PA-C [phone]

## 2024-08-03 NOTE — Plan of Care (Signed)

## 2024-08-03 NOTE — Discharge Instructions (Addendum)
 Keep right LE dressing clean and dry.  Elevate when at rest Walk as needed with post op shoe on the right F/U in 1 week

## 2024-08-03 NOTE — Discharge Summary (Signed)
 Physician Discharge Summary  Patient ID: Grace Robinson MRN: [id number] DOB/AGE: 88/06/1926 88 y.o.  Admit date: 08/01/2024 Discharge date: 08/03/2024  Admission Diagnoses:  Principal Problem:   Infection Active Problems:   Bone infection, ankle/foot (HCC)   Hardware complicating wound infection   Lower limb ulcer, ankle, right, with necrosis of bone [facility])   Discharge Diagnoses:  Same  Past Medical History:  Diagnosis Date   Biventricular ICD (implantable cardiac defibrillator) in place    downgraded from CRT-D to CRT-P 08/30/22   Breast cancer (HCC)    Cellulitis    Change in bowel habits    CHF (congestive heart failure) (HCC)    Colon polyps    Diabetes mellitus    Type II   Diarrhea    Dyslipidemia    HOH (hard of hearing)    left   HTN (hypertension)    LBBB (left bundle branch block)    chronic   Meniere's disease    Nonischemic cardiomyopathy (HCC) 2006   Presence of permanent cardiac pacemaker    T wave oversensing 01/17/2014   Ulcerative colitis    Varicose veins    4 lazer  treatment each leg    Surgeries: Procedure(s): REMOVAL, HARDWARE INCISION AND DRAINAGE OF DEEP ABSCESS, CALF on 08/01/2024   Consultants:   Discharged Condition: Improved  Hospital Course: HILDEGARD HLAVAC is an 89 y.o. female who was admitted 08/01/2024 with a chief complaint of No chief complaint on file. , and found to have a diagnosis of Infection.  They were brought to the operating room on 08/01/2024 and underwent the above named procedures.    They were given perioperative antibiotics:  Anti-infectives (From admission, onward)    Start     Dose/Rate Route Frequency Ordered Stop   08/03/24 0000  doxycycline (VIBRA-TABS) 100 MG tablet        100 mg Oral 2 times daily 08/03/24 0911     08/01/24 1700  ceFAZolin  (ANCEF ) IVPB 2g/100 mL premix        2 g 200 mL/hr over 30 Minutes Intravenous Every 6 hours 08/01/24 1339 08/02/24 0130   08/01/24 1141  tobramycin (NEBCIN) injection   Status:  Discontinued          As needed 08/01/24 1142 08/01/24 1208   08/01/24 1132  vancomycin (VANCOCIN) powder  Status:  Discontinued          As needed 08/01/24 1132 08/01/24 1208   08/01/24 1030  ceFAZolin  (ANCEF ) IVPB 2g/100 mL premix        2 g 200 mL/hr over 30 Minutes Intravenous On call to O.R. 08/01/24 1015 08/01/24 1125     .  They were given compression stockings, early ambulation, and chemoprophylaxis for DVT prophylaxis.  They benefited maximally from their hospital stay and there were no complications.    Recent vital signs:  Vitals:   08/03/24 0607 08/03/24 0836  BP: (!) 121/45 (!) 157/58  Pulse: 63 68  Resp: 15 16  Temp: 97.8 F (36.6 C) 98.3 F (36.8 C)  SpO2: 100% 98%    Recent laboratory studies:  Results for orders placed or performed during the hospital encounter of 08/01/24  Glucose, capillary   Collection Time: 08/01/24 10:26 AM  Result Value Ref Range   Glucose-Capillary 212 (H) 70 - 99 mg/dL  I-STAT, chem 8   Collection Time: 08/01/24 10:43 AM  Result Value Ref Range   Sodium 139 135 - 145 mmol/L   Potassium 4.0 3.5 - 5.1  mmol/L   Chloride 102 98 - 111 mmol/L   BUN 27 (H) 8 - 23 mg/dL   Creatinine, Ser 8.79 (H) 0.44 - 1.00 mg/dL   Glucose, Bld 790 (H) 70 - 99 mg/dL   Calcium , Ion 1.08 (L) 1.15 - 1.40 mmol/L   TCO2 25 22 - 32 mmol/L   Hemoglobin 11.2 (L) 12.0 - 15.0 g/dL   HCT 66.9 (L) 63.9 - 53.9 %  Aerobic/Anaerobic Culture w Gram Stain (surgical/deep wound)   Collection Time: 08/01/24 11:32 AM   Specimen: Soft Tissue, Other  Result Value Ref Range   Specimen Description TISSUE    Special Requests RIGHT ANKLE    Gram Stain      RARE WBC PRESENT, PREDOMINANTLY PMN RARE GRAM POSITIVE COCCI IN PAIRS Performed at [facility], [address], [place], [place] [address]    Culture FEW STAPHYLOCOCCUS AUREUS    Report Status PENDING    Organism ID, Bacteria STAPHYLOCOCCUS AUREUS       Susceptibility   Staphylococcus aureus -  MIC*    CIPROFLOXACIN <=0.5 SENSITIVE Sensitive     ERYTHROMYCIN >=8 RESISTANT Resistant     GENTAMICIN  <=0.5 SENSITIVE Sensitive     OXACILLIN 0.5 SENSITIVE Sensitive     TETRACYCLINE <=1 SENSITIVE Sensitive     VANCOMYCIN 1 SENSITIVE Sensitive     TRIMETH/SULFA <=10 SENSITIVE Sensitive     CLINDAMYCIN RESISTANT Resistant     RIFAMPIN <=0.5 SENSITIVE Sensitive     Inducible Clindamycin POSITIVE Resistant     LINEZOLID 2 SENSITIVE Sensitive     * FEW STAPHYLOCOCCUS AUREUS  Glucose, capillary   Collection Time: 08/01/24 12:13 PM  Result Value Ref Range   Glucose-Capillary 184 (H) 70 - 99 mg/dL  Glucose, capillary   Collection Time: 08/01/24  4:30 PM  Result Value Ref Range   Glucose-Capillary 257 (H) 70 - 99 mg/dL  CBC   Collection Time: 08/01/24  6:47 PM  Result Value Ref Range   WBC 7.3 4.0 - 10.5 K/uL   RBC 3.37 (L) 3.87 - 5.11 MIL/uL   Hemoglobin 10.8 (L) 12.0 - 15.0 g/dL   HCT 67.7 (L) 63.9 - 53.9 %   MCV 95.5 80.0 - 100.0 fL   MCH 32.0 26.0 - 34.0 pg   MCHC 33.5 30.0 - 36.0 g/dL   RDW 86.9 88.4 - 84.4 %   Platelets 197 150 - 400 K/uL   nRBC 0.0 0.0 - 0.2 %  Creatinine, serum   Collection Time: 08/01/24  6:47 PM  Result Value Ref Range   Creatinine, Ser 1.46 (H) 0.44 - 1.00 mg/dL   GFR, Estimated 33 (L) >60 mL/min  Glucose, capillary   Collection Time: 08/02/24  8:22 AM  Result Value Ref Range   Glucose-Capillary 284 (H) 70 - 99 mg/dL  Glucose, capillary   Collection Time: 08/02/24 11:45 AM  Result Value Ref Range   Glucose-Capillary 111 (H) 70 - 99 mg/dL  Hemoglobin J8r   Collection Time: 08/02/24  1:07 PM  Result Value Ref Range   Hgb A1c MFr Bld 8.0 (H) 4.8 - 5.6 %   Mean Plasma Glucose 182.9 mg/dL  Glucose, capillary   Collection Time: 08/02/24  4:49 PM  Result Value Ref Range   Glucose-Capillary 166 (H) 70 - 99 mg/dL  Glucose, capillary   Collection Time: 08/02/24  7:42 PM  Result Value Ref Range   Glucose-Capillary 221 (H) 70 - 99 mg/dL  Glucose,  capillary   Collection Time: 08/03/24 12:28  AM  Result Value Ref Range   Glucose-Capillary 102 (H) 70 - 99 mg/dL  Glucose, capillary   Collection Time: 08/03/24  6:05 AM  Result Value Ref Range   Glucose-Capillary 137 (H) 70 - 99 mg/dL    Discharge Medications:   Allergies as of 08/03/2024       Reactions   Coreg [carvedilol] Other (See Comments)   Unknown reaction   Firvanq [vancomycin] Itching, Other (See Comments)   Red Man Syndrome   Lactose Intolerance (gi)    Lanoxin [digoxin] Other (See Comments)   DIG toxicity   Sudafed [pseudoephedrine] Other (See Comments)   Makes skin feel like its crawling        Medication List     TAKE these medications    acetaminophen  500 MG tablet Commonly known as: TYLENOL  Take 500 mg by mouth every 8 (eight) hours as needed for headache.   aspirin  EC 81 MG tablet Take 81 mg by mouth daily.   atorvastatin  40 MG tablet Commonly known as: LIPITOR TAKE 1 TABLET(40 MG) BY MOUTH DAILY   doxycycline 100 MG tablet Commonly known as: VIBRA-TABS Take 1 tablet (100 mg total) by mouth 2 (two) times daily.   furosemide  40 MG tablet Commonly known as: LASIX  Take 40 mg by mouth 2 (two) times daily.   hydrALAZINE  50 MG tablet Commonly known as: APRESOLINE  Take 50 mg by mouth 2 (two) times daily.   HYDROcodone -acetaminophen  5-325 MG tablet Commonly known as: NORCO/VICODIN Take 1 tablet by mouth every 6 (six) hours as needed for moderate pain (pain score 4-6) or severe pain (pain score 7-10).   insulin  aspart 100 UNIT/ML injection Commonly known as: novoLOG  Inject 3 Units into the skin 3 (three) times daily before meals. May give additional units as needed, see separate PRN order.   insulin  aspart 100 UNIT/ML injection Commonly known as: novoLOG  Inject 10 Units into the skin 3 (three) times daily as needed for high blood sugar. Give with regular 3 unit dose if BG > 400   loperamide  2 MG tablet Commonly known as: IMODIUM  A-D Take 2  mg by mouth every 6 (six) hours as needed for diarrhea or loose stools.   metoprolol  succinate 100 MG 24 hr tablet Commonly known as: TOPROL -XL TAKE 1 TABLET(100 MG) BY MOUTH TWICE DAILY What changed: See the new instructions.   olmesartan  40 MG tablet Commonly known as: BENICAR  TAKE 1 TABLET(40 MG) BY MOUTH DAILY   ondansetron  4 MG tablet Commonly known as: ZOFRAN  Take 4 mg by mouth every 6 (six) hours as needed.   polyethylene glycol 17 g packet Commonly known as: MIRALAX  / GLYCOLAX  Take 17 g by mouth daily as needed for moderate constipation.   potassium chloride  10 MEQ tablet Commonly known as: KLOR-CON  Take 20 mEq by mouth 2 (two) times daily.   risedronate 35 MG tablet Commonly known as: ACTONEL Take 35 mg by mouth every Sunday. Give 1 tablet by mouth one time a day every Sun for post menopause   saccharomyces boulardii 250 MG capsule Commonly known as: FLORASTOR Take 250 mg by mouth daily.   Toujeo  Max SoloStar 300 UNIT/ML Solostar Pen Generic drug: insulin  glargine (2 Unit Dial) ADMINISTER 35 UNITS UNDER THE SKIN DAILY What changed: See the new instructions.   Voltaren  Arthritis Pain 1 % Gel Generic drug: diclofenac  Sodium Apply topically every 8 (eight) hours as needed (Apply to bilateral knees).        Diagnostic Studies: No results found.  Disposition: Discharge  disposition: 01-Home or Self Care       Discharge Instructions     Call MD for:  redness, tenderness, or signs of infection (pain, swelling, bleeding, redness, odor or green/yellow discharge around incision site)   Complete by: As directed    Call MD for:  severe or increased pain, loss or decreased feeling  in affected limb(s)   Complete by: As directed    Call MD for:  temperature >100.5   Complete by: As directed    Discharge instructions   Complete by: As directed    WBAT keep dressing dry f/u in 1 week   Resume previous diet   Complete by: As directed         Follow-up  Information     Harden Jerona GAILS, MD Follow up in 1 week(s).   Specialty: Orthopedic Surgery Contact information: [address] [place] [address] [phone]                  Signed: Maurilio Deland Collet 08/03/2024, 10:19 AM

## 2024-08-03 NOTE — NC FL2 (Addendum)
 [place]  MEDICAID FL2 LEVEL OF CARE FORM     IDENTIFICATION  Patient Name: Grace Robinson Birthdate: 01/23/1926 Sex: female Admission Date (Current Location): 08/01/2024  [facility] and [place] Number:  Producer, television/film/video and Address:  [facility], [address], [place], [place] [address]      Provider Number: [id number]  Attending Physician Name and Address:  Harden Jerona GAILS, MD  Relative Name and Phone Number:  Teya, Otterson [phone]  [phone]    Current Level of Care: Hospital Recommended Level of Care: Assisted Living Facility ([facility]) Prior Approval Number:    Date Approved/Denied:   PASRR Number:    Discharge Plan: Other (Comment) ([facility])    Current Diagnoses: Patient Active Problem List   Diagnosis Date Noted   Bone infection, ankle/foot (HCC) 08/01/2024   Hardware complicating wound infection 08/01/2024   Lower limb ulcer, ankle, right, with necrosis of bone (HCC) 08/01/2024   Infection 08/01/2024   Numbness of right hand 11/03/2023   Right calf pain 10/10/2023   Cellulitis 09/26/2023   IBS (irritable bowel syndrome) 09/10/2023   Open wound of right ankle 09/09/2023   Hypokalemia 09/08/2023   Dislocation of ankle, right, open, initial encounter 09/07/2023   Open fracture of right distal fibula 09/06/2023   COVID-19 virus infection 06/09/2023   Gait abnormality 05/19/2023   Osteoarthritis 05/03/2023   Radiation dermatitis 05/03/2023   Osteopenia after menopause 03/04/2023   Closed pelvic fracture (HCC) 02/23/2023   Lower back pain 02/23/2023   Hydronephrosis 02/23/2023   Sigmoid diverticulitis 02/23/2023   Aortic atherosclerosis 02/23/2023   Hyperlipidemia associated with type 2 diabetes mellitus (HCC) 02/23/2023   Vitamin B12 deficiency 02/23/2023   Vitamin D  deficiency 02/23/2023   Type 2 diabetes mellitus with renal complication (HCC) 02/23/2023   S/P lumpectomy,  right breast 01/05/2023   Ductal carcinoma in situ (DCIS) of right breast 12/06/2022   NICM (nonischemic cardiomyopathy) (HCC) 07/30/2021   Degenerative tear of posterior horn of medial meniscus of left knee 04/14/2016   Tear of lateral meniscus of left knee 04/14/2016   Synovial plica of left knee 04/14/2016   Mechanical complication of implantable cardioverter-defibrillator (ICD) 01/17/2014   Fall 07/10/2013   Paroxysmal ventricular tachycardia (HCC) 12/13/2012   Biventricular implantable cardioverter-defibrillator -Medtronic 12/08/2011   Atrial tachycardia 12/08/2011   HTN (hypertension) 05/19/2011   CARDIOMYOPATHY, PRIMARY, DILATED 12/01/2010   SYSTOLIC HEART FAILURE, CHRONIC 12/01/2010    Orientation RESPIRATION BLADDER Height & Weight     Self, Time, Situation, Place  Normal Incontinent Weight: 131 lb (59.4 kg) Height:  5' 6 (167.6 cm)  BEHAVIORAL SYMPTOMS/MOOD NEUROLOGICAL BOWEL NUTRITION STATUS        Diet (diabetic diet)  AMBULATORY STATUS COMMUNICATION OF NEEDS Skin   Limited Assist Verbally Surgical wounds                       Personal Care Assistance Level of Assistance  Bathing, Feeding, Dressing Bathing Assistance: Limited assistance Feeding assistance: Limited assistance Dressing Assistance: Limited assistance     Functional Limitations Info             SPECIAL CARE FACTORS FREQUENCY  PT (By licensed PT), OT (By licensed OT)     PT Frequency: evaluate and treat OT Frequency: evaluate and treat            Contractures Contractures Info: Not present    Additional Factors Info  Code Status,  Allergies, Insulin  Sliding Scale Code Status Info: full Allergies Info: Coreg (Carvedilol), Firvanq (Vancomycin), Lactose Intolerance (Gi), Lanoxin (Digoxin), Sudafed (Pseudoephedrine)   Insulin  Sliding Scale Info: Novolog : see discharge summary       Current Medications (08/03/2024):  This is the current hospital active medication list Current  Facility-Administered Medications  Medication Dose Route Frequency Provider Last Rate Last Admin   acetaminophen  (TYLENOL ) tablet 325-650 mg  325-650 mg Oral Q6H PRN Gerome Maurilio HERO, PA-C   650 mg at 08/02/24 8161   acetaminophen  (TYLENOL ) tablet 500 mg  500 mg Oral Q8H PRN Gerome Maurilio HERO, PA-C   500 mg at 08/03/24 9140   aspirin  EC tablet 81 mg  81 mg Oral Daily Gerome Maurilio M, PA-C   81 mg at 08/03/24 0856   atorvastatin  (LIPITOR) tablet 40 mg  40 mg Oral Daily Gerome Maurilio M, PA-C   40 mg at 08/03/24 9142   diclofenac  Sodium (VOLTAREN ) 1 % topical gel 2 g  2 g Topical Q8H PRN Gerome Maurilio M, PA-C       docusate sodium  (COLACE) capsule 100 mg  100 mg Oral BID Gerome Maurilio M, PA-C   100 mg at 08/02/24 2108   enoxaparin (LOVENOX) injection 30 mg  30 mg Subcutaneous Q24H Reome, Earle J, RPH   30 mg at 08/02/24 2050   furosemide  (LASIX ) tablet 40 mg  40 mg Oral BID Gerome Maurilio HERO, PA-C   40 mg at 08/03/24 9143   hydrALAZINE  (APRESOLINE ) tablet 50 mg  50 mg Oral BID Reome, Earle J, RPH   50 mg at 08/03/24 0856   HYDROcodone -acetaminophen  (NORCO) 7.5-325 MG per tablet 1-2 tablet  1-2 tablet Oral Q4H PRN Gerome Maurilio HERO, PA-C       HYDROcodone -acetaminophen  (NORCO/VICODIN) 5-325 MG per tablet 1-2 tablet  1-2 tablet Oral Q4H PRN Gerome Maurilio HERO, PA-C   1 tablet at 08/01/24 2114   insulin  aspart (novoLOG ) injection 0-6 Units  0-6 Units Subcutaneous TID WC Harden Jerona GAILS, MD   1 Units at 08/02/24 1723   insulin  aspart (novoLOG ) injection 10 Units  10 Units Subcutaneous TID THEOPOLIS Gerome Maurilio HERO, PA-C   10 Units at 08/03/24 0857   insulin  glargine (LANTUS ) injection 36 Units  36 Units Subcutaneous Daily Reome, Earle J, RPH   36 Units at 08/03/24 9141   loperamide  (IMODIUM ) capsule 2 mg  2 mg Oral Q6H PRN Gerome Maurilio M, PA-C       metoCLOPramide  (REGLAN ) tablet 5-10 mg  5-10 mg Oral Q8H PRN Gerome Maurilio M, PA-C       Or   metoCLOPramide  (REGLAN ) injection 5-10 mg  5-10 mg Intravenous Q8H PRN Gerome Maurilio M, PA-C       metoprolol  succinate (TOPROL -XL) 24 hr tablet 100 mg  100 mg Oral BID Gerome Maurilio M, PA-C   100 mg at 08/03/24 9145   morphine  (PF) 2 MG/ML injection 0.5-1 mg  0.5-1 mg Intravenous Q2H PRN Gerome Maurilio M, PA-C       ondansetron  (ZOFRAN ) tablet 4 mg  4 mg Oral Q6H PRN Gerome Maurilio M, PA-C       Or   ondansetron  (ZOFRAN ) injection 4 mg  4 mg Intravenous Q6H PRN Gerome Maurilio M, PA-C       polyethylene glycol (MIRALAX  / GLYCOLAX ) packet 17 g  17 g Oral Daily PRN Gerome Maurilio HERO, PA-C   17 g at 08/02/24 2327   potassium chloride  (KLOR-CON  M) CR tablet 20 mEq  20  mEq Oral BID Gerome Herring M, PA-C   20 mEq at 08/03/24 9144     Discharge Medications: Please see discharge summary for a list of discharge medications.  Relevant Imaging Results:  Relevant Lab Results:   Additional Information [id number]  Bridget Cordella Simmonds, LCSW

## 2024-08-06 ENCOUNTER — Non-Acute Institutional Stay: Payer: Self-pay | Admitting: Sports Medicine

## 2024-08-06 DIAGNOSIS — I5022 Chronic systolic (congestive) heart failure: Secondary | ICD-10-CM | POA: Diagnosis not present

## 2024-08-06 DIAGNOSIS — M869 Osteomyelitis, unspecified: Secondary | ICD-10-CM

## 2024-08-06 DIAGNOSIS — E785 Hyperlipidemia, unspecified: Secondary | ICD-10-CM | POA: Diagnosis not present

## 2024-08-06 DIAGNOSIS — Z79899 Other long term (current) drug therapy: Secondary | ICD-10-CM

## 2024-08-06 DIAGNOSIS — E1169 Type 2 diabetes mellitus with other specified complication: Secondary | ICD-10-CM | POA: Diagnosis not present

## 2024-08-06 DIAGNOSIS — Z794 Long term (current) use of insulin: Secondary | ICD-10-CM

## 2024-08-06 DIAGNOSIS — M159 Polyosteoarthritis, unspecified: Secondary | ICD-10-CM

## 2024-08-06 LAB — AEROBIC/ANAEROBIC CULTURE W GRAM STAIN (SURGICAL/DEEP WOUND)

## 2024-08-06 NOTE — Progress Notes (Unsigned)
 Provider:  Dr. Jackalyn Blazing Location:  [facility] Place of Service:   Assisted living   PCP: Mast, Man X, NP Patient Care Team: Mast, Man X, NP as PCP - General (Internal Medicine) Fernande Elspeth BROCKS, MD as PCP - Electrophysiology (Cardiology) Vernetta Berg, MD as Consulting Physician (General Surgery) Loretha Ash, MD as Consulting Physician (Hematology and Oncology) Dewey Rush, MD as Consulting Physician (Radiation Oncology)  Extended Emergency Contact Information Primary Emergency Contact: Drury Fairy BRAVO Address: [address]          [place], [place] [address] [place] Home Phone: [phone] Mobile Phone: [phone] Relation: Son  Goals of Care: Advanced Directive information    08/01/2024   10:49 AM  Advanced Directives  Does Patient Have a Medical Advance Directive? No  Would patient like information on creating a medical advance directive? No - Patient declined      No chief complaint on file.       History of Present Illness  89 yr old F with h/o DM, CHF, HTN is seen today for hospital follow up after his recent hospitalization for infected hardware.  Pt seen and examined in her room  Denies pain in her ankle, has appt nect weel  Denies chest pain, sob, abdominal pain, nausea, vomiting,dysuria, hematuria, bloody or dark stools. She is upset that she is on so many meds and wants to cut down on her medications  As per d/c summary  Surgeries: Procedure(s): REMOVAL, HARDWARE INCISION AND DRAINAGE OF DEEP ABSCESS, CALF on 08/01/2024     Past Medical History:  Diagnosis Date   Biventricular ICD (implantable cardiac defibrillator) in place    downgraded from CRT-D to CRT-P 08/30/22   Breast cancer (HCC)    Cellulitis    Change in bowel habits    CHF (congestive heart failure) (HCC)    Colon polyps    Diabetes mellitus    Type II   Diarrhea    Dyslipidemia    HOH (hard of hearing)    left   HTN  (hypertension)    LBBB (left bundle branch block)    chronic   Meniere's disease    Nonischemic cardiomyopathy (HCC) 2006   Presence of permanent cardiac pacemaker    T wave oversensing 01/17/2014   Ulcerative colitis    Varicose veins    4 lazer  treatment each leg   Past Surgical History:  Procedure Laterality Date   ABDOMINAL HYSTERECTOMY     BIV PACEMAKER GENERATOR CHANGEOUT N/A 08/30/2022   Procedure: BIV PACEMAKER GENERATOR CHANGEOUT;  Surgeon: Fernande Elspeth BROCKS, MD;  Location: [facility] INVASIVE CV LAB;  Service: Cardiovascular;  Laterality: N/A;   BREAST LUMPECTOMY WITH RADIOACTIVE SEED LOCALIZATION Right 01/05/2023   Procedure: RIGHT BREAST BRACKETED LUMPECTOMY WITH RADIOACTIVE SEED LOCALIZATION;  Surgeon: Vernetta Berg, MD;  Location: [facility] OR;  Service: General;  Laterality: Right;   CARDIAC CATHETERIZATION  08/2011   CAST APPLICATION Right 09/06/2023   Procedure: CAST APPLICATION;  Surgeon: Jerri Kay HERO, MD;  Location: [facility] OR;  Service: Orthopedics;  Laterality: Right;   CHOLECYSTECTOMY     COLONOSCOPY     EP Study  08/22/08   HARDWARE REMOVAL Right 08/01/2024   Procedure: REMOVAL, HARDWARE;  Surgeon: Harden Jerona GAILS, MD;  Location: [facility] OR;  Service: Orthopedics;  Laterality: Right;  RIGHT ANKLE REMOVAL OF RETAINED HARDWARE   I & D EXTREMITY Right 09/06/2023   Procedure: IRRIGATION AND DEBRIDEMENT RIGHT ANKLE;  Surgeon: Jerri Kay HERO, MD;  Location: [facility] OR;  Service: Orthopedics;  Laterality: Right;   IMPLANTABLE CARDIOVERTER DEFIBRILLATOR GENERATOR CHANGE N/A 08/22/2013   Procedure: IMPLANTABLE CARDIOVERTER DEFIBRILLATOR GENERATOR CHANGE;  Surgeon: Elspeth JAYSON Sage, MD;  Location: [facility] CATH LAB;  Service: Cardiovascular;  Laterality: N/A;   INCISION AND DRAINAGE OF DEEP ABSCESS, CALF Right 08/01/2024   Procedure: INCISION AND DRAINAGE OF DEEP ABSCESS, CALF;  Surgeon: Harden Jerona GAILS, MD;  Location: [facility] OR;  Service: Orthopedics;  Laterality: Right;  IRRIGATION AND DEBRIDEMENT RIGHT ANKLE   KNEE  ARTHROSCOPY Left 04/14/2016   Procedure: LEFT KNEE ARTHROSCOPY, PARTIAL MEDIAL AND PARTIAL LATERAL MENISCECTOMY, MEDIAL PLICA;  Surgeon: Norleen Gavel, MD;  Location: [facility] OR;  Service: Orthopedics;  Laterality: Left;   mastoid sugery     in the setting of Meniere's disease   ORIF ANKLE FRACTURE Right 09/09/2023   Procedure: OPEN REDUCTION INTERNAL FIXATION (ORIF) ANKLE FRACTURE;  Surgeon: Harden Jerona GAILS, MD;  Location: [facility] OR;  Service: Orthopedics;  Laterality: Right;   TONSILLECTOMY AND ADENOIDECTOMY      reports that she has quit smoking. Her smoking use included cigarettes. She has never been exposed to tobacco smoke. She has never used smokeless tobacco. She reports that she does not drink alcohol and does not use drugs. 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    Social Drivers of Corporate investment banker Strain: Low Risk  (12/08/2022)   Overall Financial Resource Strain (CARDIA)    Difficulty of Paying Living Expenses: Not hard at all  Food Insecurity: No Food Insecurity (08/01/2024)   Hunger Vital Sign    Worried About Running Out of Food in the Last Year: Never true    Ran Out of Food in the Last Year: Never true  Transportation Needs: No Transportation Needs (08/01/2024)   PRAPARE - Doctor, general practice (Medical): No    Lack of Transportation (Non-Medical): No  Physical Activity: Not on file  Stress: Not on file  Social Connections: Patient Unable To Answer (08/03/2024)   Social Connection and Isolation Panel    Frequency of Communication with Friends and Family: Patient unable to answer    Frequency of Social Gatherings with Friends and Family: Patient unable to answer    Attends Religious Services: Patient unable to answer    Active Member of Clubs or Organizations: Patient unable to answer    Attends Banker Meetings: Patient unable to answer    Marital Status: Patient unable to answer  Intimate Partner Violence: Not At Risk (08/01/2024)   Humiliation, Afraid, Rape, and Kick questionnaire    Fear of Current or Ex-Partner: No    Emotionally Abused: No  Physically Abused: No    Sexually Abused: No    Functional Status Survey:    Family History  Problem Relation Age of Onset   Breast cancer Mother 11       met to lungs   Emphysema Father 58   Heart disease Father    Healthy Child    Lung cancer Cousin        maternal first cousin, she smoked   Colon cancer Neg Hx    Rectal cancer Neg Hx    Stomach cancer Neg Hx    Liver cancer Neg Hx    Esophageal cancer Neg Hx     Health Maintenance  Topic Date Due   Mammogram  Never done   Zoster Vaccines- Shingrix (2 of 2) 05/02/2023   Influenza Vaccine  06/01/2024   COVID-19 Vaccine (7 - 2025-26 season) 07/02/2024   OPHTHALMOLOGY EXAM  07/04/2024   HEMOGLOBIN A1C  01/31/2025   FOOT EXAM  04/18/2025   Medicare Annual Wellness (AWV)  04/18/2025   DTaP/Tdap/Td (3 - Td or Tdap) 09/05/2033   Pneumococcal Vaccine: 50+ Years  Completed   DEXA SCAN  Completed   Meningococcal B Vaccine  Aged Out    Allergies  Allergen Reactions   Coreg [Carvedilol] Other (See Comments)    Unknown reaction   Firvanq [Vancomycin] Itching and Other (See Comments)    Red Man Syndrome   Lactose Intolerance (Gi)     Lanoxin [Digoxin] Other (See Comments)    DIG toxicity   Sudafed [Pseudoephedrine] Other (See Comments)    Makes skin feel like its crawling     Outpatient Encounter Medications as of 08/06/2024  Medication Sig   acetaminophen  (TYLENOL ) 500 MG tablet Take 500 mg by mouth every 8 (eight) hours as needed for headache.   aspirin  EC 81 MG tablet Take 81 mg by mouth daily.   atorvastatin  (LIPITOR) 40 MG tablet TAKE 1 TABLET(40 MG) BY MOUTH DAILY   diclofenac  Sodium (VOLTAREN  ARTHRITIS PAIN) 1 % GEL Apply topically every 8 (eight) hours as needed (Apply to bilateral knees).   doxycycline (VIBRA-TABS) 100 MG tablet Take 1 tablet (100 mg total) by mouth 2 (two) times daily.   furosemide  (LASIX ) 40 MG tablet Take 40 mg by mouth 2 (two) times daily.   hydrALAZINE  (APRESOLINE ) 50 MG tablet Take 50 mg by mouth 2 (two) times daily.   HYDROcodone -acetaminophen  (NORCO/VICODIN) 5-325 MG tablet Take 1 tablet by mouth every 6 (six) hours as needed for moderate pain (pain score 4-6) or severe pain (pain score 7-10).   insulin  aspart (NOVOLOG ) 100 UNIT/ML injection Inject 3 Units into the skin 3 (three) times daily before meals. May give additional units as needed, see separate PRN order.   insulin  aspart (NOVOLOG ) 100 UNIT/ML injection Inject 10 Units into the skin 3 (three) times daily as needed for high blood sugar. Give with regular 3 unit dose if BG > 400   loperamide  (IMODIUM  A-D) 2 MG tablet Take 2 mg by mouth every 6 (six) hours as needed for diarrhea or loose stools.   metoprolol  succinate (TOPROL -XL) 100 MG 24 hr tablet TAKE 1 TABLET(100 MG) BY MOUTH TWICE DAILY (Patient taking differently: Take 100 mg by mouth daily.)   olmesartan  (BENICAR ) 40 MG tablet TAKE 1 TABLET(40 MG) BY MOUTH DAILY   ondansetron  (ZOFRAN ) 4 MG tablet Take 4 mg by mouth every 6 (six) hours as needed.   polyethylene glycol (MIRALAX  / GLYCOLAX ) 17 g packet Take 17 g by mouth daily  as needed for moderate constipation.   potassium  chloride (KLOR-CON ) 10 MEQ tablet Take 20 mEq by mouth 2 (two) times daily.   risedronate (ACTONEL) 35 MG tablet Take 35 mg by mouth every Sunday. Give 1 tablet by mouth one time a day every Sun for post menopause   saccharomyces boulardii (FLORASTOR) 250 MG capsule Take 250 mg by mouth daily.   TOUJEO  MAX SOLOSTAR 300 UNIT/ML Solostar Pen ADMINISTER 35 UNITS UNDER THE SKIN DAILY (Patient taking differently: Inject 23-45 Units into the skin daily. If BG > 70 and food intake is normal, inject 45 units subcutaneously once daily. If BG < 70 or if patient eats less than 50% of meals, give 23 units instead.)   No facility-administered encounter medications on file as of 08/06/2024.    Review of Systems  Constitutional:  Negative for fever.  HENT:  Negative for sore throat.   Respiratory:  Negative for shortness of breath and wheezing.   Cardiovascular:  Negative for chest pain, palpitations and leg swelling.  Gastrointestinal:  Negative for blood in stool, constipation, diarrhea, nausea and vomiting.  Genitourinary:  Negative for dysuria.  Neurological:  Negative for dizziness.   Negative unless indicated in HPI.  There were no vitals filed for this visit. There is no height or weight on file to calculate BMI. BP Readings from Last 3 Encounters:  08/03/24 (!) 157/58  07/30/24 100/60  07/25/24 130/72   Wt Readings from Last 3 Encounters:  08/01/24 131 lb (59.4 kg)  07/30/24 132 lb 4.8 oz (60 kg)  07/25/24 132 lb 3.2 oz (60 kg)   Physical Exam Constitutional:      Appearance: Normal appearance.  HENT:     Head: Normocephalic and atraumatic.  Cardiovascular:     Rate and Rhythm: Normal rate and regular rhythm.  Pulmonary:     Effort: Pulmonary effort is normal. No respiratory distress.     Breath sounds: Normal breath sounds. No wheezing.  Abdominal:     General: Bowel sounds are normal. There is no distension.     Tenderness: There is no abdominal tenderness. There is no guarding  or rebound.     Comments:    Musculoskeletal:     Comments: Rt leg wrapped with ACE wrap Left leg - no swelling  Neurological:     Mental Status: She is alert. Mental status is at baseline.     Motor: No weakness.     Labs reviewed: Basic Metabolic Panel: Recent Labs    09/08/23 0615 09/09/23 0559 09/10/23 0530 09/27/23 0000 10/07/23 0000 01/20/24 1346 02/23/24 0000 08/01/24 1043 08/01/24 1847  NA 140 141 138   < > 142 137 140 139  --   K 3.3* 4.3 3.8   < > 4.2 3.7 4.1 4.0  --   CL 106 108 102   < > 105 99 105 102  --   CO2 24 25 25    < > 28* 26 23*  --   --   GLUCOSE 199* 143* 181*  --   --  283*  --  209*  --   BUN 25* 23 22   < > 17 22 21  27*  --   CREATININE 0.93 0.94 0.92   < > 0.9 0.85 0.9 1.20* 1.46*  CALCIUM  8.5* 8.7* 8.3*   < > 8.5* 9.0 9.0  --   --   MG 2.0 2.1 2.2  --   --   --   --   --   --    < > =  values in this interval not displayed.   Liver Function Tests: Recent Labs    09/27/23 0000 09/28/23 0000 10/07/23 0000  AST  --  15 20  ALT  --  16 16  ALKPHOS  --  147* 103  ALBUMIN 3.3*  --  3.3*   No results for input(s): LIPASE, AMYLASE in the last 8760 hours. No results for input(s): AMMONIA in the last 8760 hours. CBC: Recent Labs    09/10/23 0530 09/10/23 0530 09/28/23 0000 10/07/23 0000 01/20/24 1346 08/01/24 1043 08/01/24 1847  WBC 8.1   < > 4.9 4.6 6.8  --  7.3  NEUTROABS  --   --  3,244.00 2,838.00 5.6  --   --   HGB 10.5*  --  11.0* 11.6* 14.0 11.2* 10.8*  HCT 31.9*  --  34* 35* 43.7 33.0* 32.2*  MCV 95.8  --   --   --  97.5  --  95.5  PLT 119*  --  138* 145* 157  --  197   < > = values in this interval not displayed.   Cardiac Enzymes: No results for input(s): CKTOTAL, CKMB, CKMBINDEX, TROPONINI in the last 8760 hours. BNP: Invalid input(s): POCBNP Lab Results  Component Value Date   HGBA1C 8.0 (H) 08/02/2024   Lab Results  Component Value Date   TSH 1.93 02/25/2023   Lab Results  Component Value Date    VITAMINB12 414 08/22/2023   Lab Results  Component Value Date   FOLATE 19.7 03/02/2022   No results found for: IRON, TIBC, FERRITIN  Imaging and Procedures obtained prior to SNF admission: No results found.  Assessment and Plan Assessment & Plan  [facility]  Pt does not want to      Family/ staff Communication:   Labs/tests ordered:  Jackalyn Blazing

## 2024-08-09 ENCOUNTER — Telehealth: Payer: Self-pay

## 2024-08-09 NOTE — Telephone Encounter (Signed)
-  Nurse at [facility] called stating that there are some issues with the pt's orders. First, she said that on his discharge summary from her surgery that it says she needs to follow up with Dr. Harden in 1 week but she does not have a follow up until two weeks out. She also has been prescribed doxycycline but it has no stop date. While on the phone with the nurse she informed me that the pt's son called and said that the doxycycline is cauing the pt severe diarrhea. Please advise and call the nursing home back at the phone number below  CB: [phone]

## 2024-08-10 ENCOUNTER — Telehealth: Payer: Self-pay | Admitting: Orthopedic Surgery

## 2024-08-10 ENCOUNTER — Encounter: Payer: Self-pay | Admitting: Sports Medicine

## 2024-08-10 NOTE — Telephone Encounter (Signed)
 [name] from [facility] called and said the patient has an antibiotic to take but she is saying its making her have diarrhea so she is refusing to take it. She wants to know what else can she take? CB#[phone] Ext. [phone]

## 2024-08-10 NOTE — Telephone Encounter (Signed)
 This goes along with the other message from this morning. Please advise.

## 2024-08-10 NOTE — Telephone Encounter (Signed)
 Can you please see message below. The pt had a HDW removal right ankle 08/01/2024. I can move the appt up but the son says she is having diarrhea with doxy and nurse states there was not a stop date for med.

## 2024-08-12 MED ORDER — SULFAMETHOXAZOLE-TRIMETHOPRIM 800-160 MG PO TABS: 1.0000 | ORAL_TABLET | Freq: Two times a day (BID) | ORAL | 0 refills | Status: AC

## 2024-08-12 NOTE — Telephone Encounter (Signed)
 [facility] staff changed to bactrim, stop doxy

## 2024-08-12 NOTE — Addendum Note (Signed)
 Addended by: Jerryl Holzhauer R on: 08/12/2024 06:52 AM   Modules accepted: Orders

## 2024-08-12 NOTE — Telephone Encounter (Signed)
 Patient having diarrhea. Patient feels may be related to the Doxycycline.   Cultures sensitive to bactrim as well.  Spoke with [facility].   Order to stop the Doxycycline and start the Bactrim. Bactrim sent to [facility] pharmacy.  Patient to see Harden on 08/14/24

## 2024-08-13 ENCOUNTER — Ambulatory Visit: Attending: Cardiology

## 2024-08-13 DIAGNOSIS — Z950 Presence of cardiac pacemaker: Secondary | ICD-10-CM | POA: Diagnosis not present

## 2024-08-13 DIAGNOSIS — I5022 Chronic systolic (congestive) heart failure: Secondary | ICD-10-CM | POA: Diagnosis not present

## 2024-08-14 ENCOUNTER — Ambulatory Visit (INDEPENDENT_AMBULATORY_CARE_PROVIDER_SITE_OTHER): Admitting: Orthopedic Surgery

## 2024-08-14 DIAGNOSIS — Z8781 Personal history of (healed) traumatic fracture: Secondary | ICD-10-CM

## 2024-08-14 DIAGNOSIS — L03119 Cellulitis of unspecified part of limb: Secondary | ICD-10-CM

## 2024-08-14 DIAGNOSIS — Z9889 Other specified postprocedural states: Secondary | ICD-10-CM

## 2024-08-15 ENCOUNTER — Encounter: Payer: Self-pay | Admitting: Orthopedic Surgery

## 2024-08-15 NOTE — Progress Notes (Signed)
 Office Visit Note   Patient: Grace Robinson           Date of Birth: 1926-04-04           MRN: [id number] Visit Date: 08/14/2024              Requested by: Mast, Man X, NP [address] [place],  [place] [address] PCP: Mast, Man X, NP  Chief Complaint  Patient presents with   Right Ankle - Routine Post Op    08/01/2024 right ankle removal HDW      HPI: Discussed the use of AI scribe software for clinical note transcription with the patient, who gave verbal consent to proceed.  History of Present Illness Grace Robinson is a 89 year old female who presents for follow-up after right ankle hardware removal.  She is two weeks post right ankle hardware removal and is managing her recovery. She is able to bear weight on the foot and has been keeping it elevated. She is concerned about changing the bandage daily, noting that 'they won't do that where I am.'  She was initially prescribed an antibiotic post-hospitalization, which resulted in significant diarrhea. Consequently, she was switched to a different antibiotic two nights ago. She inquires about discontinuing antibiotics altogether. She mentions being told to eat yogurt but is informed that it may not be sufficient alone.     Assessment & Plan: Visit Diagnoses: No diagnosis found.  Plan: Assessment and Plan Assessment & Plan Postoperative care following right ankle hardware removal with mild ischemic changes of incision Two weeks post right ankle hardware removal with mild ischemic changes along the incision, well approximated, no dehiscence, some maceration from serosanguineous drainage, no cellulitis. - Elevate foot to heart level when sitting or lying down. - Allow weight bearing as comfortable. - Wash incision with soap and water, pat dry. - Apply gauze and ace wrap daily. - Change bandage daily. - Re-use ace wrap, obtain new non-sterile 4x4 gauze. - Continue wearing post-operative shoe for ease over bandage.       Follow-Up Instructions: No follow-ups on file.   Ortho Exam  Patient is alert, oriented, no adenopathy, well-dressed, normal affect, normal respiratory effort. Physical Exam CARDIOVASCULAR: Palpable dorsalis pedis pulse. EXTREMITIES: Mild ischemic changes along incision. Maceration from clear serosanguineous drainage. No cellulitis. Incision well approximated, no dehiscence.      Imaging: No results found. No images are attached to the encounter.  Labs: Lab Results  Component Value Date   HGBA1C 8.0 (H) 08/02/2024   HGBA1C 7.5 02/25/2023   HGBA1C 7.5 (H) 12/31/2022   ESRSEDRATE 48 (H) 09/06/2016   CRP 0.1 (L) 09/06/2016   REPTSTATUS 08/06/2024 FINAL 08/01/2024   GRAMSTAIN  08/01/2024    RARE WBC PRESENT, PREDOMINANTLY PMN RARE GRAM POSITIVE COCCI IN PAIRS    CULT  08/01/2024    FEW STAPHYLOCOCCUS AUREUS NO ANAEROBES ISOLATED Performed at [facility], [address], [place], [place] [address]    [facility] STAPHYLOCOCCUS AUREUS 08/01/2024     Lab Results  Component Value Date   ALBUMIN 3.3 (A) 10/07/2023   ALBUMIN 3.3 (A) 09/27/2023   ALBUMIN 3.8 02/25/2023    Lab Results  Component Value Date   MG 2.2 09/10/2023   MG 2.1 09/09/2023   MG 2.0 09/08/2023   Lab Results  Component Value Date   VD25OH 41 02/25/2023    No results found for: PREALBUMIN    Latest Ref Rng & Units 08/01/2024  6:47 PM 08/01/2024   10:43 AM 01/20/2024    1:46 PM  CBC EXTENDED  WBC 4.0 - 10.5 K/uL 7.3   6.8   RBC 3.87 - 5.11 MIL/uL 3.37   4.48   Hemoglobin 12.0 - 15.0 g/dL 89.1  88.7  85.9   HCT 36.0 - 46.0 % 32.2  33.0  43.7   Platelets 150 - 400 K/uL 197   157   NEUT# 1.7 - 7.7 K/uL   5.6   Lymph# 0.7 - 4.0 K/uL   0.6      There is no height or weight on file to calculate BMI.  Orders:  No orders of the defined types were placed in this encounter.  No orders of the defined types were placed in this encounter.    Procedures: No procedures  performed  Clinical Data: No additional findings.  ROS:  All other systems negative, except as noted in the HPI. Review of Systems  Objective: Vital Signs: There were no vitals taken for this visit.  Specialty Comments:  No specialty comments available.  PMFS History: Patient Active Problem List   Diagnosis Date Noted   Bone infection, ankle/foot (HCC) 08/01/2024   Hardware complicating wound infection 08/01/2024   Lower limb ulcer, ankle, right, with necrosis of bone (HCC) 08/01/2024   Infection 08/01/2024   Numbness of right hand 11/03/2023   Right calf pain 10/10/2023   Cellulitis 09/26/2023   IBS (irritable bowel syndrome) 09/10/2023   Open wound of right ankle 09/09/2023   Hypokalemia 09/08/2023   Dislocation of ankle, right, open, initial encounter 09/07/2023   Open fracture of right distal fibula 09/06/2023   COVID-19 virus infection 06/09/2023   Gait abnormality 05/19/2023   Osteoarthritis 05/03/2023   Radiation dermatitis 05/03/2023   Osteopenia after menopause 03/04/2023   Closed pelvic fracture (HCC) 02/23/2023   Lower back pain 02/23/2023   Hydronephrosis 02/23/2023   Sigmoid diverticulitis 02/23/2023   Aortic atherosclerosis 02/23/2023   Hyperlipidemia associated with type 2 diabetes mellitus (HCC) 02/23/2023   Vitamin B12 deficiency 02/23/2023   Vitamin D  deficiency 02/23/2023   Type 2 diabetes mellitus with renal complication (HCC) 02/23/2023   S/P lumpectomy, right breast 01/05/2023   Ductal carcinoma in situ (DCIS) of right breast 12/06/2022   NICM (nonischemic cardiomyopathy) (HCC) 07/30/2021   Degenerative tear of posterior horn of medial meniscus of left knee 04/14/2016   Tear of lateral meniscus of left knee 04/14/2016   Synovial plica of left knee 04/14/2016   Mechanical complication of implantable cardioverter-defibrillator (ICD) 01/17/2014   Fall 07/10/2013   Paroxysmal ventricular tachycardia (HCC) 12/13/2012   Biventricular implantable  cardioverter-defibrillator -Medtronic 12/08/2011   Atrial tachycardia 12/08/2011   HTN (hypertension) 05/19/2011   CARDIOMYOPATHY, PRIMARY, DILATED 12/01/2010   SYSTOLIC HEART FAILURE, CHRONIC 12/01/2010   Past Medical History:  Diagnosis Date   Biventricular ICD (implantable cardiac defibrillator) in place    downgraded from CRT-D to CRT-P 08/30/22   Breast cancer (HCC)    Cellulitis    Change in bowel habits    CHF (congestive heart failure) (HCC)    Colon polyps    Diabetes mellitus    Type II   Diarrhea    Dyslipidemia    HOH (hard of hearing)    left   HTN (hypertension)    LBBB (left bundle branch block)    chronic   Meniere's disease    Nonischemic cardiomyopathy (HCC) 2006   Presence of permanent cardiac pacemaker    T wave oversensing  01/17/2014   Ulcerative colitis    Varicose veins    4 lazer  treatment each leg    Family History  Problem Relation Age of Onset   Breast cancer Mother 40       met to lungs   Emphysema Father 70   Heart disease Father    Healthy Child    Lung cancer Cousin        maternal first cousin, she smoked   Colon cancer Neg Hx    Rectal cancer Neg Hx    Stomach cancer Neg Hx    Liver cancer Neg Hx    Esophageal cancer Neg Hx     Past Surgical History:  Procedure Laterality Date   ABDOMINAL HYSTERECTOMY     BIV PACEMAKER GENERATOR CHANGEOUT N/A 08/30/2022   Procedure: BIV PACEMAKER GENERATOR CHANGEOUT;  Surgeon: Fernande Elspeth BROCKS, MD;  Location: [facility] INVASIVE CV LAB;  Service: Cardiovascular;  Laterality: N/A;   BREAST LUMPECTOMY WITH RADIOACTIVE SEED LOCALIZATION Right 01/05/2023   Procedure: RIGHT BREAST BRACKETED LUMPECTOMY WITH RADIOACTIVE SEED LOCALIZATION;  Surgeon: Vernetta Berg, MD;  Location: [facility] OR;  Service: General;  Laterality: Right;   CARDIAC CATHETERIZATION  08/2011   CAST APPLICATION Right 09/06/2023   Procedure: CAST APPLICATION;  Surgeon: Jerri Kay HERO, MD;  Location: [facility] OR;  Service: Orthopedics;  Laterality:  Right;   CHOLECYSTECTOMY     COLONOSCOPY     EP Study  08/22/08   HARDWARE REMOVAL Right 08/01/2024   Procedure: REMOVAL, HARDWARE;  Surgeon: Harden Jerona GAILS, MD;  Location: [facility] OR;  Service: Orthopedics;  Laterality: Right;  RIGHT ANKLE REMOVAL OF RETAINED HARDWARE   I & D EXTREMITY Right 09/06/2023   Procedure: IRRIGATION AND DEBRIDEMENT RIGHT ANKLE;  Surgeon: Jerri Kay HERO, MD;  Location: [facility] OR;  Service: Orthopedics;  Laterality: Right;   IMPLANTABLE CARDIOVERTER DEFIBRILLATOR GENERATOR CHANGE N/A 08/22/2013   Procedure: IMPLANTABLE CARDIOVERTER DEFIBRILLATOR GENERATOR CHANGE;  Surgeon: Elspeth BROCKS Fernande, MD;  Location: [facility] CATH LAB;  Service: Cardiovascular;  Laterality: N/A;   INCISION AND DRAINAGE OF DEEP ABSCESS, CALF Right 08/01/2024   Procedure: INCISION AND DRAINAGE OF DEEP ABSCESS, CALF;  Surgeon: Harden Jerona GAILS, MD;  Location: [facility] OR;  Service: Orthopedics;  Laterality: Right;  IRRIGATION AND DEBRIDEMENT RIGHT ANKLE   KNEE ARTHROSCOPY Left 04/14/2016   Procedure: LEFT KNEE ARTHROSCOPY, PARTIAL MEDIAL AND PARTIAL LATERAL MENISCECTOMY, MEDIAL PLICA;  Surgeon: Norleen Gavel, MD;  Location: [facility] OR;  Service: Orthopedics;  Laterality: Left;   mastoid sugery     in the setting of Meniere's disease   ORIF ANKLE FRACTURE Right 09/09/2023   Procedure: OPEN REDUCTION INTERNAL FIXATION (ORIF) ANKLE FRACTURE;  Surgeon: Harden Jerona GAILS, MD;  Location: [facility] OR;  Service: Orthopedics;  Laterality: Right;   TONSILLECTOMY AND ADENOIDECTOMY     Social History   Occupational History   Occupation: retired    Comment: Runner, 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

## 2024-08-15 NOTE — Progress Notes (Signed)
 EPIC Encounter for ICM Monitoring  Patient Name: Grace Robinson is a 89 y.o. female Date: 08/15/2024 Primary Care Physican: Mast, Man X, NP Primary Cardiologist: Camnitz Electrophysiologist: Camnitz Bi-V Pacing:  97.4%     12/24/2022 Weight: 130 lbs 02/16/2024 Office Weight: 126 lbs 03/22/2024 Weight: 127 lbs per facility note 08/06/2024 Weight: 133 lbs per facility note   Since 17-Jun-2024 VT (>4 beats)  2 Time in AT/AF  <0.1 hr/day (<0.1%)          Transmission results reviewed.    Currently resides in ALF [facility] Room 823, Nurses station on [address].     Optivol Thoracic impedance suggesting normal fluid levels with the exception of possible fluid accumulation from 07/30/2024-08/13/2024.   Prescribed:  Furosemide  20 mg take 1 tablet (20 mg total) by mouth every other day alternating with 2 tablets (40 mg total) every other day.  Per [facility] Nurse 08/06/2024 facility physician note, Pt is taking 40 mg bid. Potassium 10 mEq 1 tablet twice a day.   Labs: 02/23/2024 Creatinine 0.9,   BUN 21, Potassium 4.1, Sodium 140 01/20/2024 Creatinine 0.85, BUN 22, Potassium 3.7, Sodium 137, GFR >60 10/07/2023 Creatinine 0.9,   BUN 17, Potassium 4.2, Sodium 142  09/27/2023 Creatinine 1.0,   BUN 21, Potassium 4.1, Sodium 137  09/10/2023 Creatinine 0.92, BUN 22, Potassium 3.8, Sodium 138  A complete set of results can be found in Results Review.   Recommendations:  No changes.    Follow-up plan: ICM clinic phone appointment on 09/17/2024.   91 day device clinic remote transmission 08/29/2024.      EP/Cardiology Office Visits:  Recall 06/02/2024 with Grace Barrack, NP (6 month).       Copy of ICM check sent to Dr. Inocencio.     Remote monitoring is medically necessary for Heart Failure Management.    Daily Thoracic Impedance ICM trend: 05/14/2024 through 08/13/2024.    12-14 Month Thoracic Impedance ICM trend:     Grace GORMAN Garner, RN 08/15/2024 3:28 PM

## 2024-08-16 ENCOUNTER — Telehealth: Payer: Self-pay | Admitting: Orthopedic Surgery

## 2024-08-16 NOTE — Telephone Encounter (Signed)
 Faxed last OV note to [facility]

## 2024-08-16 NOTE — Telephone Encounter (Signed)
 Grace Robinson from [facility] called requesting notes from last appt notes. Please fax to [phone]. Grace Robinson secure number is [phone] ext [phone].

## 2024-08-23 DIAGNOSIS — L570 Actinic keratosis: Secondary | ICD-10-CM | POA: Diagnosis not present

## 2024-08-28 ENCOUNTER — Ambulatory Visit: Admitting: Orthopedic Surgery

## 2024-08-28 DIAGNOSIS — T847XXA Infection and inflammatory reaction due to other internal orthopedic prosthetic devices, implants and grafts, initial encounter: Secondary | ICD-10-CM

## 2024-08-28 DIAGNOSIS — T8130XA Disruption of wound, unspecified, initial encounter: Secondary | ICD-10-CM

## 2024-08-28 DIAGNOSIS — M79672 Pain in left foot: Secondary | ICD-10-CM | POA: Diagnosis not present

## 2024-08-28 DIAGNOSIS — M79671 Pain in right foot: Secondary | ICD-10-CM | POA: Diagnosis not present

## 2024-08-28 DIAGNOSIS — B351 Tinea unguium: Secondary | ICD-10-CM | POA: Diagnosis not present

## 2024-08-28 DIAGNOSIS — L84 Corns and callosities: Secondary | ICD-10-CM | POA: Diagnosis not present

## 2024-08-28 DIAGNOSIS — E1159 Type 2 diabetes mellitus with other circulatory complications: Secondary | ICD-10-CM | POA: Diagnosis not present

## 2024-08-29 ENCOUNTER — Encounter: Payer: Self-pay | Admitting: Orthopedic Surgery

## 2024-08-29 ENCOUNTER — Ambulatory Visit (INDEPENDENT_AMBULATORY_CARE_PROVIDER_SITE_OTHER): Payer: Medicare Other

## 2024-08-29 DIAGNOSIS — I5022 Chronic systolic (congestive) heart failure: Secondary | ICD-10-CM | POA: Diagnosis not present

## 2024-08-29 NOTE — Progress Notes (Signed)
 Office Visit Note   Patient: Grace Robinson           Date of Birth: 1925/11/08           MRN: [id number] Visit Date: 08/28/2024              Requested by: Mast, Man X, NP [address] [place],  [place] [address] PCP: Mast, Man X, NP  Chief Complaint  Patient presents with   Right Ankle - Routine Post Op    08/01/2024 right ankle removal HDW      HPI: Discussed the use of AI scribe software for clinical note transcription with the patient, who gave verbal consent to proceed.  History of Present Illness Grace Robinson is a 89 year old female who presents with swelling and wound care management of her right leg.  She is experiencing swelling in her right leg, particularly in the calf area, following a recent procedure. The swelling is more pronounced in the calf than in the foot. She has been keeping her leg elevated, which she believes has been helping.  She visited a podiatrist this morning to have her toenails cut. No medication has been applied to the wound as the facility did not receive any orders for treatment. She has been using only water for cleaning the area.  She is concerned about the lack of antibiotic application, as she was told there were no orders for it. She has been instructed to wash her right ankle with soap and water daily, apply a damp gauze with Vosh, and use an Ace wrap, changing it daily. She is to elevate her right leg and bear weight as tolerated.     Assessment & Plan: Visit Diagnoses: No diagnosis found.  Plan: Assessment and Plan Assessment & Plan Right lower extremity postoperative wound with mild wound breakdown and ischemic changes Wound 75% healed, no cellulitis, odor, drainage, or infection, indicating favorable recovery. - Wash right ankle with soap and water daily. - Apply 4x4 gauze damp with Vosh plus Ace wrap. - Change dressing daily. - Harvest sutures today. - Elevate right leg. - Weight bearing as tolerated on the right.  Right  lower extremity edema Increased swelling in the right lower extremity, particularly in the calf area, due to anatomical structure. - Elevate right leg. - Encourage ankle movement and toe wiggling to reduce swelling.      Follow-Up Instructions: No follow-ups on file.   Ortho Exam  Patient is alert, oriented, no adenopathy, well-dressed, normal affect, normal respiratory effort. Physical Exam EXTREMITIES: Ischemic changes along incision with increased swelling. No cellulitis, odor, drainage, or signs of infection.      Imaging: No results found. No images are attached to the encounter.  Labs: Lab Results  Component Value Date   HGBA1C 8.0 (H) 08/02/2024   HGBA1C 7.5 02/25/2023   HGBA1C 7.5 (H) 12/31/2022   ESRSEDRATE 48 (H) 09/06/2016   CRP 0.1 (L) 09/06/2016   REPTSTATUS 08/06/2024 FINAL 08/01/2024   GRAMSTAIN  08/01/2024    RARE WBC PRESENT, PREDOMINANTLY PMN RARE GRAM POSITIVE COCCI IN PAIRS    CULT  08/01/2024    FEW STAPHYLOCOCCUS AUREUS NO ANAEROBES ISOLATED Performed at [facility], [address], [place], [place] [address]    [facility] STAPHYLOCOCCUS AUREUS 08/01/2024     Lab Results  Component Value Date   ALBUMIN 3.3 (A) 10/07/2023   ALBUMIN 3.3 (A) 09/27/2023   ALBUMIN 3.8 02/25/2023    Lab Results  Component Value  Date   MG 2.2 09/10/2023   MG 2.1 09/09/2023   MG 2.0 09/08/2023   Lab Results  Component Value Date   VD25OH 41 02/25/2023    No results found for: PREALBUMIN    Latest Ref Rng & Units 08/01/2024    6:47 PM 08/01/2024   10:43 AM 01/20/2024    1:46 PM  CBC EXTENDED  WBC 4.0 - 10.5 K/uL 7.3   6.8   RBC 3.87 - 5.11 MIL/uL 3.37   4.48   Hemoglobin 12.0 - 15.0 g/dL 89.1  88.7  85.9   HCT 36.0 - 46.0 % 32.2  33.0  43.7   Platelets 150 - 400 K/uL 197   157   NEUT# 1.7 - 7.7 K/uL   5.6   Lymph# 0.7 - 4.0 K/uL   0.6      There is no height or weight on file to calculate BMI.  Orders:  No orders of the defined types  were placed in this encounter.  No orders of the defined types were placed in this encounter.    Procedures: No procedures performed  Clinical Data: No additional findings.  ROS:  All other systems negative, except as noted in the HPI. Review of Systems  Objective: Vital Signs: There were no vitals taken for this visit.  Specialty Comments:  No specialty comments available.  PMFS History: Patient Active Problem List   Diagnosis Date Noted   Bone infection, ankle/foot (HCC) 08/01/2024   Hardware complicating wound infection 08/01/2024   Lower limb ulcer, ankle, right, with necrosis of bone (HCC) 08/01/2024   Infection 08/01/2024   Numbness of right hand 11/03/2023   Right calf pain 10/10/2023   Cellulitis 09/26/2023   IBS (irritable bowel syndrome) 09/10/2023   Open wound of right ankle 09/09/2023   Hypokalemia 09/08/2023   Dislocation of ankle, right, open, initial encounter 09/07/2023   Open fracture of right distal fibula 09/06/2023   COVID-19 virus infection 06/09/2023   Gait abnormality 05/19/2023   Osteoarthritis 05/03/2023   Radiation dermatitis 05/03/2023   Osteopenia after menopause 03/04/2023   Closed pelvic fracture (HCC) 02/23/2023   Lower back pain 02/23/2023   Hydronephrosis 02/23/2023   Sigmoid diverticulitis 02/23/2023   Aortic atherosclerosis 02/23/2023   Hyperlipidemia associated with type 2 diabetes mellitus (HCC) 02/23/2023   Vitamin B12 deficiency 02/23/2023   Vitamin D  deficiency 02/23/2023   Type 2 diabetes mellitus with renal complication (HCC) 02/23/2023   S/P lumpectomy, right breast 01/05/2023   Ductal carcinoma in situ (DCIS) of right breast 12/06/2022   NICM (nonischemic cardiomyopathy) (HCC) 07/30/2021   Degenerative tear of posterior horn of medial meniscus of left knee 04/14/2016   Tear of lateral meniscus of left knee 04/14/2016   Synovial plica of left knee 04/14/2016   Mechanical complication of implantable  cardioverter-defibrillator (ICD) 01/17/2014   Fall 07/10/2013   Paroxysmal ventricular tachycardia (HCC) 12/13/2012   Biventricular implantable cardioverter-defibrillator -Medtronic 12/08/2011   Atrial tachycardia 12/08/2011   HTN (hypertension) 05/19/2011   CARDIOMYOPATHY, PRIMARY, DILATED 12/01/2010   SYSTOLIC HEART FAILURE, CHRONIC 12/01/2010   Past Medical History:  Diagnosis Date   Biventricular ICD (implantable cardiac defibrillator) in place    downgraded from CRT-D to CRT-P 08/30/22   Breast cancer (HCC)    Cellulitis    Change in bowel habits    CHF (congestive heart failure) (HCC)    Colon polyps    Diabetes mellitus    Type II   Diarrhea    Dyslipidemia  HOH (hard of hearing)    left   HTN (hypertension)    LBBB (left bundle branch block)    chronic   Meniere's disease    Nonischemic cardiomyopathy (HCC) 2006   Presence of permanent cardiac pacemaker    T wave oversensing 01/17/2014   Ulcerative colitis    Varicose veins    4 lazer  treatment each leg    Family History  Problem Relation Age of Onset   Breast cancer Mother 20       met to lungs   Emphysema Father 70   Heart disease Father    Healthy Child    Lung cancer Cousin        maternal first cousin, she smoked   Colon cancer Neg Hx    Rectal cancer Neg Hx    Stomach cancer Neg Hx    Liver cancer Neg Hx    Esophageal cancer Neg Hx     Past Surgical History:  Procedure Laterality Date   ABDOMINAL HYSTERECTOMY     BIV PACEMAKER GENERATOR CHANGEOUT N/A 08/30/2022   Procedure: BIV PACEMAKER GENERATOR CHANGEOUT;  Surgeon: Fernande Elspeth BROCKS, MD;  Location: [facility] INVASIVE CV LAB;  Service: Cardiovascular;  Laterality: N/A;   BREAST LUMPECTOMY WITH RADIOACTIVE SEED LOCALIZATION Right 01/05/2023   Procedure: RIGHT BREAST BRACKETED LUMPECTOMY WITH RADIOACTIVE SEED LOCALIZATION;  Surgeon: Vernetta Berg, MD;  Location: [facility] OR;  Service: General;  Laterality: Right;   CARDIAC CATHETERIZATION  08/2011   CAST  APPLICATION Right 09/06/2023   Procedure: CAST APPLICATION;  Surgeon: Jerri Kay HERO, MD;  Location: [facility] OR;  Service: Orthopedics;  Laterality: Right;   CHOLECYSTECTOMY     COLONOSCOPY     EP Study  08/22/08   HARDWARE REMOVAL Right 08/01/2024   Procedure: REMOVAL, HARDWARE;  Surgeon: Harden Jerona GAILS, MD;  Location: [facility] OR;  Service: Orthopedics;  Laterality: Right;  RIGHT ANKLE REMOVAL OF RETAINED HARDWARE   I & D EXTREMITY Right 09/06/2023   Procedure: IRRIGATION AND DEBRIDEMENT RIGHT ANKLE;  Surgeon: Jerri Kay HERO, MD;  Location: [facility] OR;  Service: Orthopedics;  Laterality: Right;   IMPLANTABLE CARDIOVERTER DEFIBRILLATOR GENERATOR CHANGE N/A 08/22/2013   Procedure: IMPLANTABLE CARDIOVERTER DEFIBRILLATOR GENERATOR CHANGE;  Surgeon: Elspeth BROCKS Fernande, MD;  Location: [facility] CATH LAB;  Service: Cardiovascular;  Laterality: N/A;   INCISION AND DRAINAGE OF DEEP ABSCESS, CALF Right 08/01/2024   Procedure: INCISION AND DRAINAGE OF DEEP ABSCESS, CALF;  Surgeon: Harden Jerona GAILS, MD;  Location: [facility] OR;  Service: Orthopedics;  Laterality: Right;  IRRIGATION AND DEBRIDEMENT RIGHT ANKLE   KNEE ARTHROSCOPY Left 04/14/2016   Procedure: LEFT KNEE ARTHROSCOPY, PARTIAL MEDIAL AND PARTIAL LATERAL MENISCECTOMY, MEDIAL PLICA;  Surgeon: Norleen Gavel, MD;  Location: [facility] OR;  Service: Orthopedics;  Laterality: Left;   mastoid sugery     in the setting of Meniere's disease   ORIF ANKLE FRACTURE Right 09/09/2023   Procedure: OPEN REDUCTION INTERNAL FIXATION (ORIF) ANKLE FRACTURE;  Surgeon: Harden Jerona GAILS, MD;  Location: [facility] OR;  Service: Orthopedics;  Laterality: Right;   TONSILLECTOMY AND ADENOIDECTOMY     Social History   Occupational History   Occupation: retired    Comment: runner, 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

## 2024-08-30 ENCOUNTER — Ambulatory Visit: Attending: Cardiology

## 2024-08-30 ENCOUNTER — Telehealth: Payer: Self-pay

## 2024-08-30 DIAGNOSIS — I5022 Chronic systolic (congestive) heart failure: Secondary | ICD-10-CM

## 2024-08-30 DIAGNOSIS — Z950 Presence of cardiac pacemaker: Secondary | ICD-10-CM

## 2024-08-30 LAB — CUP PACEART REMOTE DEVICE CHECK
Battery Remaining Longevity: 117 mo
Battery Voltage: 3.01 V
Brady Statistic AP VP Percent: 20.67 %
Brady Statistic AP VS Percent: 0.29 %
Brady Statistic AS VP Percent: 77.11 %
Brady Statistic AS VS Percent: 1.93 %
Brady Statistic RA Percent Paced: 21.16 %
Brady Statistic RV Percent Paced: 0.4 %
Date Time Interrogation Session: 20251029011038
Implantable Lead Connection Status: 753985
Implantable Lead Connection Status: 753985
Implantable Lead Connection Status: 753985
Implantable Lead Connection Status: 753985
Implantable Lead Implant Date: 20031007
Implantable Lead Implant Date: 20031007
Implantable Lead Implant Date: 20031007
Implantable Lead Implant Date: 20060227
Implantable Lead Location: 753858
Implantable Lead Location: 753859
Implantable Lead Location: 753860
Implantable Lead Location: 753860
Implantable Lead Model: 4193
Implantable Lead Model: 4469
Implantable Lead Model: 4470
Implantable Lead Model: 6949
Implantable Lead Serial Number: 337988
Implantable Lead Serial Number: 361236
Implantable Pulse Generator Implant Date: 20231030
Lead Channel Impedance Value: 304 Ohm
Lead Channel Impedance Value: 323 Ohm
Lead Channel Impedance Value: 3363 Ohm
Lead Channel Impedance Value: 3363 Ohm
Lead Channel Impedance Value: 3363 Ohm
Lead Channel Impedance Value: 361 Ohm
Lead Channel Impedance Value: 475 Ohm
Lead Channel Impedance Value: 475 Ohm
Lead Channel Impedance Value: 532 Ohm
Lead Channel Pacing Threshold Amplitude: 0.625 V
Lead Channel Pacing Threshold Amplitude: 1.375 V
Lead Channel Pacing Threshold Pulse Width: 0.4 ms
Lead Channel Pacing Threshold Pulse Width: 0.4 ms
Lead Channel Sensing Intrinsic Amplitude: 1.125 mV
Lead Channel Sensing Intrinsic Amplitude: 1.125 mV
Lead Channel Sensing Intrinsic Amplitude: 31.625 mV
Lead Channel Sensing Intrinsic Amplitude: 31.625 mV
Lead Channel Setting Pacing Amplitude: 1.5 V
Lead Channel Setting Pacing Amplitude: 1.5 V
Lead Channel Setting Pacing Amplitude: 3.25 V
Lead Channel Setting Pacing Pulse Width: 0.4 ms
Lead Channel Setting Pacing Pulse Width: 0.8 ms
Lead Channel Setting Sensing Sensitivity: 1.2 mV
Zone Setting Status: 755011

## 2024-08-30 NOTE — Telephone Encounter (Signed)
 Spoke with patient and provided remote transmission results (see 08/29/2024 ICM note for details).  She is unsure of Furosemide  dosage and provided nurses desk phone number which is [phone].  She is in Room 804

## 2024-08-30 NOTE — Progress Notes (Signed)
 EPIC Encounter for ICM Monitoring  Patient Name: Grace Robinson is a 89 y.o. female Date: 08/30/2024 Primary Care Physican: Mast, Man X, NP Primary Cardiologist: Camnitz Electrophysiologist: Camnitz Bi-V Pacing:  97.8%     12/24/2022 Weight: 130 lbs 02/16/2024 Office Weight: 126 lbs 03/22/2024 Weight: 127 lbs per facility note 08/06/2024 Weight: 133 lbs per facility note   Since 13-Aug-2024 VT (>4 beats)  2 Time in AT/AF  0.0 hr/day (0.0%)          Spoke with patient and heart failure questions reviewed.  Transmission results reviewed.  Pt stated she is unaware of having any fluid symptoms at this time.  She has been dealing with infected ankle and recently had hardware removed.  Otherwise she feels like she is doing okay.  She is unsure of any changes in meds.   Her meals are provided by [facility] and does not provide low salt meals.       Currently resides in ALF [facility] (nurses desk number [phone]).    Since 08/13/2024 ICM Remote Transmission: Optivol Thoracic impedance suggesting possible fluid accumulation starting 08/14/2024.   Prescribed:  Furosemide  40 mg take 1 tablet (40 mg total) by mouth twice a day.  Per [facility] Nurse 08/06/2024 facility physician note, Pt is taking 40 mg bid. Potassium 10 mEq Take 2 tablet(s) (20 mEq total) by mouth twice a day.   Labs: 08/01/2024 Creatinine 1.46 (6:47 PM) 08/01/2024 Creatinine 1.20, BUN 27 Potassium 4.0. Sodium 139 (10:43 AM) 02/23/2024 Creatinine 0.9,   BUN 21, Potassium 4.1, Sodium 140 A complete set of results can be found in Results Review.   Recommendations:  Advised to limit salt if possible and inform ALF staff if she develops any fluid symptoms.  Will attempt to contact ALF nurse to inquire about Lasix  dosage.     Follow-up plan: ICM clinic phone appointment on 09/04/2024 to recheck fluid levels.   91 day device clinic remote transmission pending.      EP/Cardiology Office Visits:  Recall 06/02/2024 with [name], NP (6 month).       Copy of ICM check sent to Dr. Inocencio.     Remote monitoring is medically necessary for Heart Failure Management.    Daily Thoracic Impedance ICM trend: 05/30/2024 through 08/29/2024.    12-14 Month Thoracic Impedance ICM trend:     [name], RN 08/30/2024 9:12 AM

## 2024-08-30 NOTE — Telephone Encounter (Signed)
 Spoke with March, patients nurse at [facility].  See ICM note for details.

## 2024-08-30 NOTE — Progress Notes (Signed)
 Received call back from patient's nurse [name].  She advised Lasix  dosage of 40 mg twice a day was decreased to 40 mg once a day on 08/08/2024 which correlates with impedance trending lower on 08/13/2024.  The ALF physician will be seeing patient tomorrow, 08/31/2024 and Pamila will inform him of the report showing possible fluid accumulation. [name] confirmed patient has not had any fluid symptoms.  Advised will recheck fluid levels next week and will call if still suggesting fluid accumulation.  She appreciated the call regarding the information.

## 2024-08-30 NOTE — Progress Notes (Signed)
 ICM call to [facility] nursing station at [phone].  Spoke with Medford, PRN nurse but she is unsure of patients current Lasix  dosage. She will have [name] the other nurse call back after she gets out of a meeting.

## 2024-08-30 NOTE — Telephone Encounter (Signed)
 ICM Call to [facility]. Spoke with Medford, patients PRN nurse.  Advised of remote transmission results and asked the Furosemide  dosage patient is taking.  She stated she would have patients regular nurse [name] call back due to she is currently in a meeting.  Provided name and number for call back.

## 2024-08-31 ENCOUNTER — Ambulatory Visit: Payer: Self-pay | Admitting: Cardiology

## 2024-09-03 ENCOUNTER — Encounter: Payer: Self-pay | Admitting: Radiology

## 2024-09-03 ENCOUNTER — Telehealth: Payer: Self-pay

## 2024-09-03 NOTE — Telephone Encounter (Signed)
 Received call from patient. She asked if I spoke with ALF nurse on Thursday last week regarding her Lasix  dosage.  Confirmed I spoke with [name] the nurse at the facility and was advised Lasix  was decreased from 40 mg twice a day to 40 mg once a day on 08/08/2024 which correlates with the start of fluid accumulation. Advised [name] planned on informing the facility physician on 08/31/2024 of fluid accumulation. Scheduled repeat remote transmission on 09/04/2024 and will call her and [name] with the results.    Pt is feeling fine but feet do remain swollen at this time.

## 2024-09-04 ENCOUNTER — Ambulatory Visit: Attending: Cardiology

## 2024-09-04 DIAGNOSIS — E1129 Type 2 diabetes mellitus with other diabetic kidney complication: Secondary | ICD-10-CM | POA: Diagnosis not present

## 2024-09-04 DIAGNOSIS — I5022 Chronic systolic (congestive) heart failure: Secondary | ICD-10-CM

## 2024-09-04 DIAGNOSIS — R634 Abnormal weight loss: Secondary | ICD-10-CM | POA: Diagnosis not present

## 2024-09-04 DIAGNOSIS — Z950 Presence of cardiac pacemaker: Secondary | ICD-10-CM

## 2024-09-04 NOTE — Progress Notes (Signed)
 EPIC Encounter for ICM Monitoring  Patient Name: Grace Robinson is a 89 y.o. female Date: 09/04/2024 Primary Care Physican: Mast, Man X, NP Primary Cardiologist: Camnitz Electrophysiologist: Camnitz Bi-V Pacing:  97.8%     12/24/2022 Weight: 130 lbs 02/16/2024 Office Weight: 126 lbs 03/22/2024 Weight: 127 lbs per facility note 08/06/2024 Weight: 133 lbs per facility note   Since 29-Aug-2024 Time in AT/AF  0.0 hr/day (0.0%)          Spoke with patient and heart failure questions reviewed.  Transmission results reviewed.  Pt reports both feet are swollen but the right one is swollen more due to recent surgery to ankle bone was infected after internal fixation with hardware.   Spoke with [name].  Reviewed remote transmission results.  She stated NP is aware of the report showing fluid accumulation and labs ordered today before making any adjustments to Lasix .  Currently resides in ALF [facility] (nurses desk number [phone]).    Since 08/30/2024 ICM Remote Transmission: Optivol Thoracic impedance suggesting possible ongoing fluid accumulation starting 07/30/2024 and worsening after Lasix  decreased to once a day on 08/08/2024.   Prescribed:  Furosemide  40 mg take 1 tablet (40 mg total) by mouth twice a day.  08/30/2024 Per [facility] Nurse Grace Robinson, Lasix  decreased to 40 mg daily on 08/08/2024. Potassium 10 mEq Take 2 tablet(s) (20 mEq total) by mouth twice a day.   Labs: 09/04/2024 BMET pending at [facility] 08/01/2024 Creatinine 1.46 (6:47 PM) 08/01/2024 Creatinine 1.20, BUN 27 Potassium 4.0. Sodium 139 (10:43 AM) 02/23/2024 Creatinine 0.9,   BUN 21, Potassium 4.1, Sodium 140 A complete set of results can be found in Results Review.   Recommendations:  Advised Grace Robinson will recheck fluid levels next week and call with results.  Advised patient will call next week with remote transmission update.            Follow-up plan: ICM clinic phone  appointment on 09/10/2024 to recheck fluid levels.   91 day device clinic remote transmission pending.      EP/Cardiology Office Visits:  Recall 06/02/2024 with Grace Barrack, NP (6 month).       Copy of ICM check sent to Dr. Inocencio.     Remote monitoring is medically necessary for Heart Failure Management.    Daily Thoracic Impedance ICM trend: 06/05/2024 through 09/04/2024.    12-14 Month Thoracic Impedance ICM trend:     Grace GORMAN Garner, RN 09/04/2024 7:19 AM

## 2024-09-05 NOTE — Progress Notes (Signed)
 Remote PPM Transmission

## 2024-09-06 ENCOUNTER — Telehealth: Payer: Self-pay | Admitting: Pulmonary Disease

## 2024-09-06 NOTE — Telephone Encounter (Signed)
 Attempted to call Grace Robinson at [facility] back. No answer, left detailed message giving fax # and advising we will call back as soon as results of BNP are received. Checked chart, no current BNP results present.

## 2024-09-06 NOTE — Telephone Encounter (Signed)
 Grace Robinson with [facility] calling to state they have done labs BNP related to her fluid around the heart. She will be faxing the lab results, and would like to know if her meds should be adjusted. Please advise.   [phone] ext. [phone]

## 2024-09-10 ENCOUNTER — Ambulatory Visit: Attending: Cardiology

## 2024-09-10 DIAGNOSIS — I5022 Chronic systolic (congestive) heart failure: Secondary | ICD-10-CM

## 2024-09-10 DIAGNOSIS — Z950 Presence of cardiac pacemaker: Secondary | ICD-10-CM

## 2024-09-11 ENCOUNTER — Encounter: Payer: Self-pay | Admitting: Orthopedic Surgery

## 2024-09-11 ENCOUNTER — Ambulatory Visit: Admitting: Orthopedic Surgery

## 2024-09-11 ENCOUNTER — Telehealth: Payer: Self-pay

## 2024-09-11 DIAGNOSIS — T8130XA Disruption of wound, unspecified, initial encounter: Secondary | ICD-10-CM

## 2024-09-11 DIAGNOSIS — T847XXA Infection and inflammatory reaction due to other internal orthopedic prosthetic devices, implants and grafts, initial encounter: Secondary | ICD-10-CM

## 2024-09-11 NOTE — Telephone Encounter (Addendum)
 Spoke with Tillman at [facility] and advised per Dr Inocencio pt's Lasix  should be increased to 40mg  bid x 4 days then return to normal dosing.  Gina verbalizes understanding and states she will fax the order over for Dr Inocencio to sign.  She thanked CHARITY FUNDRAISER for the call.

## 2024-09-11 NOTE — Progress Notes (Signed)
  Received: Today Inocencio Soyla Lunger, MD  Octavion Mollenkopf, Mitzie RAMAN, RN Her ejection fraction is recovered on her most recent echo.  I think if she is feeling well, based on her age of 40, would monitor this.  If she is having symptoms, would ensure that she is taking her Lasix  twice a day and if she is, would increase to 80 mg in the morning, 40 mg in the evening for 3 days.       Previous Messages

## 2024-09-11 NOTE — Progress Notes (Signed)
 Office Visit Note   Patient: Grace Robinson           Date of Birth: [birth year]           MRN: [id number] Visit Date: 09/11/2024              Requested by: Mast, Man X, NP [address] [place],  [place] [address] PCP: Mast, Man X, NP  Chief Complaint  Patient presents with   Right Ankle - Routine Post Op    08/01/2024 right ankle removal HDW      HPI: Discussed the use of AI scribe software for clinical note transcription with the patient, who gave verbal consent to proceed.  History of Present Illness Grace Robinson is a 89 year old female who presents with concerns about her right leg wound healing.  She describes her right leg as feeling as though it is 'dying', attributing this sensation to the bandage being wrapped too tightly. Despite applying cream and keeping the foot elevated for six weeks, she feels that it does not look any better to her.  This morning, upon removing the bandage, she noticed bleeding down her heel, which is a new occurrence. Initially, for the first three weeks after her first visit, the wound was not washed or bandaged properly, which she believes has contributed to its current state.  She expresses frustration with the healing process and the initial lack of care for the wound.     Assessment & Plan: Visit Diagnoses:  1. Wound dehiscence   2. Hardware complicating wound infection, initial encounter     Plan: Assessment and Plan Assessment & Plan Right ankle chronic wound with granulation tissue and fibrinous debris Chronic wound with 75% granulation tissue and 25% fibrinous debris. Improvement noted with current treatment. Persistent swelling requires elevation. - Continue washing with soap and water. - Apply 4x4 gauze damp with Vosh. - Apply Ace wrap, change daily. - Continue foot elevation.      Follow-Up Instructions: Return in about 2 weeks (around 09/25/2024).   Ortho Exam  Patient is alert, oriented, no adenopathy,  well-dressed, normal affect, normal respiratory effort. Physical Exam EXTREMITIES: Right leg wound measuring 11 cm in length and 0.5 cm in width, with 75% granulation tissue and 25% fibrinous tissue.      Imaging: No results found. No images are attached to the encounter.  Labs: Lab Results  Component Value Date   HGBA1C 8.0 (H) 08/02/2024   HGBA1C 7.5 02/25/2023   HGBA1C 7.5 (H) 12/31/2022   ESRSEDRATE 48 (H) 09/06/2016   CRP 0.1 (L) 09/06/2016   REPTSTATUS 08/06/2024 FINAL 08/01/2024   GRAMSTAIN  08/01/2024    RARE WBC PRESENT, PREDOMINANTLY PMN RARE GRAM POSITIVE COCCI IN PAIRS    CULT  08/01/2024    FEW STAPHYLOCOCCUS AUREUS NO ANAEROBES ISOLATED Performed at [facility], [address] [place], [place] [address]    [facility] STAPHYLOCOCCUS AUREUS 08/01/2024     Lab Results  Component Value Date   ALBUMIN 3.3 (A) 10/07/2023   ALBUMIN 3.3 (A) 09/27/2023   ALBUMIN 3.8 02/25/2023    Lab Results  Component Value Date   MG 2.2 09/10/2023   MG 2.1 09/09/2023   MG 2.0 09/08/2023   Lab Results  Component Value Date   VD25OH 41 02/25/2023    No results found for: PREALBUMIN    Latest Ref Rng & Units 08/01/2024    6:47 PM 08/01/2024   10:43 AM 01/20/2024  1:46 PM  CBC EXTENDED  WBC 4.0 - 10.5 K/uL 7.3   6.8   RBC 3.87 - 5.11 MIL/uL 3.37   4.48   Hemoglobin 12.0 - 15.0 g/dL 89.1  88.7  85.9   HCT 36.0 - 46.0 % 32.2  33.0  43.7   Platelets 150 - 400 K/uL 197   157   NEUT# 1.7 - 7.7 K/uL   5.6   Lymph# 0.7 - 4.0 K/uL   0.6      There is no height or weight on file to calculate BMI.  Orders:  No orders of the defined types were placed in this encounter.  No orders of the defined types were placed in this encounter.    Procedures: No procedures performed  Clinical Data: No additional findings.  ROS:  All other systems negative, except as noted in the HPI. Review of Systems  Objective: Vital Signs: There were no vitals taken for this  visit.  Specialty Comments:  No specialty comments available.  PMFS History: Patient Active Problem List   Diagnosis Date Noted   Bone infection, ankle/foot (HCC) 08/01/2024   Hardware complicating wound infection 08/01/2024   Lower limb ulcer, ankle, right, with necrosis of bone (HCC) 08/01/2024   Infection 08/01/2024   Numbness of right hand 11/03/2023   Right calf pain 10/10/2023   Cellulitis 09/26/2023   IBS (irritable bowel syndrome) 09/10/2023   Open wound of right ankle 09/09/2023   Hypokalemia 09/08/2023   Dislocation of ankle, right, open, initial encounter 09/07/2023   Open fracture of right distal fibula 09/06/2023   COVID-19 virus infection 06/09/2023   Gait abnormality 05/19/2023   Osteoarthritis 05/03/2023   Radiation dermatitis 05/03/2023   Osteopenia after menopause 03/04/2023   Closed pelvic fracture (HCC) 02/23/2023   Lower back pain 02/23/2023   Hydronephrosis 02/23/2023   Sigmoid diverticulitis 02/23/2023   Aortic atherosclerosis 02/23/2023   Hyperlipidemia associated with type 2 diabetes mellitus (HCC) 02/23/2023   Vitamin B12 deficiency 02/23/2023   Vitamin D  deficiency 02/23/2023   Type 2 diabetes mellitus with renal complication (HCC) 02/23/2023   S/P lumpectomy, right breast 01/05/2023   Ductal carcinoma in situ (DCIS) of right breast 12/06/2022   NICM (nonischemic cardiomyopathy) (HCC) 07/30/2021   Degenerative tear of posterior horn of medial meniscus of left knee 04/14/2016   Tear of lateral meniscus of left knee 04/14/2016   Synovial plica of left knee 04/14/2016   Mechanical complication of implantable cardioverter-defibrillator (ICD) 01/17/2014   Fall 07/10/2013   Paroxysmal ventricular tachycardia (HCC) 12/13/2012   Biventricular implantable cardioverter-defibrillator -Medtronic 12/08/2011   Atrial tachycardia 12/08/2011   HTN (hypertension) 05/19/2011   CARDIOMYOPATHY, PRIMARY, DILATED 12/01/2010   SYSTOLIC HEART FAILURE, CHRONIC  12/01/2010   Past Medical History:  Diagnosis Date   Biventricular ICD (implantable cardiac defibrillator) in place    downgraded from CRT-D to CRT-P 08/30/22   Breast cancer (HCC)    Cellulitis    Change in bowel habits    CHF (congestive heart failure) (HCC)    Colon polyps    Diabetes mellitus    Type II   Diarrhea    Dyslipidemia    HOH (hard of hearing)    left   HTN (hypertension)    LBBB (left bundle branch block)    chronic   Meniere's disease    Nonischemic cardiomyopathy (HCC) 2006   Presence of permanent cardiac pacemaker    T wave oversensing 01/17/2014   Ulcerative colitis    Varicose veins  4 lazer  treatment each leg    Family History  Problem Relation Age of Onset   Breast cancer Mother 51       met to lungs   Emphysema Father 41   Heart disease Father    Healthy Child    Lung cancer Cousin        maternal first cousin, she smoked   Colon cancer Neg Hx    Rectal cancer Neg Hx    Stomach cancer Neg Hx    Liver cancer Neg Hx    Esophageal cancer Neg Hx     Past Surgical History:  Procedure Laterality Date   ABDOMINAL HYSTERECTOMY     BIV PACEMAKER GENERATOR CHANGEOUT N/A 08/30/2022   Procedure: BIV PACEMAKER GENERATOR CHANGEOUT;  Surgeon: Fernande Elspeth BROCKS, MD;  Location: [facility] INVASIVE CV LAB;  Service: Cardiovascular;  Laterality: N/A;   BREAST LUMPECTOMY WITH RADIOACTIVE SEED LOCALIZATION Right 01/05/2023   Procedure: RIGHT BREAST BRACKETED LUMPECTOMY WITH RADIOACTIVE SEED LOCALIZATION;  Surgeon: Vernetta Berg, MD;  Location: [facility] OR;  Service: General;  Laterality: Right;   CARDIAC CATHETERIZATION  08/2011   CAST APPLICATION Right 09/06/2023   Procedure: CAST APPLICATION;  Surgeon: Jerri Kay HERO, MD;  Location: [facility] OR;  Service: Orthopedics;  Laterality: Right;   CHOLECYSTECTOMY     COLONOSCOPY     EP Study  08/22/08   HARDWARE REMOVAL Right 08/01/2024   Procedure: REMOVAL, HARDWARE;  Surgeon: Harden Jerona GAILS, MD;  Location: [facility] OR;  Service:  Orthopedics;  Laterality: Right;  RIGHT ANKLE REMOVAL OF RETAINED HARDWARE   I & D EXTREMITY Right 09/06/2023   Procedure: IRRIGATION AND DEBRIDEMENT RIGHT ANKLE;  Surgeon: Jerri Kay HERO, MD;  Location: [facility] OR;  Service: Orthopedics;  Laterality: Right;   IMPLANTABLE CARDIOVERTER DEFIBRILLATOR GENERATOR CHANGE N/A 08/22/2013   Procedure: IMPLANTABLE CARDIOVERTER DEFIBRILLATOR GENERATOR CHANGE;  Surgeon: Elspeth BROCKS Fernande, MD;  Location: [facility] CATH LAB;  Service: Cardiovascular;  Laterality: N/A;   INCISION AND DRAINAGE OF DEEP ABSCESS, CALF Right 08/01/2024   Procedure: INCISION AND DRAINAGE OF DEEP ABSCESS, CALF;  Surgeon: Harden Jerona GAILS, MD;  Location: [facility] OR;  Service: Orthopedics;  Laterality: Right;  IRRIGATION AND DEBRIDEMENT RIGHT ANKLE   KNEE ARTHROSCOPY Left 04/14/2016   Procedure: LEFT KNEE ARTHROSCOPY, PARTIAL MEDIAL AND PARTIAL LATERAL MENISCECTOMY, MEDIAL PLICA;  Surgeon: Norleen Gavel, MD;  Location: [facility] OR;  Service: Orthopedics;  Laterality: Left;   mastoid sugery     in the setting of Meniere's disease   ORIF ANKLE FRACTURE Right 09/09/2023   Procedure: OPEN REDUCTION INTERNAL FIXATION (ORIF) ANKLE FRACTURE;  Surgeon: Harden Jerona GAILS, MD;  Location: [facility] OR;  Service: Orthopedics;  Laterality: Right;   TONSILLECTOMY AND ADENOIDECTOMY     Social History   Occupational History   Occupation: retired    Comment: runner, 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

## 2024-09-11 NOTE — Progress Notes (Signed)
 EPIC Encounter for ICM Monitoring  Patient Name: Grace Robinson is a 89 y.o. female Date: 09/11/2024 Primary Care Physican: Mast, Man X, NP Primary Cardiologist: Camnitz Electrophysiologist: Camnitz Bi-V Pacing:  97.9%     12/24/2022 Weight: 130 lbs 02/16/2024 Office Weight: 126 lbs 03/22/2024 Weight: 127 lbs per facility note 08/06/2024 Weight: 133 lbs per facility note   Since 04-Sep-2024 Time in AT/AF  0.0 hr/day (0.0%)         Patient reported on 09/04/2024 her feet have been swollen for the last few weeks.  Spoke with [facility] Nurse.  Reviewed remote transmission results.  She reported BNP was faxed on 09/06/2024 and was waiting on recommendations from Dr Inocencio office.  Per 09/06/2024 phone note, [name], NP was waiting on BNP results.           Currently resides in ALF [facility] (nurses desk number [phone] and fax orders [phone]).    Since 09/04/2024 ICM Remote Transmission: Optivol Thoracic impedance suggesting possible ongoing fluid accumulation starting 07/30/2024 and worsening after Lasix  decreased to once a day on 08/08/2024.   Prescribed:  Furosemide  40 mg take 1 tablet (40 mg total) by mouth twice a day.  09/11/2024 Per [facility] Nurse Tameka, Physician at [facility] decreased Lasix  to 40 mg once a day on 08/08/2024. Potassium 10 mEq Take 2 tablet(s) (20 mEq total) by mouth twice a day.   Labs: 09/04/2024 BNP 319 (scanned on 09/06/2024) 08/01/2024 Creatinine 1.46 (6:47 PM) 08/01/2024 Creatinine 1.20, BUN 27 Potassium 4.0. Sodium 139 (10:43 AM) 02/23/2024 Creatinine 0.9,   BUN 21, Potassium 4.1, Sodium 140 A complete set of results can be found in Results Review.   Recommendations:   Sent to Dr Inocencio for review and recommendations.  Orders will need to be faxed to [phone].          Follow-up plan: ICM clinic phone appointment on 09/17/2024.   91 day device clinic remote transmission 11/29/2023.      EP/Cardiology Office Visits:   Recall 06/02/2024 with [name], NP (6 month).       Copy of ICM check sent to Dr. Inocencio.      Remote monitoring is medically necessary for Heart Failure Management.    Daily Thoracic Impedance ICM trend: 06/11/2024 through 09/10/2024.    12-14 Month Thoracic Impedance ICM trend:     [name], RN 09/11/2024 9:02 AM

## 2024-09-11 NOTE — Progress Notes (Signed)
Attempted call to patient and no answer. 

## 2024-09-11 NOTE — Progress Notes (Signed)
 Clarified with Dr Inocencio that patient is taking Lasix  40 mg once a day.  His response below.  Dr Jesslyn nurse Aldona Marina will fax the the order to [facility].

## 2024-09-12 ENCOUNTER — Telehealth: Payer: Self-pay

## 2024-09-12 NOTE — Telephone Encounter (Signed)
 Returned call to [place] at [facility].  She confirmed they received Dr Carolyn verbal order to increase Lasix  to 40 mg twice a day x 4 days and [facility] Nurse Practitioner signed off on the verbal order.  No further signature or faxes needed.  Appreciated her call and the confirmation.  Advised will recheck fluid levels again on 09/17/2024 to see if the 4 day Lasix  increase resolved fluid accumulation.    Will copy Dr Carolyn nurse Maeola Domino, RN regarding order has been signed off and no further action needed.

## 2024-09-12 NOTE — Progress Notes (Signed)
 Spoke with Tameka at [facility].  She advised the verbal order was received to increase Lasix  to 40 mg bid x 4 days.  [facility] Nurse Practitioner has signed the verbal order and patient will start receiving the extra dose today.   Advised will check follow up report on 09/17/2024 and call her with results.  She was appreciative of the assistance.  She can be reached at [phone] with ext at her desk is [phone] and the nurses desk ext is [phone].

## 2024-09-12 NOTE — Progress Notes (Signed)
Attempted call to patient and left message.

## 2024-09-17 ENCOUNTER — Ambulatory Visit: Attending: Cardiology

## 2024-09-17 ENCOUNTER — Telehealth: Payer: Self-pay

## 2024-09-17 DIAGNOSIS — I5022 Chronic systolic (congestive) heart failure: Secondary | ICD-10-CM | POA: Diagnosis not present

## 2024-09-17 DIAGNOSIS — Z950 Presence of cardiac pacemaker: Secondary | ICD-10-CM

## 2024-09-17 NOTE — Telephone Encounter (Signed)
 Spoke with Tameka at assisted living facility.  Reviewed ICM Remote transmission results and advised fluid levels have improved and pt stats swelling of her feet have improved as well.  Advised patient is due for appointment to have device checked in the office and will have scheduler call to set one up.   Advised the plan is to continue to monitor and encouraged to call if patient complains of any fluid symptoms.

## 2024-09-17 NOTE — Progress Notes (Signed)
 EPIC Encounter for ICM Monitoring  Patient Name: Grace Robinson is a 89 y.o. female Date: 09/17/2024 Primary Care Physican: Mast, Man X, NP Primary Cardiologist: Camnitz Electrophysiologist: Camnitz Bi-V Pacing:  98.1%     12/24/2022 Weight: 130 lbs 02/16/2024 Office Weight: 126 lbs 03/22/2024 Weight: 127 lbs per facility note 08/06/2024 Weight: 133 lbs per facility note   Clinical Status  (10-Sep-2024 to 17-Sep-2024) Time in AT/AF  0.0 hr/day (0.0%)         Spoke with patient and heart failure questions reviewed.  Transmission results reviewed.  Pt reports swelling of her feet have improved and does resolve overnight.  She is not experiencing.  She is feeling okay at this time.           Spoke with [facility] Facility Nurse [phone])and advised the report suggesting fluid levels improved after taking Lasix  40 mg bid and patient reports swelling in her feet have improved.  Pt does not have any physician appointments scheduled at the facility and only scheduled as needed.          Currently resides in ALF [facility] (nurses desk number [phone] and fax orders [phone]).    Since 09/10/2024 ICM Remote Transmission: Optivol Thoracic impedance suggesting improvement after taking Lasix  40 mg bid x 4 days (starting 09/12/2024) but possible ongoing fluid accumulation continues (started 07/30/2024) .   Prescribed:  Furosemide  40 mg take 1 tablet (40 mg total) by mouth twice a day.  09/11/2024 Per [facility] Nurse Tameka, Physician at [facility] decreased Lasix  to 40 mg once a day on 08/08/2024. Potassium 10 mEq Take 2 tablet(s) (20 mEq total) by mouth twice a day.   Labs: 09/04/2024 BNP 319 (scanned on 09/06/2024) 08/01/2024 Creatinine 1.46 (6:47 PM) 08/01/2024 Creatinine 1.20, BUN 27 Potassium 4.0. Sodium 139 (10:43 AM) 02/23/2024 Creatinine 0.9,   BUN 21, Potassium 4.1, Sodium 140 A complete set of results can be found in Results Review.   Recommendations:   No changes and encouraged patient to call if swelling becomes worse or if other fluid symptoms occur.   Myles Renea, ALF nurse to call if patient develops any changes.    Follow-up plan: ICM clinic phone appointment on 09/24/2024 to recheck fluid levels.   91 day device clinic remote transmission 11/29/2023.      EP/Cardiology Office Visits:  Recall 06/02/2024 with [name], NP (6 month).    09/17/2024 Message sent to EP scheduling office to call patient for overdue 6 month appointment.   Copy of ICM check sent to Dr. Inocencio.      Remote monitoring is medically necessary for Heart Failure Management.    Daily Thoracic Impedance ICM trend: 06/18/2024 through 09/17/2024.    12-14 Month Thoracic Impedance ICM trend:     [name], RN 09/17/2024 8:20 AM

## 2024-09-19 ENCOUNTER — Encounter: Payer: Self-pay | Admitting: Adult Health

## 2024-09-19 ENCOUNTER — Non-Acute Institutional Stay: Payer: Self-pay | Admitting: Adult Health

## 2024-09-19 DIAGNOSIS — Z794 Long term (current) use of insulin: Secondary | ICD-10-CM

## 2024-09-19 DIAGNOSIS — E1165 Type 2 diabetes mellitus with hyperglycemia: Secondary | ICD-10-CM | POA: Diagnosis not present

## 2024-09-19 NOTE — Progress Notes (Signed)
 Location:  [facility] Room Number: 804 A Place of Service:  ALF (13) Provider:  Medina-Vargas, Artavia Jeanlouis, DNP, FNP-BC  Patient Care Team: Mast, Man X, NP as PCP - General (Internal Medicine) Fernande Elspeth BROCKS, MD (Inactive) as PCP - Electrophysiology (Cardiology) Vernetta Berg, MD as Consulting Physician (General Surgery) Loretha Ash, MD as Consulting Physician (Hematology and Oncology) Dewey Rush, MD as Consulting Physician (Radiation Oncology)  Extended Emergency Contact Information Primary Emergency Contact: Drury Fairy BRAVO Address: [address]          [place], [place] [address] [place] Home Phone: [phone] Mobile Phone: [phone] Relation: Son  Code Status:   DNR  Goals of care: Advanced Directive information    08/01/2024   10:49 AM  Advanced Directives  Does Patient Have a Medical Advance Directive? No  Would patient like information on creating a medical advance directive? No - Patient declined     Chief Complaint  Patient presents with   Acute Visit    Elevated blood sugars    HPI:  Pt is a 89 y.o. female seen today for an acute visit regarding elevated BP. She is a resident of [facility]. Blood sugar was HI yesterday and was given  additional 10 units of Novolog . This morning, blood sugar was 336. She is currently on Novolog  3 units pre meals, Novolog  sliding scale before meals and Toujeo  46 units daily for diabetes mellitus. Blood sugar log ranges from 186 to HI.   Past Medical History:  Diagnosis Date   Biventricular ICD (implantable cardiac defibrillator) in place    downgraded from CRT-D to CRT-P 08/30/22   Breast cancer (HCC)    Cellulitis    Change in bowel habits    CHF (congestive heart failure) (HCC)    Colon polyps    Diabetes mellitus    Type II   Diarrhea    Dyslipidemia    HOH (hard of hearing)    left   HTN (hypertension)    LBBB (left bundle branch block)     chronic   Meniere's disease    Nonischemic cardiomyopathy (HCC) 2006   Presence of permanent cardiac pacemaker    T wave oversensing 01/17/2014   Ulcerative colitis    Varicose veins    4 lazer  treatment each leg   Past Surgical History:  Procedure Laterality Date   ABDOMINAL HYSTERECTOMY     BIV PACEMAKER GENERATOR CHANGEOUT N/A 08/30/2022   Procedure: BIV PACEMAKER GENERATOR CHANGEOUT;  Surgeon: Fernande Elspeth BROCKS, MD;  Location: [facility] INVASIVE CV LAB;  Service: Cardiovascular;  Laterality: N/A;   BREAST LUMPECTOMY WITH RADIOACTIVE SEED LOCALIZATION Right 01/05/2023   Procedure: RIGHT BREAST BRACKETED LUMPECTOMY WITH RADIOACTIVE SEED LOCALIZATION;  Surgeon: Vernetta Berg, MD;  Location: [facility] OR;  Service: General;  Laterality: Right;   CARDIAC CATHETERIZATION  08/2011   CAST APPLICATION Right 09/06/2023   Procedure: CAST APPLICATION;  Surgeon: Jerri Kay HERO, MD;  Location: [facility] OR;  Service: Orthopedics;  Laterality: Right;   CHOLECYSTECTOMY     COLONOSCOPY     EP Study  08/22/08   HARDWARE REMOVAL Right 08/01/2024   Procedure: REMOVAL, HARDWARE;  Surgeon: Harden Jerona GAILS, MD;  Location: [facility] OR;  Service: Orthopedics;  Laterality: Right;  RIGHT ANKLE REMOVAL OF RETAINED HARDWARE   I & D EXTREMITY Right 09/06/2023   Procedure: IRRIGATION AND DEBRIDEMENT RIGHT ANKLE;  Surgeon: Jerri Kay HERO, MD;  Location: [facility] OR;  Service: Orthopedics;  Laterality:  Right;   IMPLANTABLE CARDIOVERTER DEFIBRILLATOR GENERATOR CHANGE N/A 08/22/2013   Procedure: IMPLANTABLE CARDIOVERTER DEFIBRILLATOR GENERATOR CHANGE;  Surgeon: Elspeth JAYSON Sage, MD;  Location: [facility] CATH LAB;  Service: Cardiovascular;  Laterality: N/A;   INCISION AND DRAINAGE OF DEEP ABSCESS, CALF Right 08/01/2024   Procedure: INCISION AND DRAINAGE OF DEEP ABSCESS, CALF;  Surgeon: Harden Jerona GAILS, MD;  Location: [facility] OR;  Service: Orthopedics;  Laterality: Right;  IRRIGATION AND DEBRIDEMENT RIGHT ANKLE   KNEE ARTHROSCOPY Left 04/14/2016   Procedure: LEFT KNEE  ARTHROSCOPY, PARTIAL MEDIAL AND PARTIAL LATERAL MENISCECTOMY, MEDIAL PLICA;  Surgeon: Norleen Gavel, MD;  Location: [facility] OR;  Service: Orthopedics;  Laterality: Left;   mastoid sugery     in the setting of Meniere's disease   ORIF ANKLE FRACTURE Right 09/09/2023   Procedure: OPEN REDUCTION INTERNAL FIXATION (ORIF) ANKLE FRACTURE;  Surgeon: Harden Jerona GAILS, MD;  Location: [facility] OR;  Service: Orthopedics;  Laterality: Right;   TONSILLECTOMY AND ADENOIDECTOMY      Allergies  Allergen Reactions   Coreg [Carvedilol] Other (See Comments)    Unknown reaction   Firvanq [Vancomycin] Itching and Other (See Comments)    Red Man Syndrome   Lactose Intolerance (Gi)    Lanoxin [Digoxin] Other (See Comments)    DIG toxicity   Sudafed [Pseudoephedrine] Other (See Comments)    Makes skin feel like its crawling     Outpatient Encounter Medications as of 09/19/2024  Medication Sig   acetaminophen  (TYLENOL ) 500 MG tablet Take 500 mg by mouth every 8 (eight) hours as needed for headache.   aspirin  EC 81 MG tablet Take 81 mg by mouth daily.   atorvastatin  (LIPITOR) 40 MG tablet TAKE 1 TABLET(40 MG) BY MOUTH DAILY   diclofenac  Sodium (VOLTAREN  ARTHRITIS PAIN) 1 % GEL Apply topically every 8 (eight) hours as needed (Apply to bilateral knees).   furosemide  (LASIX ) 40 MG tablet Take 40 mg by mouth 2 (two) times daily.   hydrALAZINE  (APRESOLINE ) 50 MG tablet Take 50 mg by mouth 2 (two) times daily.   HYDROcodone -acetaminophen  (NORCO/VICODIN) 5-325 MG tablet Take 1 tablet by mouth every 6 (six) hours as needed for moderate pain (pain score 4-6) or severe pain (pain score 7-10).   insulin  aspart (NOVOLOG ) 100 UNIT/ML injection Inject 3 Units into the skin 3 (three) times daily before meals. May give additional units as needed, see separate PRN order.   insulin  aspart (NOVOLOG ) 100 UNIT/ML injection Inject 10 Units into the skin 3 (three) times daily as needed for high blood sugar. Give with regular 3 unit dose if BG > 400    loperamide  (IMODIUM  A-D) 2 MG tablet Take 2 mg by mouth every 6 (six) hours as needed for diarrhea or loose stools.   metoprolol  succinate (TOPROL -XL) 100 MG 24 hr tablet TAKE 1 TABLET(100 MG) BY MOUTH TWICE DAILY (Patient taking differently: Take 100 mg by mouth daily.)   olmesartan  (BENICAR ) 40 MG tablet TAKE 1 TABLET(40 MG) BY MOUTH DAILY   ondansetron  (ZOFRAN ) 4 MG tablet Take 4 mg by mouth every 6 (six) hours as needed.   polyethylene glycol (MIRALAX  / GLYCOLAX ) 17 g packet Take 17 g by mouth daily as needed for moderate constipation.   potassium chloride  (KLOR-CON ) 10 MEQ tablet Take 20 mEq by mouth 2 (two) times daily.   risedronate (ACTONEL) 35 MG tablet Take 35 mg by mouth every Sunday. Give 1 tablet by mouth one time a day every Sun for post menopause   saccharomyces boulardii (FLORASTOR) 250  MG capsule Take 250 mg by mouth daily.   sulfamethoxazole-trimethoprim (BACTRIM DS) 800-160 MG tablet Take 1 tablet by mouth 2 (two) times daily.   sulfamethoxazole-trimethoprim (BACTRIM DS) 800-160 MG tablet Take 1 tablet by mouth 2 (two) times daily.   TOUJEO  MAX SOLOSTAR 300 UNIT/ML Solostar Pen ADMINISTER 35 UNITS UNDER THE SKIN DAILY (Patient taking differently: Inject 23-45 Units into the skin daily. If BG > 70 and food intake is normal, inject 45 units subcutaneously once daily. If BG < 70 or if patient eats less than 50% of meals, give 23 units instead.)   No facility-administered encounter medications on file as of 09/19/2024.    Review of Systems  Constitutional:  Negative for appetite change, chills, fatigue and fever.  HENT:  Negative for congestion, hearing loss, rhinorrhea and sore throat.   Eyes: Negative.   Respiratory:  Negative for cough, shortness of breath and wheezing.   Cardiovascular:  Negative for chest pain, palpitations and leg swelling.  Gastrointestinal:  Negative for abdominal pain, constipation, diarrhea, nausea and vomiting.  Genitourinary:  Negative for dysuria.   Musculoskeletal:  Negative for arthralgias, back pain and myalgias.  Skin:  Negative for color change, rash and wound.  Neurological:  Negative for dizziness, weakness and headaches.  Psychiatric/Behavioral:  Negative for behavioral problems. The patient is not nervous/anxious.      Immunization History  Administered Date(s) Administered   INFLUENZA, HIGH DOSE SEASONAL PF 10/06/2016, 10/05/2017, 09/14/2018, 08/17/2022, 08/31/2023   Influenza, Quadrivalent, Recombinant, Inj, Pf 08/01/2020   Influenza,inj,Quad PF,6+ Mos 10/02/2014   Moderna Covid-19 Vaccine Bivalent Booster 16yrs & up 08/17/2023   PNEUMOCOCCAL CONJUGATE-20 03/16/2023   PPD Test 02/22/2023   Pneumococcal-Unspecified 03/29/2022   Tdap 09/29/2022, 09/06/2023   Unspecified SARS-COV-2 Vaccination 11/05/2019, 12/03/2019, 03/31/2021, 07/21/2021, 03/19/2022   Zoster Recombinant(Shingrix) 03/07/2023   Pertinent  Health Maintenance Due  Topic Date Due   Mammogram  Never done   Influenza Vaccine  06/01/2024   OPHTHALMOLOGY EXAM  07/04/2024   HEMOGLOBIN A1C  01/31/2025   FOOT EXAM  04/18/2025   Bone Density Scan  Completed      05/19/2023    2:37 PM 06/02/2023    2:49 PM 06/09/2023    8:48 AM 07/08/2023   10:35 AM 07/29/2023    9:55 AM  Fall Risk  Falls in the past year? 1 1 1 1  0  Was there an injury with Fall? 1 1 1 1  0  Fall Risk Category Calculator 3 3 3 3  0  Patient at Risk for Falls Due to  Impaired balance/gait;History of fall(s) History of fall(s);Impaired balance/gait History of fall(s);Impaired balance/gait;Impaired mobility History of fall(s);Impaired balance/gait;Impaired mobility  Fall risk Follow up Falls evaluation completed Falls evaluation completed Falls evaluation completed Falls evaluation completed Falls evaluation completed     Vitals:   09/19/24 1437 09/19/24 1440  BP: (!) 150/79 (!) 140/65  Pulse: 73   Resp: 16   Temp: (!) 97.4 F (36.3 C)   SpO2: 98%   Weight: 136 lb 3.2 oz (61.8 kg)    Height: 5' 5 (1.651 m)    Body mass index is 22.66 kg/m.  Physical Exam Constitutional:      Appearance: Normal appearance.  HENT:     Head: Normocephalic and atraumatic.     Nose: Nose normal.     Mouth/Throat:     Mouth: Mucous membranes are moist.  Eyes:     Conjunctiva/sclera: Conjunctivae normal.  Cardiovascular:     Rate and Rhythm: Normal rate  and regular rhythm.  Pulmonary:     Effort: Pulmonary effort is normal.     Breath sounds: Normal breath sounds.  Abdominal:     General: Bowel sounds are normal.     Palpations: Abdomen is soft.  Musculoskeletal:        General: Normal range of motion.     Cervical back: Normal range of motion.  Skin:    General: Skin is warm and dry.  Neurological:     General: No focal deficit present.     Mental Status: She is alert and oriented to person, place, and time.  Psychiatric:        Mood and Affect: Mood normal.        Behavior: Behavior normal.     Labs reviewed: Recent Labs    10/07/23 0000 01/20/24 1346 02/23/24 0000 08/01/24 1043 08/01/24 1847  NA 142 137 140 139  --   K 4.2 3.7 4.1 4.0  --   CL 105 99 105 102  --   CO2 28* 26 23*  --   --   GLUCOSE  --  283*  --  209*  --   BUN 17 22 21  27*  --   CREATININE 0.9 0.85 0.9 1.20* 1.46*  CALCIUM  8.5* 9.0 9.0  --   --    Recent Labs    09/27/23 0000 09/28/23 0000 10/07/23 0000  AST  --  15 20  ALT  --  16 16  ALKPHOS  --  147* 103  ALBUMIN 3.3*  --  3.3*   Recent Labs    09/28/23 0000 10/07/23 0000 01/20/24 1346 08/01/24 1043 08/01/24 1847  WBC 4.9 4.6 6.8  --  7.3  NEUTROABS 3,244.00 2,838.00 5.6  --   --   HGB 11.0* 11.6* 14.0 11.2* 10.8*  HCT 34* 35* 43.7 33.0* 32.2*  MCV  --   --  97.5  --  95.5  PLT 138* 145* 157  --  197   Lab Results  Component Value Date   TSH 1.93 02/25/2023   Lab Results  Component Value Date   HGBA1C 8.0 (H) 08/02/2024   Lab Results  Component Value Date   CHOL 116 02/25/2023   HDL 35 02/25/2023   LDLCALC  58 02/25/2023   TRIG 144 02/25/2023   CHOLHDL 2.7 11/10/2020    Significant Diagnostic Results in last 30 days:  CUP PACEART REMOTE DEVICE CHECK Result Date: 08/30/2024 Pacemaker: Scheduled remote reviewed. Normal device function.  Presenting rhythm: AS/VP 3 NSVT, V>A, 6-14 beats, V-rates 158-174 bpm Abnormal HF diagnostics this monitoring period, followed by HF, sent to triage for increasing optivol Next remote transmission per protocol. ML, CVRS   Assessment/Plan  1. Type 2 diabetes mellitus with hyperglycemia, with long-term current use of insulin  (HCC) (Primary) Lab Results  Component Value Date   HGBA1C 8.0 (H) 08/02/2024    -   will increase Novolog  fro 3 units to 5 units SQ pre meals -   continue Novolog  sliding scale pre meals -   continue Toujeo  46 units daily     Family/ staff Communication: Discussed plan of care with resident and charge nurse  Labs/tests ordered: None    Jereld Serum, DNP, MSN, FNP-BC [facility] [phone] (Monday-Friday 8:00 a.m. - 5:00 p.m.) [phone] (after hours)

## 2024-09-20 DIAGNOSIS — E113292 Type 2 diabetes mellitus with mild nonproliferative diabetic retinopathy without macular edema, left eye: Secondary | ICD-10-CM | POA: Diagnosis not present

## 2024-09-24 ENCOUNTER — Telehealth: Payer: Self-pay

## 2024-09-24 ENCOUNTER — Ambulatory Visit: Attending: Cardiology

## 2024-09-24 DIAGNOSIS — I5022 Chronic systolic (congestive) heart failure: Secondary | ICD-10-CM

## 2024-09-24 DIAGNOSIS — Z950 Presence of cardiac pacemaker: Secondary | ICD-10-CM

## 2024-09-24 NOTE — Telephone Encounter (Signed)
 Spoke with Grace Robinson.  Confirmed received her voice mail and orders will be faxed today.  Advised EP office visit scheduled for 10/23/2024 and encouraged to call if any changes.

## 2024-09-24 NOTE — Telephone Encounter (Signed)
 Attempted ICM call to [facility], patient's nurse, at [facility] [phone] ext [phone].  Left voice mail message with number to return call.

## 2024-09-24 NOTE — Progress Notes (Signed)
  Received: Today Inocencio Soyla Lunger, MD  Cyruss Arata, Mitzie RAMAN, RN Would increase lasix  to BID for 4 days. If still SOB will need clinic appt with ep app.

## 2024-09-24 NOTE — Progress Notes (Signed)
 Received call back from patient's nurse, Tameka and she confimed to fax orders to [phone].    Copy sent to Maeola Domino, RN. Dr Carolyn nurse to fax orders to increase Lasix  40 mg to twice a day x 4 days.  Pt has EP appt on 10/23/2024.

## 2024-09-24 NOTE — Progress Notes (Signed)
 EPIC Encounter for ICM Monitoring  Patient Name: Grace Robinson is a 89 y.o. female Date: 09/24/2024 Primary Care Physican: Mast, Man X, NP Primary Cardiologist: Camnitz Electrophysiologist: Camnitz Bi-V Pacing:  97.9%     12/24/2022 Weight: 130 lbs 02/16/2024 Office Weight: 126 lbs 03/22/2024 Weight: 127 lbs per facility note 08/06/2024 Weight: 133 lbs per facility note 09/24/2024 Weight: 136 lbs   Clinical Status  Since 17-Sep-2024 Time in AT/AF  0.0 hr/day (0.0%)         Spoke with patient and heart failure questions reviewed.  Transmission results reviewed.  Pt reports ongoing swelling of her feet.  Weight increase 3 lbs from baseline.        Currently resides in ALF [facility] (nurses desk number [phone] and fax orders [phone]).  Facility Physician appointments are scheduled on as needed basis.    Since 09/17/2024 ICM Remote Transmission: Optivol Thoracic impedance suggesting ongoing fluid accumulation continues (started 07/30/2024 after lasix  dosage decreased on 08/08/2024).   Prescribed:  Furosemide  40 mg take 1 tablet (40 mg total) by mouth twice a day.  09/11/2024 Per [facility] Nurse Tameka, Physician at [facility] decreased Lasix  to 40 mg once a day on 08/08/2024. Potassium 10 mEq Take 2 tablet(s) (20 mEq total) by mouth twice a day.   Labs: 09/04/2024 BNP 319 (scanned on 09/06/2024) 08/01/2024 Creatinine 1.46 (6:47 PM) 08/01/2024 Creatinine 1.20, BUN 27 Potassium 4.0. Sodium 139 (10:43 AM) 02/23/2024 Creatinine 0.9,   BUN 21, Potassium 4.1, Sodium 140 A complete set of results can be found in Results Review.   Recommendations:  Sent to Dr Inocencio for review and recommendations.   Any orders will need to be faxed to [facility] at [phone].   Follow-up plan: ICM clinic phone appointment on 10/15/2024 to recheck fluid levels.   91 day device clinic remote transmission 11/29/2023.      EP/Cardiology Office Visits:  10/24/2024 with [name], NP (6 month).        Copy of ICM check sent to Dr. Inocencio.      Remote monitoring is medically necessary for Heart Failure Management.    Daily Thoracic Impedance ICM trend: 06/22/2024 through 09/24/2024.    12-14 Month Thoracic Impedance ICM trend:     [name], RN 09/24/2024 10:18 AM

## 2024-09-25 ENCOUNTER — Encounter: Payer: Self-pay | Admitting: Family

## 2024-09-25 ENCOUNTER — Ambulatory Visit: Admitting: Family

## 2024-09-25 DIAGNOSIS — Z9889 Other specified postprocedural states: Secondary | ICD-10-CM

## 2024-09-25 DIAGNOSIS — Z8781 Personal history of (healed) traumatic fracture: Secondary | ICD-10-CM

## 2024-09-25 DIAGNOSIS — T847XXA Infection and inflammatory reaction due to other internal orthopedic prosthetic devices, implants and grafts, initial encounter: Secondary | ICD-10-CM

## 2024-09-25 DIAGNOSIS — T8130XA Disruption of wound, unspecified, initial encounter: Secondary | ICD-10-CM

## 2024-09-25 NOTE — Progress Notes (Unsigned)
 Post-Op Visit Note   Patient: Grace Robinson           Date of Birth: 02/02/1926           MRN: [id number] Visit Date: 09/25/2024 PCP: Mast, Man X, NP  Chief Complaint:  Chief Complaint  Patient presents with   Right Ankle - Routine Post Op    08/01/2024 right ankle removal HDW    HPI:  HPI The patient is a 89 year old woman seen status post right ankle hardware removal October 10 unfortunately had dehiscence of her lateral ankle incision Ortho Exam On examination right ankle laterally there is area of dehiscence which now measures 4 cm x 8 mm with 2 mm of depth this is filled in with about 90% fibrinous exudative tissue there is no surrounding erythema or maceration  Visit Diagnoses: No diagnosis found.  Plan: Instructions provided daily Dial  soap or Vashe cleansing of the wound then apply Vashe to dry dressing change  Follow-Up Instructions: No follow-ups on file.   Imaging: No results found.  Orders:  No orders of the defined types were placed in this encounter.  No orders of the defined types were placed in this encounter.    PMFS History: Patient Active Problem List   Diagnosis Date Noted   Bone infection, ankle/foot (HCC) 08/01/2024   Hardware complicating wound infection 08/01/2024   Lower limb ulcer, ankle, right, with necrosis of bone (HCC) 08/01/2024   Infection 08/01/2024   Numbness of right hand 11/03/2023   Right calf pain 10/10/2023   Cellulitis 09/26/2023   IBS (irritable bowel syndrome) 09/10/2023   Open wound of right ankle 09/09/2023   Hypokalemia 09/08/2023   Dislocation of ankle, right, open, initial encounter 09/07/2023   Open fracture of right distal fibula 09/06/2023   COVID-19 virus infection 06/09/2023   Gait abnormality 05/19/2023   Osteoarthritis 05/03/2023   Radiation dermatitis 05/03/2023   Osteopenia after menopause 03/04/2023   Closed pelvic fracture (HCC) 02/23/2023   Lower back pain 02/23/2023   Hydronephrosis 02/23/2023    Sigmoid diverticulitis 02/23/2023   Aortic atherosclerosis 02/23/2023   Hyperlipidemia associated with type 2 diabetes mellitus (HCC) 02/23/2023   Vitamin B12 deficiency 02/23/2023   Vitamin D  deficiency 02/23/2023   Type 2 diabetes mellitus with renal complication (HCC) 02/23/2023   S/P lumpectomy, right breast 01/05/2023   Ductal carcinoma in situ (DCIS) of right breast 12/06/2022   NICM (nonischemic cardiomyopathy) (HCC) 07/30/2021   Degenerative tear of posterior horn of medial meniscus of left knee 04/14/2016   Tear of lateral meniscus of left knee 04/14/2016   Synovial plica of left knee 04/14/2016   Mechanical complication of implantable cardioverter-defibrillator (ICD) 01/17/2014   Fall 07/10/2013   Paroxysmal ventricular tachycardia (HCC) 12/13/2012   Biventricular implantable cardioverter-defibrillator -Medtronic 12/08/2011   Atrial tachycardia 12/08/2011   HTN (hypertension) 05/19/2011   CARDIOMYOPATHY, PRIMARY, DILATED 12/01/2010   SYSTOLIC HEART FAILURE, CHRONIC 12/01/2010   Past Medical History:  Diagnosis Date   Biventricular ICD (implantable cardiac defibrillator) in place    downgraded from CRT-D to CRT-P 08/30/22   Breast cancer (HCC)    Cellulitis    Change in bowel habits    CHF (congestive heart failure) (HCC)    Colon polyps    Diabetes mellitus    Type II   Diarrhea    Dyslipidemia    HOH (hard of hearing)    left   HTN (hypertension)    LBBB (left bundle branch block)    chronic  Meniere's disease    Nonischemic cardiomyopathy (HCC) 2006   Presence of permanent cardiac pacemaker    T wave oversensing 01/17/2014   Ulcerative colitis    Varicose veins    4 lazer  treatment each leg    Family History  Problem Relation Age of Onset   Breast cancer Mother 22       met to lungs   Emphysema Father 17   Heart disease Father    Healthy Child    Lung cancer Cousin        maternal first cousin, she smoked   Colon cancer Neg Hx    Rectal cancer  Neg Hx    Stomach cancer Neg Hx    Liver cancer Neg Hx    Esophageal cancer Neg Hx     Past Surgical History:  Procedure Laterality Date   ABDOMINAL HYSTERECTOMY     BIV PACEMAKER GENERATOR CHANGEOUT N/A 08/30/2022   Procedure: BIV PACEMAKER GENERATOR CHANGEOUT;  Surgeon: Fernande Elspeth BROCKS, MD;  Location: [facility] INVASIVE CV LAB;  Service: Cardiovascular;  Laterality: N/A;   BREAST LUMPECTOMY WITH RADIOACTIVE SEED LOCALIZATION Right 01/05/2023   Procedure: RIGHT BREAST BRACKETED LUMPECTOMY WITH RADIOACTIVE SEED LOCALIZATION;  Surgeon: Vernetta Berg, MD;  Location: [facility] OR;  Service: General;  Laterality: Right;   CARDIAC CATHETERIZATION  08/2011   CAST APPLICATION Right 09/06/2023   Procedure: CAST APPLICATION;  Surgeon: Jerri Kay HERO, MD;  Location: [facility] OR;  Service: Orthopedics;  Laterality: Right;   CHOLECYSTECTOMY     COLONOSCOPY     EP Study  08/22/08   HARDWARE REMOVAL Right 08/01/2024   Procedure: REMOVAL, HARDWARE;  Surgeon: Harden Jerona GAILS, MD;  Location: [facility] OR;  Service: Orthopedics;  Laterality: Right;  RIGHT ANKLE REMOVAL OF RETAINED HARDWARE   I & D EXTREMITY Right 09/06/2023   Procedure: IRRIGATION AND DEBRIDEMENT RIGHT ANKLE;  Surgeon: Jerri Kay HERO, MD;  Location: [facility] OR;  Service: Orthopedics;  Laterality: Right;   IMPLANTABLE CARDIOVERTER DEFIBRILLATOR GENERATOR CHANGE N/A 08/22/2013   Procedure: IMPLANTABLE CARDIOVERTER DEFIBRILLATOR GENERATOR CHANGE;  Surgeon: Elspeth BROCKS Fernande, MD;  Location: [facility] CATH LAB;  Service: Cardiovascular;  Laterality: N/A;   INCISION AND DRAINAGE OF DEEP ABSCESS, CALF Right 08/01/2024   Procedure: INCISION AND DRAINAGE OF DEEP ABSCESS, CALF;  Surgeon: Harden Jerona GAILS, MD;  Location: [facility] OR;  Service: Orthopedics;  Laterality: Right;  IRRIGATION AND DEBRIDEMENT RIGHT ANKLE   KNEE ARTHROSCOPY Left 04/14/2016   Procedure: LEFT KNEE ARTHROSCOPY, PARTIAL MEDIAL AND PARTIAL LATERAL MENISCECTOMY, MEDIAL PLICA;  Surgeon: Norleen Gavel, MD;  Location: [facility] OR;  Service:  Orthopedics;  Laterality: Left;   mastoid sugery     in the setting of Meniere's disease   ORIF ANKLE FRACTURE Right 09/09/2023   Procedure: OPEN REDUCTION INTERNAL FIXATION (ORIF) ANKLE FRACTURE;  Surgeon: Harden Jerona GAILS, MD;  Location: [facility] OR;  Service: Orthopedics;  Laterality: Right;   TONSILLECTOMY AND ADENOIDECTOMY     Social History   Occupational History   Occupation: retired    Comment: runner, 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

## 2024-10-04 ENCOUNTER — Other Ambulatory Visit (HOSPITAL_COMMUNITY): Payer: Self-pay

## 2024-10-05 ENCOUNTER — Encounter: Payer: Self-pay | Admitting: Nurse Practitioner

## 2024-10-05 ENCOUNTER — Non-Acute Institutional Stay: Payer: Self-pay | Admitting: Nurse Practitioner

## 2024-10-05 DIAGNOSIS — Z780 Asymptomatic menopausal state: Secondary | ICD-10-CM

## 2024-10-05 DIAGNOSIS — E1122 Type 2 diabetes mellitus with diabetic chronic kidney disease: Secondary | ICD-10-CM

## 2024-10-05 DIAGNOSIS — M8580 Other specified disorders of bone density and structure, unspecified site: Secondary | ICD-10-CM | POA: Diagnosis not present

## 2024-10-05 DIAGNOSIS — Z794 Long term (current) use of insulin: Secondary | ICD-10-CM

## 2024-10-05 DIAGNOSIS — I10 Essential (primary) hypertension: Secondary | ICD-10-CM

## 2024-10-05 DIAGNOSIS — I5022 Chronic systolic (congestive) heart failure: Secondary | ICD-10-CM | POA: Diagnosis not present

## 2024-10-05 DIAGNOSIS — N183 Chronic kidney disease, stage 3 unspecified: Secondary | ICD-10-CM | POA: Diagnosis not present

## 2024-10-05 DIAGNOSIS — E538 Deficiency of other specified B group vitamins: Secondary | ICD-10-CM | POA: Diagnosis not present

## 2024-10-05 DIAGNOSIS — S91001D Unspecified open wound, right ankle, subsequent encounter: Secondary | ICD-10-CM

## 2024-10-05 DIAGNOSIS — E559 Vitamin D deficiency, unspecified: Secondary | ICD-10-CM

## 2024-10-05 MED ORDER — FUROSEMIDE 20 MG PO TABS: 20.0000 mg | ORAL_TABLET | Freq: Every day | ORAL | 3 refills | Status: DC

## 2024-10-05 NOTE — Assessment & Plan Note (Signed)
 T2DM/renal complication, basal and bolus insulin , creat 1.46 08/01/24 Hgb>>0.75 09/04/24,  A1c 8.0 08/02/24

## 2024-10-05 NOTE — Assessment & Plan Note (Signed)
takes Vit B12

## 2024-10-05 NOTE — Assessment & Plan Note (Signed)
 Hospitalized 08/2024 for infected hardware, removal hardware, I+D of deep abscess, medical R ankle I+D site is non healing, no s/s of infection.   R ankle fx, ORIF 10/2023

## 2024-10-05 NOTE — Progress Notes (Addendum)
 Location:   [facility] Room Number: 804 Place of Service:  ALF (13) Provider: Larwance Iveliz Garay NP  Sharena Dibenedetto X, NP  Patient Care Team: Mell Guia X, NP as PCP - General (Internal Medicine) Fernande Elspeth BROCKS, MD (Inactive) as PCP - Electrophysiology (Cardiology) Vernetta Berg, MD as Consulting Physician (General Surgery) Loretha Ash, MD as Consulting Physician (Hematology and Oncology) Dewey Rush, MD as Consulting Physician (Radiation Oncology)  Extended Emergency Contact Information Primary Emergency Contact: Drury Fairy BRAVO Address: [address]          [place], [place] [address] [place] Home Phone: [phone] Mobile Phone: [phone] Relation: Son  Code Status:  DNR Goals of care: Advanced Directive information    08/01/2024   10:49 AM  Advanced Directives  Does Patient Have a Medical Advance Directive? No  Would patient like information on creating a medical advance directive? No - Patient declined     Chief Complaint  Patient presents with   Medical Management of Chronic Issues    HPI:  Pt is a 89 y.o. female seen today for medical management of chronic diseases.     Hospitalized 08/2024 for infected hardware, removal hardware, I+D of deep abscess, medical R ankle I+D site is non healing, no s/s of infection.   R ankle fx, ORIF 10/2023              Healed multiple fractures sustained from fall 02/18/23: pelvic fractures: nondisplaced fxs of R superior and inferior pubic rami, minimally displaced fx of the inferior R sacral ala extended to the R SIJ. Mild compression fx of superior endplates of T12, L2. Prn Tylenol   Fall, mechanical T2DM/renal complication, basal and bolus insulin , creat 1.46 08/01/24>>0.75 09/04/24, Hgb A1c 8.0 08/02/24 Mild bilateral hydronephrosis, no stones Sigmoid diverticulitis/IBS taking Bentyl , Imodium , on and off diarrhea Aortic atherosclerosis, taking ASA, Atorvastatin  HLD, taking Atorvastatin , LDL 58  02/24/23 Vit B12 deficiency, takes Vit B12, Hgb 10.2 07/31/24 Vit D deficiency, on Vit D, Vit D 41 02/24/23 CHF, more swelling in BLE, taking Furosemide , BNP 319 09/04/24 HTN, taking Metoprolol , Olmesartan  OP 03/03/23 DEXA t score -1.236, on Ca, Vit D, on Fosamax  70mg  qwk              S/p R breat cancer lumpectomy, f/u surgeon, 03/15/23 cancer center radiation tx             Radiation dermatitis right upper chest/breast, healed.        Past Medical History:  Diagnosis Date   Biventricular ICD (implantable cardiac defibrillator) in place    downgraded from CRT-D to CRT-P 08/30/22   Breast cancer (HCC)    Cellulitis    Change in bowel habits    CHF (congestive heart failure) (HCC)    Colon polyps    Diabetes mellitus    Type II   Diarrhea    Dyslipidemia    HOH (hard of hearing)    left   HTN (hypertension)    LBBB (left bundle branch block)    chronic   Meniere's disease    Nonischemic cardiomyopathy (HCC) 2006   Presence of permanent cardiac pacemaker    T wave oversensing 01/17/2014   Ulcerative colitis    Varicose veins    4 lazer  treatment each leg   Past Surgical History:  Procedure Laterality Date   ABDOMINAL HYSTERECTOMY     BIV PACEMAKER GENERATOR CHANGEOUT N/A 08/30/2022   Procedure: BIV PACEMAKER GENERATOR CHANGEOUT;  Surgeon: Fernande Elspeth BROCKS,  MD;  Location: [facility] INVASIVE CV LAB;  Service: Cardiovascular;  Laterality: N/A;   BREAST LUMPECTOMY WITH RADIOACTIVE SEED LOCALIZATION Right 01/05/2023   Procedure: RIGHT BREAST BRACKETED LUMPECTOMY WITH RADIOACTIVE SEED LOCALIZATION;  Surgeon: Vernetta Berg, MD;  Location: [facility] OR;  Service: General;  Laterality: Right;   CARDIAC CATHETERIZATION  08/2011   CAST APPLICATION Right 09/06/2023   Procedure: CAST APPLICATION;  Surgeon: Jerri Kay HERO, MD;  Location: [facility] OR;  Service: Orthopedics;  Laterality: Right;   CHOLECYSTECTOMY     COLONOSCOPY     EP Study  08/22/08   HARDWARE REMOVAL Right 08/01/2024   Procedure: REMOVAL,  HARDWARE;  Surgeon: Harden Jerona GAILS, MD;  Location: [facility] OR;  Service: Orthopedics;  Laterality: Right;  RIGHT ANKLE REMOVAL OF RETAINED HARDWARE   I & D EXTREMITY Right 09/06/2023   Procedure: IRRIGATION AND DEBRIDEMENT RIGHT ANKLE;  Surgeon: Jerri Kay HERO, MD;  Location: [facility] OR;  Service: Orthopedics;  Laterality: Right;   IMPLANTABLE CARDIOVERTER DEFIBRILLATOR GENERATOR CHANGE N/A 08/22/2013   Procedure: IMPLANTABLE CARDIOVERTER DEFIBRILLATOR GENERATOR CHANGE;  Surgeon: Elspeth JAYSON Sage, MD;  Location: [facility] CATH LAB;  Service: Cardiovascular;  Laterality: N/A;   INCISION AND DRAINAGE OF DEEP ABSCESS, CALF Right 08/01/2024   Procedure: INCISION AND DRAINAGE OF DEEP ABSCESS, CALF;  Surgeon: Harden Jerona GAILS, MD;  Location: [facility] OR;  Service: Orthopedics;  Laterality: Right;  IRRIGATION AND DEBRIDEMENT RIGHT ANKLE   KNEE ARTHROSCOPY Left 04/14/2016   Procedure: LEFT KNEE ARTHROSCOPY, PARTIAL MEDIAL AND PARTIAL LATERAL MENISCECTOMY, MEDIAL PLICA;  Surgeon: Norleen Gavel, MD;  Location: [facility] OR;  Service: Orthopedics;  Laterality: Left;   mastoid sugery     in the setting of Meniere's disease   ORIF ANKLE FRACTURE Right 09/09/2023   Procedure: OPEN REDUCTION INTERNAL FIXATION (ORIF) ANKLE FRACTURE;  Surgeon: Harden Jerona GAILS, MD;  Location: [facility] OR;  Service: Orthopedics;  Laterality: Right;   TONSILLECTOMY AND ADENOIDECTOMY      Allergies  Allergen Reactions   Coreg [Carvedilol] Other (See Comments)    Unknown reaction   Firvanq  [Vancomycin ] Itching and Other (See Comments)    Red Timothy Townsel Syndrome   Lactose Intolerance (Gi)    Lanoxin [Digoxin] Other (See Comments)    DIG toxicity   Sudafed [Pseudoephedrine] Other (See Comments)    Makes skin feel like its crawling     Allergies as of 10/05/2024       Reactions   Coreg [carvedilol] Other (See Comments)   Unknown reaction   Firvanq  [vancomycin ] Itching, Other (See Comments)   Red Alban Marucci Syndrome   Lactose Intolerance (gi)    Lanoxin [digoxin] Other (See  Comments)   DIG toxicity   Sudafed [pseudoephedrine] Other (See Comments)   Makes skin feel like its crawling        Medication List        Accurate as of October 05, 2024 11:59 PM. If you have any questions, ask your nurse or doctor.          acetaminophen  500 MG tablet Commonly known as: TYLENOL  Take 500 mg by mouth every 8 (eight) hours as needed for headache.   aspirin  EC 81 MG tablet Take 81 mg by mouth daily.   atorvastatin  40 MG tablet Commonly known as: LIPITOR TAKE 1 TABLET(40 MG) BY MOUTH DAILY   furosemide  40 MG tablet Commonly known as: LASIX  Take 40 mg by mouth 2 (two) times daily. What changed: Another medication with the same name was added. Make sure you understand how and  when to take each. Changed by: Keniya Schlotterbeck X Laiza Veenstra   furosemide  20 MG tablet Commonly known as: LASIX  Take 1 tablet (20 mg total) by mouth daily. What changed: You were already taking a medication with the same name, and this prescription was added. Make sure you understand how and when to take each. Changed by: Zaylen Susman X Nyisha Clippard   hydrALAZINE  50 MG tablet Commonly known as: APRESOLINE  Take 50 mg by mouth 2 (two) times daily.   HYDROcodone -acetaminophen  5-325 MG tablet Commonly known as: NORCO/VICODIN Take 1 tablet by mouth every 6 (six) hours as needed for moderate pain (pain score 4-6) or severe pain (pain score 7-10).   insulin  aspart 100 UNIT/ML injection Commonly known as: novoLOG  Inject 3 Units into the skin 3 (three) times daily before meals. May give additional units as needed, see separate PRN order.   insulin  aspart 100 UNIT/ML injection Commonly known as: novoLOG  Inject 10 Units into the skin 3 (three) times daily as needed for high blood sugar. Give with regular 3 unit dose if BG > 400   loperamide  2 MG tablet Commonly known as: IMODIUM  A-D Take 2 mg by mouth every 6 (six) hours as needed for diarrhea or loose stools.   metoprolol  succinate 100 MG 24 hr tablet Commonly known  as: TOPROL -XL TAKE 1 TABLET(100 MG) BY MOUTH TWICE DAILY What changed: See the new instructions.   olmesartan  40 MG tablet Commonly known as: BENICAR  TAKE 1 TABLET(40 MG) BY MOUTH DAILY   ondansetron  4 MG tablet Commonly known as: ZOFRAN  Take 4 mg by mouth every 6 (six) hours as needed.   polyethylene glycol 17 g packet Commonly known as: MIRALAX  / GLYCOLAX  Take 17 g by mouth daily as needed for moderate constipation.   potassium chloride  10 MEQ tablet Commonly known as: KLOR-CON  Take 20 mEq by mouth 2 (two) times daily.   risedronate 35 MG tablet Commonly known as: ACTONEL Take 35 mg by mouth every Sunday. Give 1 tablet by mouth one time a day every Sun for post menopause   saccharomyces boulardii 250 MG capsule Commonly known as: FLORASTOR Take 250 mg by mouth daily.   sulfamethoxazole -trimethoprim  800-160 MG tablet Commonly known as: BACTRIM  DS Take 1 tablet by mouth 2 (two) times daily.   sulfamethoxazole -trimethoprim  800-160 MG tablet Commonly known as: BACTRIM  DS Take 1 tablet by mouth 2 (two) times daily.   Toujeo  Max SoloStar 300 UNIT/ML Solostar Pen Generic drug: insulin  glargine (2 Unit Dial ) ADMINISTER 35 UNITS UNDER THE SKIN DAILY What changed: See the new instructions.   Voltaren  Arthritis Pain 1 % Gel Generic drug: diclofenac  Sodium Apply topically every 8 (eight) hours as needed (Apply to bilateral knees).        Review of Systems  Constitutional:  Negative for appetite change, fatigue and fever.  HENT:  Positive for hearing loss. Negative for congestion and trouble swallowing.   Eyes:  Negative for visual disturbance.  Respiratory:  Negative for cough, chest tightness and wheezing.   Cardiovascular:  Positive for leg swelling.  Gastrointestinal:  Negative for abdominal pain, blood in stool and diarrhea.  Genitourinary:  Negative for dysuria and urgency.  Musculoskeletal:  Positive for arthralgias, back pain and gait problem.       Right lower  leg pain occasionally Chronic R+L knee pain, ambulates with walker, s/p inj  Skin:  Positive for wound.  Neurological:  Negative for tremors and headaches.  Psychiatric/Behavioral:  Negative for behavioral problems and sleep disturbance. The patient is not nervous/anxious.  Immunization History  Administered Date(s) Administered   INFLUENZA, HIGH DOSE SEASONAL PF 10/06/2016, 10/05/2017, 09/14/2018, 08/17/2022, 08/31/2023   Influenza, Quadrivalent, Recombinant, Inj, Pf 08/01/2020   Influenza,inj,Quad PF,6+ Mos 10/02/2014   Moderna Covid-19 Vaccine Bivalent Booster 31yrs & up 08/17/2023   PNEUMOCOCCAL CONJUGATE-20 03/16/2023   PPD Test 02/22/2023   Pneumococcal-Unspecified 03/29/2022   Tdap 09/29/2022, 09/06/2023   Unspecified SARS-COV-2 Vaccination 11/05/2019, 12/03/2019, 03/31/2021, 07/21/2021, 03/19/2022   Zoster Recombinant(Shingrix) 03/07/2023   Pertinent  Health Maintenance Due  Topic Date Due   Mammogram  Never done   Influenza Vaccine  06/01/2024   OPHTHALMOLOGY EXAM  07/04/2024   HEMOGLOBIN A1C  01/31/2025   FOOT EXAM  04/18/2025   Bone Density Scan  Completed      05/19/2023    2:37 PM 06/02/2023    2:49 PM 06/09/2023    8:48 AM 07/08/2023   10:35 AM 07/29/2023    9:55 AM  Fall Risk  Falls in the past year? 1 1 1 1  0  Was there an injury with Fall? 1  1  1  1   0   Fall Risk Category Calculator 3 3 3 3  0  Patient at Risk for Falls Due to  Impaired balance/gait;History of fall(s) History of fall(s);Impaired balance/gait History of fall(s);Impaired balance/gait;Impaired mobility History of fall(s);Impaired balance/gait;Impaired mobility  Fall risk Follow up Falls evaluation completed Falls evaluation completed Falls evaluation completed Falls evaluation completed Falls evaluation completed     Data saved with a previous flowsheet row definition   Functional Status Survey:    Vitals:   10/05/24 1311 10/05/24 1312  BP: (!) 150/79 (!) 140/65  Resp: 16   Temp: (!) 97.4  F (36.3 C)   SpO2: 98%   Weight: 132 lb 9.6 oz (60.1 kg)    Body mass index is 22.07 kg/m. Physical Exam Vitals and nursing note reviewed.  Constitutional:      Appearance: Normal appearance.  HENT:     Head: Normocephalic and atraumatic.     Nose: Nose normal.     Mouth/Throat:     Mouth: Mucous membranes are moist.  Eyes:     Extraocular Movements: Extraocular movements intact.     Conjunctiva/sclera: Conjunctivae normal.     Pupils: Pupils are equal, round, and reactive to light.  Cardiovascular:     Rate and Rhythm: Normal rate and regular rhythm.     Heart sounds: No murmur heard.    Comments: Pacemaker left upper chest(defibrillator was removed per patient's request in the past) No dorsalis pedis pulses or posterior tibial pulses felt.  Pulmonary:     Effort: Pulmonary effort is normal.     Breath sounds: No rales.  Abdominal:     General: Bowel sounds are normal.     Palpations: Abdomen is soft.     Tenderness: There is no abdominal tenderness. There is no right CVA tenderness, left CVA tenderness, guarding or rebound.  Musculoskeletal:     Cervical back: Normal range of motion and neck supple.     Right lower leg: Edema present.     Left lower leg: Edema present.     Comments: Lower back, pelvic/thigh pain with movement/weight bearing.  Edema 2-3 BLE R>L Pain in the lower right leg above the right ankle pain when walking, no redness, warmth, open wound, or deformity.   Skin:    General: Skin is warm and dry.     Findings: Erythema present.     Comments: S/p R breat cancer lumpectomy,  no apparent edema R arm.  Open wound lateral R ankle where had I+D and hardware removal, no s/s of infection presently.     Neurological:     General: No focal deficit present.     Mental Status: She is alert and oriented to person, place, and time. Mental status is at baseline.     Gait: Gait abnormal.     Comments: Ambulates with walker.   Psychiatric:        Mood and  Affect: Mood normal.        Behavior: Behavior normal.        Thought Content: Thought content normal.     Labs reviewed: Recent Labs    01/20/24 1346 02/23/24 0000 08/01/24 1043 08/01/24 1847  NA 137 140 139  --   K 3.7 4.1 4.0  --   CL 99 105 102  --   CO2 26 23*  --   --   GLUCOSE 283*  --  209*  --   BUN 22 21 27*  --   CREATININE 0.85 0.9 1.20* 1.46*  CALCIUM  9.0 9.0  --   --    No results for input(s): AST, ALT, ALKPHOS, BILITOT, PROT, ALBUMIN in the last 8760 hours.  Recent Labs    01/20/24 1346 08/01/24 1043 08/01/24 1847  WBC 6.8  --  7.3  NEUTROABS 5.6  --   --   HGB 14.0 11.2* 10.8*  HCT 43.7 33.0* 32.2*  MCV 97.5  --  95.5  PLT 157  --  197   Lab Results  Component Value Date   TSH 1.93 02/25/2023   Lab Results  Component Value Date   HGBA1C 8.0 (H) 08/02/2024   Lab Results  Component Value Date   CHOL 116 02/25/2023   HDL 35 02/25/2023   LDLCALC 58 02/25/2023   TRIG 144 02/25/2023   CHOLHDL 2.7 11/10/2020    Significant Diagnostic Results in last 30 days:  No results found.  Assessment/Plan  SYSTOLIC HEART FAILURE, CHRONIC more swelling in BLE Continue furosemide  and potassium supplement in a.m. adding furosemide  20 mg at 2 PM daily Updated BMP, BMP 1 week  HTN (hypertension) Blood pressure is controlled  taking Metoprolol , Olmesartan   Osteopenia after menopause  03/03/23 DEXA t score -1.236, on Ca, Vit D, on Fosamax  70mg  qwk   Vitamin B12 deficiency takes Vit B12  Vitamin D  deficiency Continue vitamin D  supplement  Type 2 diabetes mellitus with renal complication (HCC) T2DM/renal complication, basal and bolus insulin , creat 1.46 08/01/24 Hgb>>0.75 09/04/24,  A1c 8.0 08/02/24  Open wound of right ankle Hospitalized 08/2024 for infected hardware, removal hardware, I+D of deep abscess, medical R ankle I+D site is non healing, no s/s of infection.   R ankle fx, ORIF 10/2023   Family/ staff Communication: Plan of care  reviewed with the patient and the charge nurse  Labs/tests ordered: BMP, BNP in 1 week

## 2024-10-05 NOTE — Assessment & Plan Note (Signed)
 Blood pressure is controlled  taking Metoprolol , Olmesartan 

## 2024-10-05 NOTE — Assessment & Plan Note (Signed)
 more swelling in BLE Continue furosemide  and potassium supplement in a.m. adding furosemide  20 mg at 2 PM daily Updated BMP, BMP 1 week

## 2024-10-05 NOTE — Assessment & Plan Note (Signed)
 03/03/23 DEXA t score -1.236, on Ca, Vit D, on Fosamax  70mg  qwk

## 2024-10-05 NOTE — Assessment & Plan Note (Signed)
 Continue vitamin D supplement

## 2024-10-08 ENCOUNTER — Other Ambulatory Visit: Payer: Self-pay | Admitting: Nurse Practitioner

## 2024-10-11 DIAGNOSIS — I5022 Chronic systolic (congestive) heart failure: Secondary | ICD-10-CM | POA: Diagnosis not present

## 2024-10-12 ENCOUNTER — Ambulatory Visit: Admitting: Family

## 2024-10-12 ENCOUNTER — Other Ambulatory Visit: Payer: Self-pay | Admitting: Nurse Practitioner

## 2024-10-12 ENCOUNTER — Telehealth: Payer: Self-pay | Admitting: Orthopedic Surgery

## 2024-10-12 DIAGNOSIS — T847XXD Infection and inflammatory reaction due to other internal orthopedic prosthetic devices, implants and grafts, subsequent encounter: Secondary | ICD-10-CM

## 2024-10-12 DIAGNOSIS — T8130XA Disruption of wound, unspecified, initial encounter: Secondary | ICD-10-CM

## 2024-10-12 DIAGNOSIS — Z9889 Other specified postprocedural states: Secondary | ICD-10-CM

## 2024-10-12 DIAGNOSIS — Z8781 Personal history of (healed) traumatic fracture: Secondary | ICD-10-CM

## 2024-10-12 NOTE — Telephone Encounter (Signed)
 [name] from [facility] saying that the orders that were given were incorrect and in the wrong form. She says she needs it in order form. Call back number is [phone] ext [phone]

## 2024-10-12 NOTE — Progress Notes (Unsigned)
 Post-Op Visit Note   Patient: Grace Robinson           Date of Birth: Jun 17, 1926           MRN: [id number] Visit Date: 10/12/2024 PCP: Mast, Man X, NP  Chief Complaint:  Chief Complaint  Patient presents with   Right Ankle - Routine Post Op    08/01/2024 right ankle removal HDW    HPI:  HPI The patient is a 89 year old woman seen status post right ankle hardware removal October 10 unfortunately had dehiscence of her lateral ankle  incision Ortho Exam On examination right ankle laterally there is area of dehiscence which now measures 2.6 cm x 5 mm with 4 mm of depth this is filled in with about 90% fibrinous exudative tissue there is no surrounding erythema or maceration  Visit Diagnoses: No diagnosis found.  Plan: Instructions provided to clease daily with Vashe. Pack wound with silvercel or vashe to dry dressing. Follow up in office in 3 weeks with Dr Harden.  Follow-Up Instructions: No follow-ups on file.   Imaging: No results found.  Orders:  No orders of the defined types were placed in this encounter.  No orders of the defined types were placed in this encounter.    PMFS History: Patient Active Problem List   Diagnosis Date Noted   Bone infection, ankle/foot (HCC) 08/01/2024   Hardware complicating wound infection 08/01/2024   Lower limb ulcer, ankle, right, with necrosis of bone (HCC) 08/01/2024   Infection 08/01/2024   Numbness of right hand 11/03/2023   Right calf pain 10/10/2023   Cellulitis 09/26/2023   IBS (irritable bowel syndrome) 09/10/2023   Open wound of right ankle 09/09/2023   Hypokalemia 09/08/2023   Dislocation of ankle, right, open, initial encounter 09/07/2023   Open fracture of right distal fibula 09/06/2023   COVID-19 virus infection 06/09/2023   Gait abnormality 05/19/2023   Osteoarthritis 05/03/2023   Radiation dermatitis 05/03/2023   Osteopenia after menopause 03/04/2023   Closed pelvic fracture (HCC) 02/23/2023   Lower back pain  02/23/2023   Hydronephrosis 02/23/2023   Sigmoid diverticulitis 02/23/2023   Aortic atherosclerosis 02/23/2023   Hyperlipidemia associated with type 2 diabetes mellitus (HCC) 02/23/2023   Vitamin B12 deficiency 02/23/2023   Vitamin D  deficiency 02/23/2023   Type 2 diabetes mellitus with renal complication (HCC) 02/23/2023   S/P lumpectomy, right breast 01/05/2023   Ductal carcinoma in situ (DCIS) of right breast 12/06/2022   NICM (nonischemic cardiomyopathy) (HCC) 07/30/2021   Degenerative tear of posterior horn of medial meniscus of left knee 04/14/2016   Tear of lateral meniscus of left knee 04/14/2016   Synovial plica of left knee 04/14/2016   Mechanical complication of implantable cardioverter-defibrillator (ICD) 01/17/2014   Fall 07/10/2013   Paroxysmal ventricular tachycardia (HCC) 12/13/2012   Biventricular implantable cardioverter-defibrillator -Medtronic 12/08/2011   Atrial tachycardia 12/08/2011   HTN (hypertension) 05/19/2011   CARDIOMYOPATHY, PRIMARY, DILATED 12/01/2010   SYSTOLIC HEART FAILURE, CHRONIC 12/01/2010   Past Medical History:  Diagnosis Date   Biventricular ICD (implantable cardiac defibrillator) in place    downgraded from CRT-D to CRT-P 08/30/22   Breast cancer (HCC)    Cellulitis    Change in bowel habits    CHF (congestive heart failure) (HCC)    Colon polyps    Diabetes mellitus    Type II   Diarrhea    Dyslipidemia    HOH (hard of hearing)    left   HTN (hypertension)  LBBB (left bundle branch block)    chronic   Meniere's disease    Nonischemic cardiomyopathy (HCC) 2006   Presence of permanent cardiac pacemaker    T wave oversensing 01/17/2014   Ulcerative colitis    Varicose veins    4 lazer  treatment each leg    Family History  Problem Relation Age of Onset   Breast cancer Mother 36       met to lungs   Emphysema Father 11   Heart disease Father    Healthy Child    Lung cancer Cousin        maternal first cousin, she smoked    Colon cancer Neg Hx    Rectal cancer Neg Hx    Stomach cancer Neg Hx    Liver cancer Neg Hx    Esophageal cancer Neg Hx     Past Surgical History:  Procedure Laterality Date   ABDOMINAL HYSTERECTOMY     BIV PACEMAKER GENERATOR CHANGEOUT N/A 08/30/2022   Procedure: BIV PACEMAKER GENERATOR CHANGEOUT;  Surgeon: Fernande Elspeth BROCKS, MD;  Location: [facility];  Service: Cardiovascular;  Laterality: N/A;   BREAST LUMPECTOMY WITH RADIOACTIVE SEED LOCALIZATION Right 01/05/2023   Procedure: RIGHT BREAST BRACKETED LUMPECTOMY WITH RADIOACTIVE SEED LOCALIZATION;  Surgeon: Vernetta Berg, MD;  Location: [facility] OR;  Service: General;  Laterality: Right;   CARDIAC CATHETERIZATION  08/2011   CAST APPLICATION Right 09/06/2023   Procedure: CAST APPLICATION;  Surgeon: Jerri Kay HERO, MD;  Location: [facility] OR;  Service: Orthopedics;  Laterality: Right;   CHOLECYSTECTOMY     COLONOSCOPY     EP Study  08/22/08   HARDWARE REMOVAL Right 08/01/2024   Procedure: REMOVAL, HARDWARE;  Surgeon: Harden Jerona GAILS, MD;  Location[facility] OR;  Service: Orthopedics;  Laterality: Right;  RIGHT ANKLE REMOVAL OF RETAINED HARDWARE   I & D EXTREMITY Right 09/06/2023   Procedure: IRRIGATION AND DEBRIDEMENT RIGHT ANKLE;  Surgeon: Jerri Kay HERO, MD;  Location: [facility] OR;  Service: Orthopedics;  Laterality: Right;   IMPLANTABLE CARDIOVERTER DEFIBRILLATOR GENERATOR CHANGE N/A 08/22/2013   Procedure: IMPLANTABLE CARDIOVERTER DEFIBRILLATOR GENERATOR CHANGE;  Surgeon: Elspeth BROCKS Fernande, MD;  Location: [facility] CATH LAB;  Service: Cardiovascular;  Laterality: N/A;   INCISION AND DRAINAGE OF DEEP ABSCESS, CALF Right 08/01/2024   Procedure: INCISION AND DRAINAGE OF DEEP ABSCESS, CALF;  Surgeon: Harden Jerona GAILS, MD;  Location: [facility] OR;  Service: Orthopedics;  Laterality: Right;  IRRIGATION AND DEBRIDEMENT RIGHT ANKLE   KNEE ARTHROSCOPY Left 04/14/2016   Procedure: LEFT KNEE ARTHROSCOPY, PARTIAL MEDIAL AND PARTIAL LATERAL MENISCECTOMY, MEDIAL PLICA;  Surgeon: Norleen Gavel, MD;  Location: [facility] OR;  Service: Orthopedics;  Laterality: Left;   mastoid sugery     in the setting of Meniere's disease   ORIF ANKLE FRACTURE Right 09/09/2023   Procedure: OPEN REDUCTION INTERNAL FIXATION (ORIF) ANKLE FRACTURE;  Surgeon: Harden Jerona GAILS, MD;  Location: [place] OR;  Service: Orthopedics;  Laterality: Right;   TONSILLECTOMY AND ADENOIDECTOMY     Social History   Occupational History   Occupation: retired    Comment: runner, 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

## 2024-10-15 ENCOUNTER — Ambulatory Visit: Attending: Cardiology

## 2024-10-16 NOTE — Telephone Encounter (Signed)
 Im having tammy fax an order

## 2024-10-17 ENCOUNTER — Telehealth: Payer: Self-pay

## 2024-10-17 NOTE — Progress Notes (Signed)
 EPIC Encounter for ICM Monitoring  Patient Name: Grace Robinson is a 89 y.o. female Date: 10/17/2024 Primary Care Physican: Mast, Man X, NP Primary Cardiologist: Camnitz Electrophysiologist: Camnitz Bi-V Pacing:  97.8%     12/24/2022 Weight: 130 lbs 02/16/2024 Office Weight: 126 lbs 03/22/2024 Weight: 127 lbs per facility note 08/06/2024 Weight: 133 lbs per facility note 09/24/2024 Weight: 136 lbs   Clinical Status  Since 24-Sep-2024 Time in AT/AF  <0.1 hr/day (<0.1%)         Spoke with patient and heart failure questions reviewed.  Transmission results reviewed.  Pt reports ongoing swelling of her feet.  Weight increase 3 lbs from baseline.        Currently resides in ALF [facility] (nurses desk number [phone] and fax orders [phone]).  Facility Physician appointments are scheduled on as needed basis.    Since 09/17/2024 ICM Remote Transmission: Optivol Thoracic impedance suggesting ongoing fluid accumulation continues (started 07/30/2024 after lasix  dosage decreased on 08/08/2024).   Prescribed:  Furosemide  40 mg take 1 tablet (40 mg total) by mouth twice a day.  09/11/2024 Per [facility] Nurse Tameka, Physician at [facility] decreased Lasix  to 40 mg once a day on 08/08/2024. Potassium 10 mEq Take 2 tablet(s) (20 mEq total) by mouth twice a day.   Labs: 09/04/2024 BNP 319 (scanned on 09/06/2024) 08/01/2024 Creatinine 1.46 (6:47 PM) 08/01/2024 Creatinine 1.20, BUN 27 Potassium 4.0. Sodium 139 (10:43 AM) 02/23/2024 Creatinine 0.9,   BUN 21, Potassium 4.1, Sodium 140 A complete set of results can be found in Results Review.   Recommendations:  Sent to Dr Inocencio for review and recommendations.   Any orders will need to be faxed to [facility] at [phone].   Follow-up plan: ICM clinic phone appointment on 10/15/2024 to recheck fluid levels.   91 day device clinic remote transmission 11/29/2023.      EP/Cardiology Office Visits:  10/24/2024 with [name], NP (6  month).       Copy of ICM check sent to Dr. Inocencio.       Remote monitoring is medically necessary for Heart Failure Management.    Daily Thoracic Impedance ICM trend: 07/16/2024 through 10/15/2024.    12-14 Month Thoracic Impedance ICM trend:     [name], RN 10/17/2024 4:37 PM

## 2024-10-17 NOTE — Telephone Encounter (Signed)
 Remote ICM transmission received.  Attempted call to patient regarding ICM remote transmission and no answer.

## 2024-10-19 NOTE — Progress Notes (Signed)
 Inocencio Soyla Lunger, MD  Grace Robinson, Grace RAMAN, RN; Gretel Maeola CROME, RN Patient needs to increase Lasix  to 40 mg twice daily for 7 days.  Should also take potassium supplementation at 20 meq twice a day.  If she has fluid reaccumulation after that, we will need to move up her next appointment.

## 2024-10-19 NOTE — Progress Notes (Signed)
 Will forward to Dr Carolyn nurse, Maeola Domino, RN to fax order to [facility], fax number [phone] (confirmed with Renea, patient's nurse at [facility]).

## 2024-10-21 ENCOUNTER — Encounter: Payer: Self-pay | Admitting: Family

## 2024-10-22 MED ORDER — FUROSEMIDE 40 MG PO TABS: ORAL_TABLET | ORAL | 3 refills | Status: AC

## 2024-10-22 NOTE — Addendum Note (Signed)
 Addended by: CHAUVIGNE, Everlie Eble on: 10/22/2024 08:28 AM   Modules accepted: Orders

## 2024-10-23 NOTE — Progress Notes (Deleted)
" °  Electrophysiology Office Note:   Date:  10/23/2024  ID:  Grace Robinson, DOB 1926/04/01, MRN [id number]  Primary Cardiologist: None Primary Heart Failure: None Electrophysiologist: Will Gladis Norton, MD  {Click to update primary MD,subspecialty MD or APP then REFRESH:1}    History of Present Illness:   Grace Robinson is a 89 y.o. female with h/o NICM, VT, chronic CHF (systolic), DM, HTN, HLD, LBBB, CRT-D, ulcerative colitis, Meniere's disease, suspected TIA  seen today for routine electrophysiology followup.   Cardiac support monitoring noted increased fluid accumulation and she had pulse diuresis on 10/15/24.  Since last being seen in our clinic the patient reports doing ***.    She*** denies chest pain, palpitations, dyspnea, PND, orthopnea, nausea, vomiting, dizziness, syncope, edema, weight gain, or early satiety.   Review of systems complete and found to be negative unless listed in HPI.   EP Information / Studies Reviewed:    EKG is ordered today. Personal review as below.      ICD Interrogation-  reviewed in detail today,  See PACEART report.  Device History: MDT CRT-D implanted 2003, RV ICD lead implanted 2006, gen change 2014 Has a 6949 lead She has an abandoned RV pacing lead, though Dr. Fernande discusses failed [phone] lead prompting need to pirate  her pacing lead in 2007 Appropriate tx for VT w/ATP > 2019, 2020 2021 BiV CRT-D implanted 08/07/2002, RV ICD lead implanted 2/272006 for NICM. Generator change, CRT-D downgraded to a CRT-P 08/30/22.  History of appropriate therapy: Yes History of AAD therapy: No     Risk Assessment/Calculations:     No BP recorded.  {Refresh Note OR Click here to enter BP  :1}***        Physical Exam:   VS:  There were no vitals taken for this visit.   Wt Readings from Last 3 Encounters:  10/05/24 132 lb 9.6 oz (60.1 kg)  09/19/24 136 lb 3.2 oz (61.8 kg)  08/06/24 133 lb 12.8 oz (60.7 kg)     GEN: Well nourished, well developed in  no acute distress NECK: No JVD; No carotid bruits CARDIAC: {EPRHYTHM:28826}, no murmurs, rubs, gallops RESPIRATORY:  Clear to auscultation without rales, wheezing or rhonchi  ABDOMEN: Soft, non-tender, non-distended EXTREMITIES:  No edema; No deformity   ASSESSMENT AND PLAN:    Chronic Systolic dysfunction s/p Medtronic CRT-P  VT Recovered LVEF -euvolemic on exam*** > recent pulse lasix  from HF monitoring RN  -***% BiV pacing -Stable on an appropriate medical regimen -Normal ICD function -See Pace Art report -No changes today  Hypertension  -well controlled on current regimen ***  SCAF -***% burden  -not on OAC with low burden / consistent way to monitor    Disposition:   Follow up with Dr. Norton {EPFOLLOW LE:71826}   Signed, [name], NP-C, AGACNP-BC [facility] - Electrophysiology  10/23/2024, 12:21 PM  "

## 2024-10-24 ENCOUNTER — Ambulatory Visit: Attending: Cardiology | Admitting: Pulmonary Disease

## 2024-10-24 DIAGNOSIS — I5022 Chronic systolic (congestive) heart failure: Secondary | ICD-10-CM

## 2024-10-24 DIAGNOSIS — Z950 Presence of cardiac pacemaker: Secondary | ICD-10-CM

## 2024-10-24 DIAGNOSIS — I472 Ventricular tachycardia, unspecified: Secondary | ICD-10-CM

## 2024-11-02 ENCOUNTER — Ambulatory Visit: Admitting: Family

## 2024-11-05 ENCOUNTER — Ambulatory Visit: Attending: Cardiology

## 2024-11-05 DIAGNOSIS — Z950 Presence of cardiac pacemaker: Secondary | ICD-10-CM

## 2024-11-05 DIAGNOSIS — I5022 Chronic systolic (congestive) heart failure: Secondary | ICD-10-CM | POA: Diagnosis not present

## 2024-11-09 NOTE — Progress Notes (Signed)
 EPIC Encounter for ICM Monitoring  Patient Name: Grace Robinson is a 89 y.o. female Date: 11/09/2024 Primary Care Physican: Mast, Man X, NP Primary Cardiologist: Camnitz Electrophysiologist: Camnitz Bi-V Pacing:  98.1%     12/24/2022 Weight: 130 lbs 02/16/2024 Office Weight: 126 lbs 03/22/2024 Weight: 127 lbs per facility note 08/06/2024 Weight: 133 lbs per facility note 09/24/2024 Weight: 136 lbs   Clinical Status  Since 15-Oct-2024 Time in AT/AF  0.0 hr/day (0.0%)         Spoke with patient and heart failure questions reviewed.  Transmission results reviewed.  Pt reports ongoing swelling of her feet.          Currently resides in ALF [facility] (nurses desk number [phone] and fax orders [phone]).  Facility Physician appointments are scheduled on as needed basis.    Since 09/17/2024 ICM Remote Transmission: Optivol Thoracic impedance suggesting ongoing fluid accumulation continues.   Prescribed:  Furosemide  40 mg take 1 tablet (40 mg total) by mouth once a day   Potassium 10 mEq Take 2 tablet(s) (20 mEq total) by mouth twice a day.   Labs: 09/04/2024 BNP 319 (scanned on 09/06/2024) 08/01/2024 Creatinine 1.46 (6:47 PM) 08/01/2024 Creatinine 1.20, BUN 27 Potassium 4.0. Sodium 139 (10:43 AM) 02/23/2024 Creatinine 0.9,   BUN 21, Potassium 4.1, Sodium 140 A complete set of results can be found in Results Review.   Recommendations: No changes and encouraged to call if experiencing any fluid symptoms.   Follow-up plan: ICM clinic phone appointment on 12/10/2024.   91 day device clinic remote transmission 11/29/2023.      EP/Cardiology Office Visits: 12/21/2023 with [name], NP (6 month).       Copy of ICM check sent to Dr. Inocencio.         Remote monitoring is medically necessary for Heart Failure Management.    Daily Thoracic Impedance ICM trend: 08/06/2024 through 11/09/2024.    12-14 Month Thoracic Impedance ICM trend:     [name], RN 11/09/2024 4:20 PM

## 2024-11-12 ENCOUNTER — Non-Acute Institutional Stay (SKILLED_NURSING_FACILITY): Payer: Self-pay | Admitting: Nurse Practitioner

## 2024-11-12 ENCOUNTER — Encounter: Payer: Self-pay | Admitting: Nurse Practitioner

## 2024-11-12 DIAGNOSIS — E538 Deficiency of other specified B group vitamins: Secondary | ICD-10-CM

## 2024-11-12 DIAGNOSIS — I10 Essential (primary) hypertension: Secondary | ICD-10-CM | POA: Diagnosis not present

## 2024-11-12 DIAGNOSIS — Z780 Asymptomatic menopausal state: Secondary | ICD-10-CM | POA: Diagnosis not present

## 2024-11-12 DIAGNOSIS — R252 Cramp and spasm: Secondary | ICD-10-CM

## 2024-11-12 DIAGNOSIS — M8580 Other specified disorders of bone density and structure, unspecified site: Secondary | ICD-10-CM | POA: Diagnosis not present

## 2024-11-12 DIAGNOSIS — Z794 Long term (current) use of insulin: Secondary | ICD-10-CM

## 2024-11-12 DIAGNOSIS — E1169 Type 2 diabetes mellitus with other specified complication: Secondary | ICD-10-CM

## 2024-11-12 DIAGNOSIS — E1122 Type 2 diabetes mellitus with diabetic chronic kidney disease: Secondary | ICD-10-CM | POA: Diagnosis not present

## 2024-11-12 DIAGNOSIS — E785 Hyperlipidemia, unspecified: Secondary | ICD-10-CM | POA: Diagnosis not present

## 2024-11-12 DIAGNOSIS — N183 Chronic kidney disease, stage 3 unspecified: Secondary | ICD-10-CM | POA: Diagnosis not present

## 2024-11-12 DIAGNOSIS — K5732 Diverticulitis of large intestine without perforation or abscess without bleeding: Secondary | ICD-10-CM

## 2024-11-12 DIAGNOSIS — I5022 Chronic systolic (congestive) heart failure: Secondary | ICD-10-CM | POA: Diagnosis not present

## 2024-11-12 NOTE — Assessment & Plan Note (Signed)
Aortic atherosclerosis, taking ASA, Atorvastatin HLD, taking Atorvastatin, LDL 58 02/24/23

## 2024-11-12 NOTE — Assessment & Plan Note (Signed)
 Vit B12 deficiency, takes Vit B12, Hgb 10.2 07/31/24

## 2024-11-12 NOTE — Assessment & Plan Note (Signed)
 03/03/23 DEXA t score -1.236, on Ca, Vit D, on Fosamax  70mg  qwk

## 2024-11-12 NOTE — Assessment & Plan Note (Signed)
"    reported the patient's complaints of her fingers on both hands cramping that hardly grip utensils at meals.  The patient stated the nature of the issue comes and goes for several weeks, rubbing her hands together relieves the problem pain.  No focal weakness  Update CBC/diff, CMP/eGFR, therapy to eval and tx.  "

## 2024-11-12 NOTE — Assessment & Plan Note (Signed)
 T2DM/renal complication, basal and bolus insulin , creat 1.46 08/01/24>>0.75 09/04/24, Hgb A1c 8.0 08/02/24

## 2024-11-12 NOTE — Assessment & Plan Note (Signed)
 Blood pressure is loose controlled, Sbp in 140-150s taking Metoprolol , Olmesartan 

## 2024-11-12 NOTE — Progress Notes (Unsigned)
 " Location:   [facility] Room Number: 804 Place of Service:  ALF (13) Provider: Larwance Hitesh Fouche NP  Marquavius Scaife X, NP  Patient Care Team: Julie-Ann Vanmaanen X, NP as PCP - General (Internal Medicine) Inocencio Soyla Lunger, MD as PCP - Electrophysiology (Cardiology) Vernetta Berg, MD as Consulting Physician (General Surgery) Loretha Ash, MD as Consulting Physician (Hematology and Oncology) Dewey Rush, MD as Consulting Physician (Radiation Oncology)  Extended Emergency Contact Information Primary Emergency Contact: Drury Fairy BRAVO Address: [address]          [place], [place] [address] [place] Home Phone: [phone] Mobile Phone: [phone] Relation: Son  Code Status: DNR Goals of care: Advanced Directive information    08/01/2024   10:49 AM  Advanced Directives  Does Patient Have a Medical Advance Directive? No  Would patient like information on creating a medical advance directive? No - Patient declined     Chief Complaint  Patient presents with   Acute Visit    Complaining of hands cramping    HPI:  Pt is a 89 y.o. female seen today for an acute visit for reported the patient's complaints of her fingers on both hands cramping that hardly grip utensils at meals.  The patient stated the nature of the issue comes and goes for several weeks, rubbing her hands together relieves the problem pain.   Hospitalized 08/2024 for infected hardware, removal hardware, I+D of deep abscess, medical R ankle I+D site is non healing, no s/s of infection.              R ankle fx, ORIF 10/2023              Healed multiple fractures sustained from fall 02/18/23: pelvic fractures: nondisplaced fxs of R superior and inferior pubic rami, minimally displaced fx of the inferior R sacral ala extended to the R SIJ. Mild compression fx of superior endplates of T12, L2. Prn Tylenol   Fall, mechanical T2DM/renal complication, basal and bolus insulin , creat 1.46 08/01/24>>0.75  09/04/24, Hgb A1c 8.0 08/02/24 Mild bilateral hydronephrosis, no stones Sigmoid diverticulitis/IBS taking Bentyl , Imodium , on and off diarrhea Aortic atherosclerosis, taking ASA, Atorvastatin  HLD, taking Atorvastatin , LDL 58 02/24/23 Vit B12 deficiency, takes Vit B12, Hgb 10.2 07/31/24 Vit D deficiency, on Vit D, Vit D 41 02/24/23 CHF, chronic mild swelling in BLE, taking Furosemide , BNP 319 09/04/24 HTN, taking Metoprolol , Olmesartan  OP 03/03/23 DEXA t score -1.236, on Ca, Vit D, on Fosamax  70mg  qwk              S/p R breat cancer lumpectomy, f/u surgeon, 03/15/23 cancer center radiation tx             Radiation dermatitis right upper chest/breast, healed.    Past Medical History:  Diagnosis Date   Biventricular ICD (implantable cardiac defibrillator) in place    downgraded from CRT-D to CRT-P 08/30/22   Breast cancer (HCC)    Cellulitis    Change in bowel habits    CHF (congestive heart failure) (HCC)    Colon polyps    Diabetes mellitus    Type II   Diarrhea    Dyslipidemia    HOH (hard of hearing)    left   HTN (hypertension)    LBBB (left bundle branch block)    chronic   Meniere's disease    Nonischemic cardiomyopathy (HCC) 2006   Presence of permanent cardiac pacemaker    T wave oversensing 01/17/2014   Ulcerative colitis  Varicose veins    4 lazer  treatment each leg   Past Surgical History:  Procedure Laterality Date   ABDOMINAL HYSTERECTOMY     BIV PACEMAKER GENERATOR CHANGEOUT N/A 08/30/2022   Procedure: BIV PACEMAKER GENERATOR CHANGEOUT;  Surgeon: Fernande Elspeth BROCKS, MD;  Location: [facility] INVASIVE CV LAB;  Service: Cardiovascular;  Laterality: N/A;   BREAST LUMPECTOMY WITH RADIOACTIVE SEED LOCALIZATION Right 01/05/2023   Procedure: RIGHT BREAST BRACKETED LUMPECTOMY WITH RADIOACTIVE SEED LOCALIZATION;  Surgeon: Vernetta Berg, MD;  Location: [facility] OR;  Service: General;  Laterality: Right;   CARDIAC CATHETERIZATION  08/2011   CAST APPLICATION Right  09/06/2023   Procedure: CAST APPLICATION;  Surgeon: Jerri Kay HERO, MD;  Location: [facility] OR;  Service: Orthopedics;  Laterality: Right;   CHOLECYSTECTOMY     COLONOSCOPY     EP Study  08/22/08   HARDWARE REMOVAL Right 08/01/2024   Procedure: REMOVAL, HARDWARE;  Surgeon: Harden Jerona GAILS, MD;  Location: [facility] OR;  Service: Orthopedics;  Laterality: Right;  RIGHT ANKLE REMOVAL OF RETAINED HARDWARE   I & D EXTREMITY Right 09/06/2023   Procedure: IRRIGATION AND DEBRIDEMENT RIGHT ANKLE;  Surgeon: Jerri Kay HERO, MD;  Location: [facility] OR;  Service: Orthopedics;  Laterality: Right;   IMPLANTABLE CARDIOVERTER DEFIBRILLATOR GENERATOR CHANGE N/A 08/22/2013   Procedure: IMPLANTABLE CARDIOVERTER DEFIBRILLATOR GENERATOR CHANGE;  Surgeon: Elspeth BROCKS Fernande, MD;  Location: [facility] CATH LAB;  Service: Cardiovascular;  Laterality: N/A;   INCISION AND DRAINAGE OF DEEP ABSCESS, CALF Right 08/01/2024   Procedure: INCISION AND DRAINAGE OF DEEP ABSCESS, CALF;  Surgeon: Harden Jerona GAILS, MD;  Location: [facility] OR;  Service: Orthopedics;  Laterality: Right;  IRRIGATION AND DEBRIDEMENT RIGHT ANKLE   KNEE ARTHROSCOPY Left 04/14/2016   Procedure: LEFT KNEE ARTHROSCOPY, PARTIAL MEDIAL AND PARTIAL LATERAL MENISCECTOMY, MEDIAL PLICA;  Surgeon: Norleen Gavel, MD;  Location: [facility] OR;  Service: Orthopedics;  Laterality: Left;   mastoid sugery     in the setting of Meniere's disease   ORIF ANKLE FRACTURE Right 09/09/2023   Procedure: OPEN REDUCTION INTERNAL FIXATION (ORIF) ANKLE FRACTURE;  Surgeon: Harden Jerona GAILS, MD;  Location: [facility] OR;  Service: Orthopedics;  Laterality: Right;   TONSILLECTOMY AND ADENOIDECTOMY      Allergies[1]  Allergies as of 11/12/2024       Reactions   Coreg [carvedilol] Other (See Comments)   Unknown reaction   Firvanq  [vancomycin ] Itching, Other (See Comments)   Red Satonya Lux Syndrome   Lactose Intolerance (gi)    Lanoxin [digoxin] Other (See Comments)   DIG toxicity   Sudafed [pseudoephedrine] Other (See Comments)   Makes  skin feel like its crawling        Medication List        Accurate as of November 12, 2024 11:59 PM. If you have any questions, ask your nurse or doctor.          acetaminophen  500 MG tablet Commonly known as: TYLENOL  Take 500 mg by mouth every 8 (eight) hours as needed for headache.   aspirin  EC 81 MG tablet Take 81 mg by mouth daily.   atorvastatin  40 MG tablet Commonly known as: LIPITOR TAKE 1 TABLET(40 MG) BY MOUTH DAILY   furosemide  40 MG tablet Commonly known as: LASIX  Take 1 tablet (40 mg total) by mouth 2 (two) times daily for 7 days, THEN 1 tablet (40 mg total) daily. Start taking on: October 22, 2024   hydrALAZINE  50 MG tablet Commonly known as: APRESOLINE  Take 50 mg by mouth 2 (two) times  daily.   insulin  aspart 100 UNIT/ML injection Commonly known as: novoLOG  Inject 3 Units into the skin 3 (three) times daily before meals. May give additional units as needed, see separate PRN order.   insulin  aspart 100 UNIT/ML injection Commonly known as: novoLOG  Inject 10 Units into the skin 3 (three) times daily as needed for high blood sugar. Give with regular 3 unit dose if BG > 400   loperamide  2 MG tablet Commonly known as: IMODIUM  A-D Take 2 mg by mouth every 6 (six) hours as needed for diarrhea or loose stools.   metoprolol  succinate 100 MG 24 hr tablet Commonly known as: TOPROL -XL TAKE 1 TABLET(100 MG) BY MOUTH TWICE DAILY What changed: See the new instructions.   olmesartan  40 MG tablet Commonly known as: BENICAR  TAKE 1 TABLET(40 MG) BY MOUTH DAILY   ondansetron  4 MG tablet Commonly known as: ZOFRAN  Take 4 mg by mouth every 6 (six) hours as needed.   polyethylene glycol 17 g packet Commonly known as: MIRALAX  / GLYCOLAX  Take 17 g by mouth daily as needed for moderate constipation.   potassium chloride  10 MEQ tablet Commonly known as: KLOR-CON  Take 20 mEq by mouth 3 (three) times daily.   risedronate 35 MG tablet Commonly known as:  ACTONEL Take 35 mg by mouth every Sunday. Give 1 tablet by mouth one time a day every Sun for post menopause   saccharomyces boulardii 250 MG capsule Commonly known as: FLORASTOR Take 250 mg by mouth daily.   sulfamethoxazole -trimethoprim  800-160 MG tablet Commonly known as: BACTRIM  DS Take 1 tablet by mouth 2 (two) times daily.   sulfamethoxazole -trimethoprim  800-160 MG tablet Commonly known as: BACTRIM  DS Take 1 tablet by mouth 2 (two) times daily.   Toujeo  Max SoloStar 300 UNIT/ML Solostar Pen Generic drug: insulin  glargine (2 Unit Dial ) ADMINISTER 35 UNITS UNDER THE SKIN DAILY What changed: See the new instructions.   Voltaren  Arthritis Pain 1 % Gel Generic drug: diclofenac  Sodium Apply topically every 8 (eight) hours as needed (Apply to bilateral knees).        Review of Systems  Constitutional:  Negative for appetite change, fatigue and fever.  HENT:  Positive for hearing loss. Negative for congestion and trouble swallowing.   Eyes:  Negative for visual disturbance.  Respiratory:  Negative for cough, chest tightness and wheezing.   Cardiovascular:  Positive for leg swelling.  Gastrointestinal:  Negative for abdominal pain, blood in stool and diarrhea.  Genitourinary:  Negative for dysuria and urgency.  Musculoskeletal:  Positive for arthralgias, back pain and gait problem.       Right lower leg pain occasionally Chronic R+L knee pain, ambulates with walker, s/p inj Cramping of hands.   Skin:  Positive for wound.  Neurological:  Negative for tremors and headaches.  Psychiatric/Behavioral:  Negative for behavioral problems and sleep disturbance. The patient is not nervous/anxious.     Immunization History  Administered Date(s) Administered   INFLUENZA, HIGH DOSE SEASONAL PF 10/06/2016, 10/05/2017, 09/14/2018, 08/17/2022, 08/31/2023   Influenza, Quadrivalent, Recombinant, Inj, Pf 08/01/2020   Influenza,inj,Quad PF,6+ Mos 10/02/2014   Moderna Covid-19 Vaccine  Bivalent Booster 28yrs & up 08/17/2023   PNEUMOCOCCAL CONJUGATE-20 03/16/2023   PPD Test 02/22/2023   Pneumococcal-Unspecified 03/29/2022   Tdap 09/29/2022, 09/06/2023   Unspecified SARS-COV-2 Vaccination 11/05/2019, 12/03/2019, 03/31/2021, 07/21/2021, 03/19/2022   Zoster Recombinant(Shingrix) 03/07/2023   Pertinent  Health Maintenance Due  Topic Date Due   Mammogram  Never done   Influenza Vaccine  06/01/2024   OPHTHALMOLOGY  EXAM  07/04/2024   HEMOGLOBIN A1C  01/31/2025   FOOT EXAM  04/18/2025   Bone Density Scan  Completed      05/19/2023    2:37 PM 06/02/2023    2:49 PM 06/09/2023    8:48 AM 07/08/2023   10:35 AM 07/29/2023    9:55 AM  Fall Risk  Falls in the past year? 1 1 1 1  0  Was there an injury with Fall? 1  1  1  1   0   Fall Risk Category Calculator 3 3 3 3  0  Patient at Risk for Falls Due to  Impaired balance/gait;History of fall(s) History of fall(s);Impaired balance/gait History of fall(s);Impaired balance/gait;Impaired mobility History of fall(s);Impaired balance/gait;Impaired mobility  Fall risk Follow up Falls evaluation completed Falls evaluation completed Falls evaluation completed Falls evaluation completed Falls evaluation completed     Data saved with a previous flowsheet row definition   Functional Status Survey:    Vitals:   11/12/24 1318 11/13/24 1238  BP: (!) 150/79 (!) 150/79  Pulse: 73   Resp: 16   Temp: (!) 97.4 F (36.3 C)   SpO2: 98%   Weight: 134 lb 12.8 oz (61.1 kg)    Body mass index is 22.43 kg/m. Physical Exam Vitals and nursing note reviewed.  Constitutional:      Appearance: Normal appearance.  HENT:     Head: Normocephalic and atraumatic.     Nose: Nose normal.     Mouth/Throat:     Mouth: Mucous membranes are moist.  Eyes:     Extraocular Movements: Extraocular movements intact.     Conjunctiva/sclera: Conjunctivae normal.     Pupils: Pupils are equal, round, and reactive to light.  Cardiovascular:     Rate and  Rhythm: Normal rate and regular rhythm.     Heart sounds: No murmur heard.    Comments: Pacemaker left upper chest(defibrillator was removed per patient's request in the past) No dorsalis pedis pulses or posterior tibial pulses felt.  Pulmonary:     Effort: Pulmonary effort is normal.     Breath sounds: No rales.  Abdominal:     General: Bowel sounds are normal.     Palpations: Abdomen is soft.     Tenderness: There is no abdominal tenderness.  Musculoskeletal:     Cervical back: Normal range of motion and neck supple.     Right lower leg: Edema present.     Left lower leg: Edema present.     Comments: Lower back, pelvic/thigh pain with movement/weight bearing.  Edema 1+ bLE Lateral R ankle non healing surgical wound, no s/s of infection  Skin:    General: Skin is warm and dry.     Findings: Erythema present.     Comments: S/p R breat cancer lumpectomy, no apparent edema R arm.  Open wound lateral R ankle where had I+D and hardware removal, no s/s of infection presently.     Neurological:     General: No focal deficit present.     Mental Status: She is alert and oriented to person, place, and time. Mental status is at baseline.     Motor: No weakness.     Coordination: Coordination normal.     Gait: Gait abnormal.     Deep Tendon Reflexes: Reflexes normal.     Comments: Ambulates with walker.   Psychiatric:        Mood and Affect: Mood normal.        Behavior: Behavior normal.  Thought Content: Thought content normal.     Labs reviewed: Recent Labs    01/20/24 1346 02/23/24 0000 08/01/24 1043 08/01/24 1847  NA 137 140 139  --   K 3.7 4.1 4.0  --   CL 99 105 102  --   CO2 26 23*  --   --   GLUCOSE 283*  --  209*  --   BUN 22 21 27*  --   CREATININE 0.85 0.9 1.20* 1.46*  CALCIUM  9.0 9.0  --   --    No results for input(s): AST, ALT, ALKPHOS, BILITOT, PROT, ALBUMIN in the last 8760 hours. Recent Labs    01/20/24 1346 08/01/24 1043  08/01/24 1847  WBC 6.8  --  7.3  NEUTROABS 5.6  --   --   HGB 14.0 11.2* 10.8*  HCT 43.7 33.0* 32.2*  MCV 97.5  --  95.5  PLT 157  --  197   Lab Results  Component Value Date   TSH 1.93 02/25/2023   Lab Results  Component Value Date   HGBA1C 8.0 (H) 08/02/2024   Lab Results  Component Value Date   CHOL 116 02/25/2023   HDL 35 02/25/2023   LDLCALC 58 02/25/2023   TRIG 144 02/25/2023   CHOLHDL 2.7 11/10/2020    Significant Diagnostic Results in last 30 days:  No results found.  Assessment/Plan: Cramping of hands   reported the patient's complaints of her fingers on both hands cramping that hardly grip utensils at meals.  The patient stated the nature of the issue comes and goes for several weeks, rubbing her hands together relieves the problem pain.  No focal weakness  Update CBC/diff, CMP/eGFR, therapy to eval and tx.   Type 2 diabetes mellitus with renal complication (HCC) T2DM/renal complication, basal and bolus insulin , creat 1.46 08/01/24>>0.75 09/04/24, Hgb A1c 8.0 08/02/24  Sigmoid diverticulitis Sigmoid diverticulitis/IBS taking Bentyl , Imodium , on and off diarrhea  Hyperlipidemia associated with type 2 diabetes mellitus (HCC) Aortic atherosclerosis, taking ASA, Atorvastatin  HLD, taking Atorvastatin , LDL 58 02/24/23  Vitamin B12 deficiency Vit B12 deficiency, takes Vit B12, Hgb 10.2 07/31/24  SYSTOLIC HEART FAILURE, CHRONIC chronic mild swelling in BLE, taking Furosemide , BNP 319 09/04/24  HTN (hypertension) Blood pressure is loose controlled, Sbp in 140-150s taking Metoprolol , Olmesartan   Osteopenia after menopause  03/03/23 DEXA t score -1.236, on Ca, Vit D, on Fosamax  70mg  qwk     Family/ staff Communication: plan of care reviewed with the patient and charge nurse.   Labs/tests ordered:  CBC/diff, CMP/eGFR       [1] Allergies Allergen Reactions   Coreg [Carvedilol] Other (See Comments)    Unknown reaction   Firvanq  [Vancomycin ] Itching and  Other (See Comments)    Red Zanyla Klebba Syndrome   Lactose Intolerance (Gi)    Lanoxin [Digoxin] Other (See Comments)    DIG toxicity   Sudafed [Pseudoephedrine] Other (See Comments)    Makes skin feel like its crawling   "

## 2024-11-12 NOTE — Assessment & Plan Note (Signed)
 Sigmoid diverticulitis/IBS taking Bentyl , Imodium , on and off diarrhea

## 2024-11-12 NOTE — Assessment & Plan Note (Signed)
 chronic mild swelling in BLE, taking Furosemide , BNP 319 09/04/24

## 2024-11-15 ENCOUNTER — Ambulatory Visit: Admitting: Orthopedic Surgery

## 2024-11-15 DIAGNOSIS — T847XXD Infection and inflammatory reaction due to other internal orthopedic prosthetic devices, implants and grafts, subsequent encounter: Secondary | ICD-10-CM | POA: Diagnosis not present

## 2024-11-19 ENCOUNTER — Encounter: Payer: Self-pay | Admitting: Orthopedic Surgery

## 2024-11-19 NOTE — Progress Notes (Signed)
 "  Office Visit Note   Patient: Grace Robinson           Date of Birth: 1926/01/25           MRN: [id number] Visit Date: 11/15/2024              Requested by: Mast, Man X, NP [address] [place],  [place] [address] PCP: Mast, Man X, NP  Chief Complaint  Patient presents with   Right Ankle - Follow-up    08/01/2024 right ankle removal HDW      HPI: Discussed the use of AI scribe software for clinical note transcription with the patient, who gave verbal consent to proceed.  History of Present Illness Grace Robinson is a 89 year old female three months status post debridement and hardware removal who presents for follow-up of a chronic lower extremity wound.  Three months after debridement and hardware removal for a chronic foot ulcer, she continues to receive daily wound care at her facility. Wound care consists of daily dressing changes with application of a solution, without use of additional topical agents or antibiotics. She reports a small persistent opening in the wound and mild bleeding after debridement, which she understands is expected during healing. She denies fever or systemic symptoms. She notes a small persistent opening in the wound and mild bleeding after debridement, which she understands is expected during healing. She occasionally washes the area with soapy water during showers.  She wears support hose during the day for management of varicose veins, as previously instructed, and does not wear them at night. She has a history of bilateral vein surgery and resumed compression therapy after consultation with a vein specialist. She denies adverse effects from the stockings and inquires about discontinuing their use.  She has a history of bilateral knee arthroplasty and shoulder arthroplasty, and receives periodic intra-articular corticosteroid injections for knee pain. She denies new joint pain or functional limitations since her last visit.     Assessment &  Plan: Visit Diagnoses:  1. Hardware complicating wound infection, subsequent encounter     Plan: Assessment and Plan Assessment & Plan Chronic foot ulcer, post-debridement Three months post-debridement, the ulcer shows healthy granulation and healing without infection. - Debrided wound to expose healthy tissue. - Applied Band-Aid, gauze, and Ace wrap. - Instructed daily wound care: wash, remove debris, apply antibacterial compression sock. - Recommended antibacterial compression socks with nanosilver and nanocopper. - Provided guidance on obtaining compression socks. - Advised follow-up if healing does not progress or concerns arise.  Orthopedic aftercare post hardware removal Three months post hardware removal, wound site healing well with no infection or complications. - Assessed wound site for infection and healing. - Advised monitoring for changes and follow-up as needed.  Varicose veins of lower extremity Varicose veins with prior surgery; compression therapy beneficial for venous return. - Recommended continued use of compression socks (15-20 mmHg). - Provided information on brands and sources for compression socks. - Advised wearing compression socks during the day only.      Follow-Up Instructions: No follow-ups on file.   Ortho Exam  Patient is alert, oriented, no adenopathy, well-dressed, normal affect, normal respiratory effort. Physical Exam EXTREMITIES: Wound with healthy granulation tissue, measuring 2 cm x 0.5 cm x 2 mm. No redness, cellulitis, or signs of infection.      Imaging: No results found. No images are attached to the encounter.  Labs: Lab Results  Component Value Date   HGBA1C 8.0 (H)  08/02/2024   HGBA1C 7.5 02/25/2023   HGBA1C 7.5 (H) 12/31/2022   ESRSEDRATE 48 (H) 09/06/2016   CRP 0.1 (L) 09/06/2016   REPTSTATUS 08/06/2024 FINAL 08/01/2024   GRAMSTAIN  08/01/2024    RARE WBC PRESENT, PREDOMINANTLY PMN RARE GRAM POSITIVE COCCI IN  PAIRS    CULT  08/01/2024    FEW STAPHYLOCOCCUS AUREUS NO ANAEROBES ISOLATED Performed at [facility], [address] [place], [place] [address]    [facility] STAPHYLOCOCCUS AUREUS 08/01/2024     Lab Results  Component Value Date   ALBUMIN 3.3 (A) 10/07/2023   ALBUMIN 3.3 (A) 09/27/2023   ALBUMIN 3.8 02/25/2023    Lab Results  Component Value Date   MG 2.2 09/10/2023   MG 2.1 09/09/2023   MG 2.0 09/08/2023   Lab Results  Component Value Date   VD25OH 41 02/25/2023    No results found for: PREALBUMIN    Latest Ref Rng & Units 08/01/2024    6:47 PM 08/01/2024   10:43 AM 01/20/2024    1:46 PM  CBC EXTENDED  WBC 4.0 - 10.5 K/uL 7.3   6.8   RBC 3.87 - 5.11 MIL/uL 3.37   4.48   Hemoglobin 12.0 - 15.0 g/dL 89.1  88.7  85.9   HCT 36.0 - 46.0 % 32.2  33.0  43.7   Platelets 150 - 400 K/uL 197   157   NEUT# 1.7 - 7.7 K/uL   5.6   Lymph# 0.7 - 4.0 K/uL   0.6      There is no height or weight on file to calculate BMI.  Orders:  No orders of the defined types were placed in this encounter.  No orders of the defined types were placed in this encounter.    Procedures: No procedures performed  Clinical Data: No additional findings.  ROS:  All other systems negative, except as noted in the HPI. Review of Systems  Objective: Vital Signs: There were no vitals taken for this visit.  Specialty Comments:  No specialty comments available.  PMFS History: Patient Active Problem List   Diagnosis Date Noted   Cramping of hands 11/12/2024   Bone infection, ankle/foot (HCC) 08/01/2024   Hardware complicating wound infection 08/01/2024   Lower limb ulcer, ankle, right, with necrosis of bone (HCC) 08/01/2024   Infection 08/01/2024   Numbness of right hand 11/03/2023   Right calf pain 10/10/2023   Cellulitis 09/26/2023   IBS (irritable bowel syndrome) 09/10/2023   Open wound of right ankle 09/09/2023   Hypokalemia 09/08/2023   Dislocation of ankle, right, open,  initial encounter 09/07/2023   Open fracture of right distal fibula 09/06/2023   COVID-19 virus infection 06/09/2023   Gait abnormality 05/19/2023   Osteoarthritis 05/03/2023   Radiation dermatitis 05/03/2023   Osteopenia after menopause 03/04/2023   Closed pelvic fracture (HCC) 02/23/2023   Lower back pain 02/23/2023   Hydronephrosis 02/23/2023   Sigmoid diverticulitis 02/23/2023   Aortic atherosclerosis 02/23/2023   Hyperlipidemia associated with type 2 diabetes mellitus (HCC) 02/23/2023   Vitamin B12 deficiency 02/23/2023   Vitamin D  deficiency 02/23/2023   Type 2 diabetes mellitus with renal complication (HCC) 02/23/2023   S/P lumpectomy, right breast 01/05/2023   Ductal carcinoma in situ (DCIS) of right breast 12/06/2022   NICM (nonischemic cardiomyopathy) (HCC) 07/30/2021   Degenerative tear of posterior horn of medial meniscus of left knee 04/14/2016   Tear of lateral meniscus of left knee 04/14/2016   Synovial plica of left knee  04/14/2016   Mechanical complication of implantable cardioverter-defibrillator (ICD) 01/17/2014   Fall 07/10/2013   Paroxysmal ventricular tachycardia (HCC) 12/13/2012   Biventricular implantable cardioverter-defibrillator -[employer]tr[employer] 12/08/2011   Atrial tachycardia 12/08/2011   HTN (hypertension) 05/19/2011   CARDIOMYOPATHY, PRIMARY, DILATED 12/01/2010   SYSTOLIC HEART FAILURE, CHRONIC 12/01/2010   Past Medical History:  Diagnosis Date   Biventricular ICD (implantable cardiac defibrillator) in place    downgraded from CRT-D to CRT-P 08/30/22   Breast cancer (HCC)    Cellulitis    Change in bowel habits    CHF (congestive heart failure) (HCC)    Colon polyps    Diabetes mellitus    Type II   Diarrhea    Dyslipidemia    HOH (hard of hearing)    left   HTN (hypertension)    LBBB (left bundle branch block)    chronic   Meniere's disease    Nonischemic cardiomyopathy (HCC) 2006   Presence of permanent cardiac pacemaker    T wave  oversensing 01/17/2014   Ulcerative colitis    Varicose veins    4 lazer  treatment each leg    Family History  Problem Relation Age of Onset   Breast cancer Mother 63       met to lungs   Emphysema Father 49   Heart disease Father    Healthy Child    Lung cancer Cousin        maternal first cousin, she smoked   Colon cancer Neg Hx    Rectal cancer Neg Hx    Stomach cancer Neg Hx    Liver cancer Neg Hx    Esophageal cancer Neg Hx     Past Surgical History:  Procedure Laterality Date   ABDOMINAL HYSTERECTOMY     BIV PACEMAKER GENERATOR CHANGEOUT N/A 08/30/2022   Procedure: BIV PACEMAKER GENERATOR CHANGEOUT;  Surgeon: Fernande Elspeth BROCKS, MD;  Location: [facility];  Service: Cardiovascular;  Laterality: N/A;   BREAST LUMPECTOMY WITH RADIOACTIVE SEED LOCALIZATION Right 01/05/2023   Procedure: RIGHT BREAST BRACKETED LUMPECTOMY WITH RADIOACTIVE SEED LOCALIZATION;  Surgeon: Vernetta Berg, MD;  Location: [facility] OR;  Service: General;  Laterality: Right;   CARDIAC CATHETERIZATION  08/2011   CAST APPLICATION Right 09/06/2023   Procedure: CAST APPLICATION;  Surgeon: Jerri Kay HERO, MD;  Location: [facility] OR;  Service: Orthopedics;  Laterality: Right;   CHOLECYSTECTOMY     COLONOSCOPY     EP Study  08/22/08   HARDWARE REMOVAL Right 08/01/2024   Procedure: REMOVAL, HARDWARE;  Surgeon: Harden Jerona GAILS, MD;  Location: [facility] OR;  Service: Orthopedics;  Laterality: Right;  RIGHT ANKLE REMOVAL OF RETAINED HARDWARE   I & D EXTREMITY Right 09/06/2023   Procedure: IRRIGATION AND DEBRIDEMENT RIGHT ANKLE;  Surgeon: Jerri Kay HERO, MD;  Location: [facility] OR;  Service: Orthopedics;  Laterality: Right;   IMPLANTABLE CARDIOVERTER DEFIBRILLATOR GENERATOR CHANGE N/A 08/22/2013   Procedure: IMPLANTABLE CARDIOVERTER DEFIBRILLATOR GENERATOR CHANGE;  Surgeon: Elspeth BROCKS Fernande, MD;  Location: [facility] CATH LAB;  Service: Cardiovascular;  Laterality: N/A;   INCISION AND DRAINAGE OF DEEP ABSCESS, CALF Right 08/01/2024   Procedure:  INCISION AND DRAINAGE OF DEEP ABSCESS, CALF;  Surgeon: Harden Jerona GAILS, MD;  Location: [facility] OR;  Service: Orthopedics;  Laterality: Right;  IRRIGATION AND DEBRIDEMENT RIGHT ANKLE   KNEE ARTHROSCOPY Left 04/14/2016   Procedure: LEFT KNEE ARTHROSCOPY, PARTIAL MEDIAL AND PARTIAL LATERAL MENISCECTOMY, MEDIAL PLICA;  Surgeon: Norleen Gavel, MD;  Location: [facility] OR;  Service: Orthopedics;  Laterality:  Left;   mastoid sugery     in the setting of Meniere's disease   ORIF ANKLE FRACTURE Right 09/09/2023   Procedure: OPEN REDUCTION INTERNAL FIXATION (ORIF) ANKLE FRACTURE;  Surgeon: Harden Jerona GAILS, MD;  Location: [facility] OR;  Service: Orthopedics;  Laterality: Right;   TONSILLECTOMY AND ADENOIDECTOMY     Social History   Occupational History   Occupation: retired    Comment: runner, 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

## 2024-11-28 ENCOUNTER — Ambulatory Visit

## 2024-11-28 DIAGNOSIS — I428 Other cardiomyopathies: Secondary | ICD-10-CM

## 2024-11-28 LAB — CUP PACEART REMOTE DEVICE CHECK
Battery Remaining Longevity: 117 mo
Battery Voltage: 3.01 V
Brady Statistic AP VP Percent: 23.38 %
Brady Statistic AP VS Percent: 0.33 %
Brady Statistic AS VP Percent: 74.54 %
Brady Statistic AS VS Percent: 1.75 %
Brady Statistic RA Percent Paced: 23.97 %
Brady Statistic RV Percent Paced: 0.54 %
Date Time Interrogation Session: 20260128024950
Implantable Lead Connection Status: 753985
Implantable Lead Connection Status: 753985
Implantable Lead Connection Status: 753985
Implantable Lead Connection Status: 753985
Implantable Lead Implant Date: 20031007
Implantable Lead Implant Date: 20031007
Implantable Lead Implant Date: 20031007
Implantable Lead Implant Date: 20060227
Implantable Lead Location: 753858
Implantable Lead Location: 753859
Implantable Lead Location: 753860
Implantable Lead Location: 753860
Implantable Lead Model: 4193
Implantable Lead Model: 4469
Implantable Lead Model: 4470
Implantable Lead Model: 6949
Implantable Lead Serial Number: 337988
Implantable Lead Serial Number: 361236
Implantable Pulse Generator Implant Date: 20231030
Lead Channel Impedance Value: 3363 Ohm
Lead Channel Impedance Value: 3363 Ohm
Lead Channel Impedance Value: 3363 Ohm
Lead Channel Impedance Value: 342 Ohm
Lead Channel Impedance Value: 342 Ohm
Lead Channel Impedance Value: 399 Ohm
Lead Channel Impedance Value: 513 Ohm
Lead Channel Impedance Value: 513 Ohm
Lead Channel Impedance Value: 589 Ohm
Lead Channel Pacing Threshold Amplitude: 0.625 V
Lead Channel Pacing Threshold Amplitude: 1.5 V
Lead Channel Pacing Threshold Pulse Width: 0.4 ms
Lead Channel Pacing Threshold Pulse Width: 0.4 ms
Lead Channel Sensing Intrinsic Amplitude: 1.25 mV
Lead Channel Sensing Intrinsic Amplitude: 1.25 mV
Lead Channel Sensing Intrinsic Amplitude: 31.625 mV
Lead Channel Sensing Intrinsic Amplitude: 31.625 mV
Lead Channel Setting Pacing Amplitude: 1.5 V
Lead Channel Setting Pacing Amplitude: 1.5 V
Lead Channel Setting Pacing Amplitude: 3 V
Lead Channel Setting Pacing Pulse Width: 0.4 ms
Lead Channel Setting Pacing Pulse Width: 0.8 ms
Lead Channel Setting Sensing Sensitivity: 1.2 mV
Zone Setting Status: 755011

## 2024-11-29 ENCOUNTER — Ambulatory Visit: Payer: Self-pay | Admitting: Cardiology

## 2024-11-29 NOTE — Progress Notes (Signed)
 31 day ICM Remote transmission canceled due to [facility] clinic is on hold until further notice.  91 day remote monitoring will continue per protocol.

## 2024-12-06 NOTE — Progress Notes (Signed)
 Remote PPM Transmission

## 2024-12-10 ENCOUNTER — Ambulatory Visit

## 2024-12-20 ENCOUNTER — Ambulatory Visit: Admitting: Pulmonary Disease

## 2025-02-19 ENCOUNTER — Ambulatory Visit: Admitting: Hematology and Oncology

## 2025-02-27 ENCOUNTER — Ambulatory Visit

## 2025-05-29 ENCOUNTER — Ambulatory Visit

## 2025-08-28 ENCOUNTER — Ambulatory Visit

## 2025-11-27 ENCOUNTER — Ambulatory Visit
